# Patient Record
Sex: Male | Born: 1945 | Race: White | Hispanic: No | Smoking: Never smoker
Health system: Southern US, Community
[De-identification: ages and names within clinical notes are randomized; demographics above are authoritative.]

## PROBLEM LIST (undated history)

## (undated) DIAGNOSIS — I2510 Atherosclerotic heart disease of native coronary artery without angina pectoris: Secondary | ICD-10-CM

## (undated) DIAGNOSIS — C801 Malignant (primary) neoplasm, unspecified: Secondary | ICD-10-CM

## (undated) DIAGNOSIS — I219 Acute myocardial infarction, unspecified: Secondary | ICD-10-CM

## (undated) DIAGNOSIS — K279 Peptic ulcer, site unspecified, unspecified as acute or chronic, without hemorrhage or perforation: Secondary | ICD-10-CM

## (undated) DIAGNOSIS — G4731 Primary central sleep apnea: Secondary | ICD-10-CM

## (undated) DIAGNOSIS — I10 Essential (primary) hypertension: Secondary | ICD-10-CM

## (undated) DIAGNOSIS — M1990 Unspecified osteoarthritis, unspecified site: Secondary | ICD-10-CM

## (undated) DIAGNOSIS — T7840XA Allergy, unspecified, initial encounter: Secondary | ICD-10-CM

## (undated) DIAGNOSIS — Z86010 Personal history of colonic polyps: Secondary | ICD-10-CM

## (undated) DIAGNOSIS — I2699 Other pulmonary embolism without acute cor pulmonale: Secondary | ICD-10-CM

## (undated) DIAGNOSIS — K5732 Diverticulitis of large intestine without perforation or abscess without bleeding: Secondary | ICD-10-CM

## (undated) DIAGNOSIS — K219 Gastro-esophageal reflux disease without esophagitis: Secondary | ICD-10-CM

## (undated) DIAGNOSIS — E785 Hyperlipidemia, unspecified: Secondary | ICD-10-CM

## (undated) DIAGNOSIS — F32A Depression, unspecified: Secondary | ICD-10-CM

## (undated) DIAGNOSIS — F329 Major depressive disorder, single episode, unspecified: Secondary | ICD-10-CM

## (undated) DIAGNOSIS — Z860101 Personal history of adenomatous and serrated colon polyps: Secondary | ICD-10-CM

## (undated) HISTORY — DX: Personal history of adenomatous and serrated colon polyps: Z86.0101

## (undated) HISTORY — PX: JOINT REPLACEMENT: SHX530

## (undated) HISTORY — DX: Diverticulitis of large intestine without perforation or abscess without bleeding: K57.32

## (undated) HISTORY — DX: Essential (primary) hypertension: I10

## (undated) HISTORY — DX: Personal history of colonic polyps: Z86.010

## (undated) HISTORY — PX: PROSTATE SURGERY: SHX751

## (undated) HISTORY — DX: Other pulmonary embolism without acute cor pulmonale: I26.99

## (undated) HISTORY — DX: Malignant (primary) neoplasm, unspecified: C80.1

## (undated) HISTORY — DX: Allergy, unspecified, initial encounter: T78.40XA

## (undated) HISTORY — DX: Depression, unspecified: F32.A

## (undated) HISTORY — PX: CARDIAC CATHETERIZATION: SHX172

## (undated) HISTORY — DX: Peptic ulcer, site unspecified, unspecified as acute or chronic, without hemorrhage or perforation: K27.9

## (undated) HISTORY — DX: Acute myocardial infarction, unspecified: I21.9

## (undated) HISTORY — DX: Gastro-esophageal reflux disease without esophagitis: K21.9

## (undated) HISTORY — DX: Major depressive disorder, single episode, unspecified: F32.9

## (undated) HISTORY — PX: VASECTOMY: SHX75

## (undated) HISTORY — DX: Unspecified osteoarthritis, unspecified site: M19.90

## (undated) HISTORY — DX: Hyperlipidemia, unspecified: E78.5

## (undated) HISTORY — PX: HERNIA REPAIR: SHX51

## (undated) HISTORY — PX: UPPER GASTROINTESTINAL ENDOSCOPY: SHX188

## (undated) HISTORY — DX: Primary central sleep apnea: G47.31

---

## 1988-09-05 HISTORY — PX: CHOLECYSTECTOMY: SHX55

## 1998-07-26 ENCOUNTER — Ambulatory Visit: Admission: RE | Admit: 1998-07-26 | Discharge: 1998-07-26 | Payer: Self-pay | Admitting: Family Medicine

## 1998-09-25 ENCOUNTER — Ambulatory Visit: Admission: RE | Admit: 1998-09-25 | Discharge: 1998-09-25 | Payer: Self-pay | Admitting: Internal Medicine

## 1999-04-14 ENCOUNTER — Encounter: Payer: Self-pay | Admitting: Internal Medicine

## 1999-04-14 ENCOUNTER — Inpatient Hospital Stay (HOSPITAL_COMMUNITY): Admission: EM | Admit: 1999-04-14 | Discharge: 1999-04-15 | Payer: Self-pay | Admitting: *Deleted

## 1999-04-29 ENCOUNTER — Ambulatory Visit: Admission: RE | Admit: 1999-04-29 | Discharge: 1999-04-29 | Payer: Self-pay | Admitting: Internal Medicine

## 1999-05-07 DIAGNOSIS — K5732 Diverticulitis of large intestine without perforation or abscess without bleeding: Secondary | ICD-10-CM

## 1999-05-07 HISTORY — DX: Diverticulitis of large intestine without perforation or abscess without bleeding: K57.32

## 1999-05-26 ENCOUNTER — Ambulatory Visit (HOSPITAL_COMMUNITY): Admission: RE | Admit: 1999-05-26 | Discharge: 1999-05-26 | Payer: Self-pay | Admitting: Gastroenterology

## 2000-04-26 ENCOUNTER — Other Ambulatory Visit: Admission: RE | Admit: 2000-04-26 | Discharge: 2000-04-26 | Payer: Self-pay | Admitting: Urology

## 2000-11-21 ENCOUNTER — Encounter: Admission: RE | Admit: 2000-11-21 | Discharge: 2000-11-21 | Payer: Self-pay | Admitting: Internal Medicine

## 2000-11-21 ENCOUNTER — Encounter: Payer: Self-pay | Admitting: Internal Medicine

## 2001-07-17 ENCOUNTER — Encounter: Admission: RE | Admit: 2001-07-17 | Discharge: 2001-07-17 | Payer: Self-pay | Admitting: Rheumatology

## 2001-07-17 ENCOUNTER — Encounter: Payer: Self-pay | Admitting: Rheumatology

## 2002-04-08 ENCOUNTER — Encounter: Payer: Self-pay | Admitting: Orthopedic Surgery

## 2002-04-08 ENCOUNTER — Encounter: Admission: RE | Admit: 2002-04-08 | Discharge: 2002-04-08 | Payer: Self-pay | Admitting: Orthopedic Surgery

## 2003-11-30 ENCOUNTER — Ambulatory Visit (HOSPITAL_BASED_OUTPATIENT_CLINIC_OR_DEPARTMENT_OTHER): Admission: RE | Admit: 2003-11-30 | Discharge: 2003-11-30 | Payer: Self-pay | Admitting: Internal Medicine

## 2004-03-17 ENCOUNTER — Encounter: Admission: RE | Admit: 2004-03-17 | Discharge: 2004-03-17 | Payer: Self-pay | Admitting: *Deleted

## 2004-09-05 HISTORY — PX: OTHER SURGICAL HISTORY: SHX169

## 2005-01-05 ENCOUNTER — Ambulatory Visit: Payer: Self-pay | Admitting: Gastroenterology

## 2005-01-19 ENCOUNTER — Ambulatory Visit: Payer: Self-pay | Admitting: Gastroenterology

## 2006-03-09 ENCOUNTER — Ambulatory Visit (HOSPITAL_COMMUNITY): Admission: RE | Admit: 2006-03-09 | Discharge: 2006-03-09 | Payer: Self-pay | Admitting: Family Medicine

## 2006-03-09 ENCOUNTER — Encounter (INDEPENDENT_AMBULATORY_CARE_PROVIDER_SITE_OTHER): Payer: Self-pay | Admitting: *Deleted

## 2006-03-14 ENCOUNTER — Emergency Department (HOSPITAL_COMMUNITY): Admission: EM | Admit: 2006-03-14 | Discharge: 2006-03-15 | Payer: Self-pay | Admitting: Emergency Medicine

## 2006-11-02 ENCOUNTER — Ambulatory Visit: Payer: Self-pay | Admitting: Gastroenterology

## 2008-03-31 ENCOUNTER — Ambulatory Visit: Payer: Self-pay

## 2008-03-31 ENCOUNTER — Ambulatory Visit: Payer: Self-pay | Admitting: Cardiovascular Disease

## 2008-09-23 ENCOUNTER — Encounter: Payer: Self-pay | Admitting: Surgery

## 2008-09-23 IMAGING — CR DG CHEST 2V
3 series · 3 of 3 positions shown · non-contrast
Comparison: None available.

CLINICAL DATA: Preadmission respiratory film.

CHEST - 2 VIEW

[view not recorded (1 of 3)]
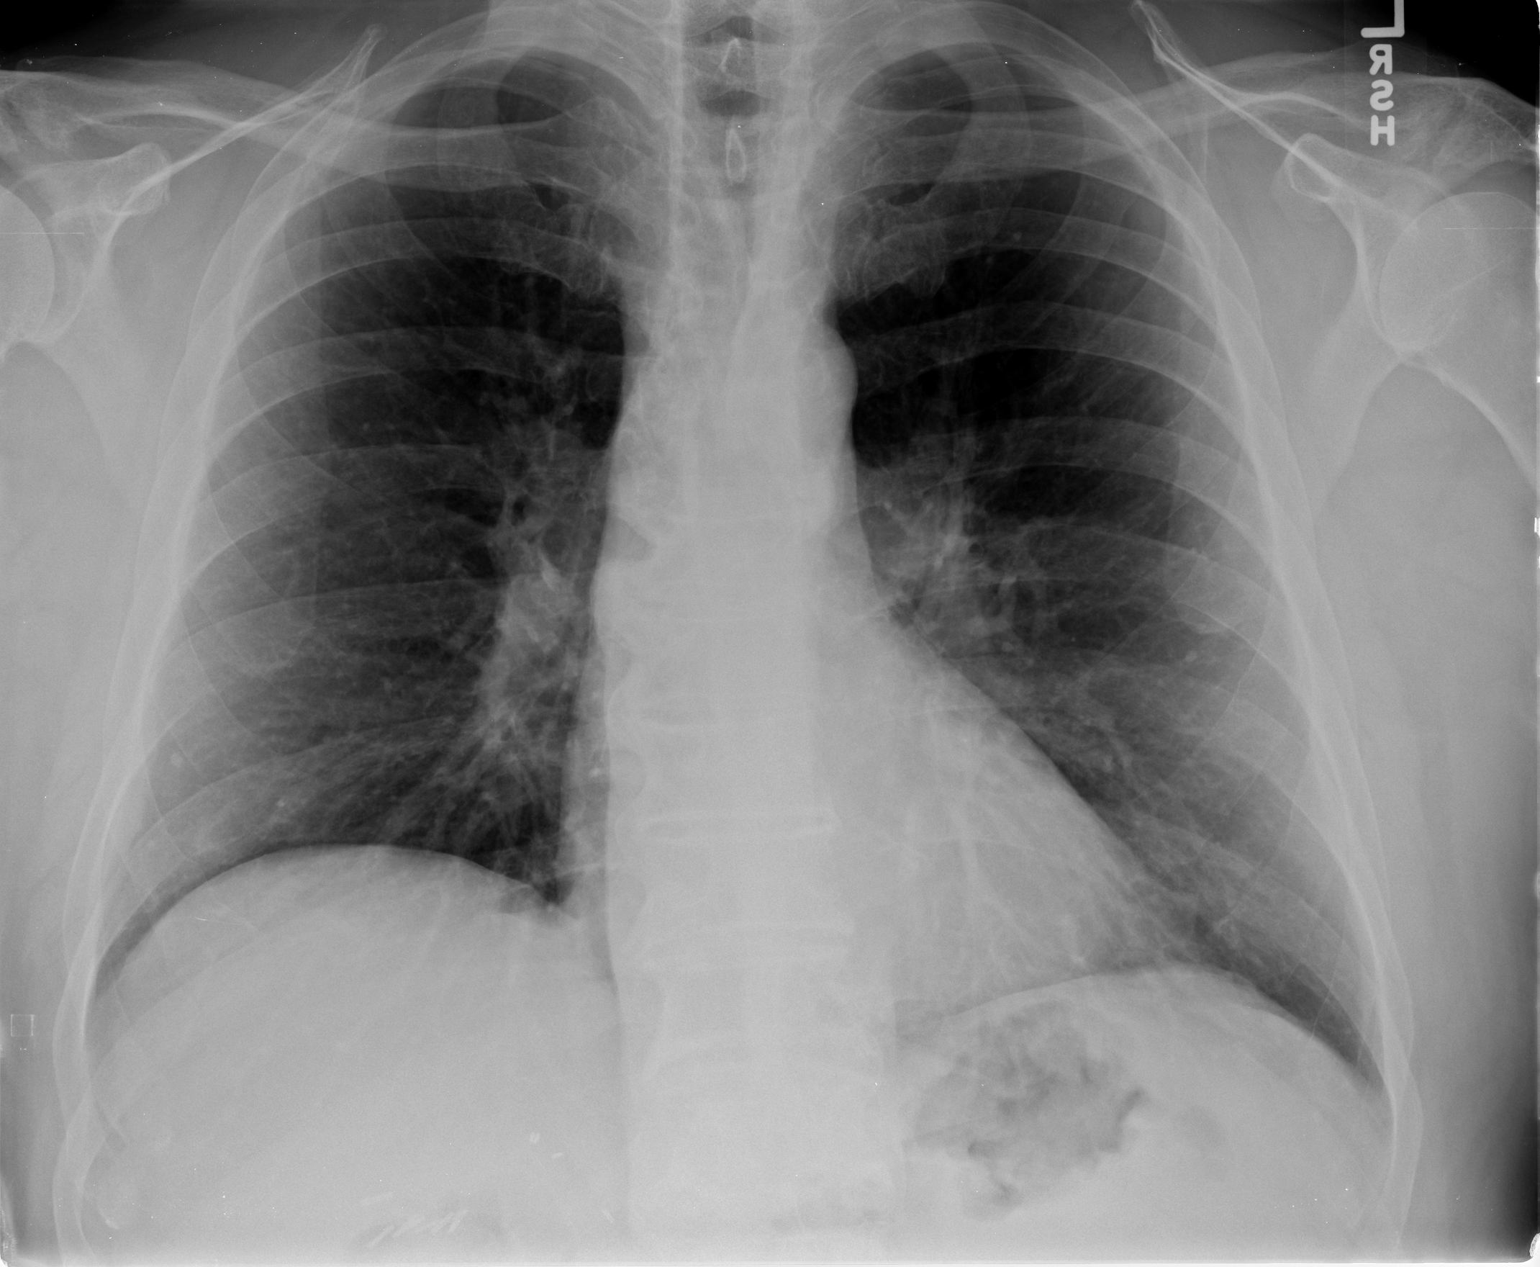

[view not recorded (2 of 3)]
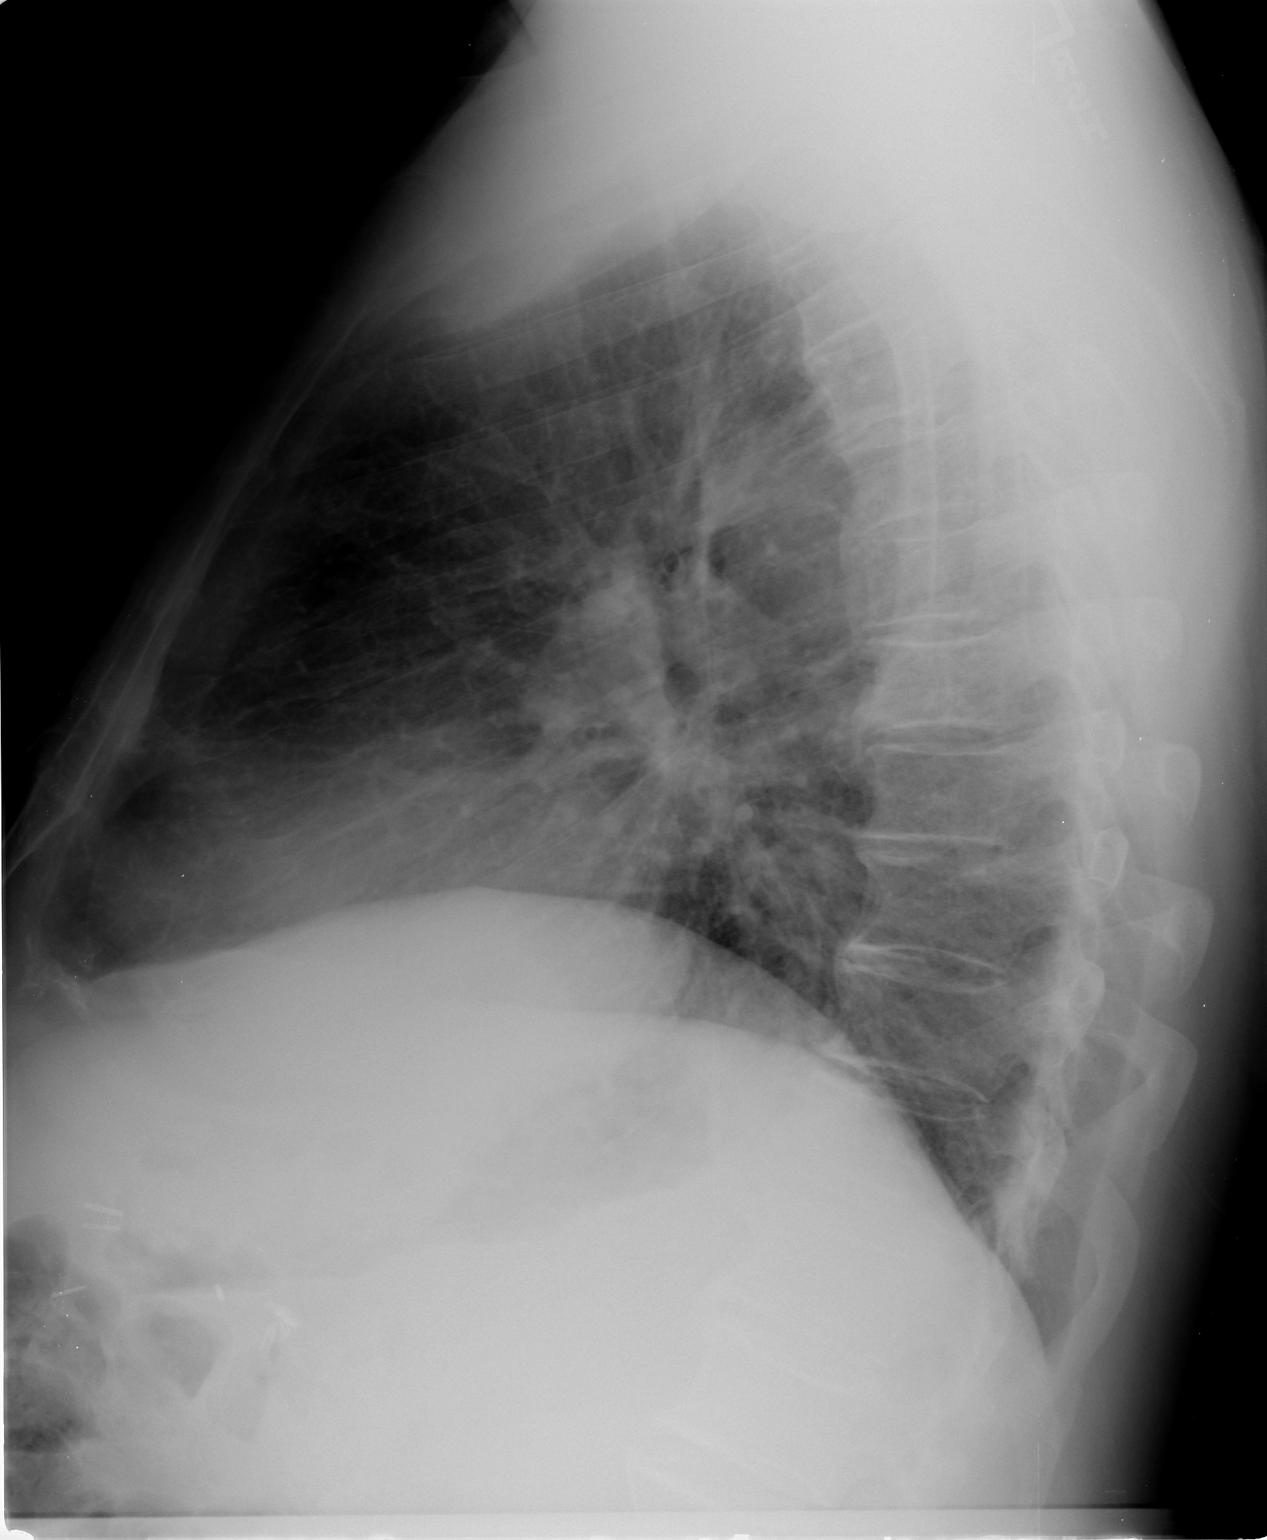

[view not recorded (3 of 3)]
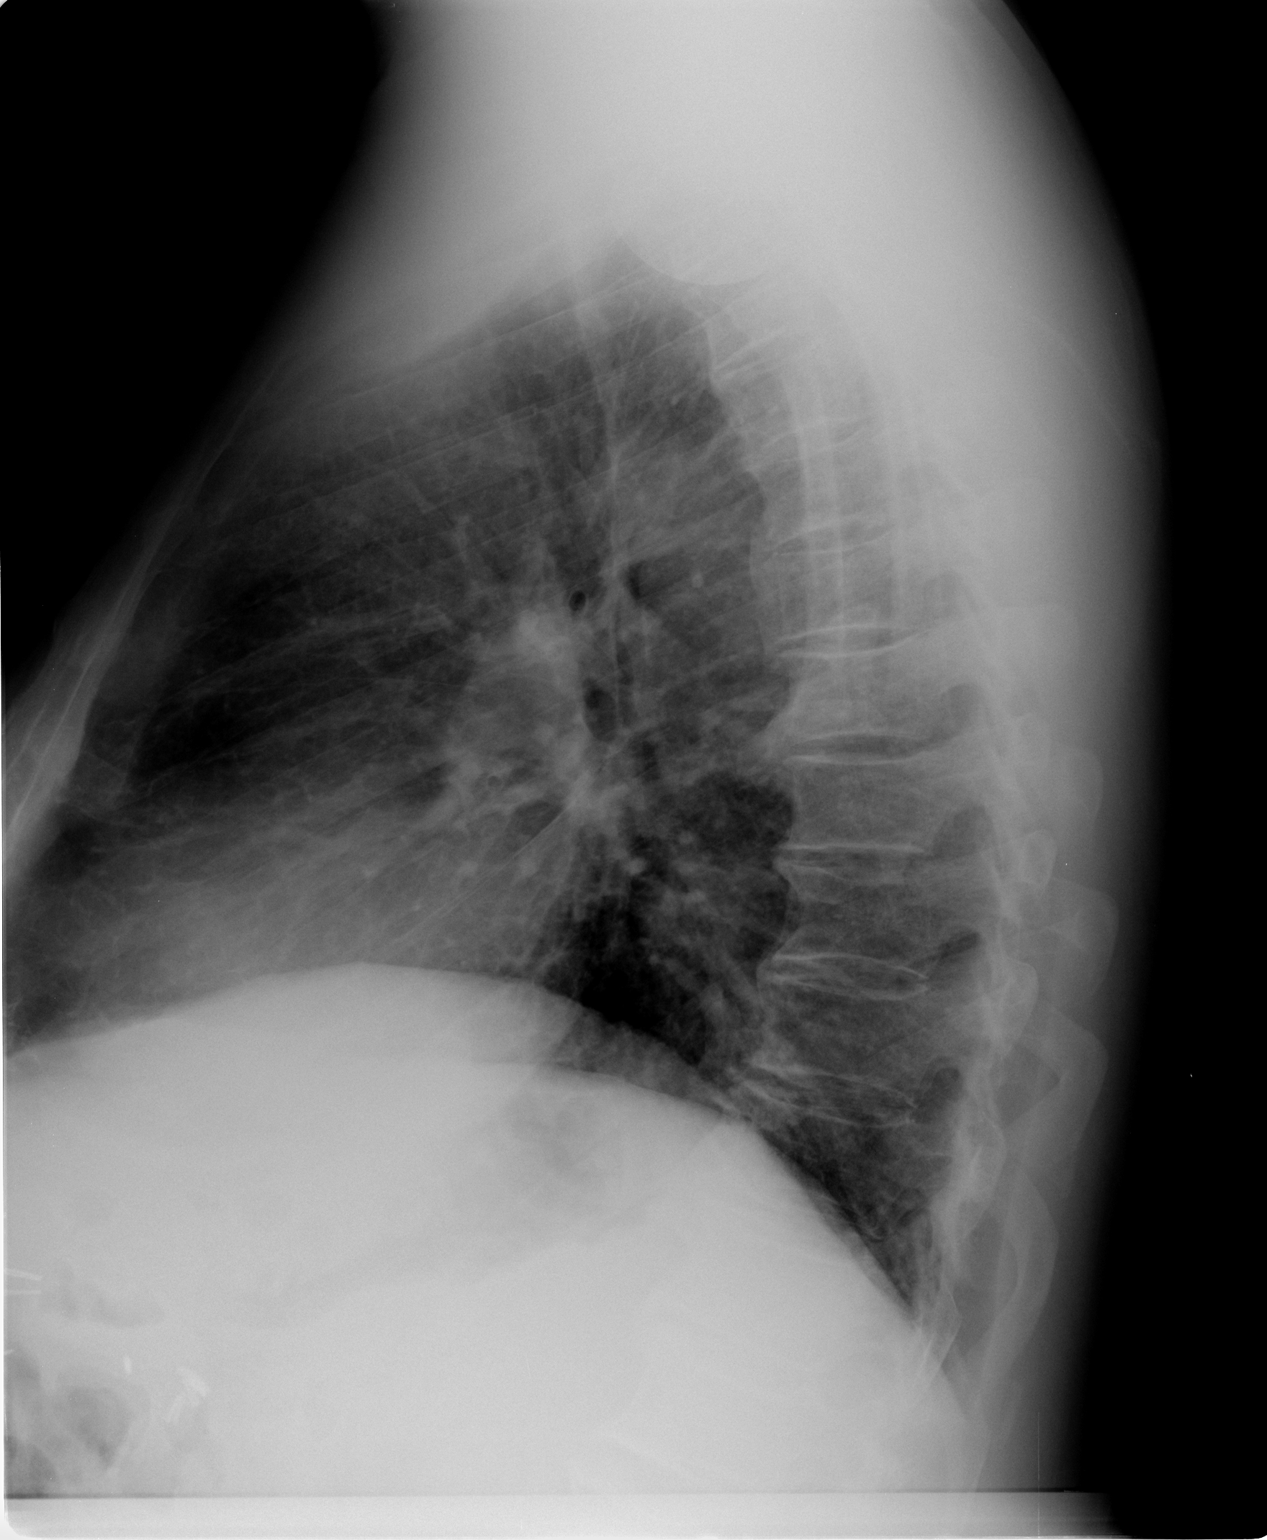

[3 of 3 positions shown; findings below may reference images not displayed]

FINDINGS: Lungs clear.  Heart size normal.  No pleural effusion or
focal bony abnormality.
IMPRESSION: No acute disease.

## 2008-09-24 ENCOUNTER — Ambulatory Visit (HOSPITAL_COMMUNITY): Admission: RE | Admit: 2008-09-24 | Discharge: 2008-09-25 | Payer: Self-pay | Admitting: Surgery

## 2008-09-24 ENCOUNTER — Encounter (INDEPENDENT_AMBULATORY_CARE_PROVIDER_SITE_OTHER): Payer: Self-pay | Admitting: Surgery

## 2008-10-08 ENCOUNTER — Encounter: Admission: RE | Admit: 2008-10-08 | Discharge: 2008-10-08 | Payer: Self-pay | Admitting: Family Medicine

## 2008-10-08 IMAGING — CT CT PARANASAL SINUSES LIMITED
1 series · 14 of 16 positions shown, 18 images · non-contrast
Comparison: None

CLINICAL DATA: Chronic sinusitis.

CT PARANASAL SINUS LIMITED WITHOUT CONTRAST

[Series 2: sinus · axial · 0.22mm/px · z∈[+264,+363]mm · 14 of 16 slices shown, 18 images]
[im 2/16  brain]
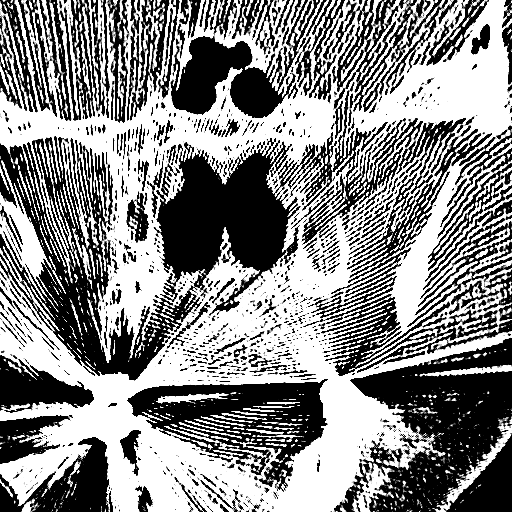
[im 2/16  bone]
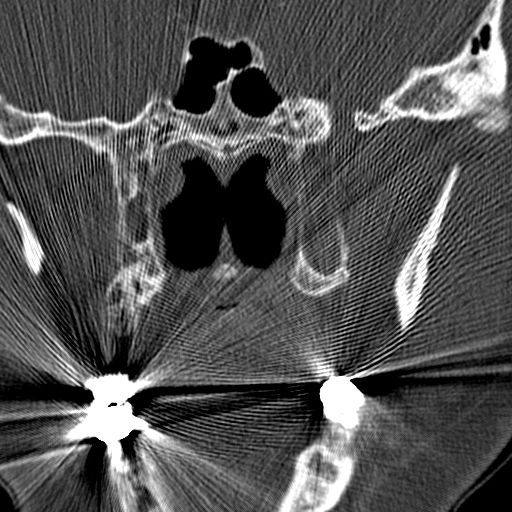
[im 3/16  bone]
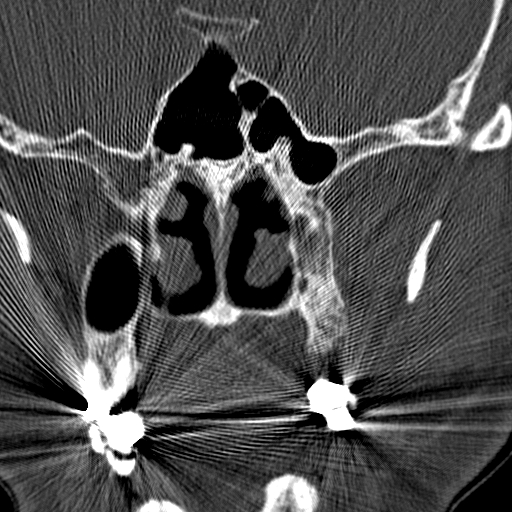
[im 4/16  bone]
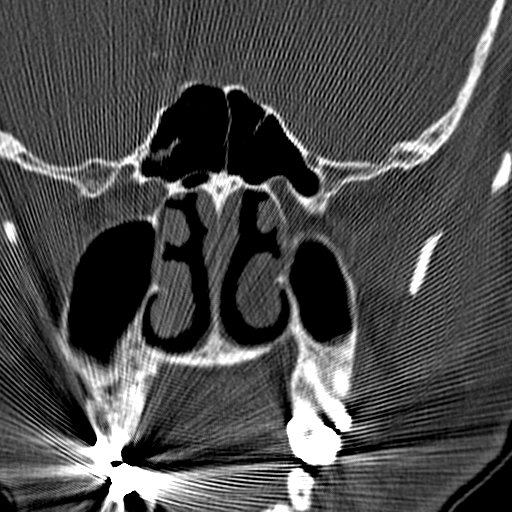
[im 5/16  bone]
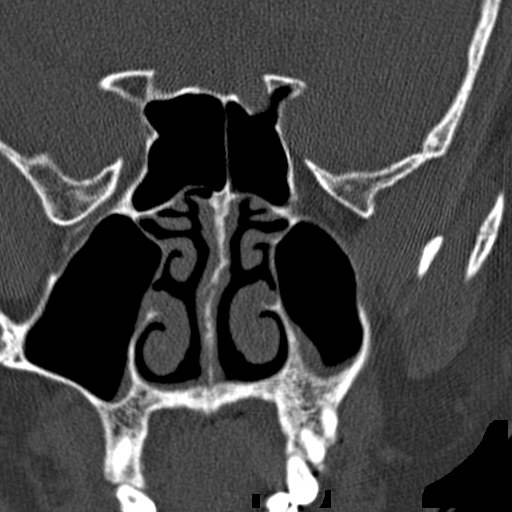
[im 6/16  brain]
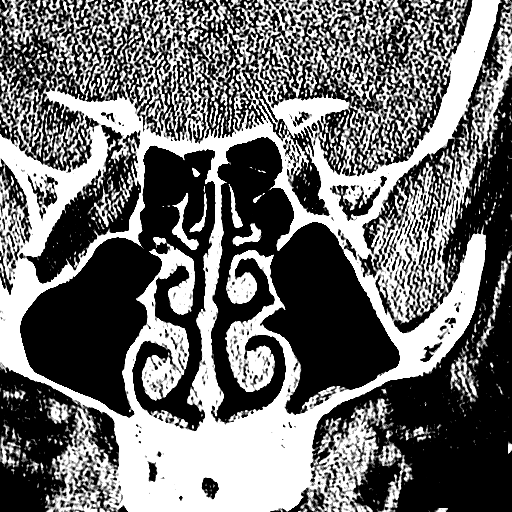
[im 6/16  bone]
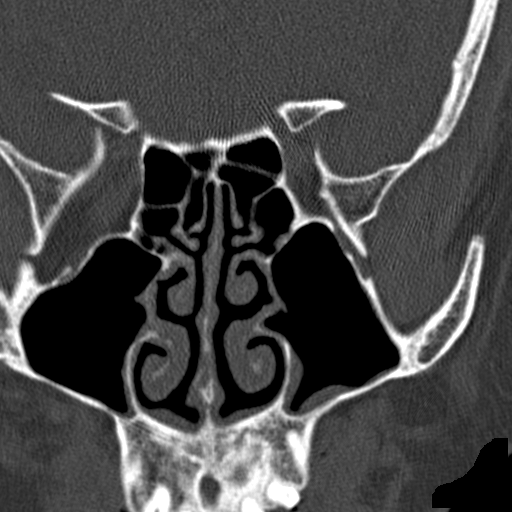
[im 7/16  bone]
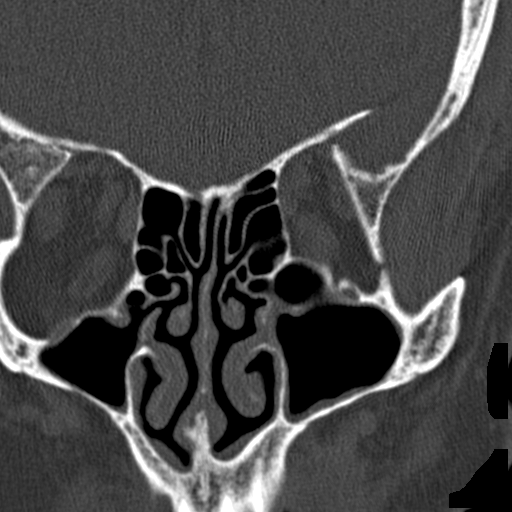
[im 8/16  bone]
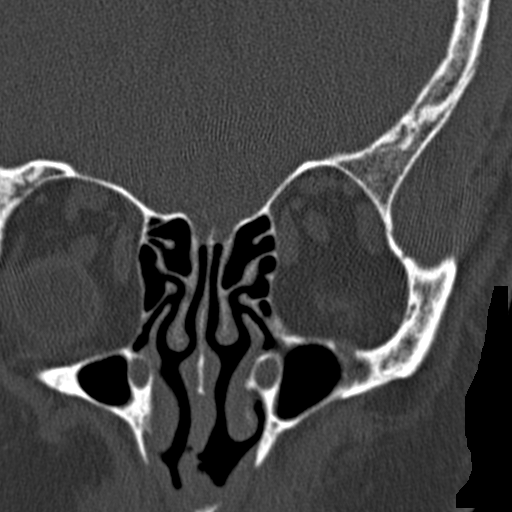
[im 9/16  bone]
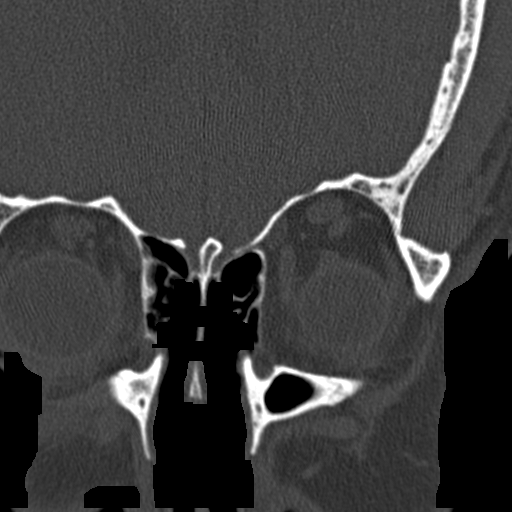
[im 10/16  brain]
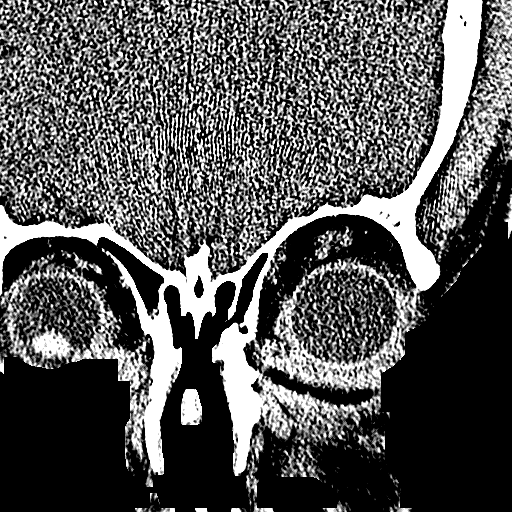
[im 10/16  bone]
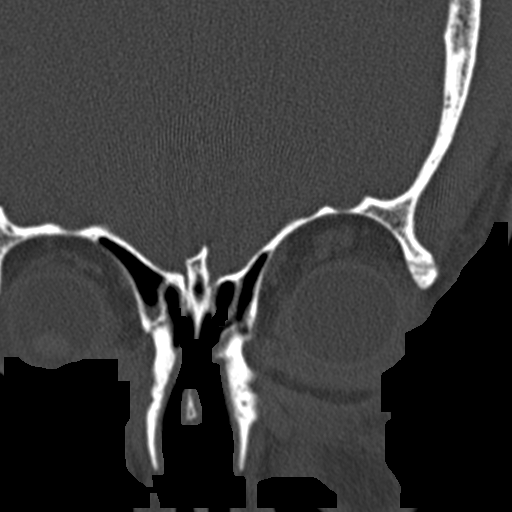
[im 11/16  bone]
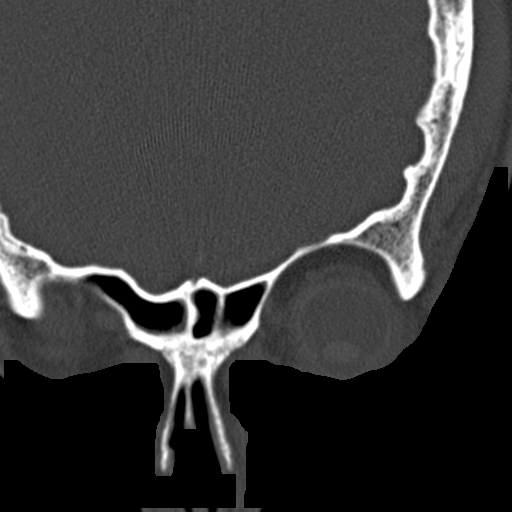
[im 12/16  bone]
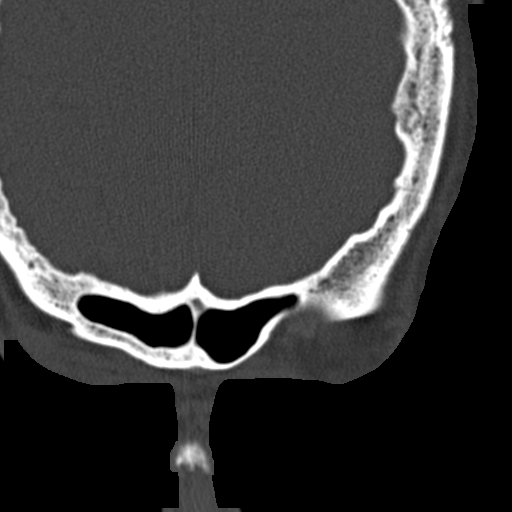
[im 13/16  bone]
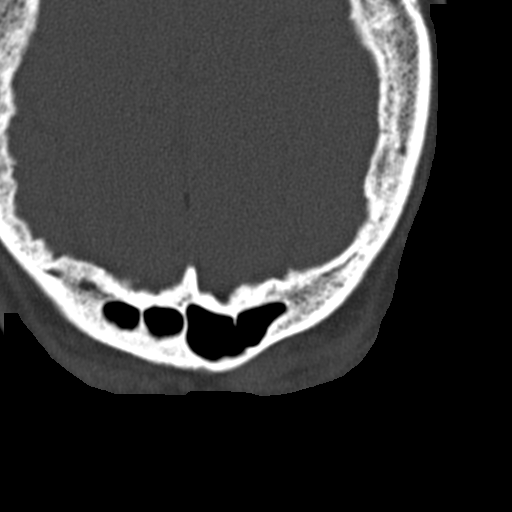
[im 14/16  brain]
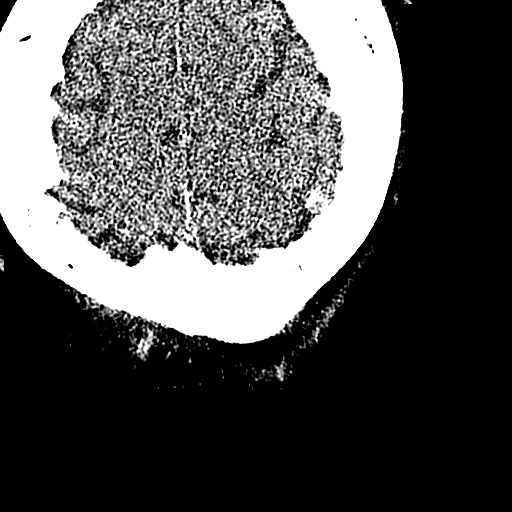
[im 14/16  bone]
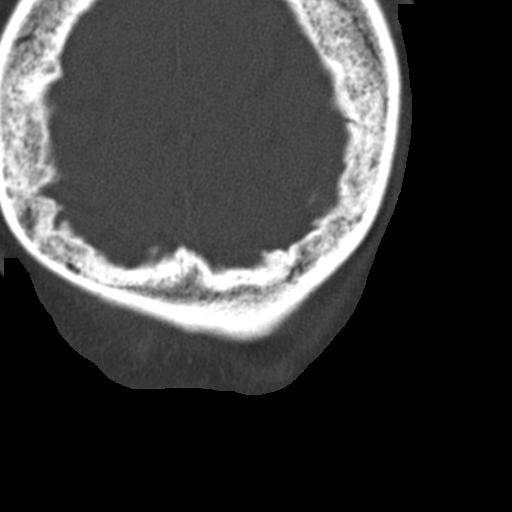
[im 15/16  bone]
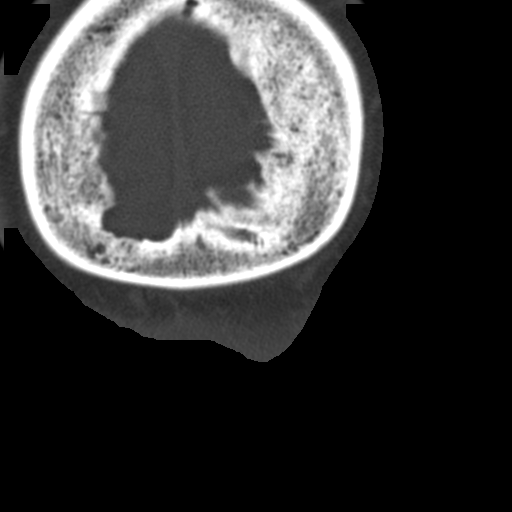

[14 of 16 positions shown; findings below may reference images not displayed]

FINDINGS: Frontal sinuses are clear except for very minimal mucosal
thickening in the right division.  Ethmoid sinuses are clear.
Maxillary sinuses show very minimal thickening along the floors,
left more than right.  No free fluid or pronounced disease.
Sphenoid sinuses clear.
IMPRESSION: No likely significant disease.  Very minimal mucosal thickening
along the floor of the right division of the frontal sinus and the
floors of both maxillary sinuses, left more than right.  No
pronounced mucosal thickening or free fluid in any sinus.

## 2009-12-11 ENCOUNTER — Encounter (INDEPENDENT_AMBULATORY_CARE_PROVIDER_SITE_OTHER): Payer: Self-pay | Admitting: *Deleted

## 2010-10-05 NOTE — Letter (Signed)
Summary: Colonoscopy Letter  [facility]  [address] [place], [place] [address]   Phone: [phone]  Fax: [phone]      December 11, 2009 MRN: [id number]   [facility] [address] [place], [place]  [address]   Dear John Blackburn,   According to your medical record, it is time for you to schedule a Colonoscopy. The American Cancer Society recommends this procedure as a method to detect early colon cancer. Patients with a family history of colon cancer, or a personal history of colon polyps or inflammatory bowel disease are at increased risk.  This letter has beeen generated based on the recommendations made at the time of your procedure. If you feel that in your particular situation this may no longer apply, please contact our office.  Please call our office at [phone] to schedule this appointment or to update your records at your earliest convenience.  Thank you for cooperating with [place] to provide you with the very best care possible.   Sincerely,    Vania Rea. Jarold Motto, M.D.  [facility] Gastro[facility]ology Division [phone]

## 2010-12-20 LAB — BASIC METABOLIC PANEL
BUN: 13 mg/dL (ref 6–23)
CO2: 30 mEq/L (ref 19–32)
Calcium: 9 mg/dL (ref 8.4–10.5)
Chloride: 102 mEq/L (ref 96–112)
Creatinine, Ser: 0.8 mg/dL (ref 0.4–1.5)

## 2010-12-20 LAB — CBC
HCT: 46.3 % (ref 39.0–52.0)
MCHC: 33.4 g/dL (ref 30.0–36.0)
MCV: 99.7 fL (ref 78.0–100.0)
Platelets: 271 10*3/uL (ref 150–400)
WBC: 8.5 10*3/uL (ref 4.0–10.5)

## 2011-01-18 NOTE — Procedures (Signed)
[facility]                              EXERCISE TREADMILL   NAME:WHITLEYMarland Kitchen John Blackburn                    MRN:          [id number]  DATE:03/31/2008                            DOB:          07/07/1946    EXERCISE STRESS TEST:   INDICATIONS:  Markedly positive family history.   The patient exercised 8 minutes on same Bruce protocol.  Exercise was  stopped due to fatigue and dyspnea.  Maximum heart rate is 126.  Peak  blood pressure was equal to 145/62.   Resting EKG showed sinus rhythm with nonspecific ST-T wave changes with  stress.  There was no significant ST-segment depression or arrhythmia.   IMPRESSION:  Negative exercise stress test at a reasonable workload.     Noralyn Pick. Eden Emms, MD, [facility]  Electronically Signed    PCN/MedQ  DD: 03/31/2008  DT: 04/01/2008  Job #: [id number]

## 2011-01-18 NOTE — Op Note (Signed)
NAME:  KERBY, BORNER NO.:  [id number]   MEDICAL RECORD NO.:  [id number]          PATIENT TYPE:  OIB   LOCATION:  5123                         FACILITY:  [facility]   PHYSICIAN:  Currie Paris, M.D.DATE OF BIRTH:  10-Jan-1946   DATE OF PROCEDURE:  09/24/2008  DATE OF DISCHARGE:                               OPERATIVE REPORT   PREOPERATIVE DIAGNOSIS:  Umbilical incisional hernia.   POSTOPERATIVE DIAGNOSIS:  Umbilical incisional hernia.   OPERATION:  Repair of umbilical incisional hernia with mesh.   SURGEON:  Currie Paris, MD   ANESTHESIA:  General.   CLINICAL HISTORY:  This is a 65 year old gentleman who has an umbilical  hernia at the site of his incision from a prior cholecystectomy done in  1998.  He desired to have this repaired as this was getting larger and  he was starting to get some redness and thinning out of the overlying  skin as well as some discomfort.   DESCRIPTION OF PROCEDURE:  The patient was seen in the holding area, and  he had no further questions.  We confirmed surgical plans as noted  above.  Because of his history of sleep apnea and use of a CPAP machine,  we had elected to do him and keep him overnight from observation.   The patient was taken to the operating room.  After satisfactory general  (LMA) anesthesia had been obtained, the periumbilical area was clipped,  prepped and draped.  The time-out was done.   To help with postop pain relief, I infiltrated 0.25% plain Marcaine  around the umbilicus.  I made a curvilinear incision using the prior  scar from his laparoscopic cholecystectomy.  I entered the hernia sac  and dissected off of the cautery from the subcu skin and the surrounding  subcutaneous tissues and excised it.  There had been a little omentum  protruding through this defect.   The defect was no more than 1.5 cm in diameter.  Once I had this  identified, I freed up the subcutaneous tissue off the fascia for  a  distance of about 2 cm in all directions around the defect.   I put more Marcaine in and then closed the defect with 2 sutures of #1  Novofil.  I then took a piece of Marlex mesh and cut it into a circle.  I anchored it to the repair with one of the Prolene sutures that I had  used to close the defect.  I then anchored it circumferentially with 0  Prolenes.   Everything appeared dry after we irrigated.  I used a 2-0 Vicryl to  close the subcu, 4-0 Monocryl subcuticular on the skin with some cotton  ball and mineral oil in the umbilicus to keep it as a NE.  The patient  tolerated the procedure well, and there were no complications.  All  counts were correct.      Currie Paris, M.D.  Electronically Signed     CJS/MEDQ  D:  09/24/2008  T:  09/24/2008  Job:  [id number]   cc:   Dr[facility] Dallas Schimke [facility]

## 2011-01-21 NOTE — Assessment & Plan Note (Signed)
[facility]                         GASTROENTEROLOGY OFFICE NOTE   NAME:John Blackburn, John Blackburn                    MRN:          [id number]  DATE:11/02/2006                            DOB:          Apr 18, 1946    Mr. Barrows is a 65 year old white male, with hypertension and  hyperlipidemia, self-referred today for followup colonoscopy because of  a very strong family history of multiple family members with colon  cancer.  He was nice enough to supply a family tree chart for our  records.  There are multiple cases of colon carcinoma in first and  second and third degree relatives.  His sister was evaluated at [facility] and it was felt that he possibly had  familial colorectal cancer type X.  Testing for ordinary Lynch syndrome  was negative.   Indeed, he continues to have bowel irregularity and has had some  intermittent bright red blood per rectum.  He has had colonoscopies  going back to 1990 with his last exam 3 years ago.  He denies abdominal  pain but does have rather marked acid reflux occurring at least twice a  week and he is taking over-the-counter baking soda.  He denies  dysphagia.  He has never had endoscopy exam.  His appetite is good, his  weight is stable.  He has never had any hepatobiliary problems, known  hepatitis or pancreatitis.  He denies any significant weight changes or  food intolerances.   PAST MEDICAL HISTORY:  Is somewhat involved and includes:  1. Essential hypertension.  2. Hypercholesterolemia.  3. 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  4. Mild depression.  5. Sleep apnea.  6. Previous cholecystectomy.   FAMILY HISTORY:  Remarkable for multiple family members with colon  cancer, now included in his Family Tree chart in his record.   SOCIAL HISTORY:  The patient is divorced and lives alone.  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  He denies use of  alcohol or cigarettes.   REVIEW OF SYSTEMS:  Remarkable for minor problems including fatigue,  depression, anxiety, chronic back pain, and a variety of arthralgias and  myalgias.  He denies any current cardiovascular or pulmonary complaints.   MEDICATIONS:  Atenolol - HCTZ 50-25 mg a day.   HE, IN THE PAST, HAD REACTIONS TO CEPHALEXIN.   EXAMINATION:  A healthy appearing white male appearing younger than his  stated age.  He is 6 feet 3 inches tall and weighs 270 pounds.  Blood  pressure is 142/76 and pulse was 68 and regular.  I could not appreciate  stigmata of chronic liver disease.  CHEST:  Clear.  There were no murmurs, gallops or rubs.  He appeared to  be in a regular rhythm.  I could not appreciate hepatosplenomegaly, abdominal masses or  tenderness.  Bowel sounds were normal.  UPPER EXTREMITIES:  No edema or phlebitis.  MENTAL STATUS:  Clear.  RECTAL:  Deferred.   ASSESSMENT:  1. Very strong family history of colon  carcinoma with possible      colorectal genetic cancer syndrome.  2. Acid reflux with need for screening endoscopy to rule out Barrett's      mucosa.  3. History of sleep apnea.  4. Status post cholecystectomy.  5. Severe degenerative arthritis with bilateral knee replacements.  6. Well controlled essential hypertension.  7. History of chronic depression.   RECOMMENDATIONS:  1. Outpatient colonoscopy and endoscopy at his convenience.  2. Check CBC and iron studies.  3. Continue other medications as listed above.     Vania Rea. Jarold Motto, MD, [facility], FAGA  Electronically Signed    DRP/MedQ  DD: 11/02/2006  DT: 11/02/2006  Job #: [id number]   cc:   Avelino Leeds, M.D.

## 2011-04-11 ENCOUNTER — Telehealth: Payer: Self-pay | Admitting: *Deleted

## 2011-04-11 NOTE — Telephone Encounter (Signed)
Patient is due for repeat colonoscopy I have left a message to call back.

## 2011-04-20 NOTE — Telephone Encounter (Signed)
Pt has a voicemail that is not set up now, We will mail him a reminder letter.

## 2011-04-28 ENCOUNTER — Ambulatory Visit: Payer: Self-pay | Admitting: Internal Medicine

## 2011-05-03 ENCOUNTER — Ambulatory Visit (INDEPENDENT_AMBULATORY_CARE_PROVIDER_SITE_OTHER): Payer: Medicare Other | Admitting: Internal Medicine

## 2011-05-03 ENCOUNTER — Encounter: Payer: Self-pay | Admitting: Internal Medicine

## 2011-05-03 DIAGNOSIS — F32A Depression, unspecified: Secondary | ICD-10-CM

## 2011-05-03 DIAGNOSIS — M129 Arthropathy, unspecified: Secondary | ICD-10-CM

## 2011-05-03 DIAGNOSIS — K219 Gastro-esophageal reflux disease without esophagitis: Secondary | ICD-10-CM

## 2011-05-03 DIAGNOSIS — G4731 Primary central sleep apnea: Secondary | ICD-10-CM

## 2011-05-03 DIAGNOSIS — I10 Essential (primary) hypertension: Secondary | ICD-10-CM

## 2011-05-03 DIAGNOSIS — M1990 Unspecified osteoarthritis, unspecified site: Secondary | ICD-10-CM

## 2011-05-03 DIAGNOSIS — E785 Hyperlipidemia, unspecified: Secondary | ICD-10-CM

## 2011-05-03 DIAGNOSIS — F329 Major depressive disorder, single episode, unspecified: Secondary | ICD-10-CM

## 2011-05-03 NOTE — Progress Notes (Signed)
Subjective:    Patient ID: John Blackburn, male    DOB: May 19, 1946, 65 y.o.   MRN: [id number]  HPI John Blackburn presents to establish for on-going primary care. He had been going to Urgent Care - seeing [facility] [place].   He has had hoarseness for several years. He did see an ENT, [place], who noted vocal chord nodules at nasopharyngoscopic exam. He also has a problem with nasal drainage from allergy previously treated with flonase.   Patient does have sleep apnea and does use CPAP. He has seen Dr. Maple [place] in the past. He does not feel rested in the AM and feels that he needs reevaluation.   He is being treated for depression with pristiq, wellbutrin and xanax. He has been followed by Dr. Dub Mikes through the psychology department at [facility]. He has had the dissolution of a relationship and he has been "thrown for a loop." He is advised to start with counseling at [facility]. 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   He has also had a problem with erectile dysfunction. He was been on viagra in the past. This may be tied to the emotional issues related to the above.  Past Medical History  Diagnosis Date  . 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 Depression     on multiple meds. Has been seen at [facility]. and Dr. Dub Mikes is his prescriber  . Diverticulitis of colon 2000 's    treated as an outpatient  . GERD (gastroesophageal reflux disease)     UGI done August '12 - ulcer/. Dr. Noe Gens, gastroenterologist in [facility].   . Peptic ulcer     in the past and just recently-August '12  . Hypertension   . Hyperlipidemia     last lipid panel: HDL 44, LDL 117  . Hx of adenomatous colonic polyps   . Sleep apnea, primary central    Past Surgical History  Procedure Date  . Cholecystectomy 1990    laproscopic   . Tkr bilateral  2006    Dr. Erskine Squibb   Family History  Problem Relation Age of Onset  . Stroke Mother   . Hypertension Mother   . Heart disease Mother   . Heart disease Father     CAD/MI-fatal sudden death  . Cancer Sister     colon cancer 20090-survivor  . Heart disease Paternal Uncle   . Heart disease Paternal Grandfather    History   Social History  . Marital Status: Divorced    Spouse Name: N/A    Number of Children: 3  . Years of Education: 16   Occupational History  . contractor    Social History Main Topics  . Smoking status: Never Smoker   . Smokeless tobacco: Former Neurosurgeon  . Alcohol Use: 0.5 oz/week    1 drink(s) per week  . Drug Use: No  . Sexually Active: Not Currently -- Male partner(s)   Other Topics Concern  . 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 3 sons - '74, '76, '87.  1 grandchild.Work - Chief Executive Officer. Lives alone. No pets. Serially monogamous.ACP - he has a living will: DNR, DNI.   '   Review of Systems Review of Systems  Constitutional:  Negative for fever, chills, activity change and unexpected weight change-20lbs.  HEENT:  Positive for hearing loss and he has had testing - needs amplification.Negative  ear pain, congestion, neck stiffness and postnasal drip. Negative for sore throat or swallowing problems. Negative for dental complaints.   Eyes: Negative for vision loss or change in visual acuity.  Respiratory: Negative for chest tightness and wheezing.   Cardiovascular: Positive for chest pain which may be reflux. He has no exertional chest pain (works out on Games developer). No decreased exercise tolerance Gastrointestinal: change in bowel habit - mild constipation. No bloating or gas. No reflux or indigestion Genitourinary: Negative for urgency, frequency, flank pain and difficulty urinating. Has had several episodes of urinary incontinence. Trouble  emptying bladder. Decreased force of stream. Has post void dribble. Nocturia 1-2. Musculoskeletal: Negative for myalgias, back pain, arthralgias and gait problem.  Neurological: Negative for dizziness, tremors, weakness and headaches.  Hematological: Negative for adenopathy.  Psychiatric/Behavioral: Negative for behavioral problems. Positive for  dysphoric mood.       Objective:   Physical Exam Vitals reviewed - mild elevation SBP, overweight Gen'l - well nourished white man in no distress. He does appear overweight HEENT - C&S clear, PERRLA Neck - supple Chest- Clear to auscultation Cor - 2+ radial pulse, RRR Neuro - alert and oriented x 3, CN II-XII grossly intact, normal gait. Psych - appears stable, not agitated. Good historian. He does get sad talking about his situation.       Assessment & Plan:

## 2011-05-04 DIAGNOSIS — K219 Gastro-esophageal reflux disease without esophagitis: Secondary | ICD-10-CM | POA: Insufficient documentation

## 2011-05-04 DIAGNOSIS — F329 Major depressive disorder, single episode, unspecified: Secondary | ICD-10-CM | POA: Insufficient documentation

## 2011-05-04 DIAGNOSIS — G4733 Obstructive sleep apnea (adult) (pediatric): Secondary | ICD-10-CM | POA: Insufficient documentation

## 2011-05-04 DIAGNOSIS — E785 Hyperlipidemia, unspecified: Secondary | ICD-10-CM | POA: Insufficient documentation

## 2011-05-04 DIAGNOSIS — M1990 Unspecified osteoarthritis, unspecified site: Secondary | ICD-10-CM | POA: Insufficient documentation

## 2011-05-04 DIAGNOSIS — I10 Essential (primary) hypertension: Secondary | ICD-10-CM | POA: Insufficient documentation

## 2011-05-04 NOTE — Assessment & Plan Note (Signed)
Patient with an exacerbation of his depression related to a failed relationship. He denies suicidal ideation.  Plan - he is to contact [facility] dept. Of Psychology to start counseling -  He is to call today or tomorrow.           Continue present medications - Dr Dub Mikes will be responsible for any change in medication.

## 2011-05-04 NOTE — Assessment & Plan Note (Signed)
BP Readings from Last 3 Encounters:  05/03/11 142/86   MIld elevation today. No change in medication.  Plan - home monitoring of BP. Bring results to next OV.

## 2011-05-04 NOTE — Assessment & Plan Note (Signed)
Will need to have follow-up lab with recommendations to follow.

## 2011-05-04 NOTE — Assessment & Plan Note (Signed)
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 He feels CPAP is no longer working for him and requests retesting.  Plan - refer back to Dr. Maple [place] who has seen him in the past. Dr. Maple [place] will determine what type of retesting he will need.

## 2011-05-10 ENCOUNTER — Telehealth: Payer: Self-pay

## 2011-05-10 MED ORDER — FLUTICASONE PROPIONATE 50 MCG/ACT NA SUSP: 2.0000 | Freq: Every day | NASAL | Status: DC

## 2011-05-10 NOTE — Telephone Encounter (Signed)
Pt called stating that at last OV he was advised that Rx for Flonase would be sent to [facility]. This medication is not on med list, please advise.

## 2011-05-10 NOTE — Telephone Encounter (Signed)
Ok for flonase nasal spray - one spray to each nostril once a day for allergy

## 2011-05-31 ENCOUNTER — Ambulatory Visit (INDEPENDENT_AMBULATORY_CARE_PROVIDER_SITE_OTHER): Payer: Medicare Other | Admitting: Internal Medicine

## 2011-05-31 DIAGNOSIS — I10 Essential (primary) hypertension: Secondary | ICD-10-CM

## 2011-05-31 DIAGNOSIS — R21 Rash and other nonspecific skin eruption: Secondary | ICD-10-CM

## 2011-05-31 DIAGNOSIS — E785 Hyperlipidemia, unspecified: Secondary | ICD-10-CM

## 2011-05-31 DIAGNOSIS — J370 Chronic laryngitis: Secondary | ICD-10-CM

## 2011-05-31 DIAGNOSIS — F329 Major depressive disorder, single episode, unspecified: Secondary | ICD-10-CM

## 2011-05-31 DIAGNOSIS — N400 Enlarged prostate without lower urinary tract symptoms: Secondary | ICD-10-CM

## 2011-05-31 DIAGNOSIS — N529 Male erectile dysfunction, unspecified: Secondary | ICD-10-CM

## 2011-05-31 DIAGNOSIS — G4731 Primary central sleep apnea: Secondary | ICD-10-CM

## 2011-05-31 MED ORDER — HYDROCHLOROTHIAZIDE 25 MG PO TABS: 25.0000 mg | ORAL_TABLET | Freq: Every day | ORAL | Status: DC

## 2011-05-31 MED ORDER — CLOTRIMAZOLE-BETAMETHASONE 1-0.05 % EX CREA: TOPICAL_CREAM | CUTANEOUS | Status: AC

## 2011-05-31 NOTE — Progress Notes (Signed)
Subjective:    Patient ID: John Blackburn, male    DOB: 1945/09/16, 65 y.o.   MRN: [id number]  HPI Mr. Whitely presents for follow-up. He has received a copy of the H&P: he has no additions or corrections.  Blood pressure continues to be poorly controlled on doxazosin.  His hx suggests this was started for BPH. Informed him the availability of other products to manage his BP.  Derm - he reports he has had "jock itch" an erythematous rash in groin. He has consulted dermatology but does not recall waht he was instructed to use for this rash or the diagnosis.  ED has been a problem for several years. He has difficulty maintaining an erection to completion of intercourse. He has less trouble with masturbation. He is not currently in a relationship.  He adds to his h/o chronic laryngitis. 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  In the interval since his last visit he has reestablished with a psychologist at [facility]. He has found this to be helpful. His mood is improved but anorexia and fatigue persist.   OSA - established by sleep study. He does use CPAP and sleeps through the night.   Review of Systems  Constitutional: Positive for appetite change (weight loss - over 10 lbs).  HENT: Positive for congestion.   Eyes: Negative.   Respiratory: Negative for apnea, cough and wheezing.        Hoarseness to his voice  Cardiovascular: Negative.   Gastrointestinal:       Heartburn and reflux  Genitourinary: Positive for urgency, frequency and difficulty urinating (decreased force of stream, nocturia x 2, dribbling).  Musculoskeletal: Negative.   Neurological: Negative.   Hematological: Negative.   Psychiatric/Behavioral: Positive for dysphoric mood (vegative depression).       Objective:   Physical Exam (exam per Adalberto Ill MSIV) Vital signs reviewed Gen'l: Well nourished well developed white  male in no acute distress  HEENT: Head: Normocephalic and atraumatic. Right Ear: External ear normal. EAC/TM nl. Left Ear: External ear normal.  EAC/TM nl. Nose: Nose normal. Mouth/Throat: Oropharynx is clear and moist. Dentition - native, in good repair. No buccal or palatal lesions. Posterior pharynx clear. Eyes: Conjunctivae and sclera clear. EOM intact. Pupils are equal, round, and reactive to light. Right eye exhibits no discharge. Left eye exhibits no discharge. Fundi not visulaized Neck: Normal range of motion. Neck supple. No JVD present. No tracheal deviation present. No thyromegaly present.  Cardiovascular: Normal rate, regular rhythm, no gallop, no friction rub, no murmur heard.      Quiet precordium. 2+ radial and DP pulses . No carotid bruits Pulmonary/Chest: Effort normal. No respiratory distress or increased WOB, no wheezes, no rales. No chest wall deformity or CVAT. Abdominal: Soft. Bowel sounds are normal in all quadrants. He exhibits no distension, no tenderness, no rebound or guarding, No heptosplenomegaly  Genitourinary:  deferred Musculoskeletal: Normal range of motion. He exhibits no edema and no tenderness.       Small and large joints without redness, synovial thickening or deformity. Full range of motion preserved about all small, median and large joints.  Lymphadenopathy:    He has no cervical or supraclavicular adenopathy.  Neurological: He is alert and oriented to person, place, and time. CN II-XII intact. DTRs 2+ and symmetrical biceps, radial and patellar tendons. Cerebellar function normal with no tremor, rigidity, normal gait and station.  Skin: Skin is warm and dry. No rash noted. No erythema.  Psychiatric: He has a normal mood and affect. His behavior is normal. Thought content normal.         Assessment & Plan:

## 2011-05-31 NOTE — Patient Instructions (Signed)
Blood pressure - too high. Plan - start HCTZ 25 mg once a day. Stop the doxazosin. Recheck in 2 weeks at office visit  BPH (enlarged prostate) - stop doxazosin. Report back in two weeks:  If have increased symptoms will address with other medication  Depression - continue at [facility]. Will continue present medications.  Jock itch - sounds like tinea cruis - trial of lotrisone applied to dry skin twice day.   ED - lets wait and see how it goes when you are next in a relationship.  Cholesterol - the outside studies look good - LDL cholesterol is at goal being less than 130. No need for medication.

## 2011-06-01 DIAGNOSIS — R21 Rash and other nonspecific skin eruption: Secondary | ICD-10-CM | POA: Insufficient documentation

## 2011-06-01 DIAGNOSIS — J370 Chronic laryngitis: Secondary | ICD-10-CM | POA: Insufficient documentation

## 2011-06-01 DIAGNOSIS — N400 Enlarged prostate without lower urinary tract symptoms: Secondary | ICD-10-CM | POA: Insufficient documentation

## 2011-06-01 DIAGNOSIS — N529 Male erectile dysfunction, unspecified: Secondary | ICD-10-CM | POA: Insufficient documentation

## 2011-06-01 NOTE — Assessment & Plan Note (Signed)
Patient reports he is sleeping well.  Plan - per Dr. Maple [place].

## 2011-06-01 NOTE — Assessment & Plan Note (Signed)
Difficult to isolate origin of ED: psychogenic vs organic. There are many reasons for the former. He is educated as to these possibilities.  Plan - Continue with treatment for depression            Can consider medication when he is next in a relationship

## 2011-06-01 NOTE — Assessment & Plan Note (Signed)
ENT suggested GERD as possible precipitating factor.  Plan - continue GERD treatment           For any change in symptoms may require follow-up ENT consult.

## 2011-06-01 NOTE — Assessment & Plan Note (Signed)
BP Readings from Last 3 Encounters:  05/31/11 158/90  05/03/11 142/86   Poor control.  Plan - start HCTZ 25 mg qd.             DC doxazosin           BP check 2 weeks.

## 2011-06-01 NOTE — Assessment & Plan Note (Signed)
Limited cardiac risk profile: goal per NCEP-ATPIII LDL 130 or less. He is below goal.

## 2011-06-01 NOTE — Assessment & Plan Note (Signed)
Patinet with persistent symptoms despite use of doxazosin.  Plan - stop Doxazosin            Monitor symptoms             If symptoms persistent trial of tamsulosin

## 2011-06-01 NOTE — Assessment & Plan Note (Signed)
Probable T cruris  Plan - trial of lotrisone bid

## 2011-06-01 NOTE — Assessment & Plan Note (Signed)
Making progress , back into counseling  Plan - continue present meds and counseling           Increase physical activity.

## 2011-06-17 ENCOUNTER — Institutional Professional Consult (permissible substitution): Payer: Medicare Other | Admitting: Internal Medicine

## 2011-06-23 ENCOUNTER — Ambulatory Visit: Payer: Medicare Other | Admitting: Internal Medicine

## 2011-06-24 ENCOUNTER — Ambulatory Visit (INDEPENDENT_AMBULATORY_CARE_PROVIDER_SITE_OTHER): Payer: Medicare Other | Admitting: Internal Medicine

## 2011-06-24 VITALS — BP 160/84 | HR 78 | Temp 98.1°F | Wt 235.0 lb

## 2011-06-24 DIAGNOSIS — N529 Male erectile dysfunction, unspecified: Secondary | ICD-10-CM

## 2011-06-24 DIAGNOSIS — N400 Enlarged prostate without lower urinary tract symptoms: Secondary | ICD-10-CM

## 2011-06-24 DIAGNOSIS — I10 Essential (primary) hypertension: Secondary | ICD-10-CM

## 2011-06-24 DIAGNOSIS — R972 Elevated prostate specific antigen [PSA]: Secondary | ICD-10-CM

## 2011-06-24 MED ORDER — ATENOLOL-CHLORTHALIDONE 50-25 MG PO TABS: 1.0000 | ORAL_TABLET | Freq: Every day | ORAL | Status: DC

## 2011-06-24 MED ORDER — HYDROCHLOROTHIAZIDE 25 MG PO TABS: 25.0000 mg | ORAL_TABLET | Freq: Every day | ORAL | Status: DC

## 2011-06-24 MED ORDER — SULFAMETHOXAZOLE-TRIMETHOPRIM 800-160 MG PO TABS: 1.0000 | ORAL_TABLET | Freq: Two times a day (BID) | ORAL | Status: AC

## 2011-06-24 MED ORDER — TADALAFIL 20 MG PO TABS: 20.0000 mg | ORAL_TABLET | Freq: Every day | ORAL | Status: DC | PRN

## 2011-06-24 NOTE — Progress Notes (Signed)
  Subjective:    Patient ID: John Blackburn, male    DOB: [birth year], 65 y.o.   MRN: [id number]  HPI John Blackburn is having a rise in BP. He had formerly been on atenolol/chlorthalidone 50/25 and his blood pressure was better. It was stopped as possible cause of ED. There was no change in erectile function off beta blocker.   He is asking about two recent PSA tests: Aug 25th 2.34 (health screening Zenaida Niece - [facility] to [facility]) and recent screening at [facility] with PSA 5.10. With the later study he had a digital rectal exam - presumably normal. He does report increased trouble with his stream: slow start, dribble, increased frequency. He is on Doxazosin 8 mg. He has been seen at [facility] for possible nodule on the prostate - had prostate biopsy that was negative.   I have reviewed the patient's medical history in detail and updated the computerized patient record.    Review of Systems System review is negative for any constitutional, cardiac, pulmonary, GI or neuro symptoms or complaints other than as described in the HPI.     Objective:   Physical Exam Vitals reviewed - BP elevated General - WNWN white male in no distress Pulm - normal respirations Cor - RRR        Assessment & Plan:

## 2011-06-24 NOTE — Patient Instructions (Addendum)
Rise in PSA from August to September 2.34 - 5.1 is very likely due to an acute or subacute inflammation of the prostate gland. Such an inflammation is also likely to cause increased symptoms: hard to start, dribble, increased frequency. Plan - septra DS bid x 14 days. Recheck of prostate in 6 weeks. If PSA remains high or if PSA is lower but symptoms are not better will need to refer back to urology.   Blood pressure, not well controlled.  BP Readings from Last 3 Encounters:  06/24/11 160/84  05/31/11 158/90  05/03/11 142/86   Plan - restart atenolol/chlorthalidone 50/25 for better control.

## 2011-06-26 NOTE — Assessment & Plan Note (Signed)
BP Readings from Last 3 Encounters:  06/24/11 160/84  05/31/11 158/90  05/03/11 142/86   Pattern of mild elevation. By his report he had better control when on B-blocker.  Plan - resume atenolol/chlorthalidone 50/25           Continue to monitor BP - call if SBP consistently greater than 140

## 2011-06-26 NOTE — Assessment & Plan Note (Signed)
Patient in a new relationship.  Plan- cialis 20 mg to take prn (sample of 3 provided)

## 2011-06-26 NOTE — Assessment & Plan Note (Signed)
Patient with BPH on doxazosin. Symptoms have been worse and PSA is elevated suggestive of possible prostatitis. He has had GU evaluation with prostate biopsy in the past 5 years.  Plan - treat for prostatitis - TMP/SMX DS bid x 14 days.           Repeat PSA in 4-6 weeks: if continued elevation will refer back to urology.

## 2011-08-12 ENCOUNTER — Other Ambulatory Visit: Payer: Self-pay | Admitting: *Deleted

## 2011-08-12 MED ORDER — TADALAFIL 5 MG PO TABS: 5.0000 mg | ORAL_TABLET | Freq: Every day | ORAL | Status: DC | PRN

## 2011-09-08 ENCOUNTER — Ambulatory Visit (INDEPENDENT_AMBULATORY_CARE_PROVIDER_SITE_OTHER): Payer: Medicare Other | Admitting: Internal Medicine

## 2011-09-08 ENCOUNTER — Encounter: Payer: Self-pay | Admitting: Internal Medicine

## 2011-09-08 VITALS — BP 122/82 | HR 59 | Temp 97.7°F | Resp 14 | Wt 247.5 lb

## 2011-09-08 DIAGNOSIS — N400 Enlarged prostate without lower urinary tract symptoms: Secondary | ICD-10-CM

## 2011-09-08 DIAGNOSIS — M545 Low back pain: Secondary | ICD-10-CM

## 2011-09-08 LAB — POCT URINALYSIS DIPSTICK
Ketones, UA: NEGATIVE
Protein, UA: NEGATIVE
Spec Grav, UA: 1.01
pH, UA: 8

## 2011-09-08 MED ORDER — ZOLPIDEM TARTRATE 5 MG PO TABS: 5.0000 mg | ORAL_TABLET | Freq: Every evening | ORAL | Status: DC | PRN

## 2011-09-08 NOTE — Progress Notes (Signed)
  Subjective:    Patient ID: John Blackburn, male    DOB: 09-29-1945, 66 y.o.   MRN: [id number]  HPI John Blackburn presents for a month long h/o intermittent low back pain x 3-4. This is associated with micturition: pain starts just before initiation of his stream with resolution of pain as he empties his bladder. No blood in the urine, not dark urine. No fever.   Last ov in October '12 - had an elevated PSA at 5.1. He was treated with TMP/SMX x 14 days. He did not return for follow-up PSA but to [facility] - Dr. Vernie Ammons- had a repeat PSA that was normal, repeat prostate exam that was normal. He does continue to take cardura.   He also has a problem with hoarseness - he saw ENT 3 years ago and was clear for cancer or structual abnormalities. He is on omeprazole 40 mb bid. He does have post nasal drainage.   I have reviewed the patient's medical history in detail and updated the computerized patient record.    Review of Systems System review is negative for any constitutional, cardiac, pulmonary, GI or neuro symptoms or complaints other than as described in the HPI.     Objective:   Physical Exam Filed Vitals:   09/08/11 1438  BP: 122/82  Pulse: 59  Temp: 97.7 F (36.5 C)  Resp: 14   Gen'l- overweight white man in no distress Pulm - normal respirations Cor- RRR Neuro - A&O x 3       Assessment & Plan:

## 2011-09-08 NOTE — Patient Instructions (Signed)
Flank pain when bladder is full may be a problem of back pressure. You have had a full urology evaluation. No blood or pain to suggest stone disease. Plan - try to not let bladder get to full. For continued problems call back and a CT abdomen/pelvis will be scheduled.  For hoarseness - continue prilosec. Add mucinex 1200 mg twice a day to thin secretions. For continued hoarseness a repeat ENT evaluation would be appropriate.   For sleep - zolpidem called in.

## 2011-09-11 NOTE — Assessment & Plan Note (Signed)
Patients symptoms do not suggest infection or nephrolithiasis. May be associated with BPH and poor bladder emptying - flank pain related to back pressure from over full bladder relieve with micturition.  Plan - for increasing pain, prolonged pain, dark urine will need referral back to Dr. Vernie Ammons.

## 2011-09-22 ENCOUNTER — Telehealth: Payer: Self-pay | Admitting: *Deleted

## 2011-09-22 NOTE — Telephone Encounter (Signed)
Ok to use my name when self-referring to [facility] or if he would like we can assist with the referral.

## 2011-09-22 NOTE — Telephone Encounter (Signed)
Pt states that he is continuing to have problems with his laryngitis and it's getting worse. He would like a 2nd opinion at [facility] & states that "he would like to use your name as a referral" Please advise.

## 2011-09-23 NOTE — Telephone Encounter (Signed)
Left msg & notified pt.

## 2011-11-13 ENCOUNTER — Ambulatory Visit (INDEPENDENT_AMBULATORY_CARE_PROVIDER_SITE_OTHER): Payer: Medicare Other | Admitting: Family Medicine

## 2011-11-13 VITALS — BP 130/70 | HR 71 | Temp 98.3°F | Resp 16 | Ht 73.5 in | Wt 249.8 lb

## 2011-11-13 DIAGNOSIS — J310 Chronic rhinitis: Secondary | ICD-10-CM

## 2011-11-13 DIAGNOSIS — J029 Acute pharyngitis, unspecified: Secondary | ICD-10-CM

## 2011-11-13 DIAGNOSIS — J387 Other diseases of larynx: Secondary | ICD-10-CM

## 2011-11-13 LAB — POCT RAPID STREP A (OFFICE): Rapid Strep A Screen: NEGATIVE

## 2011-11-13 MED ORDER — AMOXICILLIN 500 MG PO CAPS: 500.0000 mg | ORAL_CAPSULE | Freq: Three times a day (TID) | ORAL | Status: AC

## 2011-11-13 NOTE — Progress Notes (Signed)
[facility]:  Office Visit  Chief Complaint:  Chief Complaint  Patient presents with  . Sore Throat    x 3 days some post nasal drip    HPI: John Blackburn is a 66 y.o. male who complains of  3 day h/o of sorethroat, associated with green sinus drainage;  Denies fevers, chill, sinus tenderness, ear pain. Denies n/v abd pain. Denies allergies or asthma. He has had his tonsils taken out but states he has had strep infections even after tonsillectomy.  Past Medical History  Diagnosis Date  . 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 Depression     on multiple meds. Has been seen at [facility]. and Dr. Dub Mikes is his prescriber  . Diverticulitis of colon 2000 's    treated as an outpatient  . GERD (gastroesophageal reflux disease)     UGI done August '12 - ulcer/. Dr. Noe Gens, gastroenterologist in [facility].   . Peptic ulcer     in the past and just recently-August '12  . Hypertension   . Hyperlipidemia     last lipid panel: HDL 44, LDL 117  . Hx of adenomatous colonic polyps   . Sleep apnea, primary central    Past Surgical History  Procedure Date  . Cholecystectomy 1990    laproscopic   . Tkr bilateral 2006    Dr. Erskine Squibb   History   Social History  . Marital Status: Divorced    Spouse Name: N/A    Number of Children: 3  . Years of Education: 16   Occupational History  . contractor    Social History Main Topics  . Smoking status: Never Smoker   . Smokeless tobacco: Former Neurosurgeon  . Alcohol Use: 0.5 oz/week    1 drink(s) per week  . Drug Use: No  . Sexually Active: Not Currently -- Male partner(s)   Other Topics Concern  . 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 3 sons - '74, '76, '87.  1 grandchild.Work - Chief Executive Officer. Lives alone. No pets. Serially monogamous.ACP - he has a living will: DNR, DNI.   Family History    Problem Relation Age of Onset  . Stroke Mother   . Hypertension Mother   . Heart disease Mother   . Heart disease Father     CAD/MI-fatal sudden death  . Cancer Sister     colon cancer 20090-survivor  . Heart disease Paternal Uncle   . Heart disease Paternal Grandfather    Allergies  Allergen Reactions  . Cephalexin Rash  . Levaquin Rash   Prior to Admission medications   Medication Sig Start Date End Date Taking? Authorizing Provider  amphetamine-dextroamphetamine (ADDERALL, 20MG ,) 20 MG tablet Take 20 mg by mouth daily.     Yes Historical Provider, MD  atenolol-chlorthalidone (TENORETIC) 50-25 MG per tablet Take 1 tablet by mouth daily. 06/24/11 06/23/12 Yes Jacques Navy, MD  buPROPion (WELLBUTRIN XL) 150 MG 24 hr tablet Take 150 mg by mouth 2 (two) times daily.     Yes Historical Provider, MD  clotrimazole-betamethasone (LOTRISONE) cream Apply to affected area 2 times daily 05/31/11 05/30/12 Yes Jacques Navy, MD  desvenlafaxine (PRISTIQ) 100 MG 24 hr tablet Take 100 mg by mouth daily.     Yes Historical Provider, MD  fluticasone (FLONASE) 50 MCG/ACT nasal spray Place 2 sprays into the  nose daily. 05/10/11 05/09/12 Yes Jacques Navy, MD  Multiple Vitamins-Minerals (CENTRUM SILVER PO) Take 1 tablet by mouth.     Yes Historical Provider, MD  ALPRAZolam Prudy Feeler) 0.5 MG tablet Take 0.5 mg by mouth at bedtime as needed.      Historical Provider, MD  doxazosin (CARDURA) 8 MG tablet Take 8 mg by mouth at bedtime.      Historical Provider, MD  hydrochlorothiazide (HYDRODIURIL) 25 MG tablet Take 1 tablet (25 mg total) by mouth daily. 06/24/11 06/23/12  Jacques Navy, MD  omeprazole (PRILOSEC) 20 MG capsule Take 40 mg by mouth daily.     Historical Provider, MD  zolpidem (AMBIEN) 5 MG tablet Take 1 tablet (5 mg total) by mouth at bedtime as needed for sleep. 09/08/11 09/07/12  Jacques Navy, MD     ROS: The patient denies fevers, chills, night sweats, unintentional weight loss, chest  pain, palpitations, wheezing, dyspnea on exertion, nausea, vomiting, abdominal pain, dysuria, hematuria, melena, numbness, weakness, or tingling. +sore throat  All other systems have been reviewed and were otherwise negative with the exception of those mentioned in the HPI and as above.    PHYSICAL EXAM: Filed Vitals:   11/13/11 1652  BP: 130/70  Pulse: 71  Temp: 98.3 F (36.8 C)  Resp: 16   Filed Vitals:   11/13/11 1652  Height: 6' 1.5" (1.867 m)  Weight: 249 lb 12.8 oz (113.309 kg)   Body mass index is 32.51 kg/(m^2).  General: Alert, no acute distress HEENT:  Normocephalic, atraumatic, oropharynx patent. + erythematous throat, no exudates, TM nl, No sinus tenderness, + bilateral boggy, erythematous nares. + slight red irritated friable area on right posterior OP ? ulcer vs PND and mucus.  Cardiovascular:  Regular rate and rhythm, no rubs murmurs or gallops.  No Carotid bruits, radial pulse intact. No pedal edema.  Respiratory: Clear to auscultation bilaterally.  No wheezes, rales, or rhonchi.  No cyanosis, no use of accessory musculature GI: No organomegaly, abdomen is soft and non-tender, positive bowel sounds.  No masses. Skin: No rashes. Neurologic: Facial musculature symmetric. Psychiatric: Patient is appropriate throughout our interaction. Lymphatic: No cervical lymphadenopathy Musculoskeletal: Gait intact.   LABS: Results for orders placed in visit on 11/13/11  POCT RAPID STREP A (OFFICE)      Component Value Range   Rapid Strep A Screen Negative  Negative      EKG/XRAY:   Primary read interpreted by Dr. Conley Rolls at [facility].   ASSESSMENT/PLAN: Encounter Diagnoses  Name Primary?  . Pharyngitis Yes  . Throat ulcer   . Rhinitis    Told patient to try symptomatic treatment for pharyngitis and also rhinitis. Rx Amoxacillin for questionable ulcer in back of throat. If no improvement then needs reevaluation. He currently does not have any problems swallowing or eating and  does not recall if he ate anything that was hot or might have scratched the back of his throat. Currently it is just sore.     Hamilton Capri PHUONG, DO 11/14/2011 5:33 PM

## 2011-12-06 ENCOUNTER — Ambulatory Visit (INDEPENDENT_AMBULATORY_CARE_PROVIDER_SITE_OTHER): Payer: Medicare Other | Admitting: Family Medicine

## 2011-12-06 VITALS — BP 115/67 | HR 64 | Temp 100.2°F | Resp 18 | Ht 74.0 in | Wt 143.6 lb

## 2011-12-06 DIAGNOSIS — R350 Frequency of micturition: Secondary | ICD-10-CM

## 2011-12-06 DIAGNOSIS — N3943 Post-void dribbling: Secondary | ICD-10-CM

## 2011-12-06 DIAGNOSIS — R059 Cough, unspecified: Secondary | ICD-10-CM

## 2011-12-06 DIAGNOSIS — R05 Cough: Secondary | ICD-10-CM

## 2011-12-06 DIAGNOSIS — J209 Acute bronchitis, unspecified: Secondary | ICD-10-CM

## 2011-12-06 LAB — POCT URINALYSIS DIPSTICK
Bilirubin, UA: NEGATIVE
Blood, UA: NEGATIVE
Glucose, UA: NEGATIVE
Leukocytes, UA: NEGATIVE
Nitrite, UA: NEGATIVE
Spec Grav, UA: 1.02
Urobilinogen, UA: 0.2
pH, UA: 5.5

## 2011-12-06 LAB — POCT UA - MICROSCOPIC ONLY
Casts, Ur, LPF, POC: NEGATIVE
Crystals, Ur, HPF, POC: NEGATIVE
Mucus, UA: POSITIVE
Yeast, UA: NEGATIVE

## 2011-12-06 MED ORDER — AMOXICILLIN-POT CLAVULANATE 875-125 MG PO TABS: 1.0000 | ORAL_TABLET | Freq: Two times a day (BID) | ORAL | Status: AC

## 2011-12-06 NOTE — Progress Notes (Signed)
66 yo man with polyuria who about 45 days ago was driving and couldn't find a restroom and finally peed on himself.  Since then, he has had dribbling incontinence after finishing voiding.  Also complains of intermittent squeezing spasm just before voiding.  When he finishes voiding, the spasm goes away.  Had PSA and urology chk up several months ago:  normal  O:  Fullness in hypogastrium Rectal: enlarged prostate.  Results for orders placed in visit on 12/06/11  POCT UA - MICROSCOPIC ONLY      Component Value Range   WBC, Ur, HPF, POC 1-2     RBC, urine, microscopic 3-7     Bacteria, U Microscopic 1+     Mucus, UA pos     Epithelial cells, urine per micros 0-1     Crystals, Ur, HPF, POC neg     Casts, Ur, LPF, POC neg     Yeast, UA neg    POCT URINALYSIS DIPSTICK      Component Value Range   Color, UA yellow     Clarity, UA clear     Glucose, UA neg     Bilirubin, UA neg     Ketones, UA trace     Spec Grav, UA 1.020     Blood, UA neg     pH, UA 5.5     Protein, UA trace     Urobilinogen, UA 0.2     Nitrite, UA neg     Leukocytes, UA Negative       A:  Suspicious for urinary retention  P:  Urine culture, augmentin, ultrasound  Also c/o cough x 1 week, minimally productive No fever O:HEENT unremarkable Chest: few ronchi  A: bronchitis  P:  augmentin

## 2011-12-08 LAB — URINE CULTURE
Colony Count: NO GROWTH
Organism ID, Bacteria: NO GROWTH

## 2011-12-09 ENCOUNTER — Other Ambulatory Visit: Payer: Self-pay | Admitting: Family Medicine

## 2011-12-09 ENCOUNTER — Ambulatory Visit
Admission: RE | Admit: 2011-12-09 | Discharge: 2011-12-09 | Disposition: A | Discharge status: Home / Self Care | Payer: Medicare Other | Source: Ambulatory Visit | Attending: Family Medicine | Admitting: Family Medicine

## 2011-12-09 DIAGNOSIS — N3943 Post-void dribbling: Secondary | ICD-10-CM

## 2011-12-09 IMAGING — US US RENAL
1 series · 14 of 25 positions shown · non-contrast
Comparison: None.

CLINICAL DATA: 65-year-old male with hematuria and back pain.

RENAL/URINARY TRACT ULTRASOUND COMPLETE

[Series 1: us renal · 0.27mm/px · 14 of 30 slices shown]
[im 1/30]
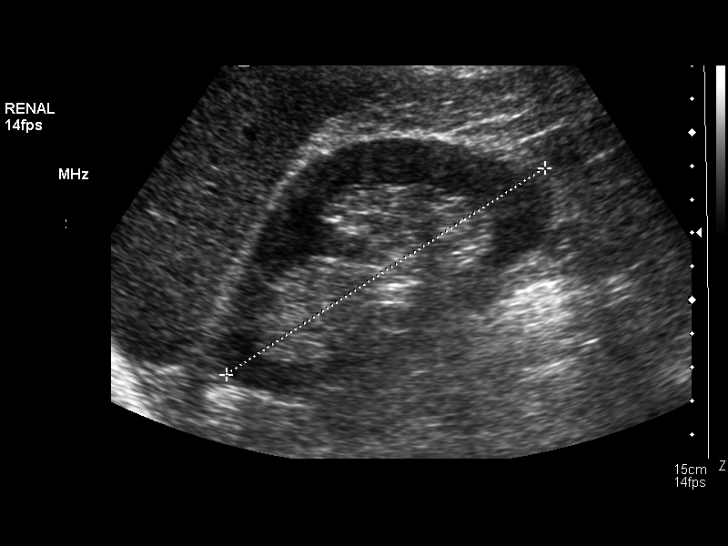
[im 3/30]
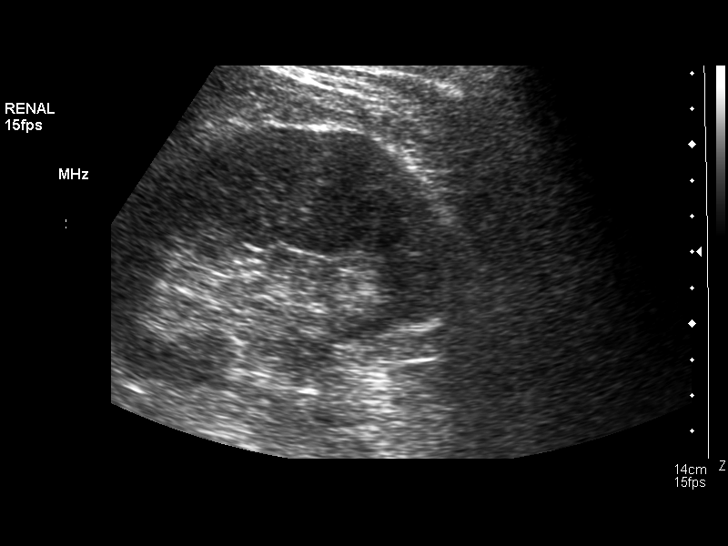
[im 5/30]
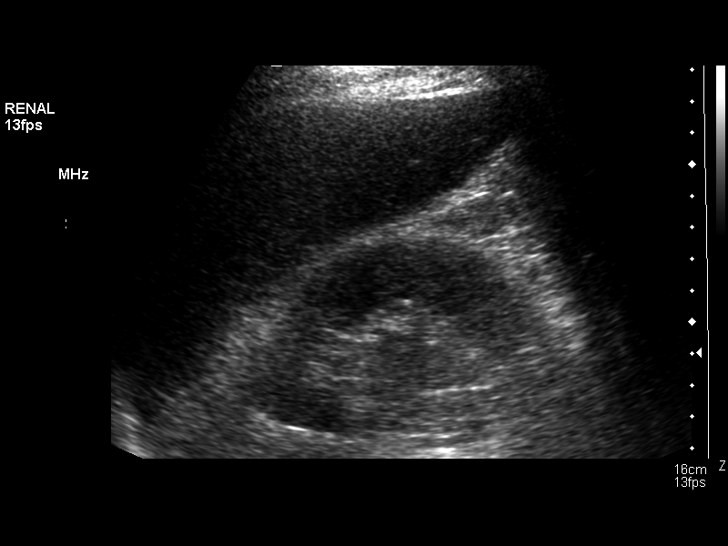
[im 8/30]
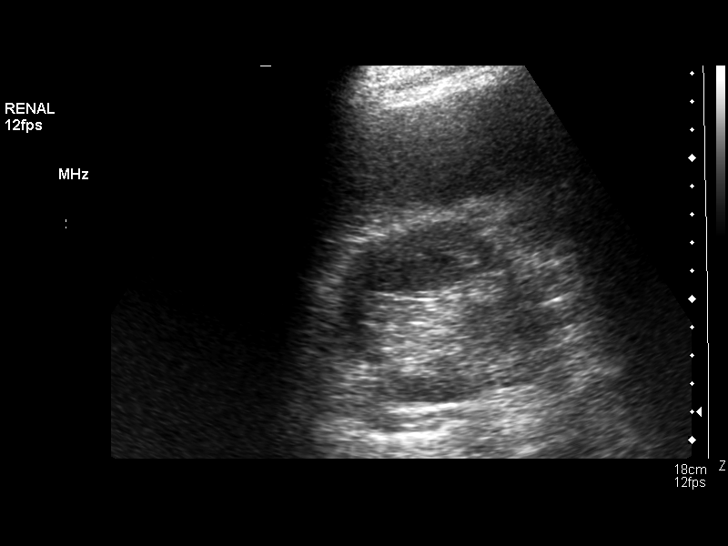
[im 10/30]
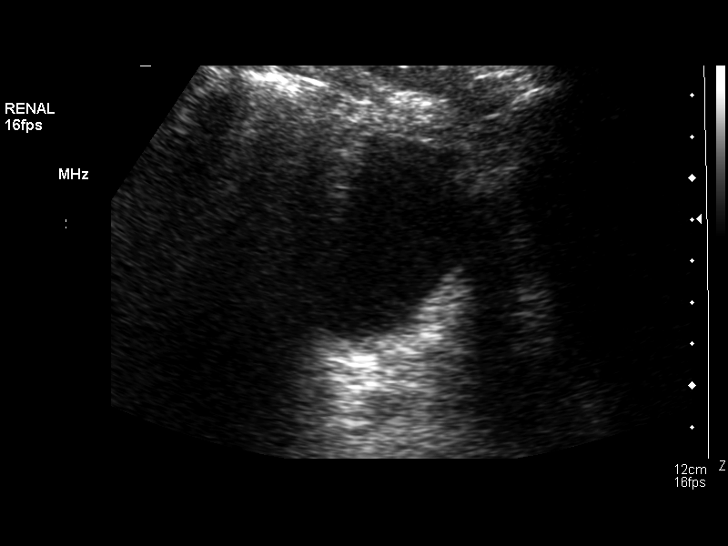
[im 11/30]
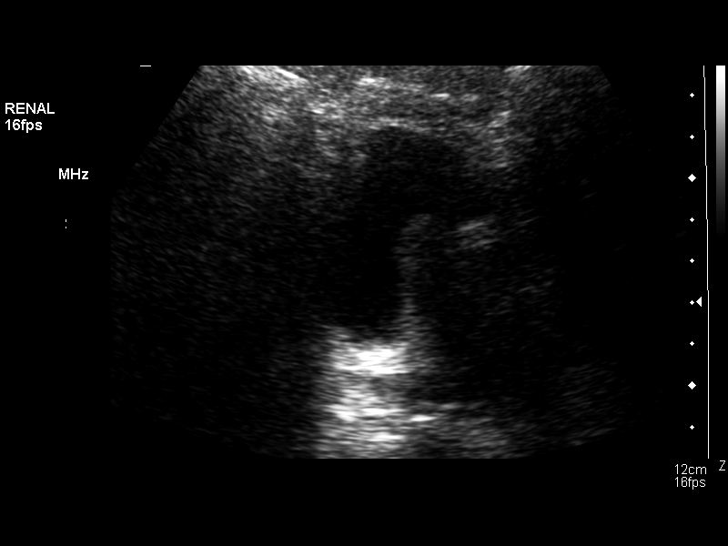
[im 14/30]
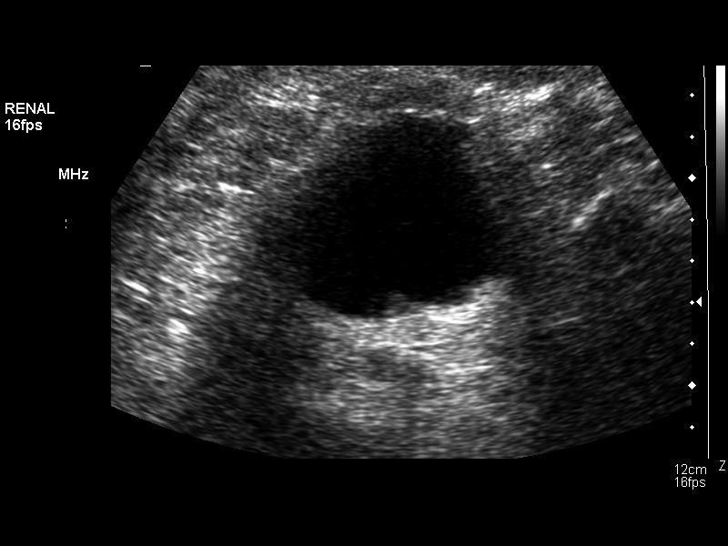
[im 16/30]
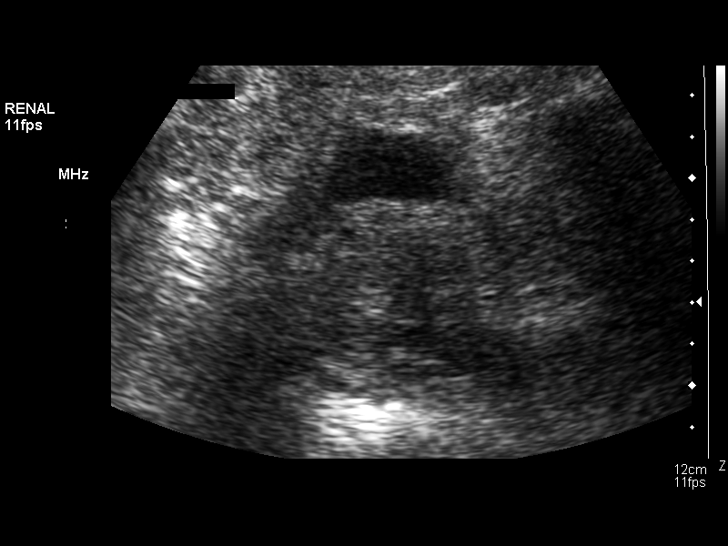
[im 19/30]
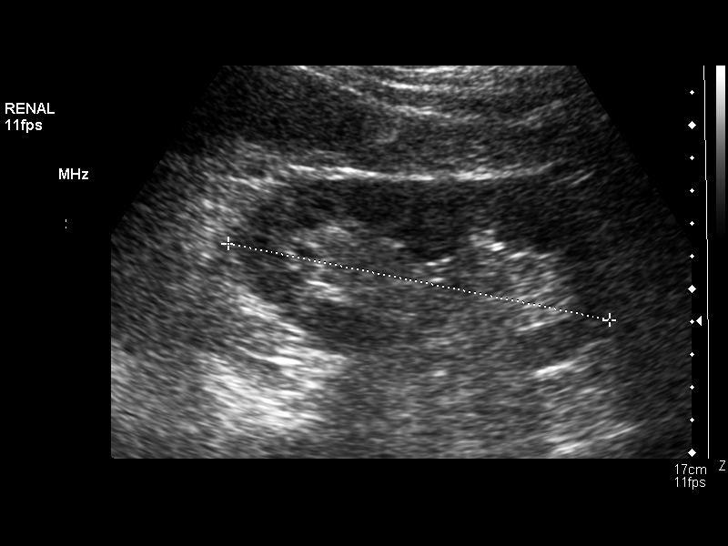
[im 20/30]
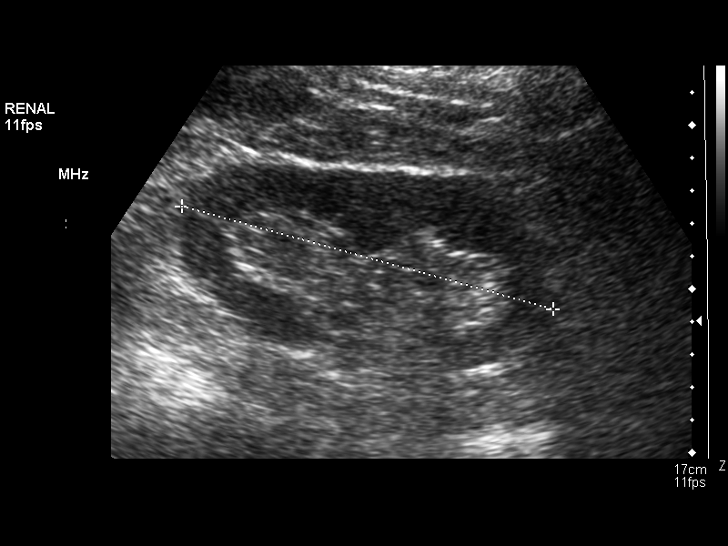
[im 22/30]
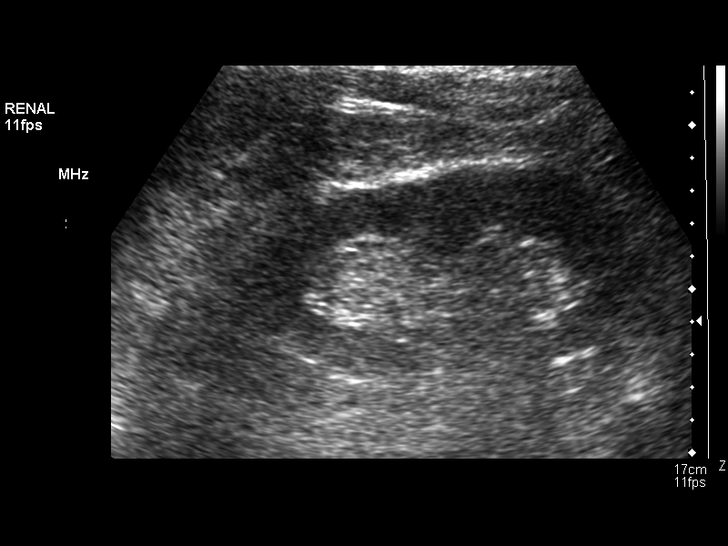
[im 25/30]
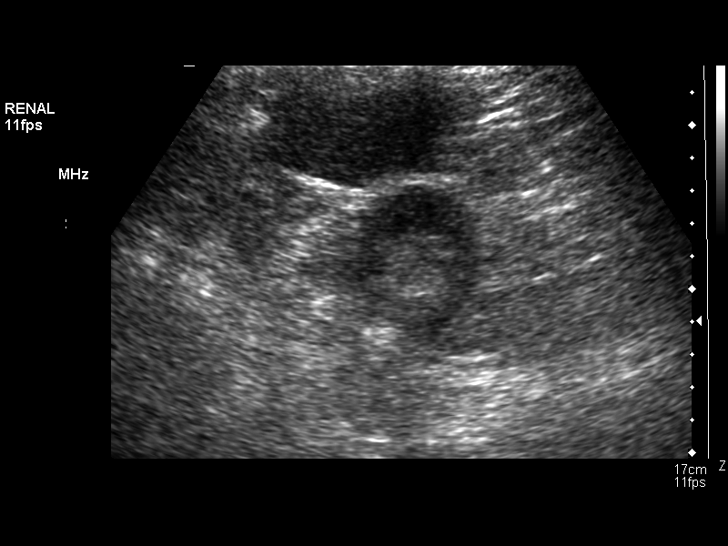
[im 27/30]
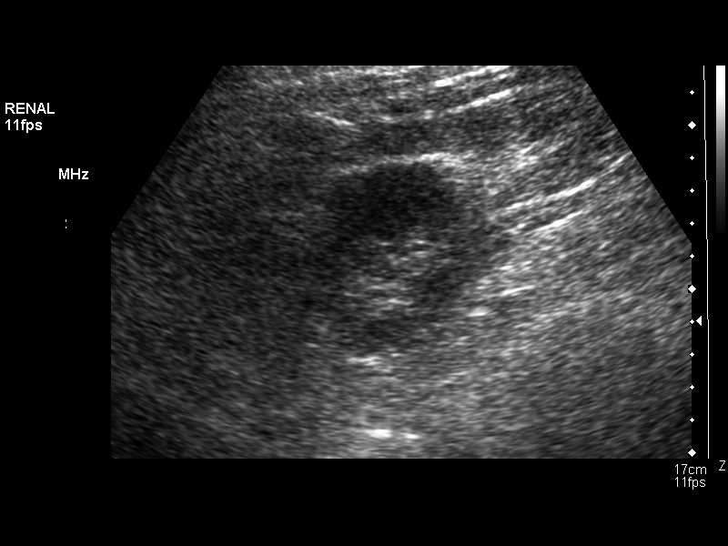
[im 30/30]
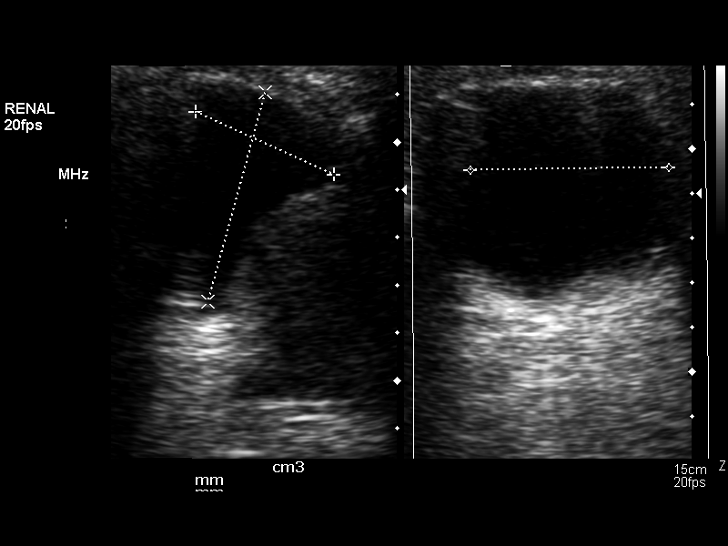

[14 of 25 positions shown; findings below may reference images not displayed]

FINDINGS: Right Kidney:  No hydronephrosis.  Renal length 11.3 cm.  Normal
cortical echotexture.  Normal cortical thickness.

Left Kidney:  No hydronephrosis.  Renal length 11.2 cm.  Normal
cortical echotexture and thickness.

Bladder:  Calculated pre void volume of 55 ml.  Postvoid residual
of 34 ml.  Mild prostate hypertrophy measuring 4.2 x 4.6 x 4.1 cm.
IMPRESSION: Normal sonographic appearance of the kidneys. Mild prostate
hypertrophy and bladder post void residual of 34 ml.

## 2011-12-09 NOTE — Progress Notes (Signed)
Addended by: Morrell Riddle on: 12/09/2011 09:50 AM   Modules accepted: Orders

## 2011-12-12 ENCOUNTER — Ambulatory Visit (INDEPENDENT_AMBULATORY_CARE_PROVIDER_SITE_OTHER): Payer: Medicare Other | Admitting: Family Medicine

## 2011-12-12 VITALS — BP 125/74 | HR 59 | Temp 97.4°F | Resp 20 | Ht 73.0 in | Wt 240.2 lb

## 2011-12-12 DIAGNOSIS — N400 Enlarged prostate without lower urinary tract symptoms: Secondary | ICD-10-CM

## 2011-12-12 DIAGNOSIS — R05 Cough: Secondary | ICD-10-CM

## 2011-12-12 MED ORDER — HYDROCODONE-HOMATROPINE 5-1.5 MG/5ML PO SYRP: 5.0000 mL | ORAL_SOLUTION | Freq: Three times a day (TID) | ORAL | Status: AC | PRN

## 2011-12-12 NOTE — Progress Notes (Signed)
  Subjective:    Patient ID: John Blackburn, male    DOB: 1945-12-25, 66 y.o.   MRN: [id number]  HPI 66 yo male here for follow-up form 4/2.   1) Cough - not much better.  Maybe starting to "break up" some today.  Taking augmentin.  No cough syrup.  Sore throat from coughing.  No fever or ear pain or head congestion.   2) Had ultrasound due to urinary symptoms of dribbling, incomplete voiding and frequency.  Had not heard results yet.  Discussed it showed mild prostatic enlargement and a PVR of 34mL.  He already knew he had BPH.  Does follow with a urologist (can't remember name - comes over from [facility]).  Has never been on prostate medication though.  Symptoms still the same.  PNlans to call and make appt with his urologist.    Review of Systems Negative except as per HPI     Objective:   Physical Exam  Constitutional: He appears well-developed. No distress.  HENT:  Right Ear: Tympanic membrane, external ear and ear canal normal. Tympanic membrane is not injected, not scarred, not perforated, not erythematous, not retracted and not bulging.  Left Ear: Tympanic membrane, external ear and ear canal normal. Tympanic membrane is not injected, not scarred, not perforated, not erythematous, not retracted and not bulging.  Nose: No mucosal edema or rhinorrhea. Right sinus exhibits no maxillary sinus tenderness and no frontal sinus tenderness. Left sinus exhibits no maxillary sinus tenderness and no frontal sinus tenderness.  Mouth/Throat: Uvula is midline, oropharynx is clear and moist and mucous membranes are normal. No oropharyngeal exudate or tonsillar abscesses.  Cardiovascular: Normal rate, regular rhythm, normal heart sounds and intact distal pulses.   No murmur heard. Pulmonary/Chest: Effort normal and breath sounds normal. No respiratory distress. He has no wheezes. He has no rales.  Lymphadenopathy:       Head (right side): No submandibular and no preauricular adenopathy present.     Head (left side): No submandibular and no preauricular adenopathy present.       Right cervical: No superficial cervical and no posterior cervical adenopathy present.      Left cervical: No superficial cervical and no posterior cervical adenopathy present.       Right: No supraclavicular adenopathy present.       Left: No supraclavicular adenopathy present.  Skin: Skin is warm and dry.          Assessment & Plan:  Cough - add hycodan.  Complete augmentin.  2nd round not indicated.   BPH/urinary symptoms - patient will return to his own urologist.

## 2011-12-20 ENCOUNTER — Ambulatory Visit
Admission: RE | Admit: 2011-12-20 | Discharge: 2011-12-20 | Disposition: A | Discharge status: Home / Self Care | Payer: Medicare Other | Source: Ambulatory Visit | Attending: Physician Assistant | Admitting: Physician Assistant

## 2011-12-20 ENCOUNTER — Other Ambulatory Visit: Payer: Self-pay | Admitting: Physician Assistant

## 2011-12-20 DIAGNOSIS — N3943 Post-void dribbling: Secondary | ICD-10-CM

## 2012-02-24 ENCOUNTER — Telehealth: Payer: Self-pay | Admitting: Internal Medicine

## 2012-02-24 DIAGNOSIS — H9190 Unspecified hearing loss, unspecified ear: Secondary | ICD-10-CM

## 2012-02-24 NOTE — Telephone Encounter (Signed)
The pt called and is need of a referral to do a hearing test at [facility] and [facility].  Please advise if you want him to have an OV prior to this.  Thanks!   Callback - [phone]

## 2012-02-26 NOTE — Telephone Encounter (Signed)
Order placed for [facility]

## 2012-05-01 ENCOUNTER — Ambulatory Visit: Payer: Medicare Other | Admitting: Internal Medicine

## 2012-05-15 ENCOUNTER — Ambulatory Visit (INDEPENDENT_AMBULATORY_CARE_PROVIDER_SITE_OTHER): Payer: Medicare Other | Admitting: Internal Medicine

## 2012-05-15 ENCOUNTER — Encounter: Payer: Self-pay | Admitting: Internal Medicine

## 2012-05-15 ENCOUNTER — Other Ambulatory Visit (INDEPENDENT_AMBULATORY_CARE_PROVIDER_SITE_OTHER): Payer: Medicare Other

## 2012-05-15 VITALS — BP 122/80 | HR 60 | Temp 97.3°F | Resp 16 | Wt 229.0 lb

## 2012-05-15 DIAGNOSIS — R5383 Other fatigue: Secondary | ICD-10-CM

## 2012-05-15 DIAGNOSIS — E785 Hyperlipidemia, unspecified: Secondary | ICD-10-CM

## 2012-05-15 DIAGNOSIS — I10 Essential (primary) hypertension: Secondary | ICD-10-CM

## 2012-05-15 DIAGNOSIS — N400 Enlarged prostate without lower urinary tract symptoms: Secondary | ICD-10-CM

## 2012-05-15 DIAGNOSIS — Z23 Encounter for immunization: Secondary | ICD-10-CM

## 2012-05-15 DIAGNOSIS — R5381 Other malaise: Secondary | ICD-10-CM

## 2012-05-15 LAB — COMPREHENSIVE METABOLIC PANEL
Albumin: 3.9 g/dL (ref 3.5–5.2)
Alkaline Phosphatase: 54 U/L (ref 39–117)
BUN: 21 mg/dL (ref 6–23)
CO2: 31 mEq/L (ref 19–32)
Calcium: 9.4 mg/dL (ref 8.4–10.5)
Chloride: 98 mEq/L (ref 96–112)
GFR: 99.86 mL/min (ref >60.00)
Glucose, Bld: 112 mg/dL — ABNORMAL HIGH (ref 70–99)
Potassium: 4.4 mEq/L (ref 3.5–5.1)
Sodium: 139 mEq/L (ref 135–145)
Total Protein: 7.3 g/dL (ref 6.0–8.3)

## 2012-05-15 LAB — T4, FREE: Free T4: 0.97 ng/dL (ref 0.60–1.60)

## 2012-05-15 LAB — HEPATIC FUNCTION PANEL
ALT: 23 U/L (ref 0–53)
Total Bilirubin: 0.7 mg/dL (ref 0.3–1.2)

## 2012-05-15 LAB — CBC WITH DIFFERENTIAL/PLATELET
Basophils Absolute: 0 10*3/uL (ref 0.0–0.1)
HCT: 46.7 % (ref 39.0–52.0)
Hemoglobin: 15.4 g/dL (ref 13.0–17.0)
Lymphs Abs: 1.4 10*3/uL (ref 0.7–4.0)
MCHC: 33.1 g/dL (ref 30.0–36.0)
Monocytes Relative: 6.9 % (ref 3.0–12.0)
Neutro Abs: 6.9 10*3/uL (ref 1.4–7.7)
RDW: 14.9 % — ABNORMAL HIGH (ref 11.5–14.6)

## 2012-05-15 MED ORDER — FLUTICASONE PROPIONATE 50 MCG/ACT NA SUSP: 2.0000 | Freq: Every day | NASAL | Status: DC

## 2012-05-15 NOTE — Assessment & Plan Note (Signed)
BP Readings from Last 3 Encounters:  05/15/12 122/80  12/12/11 125/74  12/06/11 115/67   Good control

## 2012-05-15 NOTE — Progress Notes (Signed)
Subjective:    Patient ID: John Blackburn, male    DOB: 1946-07-10, 66 y.o.   MRN: [id number]  HPI John Blackburn presents for h/o severe fatigue and malaise for the past several months. He feels it is getting worse. He denies c/p, DOE/SOB, no decreased exercise tolerance. He does have a positive family h/o sudden cardiac death father, grandfather and uncle. Chart reviewed: he had a negative nuclear stress test in '00  Past Medical History  Diagnosis Date  . 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 Depression     on multiple meds. Has been seen at [facility]. and Dr. Dub Mikes is his prescriber  . Diverticulitis of colon 2000 's    treated as an outpatient  . GERD (gastroesophageal reflux disease)     UGI done August '12 - ulcer/. Dr. Noe Gens, gastroenterologist in [facility].   . Peptic ulcer     in the past and just recently-August '12  . Hypertension   . Hyperlipidemia     last lipid panel: HDL 44, LDL 117  . Hx of adenomatous colonic polyps   . Sleep apnea, primary central    Past Surgical History  Procedure Date  . Cholecystectomy 1990    laproscopic   . Tkr bilateral 2006    Dr. Erskine Squibb   Family History  Problem Relation Age of Onset  . Stroke Mother   . Hypertension Mother   . Heart disease Mother   . Heart disease Father     CAD/MI-fatal sudden death  . Cancer Sister     colon cancer 20090-survivor  . Heart disease Paternal Uncle   . Heart disease Paternal Grandfather    History   Social History  . Marital Status: Divorced    Spouse Name: N/A    Number of Children: 3  . Years of Education: 16   Occupational History  . contractor    Social History Main Topics  . Smoking status: Never Smoker   . Smokeless tobacco: Never Used  . Alcohol Use: 0.5 oz/week    1 drink(s) per week     social  . Drug Use: No  . Sexually Active: Not Currently -- Male partner(s)   Other Topics Concern  . 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 3 sons - '74, '76, '87.  1 grandchild.Work - Chief Executive Officer. Lives alone. No pets. Serially monogamous.ACP - he has a living will: DNR, DNI.    Current Outpatient Prescriptions on File Prior to Visit  Medication Sig Dispense Refill  . amphetamine-dextroamphetamine (ADDERALL, 20MG ,) 20 MG tablet Take 20 mg by mouth daily.        [name] atenolol-chlorthalidone (TENORETIC) 50-25 MG per tablet Take 1 tablet by mouth daily.  30 tablet  11  . Multiple Vitamins-Minerals (CENTRUM SILVER PO) Take 1 tablet by mouth.        . ALPRAZolam (XANAX) 0.5 MG tablet Take 0.5 mg by mouth at bedtime as needed.        [name] buPROPion (WELLBUTRIN XL) 150 MG 24 hr tablet Take 150 mg by mouth 2 (two) times daily.        . clotrimazole-betamethasone (LOTRISONE) cream Apply to affected area 2 times daily  15 g  1  . desvenlafaxine (PRISTIQ) 100 MG 24 hr tablet Take 100 mg by mouth daily.        [name] doxazosin (CARDURA)  8 MG tablet Take 8 mg by mouth at bedtime.        . fluticasone (FLONASE) 50 MCG/ACT nasal spray Place 2 sprays into the nose daily.  16 g  12  . hydrochlorothiazide (HYDRODIURIL) 25 MG tablet Take 1 tablet (25 mg total) by mouth daily.  30 tablet  90  . omeprazole (PRILOSEC) 20 MG capsule Take 40 mg by mouth daily.       [name] zolpidem (AMBIEN) 5 MG tablet Take 1 tablet (5 mg total) by mouth at bedtime as needed for sleep.  20 tablet  2      Review of Systems System review is negative for any constitutional, cardiac, pulmonary, GI or neuro symptoms or complaints other than as described in the HPI.     Objective:   Physical Exam Filed Vitals:   05/15/12 1100  BP: 122/80  Pulse: 60  Temp: 97.3 F (36.3 C)  Resp: 16   Wt Readings from Last 3 Encounters:  05/15/12 229 lb (103.874 kg)  12/12/11 240 lb 3.2 oz (108.954 kg)  12/06/11 143 lb 9.6 oz (65.137 kg)   Gen'l- WNWD white man HEENT_ C&S clear Cor- 2+ radial  pulse, RRR Pulm - normal respirations, CTAP Neuro - awake and alert.       Assessment & Plan:  Fatigue and malaise  -  Will check lab: thyroid, blood sugar, r/o anemia, check electrolytes.  Cardiac risk - very positive family history but no cardiac symptoms. Normal Nuc stress in '00  Plan - recheck cholesterol - if elevated will consider GXT. However he has outlived those family members with early sudden death.

## 2012-05-15 NOTE — Assessment & Plan Note (Signed)
Repeat lipid panel in light of fatigue and concern for cardiac disease.

## 2012-05-15 NOTE — Patient Instructions (Addendum)
1. Fatigue and malaise  -  Will check lab: thyroid, blood sugar, r/o anemia, check electrolytes.  2. Cardiac risk - very positive family history but no cardiac symptoms. Normal Nuc stress in '00  Plan - recheck cholesterol - if elevated will consider GXT. However he has outlived those family members with early sudden death.

## 2012-05-15 NOTE — Assessment & Plan Note (Signed)
Under the care of Dr. Vernie Ammons and doing better.

## 2012-05-20 ENCOUNTER — Encounter: Payer: Self-pay | Admitting: Internal Medicine

## 2012-07-02 ENCOUNTER — Other Ambulatory Visit: Payer: Self-pay | Admitting: Internal Medicine

## 2012-07-09 DIAGNOSIS — R972 Elevated prostate specific antigen [PSA]: Secondary | ICD-10-CM | POA: Insufficient documentation

## 2012-07-29 ENCOUNTER — Encounter: Payer: Self-pay | Admitting: Family Medicine

## 2012-07-29 DIAGNOSIS — R972 Elevated prostate specific antigen [PSA]: Secondary | ICD-10-CM

## 2012-09-05 ENCOUNTER — Ambulatory Visit (INDEPENDENT_AMBULATORY_CARE_PROVIDER_SITE_OTHER): Payer: Medicare Other | Admitting: Family Medicine

## 2012-09-05 VITALS — BP 144/73 | HR 80 | Temp 98.0°F | Resp 18 | Ht 73.0 in | Wt 239.0 lb

## 2012-09-05 DIAGNOSIS — J329 Chronic sinusitis, unspecified: Secondary | ICD-10-CM

## 2012-09-05 MED ORDER — DOXYCYCLINE HYCLATE 100 MG PO TABS: 100.0000 mg | ORAL_TABLET | Freq: Two times a day (BID) | ORAL | Status: DC

## 2012-09-05 NOTE — Patient Instructions (Addendum)
It appears that you have a sinus infection. Use your antibiotic, drink plenty of fluids and rest.  You can try plain mucinex and claritin OTC for your symptoms.  Let me know if you are not getting better in the next few days- Sooner if worse.

## 2012-09-05 NOTE — Progress Notes (Signed)
[facility] [address] [place] [address] [phone]  Date:  09/05/2012   Name:  John Blackburn   DOB:  06/08/1946   MRN:  [id number]  PCP:  Illene Regulus, MD    Chief Complaint: Sinusitis   History of Present Illness:  John Blackburn is a 67 y.o. very pleasant male patient who presents with the following:  He is here today with concern regarding a sinus infection. He notes severe nasal congestion.  He is blowing out a lot of mucus with some blood.   Mild cough. No fever, aches or chills, but he does have malaise and sinus pressure and pain.    Patient Active Problem List  Diagnosis  . Arthritis  . Depression  . GERD (gastroesophageal reflux disease)  . Hypertension  . Hyperlipidemia  . Sleep apnea, primary central  . BPH (benign prostatic hypertrophy)  . Rash  . ED (erectile dysfunction)  . Laryngitis, chronic  . Need for prophylactic vaccination with tetanus-diphtheria (TD)  . Need for prophylactic vaccination against Streptococcus pneumoniae (pneumococcus)  . Elevated prostate specific antigen (PSA)    Past Medical History  Diagnosis Date  . 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 Depression     on multiple meds. Has been seen at [facility]. and Dr. Dub Mikes is his prescriber  . Diverticulitis of colon 2000 's    treated as an outpatient  . GERD (gastroesophageal reflux disease)     UGI done August '12 - ulcer/. Dr. Noe Gens, gastroenterologist in [facility].   . Peptic ulcer     in the past and just recently-August '12  . Hypertension   . Hyperlipidemia     last lipid panel: HDL 44, LDL 117  . Hx of adenomatous colonic polyps   . Sleep apnea, primary central   . skin cancer   . Allergy     Past Surgical History  Procedure Date  . Cholecystectomy 1990    laproscopic   . Tkr bilateral 2006    Dr. Erskine Squibb  . Joint replacement     knee  . Prostate surgery     needle biopsy's  . Hernia repair     . Vasectomy     History  Substance Use Topics  . Smoking status: Never Smoker   . Smokeless tobacco: Never Used  . Alcohol Use: 0.5 oz/week    1 drink(s) per week     Comment: social    Family History  Problem Relation Age of Onset  . Stroke Mother   . Hypertension Mother   . Heart disease Mother   . Heart disease Father     CAD/MI-fatal sudden death  . Cancer Sister     colon cancer 20090-survivor  . Heart disease Paternal Uncle   . Heart disease Paternal Grandfather     Allergies  Allergen Reactions  . Cephalexin Rash  . Levofloxacin Rash    Medication list has been reviewed and updated.  Current Outpatient Prescriptions on File Prior to Visit  Medication Sig Dispense Refill  . atenolol-chlorthalidone (TENORETIC) 50-25 MG per tablet TAKE ONE TABLET BY MOUTH EVERY DAY  30 tablet  5  . ALPRAZolam (XANAX) 0.5 MG tablet Take 0.5 mg by mouth at bedtime as needed.        [name] amphetamine-dextroamphetamine (ADDERALL, 20MG ,) 20 MG tablet Take 20 mg by mouth daily.        [name] buPROPion (WELLBUTRIN XL)  150 MG 24 hr tablet Take 150 mg by mouth 2 (two) times daily.        [name] desvenlafaxine (PRISTIQ) 100 MG 24 hr tablet Take 100 mg by mouth daily.        [name] doxazosin (CARDURA) 8 MG tablet Take 8 mg by mouth at bedtime.        . fluticasone (FLONASE) 50 MCG/ACT nasal spray Place 2 sprays into the nose daily.  16 g  12  . hydrochlorothiazide (HYDRODIURIL) 25 MG tablet Take 1 tablet (25 mg total) by mouth daily.  30 tablet  90  . Multiple Vitamins-Minerals (CENTRUM SILVER PO) Take 1 tablet by mouth.        [name] omeprazole (PRILOSEC) 20 MG capsule Take 40 mg by mouth daily.       . silodosin (RAPAFLO) 8 MG CAPS capsule Take 8 mg by mouth daily with breakfast.      . zolpidem (AMBIEN) 5 MG tablet Take 1 tablet (5 mg total) by mouth at bedtime as needed for sleep.  20 tablet  2    Review of Systems:  As per HPI- otherwise negative.   Physical Examination: Filed Vitals:   09/05/12 1401   BP: 144/73  Pulse: 80  Temp: 98 F (36.7 C)  Resp: 18   Filed Vitals:   09/05/12 1401  Height: 6\' 1"  (1.854 m)  Weight: 239 lb (108.41 kg)   Body mass index is 31.53 kg/(m^2). Ideal Body Weight: Weight in (lb) to have BMI = 25: 189.1   GEN: WDWN, NAD, Non-toxic, A & O x 3 HEENT: Atraumatic, Normocephalic. Neck supple. No masses, No LAD. Bilateral TM wnl, oropharynx normal.  PEERL,EOMI.   Nasal congestion and sinus tenderness  Ears and Nose: No external deformity. CV: RRR, No M/G/R. No JVD. No thrill. No extra heart sounds. PULM: CTA B, no wheezes, crackles, rhonchi. No retractions. No resp. distress. No accessory muscle use EXTR: No c/c/e NEURO Normal gait.  PSYCH: Normally interactive. Conversant. Not depressed or anxious appearing.  Calm demeanor.    Assessment and Plan: 1. Sinusitis  doxycycline (VIBRA-TABS) 100 MG tablet   Treat as above- let me know if not better, Sooner if worse.   See patient instructions for more details.     Abbe Amsterdam, MD

## 2012-09-13 ENCOUNTER — Telehealth: Payer: Self-pay | Admitting: Gastroenterology

## 2012-09-13 NOTE — Telephone Encounter (Signed)
Forward 55 pages from [facility] to Dr. Sheryn Bison for review on 09-13-12 ym

## 2013-03-18 ENCOUNTER — Ambulatory Visit
Admission: RE | Admit: 2013-03-18 | Discharge: 2013-03-18 | Disposition: A | Discharge status: Home / Self Care | Payer: Medicare Other | Source: Ambulatory Visit | Attending: Urology | Admitting: Urology

## 2013-03-18 ENCOUNTER — Other Ambulatory Visit: Payer: Self-pay | Admitting: Urology

## 2013-03-18 DIAGNOSIS — R109 Unspecified abdominal pain: Secondary | ICD-10-CM

## 2013-03-18 IMAGING — US US RENAL
1 series · 14 of 25 positions shown · non-contrast
Comparison: CT scan [DATE].

CLINICAL DATA: Flank pain.

RENAL/URINARY TRACT ULTRASOUND COMPLETE

[Series 1: us renal · 0.35mm/px · 14 of 45 slices shown]
[im 1/45]
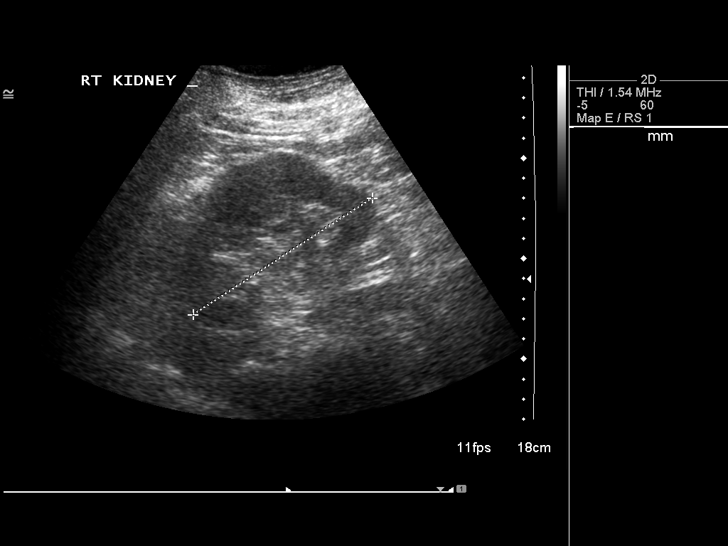
[im 4/45]
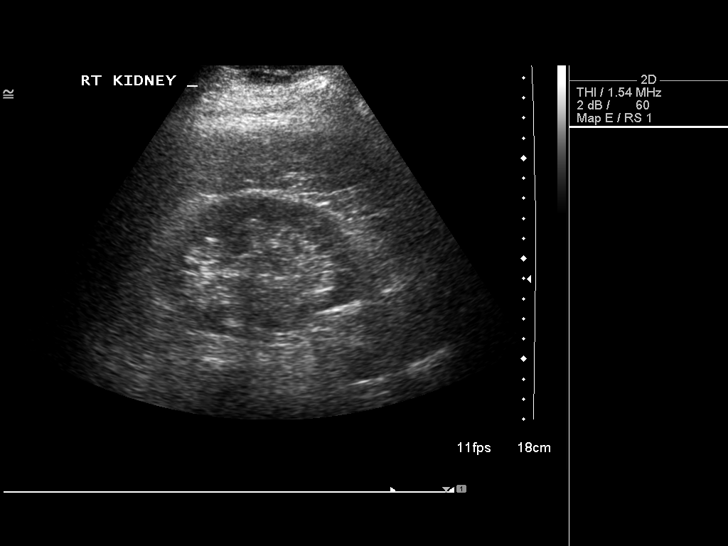
[im 8/45]
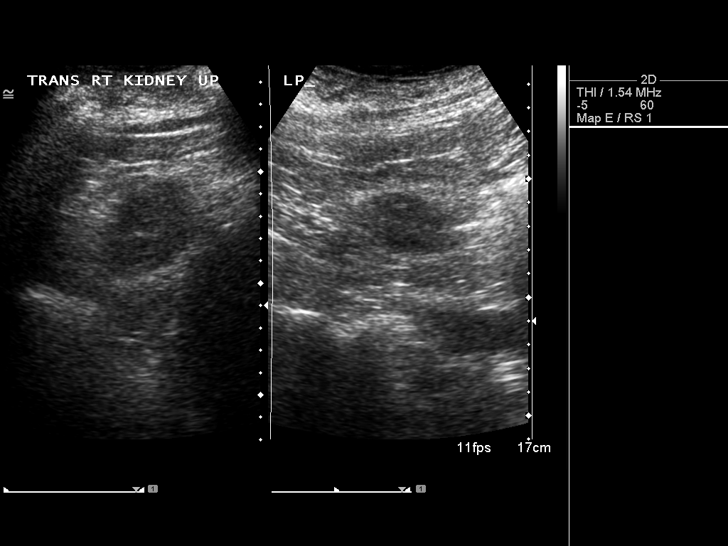
[im 12/45]
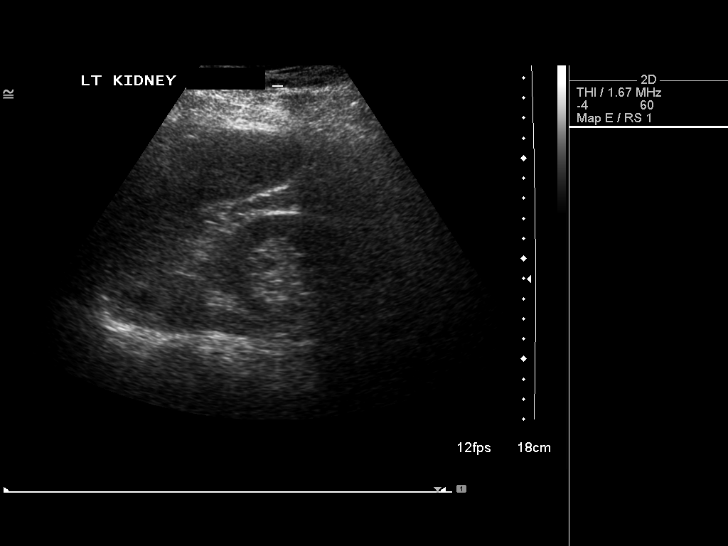
[im 15/45]
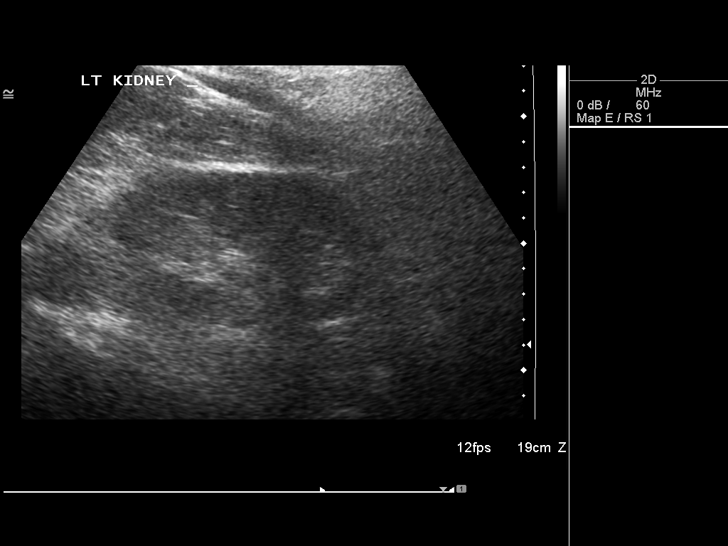
[im 17/45]
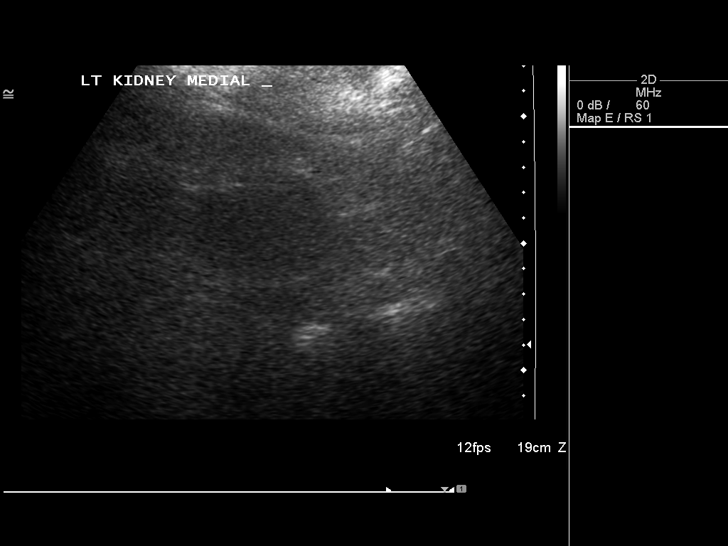
[im 21/45]
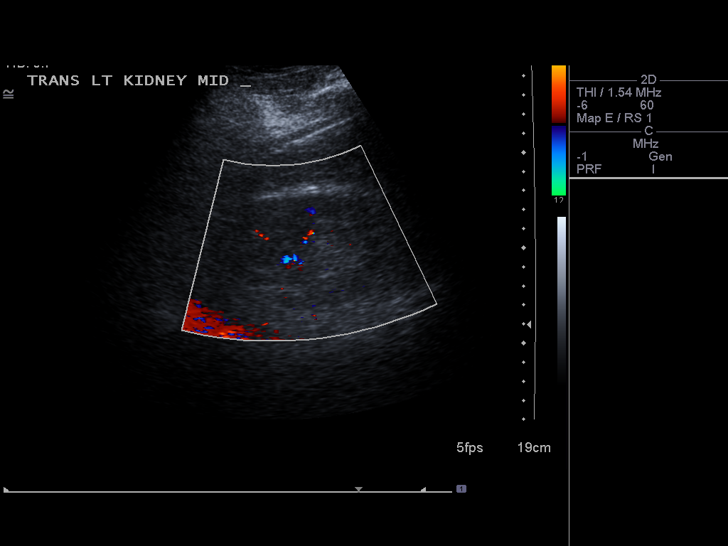
[im 24/45]
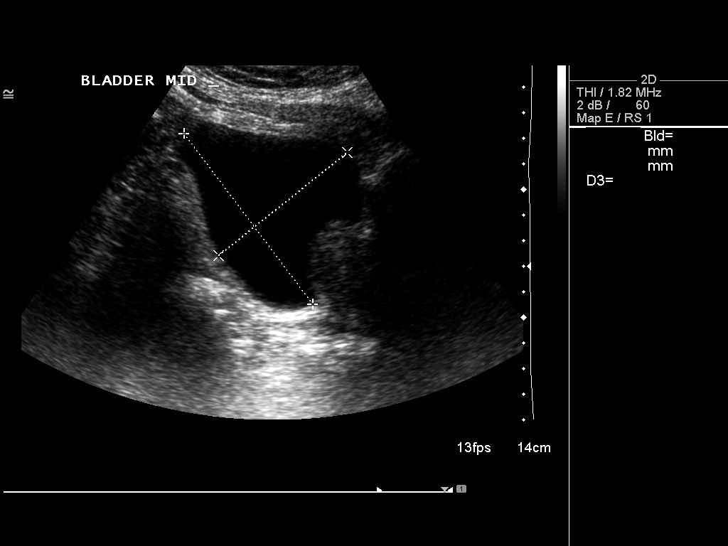
[im 28/45]
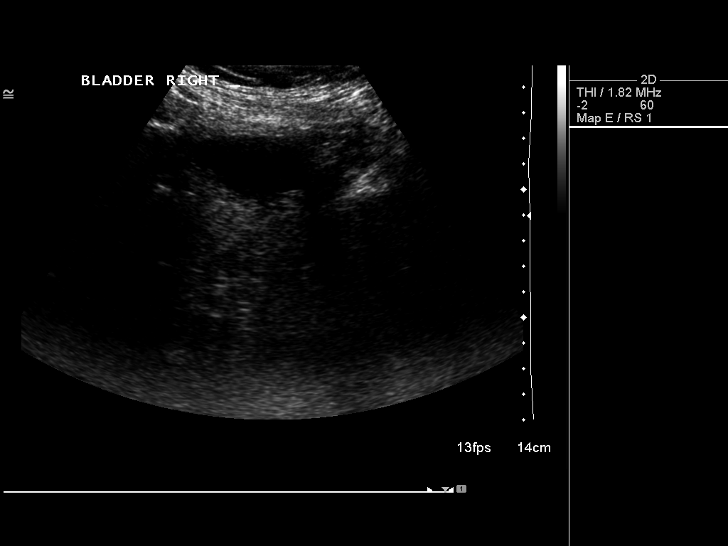
[im 30/45]
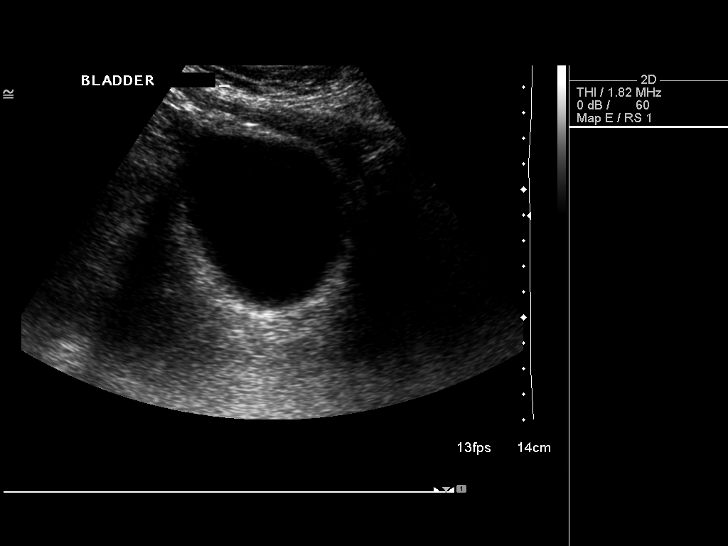
[im 34/45]
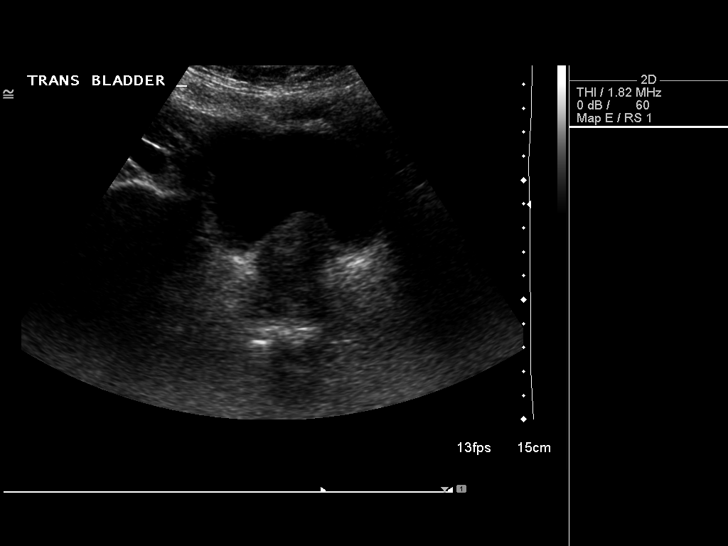
[im 37/45]
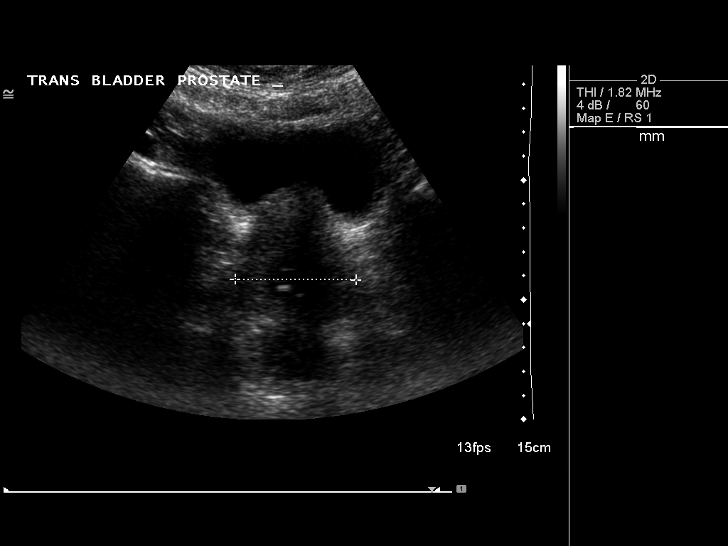
[im 41/45]
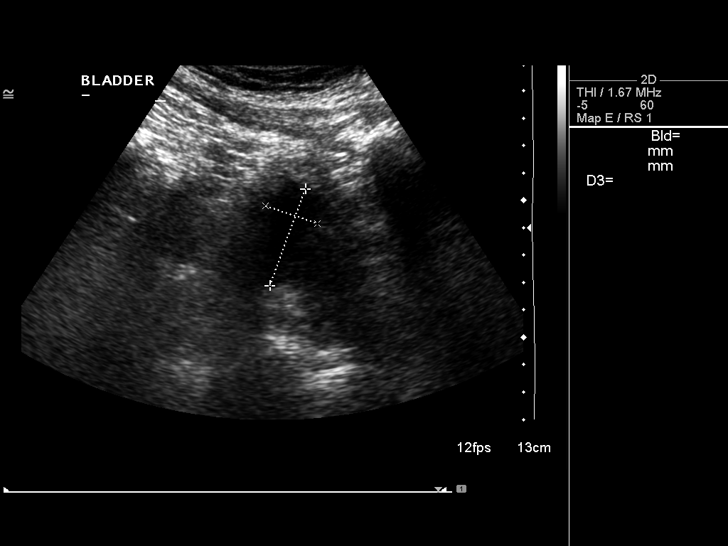
[im 45/45]
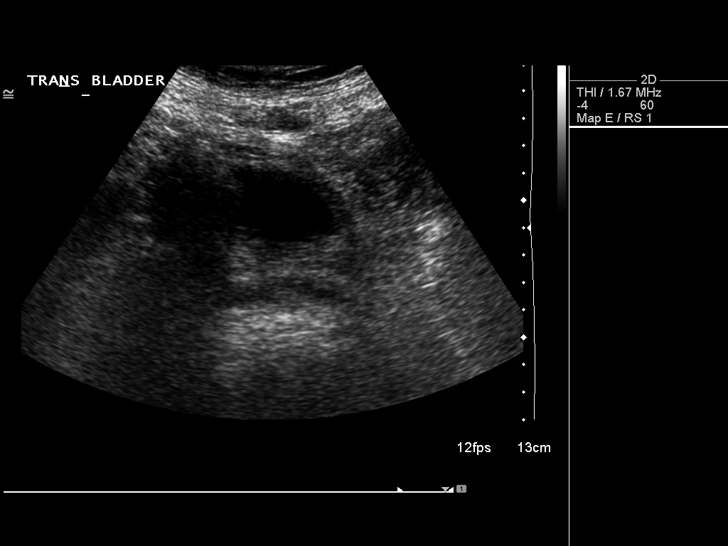

[14 of 25 positions shown; findings below may reference images not displayed]

FINDINGS: Right Kidney:  10.6 cm in length. Normal renal cortical thickness
and echogenicity without focal lesions or hydronephrosis.

Left Kidney:  11.1 cm in length. Normal renal cortical thickness
and echogenicity without focal lesions or hydronephrosis.

Bladder:  Normal bladder.  No significant postvoid residual.
Median lobe hypertrophy of the prostate gland is noted.
IMPRESSION: 1.  Normal sonographic appearance of both kidneys.
2.  Normal bladder.

## 2013-03-26 ENCOUNTER — Ambulatory Visit (INDEPENDENT_AMBULATORY_CARE_PROVIDER_SITE_OTHER): Payer: Medicare Other

## 2013-03-26 ENCOUNTER — Ambulatory Visit (INDEPENDENT_AMBULATORY_CARE_PROVIDER_SITE_OTHER): Payer: Medicare Other | Admitting: Internal Medicine

## 2013-03-26 ENCOUNTER — Encounter: Payer: Self-pay | Admitting: Internal Medicine

## 2013-03-26 VITALS — BP 122/70 | HR 84 | Temp 98.0°F | Resp 12 | Wt 244.0 lb

## 2013-03-26 DIAGNOSIS — R209 Unspecified disturbances of skin sensation: Secondary | ICD-10-CM

## 2013-03-26 DIAGNOSIS — Z0000 Encounter for general adult medical examination without abnormal findings: Secondary | ICD-10-CM

## 2013-03-26 DIAGNOSIS — Z789 Other specified health status: Secondary | ICD-10-CM

## 2013-03-26 DIAGNOSIS — R202 Paresthesia of skin: Secondary | ICD-10-CM

## 2013-03-26 DIAGNOSIS — Z9189 Other specified personal risk factors, not elsewhere classified: Secondary | ICD-10-CM

## 2013-03-26 DIAGNOSIS — E785 Hyperlipidemia, unspecified: Secondary | ICD-10-CM

## 2013-03-26 NOTE — Patient Instructions (Addendum)
1. Numbness - hands and feet. Exam does not reveal any central nervous system abnormalities. Plan Will check Thyroid and B12 levels  If labs are normal and this problem continues will consider nerve conduction studies to look for nerve damage.  2. Risk for coronary artery disease - family history, male gender, hypertension, weight Plan  CT cardiac scoring  Exercise treadmill.  3. Colon health - if you had a normal study 2 years ago the recommendation, even with a prior polyp and a family history, is follow up in 5 years. Will order the study but it may not be covered.

## 2013-03-26 NOTE — Progress Notes (Signed)
Subjective:    Patient ID: John Blackburn, male    DOB: 19-May-1946, 67 y.o.   MRN: [id number]  HPI John Blackburn presents with a c/o paresthesia of feet which is constant and hands which is intermittent. The feet have been numb for several months and the hands have been numb for a couple of months. Denies weakness in the hands. No burning sensation in hands but occasionally in the feet.   He has a very strong family h/o sudden cardiac death in late 85's early 40's. He had a Sestamibi Nuclear Stress test oin Aug '09: EF 57%, no ischemia. EKGs have been normal. He has been asymptomatic. Risk factors: family history, male gender, hypertension, obesity. Negative for tobacco use, lipid disorder.  Past Medical History  Diagnosis Date  . 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 Depression     on multiple meds. Has been seen at [facility]. and Dr. Dub Mikes is his prescriber  . Diverticulitis of colon 2000 's    treated as an outpatient  . GERD (gastroesophageal reflux disease)     UGI done August '12 - ulcer/. Dr. Noe Gens, gastroenterologist in [facility].   . Peptic ulcer     in the past and just recently-August '12  . Hypertension   . Hyperlipidemia     last lipid panel: HDL 44, LDL 117  . Hx of adenomatous colonic polyps   . Sleep apnea, primary central   . skin cancer   . Allergy    Past Surgical History  Procedure Laterality Date  . Cholecystectomy  1990    laproscopic   . Tkr bilateral  2006    Dr. Erskine Squibb  . Joint replacement      knee  . Prostate surgery      needle biopsy's  . Hernia repair    . Vasectomy     Family History  Problem Relation Age of Onset  . Stroke Mother   . Hypertension Mother   . Heart disease Mother   . Heart disease Father     CAD/MI-fatal sudden death  . Cancer Sister     colon cancer 20090-survivor  . Heart disease Paternal Uncle   . Heart disease Paternal Grandfather    History   Social History  . Marital Status:  Divorced    Spouse Name: N/A    Number of Children: 3  . Years of Education: 16   Occupational History  . contractor    Social History Main Topics  . Smoking status: Never Smoker   . Smokeless tobacco: Never Used  . Alcohol Use: 0.5 oz/week    1 drink(s) per week     Comment: social  . Drug Use: No  . Sexually Active: Not Currently -- Male partner(s)   Other Topics Concern  . 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 3 sons - '74, '76, '87.  1 grandchild.   Work - Chief Executive Officer. Lives alone. No pets. Serially monogamous.   ACP - he has a living will: DNR, DNI.    Current Outpatient Prescriptions on File Prior to Visit  Medication Sig Dispense Refill  . atenolol-chlorthalidone (TENORETIC) 50-25 MG per tablet TAKE ONE TABLET BY MOUTH EVERY DAY  30 tablet  5  . Multiple Vitamins-Minerals (CENTRUM SILVER PO) Take 1 tablet by mouth.         No current facility-administered  medications on file prior to visit.       Review of Systems System review is negative for any constitutional, cardiac, pulmonary, GI or neuro symptoms or complaints other than as described in the HPI.        Objective:   Physical Exam Filed Vitals:   03/26/13 1534  BP: 122/70  Pulse: 84  Temp: 98 F (36.7 C)  Resp: 12   Wt Readings from Last 3 Encounters:  03/26/13 244 lb (110.678 kg)  09/05/12 239 lb (108.41 kg)  05/15/12 229 lb (103.874 kg)   BP Readings from Last 3 Encounters:  03/26/13 122/70  09/05/12 144/73  05/15/12 122/80   Gen'l - WNWD overweight white man in no distress HEENT - C&S clear Cor 2+ radial pulse, RRR with frequent PVCs, no carotid bruits Pulm - normal respirations Neuro - A&O x 3, CN II-XII normal, sensation well preserved to light touch, pin prick and vibration in the hands; well preserved to light touch and pin prick feet with mild decreased vibration. DTR's 2+ . Normal  gait and station.       Assessment & Plan:  1. Risk for CAD - patient with a high level of concern due to very strong family history of CAD/MI, and other risk factors including Male gender, overweight.  Plan CT cardiac scoring  GXT  2. Paresthesia - hands and feet both involved. ON exam there is diminished deep vibratory sensation  Plan R/o metabolic etiology: check thyroid function and B12  If no metabolic derangement will consider NCS

## 2013-03-27 LAB — COMPREHENSIVE METABOLIC PANEL
ALT: 17 U/L (ref 0–53)
AST: 24 U/L (ref 0–37)
Alkaline Phosphatase: 56 U/L (ref 39–117)
CO2: 29 mEq/L (ref 19–32)
Creatinine, Ser: 0.8 mg/dL (ref 0.4–1.5)
Sodium: 142 mEq/L (ref 135–145)
Total Bilirubin: 1 mg/dL (ref 0.3–1.2)
Total Protein: 7.2 g/dL (ref 6.0–8.3)

## 2013-03-27 LAB — LDL CHOLESTEROL, DIRECT: Direct LDL: 64.5 mg/dL

## 2013-03-27 LAB — TSH: TSH: 0.65 u[IU]/mL (ref 0.35–5.50)

## 2013-03-29 ENCOUNTER — Ambulatory Visit (INDEPENDENT_AMBULATORY_CARE_PROVIDER_SITE_OTHER)
Admission: RE | Admit: 2013-03-29 | Discharge: 2013-03-29 | Disposition: A | Discharge status: Home / Self Care | Payer: Medicare Other | Source: Ambulatory Visit | Attending: Internal Medicine | Admitting: Internal Medicine

## 2013-03-29 DIAGNOSIS — Z789 Other specified health status: Secondary | ICD-10-CM

## 2013-03-29 DIAGNOSIS — Z9189 Other specified personal risk factors, not elsewhere classified: Secondary | ICD-10-CM

## 2013-03-29 IMAGING — CT CT HEART SCORING
1 of 4 series · 8 of 20 positions shown, 10 images · non-contrast
Comparison: none

***ADDENDUM*** CREATED: [DATE] [DATE]

CALCIUM SCORE [DATE] [DATE]
Ordering Physician: GENKAWA
GENKAWA Physician: [email or web address].GENKAWA
PROTOCOL: The patient scanned on a Siemens sensations 16 slice
scanner.  After an initial AP and lateral topogram, 3 mm axial
slices were performed through the heart for calcium scoring.
Indications: Coronary risk factors
DETAILED FINDINGS:
Coronary Calcium Score: 29 Agatston units.  The coronary calcium
was in the proximal LAD.
Noncardiac findings to be reported by [REDACTED].

[Series 6: st thins for reformat · axial · 0.68mm/px · z∈[-166,-20]mm · 8 of 188 slices shown, 10 images]
[im 21/188  vessel]
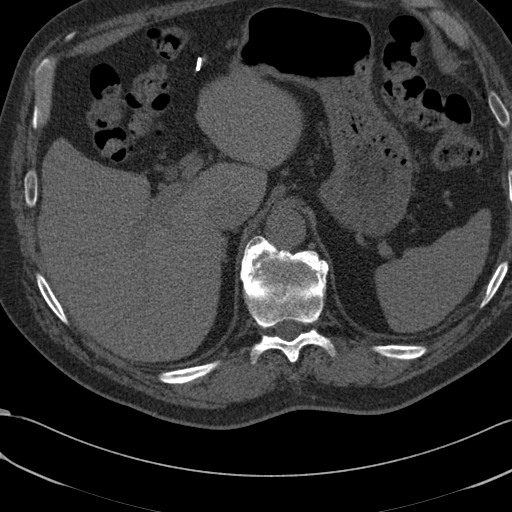
[im 21/188  lung]
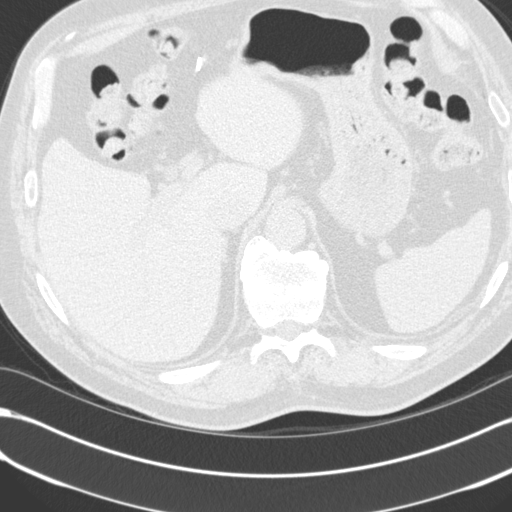
[im 42/188  vessel]
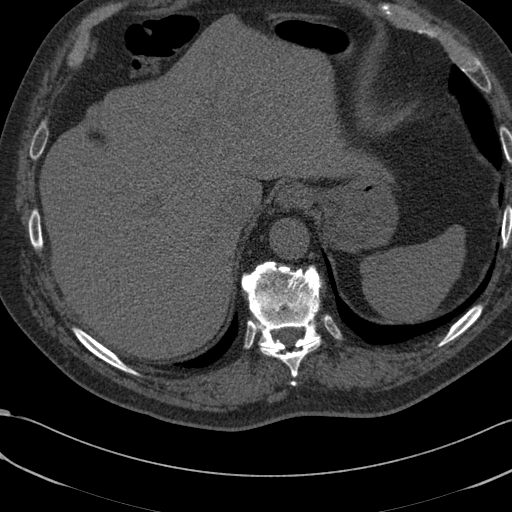
[im 63/188  vessel]
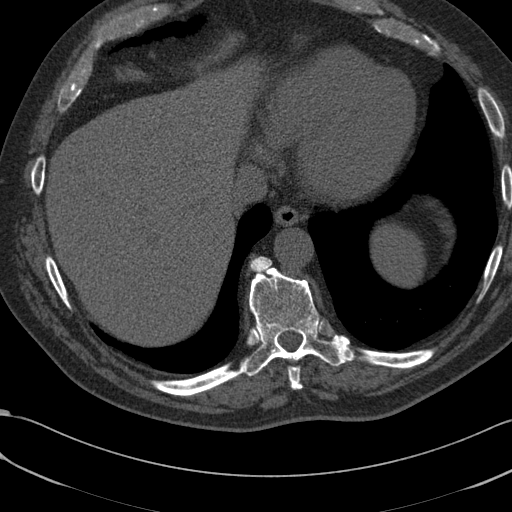
[im 84/188  vessel]
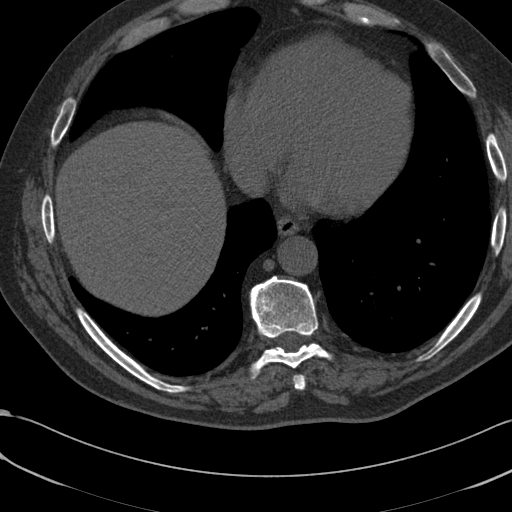
[im 104/188  vessel]
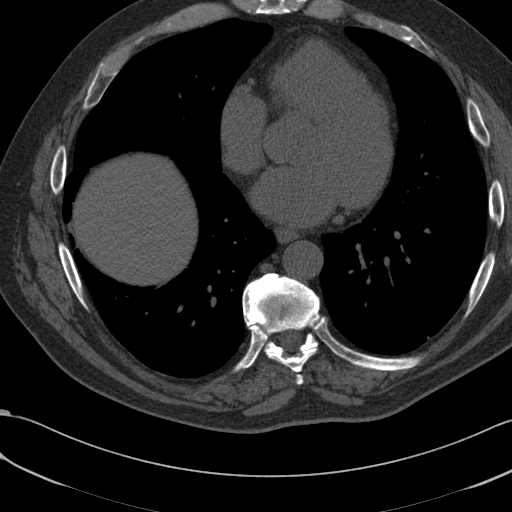
[im 104/188  lung]
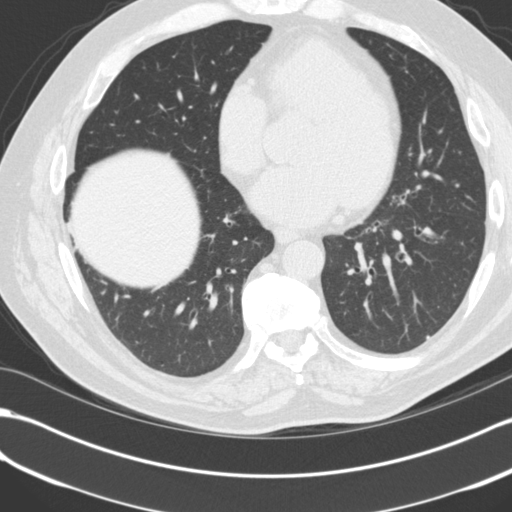
[im 125/188  vessel]
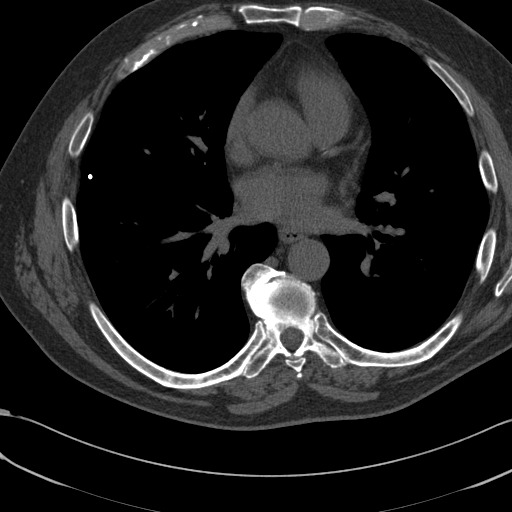
[im 146/188  vessel]
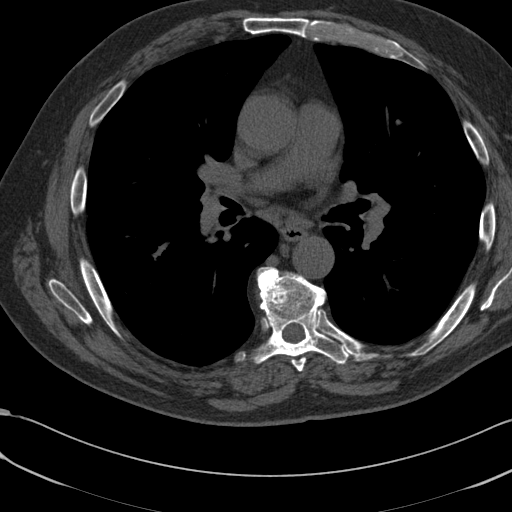
[im 167/188  vessel]
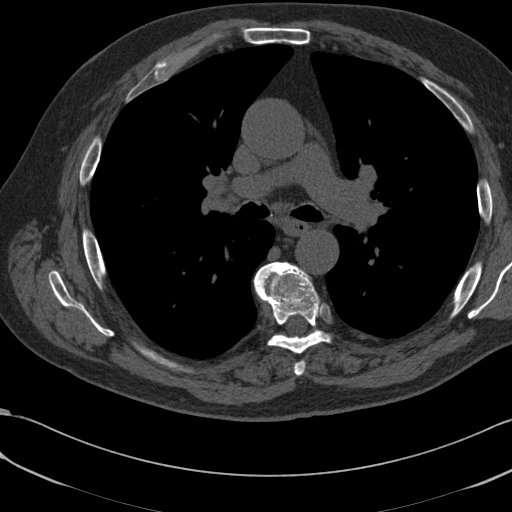

[8 of 20 positions shown; findings below may reference images not displayed]

IMPRESSION: Coronary artery calcium score of 29 Agatston units places the
patient in the 33rd percentile for his age and gender.  This is a
low risk cohort.

***END ADDENDUM*** SIGNED BY: GENKAWA
OVER-READ INTERPRETATION - CT CHEST

The following report is an over-read performed by radiologist Dr.
[DATE].  This over-read does not include interpretation of
cardiac or coronary anatomy or pathology.  The coronary calcium
score interpretation by the cardiologist is attached.
FINDINGS: Lung windows demonstrate scattered calcified nodules,
consistent with old granulomatous disease.

Soft tissue windows demonstrate No pericardial or pleural fluid.

Increased number of mediastinal lymph nodes.  9 mm subcarinal node.
A prevascular node measures 8 mm on image 2/series 5.  Upper normal
in size. Hilar regions poorly evaluated without intravenous
contrast.

Limited abdominal imaging demonstrates cholecystectomy clips.

Thoracic spondylosis.

IMPRESSION

1.  No acute extracardiac findings within the chest.
2.   Increased number of thoracic nodes with borderline enlarged
prevascular nodes.  Favored to be reactive.  If there is any
history of malignancy or a clinical concern of
malignancy/lymphoproliferative disorder, consider follow-up chest
CT at 6 months.

## 2013-04-08 ENCOUNTER — Other Ambulatory Visit: Payer: Self-pay | Admitting: Urology

## 2013-04-08 DIAGNOSIS — R972 Elevated prostate specific antigen [PSA]: Secondary | ICD-10-CM

## 2013-04-08 DIAGNOSIS — R350 Frequency of micturition: Secondary | ICD-10-CM

## 2013-04-08 DIAGNOSIS — R109 Unspecified abdominal pain: Secondary | ICD-10-CM

## 2013-04-10 ENCOUNTER — Other Ambulatory Visit: Payer: Medicare Other

## 2013-04-16 ENCOUNTER — Encounter: Payer: Self-pay | Admitting: Cardiology

## 2013-04-17 ENCOUNTER — Encounter: Payer: Self-pay | Admitting: Cardiovascular Disease

## 2013-04-23 ENCOUNTER — Other Ambulatory Visit: Payer: Self-pay | Admitting: Internal Medicine

## 2013-04-24 ENCOUNTER — Ambulatory Visit (INDEPENDENT_AMBULATORY_CARE_PROVIDER_SITE_OTHER): Payer: Medicare Other | Admitting: Nurse Practitioner

## 2013-04-24 ENCOUNTER — Encounter: Payer: Self-pay | Admitting: Nurse Practitioner

## 2013-04-24 VITALS — BP 125/84 | HR 75

## 2013-04-24 DIAGNOSIS — Z9189 Other specified personal risk factors, not elsewhere classified: Secondary | ICD-10-CM

## 2013-04-24 DIAGNOSIS — Z8249 Family history of ischemic heart disease and other diseases of the circulatory system: Secondary | ICD-10-CM

## 2013-04-24 DIAGNOSIS — I4949 Other premature depolarization: Secondary | ICD-10-CM

## 2013-04-24 DIAGNOSIS — Z789 Other specified health status: Secondary | ICD-10-CM

## 2013-04-24 DIAGNOSIS — I493 Ventricular premature depolarization: Secondary | ICD-10-CM

## 2013-04-24 NOTE — Progress Notes (Signed)
Exercise Treadmill Test  Pre-Exercise Testing Evaluation Rhythm: normal sinus  Rate: 69     Test  Exercise Tolerance Test Ordering MD: Lewayne Bunting, MD  Interpreting MD: Norma Fredrickson, NP  Unique Test No: 1  Treadmill:  1  Indication for ETT: At risk for CAD  Contraindication to ETT: No   Stress Modality: exercise - treadmill  Cardiac Imaging Performed: non   Protocol: standard Bruce - maximal  Max BP:  204/109  Max MPHR (bpm):  153 85% MPR (bpm):  130  MPHR obtained (bpm):  120 % MPHR obtained:  78%  Reached 85% MPHR (min:sec):  NA Total Exercise Time (min-sec):  5 minutes  Workload in METS:  7.0 Borg Scale: 15  Reason ETT Terminated:  fatigue    ST Segment Analysis At Rest: normal ST segments - no evidence of significant ST depression With Exercise: borderline ST changes  Other Information Arrhythmia:  Yes Angina during ETT:  absent (0) Quality of ETT:  non-diagnostic  ETT Interpretation:  abnormal - evidence of ST depression consistent with ischemia  Comments: Patient presents today for routine GXT. Has not been seen here in over 5 years. No current symptoms but has multiple CV risk factors and a positive family history of CAD.   Today he exercised on the standard Bruce protocol for a total of 5 minutes. Poor exercise tolerance - reduced from prior study of 2009 (went 8 minutes). Hypertensive BP response. Clinically negative. EKG with ST/T wave changes in recovery to suggest ischemia. Very frequent PVC's, couplets, runs of bigeminy.   Recommendations: Echo Lexiscan Myoview  Further disposition to follow.   Patient is agreeable to this plan and will call if any problems develop in the interim.   Rosalio Macadamia, RN, ANP-C [facility] [address] [place], [place]  [address]

## 2013-04-25 ENCOUNTER — Ambulatory Visit (HOSPITAL_COMMUNITY): Payer: Medicare Other | Attending: Cardiology | Admitting: Radiology

## 2013-04-25 DIAGNOSIS — Z8249 Family history of ischemic heart disease and other diseases of the circulatory system: Secondary | ICD-10-CM | POA: Insufficient documentation

## 2013-04-25 DIAGNOSIS — Z9189 Other specified personal risk factors, not elsewhere classified: Secondary | ICD-10-CM

## 2013-04-25 DIAGNOSIS — R9431 Abnormal electrocardiogram [ECG] [EKG]: Secondary | ICD-10-CM

## 2013-04-25 DIAGNOSIS — I4949 Other premature depolarization: Secondary | ICD-10-CM | POA: Insufficient documentation

## 2013-04-25 DIAGNOSIS — I493 Ventricular premature depolarization: Secondary | ICD-10-CM

## 2013-04-25 NOTE — Progress Notes (Signed)
Echocardiogram performed.  

## 2013-04-26 ENCOUNTER — Telehealth: Payer: Self-pay | Admitting: Nurse Practitioner

## 2013-04-26 NOTE — Telephone Encounter (Signed)
New Problem  Returning a call from [place] about results

## 2013-05-13 ENCOUNTER — Other Ambulatory Visit: Payer: Self-pay | Admitting: Cardiovascular Disease

## 2013-05-14 ENCOUNTER — Ambulatory Visit (HOSPITAL_COMMUNITY): Payer: Medicare Other | Attending: Cardiovascular Disease | Admitting: Radiology

## 2013-05-14 VITALS — BP 124/77 | Ht 73.0 in | Wt 241.0 lb

## 2013-05-14 DIAGNOSIS — R9439 Abnormal result of other cardiovascular function study: Secondary | ICD-10-CM

## 2013-05-14 DIAGNOSIS — Z9189 Other specified personal risk factors, not elsewhere classified: Secondary | ICD-10-CM

## 2013-05-14 DIAGNOSIS — I10 Essential (primary) hypertension: Secondary | ICD-10-CM | POA: Insufficient documentation

## 2013-05-14 DIAGNOSIS — E785 Hyperlipidemia, unspecified: Secondary | ICD-10-CM | POA: Insufficient documentation

## 2013-05-14 DIAGNOSIS — E669 Obesity, unspecified: Secondary | ICD-10-CM | POA: Insufficient documentation

## 2013-05-14 DIAGNOSIS — I493 Ventricular premature depolarization: Secondary | ICD-10-CM

## 2013-05-14 DIAGNOSIS — R5381 Other malaise: Secondary | ICD-10-CM | POA: Insufficient documentation

## 2013-05-14 DIAGNOSIS — Z8249 Family history of ischemic heart disease and other diseases of the circulatory system: Secondary | ICD-10-CM

## 2013-05-14 MED ORDER — TECHNETIUM TC 99M SESTAMIBI GENERIC - CARDIOLITE
30.0000 | Freq: Once | INTRAVENOUS | Status: AC | PRN
  Administered 2013-05-14: 30 via INTRAVENOUS

## 2013-05-14 MED ORDER — REGADENOSON 0.4 MG/5ML IV SOLN
0.4000 mg | Freq: Once | INTRAVENOUS | Status: AC
  Administered 2013-05-14: 0.4 mg via INTRAVENOUS

## 2013-05-14 MED ORDER — TECHNETIUM TC 99M SESTAMIBI GENERIC - CARDIOLITE
10.0000 | Freq: Once | INTRAVENOUS | Status: AC | PRN
  Administered 2013-05-14: 10 via INTRAVENOUS

## 2013-05-14 NOTE — Progress Notes (Signed)
  [facility] SITE 3 NUCLEAR MED [address] [place], [place] [address] [phone]    Cardiology Nuclear Med Study  John Blackburn is a 67 y.o. male     MRN : [id number]     DOB: 1946-05-19  Procedure Date: 05/14/2013  Nuclear Med Background Indication for Stress Test:  Evaluation for Ischemia and Abnormal GXT History:  '00 MPS:EF=57%,normal;04/24/13 OZH:[id number];9/62/95 Echo: Ef=55-60% Cardiac Risk Factors: Family History - CAD, Hypertension, Lipids and Obesity  Symptoms:  Fatigue   Nuclear Pre-Procedure Caffeine/Decaff Intake:  None NPO After: 10:00pm   Lungs:  clear O2 Sat: 98% on room air. IV 0.9% NS with Angio Cath:  20g  IV Site: R Wrist  IV Started by:  Cathlyn Parsons, RN  Chest Size (in):  50 Cup Size: n/a  Height: 6\' 1"  (1.854 m)  Weight:  241 lb (109.317 kg)  BMI:  Body mass index is 31.8 kg/(m^2). Tech Comments:  n/a    Nuclear Med Study 1 or 2 day study: 1 day  Stress Test Type:  Treadmill/Lexiscan  Reading MD: Charlton Haws, MD  Order Authorizing Provider:  Harriette Bouillon and Norma Fredrickson ,NP  Resting Radionuclide: Technetium 78m Sestamibi  Resting Radionuclide Dose: 10.8 mCi   Stress Radionuclide:  Technetium 89m Sestamibi  Stress Radionuclide Dose: 33.0 mCi           Stress Protocol Rest HR: 43 Stress HR: 71  Rest BP: 124/77 Stress BP: 150/72  Exercise Time (min): 2:00 METS: 1.7   Predicted Max HR: 153 bpm % Max HR: 46.41 bpm Rate Pressure Product: 28413   Dose of Adenosine (mg):  n/a Dose of Lexiscan: 0.4 mg  Dose of Atropine (mg): n/a Dose of Dobutamine: n/a mcg/kg/min (at max HR)  Stress Test Technologist: Cathlyn Parsons, RN  Nuclear Technologist:  Leonia Corona, RT-N     Rest Procedure:  Myocardial perfusion imaging was performed at rest 45 minutes following the intravenous administration of Technetium 32m Sestamibi. Rest ECG: NSR - Normal EKG  Stress Procedure:  The patient received IV Lexiscan 0.4 mg over 15-seconds  with concurrent low level exercise and then Technetium 83m Sestamibi was injected at 30-seconds while the patient continued walking one more minute.  Quantitative spect images were obtained after a 45-minute delay. Stress ECG: No significant change from baseline ECG  QPS Raw Data Images:  Normal; no motion artifact; normal heart/lung ratio. Stress Images:  Normal homogeneous uptake in all areas of the myocardium. Rest Images:  Normal homogeneous uptake in all areas of the myocardium. Subtraction (SDS):  Normal Transient Ischemic Dilatation (Normal <1.22):  n/a Lung/Heart Ratio (Normal <0.45):  0.43  Quantitative Gated Spect Images QGS EDV:  152 ml QGS ESV:  59 ml  Impression Exercise Capacity:  Lexiscan with low level exercise. BP Response:  Normal blood pressure response. Clinical Symptoms:  No significant symptoms noted. ECG Impression:  No significant ST segment change suggestive of ischemia. Comparison with Prior Nuclear Study: No images to compare  Overall Impression:  Normal stress nuclear study.  LV Ejection Fraction: 61%.  LV Wall Motion:  NL LV Function; NL Wall Motion  Charlton Haws

## 2013-06-05 ENCOUNTER — Telehealth: Payer: Self-pay | Admitting: Nurse Practitioner

## 2013-06-05 NOTE — Telephone Encounter (Signed)
PT AWARE OF  STRESS RESULTS  INFORMED  PT   RESULTS  HAD  ALREADY  BEEN GIVEN  REVIEWED ONCE  AGAIN./CY

## 2013-06-05 NOTE — Telephone Encounter (Signed)
New Problem:  Pt states he would like to hear the results of his Nuc study from Sept.

## 2013-06-25 ENCOUNTER — Ambulatory Visit (HOSPITAL_COMMUNITY)
Admission: RE | Admit: 2013-06-25 | Discharge: 2013-06-25 | Disposition: A | Discharge status: Home / Self Care | Payer: Medicare Other | Source: Ambulatory Visit | Attending: Internal Medicine | Admitting: Internal Medicine

## 2013-06-25 ENCOUNTER — Other Ambulatory Visit: Payer: Medicare Other

## 2013-06-25 ENCOUNTER — Encounter (HOSPITAL_COMMUNITY): Payer: Self-pay

## 2013-06-25 ENCOUNTER — Inpatient Hospital Stay (HOSPITAL_COMMUNITY)
Admission: AD | Admit: 2013-06-25 | Discharge: 2013-06-29 | DRG: 176 | Disposition: A | Discharge status: Home Health | Payer: Medicare Other | Source: Ambulatory Visit | Attending: Internal Medicine | Admitting: Internal Medicine

## 2013-06-25 ENCOUNTER — Encounter: Payer: Self-pay | Admitting: Internal Medicine

## 2013-06-25 ENCOUNTER — Ambulatory Visit (INDEPENDENT_AMBULATORY_CARE_PROVIDER_SITE_OTHER): Payer: Medicare Other | Admitting: Internal Medicine

## 2013-06-25 VITALS — BP 120/84 | HR 79 | Temp 97.4°F | Wt 233.8 lb

## 2013-06-25 DIAGNOSIS — E785 Hyperlipidemia, unspecified: Secondary | ICD-10-CM | POA: Diagnosis present

## 2013-06-25 DIAGNOSIS — R0602 Shortness of breath: Secondary | ICD-10-CM | POA: Diagnosis present

## 2013-06-25 DIAGNOSIS — I10 Essential (primary) hypertension: Secondary | ICD-10-CM

## 2013-06-25 DIAGNOSIS — N400 Enlarged prostate without lower urinary tract symptoms: Secondary | ICD-10-CM

## 2013-06-25 DIAGNOSIS — T45515A Adverse effect of anticoagulants, initial encounter: Secondary | ICD-10-CM | POA: Diagnosis present

## 2013-06-25 DIAGNOSIS — R310 Gross hematuria: Secondary | ICD-10-CM | POA: Diagnosis present

## 2013-06-25 DIAGNOSIS — I2692 Saddle embolus of pulmonary artery without acute cor pulmonale: Principal | ICD-10-CM | POA: Diagnosis present

## 2013-06-25 DIAGNOSIS — R079 Chest pain, unspecified: Secondary | ICD-10-CM | POA: Diagnosis present

## 2013-06-25 DIAGNOSIS — F3289 Other specified depressive episodes: Secondary | ICD-10-CM | POA: Diagnosis present

## 2013-06-25 DIAGNOSIS — E440 Moderate protein-calorie malnutrition: Secondary | ICD-10-CM | POA: Diagnosis present

## 2013-06-25 DIAGNOSIS — F329 Major depressive disorder, single episode, unspecified: Secondary | ICD-10-CM | POA: Diagnosis present

## 2013-06-25 DIAGNOSIS — Z96659 Presence of unspecified artificial knee joint: Secondary | ICD-10-CM

## 2013-06-25 LAB — PROTIME-INR
INR: 1.15 (ref 0.00–1.49)
Prothrombin Time: 14.5 seconds (ref 11.6–15.2)

## 2013-06-25 LAB — COMPREHENSIVE METABOLIC PANEL
ALT: 24 U/L (ref 0–53)
AST: 25 U/L (ref 0–37)
Albumin: 3.8 g/dL (ref 3.5–5.2)
Alkaline Phosphatase: 65 U/L (ref 39–117)
BUN: 17 mg/dL (ref 6–23)
Creatinine, Ser: 1.1 mg/dL (ref 0.4–1.5)
Potassium: 4.3 mEq/L (ref 3.5–5.1)

## 2013-06-25 LAB — MRSA PCR SCREENING: MRSA by PCR: NEGATIVE

## 2013-06-25 LAB — HEPARIN LEVEL (UNFRACTIONATED): Heparin Unfractionated: 0.21 IU/mL — ABNORMAL LOW (ref 0.30–0.70)

## 2013-06-25 IMAGING — CT CT ANGIO CHEST
2 of 6 series · 19 of 36 positions shown · IV contrast (omnipaque)
Comparison: [DATE]

CLINICAL DATA: Evaluate for pulmonary embolus

EXAM:
CT ANGIOGRAPHY CHEST WITH CONTRAST
TECHNIQUE: Multidetector CT imaging of the chest was performed using the
standard protocol during bolus administration of intravenous
contrast. Multiplanar CT image reconstructions including MIPs were
obtained to evaluate the vascular anatomy.
CONTRAST:  100mL OMNIPAQUE IOHEXOL 350 MG/ML SOLN

[Series 6: pe thins @ 1mm · axial · 0.74mm/px · z∈[-287,-23]mm · 18 of 294 slices shown]
[im 15/294  lung]
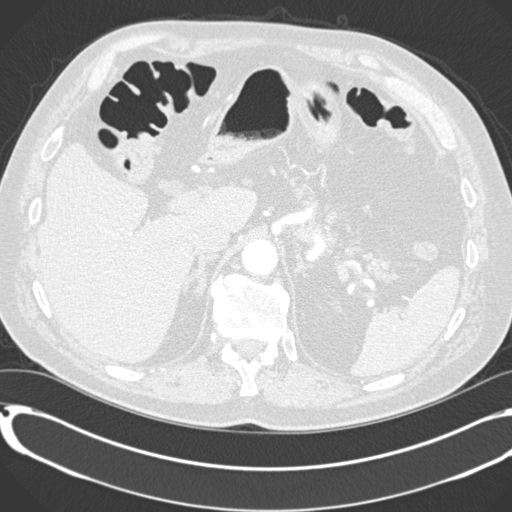
[im 30/294  mediastinal]
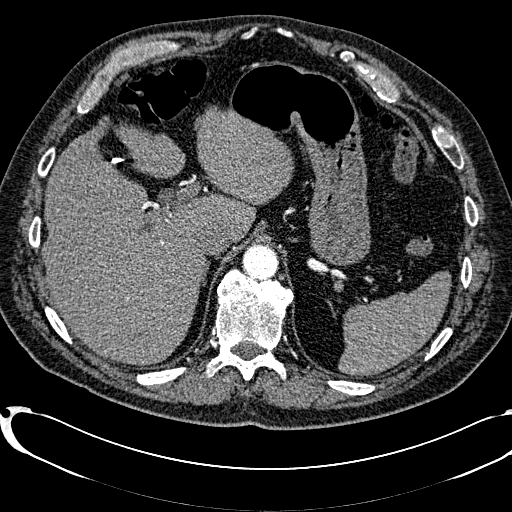
[im 44/294  lung]
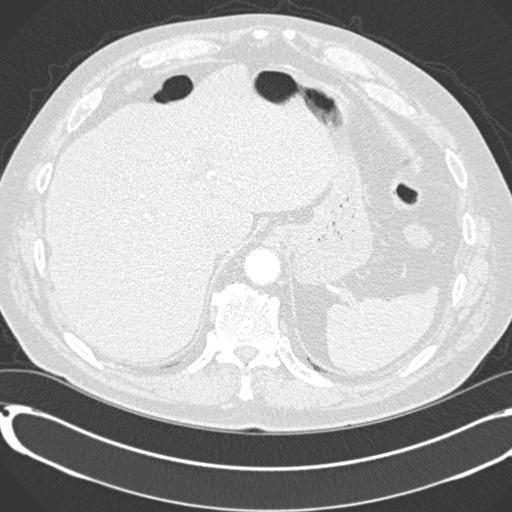
[im 59/294  mediastinal]
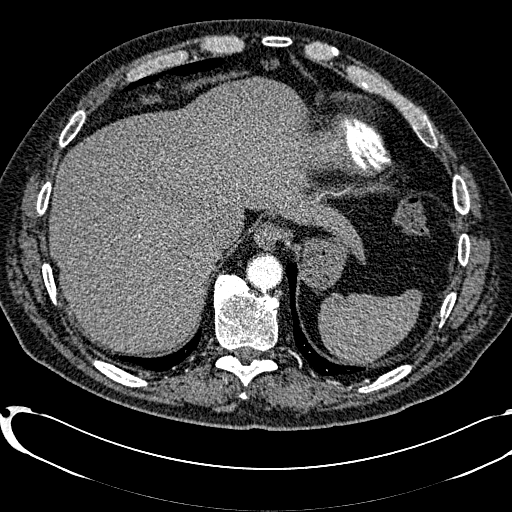
[im 74/294  lung]
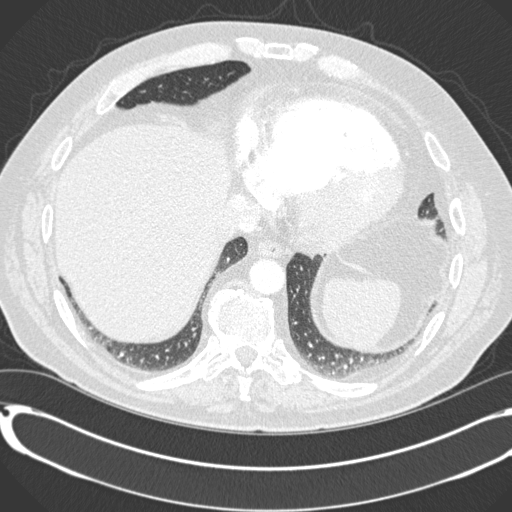
[im 88/294  mediastinal]
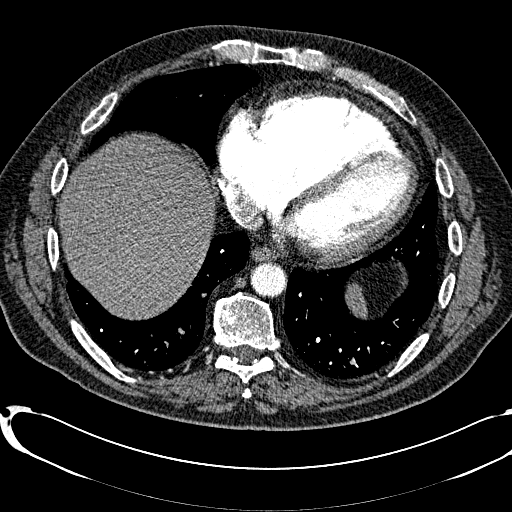
[im 103/294  lung]
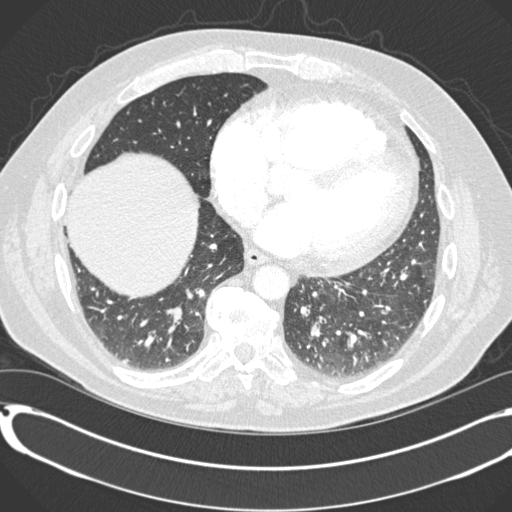
[im 118/294  mediastinal]
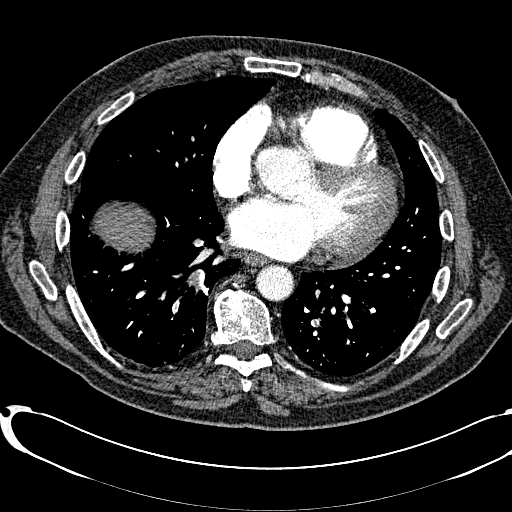
[im 132/294  lung]
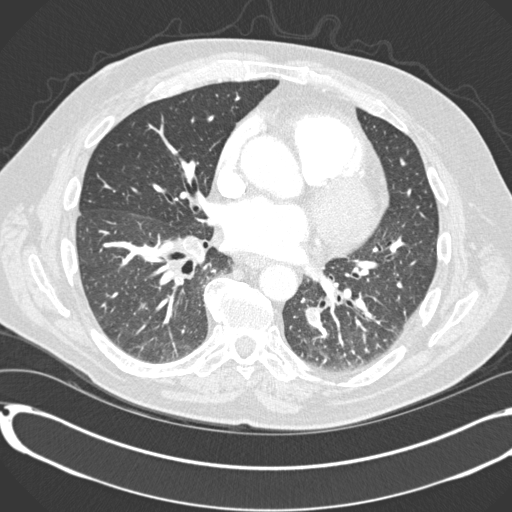
[im 162/294  mediastinal]
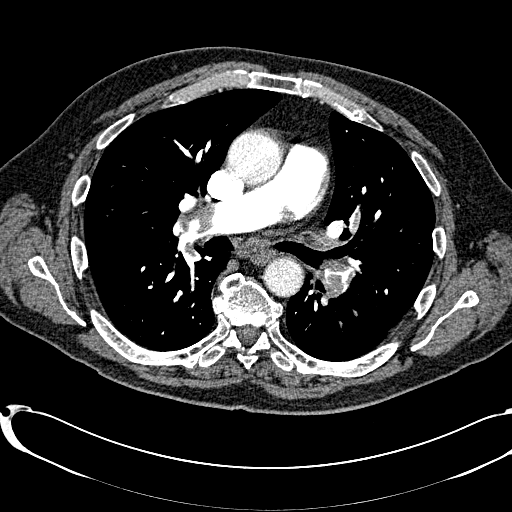
[im 176/294  lung]
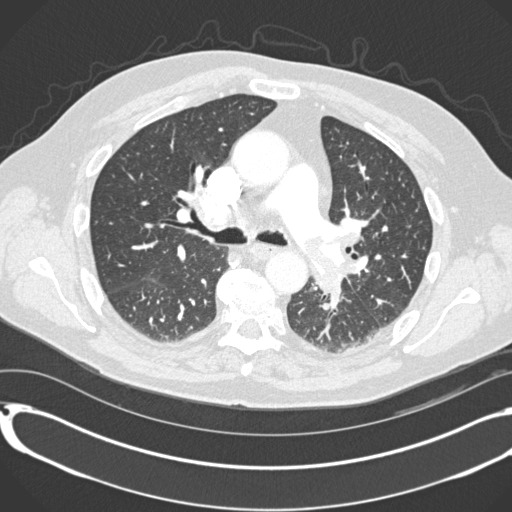
[im 191/294  mediastinal]
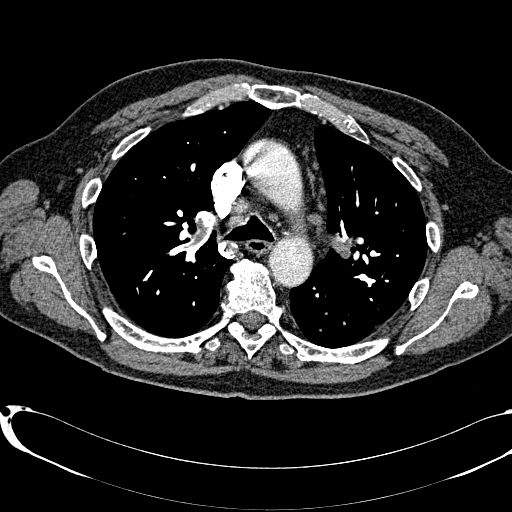
[im 206/294  lung]
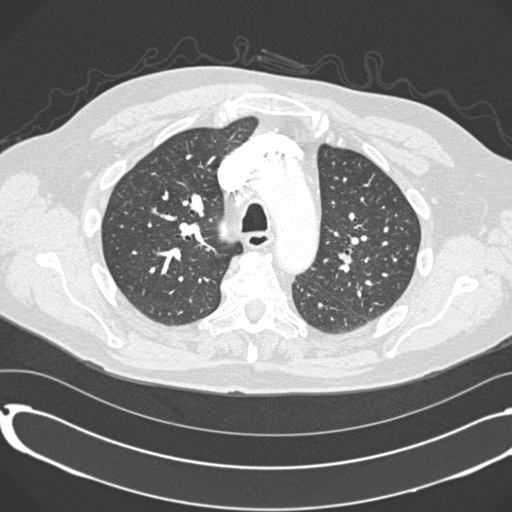
[im 220/294  mediastinal]
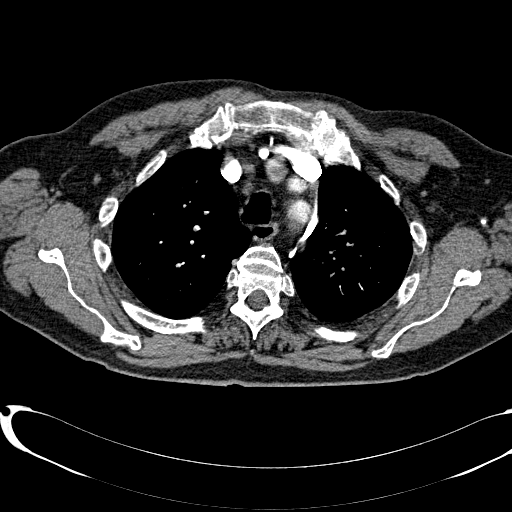
[im 235/294  lung]
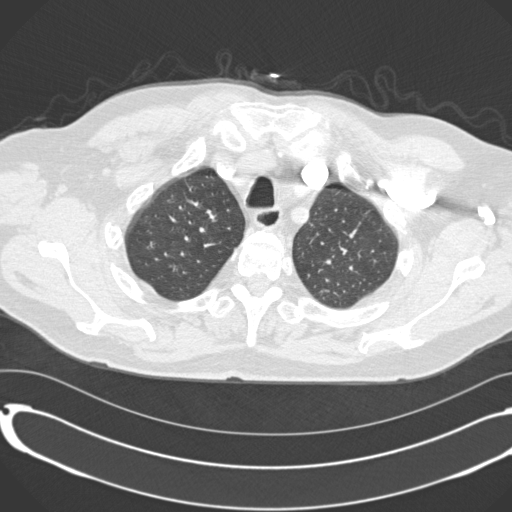
[im 250/294  mediastinal]
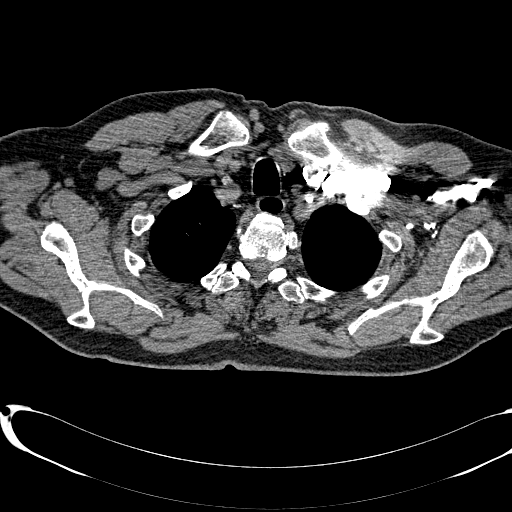
[im 264/294  lung]
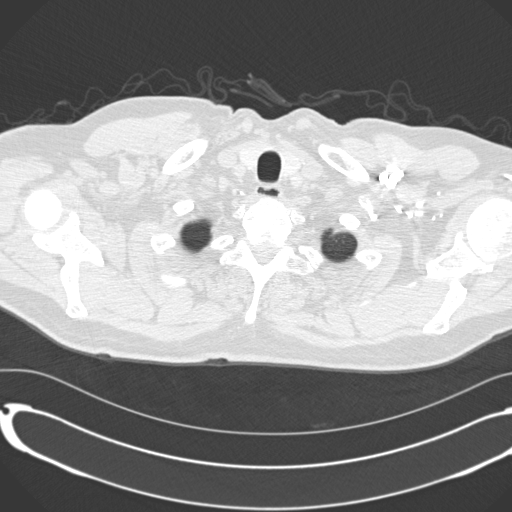
[im 279/294  mediastinal]
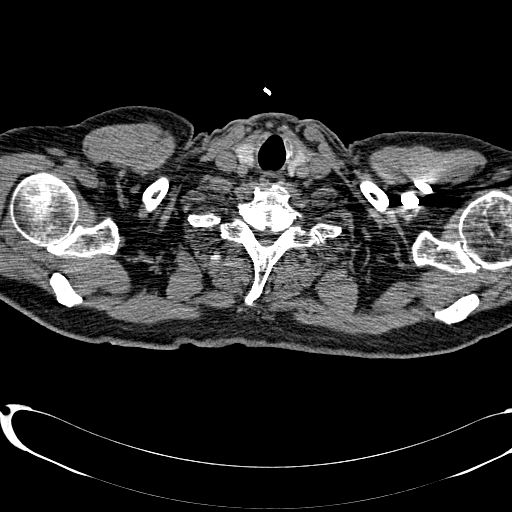

[Series 602: cor · coronal · 0.74mm/px · 1 of 130 slices shown]
[im 65/130  mediastinal]
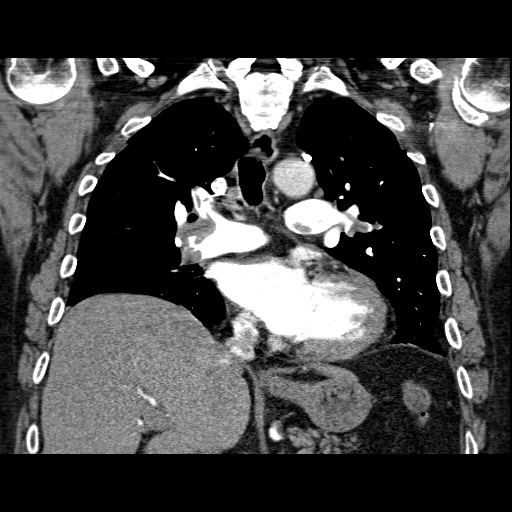

[19 of 36 positions shown; findings below may reference images not displayed]

FINDINGS: Thereis no pleural effusion identified. No airspace consolidation
identified. Calcified granuloma is identified within the right upper
lobe, image 58/ series 7. The trachea is patent and is midline.
There is mild cardiac enlargement. There are large bilateral
occlusive thrombi involving the right and left main pulmonary
arteries. A saddle embolus is also present. There is a upper limits
of normal right paratracheal lymph node which measures 1 cm. Sub-
carinal lymph node is mildly enlarged limits of normal measuring
cm. There is no axillary or supraclavicular adenopathy.

Limited imaging through the upper abdomen shows mild low attenuation
within the liver. No worrisome abnormalities identified.

Review of the visualized osseous structures is significant for multi
level lumbar degenerative disc disease. No aggressive lytic or
sclerotic bone lesion identified.

Review of the MIP images confirms the above findings.
IMPRESSION: 1. Examination is positive for acute, bilateral pulmonary emboli.

2.  Prior granulomatous disease.

Critical Value/emergent results were called by telephone at the time
of interpretation on [DATE] at [DATE] to Dr.DONNIK ,
who verbally acknowledged these results.

## 2013-06-25 MED ORDER — HEPARIN (PORCINE) IN NACL 100-0.45 UNIT/ML-% IJ SOLN
1500.0000 [IU]/h | INTRAMUSCULAR | Status: DC
  Administered 2013-06-25: 1500 [IU]/h via INTRAVENOUS
  Filled 2013-06-25 (×2): qty 250

## 2013-06-25 MED ORDER — ZOLPIDEM TARTRATE 5 MG PO TABS
5.0000 mg | ORAL_TABLET | Freq: Every evening | ORAL | Status: DC | PRN
  Administered 2013-06-26 – 2013-06-28 (×4): 5 mg via ORAL
  Filled 2013-06-25 (×4): qty 1

## 2013-06-25 MED ORDER — ACETAMINOPHEN 650 MG RE SUPP: 650.0000 mg | Freq: Four times a day (QID) | RECTAL | Status: DC | PRN

## 2013-06-25 MED ORDER — HEPARIN (PORCINE) IN NACL 100-0.45 UNIT/ML-% IJ SOLN
1700.0000 [IU]/h | INTRAMUSCULAR | Status: DC
  Filled 2013-06-25: qty 250

## 2013-06-25 MED ORDER — SODIUM CHLORIDE 0.45 % IV SOLN
50.0000 mL/h | INTRAVENOUS | Status: DC
  Administered 2013-06-26 – 2013-06-28 (×3): 50 mL/h via INTRAVENOUS

## 2013-06-25 MED ORDER — ONDANSETRON HCL 4 MG/2ML IJ SOLN: 4.0000 mg | Freq: Four times a day (QID) | INTRAMUSCULAR | Status: DC | PRN

## 2013-06-25 MED ORDER — HYDROCODONE-ACETAMINOPHEN 5-325 MG PO TABS: 1.0000 | ORAL_TABLET | ORAL | Status: DC | PRN

## 2013-06-25 MED ORDER — CHLORTHALIDONE 25 MG PO TABS
25.0000 mg | ORAL_TABLET | Freq: Every day | ORAL | Status: DC
  Administered 2013-06-26 – 2013-06-29 (×4): 25 mg via ORAL
  Filled 2013-06-25 (×4): qty 1

## 2013-06-25 MED ORDER — ATENOLOL 50 MG PO TABS
50.0000 mg | ORAL_TABLET | Freq: Every day | ORAL | Status: DC
  Administered 2013-06-26 – 2013-06-29 (×4): 50 mg via ORAL
  Filled 2013-06-25 (×4): qty 1

## 2013-06-25 MED ORDER — IOHEXOL 350 MG/ML SOLN
100.0000 mL | Freq: Once | INTRAVENOUS | Status: AC | PRN
  Administered 2013-06-25: 100 mL via INTRAVENOUS

## 2013-06-25 MED ORDER — ACETAMINOPHEN 325 MG PO TABS
650.0000 mg | ORAL_TABLET | Freq: Four times a day (QID) | ORAL | Status: DC | PRN
  Administered 2013-06-27: 650 mg via ORAL
  Filled 2013-06-25: qty 2

## 2013-06-25 MED ORDER — HEPARIN BOLUS VIA INFUSION
4000.0000 [IU] | Freq: Once | INTRAVENOUS | Status: AC
  Administered 2013-06-25: 4000 [IU] via INTRAVENOUS
  Filled 2013-06-25: qty 4000

## 2013-06-25 MED ORDER — ONDANSETRON HCL 4 MG PO TABS: 4.0000 mg | ORAL_TABLET | Freq: Four times a day (QID) | ORAL | Status: DC | PRN

## 2013-06-25 MED ORDER — ATENOLOL-CHLORTHALIDONE 50-25 MG PO TABS: 1.0000 | ORAL_TABLET | Freq: Every day | ORAL | Status: DC

## 2013-06-25 MED ORDER — SODIUM CHLORIDE 0.9 % IJ SOLN
3.0000 mL | Freq: Two times a day (BID) | INTRAMUSCULAR | Status: DC
  Administered 2013-06-25 – 2013-06-27 (×3): 3 mL via INTRAVENOUS

## 2013-06-25 NOTE — Progress Notes (Signed)
ANTICOAGULATION CONSULT NOTE - Initial Consult  Pharmacy Consult for Heparin Indication:Saddle Pulmonary Embolism  Allergies  Allergen Reactions  . Cephalexin Rash  . Levofloxacin Rash    Patient Measurements: Height: 6\' 3"  (190.5 cm) Weight: 229 lb 4.5 oz (104 kg) IBW/kg (Calculated) : 84.5 Heparin Dosing Weight: 84.5 kg  Vital Signs: Temp: 97.4 F (36.3 C) (10/21 1122) Temp src: Oral (10/21 1122) BP: 120/84 mmHg (10/21 1122) Pulse Rate: 79 (10/21 1122)  Labs:  Recent Labs  06/25/13 1223  CREATININE 1.1    Estimated Creatinine Clearance: 85.1 ml/min (by C-G formula based on Cr of 1.1).   Medical History: Past Medical History  Diagnosis Date  . 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 Depression     on multiple meds. Has been seen at [facility]. and Dr. Dub Mikes is his prescriber  . Diverticulitis of colon 2000 's    treated as an outpatient  . GERD (gastroesophageal reflux disease)     UGI done August '12 - ulcer/. Dr. Noe Gens, gastroenterologist [facility]   . Peptic ulcer     in the past and just recently-August '12  . Hypertension   . Hyperlipidemia     last lipid panel: HDL 44, LDL 117  . Hx of adenomatous colonic polyps   . Sleep apnea, primary central   . skin cancer   . Allergy     Medications:  Scheduled:  . heparin  4,000 Units Intravenous Once   Infusions:  . heparin      Assessment:  67 yr old male presented to MD with complaint of SOB.  Recent TURP 2 weeks ago  CTAngio reveals + bilateral PE  Admitted to ICU for IV heparin and close monitoring  Med Rec not completed at this time. Spoke with RN who confirmed patient is not taking any anticoagulation PTA  Goal of Therapy:  Heparin level 0.3-0.7 units/ml Monitor platelets by anticoagulation protocol: Yes   Plan:   Check baseline PTT and PT/INR STAT  Heparin 4000 unit IV bolus x 1 followed by infusion @ 1500 units/hr (18 units/kg/hr)  Check heparin 6 hrs  after heparin started  Check heparin level and CBC daily while on heparin  Twain Stenseth, Joselyn Glassman, PharmD 06/25/2013,4:44 PM

## 2013-06-25 NOTE — H&P (Signed)
John Blackburn is an 67 y.o. male.   Chief Complaint: Shortness of  Breath with chest pain HPI: Mr. John Blackburn presents for evaluation shortness of breath. He had TUR-P 2 weeks ago. Last week he had a feeling like he was kicked in the chest and he had a painful esophagus. He awoke feeling confused. Pharmacist was concerned about Norco or Sienna or oxybutynin causing problems.   Stat lab: D-dimer >20 CT angio chest - saddle PE  Now admitted to step-down for IV heparin and close monitoring  Past Medical History  Diagnosis Date  . 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 Depression     on multiple meds. Has been seen at [facility]. and Dr. Dub Mikes is his prescriber  . Diverticulitis of colon 2000 's    treated as an outpatient  . GERD (gastroesophageal reflux disease)     UGI done August '12 - ulcer/. Dr. Noe Blackburn, gastroenterologist in [facility].   . Peptic ulcer     in the past and just recently-August '12  . Hypertension   . Hyperlipidemia     last lipid panel: HDL 44, LDL 117  . Hx of adenomatous colonic polyps   . Sleep apnea, primary central   . skin cancer   . Allergy     Past Surgical History  Procedure Laterality Date  . Cholecystectomy  1990    laproscopic   . Tkr bilateral  2006    Dr. Erskine Squibb  . Joint replacement      knee  . Prostate surgery      needle biopsy's  . Hernia repair    . Vasectomy      Family History  Problem Relation Age of Onset  . Stroke Mother   . Hypertension Mother   . Heart disease Mother   . Heart disease Father     CAD/MI-fatal sudden death  . Cancer Sister     colon cancer 20090-survivor  . Heart disease Paternal Uncle   . Heart disease Paternal Grandfather    Social History:  reports that he has never smoked. He has never used smokeless tobacco. He reports that he drinks about 0.5 ounces of alcohol per week. He reports that he does not use illicit drugs.  Allergies:  Allergies  Allergen Reactions  .  Cephalexin Rash  . Levofloxacin Rash    No prescriptions prior to admission    Results for orders placed in visit on 06/25/13 (from the past 48 hour(s))  D-DIMER, QUANTITATIVE     Status: Abnormal   Collection Time    06/25/13 12:23 PM      Result Value Range   D-Dimer, Quant >20.00 (*) 0.00 - 0.48 ug/mL-FEU   Comment: At the inhouse established cutoff value of 0.48 ug/mL FEU, this     methology has been documented in the literature to have a sensitivity     and negative predictive value of at least 98-99%.  The test result     should be correlated with an assessment of the clinical probability of     DVT/VTE.  COMPREHENSIVE METABOLIC PANEL     Status: Abnormal   Collection Time    06/25/13 12:23 PM      Result Value Range   Sodium 139  135 - 145 mEq/L   Potassium 4.3  3.5 - 5.1 mEq/L   Chloride 100  96 - 112 mEq/L   CO2 28  19 - 32 mEq/L  Glucose, Bld 157 (*) 70 - 99 mg/dL   BUN 17  6 - 23 mg/dL   Creatinine, Ser 1.1  0.4 - 1.5 mg/dL   Total Bilirubin 1.0  0.3 - 1.2 mg/dL   Alkaline Phosphatase 65  39 - 117 U/L   AST 25  0 - 37 U/L   ALT 24  0 - 53 U/L   Total Protein 7.8  6.0 - 8.3 g/dL   Albumin 3.8  3.5 - 5.2 g/dL   Calcium 9.7  8.4 - 82.9 mg/dL   GFR 56.21  >30.86 mL/min   Ct Angio Chest W/cm &/or Wo Cm  06/25/2013   CLINICAL DATA:  Evaluate for pulmonary embolus  EXAM: CT ANGIOGRAPHY CHEST WITH CONTRAST  TECHNIQUE: Multidetector CT imaging of the chest was performed using the standard protocol during bolus administration of intravenous contrast. Multiplanar CT image reconstructions including MIPs were obtained to evaluate the vascular anatomy.  CONTRAST:  OMNIPAQUE IOHEXOL 350 MG/ML SOLN  COMPARISON:  03/29/2013  FINDINGS: Thereis no pleural effusion identified. No airspace consolidation identified. Calcified granuloma is identified within the right upper lobe, image 58/ series 7. The trachea is patent and is midline. There is mild cardiac enlargement. There are  large bilateral occlusive thrombi involving the right and left main pulmonary arteries. A saddle embolus is also present. There is a upper limits of normal right paratracheal lymph node which measures 1 cm. Sub- carinal lymph node is mildly enlarged limits of normal measuring 1.1 cm. There is no axillary or supraclavicular adenopathy.  Limited imaging through the upper abdomen shows mild low attenuation within the liver. No worrisome abnormalities identified.  Review of the visualized osseous structures is significant for multi level lumbar degenerative disc disease. No aggressive lytic or sclerotic bone lesion identified.  Review of the MIP images confirms the above findings.  IMPRESSION: 1. Examination is positive for acute, bilateral pulmonary emboli.  2.  Prior granulomatous disease.  Critical Value/emergent results were called by telephone at the time of interpretation on 06/25/2013 at 3:57 PM to Dr.Bayani Renteria , who verbally acknowledged these results.   Electronically Signed   By: Signa Kell M.D.   On: 06/25/2013 16:06    Review of Systems  Constitutional: Negative for fever, chills and weight loss.  HENT: Negative.   Eyes: Negative.   Respiratory: Positive for shortness of breath. Negative for cough, hemoptysis, sputum production and wheezing.   Cardiovascular: Positive for chest pain and leg swelling. Negative for palpitations and orthopnea.  Gastrointestinal: Negative.   Genitourinary: Negative.   Musculoskeletal: Negative.   Skin: Negative.   Neurological: Positive for weakness.  Endo/Heme/Allergies: Negative.   Psychiatric/Behavioral: Negative.     There were no vitals taken for this visit. Physical Exam   Assessment/Plan  06/25/13 1122  BP: 120/84  Pulse: 79  Temp: 97.4 F (36.3 C)   gen'l- WNWD white man in no acute distress HEENT- normal Cor - 2+ radial pulse, RRR, no JVD sitting Pulm - no increased WOB, O2 sat 95%, Lungs - good breath sounds, no rales or  wheezes Ext- left calve appears > right calve:  41 cm right  43 cm left.  A/p 1. Acute PE - saddle embolus. Good o2 sats, hemodynamically stable  Plan Step-down admit for close monitoring  IV heparin - pharmacy consulted.   Casimiro Needle Avon Molock 06/25/2013, 4:14 PM

## 2013-06-25 NOTE — Progress Notes (Signed)
Subjective:    Patient ID: John Blackburn, male    DOB: 28-Oct-1945, 67 y.o.   MRN: [id number]  HPI John Blackburn presents for evaluation shortness of breath. He had TUR-P 2 weeks ago. Last week he had a feeling like he was kicked in the chest and he had a painful esophagus. He awoke feeling confused. Pharmacist was concerned about Norco or Sienna or oxybutynin causing problems.   Past Medical History  Diagnosis Date  . 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 Depression     on multiple meds. Has been seen at [facility]. and Dr. Dub Mikes is his prescriber  . Diverticulitis of colon 2000 's    treated as an outpatient  . GERD (gastroesophageal reflux disease)     UGI done August '12 - ulcer/. Dr. Noe Gens, gastroenterologist in [facility].   . Peptic ulcer     in the past and just recently-August '12  . Hypertension   . Hyperlipidemia     last lipid panel: HDL 44, LDL 117  . Hx of adenomatous colonic polyps   . Sleep apnea, primary central   . skin cancer   . Allergy    Past Surgical History  Procedure Laterality Date  . Cholecystectomy  1990    laproscopic   . Tkr bilateral  2006    Dr. Erskine Squibb  . Joint replacement      knee  . Prostate surgery      needle biopsy's  . Hernia repair    . Vasectomy     Family History  Problem Relation Age of Onset  . Stroke Mother   . Hypertension Mother   . Heart disease Mother   . Heart disease Father     CAD/MI-fatal sudden death  . Cancer Sister     colon cancer 20090-survivor  . Heart disease Paternal Uncle   . Heart disease Paternal Grandfather    History   Social History  . Marital Status: Divorced    Spouse Name: N/A    Number of Children: 3  . Years of Education: 16   Occupational History  . contractor    Social History Main Topics  . Smoking status: Never Smoker   . Smokeless tobacco: Never Used  . Alcohol Use: 0.5 oz/week    1 drink(s) per week     Comment: social  . Drug Use: No  .  Sexual Activity: Not Currently    Partners: Female   Other Topics Concern  . 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 3 sons - '74, '76, '87.  1 grandchild.   Work - Chief Executive Officer. Lives alone. No pets. Serially monogamous.   ACP - he has a living will: DNR, DNI.    Current Outpatient Prescriptions on File Prior to Visit  Medication Sig Dispense Refill  . atenolol-chlorthalidone (TENORETIC) 50-25 MG per tablet TAKE ONE TABLET BY MOUTH EVERY DAY  30 tablet  5   No current facility-administered medications on file prior to visit.      Review of Systems System review is negative for any constitutional, cardiac, pulmonary, GI or neuro symptoms or complaints other than as described in the HPI.     Objective:   Physical Exam Filed Vitals:   06/25/13 1122  BP: 120/84  Pulse: 79  Temp: 97.4 F (36.3 C)   gen'l- WNWD white man in no  acute distress HEENT- normal Cor - 2+ radial pulse, RRR, no JVD sitting Pulm - no increased WOB, O2 sat 95%, Lungs - good breath sounds, no rales or wheezes Ext- left calve appears > right calve:  41 cm right  43 cm left.   Lab: D-dimer >20    Assessment & Plan:  Shortness of breath - development of sudden chest pain follow by shortness of breath one week after surgery, an enlarged calve and positive D-Dimer suggests PE.  Plan To hospital for immediate CT angio chest  If positive for D-dimer will admit.

## 2013-06-26 DIAGNOSIS — I2699 Other pulmonary embolism without acute cor pulmonale: Secondary | ICD-10-CM

## 2013-06-26 LAB — CBC
MCV: 96.3 fL (ref 78.0–100.0)
Platelets: 160 10*3/uL (ref 150–400)
RBC: 3.77 MIL/uL — ABNORMAL LOW (ref 4.22–5.81)
RDW: 13.4 % (ref 11.5–15.5)
WBC: 9.2 10*3/uL (ref 4.0–10.5)

## 2013-06-26 LAB — HEPARIN LEVEL (UNFRACTIONATED)
Heparin Unfractionated: 0.25 IU/mL — ABNORMAL LOW (ref 0.30–0.70)
Heparin Unfractionated: 0.3 IU/mL (ref 0.30–0.70)

## 2013-06-26 MED ORDER — TRAMADOL HCL 50 MG PO TABS
50.0000 mg | ORAL_TABLET | Freq: Four times a day (QID) | ORAL | Status: DC | PRN
  Administered 2013-06-26 (×2): 50 mg via ORAL
  Filled 2013-06-26 (×2): qty 1

## 2013-06-26 MED ORDER — HEPARIN (PORCINE) IN NACL 100-0.45 UNIT/ML-% IJ SOLN
1850.0000 [IU]/h | INTRAMUSCULAR | Status: DC
  Administered 2013-06-26: 1850 [IU]/h via INTRAVENOUS
  Filled 2013-06-26 (×2): qty 250

## 2013-06-26 MED ORDER — HEPARIN (PORCINE) IN NACL 100-0.45 UNIT/ML-% IJ SOLN
2000.0000 [IU]/h | INTRAMUSCULAR | Status: DC
  Administered 2013-06-27: 2000 [IU]/h via INTRAVENOUS
  Filled 2013-06-26 (×2): qty 250

## 2013-06-26 MED ORDER — HEPARIN (PORCINE) IN NACL 100-0.45 UNIT/ML-% IJ SOLN
1650.0000 [IU]/h | INTRAMUSCULAR | Status: DC
  Administered 2013-06-26: 1650 [IU]/h via INTRAVENOUS
  Filled 2013-06-26 (×3): qty 250

## 2013-06-26 MED ORDER — HEPARIN BOLUS VIA INFUSION
1500.0000 [IU] | Freq: Once | INTRAVENOUS | Status: AC
  Administered 2013-06-26: 1500 [IU] via INTRAVENOUS
  Filled 2013-06-26: qty 1500

## 2013-06-26 MED ORDER — ENSURE COMPLETE PO LIQD: 237.0000 mL | Freq: Every day | ORAL | Status: DC | PRN

## 2013-06-26 NOTE — Progress Notes (Signed)
ANTICOAGULATION CONSULT NOTE - Follow Up Consult  Pharmacy Consult for IV heparin Indication: acute bilateral PE and bilateral DVT  Allergies  Allergen Reactions  . Cephalexin Rash  . Levofloxacin Rash    Patient Measurements: Height: 6\' 3"  (190.5 cm) Weight: 229 lb 4.5 oz (104 kg) IBW/kg (Calculated) : 84.5 Heparin Dosing Weight: 104  Vital Signs: Temp: 98.7 F (37.1 C) (10/22 0800) Temp src: Oral (10/22 0800) BP: 107/55 mmHg (10/22 0800) Pulse Rate: 69 (10/22 0800)  Labs:  Recent Labs  06/25/13 1223 06/25/13 1646 06/25/13 2300 06/26/13 0348 06/26/13 0759  HGB  --   --   --  12.3*  --   HCT  --   --   --  36.3*  --   PLT  --   --   --  160  --   APTT  --  36  --   --   --   LABPROT  --  14.5  --   --   --   INR  --  1.15  --   --   --   HEPARINUNFRC  --   --  0.21*  --  0.25*  CREATININE 1.1  --   --   --   --     Estimated Creatinine Clearance: 85.1 ml/min (by C-G formula based on Cr of 1.1).   Assessment: 30 yom s/p TURP 2 weeks ago presented 10/21 with SOB. CTa revealed acute, bilateral PE. IV heparin started. On 10/22, LLE doppler confirmed bilateral DVT.   IV heparin currently running at 1650 units/hr and heparin level low at 0.25. CBC okay, no bleeding. MD plans 36-48 hours of IV heparin then change to novel oral anticoagulant.   Goal of Therapy:  Heparin level 0.3-0.7 units/ml Monitor platelets by anticoagulation protocol: Yes   Plan:   Bolus IV heparin 1500 units x 1 then increase IV heparin rate to 1850 units/hr  Heparin level at 1700   Daily heparin level and CBC  Geoffry Paradise, PharmD, BCPS Pager: [phone] 10:26 AM Pharmacy #: [phone]

## 2013-06-26 NOTE — Progress Notes (Signed)
INITIAL NUTRITION ASSESSMENT  DOCUMENTATION CODES Per approved criteria  -Non-severe (moderate) malnutrition in the context of acute illness or injury  Pt meets criteria for Moderate MALNUTRITION in the context of Acute illness as evidenced by 5% wt loss in 2 weeks and estimated energy intake <75% of estimated energy needs for >7 days.   INTERVENTION: Provide Ensure Complete once daily prn Provide a snack once daily Encourage PO intake > 75% of meals  NUTRITION DIAGNOSIS: Inadequate oral intake related to decreased appetite as evidenced by 5% wt loss in 2 weeks per pt report.   Goal: Pt to meet >/= 90% of their estimated nutrition needs   Monitor:  PO intake Weight Labs  Reason for Assessment: Malnutrition screening tool, score of 5  67 y.o. male  Admitting Dx: <principal problem not specified>  ASSESSMENT: 67 year old male with history of GERD, peptic ulcer, hypertension, hyperlipidemia, and diverticulitis presents with complaints of shortness of breath and chest pain. Pt admitted for bilateral artery embolism with saddle embolus.   Pt states he has had a decreased appetite for the past 2 weeks since having a TURP procedure at [facility]. He states he has been eating 3 meals daily but, eating about 50-75% compared to usual. He states he usually weighs 240 lbs.   Height: Ht Readings from Last 1 Encounters:  06/25/13 6\' 3"  (1.905 m)    Weight: Wt Readings from Last 1 Encounters:  06/25/13 229 lb 4.5 oz (104 kg)    Ideal Body Weight: 196 lbs  % Ideal Body Weight: 117%  Wt Readings from Last 10 Encounters:  06/25/13 229 lb 4.5 oz (104 kg)  06/25/13 233 lb 12.8 oz (106.051 kg)  05/14/13 241 lb (109.317 kg)  03/26/13 244 lb (110.678 kg)  09/05/12 239 lb (108.41 kg)  05/15/12 229 lb (103.874 kg)  12/12/11 240 lb 3.2 oz (108.954 kg)  12/06/11 143 lb 9.6 oz (65.137 kg)  11/13/11 249 lb 12.8 oz (113.309 kg)  09/08/11 247 lb 8 oz (112.265 kg)    Usual Body  Weight: 240 lbs  % Usual Body Weight: 95%  BMI:  Body mass index is 28.66 kg/(m^2).  Estimated Nutritional Needs: Kcal: 2300-2500 Protein: 110-125 grams Fluid: 2.7-2.8 L/day  Skin: WDL  Diet Order: General  EDUCATION NEEDS: -No education needs identified at this time   Intake/Output Summary (Last 24 hours) at 06/26/13 1248 Last data filed at 06/26/13 1200  Gross per 24 hour  Intake   2517 ml  Output   1125 ml  Net   1392 ml    Last BM: PTA  Labs:   Recent Labs Lab 06/25/13 1223  NA 139  K 4.3  CL 100  CO2 28  BUN 17  CREATININE 1.1  CALCIUM 9.7  GLUCOSE 157*    CBG (last 3)  No results found for this basename: GLUCAP,  in the last 72 hours  Scheduled Meds: . atenolol  50 mg Oral Daily   And  . chlorthalidone  25 mg Oral Daily  . sodium chloride  3 mL Intravenous Q12H    Continuous Infusions: . sodium chloride 50 mL/hr (06/25/13 1748)  . heparin 1,850 Units/hr (06/26/13 1100)    Past Medical History  Diagnosis Date  . 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 Depression     on multiple meds. Has been seen at [facility]. and Dr. Dub Mikes is his prescriber  . Diverticulitis of colon 2000 's  treated as an outpatient  . GERD (gastroesophageal reflux disease)     UGI done August '12 - ulcer/. Dr. Noe Gens, gastroenterologist in [facility].   . Peptic ulcer     in the past and just recently-August '12  . Hypertension   . Hyperlipidemia     last lipid panel: HDL 44, LDL 117  . Hx of adenomatous colonic polyps   . Sleep apnea, primary central   . skin cancer   . Allergy     Past Surgical History  Procedure Laterality Date  . Cholecystectomy  1990    laproscopic   . Tkr bilateral  2006    Dr. Erskine Squibb  . Joint replacement      knee  . Prostate surgery      needle biopsy's  . Hernia repair    . Vasectomy      Ian Malkin RD, LDN Inpatient Clinical Dietitian Pager: [phone] After Hours Pager: [phone]

## 2013-06-26 NOTE — Progress Notes (Signed)
*  PRELIMINARY RESULTS* Vascular Ultrasound Lower extremity venous duplex has been completed.  Preliminary findings: Right= DVT involving popliteal and posterior tibial veins. Left = DVT involving popliteal vein.    EUNICE, Arohi Salvatierra 06/26/2013, 10:15 AM

## 2013-06-26 NOTE — Progress Notes (Signed)
PHARMACY BRIEF NOTE - Drug Level Result  Consult for:  IV Heparin Indication:  Acute bilateral PE and bilateral DVT  With the infusion of 1850 units/hr, the Heparin level drawn at 17:15 was reported as 0.3 units/ml.  This level is in the low end of the therapeutic range, 0.3-0.7 units/ml.  The patient's nurse reports some blood in urine related to recent TURP surgery.  Plan:  Increase the infusion rate to 2000 units/hr.  Repeat the Heparin level 6 hours after the rate is changed.  Continue daily Heparin levels and CBC.  Polo Riley R.Ph. 06/26/2013 6:08 PM

## 2013-06-26 NOTE — Progress Notes (Signed)
Pt been on CPAP since about 2000, pt takes off and puts on as needed.  Pt tolerating well at this time, RT to monitor and assess as needed

## 2013-06-26 NOTE — Progress Notes (Signed)
Patient placed on CPAP by RN. Initial settings were done by RT. Patient on 5 cmH2O per home regiman. Water added to chamber for humidification. RT will continue to monitor.

## 2013-06-26 NOTE — Progress Notes (Signed)
   CARE MANAGEMENT NOTE 06/26/2013  Patient:  John Blackburn, John Blackburn   Account Number:  [id number]  Date Initiated:  06/26/2013  Documentation initiated by:  DAVIS,RHONDA  Subjective/Objective Assessment:   pt presented with dyspnea, bilateral p.e. confirmed     Action/Plan:   home when stable   Anticipated DC Date:  06/29/2013   Anticipated DC Plan:  HOME/SELF CARE  In-house referral  NA      DC Planning Services  NA      [facility] Choice  NA   Choice offered to / List presented to:  NA   DME arranged  NA      DME agency  NA     HH arranged  NA      HH agency  NA   Status of service:  In process, will continue to follow Medicare Important Message given?  NA - LOS <3 / Initial given by admissions (If response is "NO", the following Medicare IM given date fields will be blank) Date Medicare IM given:   Date Additional Medicare IM given:    Discharge Disposition:    Per UR Regulation:  Reviewed for med. necessity/level of care/duration of stay  If discussed at Long Length of Stay Meetings, dates discussed:    Comments:  Bjorn Loser Davis,RN,BSN,CCM

## 2013-06-26 NOTE — Progress Notes (Signed)
Subjective: John Blackburn admitted for bilateral pulmonary artery embolism with saddle embolus.  He has no complaints  Objective: Lab:  Recent Labs  06/26/13 0348  WBC 9.2  HGB 12.3*  HCT 36.3*  MCV 96.3  PLT 160    Recent Labs  06/25/13 1223  NA 139  K 4.3  CL 100  GLUCOSE 157*  BUN 17  CREATININE 1.1  CALCIUM 9.7    Imaging: see admit note  Scheduled Meds: . atenolol  50 mg Oral Daily   And  . chlorthalidone  25 mg Oral Daily  . sodium chloride  3 mL Intravenous Q12H   Continuous Infusions: . sodium chloride 50 mL/hr (06/25/13 1748)  . heparin 1,650 Units/hr (06/26/13 0351)   PRN Meds:.acetaminophen, acetaminophen, HYDROcodone-acetaminophen, ondansetron (ZOFRAN) IV, ondansetron, zolpidem   Physical Exam: Filed Vitals:   06/26/13 0351  BP: 121/71  Pulse: 60  Temp:   Resp: 29  gen'l- WNWD man in no distress HEENT- normal Cor- 2+ radial, RRR Pulm - no increased WOB, no rales Neuro - awake and alert      Assessment/Plan: 1. PE - hemodynamically stable  Plan 36-48 hrs IV heparin then change to NOAC  LE venous doppler  Bedrest today.    Illene Regulus [facility] (o) [phone]; (c) [phone] Call-grp - [facility]  Tele: [phone]  06/26/2013, 8:15 AM

## 2013-06-26 NOTE — Progress Notes (Signed)
ANTICOAGULATION CONSULT NOTE - Follow Up Consult  Pharmacy Consult for Heparin Indication: pulmonary embolus  Allergies  Allergen Reactions  . Cephalexin Rash  . Levofloxacin Rash    Patient Measurements: Height: 6\' 3"  (190.5 cm) Weight: 229 lb 4.5 oz (104 kg) IBW/kg (Calculated) : 84.5 Heparin Dosing Weight:   Vital Signs: Temp: 98.3 F (36.8 C) (10/22 0000) Temp src: Oral (10/22 0000) BP: 113/56 mmHg (10/21 1929) Pulse Rate: 63 (10/21 1929)  Labs:  Recent Labs  06/25/13 1223 06/25/13 1646 06/25/13 2300  APTT  --  36  --   LABPROT  --  14.5  --   INR  --  1.15  --   HEPARINUNFRC  --   --  0.21*  CREATININE 1.1  --   --     Estimated Creatinine Clearance: 85.1 ml/min (by C-G formula based on Cr of 1.1).   Medications:  Infusions:  . sodium chloride 50 mL/hr (06/25/13 1748)  . heparin      Assessment: Patient with low heparin level.  No issues per RN.  Goal of Therapy:  Heparin level 0.3-0.7 units/ml Monitor platelets by anticoagulation protocol: Yes   Plan:  Increase heparin to 1650 units/hr, recheck at 0800.  John Blackburn, John Blackburn 06/26/2013,12:34 AM

## 2013-06-27 DIAGNOSIS — E440 Moderate protein-calorie malnutrition: Secondary | ICD-10-CM

## 2013-06-27 DIAGNOSIS — I10 Essential (primary) hypertension: Secondary | ICD-10-CM

## 2013-06-27 LAB — CBC
MCHC: 34.4 g/dL (ref 30.0–36.0)
MCV: 96.5 fL (ref 78.0–100.0)
RDW: 13.3 % (ref 11.5–15.5)

## 2013-06-27 LAB — HEPARIN LEVEL (UNFRACTIONATED): Heparin Unfractionated: 0.32 IU/mL (ref 0.30–0.70)

## 2013-06-27 MED ORDER — ENOXAPARIN SODIUM 100 MG/ML ~~LOC~~ SOLN
100.0000 mg | Freq: Two times a day (BID) | SUBCUTANEOUS | Status: DC
  Administered 2013-06-27 – 2013-06-29 (×5): 100 mg via SUBCUTANEOUS
  Filled 2013-06-27 (×7): qty 1

## 2013-06-27 MED ORDER — ENOXAPARIN SODIUM 100 MG/ML ~~LOC~~ SOLN: 1.0000 mg/kg | Freq: Two times a day (BID) | SUBCUTANEOUS | Status: DC

## 2013-06-27 MED ORDER — WARFARIN SODIUM 10 MG PO TABS
10.0000 mg | ORAL_TABLET | Freq: Every day | ORAL | Status: AC
  Administered 2013-06-27 – 2013-06-29 (×3): 10 mg via ORAL
  Filled 2013-06-27 (×4): qty 1

## 2013-06-27 MED ORDER — WARFARIN - PHYSICIAN DOSING INPATIENT
Freq: Every day | Status: DC
  Administered 2013-06-27 – 2013-06-28 (×2)

## 2013-06-27 MED ORDER — WARFARIN VIDEO
Freq: Once | Status: AC
  Administered 2013-06-27: 18:00:00

## 2013-06-27 MED ORDER — COUMADIN BOOK
Freq: Once | Status: AC
  Administered 2013-06-27: 18:00:00
  Filled 2013-06-27: qty 1

## 2013-06-27 NOTE — Progress Notes (Signed)
Subjective: Feeling well. Minimal SOB. Does report hematuria but no pain  Objective: Lab:  Recent Labs  06/26/13 0348 06/27/13 0325  WBC 9.2 9.0  HGB 12.3* 12.2*  HCT 36.3* 35.5*  MCV 96.3 96.5  PLT 160 179    Recent Labs  06/25/13 1223  NA 139  K 4.3  CL 100  GLUCOSE 157*  BUN 17  CREATININE 1.1  CALCIUM 9.7    Imaging: LE venous doppler 06/26/13: Summary: Findings consistent with deep vein thrombosis involving the right popliteal vein, right posterial tibial vein, and left popliteal vein.  Scheduled Meds: . atenolol  50 mg Oral Daily   And  . chlorthalidone  25 mg Oral Daily  . sodium chloride  3 mL Intravenous Q12H   Continuous Infusions: . sodium chloride 50 mL/hr (06/26/13 1344)  . heparin 2,000 Units/hr (06/27/13 0556)   PRN Meds:.acetaminophen, acetaminophen, feeding supplement (ENSURE COMPLETE), HYDROcodone-acetaminophen, ondansetron (ZOFRAN) IV, ondansetron, traMADol, zolpidem   Physical Exam: Filed Vitals:   06/27/13 0324  BP: 112/60  Pulse: 61  Temp:   Resp: 26   gen'l- WNWD man Cor- RRR Pulm - normal respirations w/o rales Neuro - nromal     Assessment/Plan: 1. PE - hemodynamically stable Plan   Transfer to med -surg bed  D/c heparin  Start lovenox 90 mg bid SQ  Start warfarin 10 mg daily x 3 doses  2. GU - s/p TURP now with hematuria, most likely due to blood thinner   Illene Regulus  IM (o) [phone]; (c) [phone] Call-grp - [facility]  Tele: [phone]  06/27/2013, 8:11 AM

## 2013-06-27 NOTE — Progress Notes (Signed)
PHARMACY BRIEF NOTE - Drug Level Result  Consult for:  IV Heparin Indication:  Acute bilateral PE and bilateral DVT  With the infusion of 1850 units/hr, the Heparin level drawn at 2340 was reported as 0.32 units/ml.  This level is in the low end of the therapeutic range, 0.3-0.7 units/ml.  The patient's nurse reports some blood in urine related to recent TURP surgery.  Plan:  Continue the infusion rate to 2000 units/hr.  Repeat the HL 0800 today  Continue daily Heparin levels and CBC.  Lorenza Evangelist 06/27/2013 2:11 AM

## 2013-06-27 NOTE — Progress Notes (Signed)
Pt admin lovenox alone.  Pt tolerated well

## 2013-06-28 LAB — PROTIME-INR: Prothrombin Time: 13.8 seconds (ref 11.6–15.2)

## 2013-06-28 MED ORDER — WARFARIN SODIUM 5 MG PO TABS: 10.0000 mg | ORAL_TABLET | Freq: Every day | ORAL | Status: DC

## 2013-06-28 MED ORDER — ENOXAPARIN SODIUM 100 MG/ML ~~LOC~~ SOLN: 100.0000 mg | Freq: Two times a day (BID) | SUBCUTANEOUS | Status: DC

## 2013-06-28 NOTE — Progress Notes (Signed)
I called pts prescription coverage [employer] [phone] to check on coverage for Lovenox. The rep informed me that the generic Enoxaparin is preferred and copayment would be $52.97 for 8 syringes. Pt made aware of this and stated he could afford it.  Algernon Huxley, RN BSN  [phone]
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
## 2013-06-28 NOTE — Progress Notes (Signed)
Patient requesting assistance with CPAP. Upon arrival to room, noted that the straps of the mask were broken away from the swivel clip. New medium full face mask applied. Education provided to the patient to ensure proper mask removal if needed. He is again on CPAP and verbalizes both his understanding and comfort. He is aware that RT is available in the night if further assistance is needed.

## 2013-06-28 NOTE — Progress Notes (Signed)
Subjective: Feeling ok. No shortness of breath. No chest pain.  Objective: Lab:  Recent Labs  06/26/13 0348 06/27/13 0325  WBC 9.2 9.0  HGB 12.3* 12.2*  HCT 36.3* 35.5*  MCV 96.3 96.5  PLT 160 179    Recent Labs  06/25/13 1223  NA 139  K 4.3  CL 100  GLUCOSE 157*  BUN 17  CREATININE 1.1  CALCIUM 9.7    Imaging:  Scheduled Meds: . atenolol  50 mg Oral Daily   And  . chlorthalidone  25 mg Oral Daily  . enoxaparin (LOVENOX) injection  100 mg Subcutaneous Q12H  . sodium chloride  3 mL Intravenous Q12H  . warfarin  10 mg Oral q1800  . Warfarin - Physician Dosing Inpatient   Does not apply q1800   Continuous Infusions: . sodium chloride 50 mL/hr (06/28/13 0523)   PRN Meds:.acetaminophen, acetaminophen, feeding supplement (ENSURE COMPLETE), HYDROcodone-acetaminophen, ondansetron (ZOFRAN) IV, ondansetron, traMADol, zolpidem   Physical Exam: Filed Vitals:   06/28/13 0527  BP: 121/72  Pulse: 71  Temp: 98.4 F (36.9 C)  Resp: 20   gen'l - WNWD man in no distress Cor- RRR Pul - normal      Assessment/Plan: 1. PE - for d/c home 10/25 on lovenox while transitioning to Warfarin  Dictate #[id number]   Illene Regulus [facility] (o) [phone]; (c) [phone] Call-grp - [facility]  Tele: [phone]  06/28/2013, 8:10 AM

## 2013-06-29 LAB — PROTIME-INR: Prothrombin Time: 13.8 seconds (ref 11.6–15.2)

## 2013-06-29 MED ORDER — WARFARIN SODIUM 5 MG PO TABS: 10.0000 mg | ORAL_TABLET | Freq: Every day | ORAL | Status: DC

## 2013-06-29 MED ORDER — ENOXAPARIN SODIUM 100 MG/ML ~~LOC~~ SOLN: 100.0000 mg | Freq: Two times a day (BID) | SUBCUTANEOUS | Status: DC

## 2013-06-29 MED ORDER — WARFARIN SODIUM 5 MG PO TABS
5.0000 mg | ORAL_TABLET | Freq: Once | ORAL | Status: DC
  Filled 2013-06-29: qty 1

## 2013-06-29 NOTE — Progress Notes (Signed)
Patient discharged home in stable condition.  No change from morning assessment.  Instructions and scripts provided to patient and all questions answered.  Coumadin 10mg  given prior to discharge per MD.

## 2013-06-29 NOTE — Progress Notes (Signed)
Subjective: Pt without c/o No SOB No CP  Objective: Vital signs in last 24 hours: Temp:  [98.2 F (36.8 C)-99.1 F (37.3 C)] 99.1 F (37.3 C) (10/25 0548) Pulse Rate:  [56-75] 56 (10/25 0548) Resp:  [18-20] 18 (10/25 0548) BP: (117-126)/(62-73) 126/71 mmHg (10/25 0548) SpO2:  [93 %-97 %] 93 % (10/25 0548) Weight change:  Last BM Date: 06/28/13  Intake/Output from previous day: 10/24 0701 - 10/25 0700 In: -  Out: 2495 [Urine:2495] Intake/Output this shift:    General appearance: alert Resp: clear to auscultation bilaterally Cardio: regular rate and rhythm  Lab Results:  Recent Labs  06/27/13 0325  WBC 9.0  HGB 12.2*  HCT 35.5*  PLT 179   BMET No results found for this basename: NA, K, CL, CO2, GLUCOSE, BUN, CREATININE, CALCIUM,  in the last 72 hours  Studies/Results: No results found.  Medications: I have reviewed the patient's current medications.  Assessment/Plan: PE/ continue Coumadin/ lovanox injection- pt understand- taking injection at home Coumadin  Recheck PT/INR on Tuesday with HHN BP ok Ok for d/c F/u with  PCP HHN   LOS: 4 days   John Blackburn 06/29/2013, 7:35 AM

## 2013-07-02 ENCOUNTER — Ambulatory Visit (INDEPENDENT_AMBULATORY_CARE_PROVIDER_SITE_OTHER): Payer: Medicare Other | Admitting: General Practice

## 2013-07-02 ENCOUNTER — Other Ambulatory Visit: Payer: Self-pay | Admitting: General Practice

## 2013-07-02 DIAGNOSIS — I2692 Saddle embolus of pulmonary artery without acute cor pulmonale: Secondary | ICD-10-CM

## 2013-07-02 DIAGNOSIS — Z5181 Encounter for therapeutic drug level monitoring: Secondary | ICD-10-CM

## 2013-07-02 DIAGNOSIS — Z7901 Long term (current) use of anticoagulants: Secondary | ICD-10-CM | POA: Insufficient documentation

## 2013-07-02 LAB — POCT INR: INR: 1.4

## 2013-07-02 MED ORDER — ENOXAPARIN SODIUM 100 MG/ML ~~LOC~~ SOLN: 100.0000 mg | Freq: Two times a day (BID) | SUBCUTANEOUS | Status: DC

## 2013-07-05 ENCOUNTER — Other Ambulatory Visit: Payer: Self-pay | Admitting: General Practice

## 2013-07-05 ENCOUNTER — Ambulatory Visit (INDEPENDENT_AMBULATORY_CARE_PROVIDER_SITE_OTHER): Payer: Medicare Other | Admitting: General Practice

## 2013-07-05 DIAGNOSIS — Z7901 Long term (current) use of anticoagulants: Secondary | ICD-10-CM

## 2013-07-05 DIAGNOSIS — I2692 Saddle embolus of pulmonary artery without acute cor pulmonale: Secondary | ICD-10-CM

## 2013-07-05 MED ORDER — WARFARIN SODIUM 5 MG PO TABS: ORAL_TABLET | ORAL | Status: DC

## 2013-07-08 ENCOUNTER — Ambulatory Visit (INDEPENDENT_AMBULATORY_CARE_PROVIDER_SITE_OTHER): Payer: Medicare Other | Admitting: Internal Medicine

## 2013-07-08 ENCOUNTER — Other Ambulatory Visit (INDEPENDENT_AMBULATORY_CARE_PROVIDER_SITE_OTHER): Payer: Medicare Other

## 2013-07-08 ENCOUNTER — Encounter: Payer: Self-pay | Admitting: Internal Medicine

## 2013-07-08 ENCOUNTER — Ambulatory Visit (INDEPENDENT_AMBULATORY_CARE_PROVIDER_SITE_OTHER)
Admission: RE | Admit: 2013-07-08 | Discharge: 2013-07-08 | Disposition: A | Discharge status: Home / Self Care | Payer: Medicare Other | Source: Ambulatory Visit | Attending: Internal Medicine | Admitting: Internal Medicine

## 2013-07-08 VITALS — BP 144/80 | HR 48 | Temp 98.1°F | Wt 237.8 lb

## 2013-07-08 DIAGNOSIS — Z86711 Personal history of pulmonary embolism: Secondary | ICD-10-CM | POA: Insufficient documentation

## 2013-07-08 DIAGNOSIS — R531 Weakness: Secondary | ICD-10-CM

## 2013-07-08 DIAGNOSIS — I2699 Other pulmonary embolism without acute cor pulmonale: Secondary | ICD-10-CM

## 2013-07-08 DIAGNOSIS — R209 Unspecified disturbances of skin sensation: Secondary | ICD-10-CM

## 2013-07-08 DIAGNOSIS — R5381 Other malaise: Secondary | ICD-10-CM

## 2013-07-08 DIAGNOSIS — R202 Paresthesia of skin: Secondary | ICD-10-CM

## 2013-07-08 LAB — HEMOGLOBIN AND HEMATOCRIT, BLOOD
HCT: 41.8 % (ref 39.0–52.0)
Hemoglobin: 14.2 g/dL (ref 13.0–17.0)

## 2013-07-08 LAB — VITAMIN B12: Vitamin B-12: 423 pg/mL (ref 211–911)

## 2013-07-08 IMAGING — CR DG CERVICAL SPINE COMPLETE 4+V
5 series · 5 of 5 positions shown · non-contrast
Comparison: None

CLINICAL DATA: Numbness/paresthesias in both hands for 2 months, no
known injury

EXAM:
CERVICAL SPINE  4+ VIEWS

[view not recorded (1 of 5)]
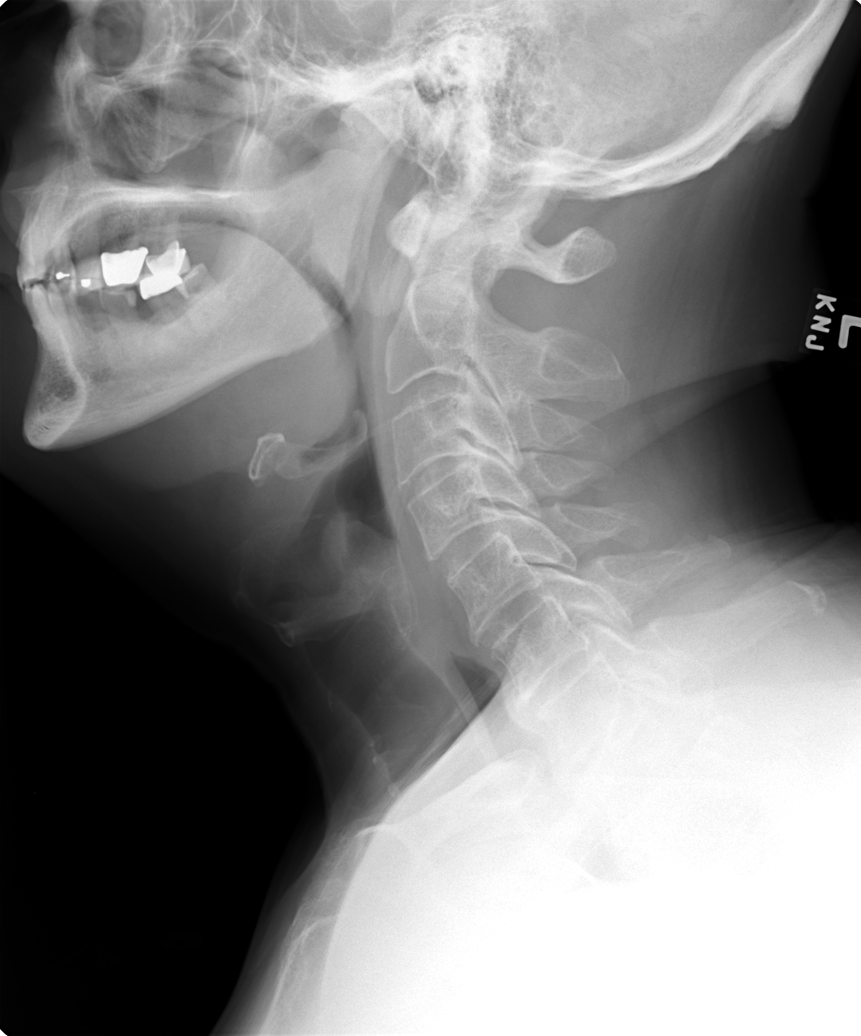

[view not recorded (2 of 5)]
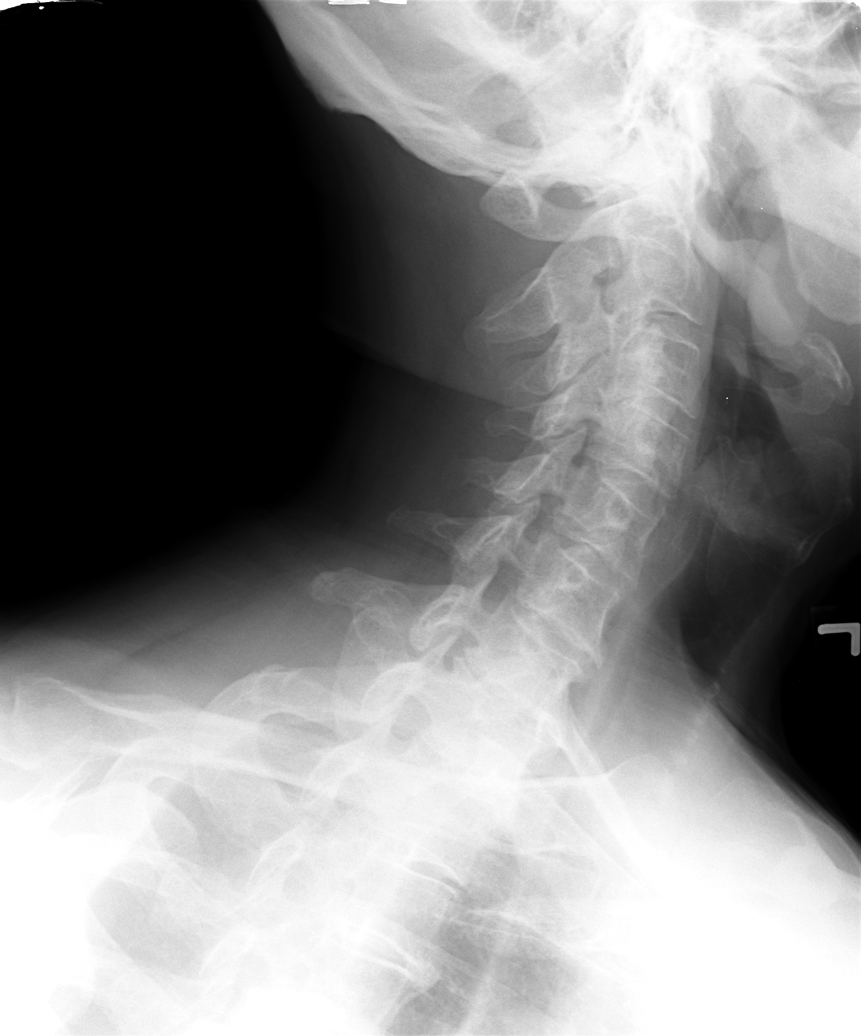

[view not recorded (3 of 5)]
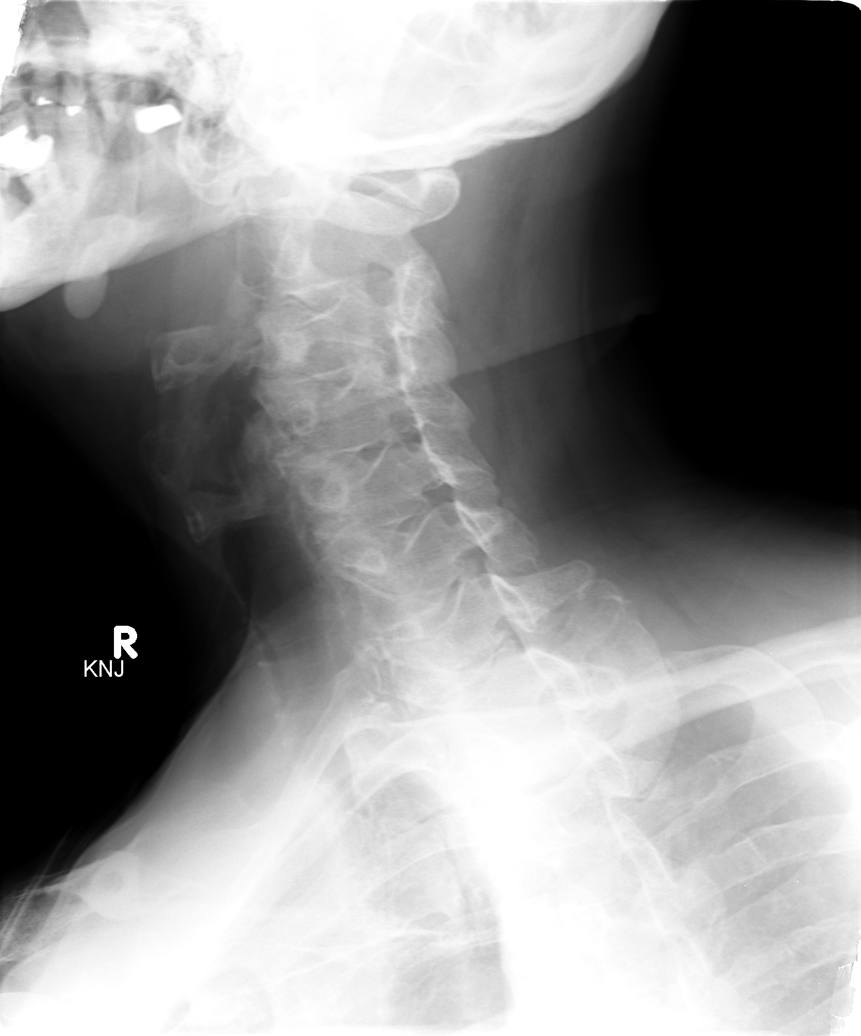

[view not recorded (4 of 5)]
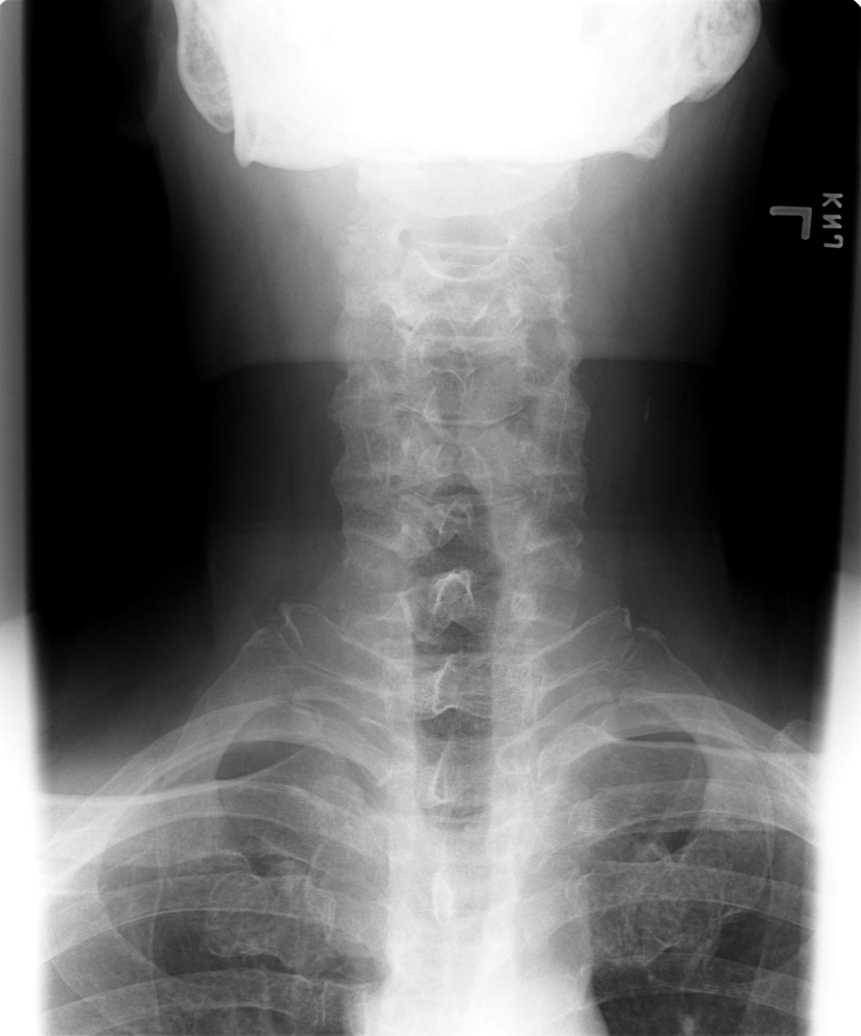

[view not recorded (5 of 5)]
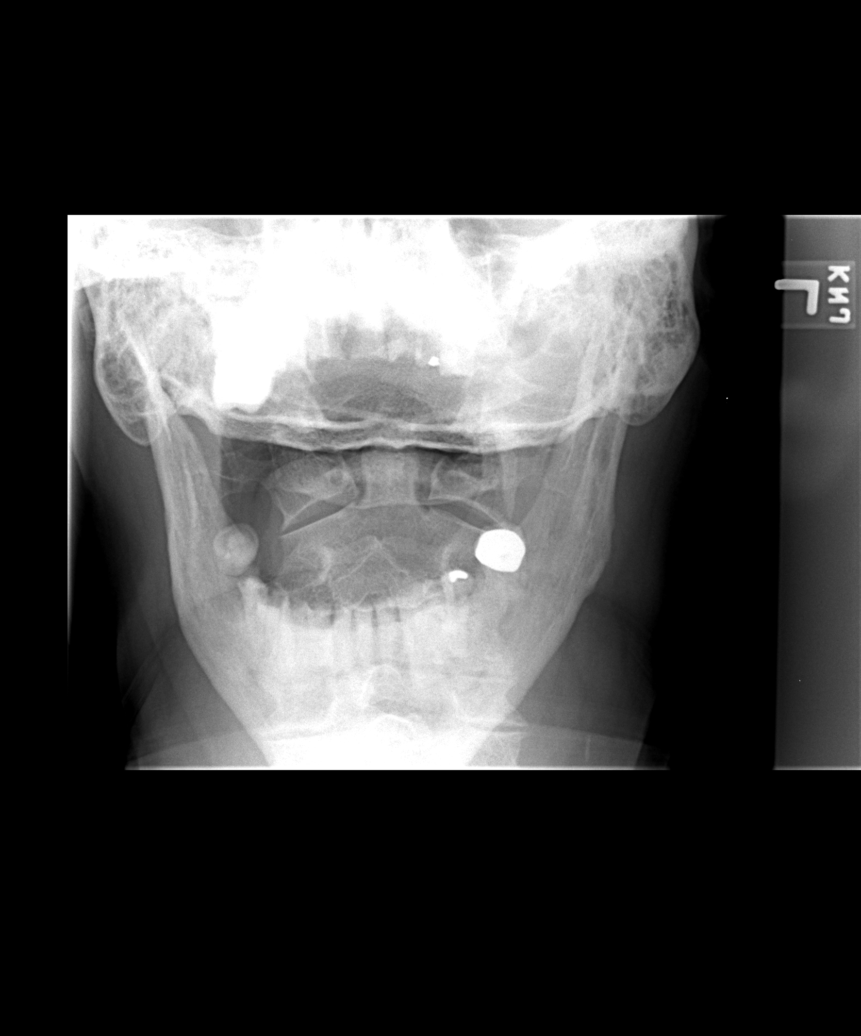

[5 of 5 positions shown; findings below may reference images not displayed]

FINDINGS: Mild endplate spur formation at C5-C6 and C6-C7.

Vertebral body and disc space heights fairly well maintained.

No acute fracture, subluxation or bone destruction.

Bones appear mildly demineralized.

Facet alignments normal.

Foramina incompletely profile but there appear to be uncovertebral
spurs at multiple foramina bilaterally greatest at left C7-T1.

Lung apices clear.

C1-C2 alignment normal.
IMPRESSION: Degenerative changes of the cervical spine as above.

## 2013-07-08 NOTE — Patient Instructions (Signed)
1. PUlmonary embolus - there may be some resorption of clot but in general the body grows over the clot - endothelialization. Did recheck the recommendations on the duration of therapy: UpToDate does state that for temporary reversible cause of clots, i.e. Surgery 3 months of therapy is adequate.  2. Numbness in the hands: no sign of carpal tunnel syndrome. Plan Check B12 level  Neck x-rays to look for DJD or DDD that could cause a problem  3. Weakness - may be just from the combination of recent surgery and the PE. Plan Will check blood counts to be sure there is no blood loss as a cause of weakness.

## 2013-07-08 NOTE — Progress Notes (Signed)
  Subjective:    Patient ID: John Blackburn, male    DOB: Aug 26, 1946, 67 y.o.   MRN: [id number]  HPI Mr. Whitely recently hospitalized for PE with saddle embolus and bilateral pulmonary artery PE. He did well and is on warfarin therapy.  He presents today c/o generalized fatigue and weakness.  paresthesia at both hands - occurs at rest or when active. No loss of strength, incoordination, interference with ADLs. He reports no injury or repetive hand movements.  PMH, FamHx and SocHx reviewed for any changes and relevance.  Current Outpatient Prescriptions on File Prior to Visit  Medication Sig Dispense Refill  . atenolol-chlorthalidone (TENORETIC) 50-25 MG per tablet TAKE ONE TABLET BY MOUTH EVERY DAY  30 tablet  5  . warfarin (COUMADIN) 5 MG tablet Take as directed by anticoagulation clinic  65 tablet  2   No current facility-administered medications on file prior to visit.      Review of Systems System review is negative for any constitutional, cardiac, pulmonary, GI or neuro symptoms or complaints other than as described in the HPI.     Objective:   Physical Exam Filed Vitals:   07/08/13 1139  BP: 144/80  Pulse: 48  Temp: 98.1 F (36.7 C)   Wt Readings from Last 3 Encounters:  07/08/13 237 lb 12.8 oz (107.865 kg)  06/25/13 229 lb 4.5 oz (104 kg)  06/25/13 233 lb 12.8 oz (106.051 kg)   Gen'l - NWWD white man in no distress Cor- 2+ radial pulse and RRR Pulm - normal respirations, normal o2 sat, Lungs CTAP MSK/Neuro - Negative Tinel's and Phalen's sign, good strength, normal sensation to light touch and vibration.  c-spine series:   Study Result    CLINICAL DATA: Numbness/paresthesias in both hands for 2 months, no  known injury  EXAM:  CERVICAL SPINE 4+ VIEWS  COMPARISON: None  FINDINGS:  Mild endplate spur formation at C5-C6 and C6-C7.  Vertebral body and disc space heights fairly well maintained.  No acute fracture, subluxation or bone destruction.  Bones  appear mildly demineralized.  Facet alignments normal.  Foramina incompletely profile but there appear to be uncovertebral  spurs at multiple foramina bilaterally greatest at left C7-T1.  Lung apices clear.  C1-C2 alignment normal.  IMPRESSION:  Degenerative changes of the cervical spine as above.          Assessment & Plan:

## 2013-07-08 NOTE — Assessment & Plan Note (Signed)
PE 06/25/13 - now in warfarin with last INR 2.0.   PUlmonary embolus - there may be some resorption of clot but in general the body grows over the clot - endothelialization. Did recheck the recommendations on the duration of therapy: UpToDate does state that for temporary reversible cause of clots, i.e. Surgery, 3 months of therapy is adequate.

## 2013-07-08 NOTE — Assessment & Plan Note (Signed)
Mild paresthesia hands not related to activity and without limitation in ADLs. Exam - not c/w Carpal tunnel. Imaging indicates DJD as probable etiology.  Plan Consider PT referral to minimize symptoms

## 2013-07-09 ENCOUNTER — Telehealth: Payer: Self-pay | Admitting: Internal Medicine

## 2013-07-09 NOTE — Telephone Encounter (Signed)
1. OK to resume exercise 2. Lab 07/08/13 - normal blood counts and normal B12. 06/27/13 - CBC was normal as well.

## 2013-07-09 NOTE — Telephone Encounter (Signed)
Results were posted to my chart.

## 2013-07-09 NOTE — Telephone Encounter (Signed)
Left message to return call 

## 2013-07-09 NOTE — Telephone Encounter (Signed)
Pt stated that he had an office visit 07/08/13 and he forgot to ask Dr. Debby Bud if he can go back to exercise ( walking on the elliptical). Pt also req phone call from the assistant for test result that was done last week, pt stated that he can not find it in his mychart. Please advise.

## 2013-07-09 NOTE — Telephone Encounter (Signed)
Patient called back and has been notified okay for exercise. Gave him lab results. He asks about xray results, please advise.

## 2013-07-10 NOTE — Telephone Encounter (Signed)
Patient notified to check mychart and he states he did last night

## 2013-07-12 ENCOUNTER — Ambulatory Visit (INDEPENDENT_AMBULATORY_CARE_PROVIDER_SITE_OTHER): Payer: Medicare Other | Admitting: General Practice

## 2013-07-12 DIAGNOSIS — I2692 Saddle embolus of pulmonary artery without acute cor pulmonale: Secondary | ICD-10-CM

## 2013-07-12 DIAGNOSIS — Z7901 Long term (current) use of anticoagulants: Secondary | ICD-10-CM

## 2013-07-12 NOTE — Progress Notes (Signed)
Pre-visit discussion using our clinic review tool. No additional management support is needed unless otherwise documented below in the visit note.  

## 2013-07-18 ENCOUNTER — Encounter: Payer: Self-pay | Admitting: Internal Medicine

## 2013-07-18 ENCOUNTER — Ambulatory Visit (INDEPENDENT_AMBULATORY_CARE_PROVIDER_SITE_OTHER): Payer: Medicare Other | Admitting: Internal Medicine

## 2013-07-18 VITALS — BP 138/68 | HR 49 | Temp 97.6°F | Wt 241.4 lb

## 2013-07-18 DIAGNOSIS — I872 Venous insufficiency (chronic) (peripheral): Secondary | ICD-10-CM

## 2013-07-18 NOTE — Progress Notes (Signed)
Pre visit review using our clinic review tool, if applicable. No additional management support is needed unless otherwise documented below in the visit note. 

## 2013-07-18 NOTE — Progress Notes (Signed)
Subjective:    Patient ID: John Blackburn, male    DOB: [birth year], 67 y.o.   MRN: [id number]  HPI Presents for a problem with swelling of the feet and ankles. He did a little reading and did see a real improvement with elevation of his legs. He is concerned this is related to DVT or coumadin.  Past Medical History  Diagnosis Date  . 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 Depression     on multiple meds. Has been seen at [facility]. and Dr. Dub Mikes is his prescriber  . Diverticulitis of colon 2000 's    treated as an outpatient  . GERD (gastroesophageal reflux disease)     UGI done August '12 - ulcer/. Dr. Noe Gens, gastroenterologist in [facility].   . Peptic ulcer     in the past and just recently-August '12  . Hypertension   . Hyperlipidemia     last lipid panel: HDL 44, LDL 117  . Hx of adenomatous colonic polyps   . Sleep apnea, primary central   . skin cancer   . Allergy    Past Surgical History  Procedure Laterality Date  . Cholecystectomy  1990    laproscopic   . Tkr bilateral  2006    Dr. Erskine Squibb  . Joint replacement      knee  . Prostate surgery      needle biopsy's  . Hernia repair    . Vasectomy     Family History  Problem Relation Age of Onset  . Stroke Mother   . Hypertension Mother   . Heart disease Mother   . Heart disease Father     CAD/MI-fatal sudden death  . Cancer Sister     colon cancer 20090-survivor  . Heart disease Paternal Uncle   . Heart disease Paternal Grandfather    History   Social History  . Marital Status: Divorced    Spouse Name: N/A    Number of Children: 3  . Years of Education: 16   Occupational History  . contractor    Social History Main Topics  . Smoking status: Never Smoker   . Smokeless tobacco: Never Used  . Alcohol Use: 0.5 oz/week    1 drink(s) per week     Comment: social  . Drug Use: No  . Sexual Activity: Not Currently    Partners: Female   Other Topics Concern  . Not on  file   Social History Narrative   [employer], [employer] -BS business admin. Married '71- 34 yrs/divorced. 3 sons - '74, '76, '87.  1 grandchild.   Work - Chief Executive Officer. Lives alone. No pets. Serially monogamous.   ACP - he has a living will: DNR, DNI.    Current Outpatient Prescriptions on File Prior to Visit  Medication Sig Dispense Refill  . atenolol-chlorthalidone (TENORETIC) 50-25 MG per tablet TAKE ONE TABLET BY MOUTH EVERY DAY  30 tablet  5  . warfarin (COUMADIN) 5 MG tablet Take as directed by anticoagulation clinic  65 tablet  2   No current facility-administered medications on file prior to visit.      Review of Systems System review is negative for any constitutional, cardiac, pulmonary, GI or neuro symptoms or complaints other than as described in the HPI.     Objective:   Physical Exam Filed Vitals:   07/18/13 1200  BP: 138/68  Pulse: 49  Temp: 97.6 F (36.4 C)  Ext - trace-1+ pedal and distal LE edema.       Assessment & Plan:

## 2013-07-19 ENCOUNTER — Ambulatory Visit (INDEPENDENT_AMBULATORY_CARE_PROVIDER_SITE_OTHER): Payer: Medicare Other | Admitting: General Practice

## 2013-07-19 DIAGNOSIS — Z7901 Long term (current) use of anticoagulants: Secondary | ICD-10-CM

## 2013-07-19 DIAGNOSIS — I2692 Saddle embolus of pulmonary artery without acute cor pulmonale: Secondary | ICD-10-CM

## 2013-07-19 LAB — POCT INR: INR: 2.2

## 2013-07-19 NOTE — Progress Notes (Signed)
Pre-visit discussion using our clinic review tool. No additional management support is needed unless otherwise documented below in the visit note.  

## 2013-07-20 DIAGNOSIS — I872 Venous insufficiency (chronic) (peripheral): Secondary | ICD-10-CM | POA: Insufficient documentation

## 2013-07-20 NOTE — Assessment & Plan Note (Signed)
Mild to moderate lower extremity venous insufficiency. No skin breakdown or chronic skin changes  Plan Patient education.  (greater than 50% of 15 min visit spent on education and counseling)

## 2013-08-02 ENCOUNTER — Ambulatory Visit (INDEPENDENT_AMBULATORY_CARE_PROVIDER_SITE_OTHER): Payer: Medicare Other | Admitting: General Practice

## 2013-08-02 DIAGNOSIS — Z7901 Long term (current) use of anticoagulants: Secondary | ICD-10-CM

## 2013-08-02 DIAGNOSIS — I2692 Saddle embolus of pulmonary artery without acute cor pulmonale: Secondary | ICD-10-CM

## 2013-08-02 LAB — POCT INR: INR: 1.7

## 2013-08-12 ENCOUNTER — Ambulatory Visit: Payer: Medicare Other | Admitting: Nurse Practitioner

## 2013-08-13 ENCOUNTER — Ambulatory Visit (INDEPENDENT_AMBULATORY_CARE_PROVIDER_SITE_OTHER): Payer: Medicare Other | Admitting: Nurse Practitioner

## 2013-08-13 ENCOUNTER — Encounter: Payer: Self-pay | Admitting: Nurse Practitioner

## 2013-08-13 VITALS — BP 110/60 | HR 58 | Ht 75.0 in | Wt 245.4 lb

## 2013-08-13 DIAGNOSIS — Z789 Other specified health status: Secondary | ICD-10-CM

## 2013-08-13 DIAGNOSIS — Z9189 Other specified personal risk factors, not elsewhere classified: Secondary | ICD-10-CM

## 2013-08-13 NOTE — Patient Instructions (Signed)
Stay on your current medicines  Regular exercise/heart health diet/weight loss are encouraged  We will see you back as needed  Call the [facility] office at [phone] if you have any questions, problems or concerns.

## 2013-08-13 NOTE — Progress Notes (Signed)
John Blackburn Date of Birth: 1946/03/11 Medical Record #[id number]  History of Present Illness: John Blackburn is seen back today for a 3 month check. Seen for Dr. Eden Emms. He has multiple CV risk factors with a positive family history for CAD.   Seen back in August for GXT - had poor exercise tolerance and hypertensive. Had EKG changes along with PVCs, couplets, runs of bigeminy and was referred for echo and Lexiscan - these turned out ok.  Comes back today. Here alone. He is doing ok. No chest pain. Not short of breath. Had prostate surgery in October - then developed chest pain/dyspnea - CT positive for PE with DVT- on coumadin for 3 months per Dr. Debby Bud. Not exercising regularly - his feet hurt. Weight is going up. Asking for an antibiotic for some chest congestion but no fever or chills. Blood pressure readings from home look good. His cuff correlates fairly well.    Current Outpatient Prescriptions  Medication Sig Dispense Refill  . atenolol-chlorthalidone (TENORETIC) 50-25 MG per tablet TAKE ONE TABLET BY MOUTH EVERY DAY  30 tablet  5  . Melatonin 1 MG/4ML LIQD Take by mouth at bedtime.      [name] warfarin (COUMADIN) 5 MG tablet Take as directed by anticoagulation clinic  65 tablet  2   No current facility-administered medications for this visit.    Allergies  Allergen Reactions  . Cephalexin Rash  . Levofloxacin Rash    Past Medical History  Diagnosis Date  . 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 Depression     on multiple meds. Has been seen at [facility]. and Dr. Dub Mikes is his prescriber  . Diverticulitis of colon 2000 's    treated as an outpatient  . GERD (gastroesophageal reflux disease)     UGI done August '12 - ulcer/. Dr. Noe Gens, gastroenterologist in [facility].   . Peptic ulcer     in the past and just recently-August '12  . Hypertension   . Hyperlipidemia     last lipid panel: HDL 44, LDL 117  . Hx of adenomatous colonic polyps   . Sleep  apnea, primary central   . skin cancer   . Allergy   . Pulmonary emboli     noted October 2014 - treated by Dr. Debby Bud    Past Surgical History  Procedure Laterality Date  . Cholecystectomy  1990    laproscopic   . Tkr bilateral  2006    Dr. Erskine Squibb  . Joint replacement      knee  . Prostate surgery      needle biopsy's  . Hernia repair    . Vasectomy      History  Smoking status  . Never Smoker   Smokeless tobacco  . Never Used    History  Alcohol Use  . 0.5 oz/week  . 1 drink(s) per week    Comment: social    Family History  Problem Relation Age of Onset  . Stroke Mother   . Hypertension Mother   . Heart disease Mother   . Heart disease Father     CAD/MI-fatal sudden death  . Cancer Sister     colon cancer 20090-survivor  . Heart disease Paternal Uncle   . Heart disease Paternal Grandfather     Review of Systems: The review of systems is per the HPI.  All other systems were reviewed and are negative.  Physical Exam: BP 110/60  Pulse  58  Ht 6\' 3"  (1.905 m)  Wt 245 lb 6.4 oz (111.313 kg)  BMI 30.67 kg/m2  SpO2 97% Patient is very pleasant and in no acute distress. Skin is warm and dry. Color is normal.  HEENT is unremarkable. Normocephalic/atraumatic. PERRL. Sclera are nonicteric. Neck is supple. No masses. No JVD. Lungs are clear. Cardiac exam shows a regular rate and rhythm. Abdomen is soft. Extremities are without edema. Gait and ROM are intact. No gross neurologic deficits noted.  Wt Readings from Last 3 Encounters:  08/13/13 245 lb 6.4 oz (111.313 kg)  07/18/13 241 lb 6.4 oz (109.498 kg)  07/08/13 237 lb 12.8 oz (107.865 kg)     LABORATORY DATA: Lab Results  Component Value Date   WBC 9.0 06/27/2013   HGB 14.2 07/08/2013   HCT 41.8 07/08/2013   PLT 179 06/27/2013   GLUCOSE 157* 06/25/2013   CHOL 161 03/26/2013   TRIG 321.0* 03/26/2013   HDL 34.10* 03/26/2013   LDLDIRECT 64.5 03/26/2013   ALT 24 06/25/2013   AST 25 06/25/2013   NA  139 06/25/2013   K 4.3 06/25/2013   CL 100 06/25/2013   CREATININE 1.1 06/25/2013   BUN 17 06/25/2013   CO2 28 06/25/2013   TSH 0.65 03/26/2013   INR 1.7 08/02/2013   Myoview Impression  Exercise Capacity: Lexiscan with low level exercise.  BP Response: Normal blood pressure response.  Clinical Symptoms: No significant symptoms noted.  ECG Impression: No significant ST segment change suggestive of ischemia.  Comparison with Prior Nuclear Study: No images to compare  Overall Impression: Normal stress nuclear study.  LV Ejection Fraction: 61%. LV Wall Motion: NL LV Function; NL Wall Motion  Charlton Haws   Echo Study Conclusions  - Left ventricle: The cavity size was normal. Wall thickness was increased in a pattern of mild LVH. Systolic function was normal. The estimated ejection fraction was in the range of 55% to 60%. Wall motion was normal; there were no regional wall motion abnormalities. Doppler parameters are consistent with abnormal left ventricular relaxation (grade 1 diastolic dysfunction). - Left atrium: The atrium was mildly dilated. - Right ventricle: The cavity size was mildly dilated.   Assessment / Plan:  1. Multiple CV risk factors - needs to really work on modification with weight loss, diet and exercise. See back prn.  2. HTN - blood pressure is ok  3. HLD - would recommend regular checks  4. PE - now on Coumadin - managed by Dr. Debby Bud  5. ?URI - does not appear acutely sick - probably viral - would defer to his PCP.   I think he is stable from our standpoint. Will see back prn. Again CV risk factor modification encouraged.   Patient is agreeable to this plan and will call if any problems develop in the interim.   Rosalio Macadamia, RN, ANP-C [facility] [address] [place], [place]  [address]

## 2013-08-14 ENCOUNTER — Encounter: Payer: Self-pay | Admitting: Nurse Practitioner

## 2013-08-14 ENCOUNTER — Ambulatory Visit (INDEPENDENT_AMBULATORY_CARE_PROVIDER_SITE_OTHER): Payer: Medicare Other | Admitting: Nurse Practitioner

## 2013-08-14 VITALS — BP 130/70 | HR 56 | Temp 97.7°F | Ht 73.0 in | Wt 244.8 lb

## 2013-08-14 DIAGNOSIS — J069 Acute upper respiratory infection, unspecified: Secondary | ICD-10-CM

## 2013-08-14 NOTE — Progress Notes (Signed)
   Subjective:    Patient ID: John Blackburn, male    DOB: [birth year], 67 y.o.   MRN: [id number]  URI  This is a new problem. The current episode started in the past 7 days (4d). The problem has been gradually worsening. There has been no fever. Associated symptoms include congestion, coughing and a sore throat. Pertinent negatives include no abdominal pain, chest pain, diarrhea, ear pain, headaches, nausea, swollen glands, vomiting or wheezing. Treatments tried: salt water sinus rinse. The treatment provided mild relief.      Review of Systems  Constitutional: Negative for fever, chills and fatigue.  HENT: Positive for congestion, postnasal drip and sore throat. Negative for ear pain.   Respiratory: Positive for cough. Negative for chest tightness, shortness of breath and wheezing.   Cardiovascular: Negative for chest pain.  Gastrointestinal: Negative for nausea, vomiting, abdominal pain and diarrhea.  Musculoskeletal: Negative for back pain.  Neurological: Negative for headaches.  Hematological: Negative for adenopathy.       Objective:   Physical Exam  Vitals reviewed. Constitutional: He is oriented to person, place, and time. He appears well-developed and well-nourished. No distress.  HENT:  Head: Normocephalic and atraumatic.  Right Ear: External ear normal.  Left Ear: External ear normal.  Mouth/Throat: Oropharynx is clear and moist. No oropharyngeal exudate.  Eyes: Conjunctivae are normal. Right eye exhibits no discharge. Left eye exhibits no discharge.  Neck: Normal range of motion. Neck supple. No thyromegaly present.  Cardiovascular: Normal rate, regular rhythm and normal heart sounds.   No murmur heard. Pulmonary/Chest: Effort normal and breath sounds normal. No respiratory distress. He has no wheezes.  Lymphadenopathy:    He has no cervical adenopathy.  Neurological: He is alert and oriented to person, place, and time.  Skin: Skin is warm and dry.  Psychiatric:  He has a normal mood and affect. His behavior is normal. Thought content normal.          Assessment & Plan:   1. Viral upper respiratory illness See pt instructions.

## 2013-08-14 NOTE — Patient Instructions (Signed)
You have a cold virus causing your symptoms. The average duration of cold symptoms is 14 days. Start daily sinus rinses (neilmed Sinus Rinse) or mix at home as discussed. You may add a 2-3 drops affrin in rinse for no more than 3 days. Then use salt water mixture without affrin for a few days. You may use mucinex twice daily for several days. Sip fluids every hour. Rest. If you are not feeling better in 1 week or develop fever or chest pain, call us for re-evaluation. Feel better!  Upper Respiratory Infection, Adult An upper respiratory infection (URI) is also sometimes known as the common cold. The upper respiratory tract includes the nose, sinuses, throat, trachea, and bronchi. Bronchi are the airways leading to the lungs. Most people improve within 1 week, but symptoms can last up to 2 weeks. A residual cough may last even longer.  CAUSES Many different viruses can infect the tissues lining the upper respiratory tract. The tissues become irritated and inflamed and often become very moist. Mucus production is also common. A cold is contagious. You can easily spread the virus to others by oral contact. This includes kissing, sharing a glass, coughing, or sneezing. Touching your mouth or nose and then touching a surface, which is then touched by another person, can also spread the virus. SYMPTOMS  Symptoms typically develop 1 to 3 days after you come in contact with a cold virus. Symptoms vary from person to person. They may include:  Runny nose.  Sneezing.  Nasal congestion.  Sinus irritation.  Sore throat.  Loss of voice (laryngitis).  Cough.  Fatigue.  Muscle aches.  Loss of appetite.  Headache.  Low-grade fever. DIAGNOSIS  You might diagnose your own cold based on familiar symptoms, since most people get a cold 2 to 3 times a year. Your caregiver can confirm this based on your exam. Most importantly, your caregiver can check that your symptoms are not due to another disease such  as strep throat, sinusitis, pneumonia, asthma, or epiglottitis. Blood tests, throat tests, and X-rays are not necessary to diagnose a common cold, but they may sometimes be helpful in excluding other more serious diseases. Your caregiver will decide if any further tests are required. RISKS AND COMPLICATIONS  You may be at risk for a more severe case of the common cold if you smoke cigarettes, have chronic heart disease (such as heart failure) or lung disease (such as asthma), or if you have a weakened immune system. The very young and very old are also at risk for more serious infections. Bacterial sinusitis, middle ear infections, and bacterial pneumonia can complicate the common cold. The common cold can worsen asthma and chronic obstructive pulmonary disease (COPD). Sometimes, these complications can require emergency medical care and may be life-threatening. PREVENTION  The best way to protect against getting a cold is to practice good hygiene. Avoid oral or hand contact with people with cold symptoms. Wash your hands often if contact occurs. There is no clear evidence that vitamin C, vitamin E, echinacea, or exercise reduces the chance of developing a cold. However, it is always recommended to get plenty of rest and practice good nutrition. TREATMENT  Treatment is directed at relieving symptoms. There is no cure. Antibiotics are not effective, because the infection is caused by a virus, not by bacteria. Treatment may include:  Increased fluid intake. Sports drinks offer valuable electrolytes, sugars, and fluids.  Breathing heated mist or steam (vaporizer or shower).  Eating chicken soup or  other clear broths, and maintaining good nutrition.  Getting plenty of rest.  Using gargles or lozenges for comfort.  Controlling fevers with ibuprofen or acetaminophen as directed by your caregiver.  Increasing usage of your inhaler if you have asthma. Zinc gel and zinc lozenges, taken in the first 24  hours of the common cold, can shorten the duration and lessen the severity of symptoms. Pain medicines may help with fever, muscle aches, and throat pain. A variety of non-prescription medicines are available to treat congestion and runny nose. Your caregiver can make recommendations and may suggest nasal or lung inhalers for other symptoms.  HOME CARE INSTRUCTIONS   Only take over-the-counter or prescription medicines for pain, discomfort, or fever as directed by your caregiver.  Use a warm mist humidifier or inhale steam from a shower to increase air moisture. This may keep secretions moist and make it easier to breathe.  Drink enough water and fluids to keep your urine clear or pale yellow.  Rest as needed.  Return to work when your temperature has returned to normal or as your caregiver advises. You may need to stay home longer to avoid infecting others. You can also use a face mask and careful hand washing to prevent spread of the virus. SEEK MEDICAL CARE IF:   After the first few days, you feel you are getting worse rather than better.  You need your caregiver's advice about medicines to control symptoms.  You develop chills, worsening shortness of breath, or brown or red sputum. These may be signs of pneumonia.  You develop yellow or brown nasal discharge or pain in the face, especially when you bend forward. These may be signs of sinusitis.  You develop a fever, swollen neck glands, pain with swallowing, or white areas in the back of your throat. These may be signs of strep throat. SEEK IMMEDIATE MEDICAL CARE IF:   You have a fever.  You develop severe or persistent headache, ear pain, sinus pain, or chest pain.  You develop wheezing, a prolonged cough, cough up blood, or have a change in your usual mucus (if you have chronic lung disease).  You develop sore muscles or a stiff neck. Document Released: 02/15/2001 Document Revised: 11/14/2011 Document Reviewed:  12/24/2010 [facility] Patient Information 2014 [place], [place].

## 2013-08-14 NOTE — Progress Notes (Signed)
Pre-visit discussion using our clinic review tool. No additional management support is needed unless otherwise documented below in the visit note.  

## 2013-08-23 ENCOUNTER — Ambulatory Visit (INDEPENDENT_AMBULATORY_CARE_PROVIDER_SITE_OTHER): Payer: Medicare Other | Admitting: General Practice

## 2013-08-23 DIAGNOSIS — Z5181 Encounter for therapeutic drug level monitoring: Secondary | ICD-10-CM

## 2013-08-23 DIAGNOSIS — I2692 Saddle embolus of pulmonary artery without acute cor pulmonale: Secondary | ICD-10-CM

## 2013-08-23 DIAGNOSIS — Z7901 Long term (current) use of anticoagulants: Secondary | ICD-10-CM

## 2013-08-23 LAB — POCT INR: INR: 2

## 2013-08-23 NOTE — Progress Notes (Signed)
Pre-visit discussion using our clinic review tool. No additional management support is needed unless otherwise documented below in the visit note.  

## 2013-09-24 ENCOUNTER — Ambulatory Visit (INDEPENDENT_AMBULATORY_CARE_PROVIDER_SITE_OTHER): Payer: Medicare Other | Admitting: General Practice

## 2013-09-24 DIAGNOSIS — Z5181 Encounter for therapeutic drug level monitoring: Secondary | ICD-10-CM

## 2013-09-24 DIAGNOSIS — Z7901 Long term (current) use of anticoagulants: Secondary | ICD-10-CM

## 2013-09-24 DIAGNOSIS — I2692 Saddle embolus of pulmonary artery without acute cor pulmonale: Secondary | ICD-10-CM

## 2013-09-24 LAB — POCT INR: INR: 1.6

## 2013-09-24 NOTE — Progress Notes (Signed)
Pre-visit discussion using our clinic review tool. No additional management support is needed unless otherwise documented below in the visit note.  

## 2013-10-14 ENCOUNTER — Other Ambulatory Visit: Payer: Self-pay | Admitting: General Practice

## 2013-10-14 ENCOUNTER — Telehealth: Payer: Self-pay | Admitting: General Practice

## 2013-10-14 MED ORDER — WARFARIN SODIUM 5 MG PO TABS: ORAL_TABLET | ORAL | Status: DC

## 2013-10-14 NOTE — Telephone Encounter (Signed)
Message copied by Warden Fillers on Mon Oct 14, 2013  2:00 PM ------      Message from: Adella Hare E      Created: Mon Oct 14, 2013  1:11 PM       Greater than 3 months of treatment for a provoked Veterinary surgeon) related PE. OK to stop.      ----- Message -----         From: Warden Fillers, RN         Sent: 10/14/2013  10:49 AM           To: Neena Rhymes, MD            Patient is asking if it's OK to stop coumadin at this time??              Thanks,      Jenny Reichmann       ------

## 2013-10-15 ENCOUNTER — Ambulatory Visit (INDEPENDENT_AMBULATORY_CARE_PROVIDER_SITE_OTHER): Payer: Medicare Other | Admitting: Internal Medicine

## 2013-10-15 ENCOUNTER — Encounter: Payer: Self-pay | Admitting: Internal Medicine

## 2013-10-15 VITALS — BP 120/62 | HR 53 | Temp 96.9°F | Wt 244.0 lb

## 2013-10-15 DIAGNOSIS — J309 Allergic rhinitis, unspecified: Secondary | ICD-10-CM | POA: Insufficient documentation

## 2013-10-15 DIAGNOSIS — I2699 Other pulmonary embolism without acute cor pulmonale: Secondary | ICD-10-CM

## 2013-10-15 MED ORDER — FLUTICASONE PROPIONATE 50 MCG/ACT NA SUSP
1.0000 | Freq: Every day | NASAL | Status: DC
Start: 2013-10-15 — End: 2014-09-18

## 2013-10-15 NOTE — Patient Instructions (Addendum)
Rhinitis - Started prescription for fluticasone (Flonase) - Takes several days of use before you see an improvement - Convenient to take medicine before brushing your teeth to prevent side effect of oral thrush. - Good long-term solution. - After you use it for a while and things are controlled, cut back to three times a week.   Allergic Rhinitis Allergic rhinitis is when the mucous membranes in the nose respond to allergens. Allergens are particles in the air that cause your body to have an allergic reaction. This causes you to release allergic antibodies. Through a chain of events, these eventually cause you to release histamine into the blood stream. Although meant to protect the body, it is this release of histamine that causes your discomfort, such as frequent sneezing, congestion, and an itchy, runny nose.  CAUSES  Seasonal allergic rhinitis (hay fever) is caused by pollen allergens that may come from grasses, trees, and weeds. Year-round allergic rhinitis (perennial allergic rhinitis) is caused by allergens such as house dust mites, pet dander, and mold spores.  SYMPTOMS   Nasal stuffiness (congestion).  Itchy, runny nose with sneezing and tearing of the eyes. DIAGNOSIS  Your health care provider can help you determine the allergen or allergens that trigger your symptoms. If you and your health care provider are unable to determine the allergen, skin or blood testing may be used. TREATMENT  Allergic Rhinitis does not have a cure, but it can be controlled by:  Medicines and allergy shots (immunotherapy).  Avoiding the allergen. Hay fever may often be treated with antihistamines in pill or nasal spray forms. Antihistamines block the effects of histamine. There are over-the-counter medicines that may help with nasal congestion and swelling around the eyes. Check with your health care provider before taking or giving this medicine.  If avoiding the allergen or the medicine prescribed do  not work, there are many new medicines your health care provider can prescribe. Stronger medicine may be used if initial measures are ineffective. Desensitizing injections can be used if medicine and avoidance does not work. Desensitization is when a patient is given ongoing shots until the body becomes less sensitive to the allergen. Make sure you follow up with your health care provider if problems continue. HOME CARE INSTRUCTIONS It is not possible to completely avoid allergens, but you can reduce your symptoms by taking steps to limit your exposure to them. It helps to know exactly what you are allergic to so that you can avoid your specific triggers. SEEK MEDICAL CARE IF:   You have a fever.  You develop a cough that does not stop easily (persistent).  You have shortness of breath.  You start wheezing.  Symptoms interfere with normal daily activities. Document Released: 05/17/2001 Document Revised: 06/12/2013 Document Reviewed: 04/29/2013 [facility] Patient Information 2014 [place].

## 2013-10-15 NOTE — Progress Notes (Signed)
Pre visit review using our clinic review tool, if applicable. No additional management support is needed unless otherwise documented below in the visit note. 

## 2013-10-15 NOTE — Progress Notes (Signed)
Subjective:     Patient ID: John Blackburn, male   DOB: April 04, 1946, 68 y.o.   MRN: [id number]  HPI Blowing nose every few minutes. Has never had such severe congestion before. Congestion is mostly clear, nose is running, not blocked. No problems with allergies, but took flonase in the past. Thinks that flonase helped his runny nose. Denies sinus pressure. No pain with swallowing or swollen lymph nodes. No fever.   Review of Systems System review is negative for any constitutional, cardiac, pulmonary, GI or neuro symptoms or complaints other than as described in the HPI.     Objective:   Physical Exam Filed Vitals:   10/15/13 1545  BP: 120/62  Pulse: 53  Temp: 96.9 F (36.1 C)   General: Well developed, well nourished, NAD, appears stated age HEENT: NCAT, PERRLA, EOMI, Anicteic Sclera, mucous membranes moist.  Neck: Supple, no JVD, no masses  Cardiovascular: S1 S2 auscultated, no rubs, murmurs or gallops. Regular rate and rhythm.  Respiratory: Clear to auscultation bilaterally with equal chest rise  Extremities: warm, dry without cyanosis, clubbing, or edema  Neuro: Alert and Oriented x3, cranial nerves II-XII grossly intact.  Skin: Without rashes, exudates, or nodules  Psych: Normal mood and affect with intact judgement and insight     Assessment:         Plan:

## 2013-10-15 NOTE — Progress Notes (Signed)
   Subjective:    Patient ID: John Blackburn, male    DOB: [birth year], 68 y.o.   MRN: [id number]  HPI    Review of Systems     Objective:   Physical Exam        Assessment & Plan:

## 2013-10-15 NOTE — Assessment & Plan Note (Addendum)
Patient reports having severe congestion and runny nose. Has previously taken flonase with good results.   Plan Resume Flonase

## 2013-10-17 NOTE — Assessment & Plan Note (Signed)
Patient has completed 3+ months of anti-coagulation.   Plan Will d/c warfarin - referenced duration of therapy PE in setting of know provacative factor UpToDate.

## 2013-11-06 ENCOUNTER — Encounter: Payer: Self-pay | Admitting: Gastroenterology

## 2013-11-13 ENCOUNTER — Ambulatory Visit (AMBULATORY_SURGERY_CENTER): Payer: Self-pay | Admitting: *Deleted

## 2013-11-13 ENCOUNTER — Telehealth: Payer: Self-pay | Admitting: *Deleted

## 2013-11-13 VITALS — Ht 73.0 in | Wt 248.2 lb

## 2013-11-13 DIAGNOSIS — Z800 Family history of malignant neoplasm of digestive organs: Secondary | ICD-10-CM

## 2013-11-13 NOTE — Telephone Encounter (Signed)
John Blackburn,  Pt filled out a release of info formed.  I saw a note in Epic that Dr. Sharlett Iles received 55 pages of transcript on 09-08-12 but I cannot find these records.  He has an OV 12-27-13 with Dr. Ardis Hughs  Thanks, Cyril Mourning

## 2013-11-13 NOTE — Progress Notes (Signed)
Pt states he has a very strong family hx of Colon Ca.  He signed a release of info for Dr. Ferdinand Lango in [facility].   He also mentioned that he has a hx of gastric ulcers and used to have EGDs "every few years"  He states he wished to have an EGD as well as his colon.  I made him an OV with Dr. Ardis Hughs for 12-27-13 at 10:30.  Ok to release info filled out an laid on [employer]

## 2013-11-14 NOTE — Telephone Encounter (Signed)
Spoke to [facility] and advised that we sent a release yesterday for records. It appears that they sent records to [place] in January 2014 (55 pgs), however, they are not scanned into our system. Langley Gauss will resend records for review.

## 2013-11-20 ENCOUNTER — Encounter: Payer: Medicare Other | Admitting: Gastroenterology

## 2013-11-28 ENCOUNTER — Telehealth: Payer: Self-pay | Admitting: Gastroenterology

## 2013-11-28 NOTE — Telephone Encounter (Signed)
Colonoscopy Dr. Verl Blalock, May 1990, done for "family history of colon cancer" examination was unremarkable, no polyps. Colonoscopy Dr. Verl Blalock July 1996 done for "strong family history of colon cancer", no polyps were found. Colonoscopy Dr. Verl Blalock September 2000, done for "very strong family history of colon cancer", findings were left-sided diverticulosis, no polyps, he was recommended to have repeat colonoscopy in 3-5 years and annual Hemoccult testing. Colonoscopy Dr. Verl Blalock may 2006 done for "history of adenomatous polyps" findings left-sided diverticulosis, no polyps, recommended to have repeat colonoscopy at 5 years. Colonoscopy reported in office note by Dr.  Ferdinand Lango, "January 2009. One polyp removed from 18 cm which path showed to be tubular adenoma with low-grade dysplasia."  The procedure report and path report from this 2009 procedure were not sent. At that time the patient was in Dr. Ferdinand Lango office requesting to have colonoscopy done for family history of colon cancer. Dr. Collier Salina decided to do that February 2012.  Colonoscopy Dr. Ferdinand Lango, February 2012. No polyps were found, bowel prep" in adequate". He was recommended to have repeat colonoscopy at one year with more extensive prep.  Colonoscopy June 2013, Dr. Ruthell Rummage, indications " Personal history of colon polyps, family history of colon cancer" no polyps were found, bowel prep was documented to be "inadequate"   EGD Dr. Ferdinand Lango, August 2012, indications "GERD", findings "esophagitis, gastritis, ulcer, gastric" several biopsies were taken. None of these biopsy reports were sent.  EGD Dr. Ferdinand Lango January 2013 indications "GERD, gastric ulcer followup" findings are "gastritis, esophagitis, ulcer, gastric" he was advised a CBC in the office in 2 weeks, to continue omeprazole twice daily, to increase his Carafate to 4 times per day, and to have a repeat EGD in 2 months . EGD Dr. Ferdinand Lango June 2013, indications "GERD,  gastric ulcer followup". Findings gastritis, gastric ulcer healed, esophagitis. He was recommended to followup in the office.

## 2013-12-04 ENCOUNTER — Ambulatory Visit (INDEPENDENT_AMBULATORY_CARE_PROVIDER_SITE_OTHER): Payer: Medicare Other | Admitting: General Practice

## 2013-12-04 DIAGNOSIS — I2692 Saddle embolus of pulmonary artery without acute cor pulmonale: Secondary | ICD-10-CM

## 2013-12-04 DIAGNOSIS — Z7901 Long term (current) use of anticoagulants: Secondary | ICD-10-CM

## 2013-12-04 LAB — POCT INR: INR: 0.9

## 2013-12-04 NOTE — Progress Notes (Signed)
Pre visit review using our clinic review tool, if applicable. No additional management support is needed unless otherwise documented below in the visit note. 

## 2013-12-11 NOTE — Addendum Note (Signed)
Addended by: Lowry Ram on: 12/11/2013 04:10 PM   Modules accepted: Level of Service

## 2013-12-27 ENCOUNTER — Ambulatory Visit: Payer: Medicare Other | Admitting: Gastroenterology

## 2014-01-09 ENCOUNTER — Telehealth: Payer: Self-pay

## 2014-01-09 MED ORDER — ATENOLOL-CHLORTHALIDONE 50-25 MG PO TABS: ORAL_TABLET | ORAL | Status: DC

## 2014-01-09 NOTE — Telephone Encounter (Signed)
The patient called and is hoping to get his blood pressure med (atenolol) called in.  He is a former pt of Dr.Norins, and is hoping to establish with the new physician in [place].   [place] on [address]

## 2014-02-18 ENCOUNTER — Ambulatory Visit (INDEPENDENT_AMBULATORY_CARE_PROVIDER_SITE_OTHER): Payer: Medicare Other | Admitting: Gastroenterology

## 2014-02-18 ENCOUNTER — Encounter: Payer: Self-pay | Admitting: Gastroenterology

## 2014-02-18 VITALS — BP 120/68 | HR 72 | Ht 73.0 in | Wt 247.8 lb

## 2014-02-18 DIAGNOSIS — Z800 Family history of malignant neoplasm of digestive organs: Secondary | ICD-10-CM

## 2014-02-18 DIAGNOSIS — Z86010 Personal history of colonic polyps: Secondary | ICD-10-CM

## 2014-02-18 MED ORDER — MOVIPREP 100 G PO SOLR: 1.0000 | Freq: Once | ORAL | Status: DC

## 2014-02-18 NOTE — Progress Notes (Signed)
HPI: This is a   very pleasant 68 year old man who I am meeting for the first time today.  Here to discuss colonoscopy and EGD for h/o polyps  His sister had colon cancer.  His maternal GM. His father had colon cancer, two brothers also had colon cancer.  One cousin had colon cancer.  No gi bleeding, + weight gain.  No significant abdominal pains nausea or vomiting.  I reviewed about an inch thick of papers, previous records, this took about a half hour and is summarized below.   Colonoscopy Dr. Verl Blalock, May 1990, done for "family history of colon cancer" examination was unremarkable, no polyps.  Colonoscopy Dr. Verl Blalock July 1996 done for "strong family history of colon cancer", no polyps were found.  Colonoscopy Dr. Verl Blalock September 2000, done for "very strong family history of colon cancer", findings were left-sided diverticulosis, no polyps, he was recommended to have repeat colonoscopy in 3-5 years and annual Hemoccult testing.  Colonoscopy Dr. Verl Blalock may 2006 done for "history of adenomatous polyps" findings left-sided diverticulosis, no polyps, recommended to have repeat colonoscopy at 5 years.  Colonoscopy reported in office note by Dr. Ferdinand Lango, "January 2009. One polyp removed from 18 cm which path showed to be tubular adenoma with low-grade dysplasia." The procedure report and path report from this 2009 procedure were not sent. At that time the patient was in Dr. Ferdinand Lango office requesting to have colonoscopy done for family history of colon cancer. Dr. Collier Salina decided to do that February 2012.  Colonoscopy Dr. Ferdinand Lango, February 2012. No polyps were found, bowel prep" in adequate". He was recommended to have repeat colonoscopy at one year with more extensive prep.  Colonoscopy June 2013, Dr. Ruthell Rummage, indications " Personal history of colon polyps, family history of colon cancer" no polyps were found, bowel prep was documented to be "inadequate"    EGD Dr.  Ferdinand Lango, August 2012, indications "GERD", findings "esophagitis, gastritis, ulcer, gastric" several biopsies were taken. None of these biopsy reports were sent.  EGD Dr. Ferdinand Lango January 2013 indications "GERD, gastric ulcer followup" findings are "gastritis, esophagitis, ulcer, gastric" he was advised a CBC in the office in 2 weeks, to continue omeprazole twice daily, to increase his Carafate to 4 times per day, and to have a repeat EGD in 2 months .  EGD Dr. Ferdinand Lango June 2013, indications "GERD, gastric ulcer followup". Findings gastritis, gastric ulcer healed, esophagitis. He was recommended to followup in the office.     Review of systems: Pertinent positive and negative review of systems were noted in the above HPI section. Complete review of systems was performed and was otherwise normal.    Past Medical History  Diagnosis Date  . 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 Depression     on multiple meds. Has been seen at [facility]. and Dr. Sabra Heck is his prescriber  . Diverticulitis of colon 2000 's    treated as an outpatient  . GERD (gastroesophageal reflux disease)     UGI done August '12 - ulcer/. Dr. Ferdinand Lango, gastroenterologist in [facility].   . Peptic ulcer     in the past and just recently-August '12  . Hypertension   . Hyperlipidemia     last lipid panel: HDL 44, LDL 117  . Hx of adenomatous colonic polyps   . Sleep apnea, primary central     wears CPAP  . Allergy   . Pulmonary emboli  noted October 2014 - treated by Dr. Linda Hedges  . skin cancer     skin CA  . Clotting disorder     November 2014, no longer on blood thinngers    Past Surgical History  Procedure Laterality Date  . Cholecystectomy  1990    laproscopic   . Tkr bilateral  2006    Dr. Violet Baldy  . Joint replacement      knee  . Prostate surgery      needle biopsy's, TURP  . Hernia repair    . Vasectomy    . Upper gastrointestinal endoscopy      Current Outpatient  Prescriptions  Medication Sig Dispense Refill  . atenolol-chlorthalidone (TENORETIC) 50-25 MG per tablet TAKE ONE TABLET BY MOUTH EVERY DAY  30 tablet  5  . fluticasone (FLONASE) 50 MCG/ACT nasal spray Place 1 spray into both nostrils daily.  16 g  6  . Melatonin 1 MG/4ML LIQD Take by mouth at bedtime.      . Multiple Vitamin (MULTI-VITAMINS) TABS Take by mouth.      . venlafaxine XR (EFFEXOR-XR) 150 MG 24 hr capsule        No current facility-administered medications for this visit.    Allergies as of 02/18/2014 - Review Complete 02/18/2014  Allergen Reaction Noted  . Cephalexin Rash 05/03/2011  . Levofloxacin Rash 05/03/2011    Family History  Problem Relation Age of Onset  . Stroke Mother   . Hypertension Mother   . Heart disease Mother   . Heart disease Father     CAD/MI-fatal sudden death  . Colon cancer Father   . Cancer Sister     colon cancer 20090-survivor  . Colon cancer Sister   . Heart disease Paternal Uncle   . Colon cancer Paternal Uncle   . Heart disease Paternal Grandfather   . Colon cancer Paternal Aunt   . Stomach cancer Neg Hx   . Rectal cancer Neg Hx   . Esophageal cancer Neg Hx   . Colon cancer Cousin   . Colon cancer Cousin   . Colon cancer Paternal Uncle     History   Social History  . Marital Status: Divorced    Spouse Name: N/A    Number of Children: 3  . Years of Education: 16   Occupational History  . contractor    Social History Main Topics  . Smoking status: Never Smoker   . Smokeless tobacco: Never Used  . Alcohol Use: 0.5 oz/week    1 drink(s) per week     Comment: social  . Drug Use: No  . Sexual Activity: Not Currently    Partners: Female   Other Topics Concern  . 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 3 sons - '74, '76, '87.  1 grandchild.   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 No pets. Serially monogamous.   ACP - he has a  living will: DNR, DNI.       Physical Exam: BP 120/68  Pulse 72  Ht 6\' 1"  (1.854 m)  Wt 247 lb 12.8 oz (112.401 kg)  BMI 32.70 kg/m2 Constitutional: generally well-appearing Psychiatric: alert and oriented x3 Eyes: extraocular movements intact Mouth: oral pharynx moist, no lesions Neck: supple no lymphadenopathy Cardiovascular: heart regular rate and rhythm Lungs: clear to auscultation bilaterally Abdomen: soft, nontender, nondistended, no obvious ascites, no peritoneal signs, normal bowel sounds Extremities: no lower extremity  edema bilaterally Skin: no lesions on visible extremities    Assessment and plan: 68 y.o. male with  strong family history of colon cancer, personal history of colon polyps, most recent 2 colonoscopies were poorly prepped  He has not had a thorough examination of his colon in at least 6 years that I can tell. He is a very strong family history of colon cancer. I recommended we proceed with colonoscopy at his soonest convenience with a "double prep". Given his very strong family history of colon cancer, seems like it is running on his father's side, we will arrange for genetic referral to clarify whether he has a familial syndrome. Some of these syndromes require colonoscopies every 1-2 years.

## 2014-02-18 NOTE — Patient Instructions (Addendum)
You will be set up for a colonoscopy (with double prep) for family history of colon cancer, personal history of colon polyps ([place]). Referral to genetic councilor for very strong FH of colon cancer.  Please call [phone] if you have not heard from the genetics counselor in 1 week.

## 2014-02-19 ENCOUNTER — Telehealth: Payer: Self-pay | Admitting: Genetic Counselor

## 2014-02-19 ENCOUNTER — Telehealth: Payer: Self-pay | Admitting: Gastroenterology

## 2014-02-19 NOTE — Telephone Encounter (Signed)
S/W PATIENT AND GAVE GENETIC APPT FOR 07/16 @ 1:30 W/GENETIC COUNSELOR.  REFERRING DR. DAN JACOBS DX- [place] OF COLON CA WELCOME PACKET MAILED.

## 2014-02-20 NOTE — Telephone Encounter (Signed)
New instructions have been sent to my chart and mailed to the home

## 2014-02-28 ENCOUNTER — Telehealth: Payer: Self-pay | Admitting: Gastroenterology

## 2014-02-28 NOTE — Telephone Encounter (Signed)
The pt was re instructed and will complete the prep as directed, he will call with any further questions or concerns

## 2014-03-03 ENCOUNTER — Encounter: Payer: Medicare Other | Admitting: Gastroenterology

## 2014-03-04 NOTE — Telephone Encounter (Signed)
Please close encounter, Thanks! SR

## 2014-03-05 ENCOUNTER — Ambulatory Visit (AMBULATORY_SURGERY_CENTER): Payer: Medicare Other | Admitting: Gastroenterology

## 2014-03-05 ENCOUNTER — Encounter: Payer: Self-pay | Admitting: Gastroenterology

## 2014-03-05 VITALS — BP 115/64 | HR 61 | Temp 97.8°F | Resp 23 | Ht 73.0 in | Wt 247.0 lb

## 2014-03-05 DIAGNOSIS — K5730 Diverticulosis of large intestine without perforation or abscess without bleeding: Secondary | ICD-10-CM

## 2014-03-05 DIAGNOSIS — Z800 Family history of malignant neoplasm of digestive organs: Secondary | ICD-10-CM

## 2014-03-05 DIAGNOSIS — D126 Benign neoplasm of colon, unspecified: Secondary | ICD-10-CM

## 2014-03-05 DIAGNOSIS — Z86010 Personal history of colonic polyps: Secondary | ICD-10-CM

## 2014-03-05 MED ORDER — SODIUM CHLORIDE 0.9 % IV SOLN: 500.0000 mL | INTRAVENOUS | Status: DC

## 2014-03-05 NOTE — Patient Instructions (Signed)
YOU HAD AN ENDOSCOPIC PROCEDURE TODAY AT THE [facility]: Refer to the procedure report that was given to you for any specific questions about what was found during the examination.  If the procedure report does not answer your questions, please call your gastroenterologist to clarify.  If you requested that your care partner not be given the details of your procedure findings, then the procedure report has been included in a sealed envelope for you to review at your convenience later.  YOU SHOULD EXPECT: Some feelings of bloating in the abdomen. Passage of more gas than usual.  Walking can help get rid of the air that was put into your GI tract during the procedure and reduce the bloating. If you had a lower endoscopy (such as a colonoscopy or flexible sigmoidoscopy) you may notice spotting of blood in your stool or on the toilet paper. If you underwent a bowel prep for your procedure, then you may not have a normal bowel movement for a few days.  DIET: Your first meal following the procedure should be a light meal and then it is ok to progress to your normal diet.  A half-sandwich or bowl of soup is an example of a good first meal.  Heavy or fried foods are harder to digest and may make you feel nauseous or bloated.  Likewise meals heavy in dairy and vegetables can cause extra gas to form and this can also increase the bloating.  Drink plenty of fluids but you should avoid alcoholic beverages for 24 hours.  ACTIVITY: Your care partner should take you home directly after the procedure.  You should plan to take it easy, moving slowly for the rest of the day.  You can resume normal activity the day after the procedure however you should NOT DRIVE or use heavy machinery for 24 hours (because of the sedation medicines used during the test).    SYMPTOMS TO REPORT IMMEDIATELY: A gastroenterologist can be reached at any hour.  During normal business hours, 8:30 AM to 5:00 PM Monday through Friday,  call [phone].  After hours and on weekends, please call the GI answering service at [phone] who will take a message and have the physician on call contact you.   Following lower endoscopy (colonoscopy or flexible sigmoidoscopy):  Excessive amounts of blood in the stool  Significant tenderness or worsening of abdominal pains  Swelling of the abdomen that is new, acute  Fever of 100F or higher    FOLLOW UP: If any biopsies were taken you will be contacted by phone or by letter within the next 1-3 weeks.  Call your gastroenterologist if you have not heard about the biopsies in 3 weeks.  Our staff will call the home number listed on your records the next business day following your procedure to check on you and address any questions or concerns that you may have at that time regarding the information given to you following your procedure. This is a courtesy call and so if there is no answer at the home number and we have not heard from you through the emergency physician on call, we will assume that you have returned to your regular daily activities without incident.  SIGNATURES/CONFIDENTIALITY: You and/or your care partner have signed paperwork which will be entered into your electronic medical record.  These signatures attest to the fact that that the information above on your After Visit Summary has been reviewed and is understood.  Full responsibility of the confidentiality   of this discharge information lies with you and/or your care-partner.   Polyp and diverticulosis information given.  Next colonoscopy 5 years-2020.

## 2014-03-05 NOTE — Progress Notes (Signed)
Called to room to assist during endoscopic procedure.  Patient ID and intended procedure confirmed with present staff. Received instructions for my participation in the procedure from the performing physician.  

## 2014-03-05 NOTE — Progress Notes (Signed)
A/ox3 pleased with MAC, report to Jane RN 

## 2014-03-05 NOTE — Op Note (Signed)
[facility]  [address] [place], [id number]   COLONOSCOPY PROCEDURE REPORT  PATIENT: John Blackburn, John Blackburn  MR#: [id number] BIRTHDATE: 11-07-1945 , 67  yrs. old GENDER: Male ENDOSCOPIST: Milus Banister, MD REFERRED YF:[id number] Marquis Lunch, M.D. PROCEDURE DATE:  03/05/2014 PROCEDURE:   Colonoscopy with biopsy and snare polypectomy First Screening Colonoscopy - Avg.  risk and is 50 yrs.  old or older - No.  Prior Negative Screening - Now for repeat screening. N/A  History of Adenoma - Now for follow-up colonoscopy & has been > or = to 3 yrs.  Yes hx of adenoma.  Has been 3 or more years since last colonoscopy.  Polyps Removed Today? Yes. ASA CLASS:   Class II INDICATIONS:Colonoscopy Dr.  Verl Blalock, May 1990, done for "family history of colon cancer" examination was unremarkable, no polyps.. Colonoscopy Dr. Verl Blalock, May 1990, done for "family history of colon cancer" examination was unremarkable, no polyps.Colonoscopy Dr. Verl Blalock July 1996 done for "strong family history of colon cancer", no polyps were found. Colonoscopy Dr. Verl Blalock September 2000, done for "very strong family history of colon cancer", findings were left-sided diverticulosis, no polyps, he was recommended to have repeat colonoscopy in 3-5 years and annual Hemoccult testing. Colonoscopy Dr. Verl Blalock may 2006 done for "history of adenomatous polyps" findings left-sided diverticulosis, no polyps, recommended to have repeat colonoscopy at 5 years. Colonoscopy reported in office note by Dr. Ferdinand Lango, "January 2009. One polyp removed from 18 cm which path showed to be tubular adenoma with low-grade dysplasia." Colonoscopy Dr. Ferdinand Lango, February 2012. No polyps were found, bowel prep" in adequate". He was recommended to have repeat colonoscopy at one year with more extensive prep. Colonoscopy June 2013, Dr. Ruthell Rummage, indications " Personal history of colon polyps, family  history of colon cancer" no polyps were found, bowel prep was documented to be "inadequate" MEDICATIONS: MAC sedation, administered by CRNA and propofol (Diprivan) 200mg  IV  DESCRIPTION OF PROCEDURE:   After the risks benefits and alternatives of the procedure were thoroughly explained, informed consent was obtained.  A digital rectal exam revealed no abnormalities of the rectum.   The LB [id number]  endoscope was introduced through the anus and advanced to the cecum, which was identified by both the appendix and ileocecal valve. No adverse events experienced.   The quality of the prep was excellent.  The instrument was then slowly withdrawn as the colon was fully examined.  COLON FINDINGS: Two polyps were found, removed and sent to pathology.  One was sessile, 60mm across, located in transverse segment, removed with cold snare (jar 1).  The other was sessile, 68mm across, located in transverse segment, removed with biopsy (jar 1).  There were a few diverticulum in the left colon.  The examination was otherwise normal.  Retroflexed views revealed no abnormalities. The time to cecum=5 minutes 50 seconds.  Withdrawal time=9 minutes 22 seconds.  The scope was withdrawn and the procedure completed. COMPLICATIONS: There were no complications.  ENDOSCOPIC IMPRESSION: Two polyps were found, removed and sent to pathology.  One was sessile, 32mm across, located in transverse segment, removed with cold snare (jar 1).  The other was sessile, 33mm across, located in transverse segment, removed with biopsy (jar 1).  There were a few diverticulum in the left colon.  The examination was otherwise normal.  RECOMMENDATIONS: Given your significant family history of colon cancer, you should have a repeat colonoscopy in 5 years even if the polyps removed today are  not precancerous.  You will receive a letter within 1-2 weeks with the results of your biopsy as well as final recommendations.   Please call my office if you have not received a letter after 3 weeks.   eSigned:  Milus Banister, MD 03/05/2014 9:20 AM     PATIENT NAME:  John Blackburn, John Blackburn MR#: [id number]

## 2014-03-06 ENCOUNTER — Telehealth: Payer: Self-pay | Admitting: *Deleted

## 2014-03-06 NOTE — Telephone Encounter (Signed)
  Follow up Call-  No answer, left message to call if questions or concerns.    

## 2014-03-16 ENCOUNTER — Encounter: Payer: Self-pay | Admitting: Gastroenterology

## 2014-03-20 ENCOUNTER — Other Ambulatory Visit: Payer: Medicare Other

## 2014-03-20 ENCOUNTER — Ambulatory Visit (HOSPITAL_BASED_OUTPATIENT_CLINIC_OR_DEPARTMENT_OTHER): Payer: Medicare Other | Admitting: Genetic Counselor

## 2014-03-20 DIAGNOSIS — D126 Benign neoplasm of colon, unspecified: Secondary | ICD-10-CM | POA: Insufficient documentation

## 2014-03-20 DIAGNOSIS — Z800 Family history of malignant neoplasm of digestive organs: Secondary | ICD-10-CM

## 2014-03-20 DIAGNOSIS — IMO0002 Reserved for concepts with insufficient information to code with codable children: Secondary | ICD-10-CM

## 2014-03-20 NOTE — Progress Notes (Signed)
HISTORY OF PRESENT ILLNESS: Dr. Ardis Hughs requested a cancer genetics consultation for John Blackburn, a 68 y.o. male, due to a family history of colorectal cancer and concerns for a hereditary predisposition to cancer.  John Blackburn presents to clinic today to discuss this family history, genetic testing, and to further clarify his future cancer risks, as well as potential cancer risk for family members. John Blackburn has no personal history of cancer.   Dr. Ardis Hughs had kindly reviewed and outlined John Blackburn's numerous screening procedures, which Dr. Ardis Hughs outlines below. This is taken directly from Dr. Ardis Hughs' clinic note on 02/18/2014 :  Colonoscopy Dr. Verl Blalock, May 1990, done for "family history of colon cancer" examination was unremarkable, no polyps.  Colonoscopy Dr. Verl Blalock July 1996 done for "strong family history of colon cancer", no polyps were found.  Colonoscopy Dr. Verl Blalock September 2000, done for "very strong family history of colon cancer", findings were left-sided diverticulosis, no polyps, he was recommended to have repeat colonoscopy in 3-5 years and annual Hemoccult testing.  Colonoscopy Dr. Verl Blalock may 2006 done for "history of adenomatous polyps" findings left-sided diverticulosis, no polyps, recommended to have repeat colonoscopy at 5 years.  Colonoscopy reported in office note by Dr. Ferdinand Lango, "January 2009. One polyp removed from 18 cm which path showed to be tubular adenoma with low-grade dysplasia." The procedure report and path report from this 2009 procedure were not sent. At that time the patient was in Dr. Ferdinand Lango office requesting to have colonoscopy done for family history of colon cancer. Dr. Collier Salina decided to do that February 2012.  Colonoscopy Dr. Ferdinand Lango, February 2012. No polyps were found, bowel prep" in adequate". He was recommended to have repeat colonoscopy at one year with more extensive prep.  Colonoscopy June 2013, Dr. Ruthell Rummage,  indications " Personal history of colon polyps, family history of colon cancer" no polyps were found, bowel prep was documented to be "inadequate"  EGD Dr. Ferdinand Lango, August 2012, indications "GERD", findings "esophagitis, gastritis, ulcer, gastric" several biopsies were taken. None of these biopsy reports were sent.  EGD Dr. Ferdinand Lango January 2013 indications "GERD, gastric ulcer followup" findings are "gastritis, esophagitis, ulcer, gastric" he was advised a CBC in the office in 2 weeks, to continue omeprazole twice daily, to increase his Carafate to 4 times per day, and to have a repeat EGD in 2 months .  EGD Dr. Ferdinand Lango June 2013, indications "GERD, gastric ulcer followup". Findings gastritis, gastric ulcer healed, esophagitis. He was recommended to followup in the office.    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 Has been seen at [facility]. and Dr. Sabra Heck is his prescriber   Diverticulitis of colon 2000 's    treated as an outpatient   GERD (gastroesophageal reflux disease)     UGI done August '12 - ulcer/. Dr. Ferdinand Lango, gastroenterologist in [facility].    Peptic ulcer     in the past and just recently-August '12   Hypertension    Hyperlipidemia     last lipid panel: HDL 44, LDL 117   Hx of adenomatous colonic polyps    Sleep apnea, primary central     wears CPAP   Allergy    Pulmonary emboli     noted October 2014 - treated by Dr. Linda Hedges   skin cancer     skin CA   Clotting  disorder     November 2014, no longer on blood thinngers    Past Surgical History  Procedure Laterality Date   Cholecystectomy  1990    laproscopic    Tkr bilateral  2006    Dr. Violet Baldy   Joint replacement      knee   Prostate surgery      needle biopsy's, TURP   Hernia repair     Vasectomy     Upper gastrointestinal endoscopy      History   Social History   Marital Status: Divorced     Spouse Name: N/A    Number of Children: 3   Years of Education: 22   Occupational History   contractor    Social History Main Topics   Smoking status: Never Smoker    Smokeless tobacco: Never Used   Alcohol Use: 0.5 oz/week    1 drink(s) per week     Comment: social   Drug Use: No   Sexual Activity: Not Currently    Partners: Female   Other Topics Concern   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 3 sons - '74, '76, '87.  1 grandchild.   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 No pets. Serially monogamous.   ACP - he has a living will: DNR, DNI.     FAMILY HISTORY:  During the visit, a 4-generation pedigree was obtained. Significant diagnoses include the following:  Family History  Problem Relation Age of Onset   Stroke Mother    Hypertension Mother    Heart disease Mother    Heart disease Father     CAD/MI-fatal sudden death   Colon cancer Father    Cancer Sister     colon cancer 20090-survivor   Colon cancer Sister    Heart disease Paternal Uncle    Colon cancer Paternal Uncle    Heart disease Paternal Grandfather    Colon cancer Paternal Aunt    Stomach cancer Neg Hx    Rectal cancer Neg Hx    Esophageal cancer Neg Hx    Colon cancer Cousin    Colon cancer Cousin    Colon cancer Paternal Uncle      John Blackburn's ancestry is of Caucasian descent. There is no known Jewish ancestry or consanguinity.  GENETIC COUNSELING ASSESSMENT: John Blackburn is a 68 y.o. male with a significant family history of colorectal cancer highly suggestive of a hereditary predisposition to cancer. We, therefore, discussed and recommended the following at today's visit.   DISCUSSION: We reviewed the characteristics, features and inheritance patterns of hereditary colorectal cancer syndromes. We also discussed genetic testing, including the appropriate family members to  test, the process of testing, insurance coverage and turn-around-time for results.   At this time, we recommended John Blackburn's sister, Butch Penny, who was diagnosed with colorectal cancer at age 65 have genetic counseling and current genetic testing for a hereditary colorectal cancer gene panel, if she has not already done so. We discussed that it is always most informative to initiate genetic testing in a family member diagnosed with cancer, as it can help [place] better determine appropriate testing and genetic test results in an unaffected family member. In addition, we feel this is a more cost-effective approach as unfortunately, Medicare will not cover John Blackburn's genetic test at this time, because he does not meet their testing criteria. Butch Penny, does in fact meet their criteria. If we  can identify a gene mutation in [place], we would then test John Blackburn for this specific mutation, which would be a more affordable test. John Blackburn will speak with Butch Penny and let us know if we can be of any assistance in coordinating genetic counseling and/or testing.   In the meantime, we recommended John Blackburn be followed as a high risk patient, as he is likely at 50% risk for the hereditary colorectal cancer syndrome in the family. We recommend he have a colonoscopy every 12-18 months until we are able to determine the molecular cause of the cancer and the family and then determine if John Blackburn has inherited it.   We also encouraged John Blackburn to remain in contact with cancer genetics annually so that we can continuously update the family history and inform him of any changes in cancer genetics and testing that may be of benefit for this family. Mr.  Blackburn questions were answered to his satisfaction today. Our contact information was provided should additional questions or concerns arise.   Thank you for the referral and allowing [place] to share in the care of your patient.   The patient was seen for a total of 25  minutes in face-to-face genetic counseling.  This patient was discussed with Dr. Ardis Hughs who agrees with the above.    _______________________________________________________________________ For Office Staff:  Number of people involved in session: 2 Was an Intern/ student involved with case: not applicable

## 2014-06-16 ENCOUNTER — Telehealth: Payer: Self-pay | Admitting: Genetic Counselor

## 2014-06-16 NOTE — Telephone Encounter (Signed)
Patient called looking for [facility].  I offered to set him up with another appointment to see Cat or to have her call him.  He asked that she call him.  Let Cat know that patient would like a call.

## 2014-06-26 ENCOUNTER — Other Ambulatory Visit: Payer: Medicare Other

## 2014-06-26 NOTE — Progress Notes (Signed)
06/26/2014: John Blackburn family member with colorectal cancer has chosen not to have genetic testing. We, therefore, recommended John Blackburn pursue testing through [facility] which will test him, despite Medicare not covering his test. His blood was drawn today, the test for the Colorectal Cancer gene panel was coordinated with [facility]. Results for the test should be available in 4-6 weeks and we will contact John Blackburn once we have his results to discuss them further. John Blackburn knows he is welcome to call or email in the meantime if he has any questions. Catherine A. Fine, MS, CGC

## 2014-08-07 ENCOUNTER — Encounter: Payer: Self-pay | Admitting: Genetic Counselor

## 2014-08-07 DIAGNOSIS — Z1379 Encounter for other screening for genetic and chromosomal anomalies: Secondary | ICD-10-CM | POA: Insufficient documentation

## 2014-08-22 ENCOUNTER — Encounter (HOSPITAL_COMMUNITY): Payer: Self-pay | Admitting: *Deleted

## 2014-08-22 ENCOUNTER — Emergency Department (INDEPENDENT_AMBULATORY_CARE_PROVIDER_SITE_OTHER)
Admission: EM | Admit: 2014-08-22 | Discharge: 2014-08-22 | Disposition: A | Discharge status: Home / Self Care | Payer: Medicare Other | Source: Home / Self Care | Attending: Family Medicine | Admitting: Family Medicine

## 2014-08-22 DIAGNOSIS — S61412A Laceration without foreign body of left hand, initial encounter: Secondary | ICD-10-CM

## 2014-08-22 DIAGNOSIS — Z566 Other physical and mental strain related to work: Secondary | ICD-10-CM

## 2014-08-22 DIAGNOSIS — W102XXA Fall (on)(from) incline, initial encounter: Secondary | ICD-10-CM

## 2014-08-22 NOTE — ED Notes (Addendum)
Pt reports he fell on a brick patio and now has a laceration on his left hand, dorsal surface, approx 1 cm with bleeding controlled.[name]  He also hit his head on a flowerpot he denies any loss of consciousness    He states also that he has had confusion and memor  problem the past week .   He is oriented X 4.  Last tetanus was 3 years ago per pt

## 2014-08-22 NOTE — ED Provider Notes (Signed)
CSN: [id number]     Arrival date & time 08/22/14  1353 History   First MD Initiated Contact with Patient 08/22/14 1408     Chief Complaint  Patient presents with  . Extremity Laceration   (Consider location/radiation/quality/duration/timing/severity/associated sxs/prior Treatment) HPI Comments: 68 year old male states that just as he was beginning to lean over to collect a [employer] he went backwards on a small slant and fail striking the back of his left hand on a cement surface. This produced a 1 cm superficial laceration between the second and third metacarpals. He is also complaining of soreness to the right hip even though he denies falling on the right side of his body or striking his hip.  He denies loss of consciousness, dizziness or other symptoms prior to the fall or after the fall. He is concerned that while starting a new job this week and given the rules and regulations that he was having memory problems trying to keep all of these in mind. Other than that he denies neurologic symptoms.   Past Medical History  Diagnosis Date  . 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 Depression     on multiple meds. Has been seen at [facility]. and Dr. Sabra Heck is his prescriber  . Diverticulitis of colon 2000 's    treated as an outpatient  . GERD (gastroesophageal reflux disease)     UGI done August '12 - ulcer/. Dr. Ferdinand Lango, gastroenterologist in [facility].   . Peptic ulcer     in the past and just recently-August '12  . Hypertension   . Hyperlipidemia     last lipid panel: HDL 44, LDL 117  . Hx of adenomatous colonic polyps   . Sleep apnea, primary central     wears CPAP  . Allergy   . Pulmonary emboli     noted October 2014 - treated by Dr. Linda Hedges  . skin cancer     skin CA  . Clotting disorder     November 2014, no longer on blood thinngers   Past Surgical History  Procedure Laterality Date  . Cholecystectomy  1990    laproscopic   . Tkr  bilateral  2006    Dr. Violet Baldy  . Joint replacement      knee  . Prostate surgery      needle biopsy's, TURP  . Hernia repair    . Vasectomy    . Upper gastrointestinal endoscopy     Family History  Problem Relation Age of Onset  . Stroke Mother   . Hypertension Mother   . Heart disease Mother   . Heart disease Father     CAD/MI-fatal sudden death  . Colon cancer Father   . Cancer Sister     colon cancer 20090-survivor  . Colon cancer Sister   . Heart disease Paternal Uncle   . Colon cancer Paternal Uncle   . Heart disease Paternal Grandfather   . Colon cancer Paternal Aunt   . Stomach cancer Neg Hx   . Rectal cancer Neg Hx   . Esophageal cancer Neg Hx   . Colon cancer Cousin   . Colon cancer Cousin   . Colon cancer Paternal Uncle    History  Substance Use Topics  . Smoking status: Never Smoker   . Smokeless tobacco: Never Used  . Alcohol Use: 0.5 oz/week    1 drink(s) per week     Comment: social    Review  of Systems  Constitutional: Negative for fever, activity change and fatigue.  HENT: Negative.   Eyes: Negative.   Respiratory: Negative for cough and shortness of breath.   Cardiovascular: Negative for chest pain.  Musculoskeletal: Negative for back pain, neck pain and neck stiffness.  Skin: Positive for wound.  Neurological: Negative for dizziness, tremors, seizures, syncope, facial asymmetry, speech difficulty, light-headedness and headaches.  Psychiatric/Behavioral: Negative for behavioral problems, self-injury and agitation. The patient is nervous/anxious.     Allergies  Cephalexin and Levofloxacin  Home Medications   Prior to Admission medications   Medication Sig Start Date End Date Taking? Authorizing Provider  atenolol-chlorthalidone (TENORETIC) 50-25 MG per tablet TAKE ONE TABLET BY MOUTH EVERY DAY 01/09/14  Yes Neena Rhymes, MD  buPROPion (WELLBUTRIN XL) 150 MG 24 hr tablet Take 150 mg by mouth daily.   Yes Historical Provider, MD   Melatonin 1 MG/4ML LIQD Take by mouth at bedtime.   Yes Historical Provider, MD  Multiple Vitamin (MULTI-VITAMINS) TABS Take by mouth.   Yes Historical Provider, MD  fluticasone (FLONASE) 50 MCG/ACT nasal spray Place 1 spray into both nostrils daily. 10/15/13   Neena Rhymes, MD   BP 142/80 mmHg  Pulse 68  Temp(Src) 98.2 F (36.8 C) (Oral)  Resp 14  SpO2 94% Physical Exam  Constitutional: He is oriented to person, place, and time. He appears well-developed and well-nourished. No distress.  HENT:  Head: Normocephalic.  Mouth/Throat: Oropharynx is clear and moist.  Eyes: Conjunctivae and EOM are normal. Pupils are equal, round, and reactive to light.  Neck: Normal range of motion. Neck supple.  Cardiovascular: Normal rate and regular rhythm.   Pulmonary/Chest: Effort normal. No respiratory distress.  Musculoskeletal: Normal range of motion. He exhibits no edema.  Left hand with full range of motion, function, motor, sensory and neuro exam. Ambulation with a smooth balance gait. Palpation to the right posterior hip reveals mild tenderness. No percussion tenderness to the greater trochanter or lateral hip.  Lymphadenopathy:    He has no cervical adenopathy.  Neurological: He is alert and oriented to person, place, and time. No cranial nerve deficit. He exhibits normal muscle tone. Coordination normal.  Skin: Skin is warm and dry.  Psychiatric: He has a normal mood and affect. His speech is normal and behavior is normal. Judgment and thought content normal. Cognition and memory are normal.  Nursing note and vitals reviewed.   ED Course  LACERATION REPAIR Date/Time: 08/22/2014 2:50 PM Performed by: Marcha Dutton, Rhegan Trunnell Authorized by: Lynne Leader, S Consent: Verbal consent obtained. Risks and benefits: risks, benefits and alternatives were discussed Consent given by: patient Patient understanding: patient states understanding of the procedure being performed Patient identity confirmed:  verbally with patient Body area: upper extremity Location details: left hand Laceration length: 1 cm Foreign bodies: no foreign bodies Tendon involvement: none Nerve involvement: none Vascular damage: no Patient sedated: no Irrigation solution: saline Irrigation method: jet lavage Amount of cleaning: standard Debridement: none Degree of undermining: none Skin closure: glue Technique: simple Approximation: close Approximation difficulty: simple Patient tolerance: Patient tolerated the procedure well with no immediate complications   (including critical care time) Labs Review Labs Reviewed - No data to display  Imaging Review No results found.   MDM   1. Hand laceration, left, initial encounter   2. Fall (on)(from) incline, initial encounter   3. Stress at work    Wound care instructions.  Fall precautions Return for problems, Call your PCP for problems or concerns  about work stress and memory.    Janne Napoleon, NP 08/22/14 1452

## 2014-08-22 NOTE — Discharge Instructions (Signed)
Fall Prevention and Home Safety Falls cause injuries and can affect all age groups. It is possible to use preventive measures to significantly decrease the likelihood of falls. There are many simple measures which can make your home safer and prevent falls. OUTDOORS  Repair cracks and edges of walkways and driveways.  Remove high doorway thresholds.  Trim shrubbery on the main path into your home.  Have good outside lighting.  Clear walkways of tools, rocks, debris, and clutter.  Check that handrails are not broken and are securely fastened. Both sides of steps should have handrails.  Have leaves, snow, and ice cleared regularly.  Use sand or salt on walkways during winter months.  In the garage, clean up grease or oil spills. BATHROOM  Install night lights.  Install grab bars by the toilet and in the tub and shower.  Use non-skid mats or decals in the tub or shower.  Place a plastic non-slip stool in the shower to sit on, if needed.  Keep floors dry and clean up all water on the floor immediately.  Remove soap buildup in the tub or shower on a regular basis.  Secure bath mats with non-slip, double-sided rug tape.  Remove throw rugs and tripping hazards from the floors. BEDROOMS  Install night lights.  Make sure a bedside light is easy to reach.  Do not use oversized bedding.  Keep a telephone by your bedside.  Have a firm chair with side arms to use for getting dressed.  Remove throw rugs and tripping hazards from the floor. KITCHEN  Keep handles on pots and pans turned toward the center of the stove. Use back burners when possible.  Clean up spills quickly and allow time for drying.  Avoid walking on wet floors.  Avoid hot utensils and knives.  Position shelves so they are not too high or low.  Place commonly used objects within easy reach.  If necessary, use a sturdy step stool with a grab bar when reaching.  Keep electrical cables out of the  way.  Do not use floor polish or wax that makes floors slippery. If you must use wax, use non-skid floor wax.  Remove throw rugs and tripping hazards from the floor. STAIRWAYS  Never leave objects on stairs.  Place handrails on both sides of stairways and use them. Fix any loose handrails. Make sure handrails on both sides of the stairways are as long as the stairs.  Check carpeting to make sure it is firmly attached along stairs. Make repairs to worn or loose carpet promptly.  Avoid placing throw rugs at the top or bottom of stairways, or properly secure the rug with carpet tape to prevent slippage. Get rid of throw rugs, if possible.  Have an electrician put in a light switch at the top and bottom of the stairs. OTHER FALL PREVENTION TIPS  Wear low-heel or rubber-soled shoes that are supportive and fit well. Wear closed toe shoes.  When using a stepladder, make sure it is fully opened and both spreaders are firmly locked. Do not climb a closed stepladder.  Add color or contrast paint or tape to grab bars and handrails in your home. Place contrasting color strips on first and last steps.  Learn and use mobility aids as needed. Install an electrical emergency response system.  Turn on lights to avoid dark areas. Replace light bulbs that burn out immediately. Get light switches that glow.  Arrange furniture to create clear pathways. Keep furniture in the same place.  Firmly attach carpet with non-skid or double-sided tape.  Eliminate uneven floor surfaces.  Select a carpet pattern that does not visually hide the edge of steps.  Be aware of all pets. OTHER HOME SAFETY TIPS  Set the water temperature for 120 F (48.8 C).  Keep emergency numbers on or near the telephone.  Keep smoke detectors on every level of the home and near sleeping areas. Document Released: 08/12/2002 Document Revised: 02/21/2012 Document Reviewed: 11/11/2011 [facility] Patient Information 2015  [place], [place]. This information is not intended to replace advice given to you by your health care provider. Make sure you discuss any questions you have with your health care provider.  Laceration Care, Adult A laceration is a cut or lesion that goes through all layers of the skin and into the tissue just beneath the skin. TREATMENT  Some lacerations may not require closure. Some lacerations may not be able to be closed due to an increased risk of infection. It is important to see your caregiver as soon as possible after an injury to minimize the risk of infection and maximize the opportunity for successful closure. If closure is appropriate, pain medicines may be given, if needed. The wound will be cleaned to help prevent infection. Your caregiver will use stitches (sutures), staples, wound glue (adhesive), or skin adhesive strips to repair the laceration. These tools bring the skin edges together to allow for faster healing and a better cosmetic outcome. However, all wounds will heal with a scar. Once the wound has healed, scarring can be minimized by covering the wound with sunscreen during the day for 1 full year. HOME CARE INSTRUCTIONS  For sutures or staples:  Keep the wound clean and dry.  If you were given a bandage (dressing), you should change it at least once a day. Also, change the dressing if it becomes wet or dirty, or as directed by your caregiver.  Fraser Din the wound dry with a clean towel.    You may shower as usual after the first 24 hours. Do not soak the wound in water until the sutures are removed.  Only take over-the-counter or prescription medicines for pain, discomfort, or fever as directed by your caregiver.  Get your sutures or staples removed as directed by your caregiver. For skin adhesive strips:  Keep the wound clean and dry.  Do not get the skin adhesive strips wet. You may bathe carefully, using caution to keep the wound dry.  If the wound gets wet, pat it  dry with a clean towel.  Skin adhesive strips will fall off on their own. You may trim the strips as the wound heals. Do not remove skin adhesive strips that are still stuck to the wound. They will fall off in time. For wound adhesive:  You may briefly wet your wound in the shower or bath. Do not soak or scrub the wound. Do not swim. Avoid periods of heavy perspiration until the skin adhesive has fallen off on its own. After showering or bathing, gently pat the wound dry with a clean towel.  Do not apply liquid medicine, cream medicine, or ointment medicine to your wound while the skin adhesive is in place. This may loosen the film before your wound is healed.  If a dressing is placed over the wound, be careful not to apply tape directly over the skin adhesive. This may cause the adhesive to be pulled off before the wound is healed.  Avoid prolonged exposure to sunlight or tanning lamps  while the skin adhesive is in place. Exposure to ultraviolet light in the first year will darken the scar.  The skin adhesive will usually remain in place for 5 to 10 days, then naturally fall off the skin. Do not pick at the adhesive film. You may need a tetanus shot if:  You cannot remember when you had your last tetanus shot.  You have never had a tetanus shot. If you get a tetanus shot, your arm may swell, get red, and feel warm to the touch. This is common and not a problem. If you need a tetanus shot and you choose not to have one, there is a rare chance of getting tetanus. Sickness from tetanus can be serious. SEEK MEDICAL CARE IF:   You have redness, swelling, or increasing pain in the wound.  You see a red line that goes away from the wound.  You have yellowish-white fluid (pus) coming from the wound.  You have a fever.  You notice a bad smell coming from the wound or dressing.  Your wound breaks open before or after sutures have been removed.  You notice something coming out of the wound  such as wood or glass.  Your wound is on your hand or foot and you cannot move a finger or toe. SEEK IMMEDIATE MEDICAL CARE IF:   Your pain is not controlled with prescribed medicine.  You have severe swelling around the wound causing pain and numbness or a change in color in your arm, hand, leg, or foot.  Your wound splits open and starts bleeding.  You have worsening numbness, weakness, or loss of function of any joint around or beyond the wound.  You develop painful lumps near the wound or on the skin anywhere on your body. MAKE SURE YOU:   Understand these instructions.  Will watch your condition.  Will get help right away if you are not doing well or get worse. Document Released: 08/22/2005 Document Revised: 11/14/2011 Document Reviewed: 02/15/2011 [facility] Patient Information 2015 [place], [place]. This information is not intended to replace advice given to you by your health care provider. Make sure you discuss any questions you have with your health care provider.

## 2014-09-18 ENCOUNTER — Ambulatory Visit (INDEPENDENT_AMBULATORY_CARE_PROVIDER_SITE_OTHER): Payer: Medicare Other | Admitting: Internal Medicine

## 2014-09-18 ENCOUNTER — Other Ambulatory Visit (INDEPENDENT_AMBULATORY_CARE_PROVIDER_SITE_OTHER): Payer: Medicare Other

## 2014-09-18 ENCOUNTER — Encounter: Payer: Self-pay | Admitting: Internal Medicine

## 2014-09-18 VITALS — BP 130/64 | HR 50 | Temp 97.7°F | Resp 16 | Ht 75.0 in | Wt 239.8 lb

## 2014-09-18 DIAGNOSIS — Z0000 Encounter for general adult medical examination without abnormal findings: Secondary | ICD-10-CM

## 2014-09-18 DIAGNOSIS — F32A Depression, unspecified: Secondary | ICD-10-CM

## 2014-09-18 DIAGNOSIS — F329 Major depressive disorder, single episode, unspecified: Secondary | ICD-10-CM

## 2014-09-18 DIAGNOSIS — M1990 Unspecified osteoarthritis, unspecified site: Secondary | ICD-10-CM

## 2014-09-18 DIAGNOSIS — E785 Hyperlipidemia, unspecified: Secondary | ICD-10-CM

## 2014-09-18 DIAGNOSIS — J3089 Other allergic rhinitis: Secondary | ICD-10-CM

## 2014-09-18 DIAGNOSIS — I10 Essential (primary) hypertension: Secondary | ICD-10-CM

## 2014-09-18 LAB — LIPID PANEL
CHOLESTEROL: 162 mg/dL (ref 0–200)
HDL: 31.9 mg/dL — ABNORMAL LOW (ref >39.00)
LDL Cholesterol: 96 mg/dL (ref 0–99)
NONHDL: 130.1
Total CHOL/HDL Ratio: 5
Triglycerides: 169 mg/dL — ABNORMAL HIGH (ref 0.0–149.0)
VLDL: 33.8 mg/dL (ref 0.0–40.0)

## 2014-09-18 LAB — COMPREHENSIVE METABOLIC PANEL
ALBUMIN: 4.3 g/dL (ref 3.5–5.2)
ALK PHOS: 70 U/L (ref 39–117)
ALT: 24 U/L (ref 0–53)
AST: 24 U/L (ref 0–37)
BILIRUBIN TOTAL: 0.8 mg/dL (ref 0.2–1.2)
BUN: 12 mg/dL (ref 6–23)
CO2: 33 mEq/L — ABNORMAL HIGH (ref 19–32)
CREATININE: 0.93 mg/dL (ref 0.40–1.50)
Calcium: 9.8 mg/dL (ref 8.4–10.5)
Chloride: 102 mEq/L (ref 96–112)
GFR: 85.75 mL/min (ref >60.00)
GLUCOSE: 110 mg/dL — AB (ref 70–99)
POTASSIUM: 4.2 meq/L (ref 3.5–5.1)
Sodium: 139 mEq/L (ref 135–145)
TOTAL PROTEIN: 7.5 g/dL (ref 6.0–8.3)

## 2014-09-18 MED ORDER — BUDESONIDE 32 MCG/ACT NA SUSP: 2.0000 | Freq: Every day | NASAL | Status: DC

## 2014-09-18 MED ORDER — ATENOLOL-CHLORTHALIDONE 50-25 MG PO TABS: ORAL_TABLET | ORAL | Status: DC

## 2014-09-18 NOTE — Patient Instructions (Signed)
We are going to check your blood today and check on the liver and the cholesterol and kidneys.   You are doing well and your blood pressure is good today. Keep working on exercising about 3 times per week by walking or doing another activity.   Health Maintenance A healthy lifestyle and preventative care can promote health and wellness.  Maintain regular health, dental, and eye exams.  Eat a healthy diet. Foods like vegetables, fruits, whole grains, low-fat dairy products, and lean protein foods contain the nutrients you need and are low in calories. Decrease your intake of foods high in solid fats, added sugars, and salt. Get information about a proper diet from your health care provider, if necessary.  Regular physical exercise is one of the most important things you can do for your health. Most adults should get at least 150 minutes of moderate-intensity exercise (any activity that increases your heart rate and causes you to sweat) each week. In addition, most adults need muscle-strengthening exercises on 2 or more days a week.   Maintain a healthy weight. The body mass index (BMI) is a screening tool to identify possible weight problems. It provides an estimate of body fat based on height and weight. Your health care provider can find your BMI and can help you achieve or maintain a healthy weight. For males 20 years and older:  A BMI below 18.5 is considered underweight.  A BMI of 18.5 to 24.9 is normal.  A BMI of 25 to 29.9 is considered overweight.  A BMI of 30 and above is considered obese.  Maintain normal blood lipids and cholesterol by exercising and minimizing your intake of saturated fat. Eat a balanced diet with plenty of fruits and vegetables. Blood tests for lipids and cholesterol should begin at age 72 and be repeated every 5 years. If your lipid or cholesterol levels are high, you are over age 71, or you are at high risk for heart disease, you may need your cholesterol levels  checked more frequently.Ongoing high lipid and cholesterol levels should be treated with medicines if diet and exercise are not working.  If you smoke, find out from your health care provider how to quit. If you do not use tobacco, do not start.  Lung cancer screening is recommended for adults aged 58-80 years who are at high risk for developing lung cancer because of a history of smoking. A yearly low-dose CT scan of the lungs is recommended for people who have at least a 30-pack-year history of smoking and are current smokers or have quit within the past 15 years. A pack year of smoking is smoking an average of 1 pack of cigarettes a day for 1 year (for example, a 30-pack-year history of smoking could mean smoking 1 pack a day for 30 years or 2 packs a day for 15 years). Yearly screening should continue until the smoker has stopped smoking for at least 15 years. Yearly screening should be stopped for people who develop a health problem that would prevent them from having lung cancer treatment.  If you choose to drink alcohol, do not have more than 2 drinks per day. One drink is considered to be 12 oz (360 mL) of beer, 5 oz (150 mL) of wine, or 1.5 oz (45 mL) of liquor.  Avoid the use of street drugs. Do not share needles with anyone. Ask for help if you need support or instructions about stopping the use of drugs.  High blood pressure causes  heart disease and increases the risk of stroke. Blood pressure should be checked at least every 1-2 years. Ongoing high blood pressure should be treated with medicines if weight loss and exercise are not effective.  If you are 74-85 years old, ask your health care provider if you should take aspirin to prevent heart disease.  Diabetes screening involves taking a blood sample to check your fasting blood sugar level. This should be done once every 3 years after age 60 if you are at a normal weight and without risk factors for diabetes. Testing should be considered  at a younger age or be carried out more frequently if you are overweight and have at least 1 risk factor for diabetes.  Colorectal cancer can be detected and often prevented. Most routine colorectal cancer screening begins at the age of 81 and continues through age 63. However, your health care provider may recommend screening at an earlier age if you have risk factors for colon cancer. On a yearly basis, your health care provider may provide home test kits to check for hidden blood in the stool. A small camera at the end of a tube may be used to directly examine the colon (sigmoidoscopy or colonoscopy) to detect the earliest forms of colorectal cancer. Talk to your health care provider about this at age 60 when routine screening begins. A direct exam of the colon should be repeated every 5-10 years through age 30, unless early forms of precancerous polyps or small growths are found.  People who are at an increased risk for hepatitis B should be screened for this virus. You are considered at high risk for hepatitis B if:  You were born in a country where hepatitis B occurs often. Talk with your health care provider about which countries are considered high risk.  Your parents were born in a high-risk country and you have not received a shot to protect against hepatitis B (hepatitis B vaccine).  You have HIV or AIDS.  You use needles to inject street drugs.  You live with, or have sex with, someone who has hepatitis B.  You are a man who has sex with other men (MSM).  You get hemodialysis treatment.  You take certain medicines for conditions like cancer, organ transplantation, and autoimmune conditions.  Hepatitis C blood testing is recommended for all people born from [birth year] through 1965 and any individual with known risk factors for hepatitis C.  Healthy men should no longer receive prostate-specific antigen (PSA) blood tests as part of routine cancer screening. Talk to your health care  provider about prostate cancer screening.  Testicular cancer screening is not recommended for adolescents or adult males who have no symptoms. Screening includes self-exam, a health care provider exam, and other screening tests. Consult with your health care provider about any symptoms you have or any concerns you have about testicular cancer.  Practice safe sex. Use condoms and avoid high-risk sexual practices to reduce the spread of sexually transmitted infections (STIs).  You should be screened for STIs, including gonorrhea and chlamydia if:  You are sexually active and are younger than 24 years.  You are older than 24 years, and your health care provider tells you that you are at risk for this type of infection.  Your sexual activity has changed since you were last screened, and you are at an increased risk for chlamydia or gonorrhea. Ask your health care provider if you are at risk.  If you are at risk of being  infected with HIV, it is recommended that you take a prescription medicine daily to prevent HIV infection. This is called pre-exposure prophylaxis (PrEP). You are considered at risk if:  You are a man who has sex with other men (MSM).  You are a heterosexual man who is sexually active with multiple partners.  You take drugs by injection.  You are sexually active with a partner who has HIV.  Talk with your health care provider about whether you are at high risk of being infected with HIV. If you choose to begin PrEP, you should first be tested for HIV. You should then be tested every 3 months for as long as you are taking PrEP.  Use sunscreen. Apply sunscreen liberally and repeatedly throughout the day. You should seek shade when your shadow is shorter than you. Protect yourself by wearing long sleeves, pants, a wide-brimmed hat, and sunglasses year round whenever you are outdoors.  Tell your health care provider of new moles or changes in moles, especially if there is a change  in shape or color. Also, tell your health care provider if a mole is larger than the size of a pencil eraser.  A one-time screening for abdominal aortic aneurysm (AAA) and surgical repair of large AAAs by ultrasound is recommended for men aged 37-75 years who are current or former smokers.  Stay current with your vaccines (immunizations). Document Released: 02/18/2008 Document Revised: 08/27/2013 Document Reviewed: 01/17/2011 [facility] Patient Information 2015 [place], [place]. This information is not intended to replace advice given to you by your health care provider. Make sure you discuss any questions you have with your health care provider.

## 2014-09-18 NOTE — Progress Notes (Signed)
Pre visit review using our clinic review tool, if applicable. No additional management support is needed unless otherwise documented below in the visit note. 

## 2014-09-21 NOTE — Progress Notes (Signed)
   Subjective:    Patient ID: John Blackburn, male    DOB: 09-03-1946, 69 y.o.   MRN: [id number]  HPI The patient is a 69 YO man who is coming in today to establish care. He has PMH of depression, HTN, allergic rhinitis. He is doing well and does not have any new problems. He tries to exercise and has no chest pains. He denies SOB or abdominal pains. He does have mild arthritis and does not need any OTC meds at this time for pain. Denies any change in vision or hearing and no problems with his ADLs.   Review of Systems  Constitutional: Negative for fever, activity change, appetite change, fatigue and unexpected weight change.  HENT: Negative for congestion.   Respiratory: Negative for cough, chest tightness, shortness of breath and wheezing.   Cardiovascular: Negative for chest pain, palpitations and leg swelling.  Gastrointestinal: Negative for abdominal pain, diarrhea, constipation and abdominal distention.  Musculoskeletal: Negative.   Neurological: Negative.   Psychiatric/Behavioral: Negative.       Objective:   Physical Exam  Constitutional: He is oriented to person, place, and time. He appears well-developed and well-nourished.  HENT:  Head: Normocephalic and atraumatic.  Eyes: EOM are normal.  Neck: Normal range of motion.  Cardiovascular: Normal rate and regular rhythm.   Pulmonary/Chest: Effort normal and breath sounds normal. No respiratory distress. He has no wheezes. He has no rales. He exhibits no tenderness.  Abdominal: Soft. Bowel sounds are normal. He exhibits no distension. There is no tenderness. There is no rebound.  Musculoskeletal: He exhibits no edema.  Neurological: He is alert and oriented to person, place, and time. Coordination normal.  Skin: Skin is warm and dry.   Filed Vitals:   09/18/14 1526  BP: 130/64  Pulse: 50  Temp: 97.7 F (36.5 C)  TempSrc: Oral  Resp: 16  Height: 6\' 3"  (1.905 m)  Weight: 239 lb 12.8 oz (108.773 kg)  SpO2: 95%        Assessment & Plan:

## 2014-09-21 NOTE — Assessment & Plan Note (Signed)
Doing well with rhinocort.

## 2014-09-21 NOTE — Assessment & Plan Note (Signed)
BP well controlled today, check CMP and make changes as needed.

## 2014-09-21 NOTE — Assessment & Plan Note (Signed)
Doing well on wellbutrin for now.

## 2014-09-21 NOTE — Assessment & Plan Note (Signed)
Currently not needing medication.

## 2014-09-29 ENCOUNTER — Telehealth: Payer: Self-pay | Admitting: Internal Medicine

## 2014-09-29 NOTE — Telephone Encounter (Signed)
Notified pt with md response.../lmb 

## 2014-09-29 NOTE — Telephone Encounter (Signed)
Patient called stating he would like to speak with someone regarding his history of blood clots and the possibility of taking a blood thinner. CB# [phone]

## 2014-09-29 NOTE — Telephone Encounter (Signed)
Patient stated he is having ? ulnar compression on (L) arm...John Blackburn

## 2014-09-29 NOTE — Telephone Encounter (Signed)
There is no need to check for blood clots before surgery but depending on the kind of surgery there may be a need for additional medicine after surgery to prevent clot. What kind of surgery is he having?

## 2014-09-29 NOTE — Telephone Encounter (Signed)
Called pt he states that he is schedule to have surgery on his (L) arm tomorrow. He stated a year ago he had blood clots in his leg & lungs. Use to have his coumadin check. Pt is wanting to know is there anything he need to do to check if he has any blood clots prior to surgery...John Blackburn

## 2014-11-27 ENCOUNTER — Ambulatory Visit
Admission: RE | Admit: 2014-11-27 | Discharge: 2014-11-27 | Disposition: A | Discharge status: Home / Self Care | Payer: Medicare Other | Source: Ambulatory Visit | Attending: Sports Medicine | Admitting: Sports Medicine

## 2014-11-27 ENCOUNTER — Ambulatory Visit (INDEPENDENT_AMBULATORY_CARE_PROVIDER_SITE_OTHER): Payer: Medicare Other | Admitting: Sports Medicine

## 2014-11-27 ENCOUNTER — Encounter: Payer: Self-pay | Admitting: Sports Medicine

## 2014-11-27 VITALS — BP 125/64 | HR 60 | Ht 75.0 in | Wt 240.0 lb

## 2014-11-27 DIAGNOSIS — M25551 Pain in right hip: Secondary | ICD-10-CM

## 2014-11-27 DIAGNOSIS — M25559 Pain in unspecified hip: Secondary | ICD-10-CM | POA: Insufficient documentation

## 2014-11-27 IMAGING — CR DG HIP (WITH OR WITHOUT PELVIS) 2-3V*R*
2 series · 2 of 2 positions shown · non-contrast
Comparison: None

CLINICAL DATA: RIGHT hip pain for several weeks, no trauma

EXAM:
RIGHT HIP (WITH PELVIS) 2-3 VIEWS

[w pelvis (1 of 2)]
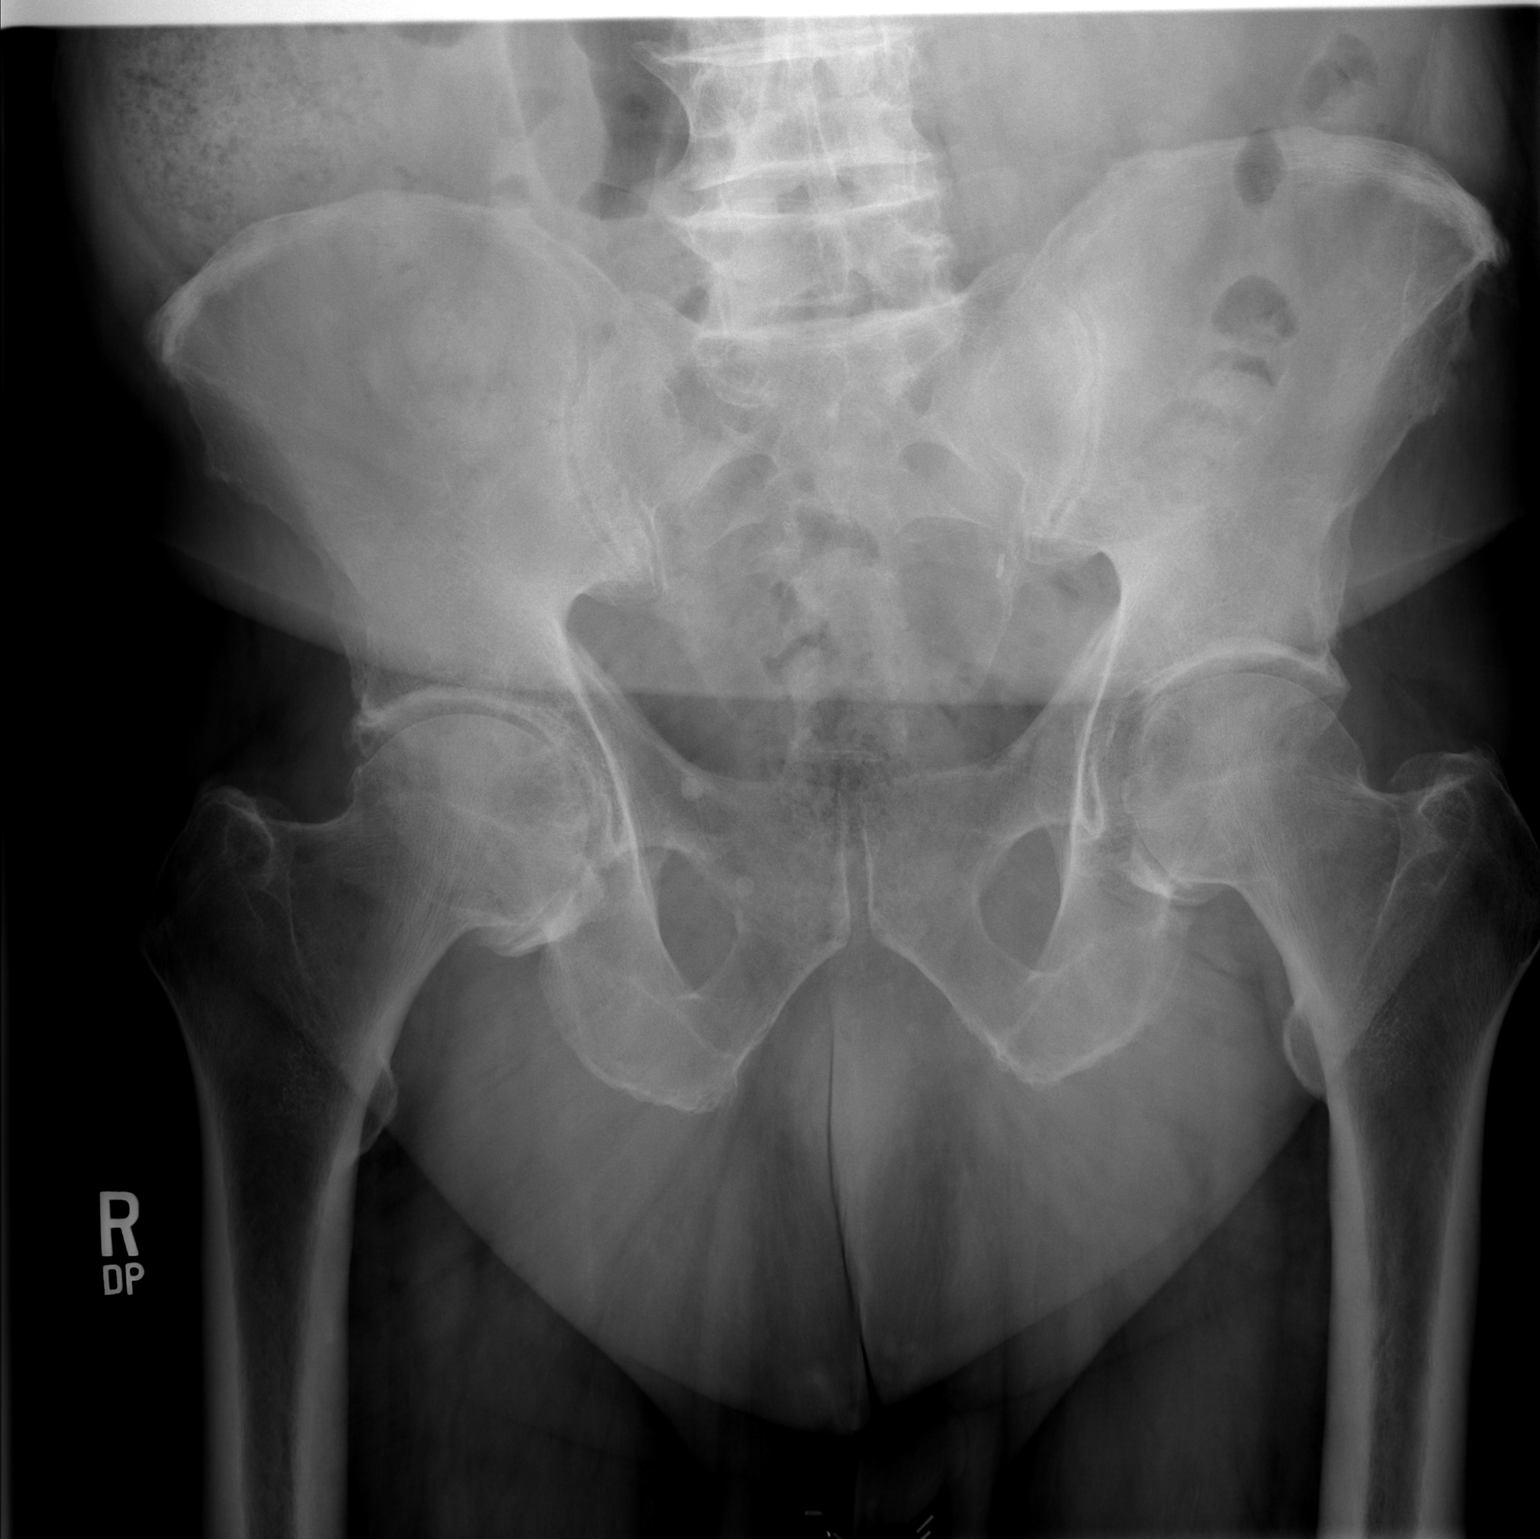

[w pelvis (2 of 2)]
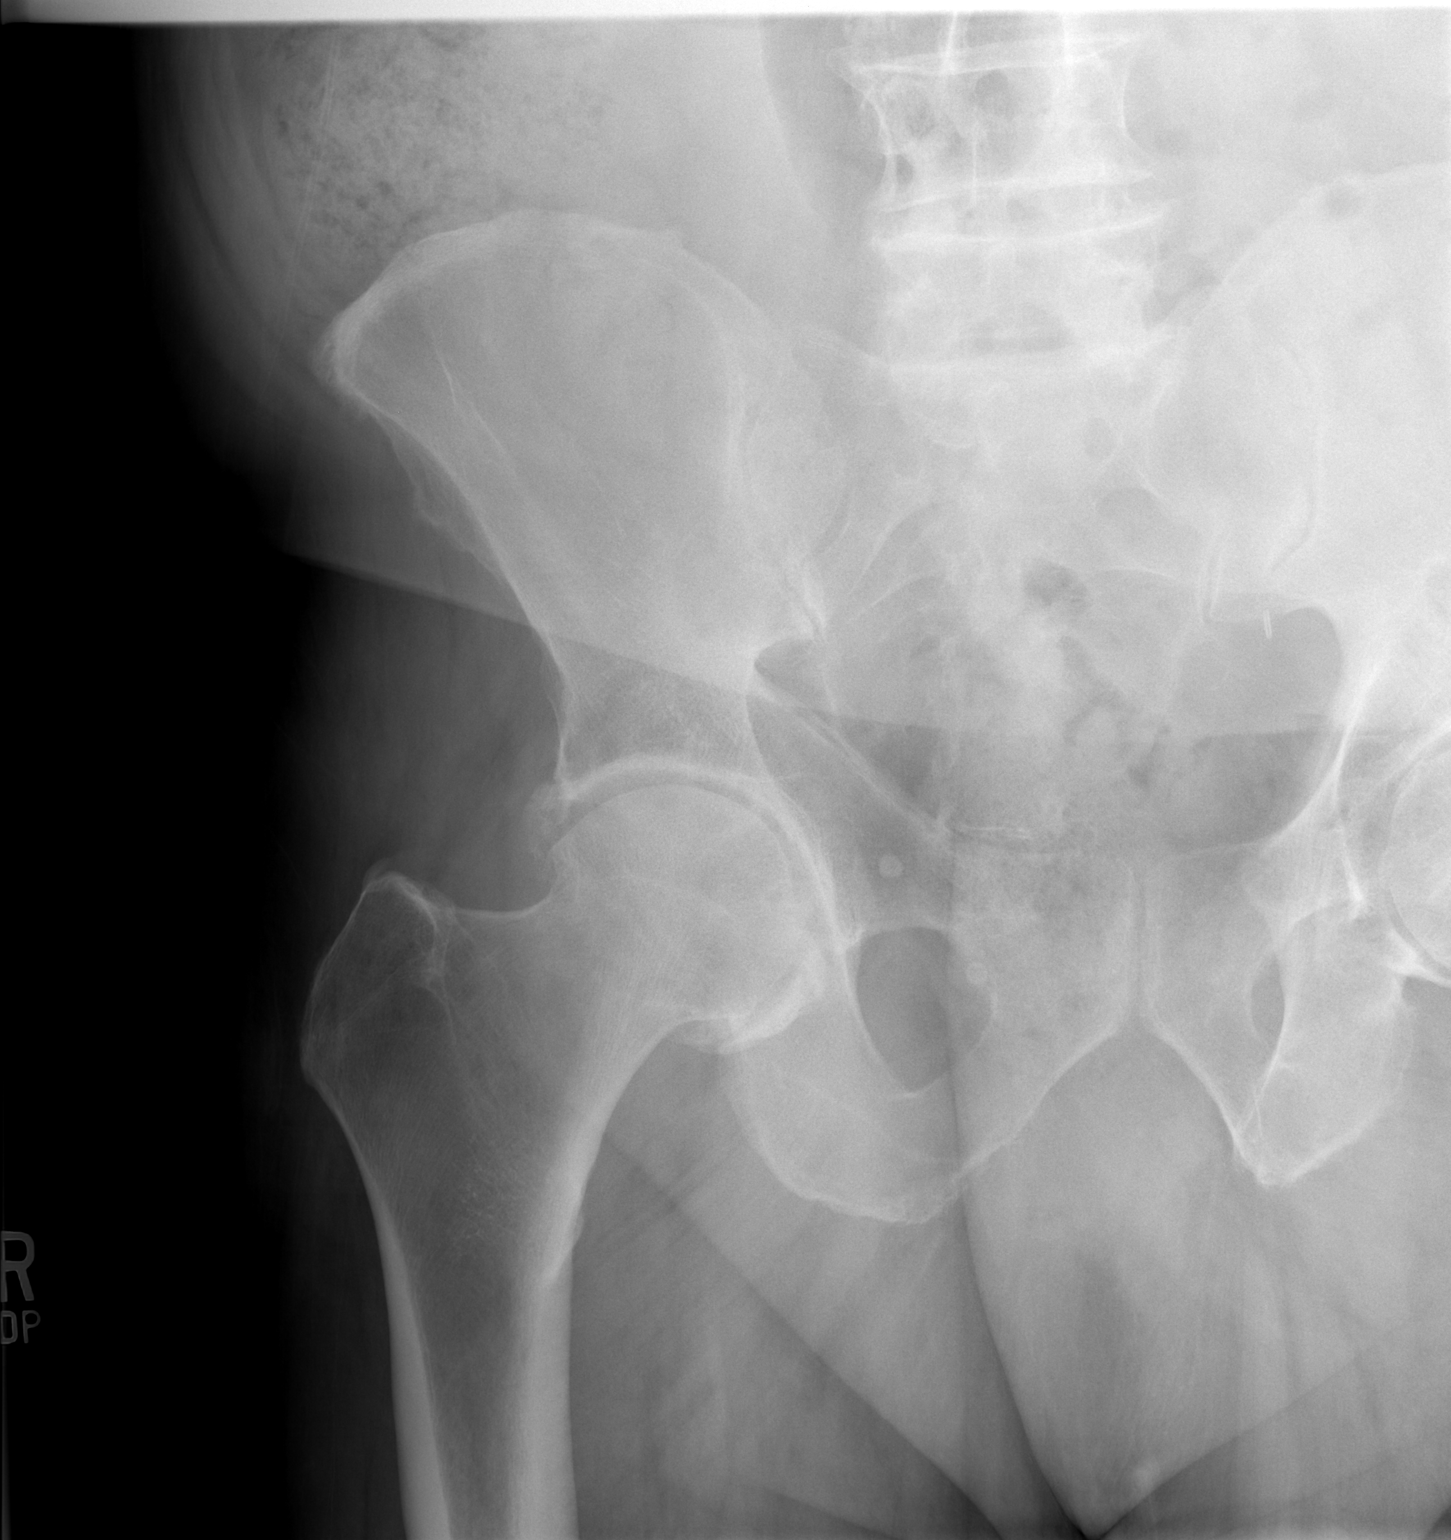

[2 of 2 positions shown; findings below may reference images not displayed]

FINDINGS: Osseous demineralization.

Degenerative changes of both hips slightly greater on RIGHT with
joint space narrowing and spur formation.

SI joints symmetric.

No acute fracture, dislocation or bone destruction.

Mild degenerative disc disease changes at lower lumbar spine.

Few pelvic phleboliths.
IMPRESSION: Degenerative changes of both hips slightly greater on RIGHT.

## 2014-11-27 IMAGING — CR DG HIP (WITH OR WITHOUT PELVIS) 2-3V*R*
1 series · 1 of 1 positions shown · non-contrast
Comparison: None

CLINICAL DATA: RIGHT hip pain for several weeks, no trauma

EXAM:
RIGHT HIP (WITH PELVIS) 2-3 VIEWS

[t hip frog leg right]
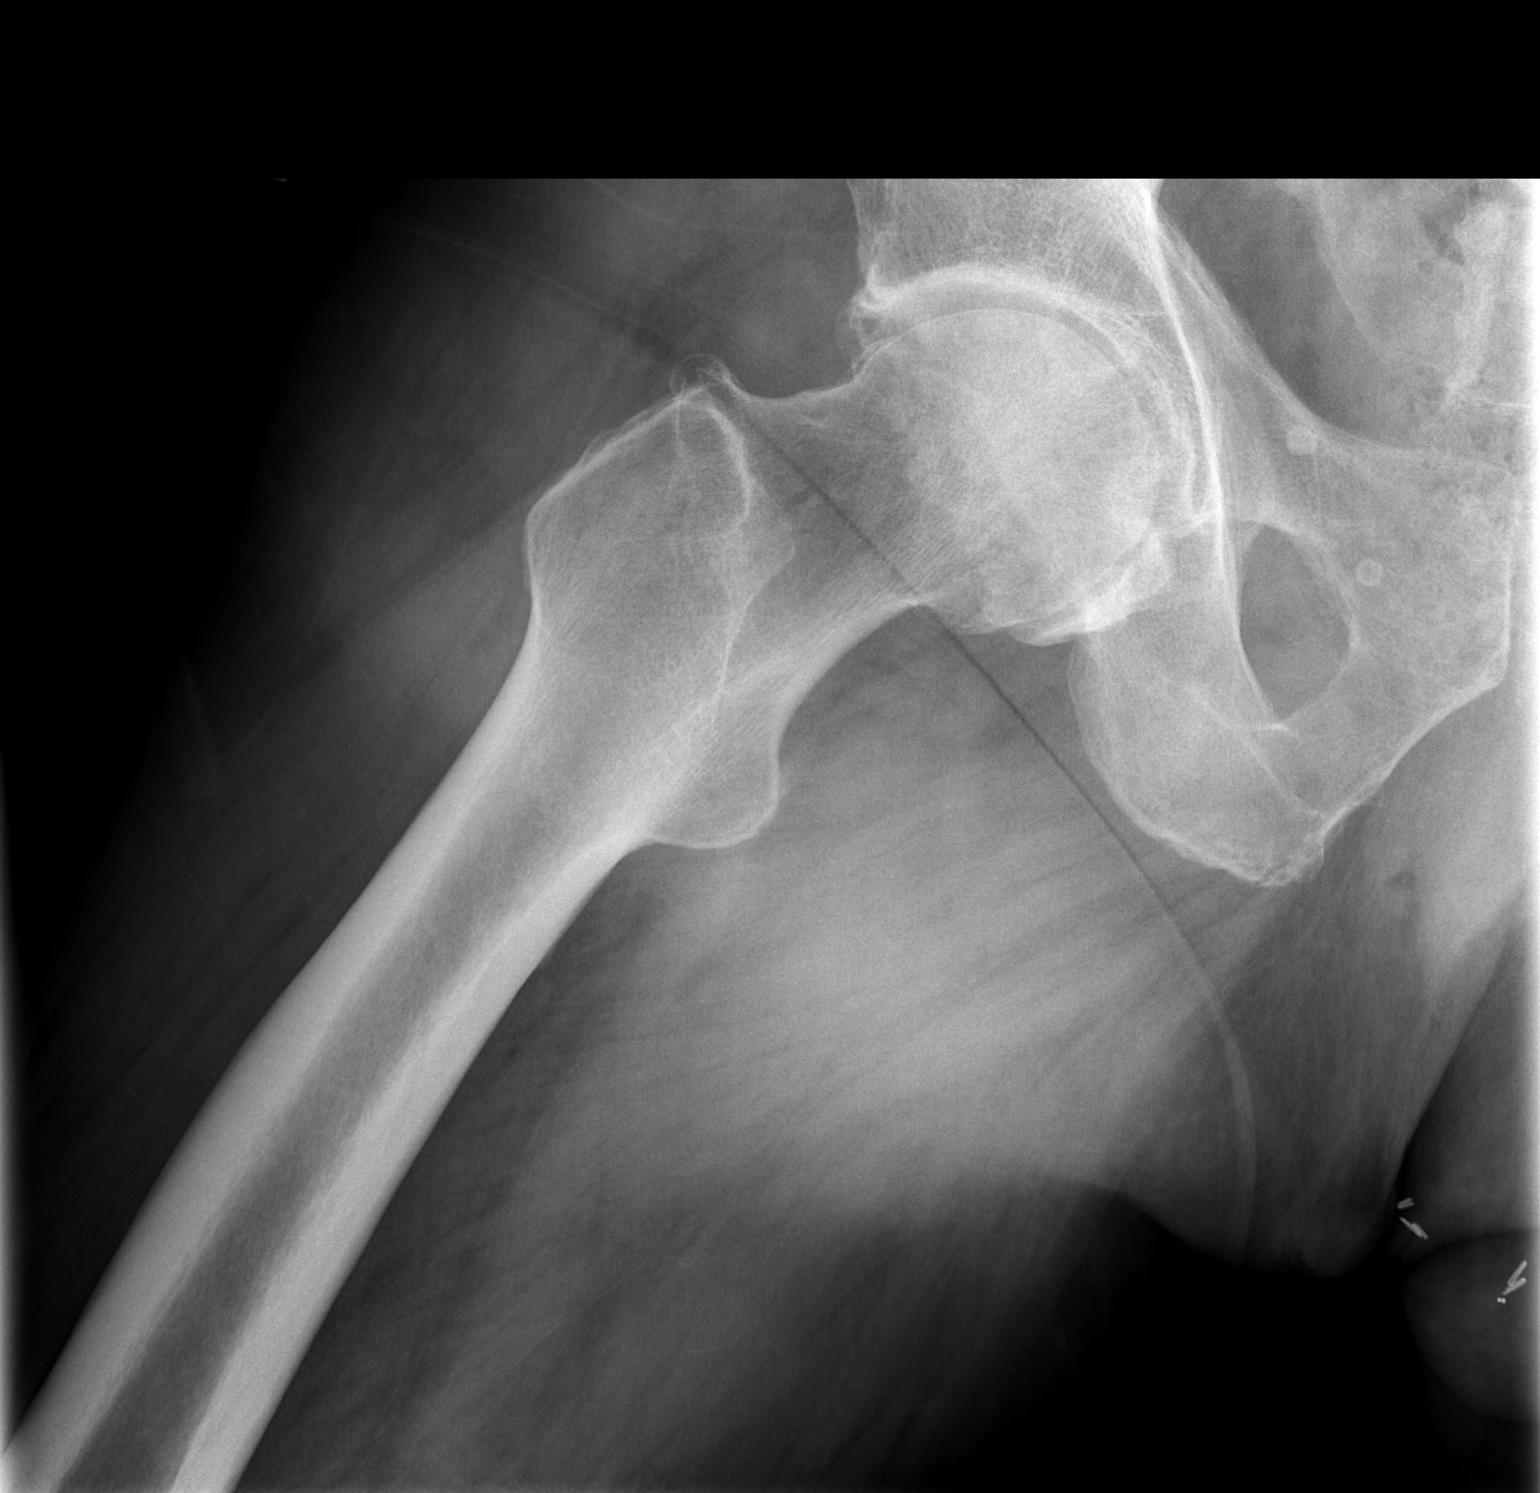

[1 of 1 positions shown; findings below may reference images not displayed]

FINDINGS: Osseous demineralization.

Degenerative changes of both hips slightly greater on RIGHT with
joint space narrowing and spur formation.

SI joints symmetric.

No acute fracture, dislocation or bone destruction.

Mild degenerative disc disease changes at lower lumbar spine.

Few pelvic phleboliths.
IMPRESSION: Degenerative changes of both hips slightly greater on RIGHT.

## 2014-11-27 NOTE — Patient Instructions (Signed)
2 ibuprofen(200mg )  2-3 Times per day for 1 week 2 exercises I went over with you.

## 2014-11-27 NOTE — Progress Notes (Signed)
  John Blackburn - 69 y.o. male MRN [id number]  Date of birth: [birth year]  SUBJECTIVE: CC: 1.  right diffuse hip pain, initial evaluation      HPI:  Patient reports insidious onset of right hip pain during his daily 1 mile walk approximately 2 weeks ago.  He took a week off of walking and when he returned earlier this week had persistent pain.   He's tried taking occasional ibuprofen without significant relief.  Pain is mainly located over the right gluteal region, no radicular component  Does not bother him during the day and does not keep him up at night  No fevers, chills recently gain or weight loss.   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  Pertinent Historical Findings include:  reports that he has never smoked. He has never used smokeless tobacco. 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 No acute distress PSYCH: Alert and appropriately interactive. SKIN: No open skin lesions or abnormal skin markings on areas inspected as below VASCULAR: no significant lower extremity edema NEURO: Incision is intact to light touch in bilateral lower extremity dermatomes, bilateral lower extremity strength 5/5 in  L2-S1 myotomes.  RIGHT HIP:  10 of internal rotation, pain with axial loading localizing to groin. Positive FADIR, positive logroll, negative FABER. Hip abduction strength 4+/5 left, 4/5. Mild TTP over insertion of gluteus medius & greater trochanter.   ASSESSMENT: 1. Hip pain, right    Problem  Hip Pain   Suspect underlying degenerative changes History and exam Consistent with glute medius tendinopathy, patient declined injection    PLAN: See problem based charting & AVS for additional documentation.  HEP: Hip abduction exercises side lying, lateral step ups, cautioned on fall risk  Ibuprofen OTC per patient request  X-ray of right hip, will call with results, suspect moderate to severe degenerative changes > Consider: Intra-articular versus greater trochanteric injection if not improving > Return in about 6 weeks (around 01/08/2015) for clinical recheck.

## 2014-12-03 ENCOUNTER — Telehealth: Payer: Self-pay | Admitting: Sports Medicine

## 2014-12-03 MED ORDER — NAPROXEN 500 MG PO TABS: ORAL_TABLET | ORAL | Status: DC

## 2014-12-03 NOTE — Telephone Encounter (Signed)
Call to discuss with the patient the results of his x-ray. We discussed he does have arthritis in his hip and we can consider medications or potential injection. We will try 2 weeks of prescription naproxen which have called into his pharmacy today. Patient agreeable with this plan and he will follow-up in 6 weeks if he is overall improving otherwise we will plan to see him back sooner for consideration of intra-articular hip injection.

## 2015-03-02 ENCOUNTER — Other Ambulatory Visit: Payer: Self-pay

## 2015-04-08 ENCOUNTER — Encounter: Payer: Self-pay | Admitting: Internal Medicine

## 2015-04-08 ENCOUNTER — Ambulatory Visit (INDEPENDENT_AMBULATORY_CARE_PROVIDER_SITE_OTHER): Payer: Medicare Other | Admitting: Internal Medicine

## 2015-04-08 ENCOUNTER — Ambulatory Visit (INDEPENDENT_AMBULATORY_CARE_PROVIDER_SITE_OTHER)
Admission: RE | Admit: 2015-04-08 | Discharge: 2015-04-08 | Disposition: A | Discharge status: Home / Self Care | Payer: Medicare Other | Source: Ambulatory Visit | Attending: Internal Medicine | Admitting: Internal Medicine

## 2015-04-08 ENCOUNTER — Other Ambulatory Visit (INDEPENDENT_AMBULATORY_CARE_PROVIDER_SITE_OTHER): Payer: Medicare Other

## 2015-04-08 VITALS — BP 126/64 | HR 70 | Temp 98.3°F | Resp 16 | Wt 244.0 lb

## 2015-04-08 DIAGNOSIS — R739 Hyperglycemia, unspecified: Secondary | ICD-10-CM | POA: Diagnosis not present

## 2015-04-08 DIAGNOSIS — R609 Edema, unspecified: Secondary | ICD-10-CM

## 2015-04-08 DIAGNOSIS — J3089 Other allergic rhinitis: Secondary | ICD-10-CM | POA: Diagnosis not present

## 2015-04-08 DIAGNOSIS — G4731 Primary central sleep apnea: Secondary | ICD-10-CM

## 2015-04-08 LAB — BASIC METABOLIC PANEL
BUN: 9 mg/dL (ref 6–23)
CHLORIDE: 104 meq/L (ref 96–112)
CO2: 32 mEq/L (ref 19–32)
Calcium: 9.3 mg/dL (ref 8.4–10.5)
Creatinine, Ser: 0.75 mg/dL (ref 0.40–1.50)
GFR: 109.73 mL/min (ref >60.00)
Glucose, Bld: 92 mg/dL (ref 70–99)
POTASSIUM: 3.8 meq/L (ref 3.5–5.1)
SODIUM: 141 meq/L (ref 135–145)

## 2015-04-08 LAB — HEMOGLOBIN A1C: Hgb A1c MFr Bld: 5.3 % (ref 4.6–6.5)

## 2015-04-08 LAB — HEPATIC FUNCTION PANEL
ALT: 15 U/L (ref 0–53)
AST: 17 U/L (ref 0–37)
Albumin: 3.9 g/dL (ref 3.5–5.2)
Alkaline Phosphatase: 66 U/L (ref 39–117)
BILIRUBIN TOTAL: 0.8 mg/dL (ref 0.2–1.2)
Bilirubin, Direct: 0.2 mg/dL (ref 0.0–0.3)
TOTAL PROTEIN: 6.9 g/dL (ref 6.0–8.3)

## 2015-04-08 LAB — TSH: TSH: 0.74 u[IU]/mL (ref 0.35–4.50)

## 2015-04-08 IMAGING — CR DG CHEST 2V
2 series · 2 of 2 positions shown · non-contrast
Comparison: CT [DATE].  Chest x-ray [DATE].

CLINICAL DATA: Edema.  Hypertension .

EXAM:
CHEST  2 VIEW

[view not recorded (1 of 2)]
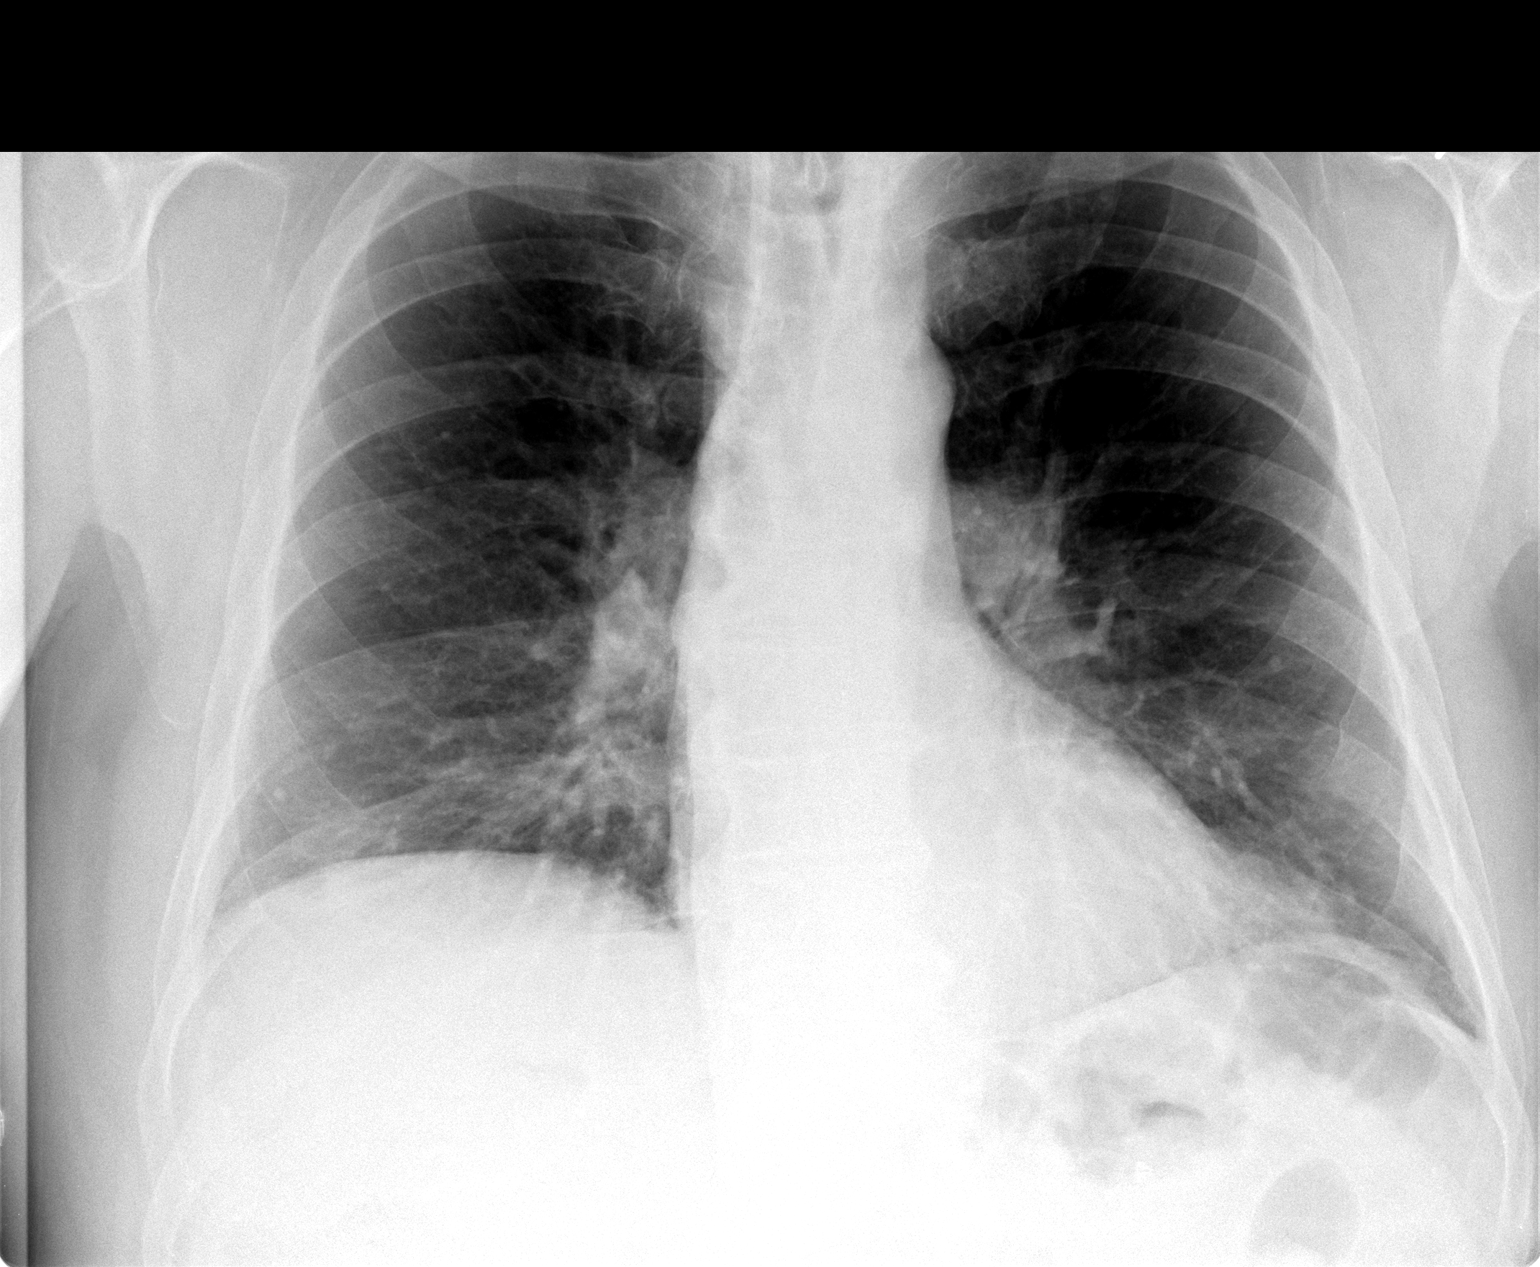

[view not recorded (2 of 2)]
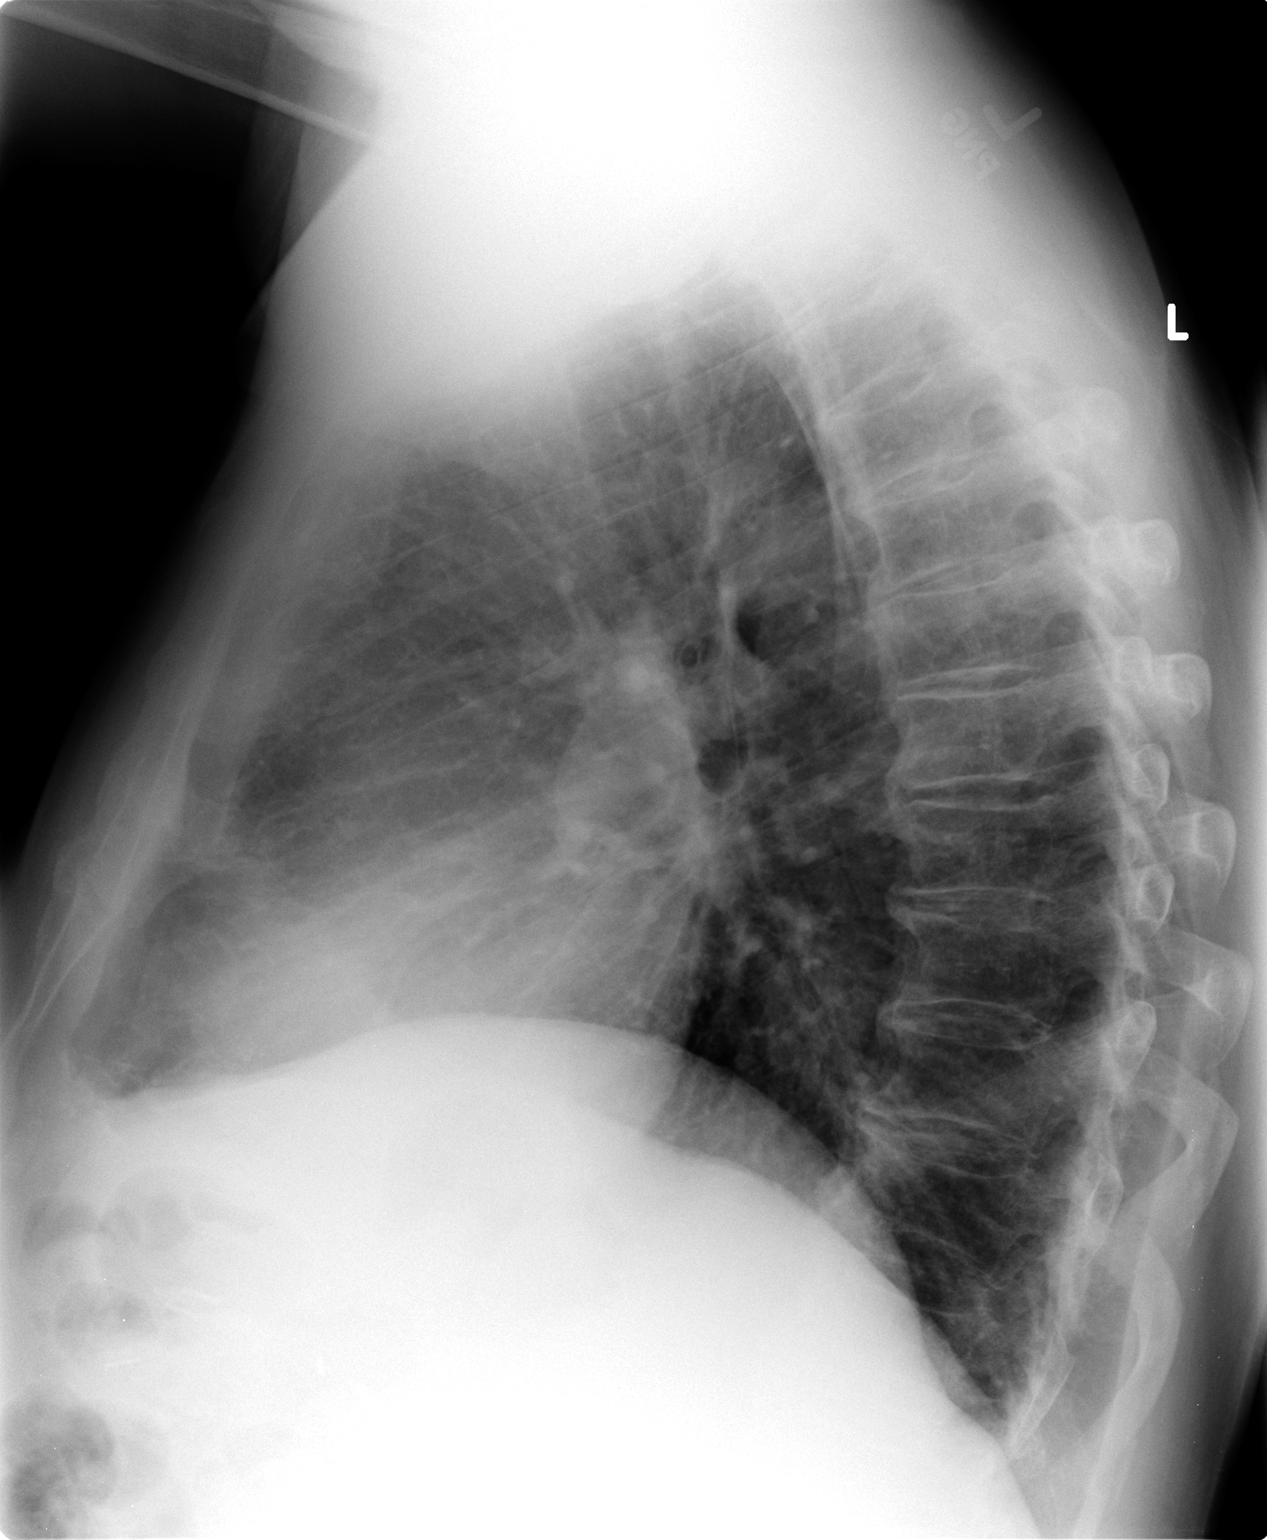

[2 of 2 positions shown; findings below may reference images not displayed]

FINDINGS: Mediastinum and hilar structures normal. Low lung volumes with mild
bibasilar atelectasis and/or infiltrates. No pleural effusion or
pneumothorax. Heart size stable. No acute bony abnormality.
Degenerative changes thoracic spine.
IMPRESSION: Lung volumes with mild bibasilar atelectasis and/or infiltrates.

## 2015-04-08 NOTE — Progress Notes (Signed)
   Subjective:    Patient ID: John Blackburn, male    DOB: 1946/04/03, 69 y.o.   MRN: [id number]  HPI He awoke 04/06/15 with bilateral edema, worse on the left. He had a similar episode 2 weeks ago which resolved overnight. He questioned whether this might be related to increase in  the trazodone dose from 50 mg 100 mg approximately 2 weeks ago. This was prescribed by his Psychiatrist.  Since stopping the trazodone there's been some improvement but there is persistent edema. There was no other triggers such as increased salt intake or prolonged travel.  He does have sleep apnea but has been compliant with his CPAP.  He's worried about cardiac status. His father had heart attack at 62; paternal uncle in his 71s; and paternal grandfather at 64. No family history of diabetes.  Labs in January the shear revealed an LDL of 96 on no statin. Triglycerides were elevated at 169 and HDL decreased at 31.9. Glucose was 110. At that time renal function and hepatic function were normal.   Review of Systems   Chest pain, palpitations, tachycardia, exertional dyspnea, paroxysmal nocturnal dyspnea, or claudication are absent.   Allergic rhinitis is a significant problem with nasal discharge which is white. He also has itchy, watery eyes and sneezing.  He describes excess urination  He has numbness in his feet.    Objective:   Physical Exam  Pertinent or positive findings include: He has no neck vein distention at 10. There is no hepatojugular reflux. He has one half-1+ pitting edema of the ankles/feet. Toenails are thickened and deformed.  General appearance :adequately nourished; in no distress.  Eyes: No conjunctival inflammation or scleral icterus is present.  Nares w/o erythema or secretions.  TMs normal.  Oral exam:  Lips and gums are healthy appearing.There is no oropharyngeal erythema or exudate noted. Dental hygiene is good.  Heart:  Normal rate and regular rhythm. S1 and S2 normal  without gallop, murmur, click, rub or other extra sounds    Lungs:Chest clear to auscultation; no wheezes, rhonchi,rales ,or rubs present.No increased work of breathing.   Abdomen: bowel sounds normal, soft and non-tender without masses, organomegaly or hernias noted.  No guarding or rebound.   Vascular : all pulses equal ; no bruits present.  Skin:Warm & dry.  Intact without suspicious lesions or rashes ; no tenting or jaundice   Lymphatic: No lymphadenopathy is noted about the head, neck, axilla   Neuro: Strength, tone & DTRs normal.         Assessment & Plan:  #1 edema, subjectively attributed to trazodone. Some improvement off the medication but residual edema persists  #2 sleep apnea; he maintains if he is compliant with the CPAP.  #3 hyperglycemia.  #4 allergic rhinitis.  See orders and recommendations

## 2015-04-08 NOTE — Patient Instructions (Signed)
Plain Mucinex (NOT D) for thick secretions ;force NON dairy fluids .   Nasal cleansing in the shower as discussed with lather of mild shampoo.After 10 seconds wash off lather while  exhaling through nostrils. Make sure that all residual soap is removed to prevent irritation.  Flonase OR Nasacort AQ 1 spray in each nostril twice a day as needed. Use the "crossover" technique into opposite nostril spraying toward opposite ear @ 45 degree angle, not straight up into nostril.  Plain Allegra (NOT D )  160 daily , Loratidine 10 mg , OR Zyrtec 10 mg @ bedtime  as needed for itchy eyes & sneezing.   Your next office appointment will be determined based upon review of your pending labs  and  xrays  Those written interpretation of the lab results and instructions will be transmitted to you by mail for your records.  Critical results will be called.   Followup as needed for any active or acute issue. Please report any significant change in your symptoms.

## 2015-04-08 NOTE — Progress Notes (Signed)
Pre visit review using our clinic review tool, if applicable. No additional management support is needed unless otherwise documented below in the visit note. 

## 2015-04-10 ENCOUNTER — Telehealth: Payer: Self-pay | Admitting: Internal Medicine

## 2015-04-10 NOTE — Telephone Encounter (Signed)
Patient is requesting call back on lab results that Dr. Linna Darner ordered .

## 2015-04-10 NOTE — Telephone Encounter (Signed)
Spoke with pt to give lab results. 

## 2015-05-26 ENCOUNTER — Encounter: Payer: Self-pay | Admitting: Internal Medicine

## 2015-05-26 ENCOUNTER — Other Ambulatory Visit (INDEPENDENT_AMBULATORY_CARE_PROVIDER_SITE_OTHER): Payer: Medicare Other

## 2015-05-26 ENCOUNTER — Ambulatory Visit (INDEPENDENT_AMBULATORY_CARE_PROVIDER_SITE_OTHER): Payer: Medicare Other | Admitting: Internal Medicine

## 2015-05-26 ENCOUNTER — Ambulatory Visit (INDEPENDENT_AMBULATORY_CARE_PROVIDER_SITE_OTHER)
Admission: RE | Admit: 2015-05-26 | Discharge: 2015-05-26 | Disposition: A | Discharge status: Home / Self Care | Payer: Medicare Other | Source: Ambulatory Visit | Attending: Internal Medicine | Admitting: Internal Medicine

## 2015-05-26 VITALS — BP 132/76 | HR 84 | Temp 97.9°F | Resp 16 | Wt 239.0 lb

## 2015-05-26 DIAGNOSIS — Z86711 Personal history of pulmonary embolism: Secondary | ICD-10-CM | POA: Diagnosis not present

## 2015-05-26 DIAGNOSIS — Z23 Encounter for immunization: Secondary | ICD-10-CM

## 2015-05-26 DIAGNOSIS — R609 Edema, unspecified: Secondary | ICD-10-CM

## 2015-05-26 DIAGNOSIS — G4731 Primary central sleep apnea: Secondary | ICD-10-CM

## 2015-05-26 DIAGNOSIS — I10 Essential (primary) hypertension: Secondary | ICD-10-CM

## 2015-05-26 LAB — BRAIN NATRIURETIC PEPTIDE: Pro B Natriuretic peptide (BNP): 44 pg/mL (ref 0.0–100.0)

## 2015-05-26 IMAGING — CR DG CHEST 2V
2 series · 2 of 2 positions shown · non-contrast
Comparison: [DATE] and chest CT [DATE]

CLINICAL DATA: Recurrent edema.  History of pulmonary embolism.

EXAM:
CHEST  2 VIEW

[view not recorded (1 of 2)]
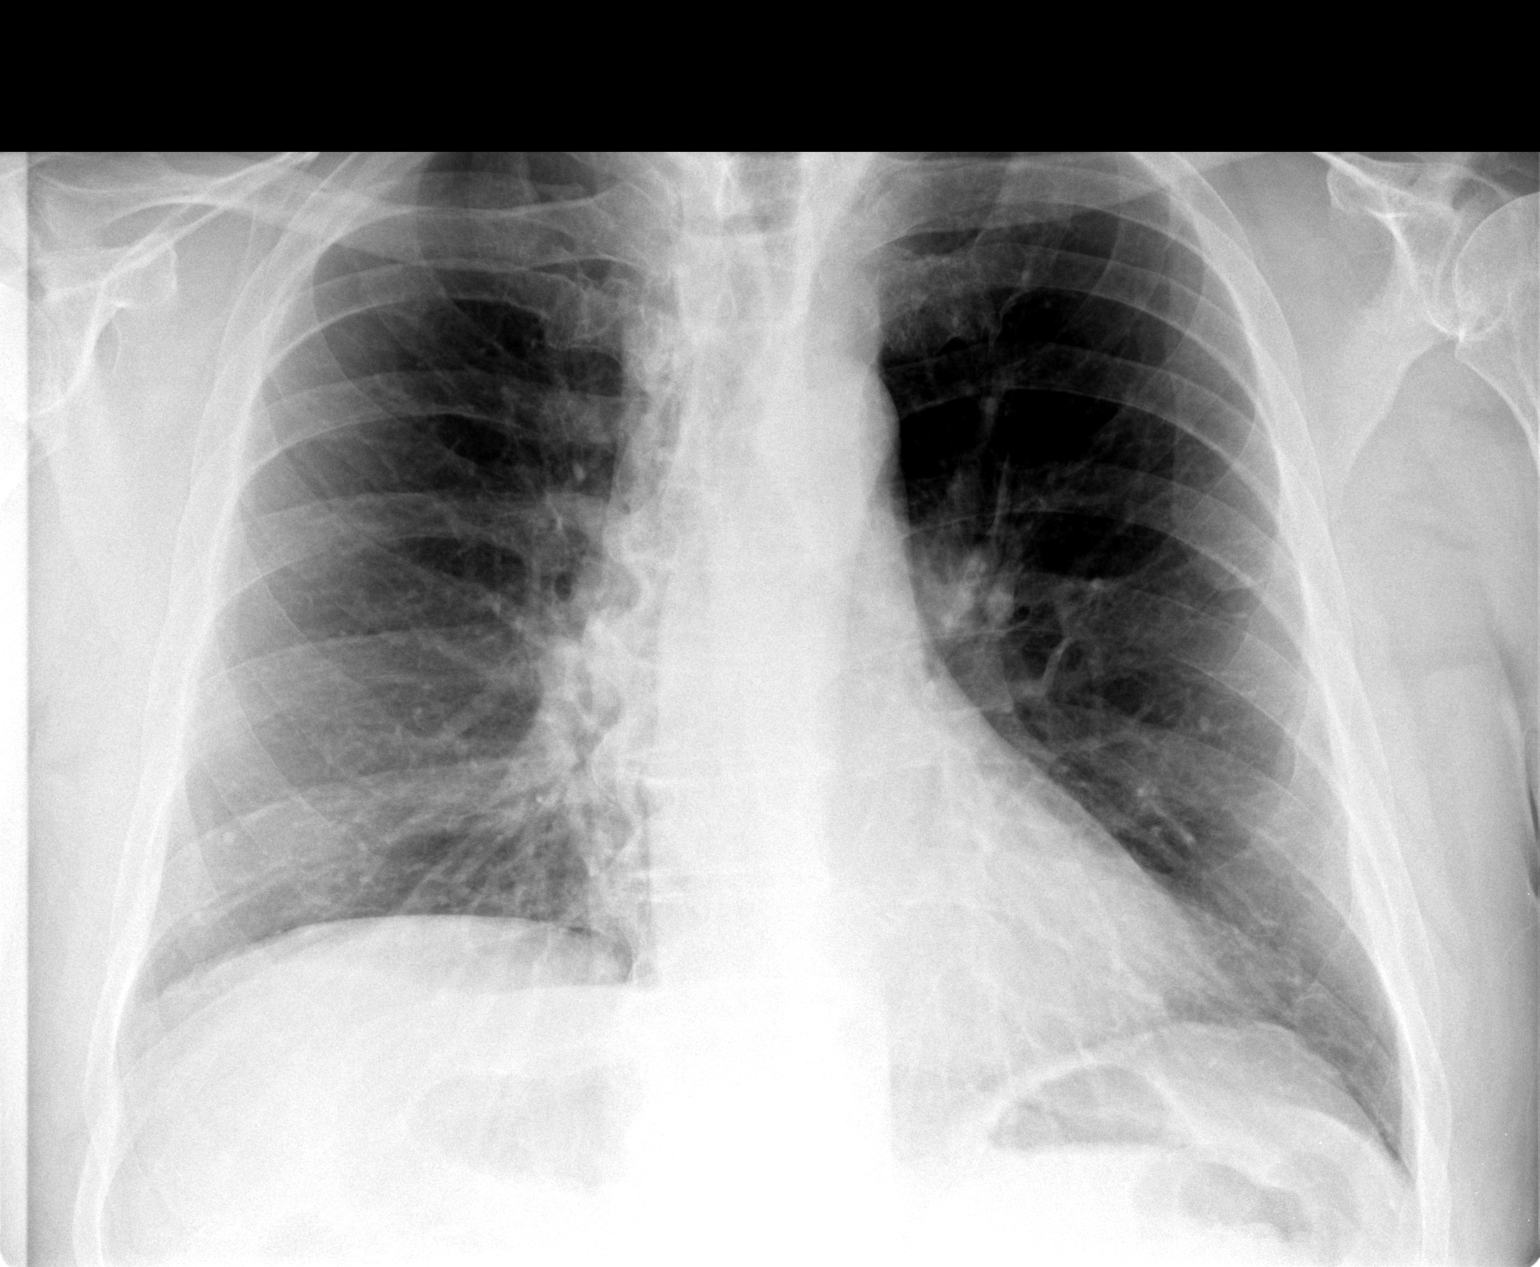

[view not recorded (2 of 2)]
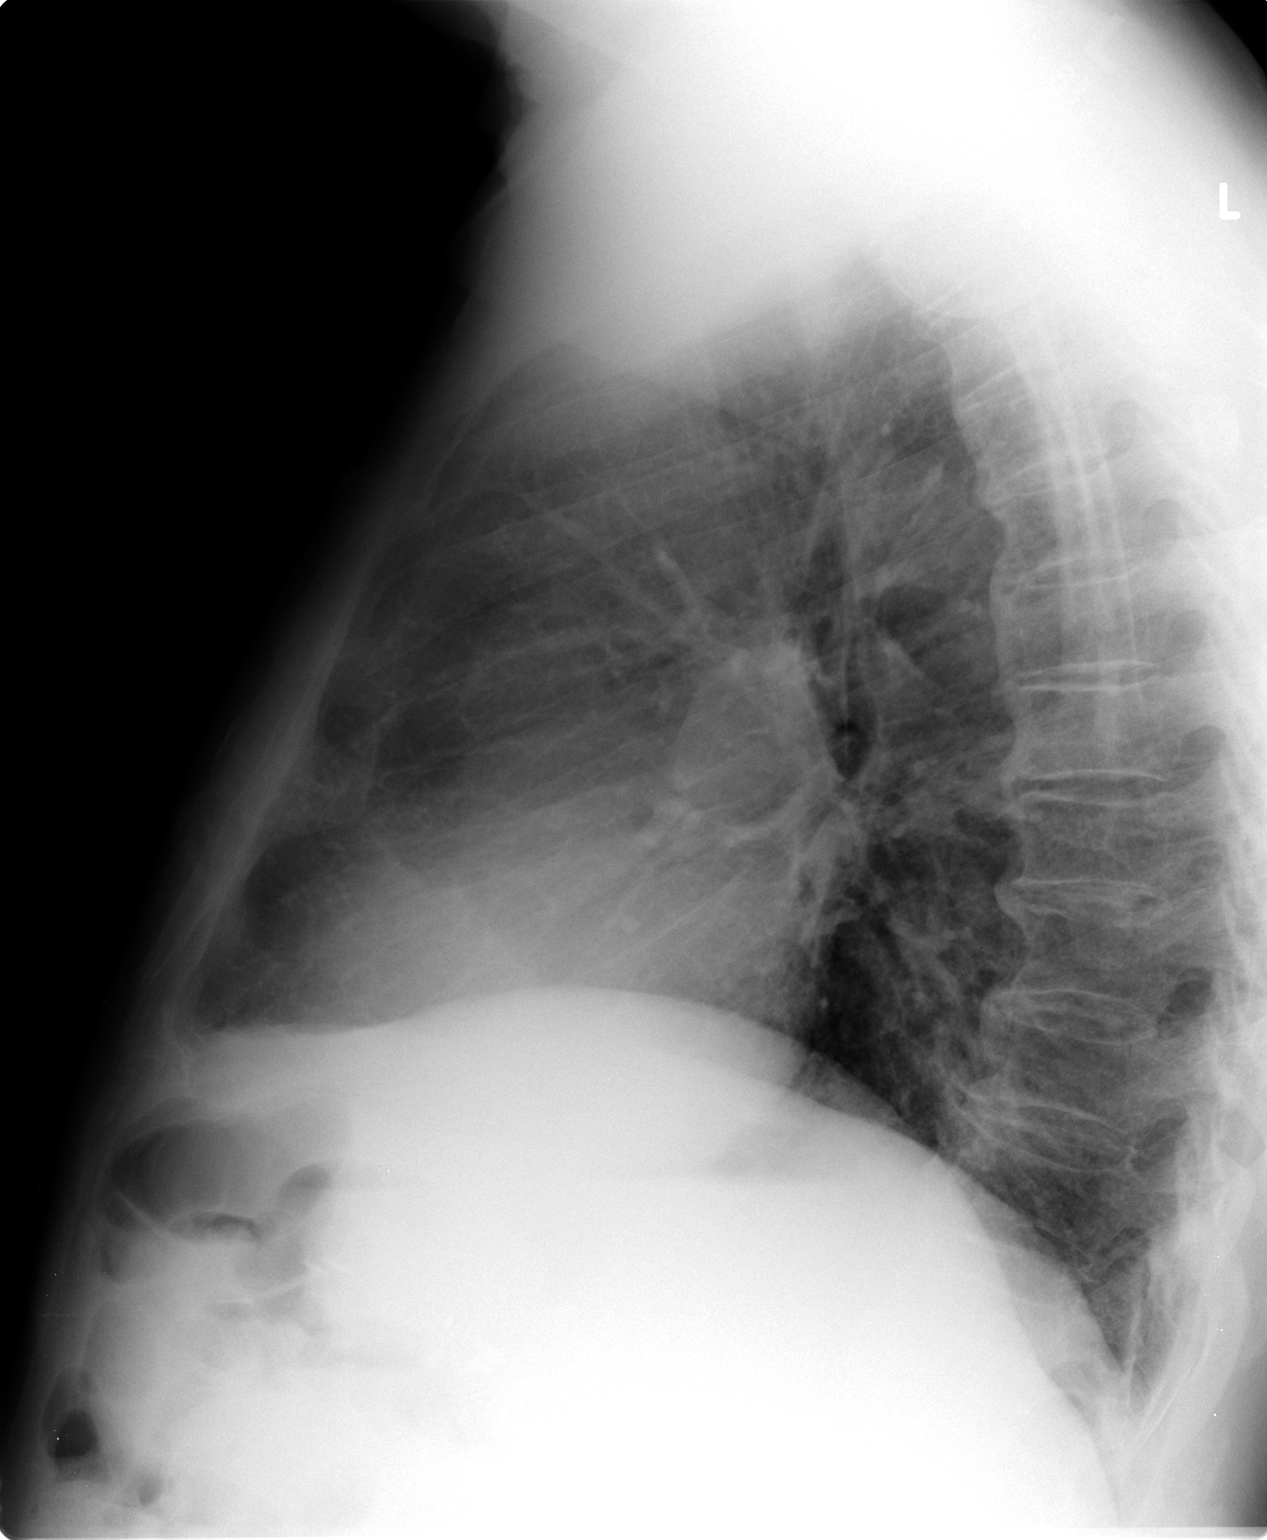

[2 of 2 positions shown; findings below may reference images not displayed]

FINDINGS: The heart size and mediastinal contours are within normal limits.
Both lungs are clear. Pulmonary vascularity is normal. Typical
degenerative changes of the thoracic spine.
IMPRESSION: No active cardiopulmonary disease.

## 2015-05-26 MED ORDER — METOPROLOL TARTRATE 25 MG PO TABS: 25.0000 mg | ORAL_TABLET | Freq: Two times a day (BID) | ORAL | Status: DC

## 2015-05-26 MED ORDER — FUROSEMIDE 40 MG PO TABS: 40.0000 mg | ORAL_TABLET | Freq: Every day | ORAL | Status: DC

## 2015-05-26 NOTE — Progress Notes (Signed)
Pre visit review using our clinic review tool, if applicable. No additional management support is needed unless otherwise documented below in the visit note. 

## 2015-05-26 NOTE — Progress Notes (Signed)
   Subjective:    Patient ID: John Blackburn, male    DOB: [birth year], 69 y.o.   MRN: [id number]  HPI His symptoms began 05/25/15 when he awoke with bilateral edema. There was some discomfort in the feet with the swelling. He eats out but adds little salt to food at the table. He will eat fried foods, fast foods and frozen dinners as well.  He did travel to the beach riding over 5 hours 9/10 as well as on return 9/17. He has had no associated cardiovascular symptoms  He states he is compliant with his CPAP.  The original episode of edema was in mid July. This resolved with an adjustment in his trazodone dose  He was seen 8/3 with the edema. At that time his hepatorenal function, thyroid function, and A1c were normal.  He is a past medical history of acute pulmonary embolism in February 2015.  Review of Systems   Chest pain, palpitations, tachycardia, exertional dyspnea, paroxysmal nocturnal dyspnea, or claudication  are absent.   There is no significant cough, sputum production,hemoptysis, wheezing,or  paroxysmal nocturnal dyspnea. Unexplained weight loss, abdominal pain, significant dyspepsia, dysphagia, melena, rectal bleeding, or persistently small caliber stools are not present. Dysuria, pyuria, hematuria, frequency, nocturia or polyuria are denied.    Objective:   Physical Exam Pertinent or positive findings include: He has no neck vein distention at 0. There is no hepatojugular reflux. He does have 1+ pitting edema. Pedal pulses are excellent. He has deformity of the great toes. He also has some flexion contractures of toes.  General appearance :adequately nourished; in no distress.  Eyes: No conjunctival inflammation or scleral icterus is present.  Oral exam:  Lips and gums are healthy appearing.There is no oropharyngeal erythema or exudate noted. Dental hygiene is good.  Heart:  Normal rate and regular rhythm. S1 and S2 normal without gallop, murmur, click, rub or other  extra sounds    Lungs:Chest clear to auscultation; no wheezes, rhonchi,rales ,or rubs present.No increased work of breathing.   Abdomen: bowel sounds normal, soft and non-tender without masses, organomegaly or hernias noted.  No guarding or rebound.  Vascular : all pulses equal ; no bruits present.  Skin:Warm & dry.  Intact without suspicious lesions or rashes ; no tenting or jaundice   Lymphatic: No lymphadenopathy is noted about the head, neck, axilla, or inguinal areas.   Neuro: Strength, tone & DTRs normal.     Assessment & Plan:  #1 edema  #2 obstructive sleep apnea, CPAP compliant  #3 PMH of PTE  Plan: See orders and recommendations

## 2015-05-26 NOTE — Patient Instructions (Signed)
Your ideal BP goal = AVERAGE < 135/85.Minimal goal is average < 140/90. Avoid ingestion of  excess salt/sodium.Cook with pepper & other spices . Use the salt substitute "No Salt" OR the Mrs Deliah Boston products to season food @ the table. Avoid foods which taste salty or "vinegary" as their sodium content will be high.  Wear over the calf support hose if you are standing for prolonged periods of  time or working on your feet for prolonged periods of time.   Your next office appointment will be determined based upon review of your pending labs  and  xrays  Those written interpretation of the lab results and instructions will be transmitted to you by My Chart   Critical results will be called.   Followup as needed for any active or acute issue. Please report any significant change in your symptoms.

## 2015-05-27 ENCOUNTER — Other Ambulatory Visit: Payer: Self-pay | Admitting: Internal Medicine

## 2015-05-27 DIAGNOSIS — R609 Edema, unspecified: Secondary | ICD-10-CM

## 2015-05-27 DIAGNOSIS — R7989 Other specified abnormal findings of blood chemistry: Secondary | ICD-10-CM

## 2015-05-27 DIAGNOSIS — Z86711 Personal history of pulmonary embolism: Secondary | ICD-10-CM

## 2015-05-27 LAB — D-DIMER, QUANTITATIVE (NOT AT ARMC): D DIMER QUANT: 0.68 ug{FEU}/mL — AB (ref 0.00–0.48)

## 2015-05-29 ENCOUNTER — Ambulatory Visit (HOSPITAL_COMMUNITY)
Admission: RE | Admit: 2015-05-29 | Discharge: 2015-05-29 | Disposition: A | Discharge status: Home / Self Care | Payer: Medicare Other | Source: Ambulatory Visit | Attending: Internal Medicine | Admitting: Internal Medicine

## 2015-05-29 DIAGNOSIS — Z86711 Personal history of pulmonary embolism: Secondary | ICD-10-CM | POA: Diagnosis not present

## 2015-05-29 DIAGNOSIS — E785 Hyperlipidemia, unspecified: Secondary | ICD-10-CM | POA: Diagnosis not present

## 2015-05-29 DIAGNOSIS — I10 Essential (primary) hypertension: Secondary | ICD-10-CM | POA: Insufficient documentation

## 2015-05-29 DIAGNOSIS — R791 Abnormal coagulation profile: Secondary | ICD-10-CM | POA: Insufficient documentation

## 2015-05-29 DIAGNOSIS — R7989 Other specified abnormal findings of blood chemistry: Secondary | ICD-10-CM

## 2015-05-29 DIAGNOSIS — R609 Edema, unspecified: Secondary | ICD-10-CM | POA: Diagnosis not present

## 2015-09-28 ENCOUNTER — Other Ambulatory Visit: Payer: Self-pay | Admitting: Internal Medicine

## 2015-10-13 ENCOUNTER — Ambulatory Visit (INDEPENDENT_AMBULATORY_CARE_PROVIDER_SITE_OTHER): Payer: PPO

## 2015-10-13 VITALS — BP 126/76 | Ht 75.0 in | Wt 236.0 lb

## 2015-10-13 DIAGNOSIS — Z0000 Encounter for general adult medical examination without abnormal findings: Secondary | ICD-10-CM

## 2015-10-13 NOTE — Patient Instructions (Addendum)
John Blackburn , Thank you for taking time to come for your Medicare Wellness Visit. I appreciate your ongoing commitment to your health goals. Please review the following plan we discussed and let me know if I can assist you in the future.   1. Will call insurance regarding mental health doctor ([place], alemd) to authorize further visits due to medication running out; Will need to auth more visit this month or refer to another md this month  2. Transfer shower bench;  3. Check with [facility] and get date of shingles or pneumonia vaccine called the prevnar 13   4. Will discuss colonoscopy repeat with Dr. Sharlet Salina due to strong family hx  5. [email or web address] with groceries to conserve resources as needed   [email or web address]    [facility]  No reviews  [facility]  [id number]  [phone]  Will fup with Dr. Sharlet Salina regarding nasal congestion and r/o stroke due to personality changes and other    These are the goals we discussed:/ did discuss cleaning apt; one room at a time; starting with bedroom; 30 minutes a day Goals    None      This is a list of the screening recommended for you and due dates:  Health Maintenance  Topic Date Due  .  Hepatitis C: One time screening is recommended by Center for Disease Control  (CDC) for  adults born from [birth year] through 1965.   1945-11-19  . Shingles Vaccine  03/16/2006  . Pneumonia vaccines (2 of 2 - PCV13) 05/15/2013  . Flu Shot  04/05/2016  . Colon Cancer Screening  03/06/2019  . Tetanus Vaccine  05/15/2022     Health Maintenance, Male A healthy lifestyle and preventative care can promote health and wellness.  Maintain regular health, dental, and eye exams.  Eat a healthy diet. Foods like vegetables, fruits, whole grains, low-fat dairy products, and lean protein foods contain the nutrients you need and are low in calories. Decrease your intake of  foods high in solid fats, added sugars, and salt. Get information about a proper diet from your health care provider, if necessary.  Regular physical exercise is one of the most important things you can do for your health. Most adults should get at least 150 minutes of moderate-intensity exercise (any activity that increases your heart rate and causes you to sweat) each week. In addition, most adults need muscle-strengthening exercises on 2 or more days a week.   Maintain a healthy weight. The body mass index (BMI) is a screening tool to identify possible weight problems. It provides an estimate of body fat based on height and weight. Your health care provider can find your BMI and can help you achieve or maintain a healthy weight. For males 20 years and older:  A BMI below 18.5 is considered underweight.  A BMI of 18.5 to 24.9 is normal.  A BMI of 25 to 29.9 is considered overweight.  A BMI of 30 and above is considered obese.  Maintain normal blood lipids and cholesterol by exercising and minimizing your intake of saturated fat. Eat a balanced diet with plenty of fruits and vegetables. Blood tests for lipids and cholesterol should begin at age 64 and be repeated every 5 years. If your lipid or cholesterol levels are high, you are over age 3, or you are at high risk for heart disease, you may need your cholesterol levels checked more frequently.Ongoing high lipid and cholesterol levels  should be treated with medicines if diet and exercise are not working.  If you smoke, find out from your health care provider how to quit. If you do not use tobacco, do not start.  Lung cancer screening is recommended for adults aged 28-80 years who are at high risk for developing lung cancer because of a history of smoking. A yearly low-dose CT scan of the lungs is recommended for people who have at least a 30-pack-year history of smoking and are current smokers or have quit within the past 15 years. A pack year  of smoking is smoking an average of 1 pack of cigarettes a day for 1 year (for example, a 30-pack-year history of smoking could mean smoking 1 pack a day for 30 years or 2 packs a day for 15 years). Yearly screening should continue until the smoker has stopped smoking for at least 15 years. Yearly screening should be stopped for people who develop a health problem that would prevent them from having lung cancer treatment.  If you choose to drink alcohol, do not have more than 2 drinks per day. One drink is considered to be 12 oz (360 mL) of beer, 5 oz (150 mL) of wine, or 1.5 oz (45 mL) of liquor.  Avoid the use of street drugs. Do not share needles with anyone. Ask for help if you need support or instructions about stopping the use of drugs.  High blood pressure causes heart disease and increases the risk of stroke. High blood pressure is more likely to develop in:  People who have blood pressure in the end of the normal range (100-139/85-89 mm Hg).  People who are overweight or obese.  People who are African American.  If you are 64-20 years of age, have your blood pressure checked every 3-5 years. If you are 82 years of age or older, have your blood pressure checked every year. You should have your blood pressure measured twice--once when you are at a hospital or clinic, and once when you are not at a hospital or clinic. Record the average of the two measurements. To check your blood pressure when you are not at a hospital or clinic, you can use:  An automated blood pressure machine at a pharmacy.  A home blood pressure monitor.  If you are 52-48 years old, ask your health care provider if you should take aspirin to prevent heart disease.  Diabetes screening involves taking a blood sample to check your fasting blood sugar level. This should be done once every 3 years after age 57 if you are at a normal weight and without risk factors for diabetes. Testing should be considered at a younger  age or be carried out more frequently if you are overweight and have at least 1 risk factor for diabetes.  Colorectal cancer can be detected and often prevented. Most routine colorectal cancer screening begins at the age of 42 and continues through age 30. However, your health care provider may recommend screening at an earlier age if you have risk factors for colon cancer. On a yearly basis, your health care provider may provide home test kits to check for hidden blood in the stool. A small camera at the end of a tube may be used to directly examine the colon (sigmoidoscopy or colonoscopy) to detect the earliest forms of colorectal cancer. Talk to your health care provider about this at age 23 when routine screening begins. A direct exam of the colon should be repeated every  5-10 years through age 17, unless early forms of precancerous polyps or small growths are found.  People who are at an increased risk for hepatitis B should be screened for this virus. You are considered at high risk for hepatitis B if:  You were born in a country where hepatitis B occurs often. Talk with your health care provider about which countries are considered high risk.  Your parents were born in a high-risk country and you have not received a shot to protect against hepatitis B (hepatitis B vaccine).  You have HIV or AIDS.  You use needles to inject street drugs.  You live with, or have sex with, someone who has hepatitis B.  You are a man who has sex with other men (MSM).  You get hemodialysis treatment.  You take certain medicines for conditions like cancer, organ transplantation, and autoimmune conditions.  Hepatitis C blood testing is recommended for all people born from [birth year] through 1965 and any individual with known risk factors for hepatitis C.  Healthy men should no longer receive prostate-specific antigen (PSA) blood tests as part of routine cancer screening. Talk to your health care provider about  prostate cancer screening.  Testicular cancer screening is not recommended for adolescents or adult males who have no symptoms. Screening includes self-exam, a health care provider exam, and other screening tests. Consult with your health care provider about any symptoms you have or any concerns you have about testicular cancer.  Practice safe sex. Use condoms and avoid high-risk sexual practices to reduce the spread of sexually transmitted infections (STIs).  You should be screened for STIs, including gonorrhea and chlamydia if:  You are sexually active and are younger than 24 years.  You are older than 24 years, and your health care provider tells you that you are at risk for this type of infection.  Your sexual activity has changed since you were last screened, and you are at an increased risk for chlamydia or gonorrhea. Ask your health care provider if you are at risk.  If you are at risk of being infected with HIV, it is recommended that you take a prescription medicine daily to prevent HIV infection. This is called pre-exposure prophylaxis (PrEP). You are considered at risk if:  You are a man who has sex with other men (MSM).  You are a heterosexual man who is sexually active with multiple partners.  You take drugs by injection.  You are sexually active with a partner who has HIV.  Talk with your health care provider about whether you are at high risk of being infected with HIV. If you choose to begin PrEP, you should first be tested for HIV. You should then be tested every 3 months for as long as you are taking PrEP.  Use sunscreen. Apply sunscreen liberally and repeatedly throughout the day. You should seek shade when your shadow is shorter than you. Protect yourself by wearing long sleeves, pants, a wide-brimmed hat, and sunglasses year round whenever you are outdoors.  Tell your health care provider of new moles or changes in moles, especially if there is a change in shape or  color. Also, tell your health care provider if a mole is larger than the size of a pencil eraser.  A one-time screening for abdominal aortic aneurysm (AAA) and surgical repair of large AAAs by ultrasound is recommended for men aged 41-75 years who are current or former smokers.  Stay current with your vaccines (immunizations).   This information  is not intended to replace advice given to you by your health care provider. Make sure you discuss any questions you have with your health care provider.   Document Released: 02/18/2008 Document Revised: 09/12/2014 Document Reviewed: 01/17/2011 Elsevier Interactive Patient Education [employer].

## 2015-10-13 NOTE — Progress Notes (Signed)
Subjective:   JSHAUN VOIT is a 70 y.o. male who presents for Medicare Annual (Subsequent) preventive examination.  Review of Systems:  HRA assessment completed during visit;  The Patient was informed that this wellness visit is to identify risk and educate on how to reduce risk for increase disease through lifestyle changes.   ROS deferred to CPE exam with physician at next [place]  Discussed changing to Male practitioner; Will schedule 30 min new patient apt with Terri Piedra for labs; question of neuro vs psych issues as his sister thinks he may have had a stroke over the last couple of years due to changes;   MMSE deferred due to mental health hx and fup for s/s of stroke or referral to neurology if appropriate. 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 No failures of ADL's, Admits apt is "cluttered" and will not let anyone in to help him. Set goal to clean one room; Misses being social; misses being married; No voiced issues of paranoia or other; states he is doing ok and is managing;   Medical issues today; states he has had congestion every day; Does not cough;  Use Flonase but just breaks of the mucous;  Hoarseness today; the patient states  x 70 yo; went to [facility]; throat was not opening and closing at it should; Tried Voice exercises which helped.   Would like colonoscopy q 2 to 3 years due to family hx of colon cancer; Last colonoscopy 03/2014  Hx DVT and PE;  BP reading brought in by patient for January; about 8 days; 147/88 x 1; The other BP between 130 /78 Recent BP checks in Feb; BP 120/68; 123/65; 127/ 74  Discussed leg swelling last year and switched to another BP med   BMI:  32 Diet; Smoothie for breakfast; BB, kale; chia seed and flax; whey protein Usually does not eat lunch; sometimes a snack at lunch Supper; homemade soup; salad; hushpuppies   Lives alone Has 3 sons; 2 live in [place]; one lives in [place] One granddtr 20 yo  Exercise; Trying to walk a mile every day; mentally this is making him feel better; exercises in the late afternoon  SAFETY" one level;  Safety reviewed for the home;  Removal of clutter clearing paths through the home,  Railing as needed;  Bathroom safety; shower in tub; Used to love to take a bath Couldn't get out of the tub; discussed shower transfer bench; Will proceed with getting this for safety  Community safety; yes Smoke detectors yes Firearms safety reviewed and to keep in a safe place if available  Driving accidents and seatbelt/ no Sun protection/ does not; wears a hat  Stressors; Maybe 5; does not like living alone Good support group but not as fulfilling as relationship    Medication review/no issues noted;   Fall assessment; fell one time in apt; approx. 8 months ago Tripped but did not get hurt; discussed cleaning home.    Mobilization and Functional losses in the last year/ no Sleep patterns/ pretty good; sometimes have issues getting to sleep  Used trazadone; started having swollen feet;   Urinary or fecal incontinence reviewed/ still gets up x 2  to void at hs PSA checked; goes to urology Dr. Rosana Hoes   Counseling:  Colonoscopy; completed 03/2014; 03/2019 due  States sister developed colon cancer and this was 1.5 years  Grandmother had colon cancer and cousin died of colon cancer;  Would like to have one q 2 to 3 years and can discuss with doctor   EKG: no record Hearing: took hearing test several years ago and did get hearing aid;   Ophthalmology exam; had one 6 months ago; has one annually   Immunizations Due; had PSV 23 and now needs prevnar 13;  Will Double check for receipt of prevnar and shingles  at [facility] prior to administering.   Current Care Team reviewed and updated Psych Dr. Mayo Ao; Alemd;        Objective:     Vitals: BP 126/76 mmHg  Ht 6\' 3"  (1.905 m)  Wt 236 lb (107.049 kg)  BMI 29.50 kg/m2  Tobacco History  Smoking status  . Never Smoker   Smokeless tobacco  . Never Used     Counseling given: Not Answered   Past Medical History  Diagnosis Date  . 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 Depression     on multiple meds. Has been seen at [facility]. and Dr. Sabra Heck is his prescriber  . Diverticulitis of colon 2000 's    treated as an outpatient  . GERD (gastroesophageal reflux disease)     UGI done August '12 - ulcer/. Dr. Ferdinand Lango, gastroenterologist in [facility].   . Peptic ulcer     in the past and just recently-August '12  . Hypertension   . Hyperlipidemia     last lipid panel: HDL 44, LDL 117  . Hx of adenomatous colonic polyps   . Sleep apnea, primary central     wears CPAP  . Allergy   . Pulmonary emboli     noted October 2014 - treated by Dr. Linda Hedges  . skin cancer     skin CA  . Clotting disorder     November 2014, no longer on blood thinngers   Past Surgical History  Procedure Laterality Date  . Cholecystectomy  1990    laproscopic   . Tkr bilateral  2006    Dr. Violet Baldy  . Joint replacement      knee  . Prostate surgery      needle biopsy's, TURP  . Hernia repair    . Vasectomy    . Upper gastrointestinal endoscopy     Family History  Problem Relation Age of Onset  . Stroke Mother   . Hypertension Mother   . Heart disease Mother   . Heart disease Father     CAD/MI-fatal sudden death  . Colon cancer Father   . Cancer Sister     colon cancer 20090-survivor  . Colon cancer Sister   . Heart disease Paternal Uncle   . Colon cancer Paternal Uncle   . Heart disease Paternal Grandfather   . Colon cancer Paternal Aunt   . Stomach cancer Neg Hx   . Rectal cancer Neg Hx   . Esophageal cancer Neg Hx     . Colon cancer Cousin   . Colon cancer Cousin   . Colon cancer Paternal Uncle    History  Sexual Activity  . Sexual Activity:  . Partners: Female    Outpatient Encounter Prescriptions as of 10/13/2015  Medication Sig  . amphetamine-dextroamphetamine (ADDERALL) 20 MG tablet Take 20 mg  by mouth daily.  [name] buPROPion (WELLBUTRIN XL) 300 MG 24 hr tablet Take 300 mg by mouth daily.  . Cholecalciferol 1000 UNITS tablet Take by mouth.  . furosemide (LASIX) 40 MG tablet TAKE ONE TABLET BY MOUTH ONCE DAILY  . Magnesium 250 MG TABS Take by mouth.  . Melatonin 1 MG/4ML LIQD Take by mouth at bedtime.  . metoprolol tartrate (LOPRESSOR) 25 MG tablet TAKE ONE TABLET BY MOUTH TWICE DAILY  . Multiple Vitamin (MULTI-VITAMINS) TABS Take by mouth.  . Turmeric 500 MG CAPS Take by mouth.  . budesonide (RHINOCORT AQUA) 32 MCG/ACT nasal spray Place 2 sprays into both nostrils daily. (Patient not taking: Reported on 10/13/2015)   No facility-administered encounter medications on file as of 10/13/2015.    Activities of Daily Living In your present state of health, do you have any difficulty performing the following activities: 10/13/2015  Hearing? N  Vision? N  Difficulty concentrating or making decisions? N  Walking or climbing stairs? N  Dressing or bathing? N  Doing errands, shopping? N  Preparing Food and eating ? N  Using the Toilet? N  In the past six months, have you accidently leaked urine? Y  Managing your Medications? N  Managing your Finances? N  Housekeeping or managing your Housekeeping? N    Patient Care Team: Hoyt Koch, MD as PCP - General (Internal Medicine) Kathie Rhodes, MD as Attending Physician (Urology)    Assessment:    Assessment   Patient presents for yearly preventative medicine examination. Medicare questionnaire screening were completed, i.e. Functional; fall risk; and hearing reviewed; Referred to [facility] for resources; hearing aids do not work well;   All  immunizations and health maintenance protocols were reviewed with the patient will fup to confirm receipt of prevnar or shingles at [facility] provided for laboratory screens;  To be done at next [facility]  Medication reconciliation, past medical history, social history, problem list and allergies were reviewed in detail with the patient  Goals were established with regard to cleaning home;   End of life planning was discussed and has been completed   Exercise Activities and Dietary recommendations Current Exercise Habits:: Home exercise routine, Type of exercise: walking, Time (Minutes): 20, Frequency (Times/Week): 5, Weekly Exercise (Minutes/Week): 100, Intensity: Mild  Goals    None     Fall Risk Fall Risk  10/13/2015 11/27/2014  Falls in the past year? Yes Yes  Number falls in past yr: 1 1  Injury with Fall? - No  Risk for fall due to : - Impaired balance/gait  Follow up Education provided -   Depression Screen PHQ 2/9 Scores 11/27/2014  PHQ - 2 Score 0     Cognitive Testing MMSE - Mini Mental State Exam 10/13/2015  Not completed: (No Data)   This patient has MH issues under treatment; Ad8 Score 1 Hold MMSE until provider reviews; the patient's sister thinks he may have had a stroke.  Immunization History  Administered Date(s) Administered  . Influenza Whole 08/13/2012  . Influenza, High Dose Seasonal PF 06/01/2013  . Influenza,inj,Quad PF,36+ Mos 06/19/2014, 05/26/2015  . Pneumococcal Polysaccharide-23 05/15/2012  . Td 05/15/2012   Screening Tests Health Maintenance  Topic Date Due  . Hepatitis C Screening  10-06-1945  . ZOSTAVAX  03/16/2006  . PNA vac Low Risk Adult (2 of 2 - PCV13) 05/15/2013  . INFLUENZA VACCINE  04/05/2016  . COLONOSCOPY  03/06/2019  . TETANUS/TDAP  05/15/2022      Plan:  Will schedule apt with Terri Piedra to review for ongoing sinus congestion; as well as establishing care and evaluating any neuro deficits or referrals;   Is to call  insurer to request Psych in network or to [place] doctor that wrote the rx for Wellbutrin;   Checking with [facility] for receipt of Prevnar 13 and shingles  See prior notes for fup OV  During the course of the visit the patient was educated and counseled about the following appropriate screening and preventive services:   Vaccines to include Pneumoccal, Influenza, Hepatitis B, Td, Zostavax, HCV  Electrocardiogram/ not noted in chart/ has cardiac studies  Cardiovascular Disease/ BP in good control  Colorectal cancer screening; would like colonoscopy q 2 to 3 years due to family hx  Diabetes screening to be done  Ntrition counseling / adequate at present  Patient Instructions (the written plan) was given to the patient.   Wynetta Fines, RN  10/13/2015

## 2015-10-14 NOTE — Progress Notes (Signed)
Medical screening examination/treatment/procedure(s) were performed by non-physician practitioner and as supervising physician I was immediately available for consultation/collaboration. I agree with above. Hoyt Koch, MD  Please note correction to top of note that patient is a male not male.

## 2015-10-30 ENCOUNTER — Telehealth: Payer: Self-pay | Admitting: Internal Medicine

## 2015-10-30 NOTE — Telephone Encounter (Signed)
Pt has an upcoming appt with Marya Amsler on the 28th. He has new insurance and he wanted to go over his medications with you to see if he will be able to prescribe them.  He needs to know this ahead of time to see if he needs to cancel his appt

## 2015-11-02 NOTE — Telephone Encounter (Signed)
LVM for pt to call back.

## 2015-11-02 NOTE — Telephone Encounter (Signed)
Spoke with John Blackburn about medications. Advised that all the medications that he listed could be filled by Marya Amsler. Did require that he bring in proof that he had been diagnosed with ADD or ADHD for adderall.

## 2015-11-03 ENCOUNTER — Ambulatory Visit (INDEPENDENT_AMBULATORY_CARE_PROVIDER_SITE_OTHER): Payer: PPO | Admitting: Family

## 2015-11-03 ENCOUNTER — Other Ambulatory Visit: Payer: PPO

## 2015-11-03 ENCOUNTER — Encounter: Payer: Self-pay | Admitting: Family

## 2015-11-03 VITALS — BP 108/70 | HR 56 | Temp 98.1°F | Resp 14 | Ht 75.0 in | Wt 232.0 lb

## 2015-11-03 DIAGNOSIS — F329 Major depressive disorder, single episode, unspecified: Secondary | ICD-10-CM | POA: Diagnosis not present

## 2015-11-03 DIAGNOSIS — F32A Depression, unspecified: Secondary | ICD-10-CM

## 2015-11-03 DIAGNOSIS — G4731 Primary central sleep apnea: Secondary | ICD-10-CM

## 2015-11-03 DIAGNOSIS — Z0000 Encounter for general adult medical examination without abnormal findings: Secondary | ICD-10-CM

## 2015-11-03 DIAGNOSIS — J370 Chronic laryngitis: Secondary | ICD-10-CM | POA: Diagnosis not present

## 2015-11-03 DIAGNOSIS — Z0189 Encounter for other specified special examinations: Secondary | ICD-10-CM

## 2015-11-03 MED ORDER — BUPROPION HCL ER (XL) 300 MG PO TB24: 300.0000 mg | ORAL_TABLET | Freq: Every day | ORAL | Status: DC

## 2015-11-03 MED ORDER — AMPHETAMINE-DEXTROAMPHETAMINE 20 MG PO TABS: 20.0000 mg | ORAL_TABLET | Freq: Two times a day (BID) | ORAL | Status: DC

## 2015-11-03 MED ORDER — SERTRALINE HCL 50 MG PO TABS: 50.0000 mg | ORAL_TABLET | Freq: Every day | ORAL | Status: DC

## 2015-11-03 MED ORDER — TADALAFIL 10 MG PO TABS: 10.0000 mg | ORAL_TABLET | Freq: Every day | ORAL | Status: DC | PRN

## 2015-11-03 NOTE — Patient Instructions (Signed)
Thank you for choosing [facility].  Summary/Instructions:  Your prescription(s) have been submitted to your pharmacy or been printed and provided for you. Please take as directed and contact our office if you believe you are having problem(s) with the medication(s) or have any questions.  Please stop by the lab on the basement level of the building for your blood work. Your results will be released to MyChart (or called to you) after review, usually within 72 hours after test completion. If any changes need to be made, you will be notified at that same time.  Referrals have been made during this visit. You should expect to hear back from our schedulers in about 7-10 days in regards to establishing an appointment with the specialists we discussed.   If your symptoms worsen or fail to improve, please contact our office for further instruction, or in case of emergency go directly to the emergency room at the closest medical facility.     

## 2015-11-03 NOTE — Progress Notes (Signed)
Subjective:    Patient ID: John Blackburn, male    DOB: 1946/06/23, 70 y.o.   MRN: [id number]  Chief Complaint  Patient presents with  . Establish Care    Pt is wanting a refill of adderall and wellbutrin, has some throat hoarsness that has been going on for a couple of years    HPI:  John Blackburn is a 70 y.o. male who  has a past medical history of Arthritis; Depression; Diverticulitis of colon (2000 's); GERD (gastroesophageal reflux disease); Peptic ulcer; Hypertension; Hyperlipidemia; adenomatous colonic polyps; Sleep apnea, primary central; Allergy; Pulmonary emboli ([place]); skin cancer; and Clotting disorder ([place]). and presents today for an office visit.  1.) Depression - Currently maintained on Wellbutrin. Reports taking the medication as prescribed and denies adverse side effects. Denies suicidal ideation. Reports that he does not believe that the medication is working that well. Continues to have a sense of sadness. Has been struggling with coping through a divorce about 10 years ago. In the past he has been trialed on Pristiq and Effexor which did not provide much symptom improvement.    2.) Sleep apena/Narcolepsy  - Currently using CPAP. Reports that he is compliant with the CPAP usage and reports that his daytime sleepiness is decreased when he takes the Adderal in addition to the CPAP. He was previously managed through [facility] and since his insurance changed he was needing to switch providers for sleep apnea management.   3.) Chronic hoarseness - Continues to experience the associated symptom of hoarseness that has been going on since 2009. He was evaluated by ENT at the time with concern for GERD as the primary cause. Denies any symptoms of GERD and is currently not maintained on medication.  4.) Hepatitis C screening - Has not been tested for hepatitis C and would like to be screened based on the current recommendations.    Allergies  Allergen Reactions  .  Cephalexin Rash  . Levofloxacin Rash     Current Outpatient Prescriptions on File Prior to Visit  Medication Sig Dispense Refill  . budesonide (RHINOCORT AQUA) 32 MCG/ACT nasal spray Place 2 sprays into both nostrils daily. 8.6 g 11  . Cholecalciferol 1000 UNITS tablet Take by mouth.    . furosemide (LASIX) 40 MG tablet TAKE ONE TABLET BY MOUTH ONCE DAILY 30 tablet 3  . Magnesium 250 MG TABS Take by mouth.    . Melatonin 1 MG/4ML LIQD Take by mouth at bedtime.    . metoprolol tartrate (LOPRESSOR) 25 MG tablet TAKE ONE TABLET BY MOUTH TWICE DAILY 60 tablet 3  . Multiple Vitamin (MULTI-VITAMINS) TABS Take by mouth.    . Turmeric 500 MG CAPS Take by mouth.     No current facility-administered medications on file prior to visit.     Past Surgical History  Procedure Laterality Date  . Cholecystectomy  1990    laproscopic   . Tkr bilateral  2006    Dr. Violet Baldy  . Joint replacement      knee  . Prostate surgery      needle biopsy's, TURP  . Hernia repair    . Vasectomy    . Upper gastrointestinal endoscopy      Past Medical History  Diagnosis Date  . 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 Depression     on multiple meds. Has been seen at [facility]. and Dr. Sabra Heck is his prescriber  . Diverticulitis of colon  2000 's    treated as an outpatient  . GERD (gastroesophageal reflux disease)     UGI done August '12 - ulcer/. Dr. Ferdinand Lango, gastroenterologist in [facility].   . Peptic ulcer     in the past and just recently-August '12  . Hypertension   . Hyperlipidemia     last lipid panel: HDL 44, LDL 117  . Hx of adenomatous colonic polyps   . Sleep apnea, primary central     wears CPAP  . Allergy   . Pulmonary emboli [facility])     noted October 2014 - treated by Dr. Linda Hedges  . skin cancer     skin CA  . Clotting disorder [facility])     November 2014, no longer on blood thinngers     Review of Systems  Constitutional: Negative for fever and chills.    Eyes:       Negative for changes in vision.  Respiratory: Negative for chest tightness and shortness of breath.   Cardiovascular: Negative for chest pain, palpitations and leg swelling.  Neurological: Negative for headaches.  Psychiatric/Behavioral: Positive for dysphoric mood. Negative for suicidal ideas, sleep disturbance, self-injury and agitation. The patient is not nervous/anxious.       Objective:    BP 108/70 mmHg  Pulse 56  Temp(Src) 98.1 F (36.7 C) (Oral)  Resp 14  Ht 6\' 3"  (1.905 m)  Wt 232 lb (105.235 kg)  BMI 29.00 kg/m2  SpO2 95% Nursing note and vital signs reviewed.  Physical Exam  Constitutional: He is oriented to person, place, and time. He appears well-developed and well-nourished. No distress.  Cardiovascular: Normal rate, regular rhythm, normal heart sounds and intact distal pulses.   Pulmonary/Chest: Effort normal and breath sounds normal.  Neurological: He is alert and oriented to person, place, and time.  Skin: Skin is warm and dry.  Psychiatric: His behavior is normal. Judgment and thought content normal. He exhibits a depressed mood.       Assessment & Plan:   Problem List Items Addressed This Visit      Respiratory   Laryngitis, chronic    Continues to experience associated symptom of chronic hoarseness. Given continued symptoms advised to follow-up with ear nose and throat with referral placed. Continue GERD medication as needed with no current symptoms. Follow-up pending referral to ear nose and throat.      Relevant Orders   Ambulatory referral to ENT     Other   Depression    Symptoms of depression not currently controlled with current regimen of Wellbutrin. Denies suicidal ideations. Describes feelings of increased sadness with no acute stressors noted. Continue current dosage of Wellbutrin. Start sertraline. Follow-up in one month or sooner if needed.      Relevant Medications   buPROPion (WELLBUTRIN XL) 300 MG 24 hr tablet    sertraline (ZOLOFT) 50 MG tablet   Sleep apnea, primary central - Primary    Sleep apnea currently maintained on CPAP and was prescribed Adderall for daytime fatigue and sleepiness. Question possible narcolepsy. Express concern regarding continued use of Adderall secondary to age and increased risk of heart disease with family history. Refer to pulmonology for further management and evaluation about discontinuation of Adderall and possible alternative medication for daytime sleepiness.      Relevant Medications   amphetamine-dextroamphetamine (ADDERALL) 20 MG tablet    Other Visit Diagnoses    Encounter for routine laboratory testing        Relevant Orders    Hepatitis  C antibody

## 2015-11-03 NOTE — Progress Notes (Signed)
Pre visit review using our clinic review tool, if applicable. No additional management support is needed unless otherwise documented below in the visit note. 

## 2015-11-03 NOTE — Assessment & Plan Note (Signed)
Continues to experience associated symptom of chronic hoarseness. Given continued symptoms advised to follow-up with ear nose and throat with referral placed. Continue GERD medication as needed with no current symptoms. Follow-up pending referral to ear nose and throat.

## 2015-11-03 NOTE — Assessment & Plan Note (Signed)
Sleep apnea currently maintained on CPAP and was prescribed Adderall for daytime fatigue and sleepiness. Question possible narcolepsy. Express concern regarding continued use of Adderall secondary to age and increased risk of heart disease with family history. Refer to pulmonology for further management and evaluation about discontinuation of Adderall and possible alternative medication for daytime sleepiness.

## 2015-11-03 NOTE — Assessment & Plan Note (Signed)
Symptoms of depression not currently controlled with current regimen of Wellbutrin. Denies suicidal ideations. Describes feelings of increased sadness with no acute stressors noted. Continue current dosage of Wellbutrin. Start sertraline. Follow-up in one month or sooner if needed.

## 2015-11-04 ENCOUNTER — Encounter: Payer: Self-pay | Admitting: Family

## 2015-11-04 ENCOUNTER — Telehealth: Payer: Self-pay | Admitting: *Deleted

## 2015-11-04 LAB — HEPATITIS C ANTIBODY: HCV Ab: NEGATIVE

## 2015-11-04 MED ORDER — SILDENAFIL CITRATE 20 MG PO TABS: 20.0000 mg | ORAL_TABLET | Freq: Every day | ORAL | Status: DC | PRN

## 2015-11-04 NOTE — Telephone Encounter (Signed)
Medication sent to pharmacy  

## 2015-11-04 NOTE — Telephone Encounter (Signed)
Receive call pt states the rx Cialis will cost him $548. Pharmacy states he can change med to Sildenafil 20 mg & it will cost him on $10. Pt is requesting rx to be sent for sildenafil.../l;mb

## 2015-11-05 ENCOUNTER — Telehealth: Payer: Self-pay | Admitting: *Deleted

## 2015-11-05 DIAGNOSIS — F32A Depression, unspecified: Secondary | ICD-10-CM

## 2015-11-05 DIAGNOSIS — F329 Major depressive disorder, single episode, unspecified: Secondary | ICD-10-CM

## 2015-11-05 MED ORDER — SERTRALINE HCL 50 MG PO TABS: 50.0000 mg | ORAL_TABLET | Freq: Every day | ORAL | Status: DC

## 2015-11-05 NOTE — Telephone Encounter (Signed)
Called pt no answer LMOM w/Greg response../lmb 

## 2015-11-05 NOTE — Telephone Encounter (Signed)
Pt left msg on triage stating he has sent 2 emails regarding medications. He stated when he was at [facility] stated that he was going to change his Wellbutrin to Zoloft, but when he went to pick rx up Wellbutrin was sent in. pls advise on what pt suppose to take,,,/lmb

## 2015-11-05 NOTE — Telephone Encounter (Signed)
He was instructed to take both the Wellbutrin and the Zoloft, both of which were sent to the pharmacy. I will resend the Zoloft.

## 2015-11-05 NOTE — Telephone Encounter (Signed)
Zoloft was not sent...John Blackburn

## 2015-11-05 NOTE — Telephone Encounter (Signed)
Called pt no answer LMOM rx sent to pharmacy.../lmb 

## 2015-11-12 DIAGNOSIS — J387 Other diseases of larynx: Secondary | ICD-10-CM | POA: Diagnosis not present

## 2015-11-12 DIAGNOSIS — R490 Dysphonia: Secondary | ICD-10-CM | POA: Diagnosis not present

## 2015-11-24 ENCOUNTER — Ambulatory Visit (INDEPENDENT_AMBULATORY_CARE_PROVIDER_SITE_OTHER): Payer: PPO | Admitting: Gastroenterology

## 2015-11-24 ENCOUNTER — Encounter: Payer: Self-pay | Admitting: Gastroenterology

## 2015-11-24 VITALS — BP 126/70 | HR 60 | Ht 72.75 in | Wt 238.0 lb

## 2015-11-24 DIAGNOSIS — K5900 Constipation, unspecified: Secondary | ICD-10-CM | POA: Diagnosis not present

## 2015-11-24 NOTE — Patient Instructions (Signed)
Change metamucil so you take it EVERY day. Stay hydrated. Take miralax, one dose daily. Please return to see Dr. Ardis Hughs in 2 months.

## 2015-11-24 NOTE — Progress Notes (Signed)
Review of pertinent gastrointestinal problems: 1. FH of colon cancer, personal history of precancerous colon polyps:  Colonoscopy Dr. Verl Blalock, May 1990, done for "family history of colon cancer" examination was unremarkable, no polyps.   Colonoscopy Dr. Verl Blalock July 1996 done for "strong family history of colon cancer", no polyps were found.   Colonoscopy Dr. Verl Blalock September 2000, done for "very strong family history of colon cancer", findings were left-sided diverticulosis, no polyps, he was recommended to have repeat colonoscopy in 3-5 years and annual Hemoccult testing.   Colonoscopy Dr. Verl Blalock may 2006 done for "history of adenomatous polyps" findings left-sided diverticulosis, no polyps, recommended to have repeat colonoscopy at 5 years.   Colonoscopy reported in office note by Dr. Ferdinand Lango, "January 2009. One polyp removed from 18 cm which path showed to be tubular adenoma with low-grade dysplasia." The procedure report and path report from this 2009 procedure were not sent. At that time the patient was in Dr. Ferdinand Lango office requesting to have colonoscopy done for family history of colon cancer. Dr. Collier Salina decided to do that February 2012.   Colonoscopy Dr. Ferdinand Lango, February 2012. No polyps were found, bowel prep" inadequate". He was recommended to have repeat colonoscopy at one year with more extensive prep.   Colonoscopy June 2013, Dr. Ruthell Rummage, indications " Personal history of colon polyps, family history of colon cancer" no polyps were found, bowel prep was documented to be "inadequate"   Colonoscopy Dr. Ardis Hughs 03/2014: 2 sub CM polyps were found, were TA's, recommended recall at 5 years;  Also Genetic Councilor referral. Letter 08/2014 " Your negative result suggests you are not at a significant, increased genetic risk to develop a future cancer associated with these genes. However, given your personal and family history of cancer you should continue to  follow the cancer screening and management recommendations provided to you by your healthcare providers".  HPI: This is a   very pleasant 70 year old man whom I last saw the time of a colonoscopy in 2005, see those results summarized above  Chief complaint is rectal bleeding, constipation  Last week blood in stool and on toilet paper on one occasion and the past couple weeks he's had "horrible constipation."   Has been having to push and strain a lot.   This is in past 2-3 weeks.  He was started on metoprolol and Lasix several months ago, he was also started on Zoloft fairly recently. Drug index review does not show significant reports of constipation for many of these medicines.  He was started on a new BP med about two months ago  Tried miralax once or twice without much relief.  Takes metamucil most days for years.   Past Medical History  Diagnosis Date  . 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 Depression     on multiple meds. Has been seen at [facility]. and Dr. Sabra Heck is his prescriber  . Diverticulitis of colon 2000 's    treated as an outpatient  . GERD (gastroesophageal reflux disease)     UGI done August '12 - ulcer/. Dr. Ferdinand Lango, gastroenterologist in [facility].   . Peptic ulcer     in the past and just recently-August '12  . Hypertension   . Hyperlipidemia     last lipid panel: HDL 44, LDL 117  . Hx of adenomatous colonic polyps   . Sleep apnea, primary central     wears CPAP  . Allergy   .  Pulmonary emboli [facility])     noted October 2014 - treated by Dr. Linda Hedges  . skin cancer     skin CA  . Clotting disorder [facility])     November 2014, no longer on blood thinngers    Past Surgical History  Procedure Laterality Date  . Cholecystectomy  1990    laproscopic   . Tkr bilateral  2006    Dr. Violet Baldy  . Joint replacement      knee  . Prostate surgery      needle biopsy's, TURP  . Hernia repair    . Vasectomy    . Upper gastrointestinal  endoscopy      Current Outpatient Prescriptions  Medication Sig Dispense Refill  . amphetamine-dextroamphetamine (ADDERALL) 20 MG tablet Take 1 tablet (20 mg total) by mouth 2 (two) times daily. 60 tablet 0  . budesonide (RHINOCORT AQUA) 32 MCG/ACT nasal spray Place 2 sprays into both nostrils daily. 8.6 g 11  . buPROPion (WELLBUTRIN XL) 300 MG 24 hr tablet Take 1 tablet (300 mg total) by mouth daily. 30 tablet 2  . Cholecalciferol 1000 UNITS tablet Take by mouth.    . furosemide (LASIX) 40 MG tablet TAKE ONE TABLET BY MOUTH ONCE DAILY 30 tablet 3  . Magnesium 250 MG TABS Take by mouth.    . Melatonin 1 MG/4ML LIQD Take by mouth at bedtime.    . metoprolol tartrate (LOPRESSOR) 25 MG tablet TAKE ONE TABLET BY MOUTH TWICE DAILY 60 tablet 3  . Multiple Vitamin (MULTI-VITAMINS) TABS Take by mouth.    . sertraline (ZOLOFT) 50 MG tablet Take 1 tablet (50 mg total) by mouth daily. 30 tablet 2  . sildenafil (REVATIO) 20 MG tablet Take 1-5 tablets (20-100 mg total) by mouth daily as needed. 20 tablet 0  . tadalafil (CIALIS) 10 MG tablet Take 1 tablet (10 mg total) by mouth daily as needed for erectile dysfunction. 10 tablet 0  . Turmeric 500 MG CAPS Take by mouth.     No current facility-administered medications for this visit.    Allergies as of 11/24/2015 - Review Complete 11/24/2015  Allergen Reaction Noted  . Cephalexin Rash 05/03/2011  . Levofloxacin Rash 05/03/2011    Family History  Problem Relation Age of Onset  . Stroke Mother   . Hypertension Mother   . Heart disease Mother   . Heart disease Father     CAD/MI-fatal sudden death  . Colon cancer Father   . Colon cancer Sister     colon cancer 20090-survivor  . Colon cancer Sister   . Heart disease Paternal Uncle   . Colon cancer Paternal Uncle   . Heart disease Paternal Grandfather   . Colon cancer Paternal Aunt   . Stomach cancer Neg Hx   . Rectal cancer Neg Hx   . Esophageal cancer Neg Hx   . Colon cancer Cousin   .  Colon cancer Cousin   . Colon cancer Paternal Uncle     Social History   Social History  . Marital Status: Divorced    Spouse Name: N/A  . Number of Children: 3  . Years of Education: 16   Occupational History  . contractor    Social History Main Topics  . Smoking status: Never Smoker   . Smokeless tobacco: Never Used  . Alcohol Use: 0.5 oz/week    1 drink(s) per week     Comment: social  . Drug Use: No  . Sexual Activity:    Partners:  Female   Other Topics Concern  . 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 3 sons - '74, '76, '87.  1 grandchild.   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 No pets. Serially monogamous.   ACP - he has a living will: DNR, DNI.     Physical Exam: BP 126/70 mmHg  Pulse 60  Ht 6' 0.75" (1.848 m)  Wt 238 lb (107.956 kg)  BMI 31.61 kg/m2 Constitutional: generally well-appearing Psychiatric: alert and oriented x3 Abdomen: soft, nontender, nondistended, no obvious ascites, no peritoneal signs, normal bowel sounds   Assessment and plan: 70 y.o. male with Minor rectal bleeding, new constipation  None of his recently started medicines lists constipation as a significant, common side effect. He had a colonoscopy less than 2 years ago and I don't think that needs to be repeated just now. I recommended instead we try to focus on his constipation with more regular fiber supplement, adding daily MiraLAX to his regimen and he will try to focus on staying hydrated better. He will return to see me in the office in 2 months and if he has not had significant improvement in his constipation changes then repeat colonoscopy may be in order.   Owens Loffler, MD [facility] 11/24/2015, 9:56 AM

## 2015-12-15 ENCOUNTER — Ambulatory Visit (INDEPENDENT_AMBULATORY_CARE_PROVIDER_SITE_OTHER): Payer: PPO

## 2015-12-15 ENCOUNTER — Ambulatory Visit (INDEPENDENT_AMBULATORY_CARE_PROVIDER_SITE_OTHER): Payer: PPO | Admitting: Sports Medicine

## 2015-12-15 ENCOUNTER — Encounter: Payer: Medicare Other | Admitting: Podiatry

## 2015-12-15 ENCOUNTER — Encounter: Payer: Self-pay | Admitting: Sports Medicine

## 2015-12-15 VITALS — BP 127/71 | HR 75 | Resp 16 | Ht 73.0 in | Wt 230.0 lb

## 2015-12-15 DIAGNOSIS — M722 Plantar fascial fibromatosis: Secondary | ICD-10-CM

## 2015-12-15 DIAGNOSIS — M79672 Pain in left foot: Secondary | ICD-10-CM

## 2015-12-15 DIAGNOSIS — I739 Peripheral vascular disease, unspecified: Secondary | ICD-10-CM | POA: Diagnosis not present

## 2015-12-15 MED ORDER — TRIAMCINOLONE ACETONIDE 10 MG/ML IJ SUSP: 10.0000 mg | Freq: Once | INTRAMUSCULAR | Status: DC

## 2015-12-15 NOTE — Patient Instructions (Signed)
Plantar Fasciitis Plantar fasciitis is a painful foot condition that affects the heel. It occurs when the band of tissue that connects the toes to the heel bone (plantar fascia) becomes irritated. This can happen after exercising too much or doing other repetitive activities (overuse injury). The pain from plantar fasciitis can range from mild irritation to severe pain that makes it difficult for you to walk or move. The pain is usually worse in the morning or after you have been sitting or lying down for a while. CAUSES This condition may be caused by:  Standing for long periods of time.  Wearing shoes that do not fit.  Doing high-impact activities, including running, aerobics, and ballet.  Being overweight.  Having an abnormal way of walking (gait).  Having tight calf muscles.  Having high arches in your feet.  Starting a new athletic activity. SYMPTOMS The main symptom of this condition is heel pain. Other symptoms include:  Pain that gets worse after activity or exercise.  Pain that is worse in the morning or after resting.  Pain that goes away after you walk for a few minutes. DIAGNOSIS This condition may be diagnosed based on your signs and symptoms. Your health care provider will also do a physical exam to check for:  A tender area on the bottom of your foot.  A high arch in your foot.  Pain when you move your foot.  Difficulty moving your foot. You may also need to have imaging studies to confirm the diagnosis. These can include:  X-rays.  Ultrasound.  MRI. TREATMENT  Treatment for plantar fasciitis depends on the severity of the condition. Your treatment may include:  Rest, ice, and over-the-counter pain medicines to manage your pain.  Exercises to stretch your calves and your plantar fascia.  A splint that holds your foot in a stretched, upward position while you sleep (night splint).  Physical therapy to relieve symptoms and prevent problems in the  future.  Cortisone injections to relieve severe pain.  Extracorporeal shock wave therapy (ESWT) to stimulate damaged plantar fascia with electrical impulses. It is often used as a last resort before surgery.  Surgery, if other treatments have not worked after 12 months. HOME CARE INSTRUCTIONS  Take medicines only as directed by your health care provider.  Avoid activities that cause pain.  Roll the bottom of your foot over a bag of ice or a bottle of cold water. Do this for 20 minutes, 3-4 times a day.  Perform simple stretches as directed by your health care provider.  Try wearing athletic shoes with air-sole or gel-sole cushions or soft shoe inserts.  Wear a night splint while sleeping, if directed by your health care provider.  Keep all follow-up appointments with your health care provider. PREVENTION   Do not perform exercises or activities that cause heel pain.  Consider finding low-impact activities if you continue to have problems.  Lose weight if you need to. The best way to prevent plantar fasciitis is to avoid the activities that aggravate your plantar fascia. SEEK MEDICAL CARE IF:  Your symptoms do not go away after treatment with home care measures.  Your pain gets worse.  Your pain affects your ability to move or do your daily activities.   This information is not intended to replace advice given to you by your health care provider. Make sure you discuss any questions you have with your health care provider.   Document Released: 05/17/2001 Document Revised: 05/13/2015 Document Reviewed: 07/02/2014 Elsevier   Interactive Patient Education 2016 Elsevier Inc.  

## 2015-12-15 NOTE — Progress Notes (Signed)
Patient ID: John Blackburn, male   DOB: [birth year], 70 y.o.   MRN: [id number]   NO SHOW

## 2015-12-15 NOTE — Progress Notes (Addendum)
Patient ID: JOSGAR SIXKILLER, male   DOB: 1946/05/31, 70 y.o.   MRN: [id number] Subjective: John Blackburn is a 70 y.o. male patient presents to office with complaint of heel pain on the left. Patient admits to post static dyskinesia for 3 months in duration. Patient has treated this problem with change in shoes. Patient reports that this treatment has not worked. States that his foot and leg also seems swollen and that the spider veins are getting worse. Patient denies any other complaints.  Patient Active Problem List   Diagnosis Date Noted  . Edema 04/08/2015  . Hip pain 11/27/2014  . Genetic testing - Results Colorectal Cancer Gene Panel 08/07/2014  . Benign neoplasm of colon 03/20/2014  . Allergic rhinitis 10/15/2013  . Acute pulmonary embolism ([place]) 07/08/2013  . BPH (benign prostatic hypertrophy) 06/01/2011  . ED (erectile dysfunction) 06/01/2011  . Laryngitis, chronic 06/01/2011  . Arthritis   . Depression   . GERD (gastroesophageal reflux disease)   . Hypertension   . Hyperlipidemia   . Sleep apnea, primary central     Current Outpatient Prescriptions on File Prior to Visit  Medication Sig Dispense Refill  . amphetamine-dextroamphetamine (ADDERALL) 20 MG tablet Take 1 tablet (20 mg total) by mouth 2 (two) times daily. 60 tablet 0  . budesonide (RHINOCORT AQUA) 32 MCG/ACT nasal spray Place 2 sprays into both nostrils daily. 8.6 g 11  . buPROPion (WELLBUTRIN XL) 300 MG 24 hr tablet Take 1 tablet (300 mg total) by mouth daily. 30 tablet 2  . Cholecalciferol 1000 UNITS tablet Take by mouth.    . furosemide (LASIX) 40 MG tablet TAKE ONE TABLET BY MOUTH ONCE DAILY 30 tablet 3  . Magnesium 250 MG TABS Take by mouth.    . Melatonin 1 MG/4ML LIQD Take by mouth at bedtime.    . metoprolol tartrate (LOPRESSOR) 25 MG tablet TAKE ONE TABLET BY MOUTH TWICE DAILY 60 tablet 3  . Multiple Vitamin (MULTI-VITAMINS) TABS Take by mouth.    . sertraline (ZOLOFT) 50 MG tablet Take 1 tablet (50  mg total) by mouth daily. 30 tablet 2  . sildenafil (REVATIO) 20 MG tablet Take 1-5 tablets (20-100 mg total) by mouth daily as needed. 20 tablet 0  . tadalafil (CIALIS) 10 MG tablet Take 1 tablet (10 mg total) by mouth daily as needed for erectile dysfunction. 10 tablet 0  . Turmeric 500 MG CAPS Take by mouth.     No current facility-administered medications on file prior to visit.    Allergies  Allergen Reactions  . Cephalexin Rash  . Levofloxacin Rash    Objective: Physical Exam General: The patient is alert and oriented x3 in no acute distress.  Dermatology: Skin is warm, dry and supple bilateral lower extremities. Nails 1-10 are normal. There is no erythema, no eccymosis, no open lesions present. Integument is otherwise unremarkable.  Vascular: Dorsalis Pedis pulse and Posterior Tibial pulse are 1/4 bilateral. Capillary fill time is immediate to all digits. + varicosities bilateral with 1+ pitting edema L>R ankles.   Neurological: Grossly intact to light touch with an achilles reflex of +2/5 and a  negative Tinel's sign bilateral.  Musculoskeletal: Tenderness to palpation at the medial calcaneal tubercale and through the insertion of the plantar fascia on the left foot. No pain with compression of calcaneus bilateral. No pain with tuning fork to calcaneus bilateral. No pain with calf compression bilateral. There is decreased Ankle joint range of motion bilateral. All other joints range of  motion within normal limits bilateral. Strength 5/5 in all groups bilateral.   Xray, Left foot:  Normal osseous mineralization. Joint spaces mild narrowing at ankle. No fracture/dislocation/boney destruction. Midtarsal breach with arthritic changes, hammertoe, Calcaneal spur present with mild thickening of plantar fascia. No other soft tissue abnormalities or radiopaque foreign bodies.   Assessment and Plan: Problem List Items Addressed This Visit    None    Visit Diagnoses    Foot pain, left     -  Primary    Relevant Medications    triamcinolone acetonide (KENALOG) 10 MG/ML injection 10 mg    Other Relevant Orders    DG Foot Complete Left    Plantar fasciitis of left foot        Relevant Medications    triamcinolone acetonide (KENALOG) 10 MG/ML injection 10 mg    PVD (peripheral vascular disease) (H[facility])           -Complete examination performed. Discussed with patient in detail the condition of plantar fasciitis, how this occurs and general treatment options. Explained both conservative and surgical treatments.  -After oral consent and aseptic prep, injected a mixture containing 1 ml of 2%  plain lidocaine, 1 ml 0.5% plain marcaine, 0.5 ml of kenalog 10 and 0.5 ml of dexamethasone phosphate into left heel. Post-injection care discussed with patient.  -Recommended good supportive shoes and advised use of OTC insert. - Explained in detail the use of the fascial brace which was dispensed at today's visit. -Explained and dispensed to patient daily stretching exercises. -Recommend patient to ice affected area 1-2x daily. -Rx Venous reflux study and discussed possible need for compression stocking pending result  -Patient to return to office in 3-4 weeks for follow up or sooner if problems or questions  Arise.  Landis Martins, DPM

## 2015-12-15 NOTE — Progress Notes (Deleted)
   Subjective:    Patient ID: John Blackburn, male    DOB: 1945/09/27, 70 y.o.   MRN: [id number]  HPI    Review of Systems  All other systems reviewed and are negative.      Objective:   Physical Exam        Assessment & Plan:

## 2015-12-16 ENCOUNTER — Telehealth: Payer: Self-pay | Admitting: *Deleted

## 2015-12-16 DIAGNOSIS — M79605 Pain in left leg: Secondary | ICD-10-CM

## 2015-12-16 DIAGNOSIS — I739 Peripheral vascular disease, unspecified: Secondary | ICD-10-CM

## 2015-12-16 DIAGNOSIS — M79604 Pain in right leg: Secondary | ICD-10-CM

## 2015-12-16 DIAGNOSIS — R609 Edema, unspecified: Secondary | ICD-10-CM

## 2015-12-16 NOTE — Telephone Encounter (Addendum)
-----   Message from Landis Martins, [place] sent at 12/15/2015  3:29 PM EDT ----- Regarding: Venous reflux Order reflux study r/o PVD  Thanks Dr. Cannon Kettle  12/16/2015-Orders faxed to [facility].  Rip Harbour states can not use PVD as dx code.  12/17/2015-Melinda - [facility] states she would actually need a new order for pt.  New orders faxed to [facility] - [facility]. 01/12/2016-Dr. Cannon Kettle ordered compression hose to the knee B/L 30-53mmHg for PVD, rx written and given to pt for fitting at [facility].

## 2015-12-21 ENCOUNTER — Ambulatory Visit (HOSPITAL_COMMUNITY)
Admission: RE | Admit: 2015-12-21 | Discharge: 2015-12-21 | Disposition: A | Discharge status: Home / Self Care | Payer: PPO | Source: Ambulatory Visit | Attending: Surgery | Admitting: Surgery

## 2015-12-21 DIAGNOSIS — M79604 Pain in right leg: Secondary | ICD-10-CM | POA: Insufficient documentation

## 2015-12-21 DIAGNOSIS — R609 Edema, unspecified: Secondary | ICD-10-CM | POA: Diagnosis not present

## 2015-12-21 DIAGNOSIS — E785 Hyperlipidemia, unspecified: Secondary | ICD-10-CM | POA: Diagnosis not present

## 2015-12-21 DIAGNOSIS — I8391 Asymptomatic varicose veins of right lower extremity: Secondary | ICD-10-CM | POA: Diagnosis not present

## 2015-12-21 DIAGNOSIS — I10 Essential (primary) hypertension: Secondary | ICD-10-CM | POA: Insufficient documentation

## 2015-12-21 DIAGNOSIS — K219 Gastro-esophageal reflux disease without esophagitis: Secondary | ICD-10-CM | POA: Insufficient documentation

## 2015-12-21 DIAGNOSIS — M79605 Pain in left leg: Secondary | ICD-10-CM

## 2015-12-24 ENCOUNTER — Ambulatory Visit: Payer: PPO | Admitting: Sports Medicine

## 2016-01-12 ENCOUNTER — Ambulatory Visit (INDEPENDENT_AMBULATORY_CARE_PROVIDER_SITE_OTHER): Payer: PPO | Admitting: Sports Medicine

## 2016-01-12 ENCOUNTER — Encounter: Payer: Self-pay | Admitting: Sports Medicine

## 2016-01-12 DIAGNOSIS — M79605 Pain in left leg: Secondary | ICD-10-CM

## 2016-01-12 DIAGNOSIS — I739 Peripheral vascular disease, unspecified: Secondary | ICD-10-CM | POA: Diagnosis not present

## 2016-01-12 DIAGNOSIS — M79604 Pain in right leg: Secondary | ICD-10-CM | POA: Diagnosis not present

## 2016-01-12 DIAGNOSIS — M722 Plantar fascial fibromatosis: Secondary | ICD-10-CM

## 2016-01-12 DIAGNOSIS — M2041 Other hammer toe(s) (acquired), right foot: Secondary | ICD-10-CM

## 2016-01-12 DIAGNOSIS — R609 Edema, unspecified: Secondary | ICD-10-CM

## 2016-01-12 NOTE — Patient Instructions (Signed)

## 2016-01-12 NOTE — Progress Notes (Addendum)
Patient ID: John Blackburn, male   DOB: 14-Feb-1946, 70 y.o.   MRN: [id number] Subjective: John Blackburn is a 70 y.o. male returns to office for follow up evaluation after Left heel injection for plantar fasciitis, injection #1 administered 4 weeks ago. Patient states that the injection seems to help his pain; pain is resolved. Admits that he still has swell and has completed venous reflux test. Also admits to increasing irration over right 2nd hammertoe using silicone crest pad but desires a new pad to go over right 2nd toe. Patient denies any recent changes in medications or new problems since last visit.   Patient Active Problem List   Diagnosis Date Noted  . Edema 04/08/2015  . Hip pain 11/27/2014  . Genetic testing - Results Colorectal Cancer Gene Panel 08/07/2014  . Benign neoplasm of colon 03/20/2014  . Allergic rhinitis 10/15/2013  . Acute pulmonary embolism ([place]) 07/08/2013  . BPH (benign prostatic hypertrophy) 06/01/2011  . ED (erectile dysfunction) 06/01/2011  . Laryngitis, chronic 06/01/2011  . Arthritis   . Depression   . GERD (gastroesophageal reflux disease)   . Hypertension   . Hyperlipidemia   . Sleep apnea, primary central     Current Outpatient Prescriptions on File Prior to Visit  Medication Sig Dispense Refill  . amphetamine-dextroamphetamine (ADDERALL) 20 MG tablet Take 1 tablet (20 mg total) by mouth 2 (two) times daily. 60 tablet 0  . budesonide (RHINOCORT AQUA) 32 MCG/ACT nasal spray Place 2 sprays into both nostrils daily. 8.6 g 11  . buPROPion (WELLBUTRIN XL) 300 MG 24 hr tablet Take 1 tablet (300 mg total) by mouth daily. 30 tablet 2  . Cholecalciferol 1000 UNITS tablet Take by mouth.    . furosemide (LASIX) 40 MG tablet TAKE ONE TABLET BY MOUTH ONCE DAILY 30 tablet 3  . Magnesium 250 MG TABS Take by mouth.    . Melatonin 1 MG/4ML LIQD Take by mouth at bedtime.    . metoprolol tartrate (LOPRESSOR) 25 MG tablet TAKE ONE TABLET BY MOUTH TWICE DAILY 60  tablet 3  . Multiple Vitamin (MULTI-VITAMINS) TABS Take by mouth.    . sertraline (ZOLOFT) 50 MG tablet Take 1 tablet (50 mg total) by mouth daily. 30 tablet 2  . sildenafil (REVATIO) 20 MG tablet Take 1-5 tablets (20-100 mg total) by mouth daily as needed. 20 tablet 0  . tadalafil (CIALIS) 10 MG tablet Take 1 tablet (10 mg total) by mouth daily as needed for erectile dysfunction. 10 tablet 0  . Turmeric 500 MG CAPS Take by mouth.     Current Facility-Administered Medications on File Prior to Visit  Medication Dose Route Frequency Provider Last Rate Last Dose  . triamcinolone acetonide (KENALOG) 10 MG/ML injection 10 mg  10 mg Other Once Landis Martins, DPM        Allergies  Allergen Reactions  . Cephalexin Rash  . Levofloxacin Rash    Objective:   General:  Alert and oriented x 3, in no acute distress  Dermatology: Skin is warm, dry, and supple bilateral. Nails are within normal limits. There is no lower extremity erythema, no eccymosis, no open lesions present bilateral.   Vascular: Dorsalis Pedis and Posterior Tibial pedal pulses are 1/4 bilateral. + hair growth noted bilateral. Capillary Fill Time is 3 seconds in all digits. + varicosities, + edema bilateral lower extremities L>R.   Neurological: Sensation grossly intact to light touch with an achilles reflex of +2 and a  negative Tinel's sign bilateral. Vibratory,  sharp/dull, Semmes Weinstein Monofilament within normal limits.   Musculoskeletal: Mild hammertoe R>L at 2nd toe. There is no tenderness to palpation at the medial calcaneal tubercale and through the insertion of the plantar fascia on the Left foot. No pain with compression to calcaneus or application of tuning fork. There is decreased Ankle joint range of motion bilateral. All other joints range of motion  within normal limits bilateral. Strength 5/5 bilateral.   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  -Venous reflux study reviewed -Rx Compression Juxta lite 30-40 mmhg stocking from Blue with stretching, icing, good supportive shoes, inserts daily to prevent recurrence of left plantar fasciitis  -Discussed long term care and reocurrence; will closely monitor; if fails to improve will consider other treatment modalities.  -Gave silicone toe cap and encouraged wider shoes for hammertoe -Patient to return to office in 3 months for follow up or sooner if problems or questions arise.  Landis Martins, DPM

## 2016-01-15 ENCOUNTER — Telehealth: Payer: Self-pay | Admitting: Internal Medicine

## 2016-01-15 NOTE — Telephone Encounter (Signed)
Spoke to patient and scheduled an office visit.

## 2016-01-15 NOTE — Telephone Encounter (Signed)
Pt called in and said that his bp keeps going up and up on this new bp med.  Amy he would like to go back to other med he was taking.  Can you call him when you get a chance?

## 2016-01-18 ENCOUNTER — Ambulatory Visit (INDEPENDENT_AMBULATORY_CARE_PROVIDER_SITE_OTHER): Payer: PPO | Admitting: Family

## 2016-01-18 ENCOUNTER — Encounter: Payer: Self-pay | Admitting: Family

## 2016-01-18 VITALS — BP 118/82 | HR 58 | Temp 97.8°F | Resp 14 | Ht 75.0 in | Wt 237.0 lb

## 2016-01-18 DIAGNOSIS — R609 Edema, unspecified: Secondary | ICD-10-CM | POA: Diagnosis not present

## 2016-01-18 DIAGNOSIS — M25473 Effusion, unspecified ankle: Secondary | ICD-10-CM

## 2016-01-18 DIAGNOSIS — J309 Allergic rhinitis, unspecified: Secondary | ICD-10-CM

## 2016-01-18 DIAGNOSIS — I10 Essential (primary) hypertension: Secondary | ICD-10-CM

## 2016-01-18 MED ORDER — FLUTICASONE PROPIONATE 50 MCG/ACT NA SUSP: 2.0000 | Freq: Every day | NASAL | Status: DC

## 2016-01-18 NOTE — Progress Notes (Signed)
Subjective:    Patient ID: John Blackburn, male    DOB: 12/06/1945, 70 y.o.   MRN: [id number]   John Blackburn is a 70 y.o. male who presents today for an acute visit.    HPI Comments: Patient here for evaluation of blood pressure at home which has been running 140/80.  Patient takes his metoprolol daily. He would like to go back on atenolol which he stopped 3 months ago due to LE swelling.  Endorses bilateral ankle swelling. Left ankle worse. Hasnt noticed if they are better in morning. Has compression hose but does not often wear them. Does not often elevate his legs either. History of DVT. Not on anticoagulation.  He also complains of dry cough, postnasal drip. He has seasonal allergies.  Past Medical History  Diagnosis Date  . 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 Depression     on multiple meds. Has been seen at [facility]. and Dr. Sabra Heck is his prescriber  . Diverticulitis of colon 2000 's    treated as an outpatient  . GERD (gastroesophageal reflux disease)     UGI done August '12 - ulcer/. Dr. Ferdinand Lango, gastroenterologist in [facility].   . Peptic ulcer     in the past and just recently-August '12  . Hypertension   . Hyperlipidemia     last lipid panel: HDL 44, LDL 117  . Hx of adenomatous colonic polyps   . Sleep apnea, primary central     wears CPAP  . Allergy   . Pulmonary emboli [facility])     noted October 2014 - treated by Dr. Linda Hedges  . skin cancer     skin CA  . Clotting disorder [facility])     November 2014, no longer on blood thinngers   Allergies: Cephalexin and Levofloxacin Current Outpatient Prescriptions on File Prior to Visit  Medication Sig Dispense Refill  . buPROPion (WELLBUTRIN XL) 300 MG 24 hr tablet Take 1 tablet (300 mg total) by mouth daily. 30 tablet 2  . Cholecalciferol 1000 UNITS tablet Take by mouth.    . furosemide (LASIX) 40 MG tablet TAKE ONE TABLET BY MOUTH ONCE DAILY 30 tablet 3  . Magnesium 250 MG TABS Take by mouth.     . Melatonin 1 MG/4ML LIQD Take by mouth at bedtime.    . metoprolol tartrate (LOPRESSOR) 25 MG tablet TAKE ONE TABLET BY MOUTH TWICE DAILY 60 tablet 3  . Multiple Vitamin (MULTI-VITAMINS) TABS Take by mouth.    . sertraline (ZOLOFT) 50 MG tablet Take 1 tablet (50 mg total) by mouth daily. 30 tablet 2  . sildenafil (REVATIO) 20 MG tablet Take 1-5 tablets (20-100 mg total) by mouth daily as needed. 20 tablet 0  . Turmeric 500 MG CAPS Take by mouth.    [name] amphetamine-dextroamphetamine (ADDERALL) 20 MG tablet Take 1 tablet (20 mg total) by mouth 2 (two) times daily. (Patient not taking: Reported on 01/18/2016) 60 tablet 0  . budesonide (RHINOCORT AQUA) 32 MCG/ACT nasal spray Place 2 sprays into both nostrils daily. (Patient not taking: Reported on 01/18/2016) 8.6 g 11  . tadalafil (CIALIS) 10 MG tablet Take 1 tablet (10 mg total) by mouth daily as needed for erectile dysfunction. (Patient not taking: Reported on 01/18/2016) 10 tablet 0   Current Facility-Administered Medications on File Prior to Visit  Medication Dose Route Frequency Provider Last Rate Last Dose  . triamcinolone acetonide (KENALOG) 10 MG/ML injection 10 mg  10 mg Other Once Landis Martins, DPM        Social History  Substance Use Topics  . Smoking status: Never Smoker   . Smokeless tobacco: Never Used  . Alcohol Use: 0.5 oz/week    1 drink(s) per week     Comment: social    Review of Systems  Constitutional: Negative for fever and chills.  HENT: Positive for postnasal drip and rhinorrhea.   Eyes: Negative for visual disturbance.  Respiratory: Positive for cough (dry, occasional). Negative for shortness of breath and wheezing.        Denies orthopnea  Cardiovascular: Positive for leg swelling. Negative for chest pain and palpitations.  Gastrointestinal: Negative for nausea and vomiting.  Neurological: Negative for dizziness, weakness and headaches.      Objective:    BP 118/82 mmHg  Pulse 58  Temp(Src) 97.8 F (36.6  C) (Oral)  Resp 14  Ht 6\' 3"  (1.905 m)  Wt 237 lb (107.502 kg)  BMI 29.62 kg/m2  SpO2 95%   Physical Exam  Constitutional: He appears well-developed and well-nourished.  HENT:  Head: Normocephalic and atraumatic.  Right Ear: Hearing, tympanic membrane, external ear and ear canal normal. No drainage, swelling or tenderness. Tympanic membrane is not injected, not erythematous and not bulging. No middle ear effusion. No decreased hearing is noted.  Left Ear: Hearing, tympanic membrane, external ear and ear canal normal. No drainage, swelling or tenderness. Tympanic membrane is not injected, not erythematous and not bulging.  No middle ear effusion. No decreased hearing is noted.  Nose: Rhinorrhea present. Right sinus exhibits no maxillary sinus tenderness and no frontal sinus tenderness. Left sinus exhibits no maxillary sinus tenderness and no frontal sinus tenderness.  Mouth/Throat: Uvula is midline and mucous membranes are normal. Posterior oropharyngeal erythema present. No oropharyngeal exudate, posterior oropharyngeal edema or tonsillar abscesses.  Eyes: Conjunctivae, EOM and lids are normal. Pupils are equal, round, and reactive to light. Lids are everted and swept, no foreign bodies found.  Normal fundus bilaterally   Cardiovascular: Regular rhythm and normal heart sounds.   Bilateral LE edema ,non pitting. +1 left ankle. Trace right ankle swelling.  No palpable cords or masses of calves. No erythema or increased warmth of calves. Negative Homan sign bilaterally.  LE hair growth symmetrically diminished. No discoloration of varicosities noted. LE warm and palpable pedal pulses.   Pulmonary/Chest: Effort normal and breath sounds normal. No respiratory distress. He has no wheezes. He has no rhonchi. He has no rales.  Lymphadenopathy:       Head (right side): No submental, no submandibular, no tonsillar, no preauricular, no posterior auricular and no occipital adenopathy present.        Head (left side): No submental, no submandibular, no tonsillar, no preauricular, no posterior auricular and no occipital adenopathy present.    He has no cervical adenopathy.  Neurological: He is alert. He has normal strength. No cranial nerve deficit or sensory deficit. He displays a negative Romberg sign.  Reflex Scores:      Bicep reflexes are 2+ on the right side and 2+ on the left side.      Patellar reflexes are 2+ on the right side and 2+ on the left side. Grip equal and strong bilateral upper extremities. Gait strong and steady. Able to perform  finger-to-nose without difficulty.   Skin: Skin is warm and dry.  Psychiatric: He has a normal mood and affect. His speech is normal and behavior is normal.  Vitals reviewed.  Assessment & Plan:   1. Essential hypertension Normotensive today. Reassured by normal neurologic exam. No signs or symptoms of hypertensive urgency or emergency at this time.Patient is going to come back with his blood pressure cuff to recalibrate in our office.  2. Allergic rhinitis, unspecified allergic rhinitis type Postnasal drip. - fluticasone (FLONASE) 50 MCG/ACT nasal spray; Place 2 sprays into both nostrils daily.  Dispense: 16 g; Refill: 2  3. Ankle edema, chronic Patient and I agreed on conservative therapy at this time wiht elevation of legs and better use of compression hose. No evidence of DVT.    I am having Mr. Swann maintain his Melatonin, Cholecalciferol, Magnesium, Turmeric, MULTI-VITAMINS, budesonide, furosemide, metoprolol tartrate, buPROPion, amphetamine-dextroamphetamine, tadalafil, sildenafil, and sertraline. We will continue to administer triamcinolone acetonide.   No orders of the defined types were placed in this encounter.     Start medications as prescribed and explained to patient on After Visit Summary ( AVS). Risks, benefits, and alternatives of the medications and treatment plan prescribed today were discussed, and patient  expressed understanding.   Education regarding symptom management and diagnosis given to patient.   Follow-up:Plan follow-up as discussed or as needed if any worsening symptoms or change in condition.   Continue to follow with Hoyt Koch, MD for routine health maintenance.   [name] and I agreed with plan.   Mable Paris, FNP

## 2016-01-18 NOTE — Progress Notes (Signed)
Pre visit review using our clinic review tool, if applicable. No additional management support is needed unless otherwise documented below in the visit note. 

## 2016-01-18 NOTE — Patient Instructions (Signed)
Schedule f/u with PCP.   Nurse visit to calibrate BP monitor.   Elevate legs; compression hoses.   If there is no improvement in your symptoms, or if there is any worsening of symptoms, or if you have any additional concerns, please return for re-evaluation; or, if we are closed, consider going to the Emergency Room for evaluation if symptoms urgent.   Peripheral Edema You have swelling in your legs (peripheral edema). This swelling is due to excess accumulation of salt and water in your body. Edema may be a sign of heart, kidney or liver disease, or a side effect of a medication. It may also be due to problems in the leg veins. Elevating your legs and using special support stockings may be very helpful, if the cause of the swelling is due to poor venous circulation. Avoid long periods of standing, whatever the cause. Treatment of edema depends on identifying the cause. Chips, pretzels, pickles and other salty foods should be avoided. Restricting salt in your diet is almost always needed. Water pills (diuretics) are often used to remove the excess salt and water from your body via urine. These medicines prevent the kidney from reabsorbing sodium. This increases urine flow. Diuretic treatment may also result in lowering of potassium levels in your body. Potassium supplements may be needed if you have to use diuretics daily. Daily weights can help you keep track of your progress in clearing your edema. You should call your caregiver for follow up care as recommended. SEEK IMMEDIATE MEDICAL CARE IF:   You have increased swelling, pain, redness, or heat in your legs.  You develop shortness of breath, especially when lying down.  You develop chest or abdominal pain, weakness, or fainting.  You have a fever.   This information is not intended to replace advice given to you by your health care provider. Make sure you discuss any questions you have with your health care provider.   Document  Released: 09/29/2004 Document Revised: 11/14/2011 Document Reviewed: 03/04/2015 Elsevier Interactive Patient Education [employer].

## 2016-01-19 ENCOUNTER — Ambulatory Visit: Payer: PPO

## 2016-01-19 VITALS — BP 122/70

## 2016-01-19 DIAGNOSIS — I10 Essential (primary) hypertension: Secondary | ICD-10-CM

## 2016-01-19 NOTE — Progress Notes (Signed)
Pt came in to check home BP calibration against manual office BP machine.  Office BP measured 122/70, home BP (wrist cuff) measured 130/74.  Pt advised that home machine is 3+ years old. Pt was advised that automatic, wrist BP readers that are 2 years or older decrease in reliability. Pt agreed and also advised that home reading fluctuate and are frequently higher than office or above-elbow BP reading done in local pharmacies.  Pt will purchase new BP machine and follow up with PCP as needed.

## 2016-02-09 ENCOUNTER — Ambulatory Visit: Payer: PPO | Admitting: Internal Medicine

## 2016-03-09 ENCOUNTER — Other Ambulatory Visit: Payer: Self-pay | Admitting: Internal Medicine

## 2016-03-24 DIAGNOSIS — G4710 Hypersomnia, unspecified: Secondary | ICD-10-CM | POA: Diagnosis not present

## 2016-03-24 DIAGNOSIS — Z72821 Inadequate sleep hygiene: Secondary | ICD-10-CM | POA: Diagnosis not present

## 2016-03-24 DIAGNOSIS — G4733 Obstructive sleep apnea (adult) (pediatric): Secondary | ICD-10-CM | POA: Diagnosis not present

## 2016-03-29 ENCOUNTER — Ambulatory Visit (INDEPENDENT_AMBULATORY_CARE_PROVIDER_SITE_OTHER): Payer: PPO | Admitting: Sports Medicine

## 2016-03-29 ENCOUNTER — Encounter: Payer: Self-pay | Admitting: Sports Medicine

## 2016-03-29 DIAGNOSIS — R609 Edema, unspecified: Secondary | ICD-10-CM

## 2016-03-29 DIAGNOSIS — M79605 Pain in left leg: Secondary | ICD-10-CM

## 2016-03-29 DIAGNOSIS — R2681 Unsteadiness on feet: Secondary | ICD-10-CM

## 2016-03-29 DIAGNOSIS — I739 Peripheral vascular disease, unspecified: Secondary | ICD-10-CM

## 2016-03-29 DIAGNOSIS — M722 Plantar fascial fibromatosis: Secondary | ICD-10-CM | POA: Diagnosis not present

## 2016-03-29 DIAGNOSIS — M79604 Pain in right leg: Secondary | ICD-10-CM

## 2016-03-29 NOTE — Patient Instructions (Signed)
Icy hot with Lidocaine

## 2016-03-29 NOTE — Progress Notes (Signed)
Patient ID: John Blackburn, male   DOB: Feb 16, 1946, 70 y.o.   MRN: [id number] Subjective: John Blackburn is a 70 y.o. male returns to office for follow up evaluation of lower extremity swelling and left heel pain. Patient reports that left heel pain remains resolved and states that his new compression stockings He wears some of the time. Denies any pedal complaints. He admits to a history of trip and fall with the last fall 3 weeks ago.  Patient denies any recent changes in medications or new problems since last visit.   Patient Active Problem List   Diagnosis Date Noted  . Edema 04/08/2015  . Hip pain 11/27/2014  . Genetic testing - Results Colorectal Cancer Gene Panel 08/07/2014  . Benign neoplasm of colon 03/20/2014  . Allergic rhinitis 10/15/2013  . Acute pulmonary embolism ([place]) 07/08/2013  . BPH (benign prostatic hypertrophy) 06/01/2011  . ED (erectile dysfunction) 06/01/2011  . Laryngitis, chronic 06/01/2011  . Arthritis   . Depression   . GERD (gastroesophageal reflux disease)   . Hypertension   . Hyperlipidemia   . Sleep apnea, primary central     Current Outpatient Prescriptions on File Prior to Visit  Medication Sig Dispense Refill  . amphetamine-dextroamphetamine (ADDERALL) 20 MG tablet Take 1 tablet (20 mg total) by mouth 2 (two) times daily. (Patient not taking: Reported on 01/18/2016) 60 tablet 0  . budesonide (RHINOCORT AQUA) 32 MCG/ACT nasal spray Place 2 sprays into both nostrils daily. (Patient not taking: Reported on 01/18/2016) 8.6 g 11  . buPROPion (WELLBUTRIN XL) 300 MG 24 hr tablet Take 1 tablet (300 mg total) by mouth daily. 30 tablet 2  . Cholecalciferol 1000 UNITS tablet Take by mouth.    . fluticasone (FLONASE) 50 MCG/ACT nasal spray Place 2 sprays into both nostrils daily. 16 g 2  . furosemide (LASIX) 40 MG tablet TAKE ONE TABLET BY MOUTH ONCE DAILY 30 tablet 0  . Magnesium 250 MG TABS Take by mouth.    . Melatonin 1 MG/4ML LIQD Take by mouth at bedtime.     . metoprolol tartrate (LOPRESSOR) 25 MG tablet TAKE ONE TABLET BY MOUTH TWICE DAILY 60 tablet 3  . Multiple Vitamin (MULTI-VITAMINS) TABS Take by mouth.    . sertraline (ZOLOFT) 50 MG tablet Take 1 tablet (50 mg total) by mouth daily. 30 tablet 2  . sildenafil (REVATIO) 20 MG tablet Take 1-5 tablets (20-100 mg total) by mouth daily as needed. 20 tablet 0  . tadalafil (CIALIS) 10 MG tablet Take 1 tablet (10 mg total) by mouth daily as needed for erectile dysfunction. (Patient not taking: Reported on 01/18/2016) 10 tablet 0  . Turmeric 500 MG CAPS Take by mouth.     Current Facility-Administered Medications on File Prior to Visit  Medication Dose Route Frequency Provider Last Rate Last Dose  . triamcinolone acetonide (KENALOG) 10 MG/ML injection 10 mg  10 mg Other Once Landis Martins, DPM        Allergies  Allergen Reactions  . Cephalexin Rash  . Levofloxacin Rash    Objective:   General:  Alert and oriented x 3, in no acute distress  Dermatology: Skin is warm, dry, and supple bilateral. Nails are within normal limits. There is no lower extremity erythema, no eccymosis, no open lesions present bilateral.   Vascular: Dorsalis Pedis and Posterior Tibial pedal pulses are 1/4 bilateral. + hair growth noted bilateral. Capillary Fill Time is 3 seconds in all digits. + varicosities, + Pitting 1+ edema bilateral  lower extremities L>R.   Neurological: Sensation grossly intact to light touch with an achilles reflex of +2 and a  negative Tinel's sign bilateral. Vibratory, sharp/dull, Semmes Weinstein Monofilament within normal limits.   Musculoskeletal: Mild hammertoe R>L at 2nd toe. There is no tenderness to palpation at the medial calcaneal tubercale and through the insertion of the plantar fascia on the Left foot. No pain with compression to calcaneus or application of tuning fork. There is decreased Ankle joint range of motion bilateral. All other joints range of motion  within normal limits  bilateral. Strength 5/5 bilateral.   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  -Continue with Compression stocking Juxta lite 30-40 mmhg stocking from [place] with stretching, icing, good supportive shoes, inserts daily to prevent recurrence of left plantar fasciitis  -Discussed long term care and reocurrence; will closely monitor; if fails to improve will consider other treatment modalities.  -Continue with silicone toe cap and encouraged wider shoes for hammertoe -Recommend Moore balance brace; patient to call office for appointment with [facility] when he is ready to have this brace fitted and made. -Patient to return to office as needed for follow up or sooner if problems or questions arise.  Landis Martins, DPM

## 2016-03-30 ENCOUNTER — Ambulatory Visit: Payer: PPO | Admitting: Internal Medicine

## 2016-04-01 ENCOUNTER — Encounter: Payer: Self-pay | Admitting: Internal Medicine

## 2016-04-01 ENCOUNTER — Other Ambulatory Visit (INDEPENDENT_AMBULATORY_CARE_PROVIDER_SITE_OTHER): Payer: PPO

## 2016-04-01 ENCOUNTER — Ambulatory Visit (INDEPENDENT_AMBULATORY_CARE_PROVIDER_SITE_OTHER): Payer: PPO | Admitting: Internal Medicine

## 2016-04-01 VITALS — BP 162/84 | HR 66 | Temp 98.3°F | Resp 16 | Ht 74.0 in | Wt 237.0 lb

## 2016-04-01 DIAGNOSIS — I10 Essential (primary) hypertension: Secondary | ICD-10-CM

## 2016-04-01 DIAGNOSIS — Z23 Encounter for immunization: Secondary | ICD-10-CM

## 2016-04-01 LAB — LIPID PANEL
CHOLESTEROL: 187 mg/dL (ref 0–200)
HDL: 35.5 mg/dL — AB (ref >39.00)
LDL Cholesterol: 128 mg/dL — ABNORMAL HIGH (ref 0–99)
NonHDL: 151.15
TRIGLYCERIDES: 118 mg/dL (ref 0.0–149.0)
Total CHOL/HDL Ratio: 5
VLDL: 23.6 mg/dL (ref 0.0–40.0)

## 2016-04-01 LAB — CBC
HEMATOCRIT: 44 % (ref 39.0–52.0)
HEMOGLOBIN: 14.8 g/dL (ref 13.0–17.0)
MCHC: 33.6 g/dL (ref 30.0–36.0)
MCV: 95.8 fl (ref 78.0–100.0)
PLATELETS: 253 10*3/uL (ref 150.0–400.0)
RBC: 4.59 Mil/uL (ref 4.22–5.81)
RDW: 14.1 % (ref 11.5–15.5)
WBC: 7.4 10*3/uL (ref 4.0–10.5)

## 2016-04-01 LAB — COMPREHENSIVE METABOLIC PANEL
ALBUMIN: 4.1 g/dL (ref 3.5–5.2)
ALK PHOS: 72 U/L (ref 39–117)
ALT: 12 U/L (ref 0–53)
AST: 15 U/L (ref 0–37)
BILIRUBIN TOTAL: 0.6 mg/dL (ref 0.2–1.2)
BUN: 10 mg/dL (ref 6–23)
CALCIUM: 9.8 mg/dL (ref 8.4–10.5)
CO2: 34 mEq/L — ABNORMAL HIGH (ref 19–32)
Chloride: 102 mEq/L (ref 96–112)
Creatinine, Ser: 0.85 mg/dL (ref 0.40–1.50)
GFR: 94.7 mL/min (ref >60.00)
Glucose, Bld: 112 mg/dL — ABNORMAL HIGH (ref 70–99)
POTASSIUM: 4.2 meq/L (ref 3.5–5.1)
Sodium: 139 mEq/L (ref 135–145)
TOTAL PROTEIN: 7.6 g/dL (ref 6.0–8.3)

## 2016-04-01 MED ORDER — ATENOLOL-CHLORTHALIDONE 50-25 MG PO TABS: 1.0000 | ORAL_TABLET | Freq: Every day | ORAL | 3 refills | Status: DC

## 2016-04-01 NOTE — Patient Instructions (Addendum)
We have sent in the atenolol-chlorthalidone to the pharmacy.   DASH Eating Plan DASH stands for "Dietary Approaches to Stop Hypertension." The DASH eating plan is a healthy eating plan that has been shown to reduce high blood pressure (hypertension). Additional health benefits may include reducing the risk of type 2 diabetes mellitus, heart disease, and stroke. The DASH eating plan may also help with weight loss. WHAT DO I NEED TO KNOW ABOUT THE DASH EATING PLAN? For the DASH eating plan, you will follow these general guidelines:  Choose foods with a percent daily value for sodium of less than 5% (as listed on the food label).  Use salt-free seasonings or herbs instead of table salt or sea salt.  Check with your health care provider or pharmacist before using salt substitutes.  Eat lower-sodium products, often labeled as "lower sodium" or "no salt added."  Eat fresh foods.  Eat more vegetables, fruits, and low-fat dairy products.  Choose whole grains. Look for the word "whole" as the first word in the ingredient list.  Choose fish and skinless chicken or [place] more often than red meat. Limit fish, poultry, and meat to 6 oz (170 g) each day.  Limit sweets, desserts, sugars, and sugary drinks.  Choose heart-healthy fats.  Limit cheese to 1 oz (28 g) per day.  Eat more home-cooked food and less restaurant, buffet, and fast food.  Limit fried foods.  Cook foods using methods other than frying.  Limit canned vegetables. If you do use them, rinse them well to decrease the sodium.  When eating at a restaurant, ask that your food be prepared with less salt, or no salt if possible. WHAT FOODS CAN I EAT? Seek help from a dietitian for individual calorie needs. Grains Whole grain or whole wheat bread. Brown rice. Whole grain or whole wheat pasta. Quinoa, bulgur, and whole grain cereals. Low-sodium cereals. Corn or whole wheat flour tortillas. Whole grain cornbread. Whole grain  crackers. Low-sodium crackers. Vegetables Fresh or frozen vegetables (raw, steamed, roasted, or grilled). Low-sodium or reduced-sodium tomato and vegetable juices. Low-sodium or reduced-sodium tomato sauce and paste. Low-sodium or reduced-sodium canned vegetables.  Fruits All fresh, canned (in natural juice), or frozen fruits. Meat and Other Protein Products Ground beef (85% or leaner), grass-fed beef, or beef trimmed of fat. Skinless chicken or [place]. Ground chicken or [place]. Pork trimmed of fat. All fish and seafood. Eggs. Dried beans, peas, or lentils. Unsalted nuts and seeds. Unsalted canned beans. Dairy Low-fat dairy products, such as skim or 1% milk, 2% or reduced-fat cheeses, low-fat ricotta or cottage cheese, or plain low-fat yogurt. Low-sodium or reduced-sodium cheeses. Fats and Oils Tub margarines without trans fats. Light or reduced-fat mayonnaise and salad dressings (reduced sodium). Avocado. Safflower, olive, or canola oils. Natural peanut or almond butter. Other Unsalted popcorn and pretzels. The items listed above may not be a complete list of recommended foods or beverages. Contact your dietitian for more options. WHAT FOODS ARE NOT RECOMMENDED? Grains White bread. White pasta. White rice. Refined cornbread. Bagels and croissants. Crackers that contain trans fat. Vegetables Creamed or fried vegetables. Vegetables in a cheese sauce. Regular canned vegetables. Regular canned tomato sauce and paste. Regular tomato and vegetable juices. Fruits Dried fruits. Canned fruit in light or heavy syrup. Fruit juice. Meat and Other Protein Products Fatty cuts of meat. Ribs, chicken wings, bacon, sausage, bologna, salami, chitterlings, fatback, hot dogs, bratwurst, and packaged luncheon meats. Salted nuts and seeds. Canned beans with salt. Dairy Whole or 2%  milk, cream, half-and-half, and cream cheese. Whole-fat or sweetened yogurt. Full-fat cheeses or blue cheese. Nondairy creamers and  whipped toppings. Processed cheese, cheese spreads, or cheese curds. Condiments Onion and garlic salt, seasoned salt, table salt, and sea salt. Canned and packaged gravies. Worcestershire sauce. Tartar sauce. Barbecue sauce. Teriyaki sauce. Soy sauce, including reduced sodium. Steak sauce. Fish sauce. Oyster sauce. Cocktail sauce. Horseradish. Ketchup and mustard. Meat flavorings and tenderizers. Bouillon cubes. Hot sauce. Tabasco sauce. Marinades. Taco seasonings. Relishes. Fats and Oils Butter, stick margarine, lard, shortening, ghee, and bacon fat. Coconut, palm kernel, or palm oils. Regular salad dressings. Other Pickles and olives. Salted popcorn and pretzels. The items listed above may not be a complete list of foods and beverages to avoid. Contact your dietitian for more information. WHERE CAN I FIND MORE INFORMATION? National Heart, Lung, and Blood Institute: travelstabloid.com   This information is not intended to replace advice given to you by your health care provider. Make sure you discuss any questions you have with your health care provider.   Document Released: 08/11/2011 Document Revised: 09/12/2014 Document Reviewed: 06/26/2013 Elsevier Interactive Patient Education [employer].

## 2016-04-01 NOTE — Assessment & Plan Note (Signed)
BP slightly above goal today and will change back to the atenolol-chlorthalidone today and stop his metoprolol and lasix. Checking CMP and lipid panel today.

## 2016-04-01 NOTE — Progress Notes (Signed)
   Subjective:    Patient ID: John Blackburn, male    DOB: February 25, 1946, 70 y.o.   MRN: [id number]  HPI The patient is a 70 YO man coming in for follow up of his blood pressure. Since our last visit other doctors at this office have changed his medications from 1 pill daily to now 3 pills daily. They are very similar medicines to what he used to take. He does not like them as well and they are keeping his blood pressure some higher. Denies fevers or chills. No headaches or chest pains.   Review of Systems  Constitutional: Negative for activity change, appetite change, fatigue, fever and unexpected weight change.  Respiratory: Negative for cough, chest tightness, shortness of breath and wheezing.   Cardiovascular: Negative for chest pain, palpitations and leg swelling.  Gastrointestinal: Negative for abdominal distention, abdominal pain, constipation and diarrhea.  Musculoskeletal: Negative.   Neurological: Negative.   Psychiatric/Behavioral: Negative.       Objective:   Physical Exam  Constitutional: He is oriented to person, place, and time. He appears well-developed and well-nourished.  HENT:  Head: Normocephalic and atraumatic.  Eyes: EOM are normal.  Neck: Normal range of motion.  Cardiovascular: Normal rate and regular rhythm.   Pulmonary/Chest: Effort normal and breath sounds normal. No respiratory distress. He has no wheezes. He has no rales. He exhibits no tenderness.  Abdominal: Soft. Bowel sounds are normal. He exhibits no distension. There is no tenderness. There is no rebound.  Musculoskeletal: He exhibits no edema.  Neurological: He is alert and oriented to person, place, and time. Coordination normal.  Skin: Skin is warm and dry.   Vitals:   04/01/16 1316  BP: (!) 162/84  Pulse: 66  Resp: 16  Temp: 98.3 F (36.8 C)  TempSrc: Oral  SpO2: 98%  Weight: 237 lb (107.5 kg)  Height: 6\' 2"  (1.88 m)      Assessment & Plan:

## 2016-04-01 NOTE — Addendum Note (Signed)
Addended by: Resa Miner R on: 04/01/2016 01:52 PM   Modules accepted: Orders

## 2016-04-01 NOTE — Progress Notes (Signed)
Pre visit review using our clinic review tool, if applicable. No additional management support is needed unless otherwise documented below in the visit note. 

## 2016-04-02 ENCOUNTER — Other Ambulatory Visit: Payer: Self-pay | Admitting: Family

## 2016-04-02 DIAGNOSIS — F329 Major depressive disorder, single episode, unspecified: Secondary | ICD-10-CM

## 2016-04-02 DIAGNOSIS — F32A Depression, unspecified: Secondary | ICD-10-CM

## 2016-04-08 DIAGNOSIS — Z0000 Encounter for general adult medical examination without abnormal findings: Secondary | ICD-10-CM | POA: Diagnosis not present

## 2016-04-26 ENCOUNTER — Telehealth: Payer: Self-pay | Admitting: Sports Medicine

## 2016-04-26 ENCOUNTER — Telehealth: Payer: Self-pay | Admitting: *Deleted

## 2016-04-26 NOTE — Telephone Encounter (Signed)
I received a message that patient thought I was calling him and asked that he be given a call back. I called the patient to see how I could help and he stated he has an appointment with the Vascular doctor in the morning and is requesting his most recent ov notes to help them. I told the patient he would need to come into the office and sign a records release form. Patient stated he understood.

## 2016-04-26 NOTE — Telephone Encounter (Signed)
Pt requested copies of his medical records concerning his appts with Dr. Cannon Kettle. I told pt I would have them ready for him.

## 2016-04-27 DIAGNOSIS — M79605 Pain in left leg: Secondary | ICD-10-CM | POA: Diagnosis not present

## 2016-05-02 ENCOUNTER — Telehealth: Payer: Self-pay | Admitting: *Deleted

## 2016-05-02 NOTE — Telephone Encounter (Signed)
Pt states he would like to come in and get a shot for his left foot it hurts so bad.

## 2016-05-03 ENCOUNTER — Encounter: Payer: Self-pay | Admitting: Vascular Surgery

## 2016-05-03 DIAGNOSIS — G4733 Obstructive sleep apnea (adult) (pediatric): Secondary | ICD-10-CM | POA: Diagnosis not present

## 2016-05-04 ENCOUNTER — Other Ambulatory Visit: Payer: Self-pay | Admitting: Vascular Surgery

## 2016-05-04 DIAGNOSIS — M79669 Pain in unspecified lower leg: Secondary | ICD-10-CM

## 2016-05-23 ENCOUNTER — Other Ambulatory Visit: Payer: Self-pay | Admitting: Internal Medicine

## 2016-05-23 DIAGNOSIS — F329 Major depressive disorder, single episode, unspecified: Secondary | ICD-10-CM

## 2016-05-23 DIAGNOSIS — F32A Depression, unspecified: Secondary | ICD-10-CM

## 2016-05-27 ENCOUNTER — Encounter: Payer: Self-pay | Admitting: Vascular Surgery

## 2016-06-03 ENCOUNTER — Ambulatory Visit (INDEPENDENT_AMBULATORY_CARE_PROVIDER_SITE_OTHER): Payer: PPO | Admitting: Vascular Surgery

## 2016-06-03 ENCOUNTER — Ambulatory Visit (HOSPITAL_COMMUNITY)
Admission: RE | Admit: 2016-06-03 | Discharge: 2016-06-03 | Disposition: A | Discharge status: Home / Self Care | Payer: PPO | Source: Ambulatory Visit | Attending: Vascular Surgery | Admitting: Vascular Surgery

## 2016-06-03 ENCOUNTER — Encounter: Payer: Self-pay | Admitting: Vascular Surgery

## 2016-06-03 VITALS — BP 135/72 | HR 59 | Temp 97.6°F | Resp 18 | Ht 75.0 in | Wt 230.0 lb

## 2016-06-03 DIAGNOSIS — M79669 Pain in unspecified lower leg: Secondary | ICD-10-CM

## 2016-06-03 DIAGNOSIS — M79662 Pain in left lower leg: Secondary | ICD-10-CM | POA: Diagnosis not present

## 2016-06-03 DIAGNOSIS — M79661 Pain in right lower leg: Secondary | ICD-10-CM | POA: Insufficient documentation

## 2016-06-03 DIAGNOSIS — G4733 Obstructive sleep apnea (adult) (pediatric): Secondary | ICD-10-CM | POA: Diagnosis not present

## 2016-06-03 DIAGNOSIS — M25572 Pain in left ankle and joints of left foot: Secondary | ICD-10-CM | POA: Diagnosis not present

## 2016-06-03 NOTE — Progress Notes (Signed)
Patient ID: John Blackburn, male   DOB: [birth year], 70 y.o.   MRN: [id number]  Reason for Consult: New Evaluation (left leg/foot pain)   Referred by John Mako, MD  Subjective:     HPI:  John Blackburn is a 70 y.o. male presents with a two-month history of left foot pain. 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 This pain that presented 2 months ago is in his left foot is deep throbbing nonradiating has not found alleviating factors states that is exacerbated with walking. Associated symptoms include edema worse in the morning not relieved with compression stockings. He is taken occasional Motrin but has not noted any relief. He is also seen a podiatrist who did not offer him any intervention. He does not have any symptoms of claudication with ambulation and prior to 2 months ago was walking a mile every day without issue. He has never been a smoker is not diabetic does have hypertension high cholesterol and a significant family history of coronary artery disease.  Past Medical History:  Diagnosis Date  . Allergy   . 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 Clotting disorder [facility])    November 2014, no longer on blood thinngers  . Depression    on multiple meds. Has been seen at [facility]. and Dr. Sabra Heck is his prescriber  . Diverticulitis of colon 2000 's   treated as an outpatient  . GERD (gastroesophageal reflux disease)    UGI done August '12 - ulcer/. Dr. Ferdinand Lango, gastroenterologist in [facility].   [name] Hx of adenomatous colonic polyps   . Hyperlipidemia    last lipid panel: HDL 44, LDL 117  . Hypertension   . Peptic ulcer    in the past and just recently-August '12  . Pulmonary emboli [facility])    noted October 2014 - treated by Dr. Linda Hedges  . skin cancer    skin CA  . Sleep apnea, primary central    wears CPAP   Family History  Problem Relation Age of Onset  . Stroke Mother   . Hypertension Mother   . Heart  disease Mother   . Heart disease Father     CAD/MI-fatal sudden death  . Colon cancer Father   . Colon cancer Sister     colon cancer 20090-survivor  . Colon cancer Sister   . Heart disease Paternal Uncle   . Colon cancer Paternal Uncle   . Heart disease Paternal Grandfather   . Colon cancer Paternal Aunt   . Stomach cancer Neg Hx   . Rectal cancer Neg Hx   . Esophageal cancer Neg Hx   . Colon cancer Cousin   . Colon cancer Cousin   . Colon cancer Paternal Uncle    Past Surgical History:  Procedure Laterality Date  . CHOLECYSTECTOMY  1990   laproscopic   . HERNIA REPAIR    . JOINT REPLACEMENT     knee  . PROSTATE SURGERY     needle biopsy's, TURP  . TKR bilateral  2006   Dr. Violet Baldy  . UPPER GASTROINTESTINAL ENDOSCOPY    . VASECTOMY      Short Social History:  Social History  Substance Use Topics  . Smoking status: Never Smoker  . Smokeless tobacco: Never Used  . Alcohol use 0.5 oz/week    1 drink(s) per week     Comment: social    Allergies  Allergen Reactions  .  Cephalexin Rash  . Levofloxacin Rash    Current Outpatient Prescriptions  Medication Sig Dispense Refill  . atenolol-chlorthalidone (TENORETIC) 50-25 MG tablet Take 1 tablet by mouth daily. 90 tablet 3  . buPROPion (WELLBUTRIN XL) 300 MG 24 hr tablet TAKE ONE TABLET BY MOUTH ONCE DAILY 30 tablet 0  . Cholecalciferol 1000 UNITS tablet Take by mouth.    . fluticasone (FLONASE) 50 MCG/ACT nasal spray Place 2 sprays into both nostrils daily. 16 g 2  . Magnesium 250 MG TABS Take by mouth.    . Melatonin 1 MG/4ML LIQD Take by mouth at bedtime.    . Multiple Vitamin (MULTI-VITAMINS) TABS Take by mouth.    . sertraline (ZOLOFT) 50 MG tablet Take 1 tablet (50 mg total) by mouth daily. 30 tablet 2  . tadalafil (CIALIS) 10 MG tablet Take 1 tablet (10 mg total) by mouth daily as needed for erectile dysfunction. 10 tablet 0  . Turmeric 500 MG CAPS Take by mouth.     Current Facility-Administered  Medications  Medication Dose Route Frequency Provider Last Rate Last Dose  . triamcinolone acetonide (KENALOG) 10 MG/ML injection 10 mg  10 mg Other Once Landis Martins, DPM        Review of Systems  Constitutional:  Constitutional negative. HENT: HENT negative.  Eyes: Eyes negative.  Respiratory: Respiratory negative.  Cardiovascular: Cardiovascular negative.  GI: Gastrointestinal negative.  GU: Genitourinary negative. Musculoskeletal: Positive for gait problem and leg pain.  Skin: Skin negative.  Hematologic:       Previous blood clots Psychiatric: Psychiatric negative. Positive for depressed mood.        Objective:  Objective   Vitals:   06/03/16 0914  BP: 135/72  Pulse: (!) 59  Resp: 18  Temp: 97.6 F (36.4 C)  SpO2: 97%  Weight: 230 lb (104.3 kg)  Height: 6\' 3"  (1.905 m)   Body mass index is 28.75 kg/m.  Physical Exam  Constitutional: He is oriented to person, place, and time. He appears well-developed.  HENT:  Head: Normocephalic.  Eyes: EOM are normal.  Neck: Neck supple.  Cardiovascular: Normal rate.   Pulses:      Popliteal pulses are 2+ on the right side, and 2+ on the left side.       Dorsalis pedis pulses are 2+ on the right side, and 2+ on the left side.       Posterior tibial pulses are 2+ on the right side, and 2+ on the left side.  Pulmonary/Chest: Effort normal.  Abdominal: Soft. He exhibits no distension and no mass. There is no tenderness.  Musculoskeletal: Normal range of motion. He exhibits no edema or deformity.  Neurological: He is alert and oriented to person, place, and time.  Skin: Skin is warm and dry.  Psychiatric: He has a normal mood and affect. His behavior is normal. Judgment and thought content normal.  Vitals reviewed.   Data:       Right ABI 1.29 left ABI 1.25 triphasic at all tibials. No evidence of significant right or left lower extremity arterial disease. Assessment/Plan:   19-year-old white male with history of  bilateral lower extremity DVTs without venous reflux on most recent examination now with left foot pain has been evaluated by podiatry. He has palpable pulses in his bilateral lower extremities ABIs greater than 1 bilaterally. This pain does somewhat appear to be getting better with time without arterial or venous disease I would not recommend any intervention at this time. He wants to  have a second opinion from a podiatrist which I have encouraged and he says he will set up on his own. He can follow up in our office when necessary.   Lelia Jons C. Donzetta Matters, MD [facility] Office: [phone] Pager: [phone]

## 2016-07-06 ENCOUNTER — Other Ambulatory Visit: Payer: Self-pay | Admitting: Internal Medicine

## 2016-07-06 DIAGNOSIS — F329 Major depressive disorder, single episode, unspecified: Secondary | ICD-10-CM

## 2016-07-06 DIAGNOSIS — F32A Depression, unspecified: Secondary | ICD-10-CM

## 2016-08-01 ENCOUNTER — Other Ambulatory Visit: Payer: Self-pay | Admitting: Family

## 2016-08-01 DIAGNOSIS — F32A Depression, unspecified: Secondary | ICD-10-CM

## 2016-08-01 DIAGNOSIS — F329 Major depressive disorder, single episode, unspecified: Secondary | ICD-10-CM

## 2016-08-04 DIAGNOSIS — H2513 Age-related nuclear cataract, bilateral: Secondary | ICD-10-CM | POA: Diagnosis not present

## 2016-08-04 DIAGNOSIS — H01021 Squamous blepharitis right upper eyelid: Secondary | ICD-10-CM | POA: Diagnosis not present

## 2016-08-04 DIAGNOSIS — H04123 Dry eye syndrome of bilateral lacrimal glands: Secondary | ICD-10-CM | POA: Diagnosis not present

## 2016-08-04 DIAGNOSIS — H01025 Squamous blepharitis left lower eyelid: Secondary | ICD-10-CM | POA: Diagnosis not present

## 2016-08-04 DIAGNOSIS — H01024 Squamous blepharitis left upper eyelid: Secondary | ICD-10-CM | POA: Diagnosis not present

## 2016-08-04 DIAGNOSIS — H01022 Squamous blepharitis right lower eyelid: Secondary | ICD-10-CM | POA: Diagnosis not present

## 2016-08-11 ENCOUNTER — Other Ambulatory Visit (INDEPENDENT_AMBULATORY_CARE_PROVIDER_SITE_OTHER): Payer: PPO

## 2016-08-11 ENCOUNTER — Ambulatory Visit (INDEPENDENT_AMBULATORY_CARE_PROVIDER_SITE_OTHER): Payer: PPO | Admitting: Internal Medicine

## 2016-08-11 ENCOUNTER — Encounter: Payer: Self-pay | Admitting: Internal Medicine

## 2016-08-11 VITALS — BP 132/70 | HR 61 | Temp 98.1°F | Resp 16 | Ht 74.0 in | Wt 241.0 lb

## 2016-08-11 DIAGNOSIS — J3089 Other allergic rhinitis: Secondary | ICD-10-CM

## 2016-08-11 DIAGNOSIS — Z8249 Family history of ischemic heart disease and other diseases of the circulatory system: Secondary | ICD-10-CM | POA: Diagnosis not present

## 2016-08-11 DIAGNOSIS — I872 Venous insufficiency (chronic) (peripheral): Secondary | ICD-10-CM | POA: Diagnosis not present

## 2016-08-11 DIAGNOSIS — R350 Frequency of micturition: Secondary | ICD-10-CM

## 2016-08-11 DIAGNOSIS — N401 Enlarged prostate with lower urinary tract symptoms: Secondary | ICD-10-CM | POA: Diagnosis not present

## 2016-08-11 DIAGNOSIS — Z800 Family history of malignant neoplasm of digestive organs: Secondary | ICD-10-CM | POA: Diagnosis not present

## 2016-08-11 LAB — COMPREHENSIVE METABOLIC PANEL
ALK PHOS: 85 U/L (ref 39–117)
ALT: 13 U/L (ref 0–53)
AST: 16 U/L (ref 0–37)
Albumin: 4.1 g/dL (ref 3.5–5.2)
BUN: 13 mg/dL (ref 6–23)
CHLORIDE: 106 meq/L (ref 96–112)
CO2: 25 mEq/L (ref 19–32)
Calcium: 9.5 mg/dL (ref 8.4–10.5)
Creatinine, Ser: 0.84 mg/dL (ref 0.40–1.50)
GFR: 95.9 mL/min (ref >60.00)
GLUCOSE: 123 mg/dL — AB (ref 70–99)
POTASSIUM: 4 meq/L (ref 3.5–5.1)
SODIUM: 140 meq/L (ref 135–145)
TOTAL PROTEIN: 7.5 g/dL (ref 6.0–8.3)
Total Bilirubin: 0.6 mg/dL (ref 0.2–1.2)

## 2016-08-11 LAB — BRAIN NATRIURETIC PEPTIDE: PRO B NATRI PEPTIDE: 34 pg/mL (ref 0.0–100.0)

## 2016-08-11 MED ORDER — IPRATROPIUM BROMIDE 0.06 % NA SOLN: 2.0000 | Freq: Four times a day (QID) | NASAL | 12 refills | Status: DC

## 2016-08-11 MED ORDER — FUROSEMIDE 40 MG PO TABS: 40.0000 mg | ORAL_TABLET | Freq: Every day | ORAL | 3 refills | Status: DC

## 2016-08-11 NOTE — Progress Notes (Signed)
   Subjective:    Patient ID: John Blackburn, male    DOB: 07-21-1946, 70 y.o.   MRN: [id number]  HPI The patient is a 70 YO man coming in for several concerns including swelling in his legs (going on for 2-3 months started in his left foot and now both feet are swelling, saw his podiatrist and they could not tell him why, then went to a vascular surgeon and they checked for any new blood clots and did not find any, no pain, no rash, tried some compression socks and they have not helped much, worse in the morning), and his running nose (he has tried flonase, it helps some, but he is still getting constant running of his nose, no sinus pressure), and he is concerned about his family history of CAD (he has been monitored in the past but no recent follow up with cardiology, he is concerned about the swelling meaning that there is something wrong with his heart, getting out of breath easily sometimes, able to walk up a flight of stairs).  Review of Systems  Constitutional: Negative for activity change, appetite change, fatigue, fever and unexpected weight change.  HENT: Negative.   Eyes: Negative.   Respiratory: Positive for shortness of breath. Negative for cough, chest tightness and wheezing.   Cardiovascular: Positive for leg swelling. Negative for chest pain and palpitations.  Gastrointestinal: Negative.   Musculoskeletal: Negative.   Skin: Negative.   Neurological: Negative.       Objective:   Physical Exam  Constitutional: He is oriented to person, place, and time. He appears well-developed and well-nourished.  Overweight  HENT:  Head: Normocephalic and atraumatic.  Eyes: EOM are normal.  Neck: Normal range of motion.  Cardiovascular: Normal rate and regular rhythm.   Pulmonary/Chest: Effort normal and breath sounds normal.  Abdominal: Soft. He exhibits no distension. There is no tenderness. There is no rebound.  Musculoskeletal: He exhibits edema.  2+ edema to midshin bilaterally,  no calf tenderness.   Neurological: He is alert and oriented to person, place, and time.  Skin: Skin is warm and dry.   Vitals:   08/11/16 1000  BP: 132/70  Pulse: 61  Resp: 16  Temp: 98.1 F (36.7 C)  TempSrc: Oral  SpO2: 96%  Weight: 241 lb (109.3 kg)  Height: 6\' 2"  (1.88 m)      Assessment & Plan:

## 2016-08-11 NOTE — Patient Instructions (Signed)
We are going to get you back in with the specialists we talked about as well as the cardiologist.   We are checking labs today to see if there is a cause of the swelling.   We have sent in the lasix (furosemide) to take 1 pill daily as needed for swelling.   We have also sent in the new nose spray to try called atrovent which you can use up to 4 times per day as needed.

## 2016-08-11 NOTE — Progress Notes (Signed)
Pre visit review using our clinic review tool, if applicable. No additional management support is needed unless otherwise documented below in the visit note. 

## 2016-08-12 DIAGNOSIS — Z8249 Family history of ischemic heart disease and other diseases of the circulatory system: Secondary | ICD-10-CM | POA: Insufficient documentation

## 2016-08-12 DIAGNOSIS — Z800 Family history of malignant neoplasm of digestive organs: Secondary | ICD-10-CM | POA: Insufficient documentation

## 2016-08-12 NOTE — Assessment & Plan Note (Signed)
Referral back to GI for consideration of sooner colonoscopy as another sister diagnosed with colon cancer and he feels 5 years is too long for him to wait.

## 2016-08-12 NOTE — Assessment & Plan Note (Signed)
Will stop flonase and try atrovent nose spray instead which was prescribed today.

## 2016-08-12 NOTE — Assessment & Plan Note (Addendum)
Checking BNP and CMP to rule out other etiology of the swelling, he has tried compression hose without relief. Rx for lasix 40 mg daily prn for the swelling. Not likely to be DVT as no pain and (1 remote episode provoked by surgery, no recent triggers).

## 2016-08-12 NOTE — Assessment & Plan Note (Signed)
Referral to cardiology for possible stress test of other evaluation. Has current hyperlipidemia, hypertension and strong family history of coronary disease.

## 2016-08-23 ENCOUNTER — Encounter: Payer: Self-pay | Admitting: Cardiology

## 2016-08-23 DIAGNOSIS — Z8711 Personal history of peptic ulcer disease: Secondary | ICD-10-CM | POA: Diagnosis not present

## 2016-08-23 DIAGNOSIS — E668 Other obesity: Secondary | ICD-10-CM | POA: Diagnosis not present

## 2016-08-23 DIAGNOSIS — Z8249 Family history of ischemic heart disease and other diseases of the circulatory system: Secondary | ICD-10-CM | POA: Diagnosis not present

## 2016-08-23 DIAGNOSIS — E785 Hyperlipidemia, unspecified: Secondary | ICD-10-CM | POA: Diagnosis not present

## 2016-08-23 DIAGNOSIS — Z86711 Personal history of pulmonary embolism: Secondary | ICD-10-CM | POA: Diagnosis not present

## 2016-08-23 DIAGNOSIS — I872 Venous insufficiency (chronic) (peripheral): Secondary | ICD-10-CM | POA: Diagnosis not present

## 2016-08-23 DIAGNOSIS — R609 Edema, unspecified: Secondary | ICD-10-CM | POA: Diagnosis not present

## 2016-08-23 DIAGNOSIS — I119 Hypertensive heart disease without heart failure: Secondary | ICD-10-CM | POA: Diagnosis not present

## 2016-08-23 DIAGNOSIS — G4733 Obstructive sleep apnea (adult) (pediatric): Secondary | ICD-10-CM | POA: Diagnosis not present

## 2016-09-03 ENCOUNTER — Other Ambulatory Visit: Payer: Self-pay | Admitting: Internal Medicine

## 2016-09-03 DIAGNOSIS — F329 Major depressive disorder, single episode, unspecified: Secondary | ICD-10-CM

## 2016-09-03 DIAGNOSIS — F32A Depression, unspecified: Secondary | ICD-10-CM

## 2016-09-08 DIAGNOSIS — I119 Hypertensive heart disease without heart failure: Secondary | ICD-10-CM | POA: Diagnosis not present

## 2016-09-08 DIAGNOSIS — E785 Hyperlipidemia, unspecified: Secondary | ICD-10-CM | POA: Diagnosis not present

## 2016-09-08 DIAGNOSIS — E668 Other obesity: Secondary | ICD-10-CM | POA: Diagnosis not present

## 2016-09-08 DIAGNOSIS — G4733 Obstructive sleep apnea (adult) (pediatric): Secondary | ICD-10-CM | POA: Diagnosis not present

## 2016-09-08 DIAGNOSIS — Z8249 Family history of ischemic heart disease and other diseases of the circulatory system: Secondary | ICD-10-CM | POA: Diagnosis not present

## 2016-09-08 DIAGNOSIS — R609 Edema, unspecified: Secondary | ICD-10-CM | POA: Diagnosis not present

## 2016-09-08 DIAGNOSIS — Z86711 Personal history of pulmonary embolism: Secondary | ICD-10-CM | POA: Diagnosis not present

## 2016-09-08 DIAGNOSIS — R0602 Shortness of breath: Secondary | ICD-10-CM | POA: Diagnosis not present

## 2016-09-08 DIAGNOSIS — Z8711 Personal history of peptic ulcer disease: Secondary | ICD-10-CM | POA: Diagnosis not present

## 2016-09-08 DIAGNOSIS — I872 Venous insufficiency (chronic) (peripheral): Secondary | ICD-10-CM | POA: Diagnosis not present

## 2016-09-12 DIAGNOSIS — R600 Localized edema: Secondary | ICD-10-CM | POA: Diagnosis not present

## 2016-09-13 ENCOUNTER — Ambulatory Visit (INDEPENDENT_AMBULATORY_CARE_PROVIDER_SITE_OTHER): Payer: PPO

## 2016-09-13 ENCOUNTER — Ambulatory Visit (INDEPENDENT_AMBULATORY_CARE_PROVIDER_SITE_OTHER): Payer: PPO | Admitting: Physician Assistant

## 2016-09-13 VITALS — BP 126/70 | HR 62 | Temp 99.1°F | Resp 20 | Ht 73.0 in | Wt 244.6 lb

## 2016-09-13 DIAGNOSIS — R0781 Pleurodynia: Secondary | ICD-10-CM

## 2016-09-13 DIAGNOSIS — S2231XA Fracture of one rib, right side, initial encounter for closed fracture: Secondary | ICD-10-CM | POA: Diagnosis not present

## 2016-09-13 LAB — POCT URINALYSIS DIP (MANUAL ENTRY)
BILIRUBIN UA: NEGATIVE
BILIRUBIN UA: NEGATIVE
Blood, UA: NEGATIVE
GLUCOSE UA: NEGATIVE
LEUKOCYTES UA: NEGATIVE
NITRITE UA: NEGATIVE
Protein Ur, POC: NEGATIVE
Spec Grav, UA: 1.01
Urobilinogen, UA: 0.2
pH, UA: 5

## 2016-09-13 IMAGING — DX DG RIBS W/ CHEST 3+V*R*
4 series · 4 of 4 positions shown · non-contrast
Comparison: Chest x-ray [DATE]

CLINICAL DATA: Fell 1 week ago.  Persistent right-sided chest pain.

EXAM:
RIGHT RIBS AND CHEST - 3+ VIEW

[chest pa]
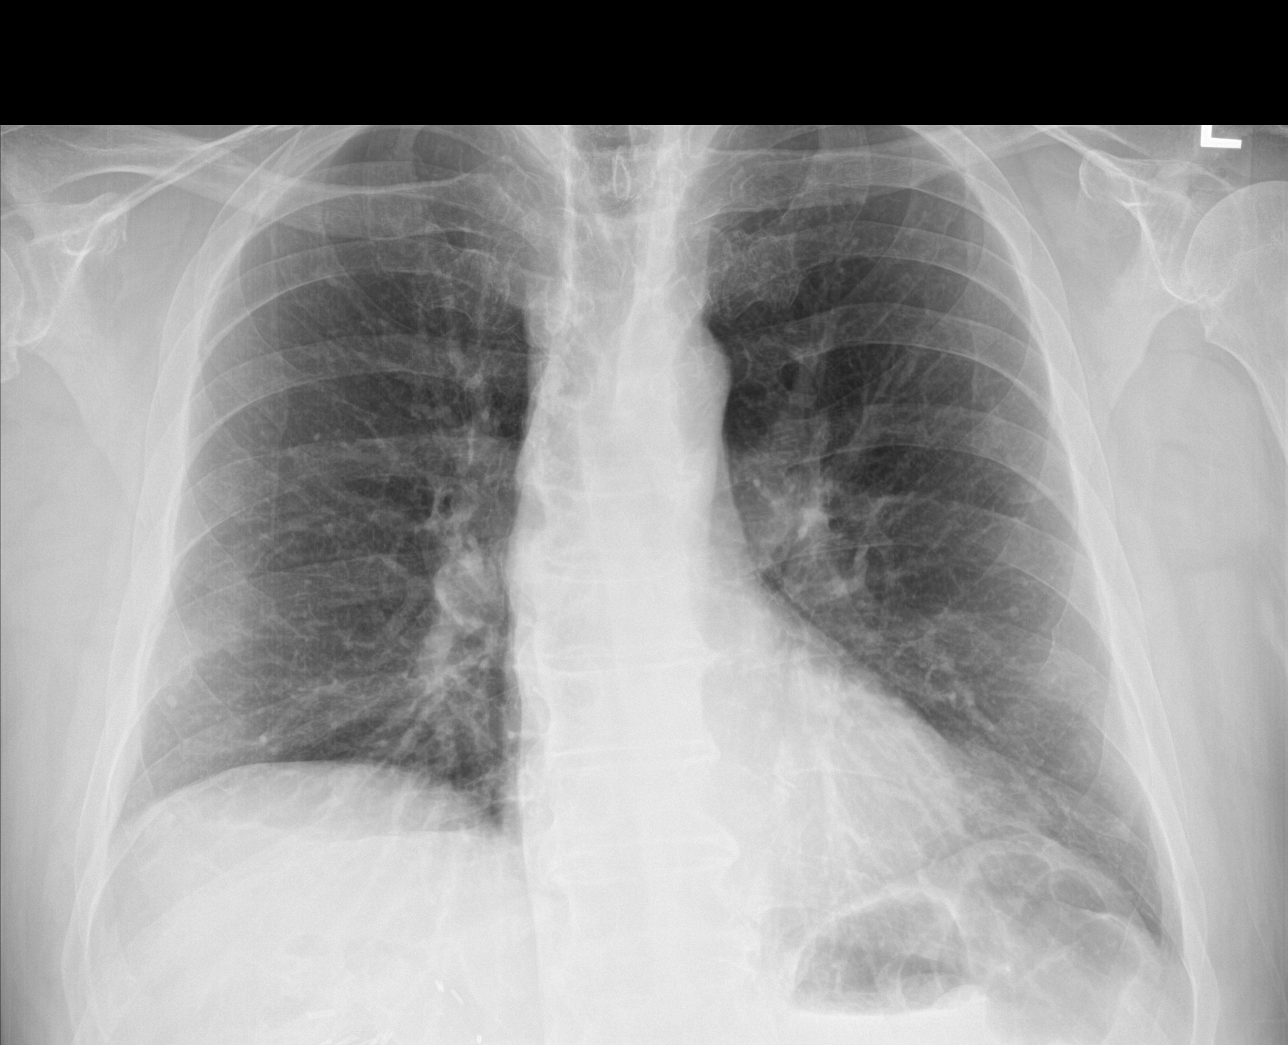

[rib obl (1 of 2)]
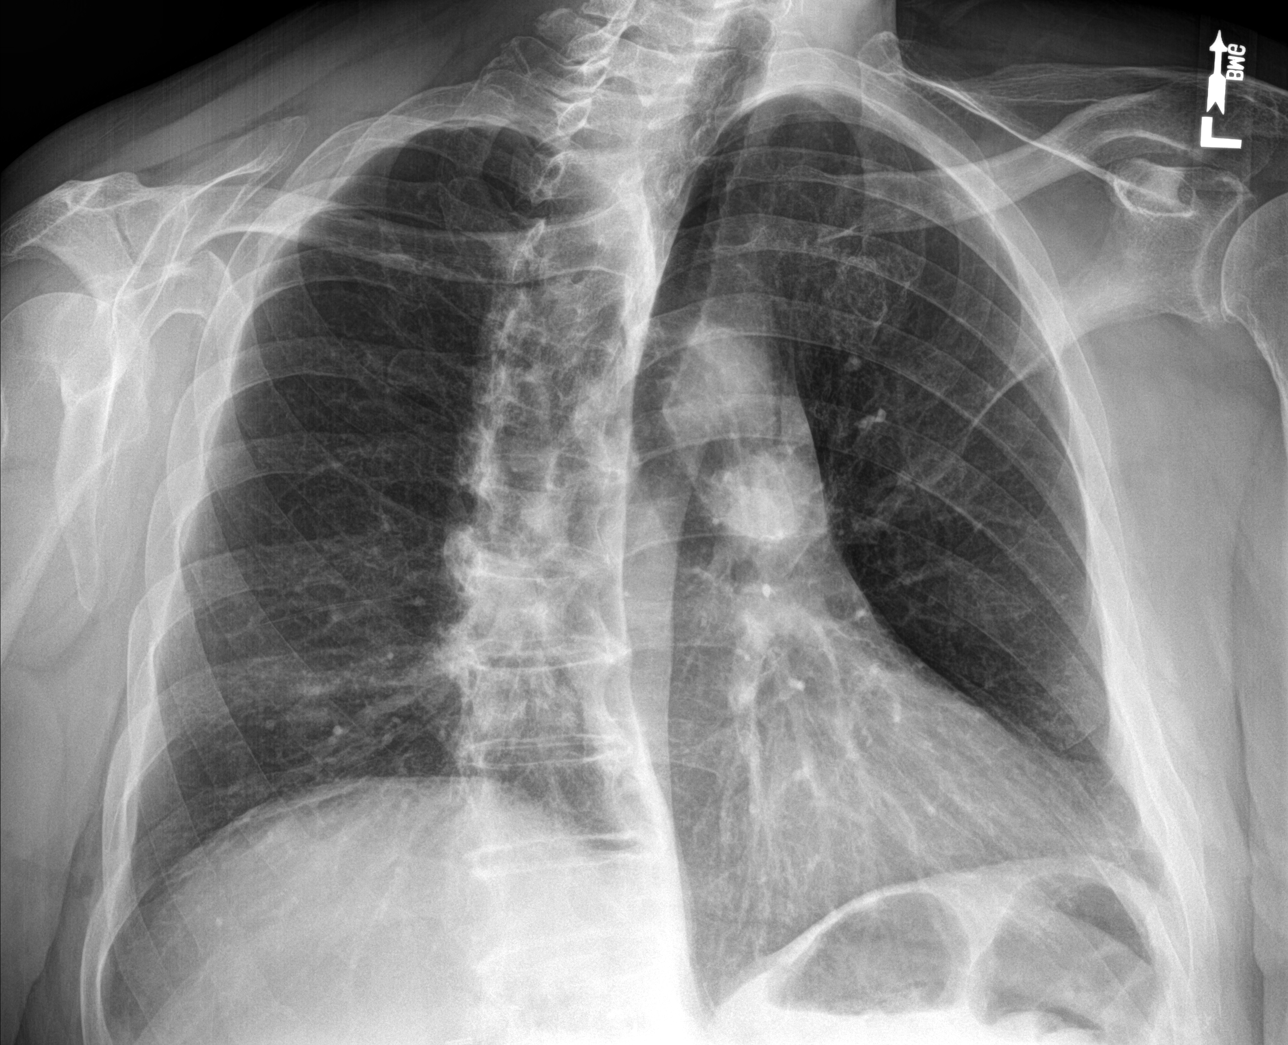

[rib obl (2 of 2)]
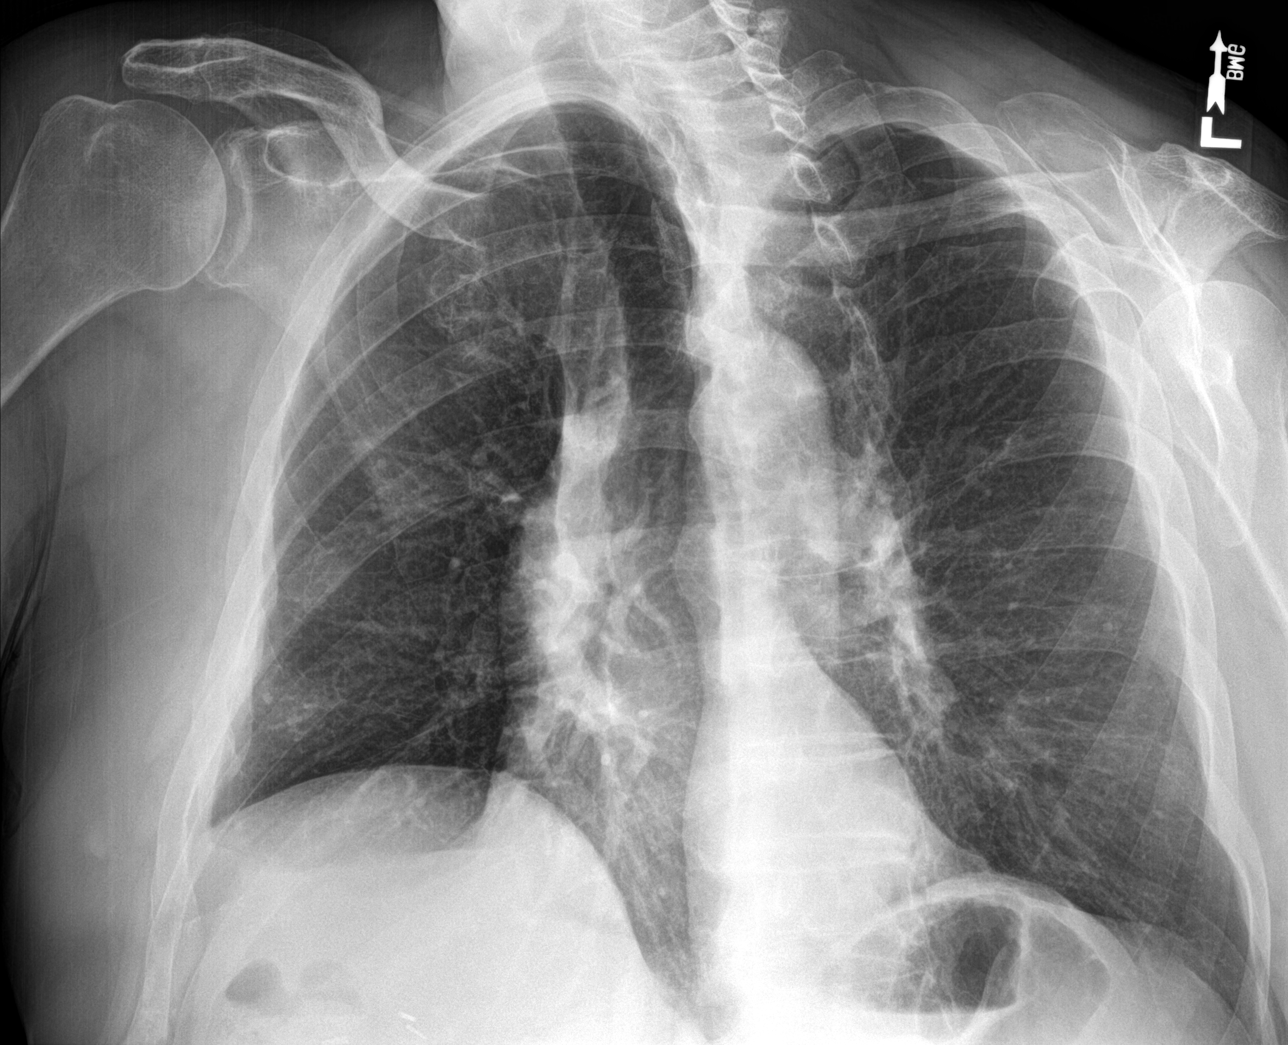

[chest ap]
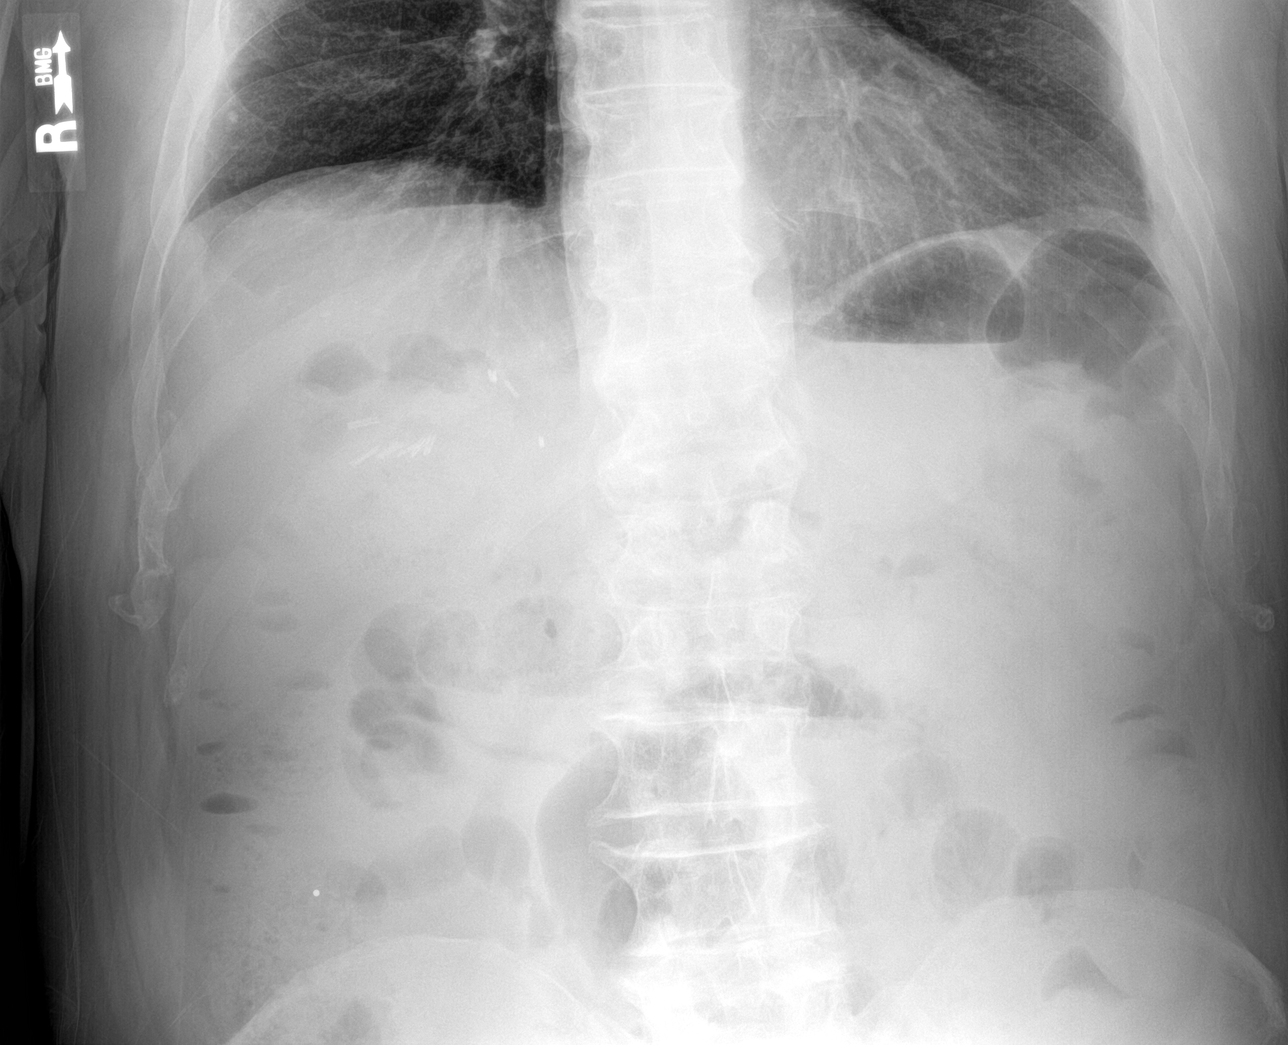

[4 of 4 positions shown; findings below may reference images not displayed]

FINDINGS: The heart is normal in size. The mediastinal and hilar contours are
normal in stable. The lungs are clear. No pleural effusion or
pneumothorax. Stable small calcified granuloma in the right lower
lobe.

Dedicated views of the right ribs demonstrate a nondisplaced right
sixth anterior rib fracture.
IMPRESSION: Nondisplaced right anterior sixth rib fracture.

No acute cardiopulmonary findings.

## 2016-09-13 NOTE — Patient Instructions (Addendum)
You can take tylenol for pain.  This may take several weeks to have improvement of your symptoms.  You can use the splint as needed.  I would like you to use the incentive spirometer to make sure you are taking deep breaths in.    Rib Fracture A rib fracture is a break or crack in one of the bones of the ribs. The ribs are like a cage that goes around your upper chest. A broken or cracked rib is often painful, but most do not cause other problems. Most rib fractures heal on their own in 1-3 months. Follow these instructions at home:  Avoid activities that cause pain to the injured area. Protect your injured area.  Slowly increase activity as told by your doctor.  Take medicine as told by your doctor.  Put ice on the injured area for the first 1-2 days after you have been treated or as told by your doctor.  Put ice in a plastic bag.  Place a towel between your skin and the bag.  Leave the ice on for 15-20 minutes at a time, every 2 hours while you are awake.  Do deep breathing as told by your doctor. You may be told to:  Take deep breaths many times a day.  Cough many times a day while hugging a pillow.  Use a device (incentive spirometer) to perform deep breathing many times a day.  Drink enough fluids to keep your pee (urine) clear or pale yellow.  Do not wear a rib belt or binder. These do not allow you to breathe deeply. Get help right away if:  You have a fever.  You have trouble breathing.  You cannot stop coughing.  You cough up thick or bloody spit (mucus).  You feel sick to your stomach (nauseous), throw up (vomit), or have belly (abdominal) pain.  Your pain gets worse and medicine does not help. This information is not intended to replace advice given to you by your health care provider. Make sure you discuss any questions you have with your health care provider. Document Released: 05/31/2008 Document Revised: 01/28/2016 Document Reviewed: 10/24/2012 Elsevier  Interactive Patient Education  2017 [employer].     IF you received an x-ray today, you will receive an invoice from [facility]. Please contact [facility] at [phone] with questions or concerns regarding your invoice.   IF you received labwork today, you will receive an invoice from [facility]. Please contact [facility] at [phone] with questions or concerns regarding your invoice.   Our billing staff will not be able to assist you with questions regarding bills from these companies.  You will be contacted with the lab results as soon as they are available. The fastest way to get your results is to activate your My Chart account. Instructions are located on the last page of this paperwork. If you have not heard from [place] regarding the results in 2 weeks, please contact this office.

## 2016-09-13 NOTE — Progress Notes (Signed)
[facility] [address] [place] [address] [phone]  Date:  09/13/2016   Name:  John Blackburn   DOB:  [birth year]   MRN:  [id number]  PCP:  Hoyt Koch, MD    History of Present Illness:  John Blackburn is a 71 y.o. male patient who presents to [facility] for cc of right sided low back pain for 1 week. Patient was tripped and fell on his back, on a metal device in his home.  He had noticed some sharp pain that had dissipated in time of the injury, but over the last 2 days, symptoms have progressively worsened.  It is sharp at the right side of his low back.  He has no hematuria, or numbness or tingling.  He has bruising.       Patient Active Problem List   Diagnosis Date Noted  . Family history of early CAD 08/12/2016  . Family hx of colon cancer 08/12/2016  . Allergic rhinitis 10/15/2013  . Chronic venous insufficiency 07/20/2013  . Acute pulmonary embolism ([place]) 07/08/2013  . ED (erectile dysfunction) 06/01/2011  . Laryngitis, chronic 06/01/2011  . Arthritis   . Hypertension   . Hyperlipidemia   . Sleep apnea, primary central     Past Medical History:  Diagnosis Date  . Allergy   . 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 Clotting disorder [facility])    November 2014, no longer on blood thinngers  . Depression    on multiple meds. Has been seen at [facility]. and Dr. Sabra Heck is his prescriber  . Diverticulitis of colon 2000 's   treated as an outpatient  . GERD (gastroesophageal reflux disease)    UGI done August '12 - ulcer/. Dr. Ferdinand Lango, gastroenterologist in [facility].   [name] Hx of adenomatous colonic polyps   . Hyperlipidemia    last lipid panel: HDL 44, LDL 117  . Hypertension   . Peptic ulcer    in the past and just recently-August '12  . Pulmonary emboli [facility])    noted October 2014 - treated by Dr. Linda Hedges  . skin cancer    skin CA  . Sleep apnea, primary central    wears CPAP    Past Surgical History:   Procedure Laterality Date  . CHOLECYSTECTOMY  1990   laproscopic   . HERNIA REPAIR    . JOINT REPLACEMENT     knee  . PROSTATE SURGERY     needle biopsy's, TURP  . TKR bilateral  2006   Dr. Violet Baldy  . UPPER GASTROINTESTINAL ENDOSCOPY    . VASECTOMY      Social History  Substance Use Topics  . Smoking status: Never Smoker  . Smokeless tobacco: Never Used  . Alcohol use 0.5 oz/week    1 drink(s) per week     Comment: social    Family History  Problem Relation Age of Onset  . Stroke Mother   . Hypertension Mother   . Heart disease Mother   . Heart disease Father     CAD/MI-fatal sudden death  . Colon cancer Father   . Colon cancer Sister     colon cancer 20090-survivor  . Heart disease Paternal Uncle   . Colon cancer Paternal Uncle   . Heart disease Paternal Grandfather   . Colon cancer Paternal Aunt   . Colon cancer Cousin   . Colon cancer Cousin   . Colon cancer Paternal Uncle   .  Stomach cancer Neg Hx   . Rectal cancer Neg Hx   . Esophageal cancer Neg Hx     Allergies  Allergen Reactions  . Cephalexin Rash  . Levofloxacin Rash    Medication list has been reviewed and updated.  Current Outpatient Prescriptions on File Prior to Visit  Medication Sig Dispense Refill  . atenolol-chlorthalidone (TENORETIC) 50-25 MG tablet Take 1 tablet by mouth daily. 90 tablet 3  . buPROPion (WELLBUTRIN XL) 300 MG 24 hr tablet TAKE ONE TABLET BY MOUTH ONCE DAILY 30 tablet 0  . Cholecalciferol 1000 UNITS tablet Take by mouth.    . furosemide (LASIX) 40 MG tablet Take 1 tablet (40 mg total) by mouth daily. 30 tablet 3  . ipratropium (ATROVENT) 0.06 % nasal spray Place 2 sprays into both nostrils 4 (four) times daily. 15 mL 12  . Magnesium 250 MG TABS Take by mouth.    . Melatonin 1 MG/4ML LIQD Take by mouth at bedtime.    . Multiple Vitamin (MULTI-VITAMINS) TABS Take by mouth.    . sertraline (ZOLOFT) 50 MG tablet TAKE ONE TABLET BY MOUTH ONCE DAILY 30 tablet 2  .  Turmeric 500 MG CAPS Take by mouth.    . tadalafil (CIALIS) 10 MG tablet Take 1 tablet (10 mg total) by mouth daily as needed for erectile dysfunction. (Patient not taking: Reported on 09/13/2016) 10 tablet 0   No current facility-administered medications on file prior to visit.     ROS ROS otherwise unremarkable unless listed above.  Physical Examination: BP 126/70 (BP Location: Right Arm, Patient Position: Sitting, Cuff Size: Large)   Pulse 62   Temp 99.1 F (37.3 C) (Oral)   Resp 20   Ht 6\' 1"  (1.854 m)   Wt 244 lb 9.6 oz (110.9 kg)   SpO2 96%   BMI 32.27 kg/m  Ideal Body Weight: Weight in (lb) to have BMI = 25: 189.1  Physical Exam  Constitutional: He is oriented to person, place, and time. He appears well-developed and well-nourished. No distress.  HENT:  Head: Normocephalic and atraumatic.  Eyes: Conjunctivae and EOM are normal. Pupils are equal, round, and reactive to light.  Cardiovascular: Normal rate.   Pulmonary/Chest: Effort normal. No respiratory distress.  Musculoskeletal:       Lumbar back: He exhibits normal range of motion and no bony tenderness.  Normal range of motion in all planes without pain incited Ecchymosis just distal to costal border.  There is tenderness along this area.  Mild tenderness along the border of the last inferior rib.  Neurological: He is alert and oriented to person, place, and time.  Skin: Skin is warm and dry. He is not diaphoretic.  Psychiatric: He has a normal mood and affect. His behavior is normal.   Results for orders placed or performed in visit on 09/13/16  POCT urinalysis dipstick  Result Value Ref Range   Color, UA yellow yellow   Clarity, UA clear clear   Glucose, UA negative negative   Bilirubin, UA negative negative   Ketones, POC UA negative negative   Spec Grav, UA 1.010    Blood, UA negative negative   pH, UA 5.0    Protein Ur, POC negative negative   Urobilinogen, UA 0.2    Nitrite, UA Negative Negative    Leukocytes, UA Negative Negative   Dg Ribs Unilateral W/chest Right  Result Date: 09/13/2016 CLINICAL DATA:  [place] 1 week ago.  Persistent right-sided chest pain. EXAM: RIGHT RIBS AND CHEST -  3+ VIEW COMPARISON:  Chest x-ray 05/26/2015 FINDINGS: The heart is normal in size. The mediastinal and hilar contours are normal in stable. The lungs are clear. No pleural effusion or pneumothorax. Stable small calcified granuloma in the right lower lobe. Dedicated views of the right ribs demonstrate a nondisplaced right sixth anterior rib fracture. IMPRESSION: Nondisplaced right anterior sixth rib fracture. No acute cardiopulmonary findings. Electronically Signed   By: Marijo Sanes M.D.   On: 09/13/2016 18:20    Assessment and Plan: John Blackburn is a 71 y.o. male who is here today for cc of low back pain following a fall. Vitals wnl, urine unremarkable.  Placed in ace wrap splint for support with pain.  Advised tylenol and icing.   Rib pain - Plan: DG Ribs Unilateral W/Chest Right, POCT urinalysis dipstick  Ivar Drape, PA-C [facility] 1/10/20188:56 PM

## 2016-09-15 ENCOUNTER — Encounter: Payer: Self-pay | Admitting: Internal Medicine

## 2016-10-04 DIAGNOSIS — N401 Enlarged prostate with lower urinary tract symptoms: Secondary | ICD-10-CM | POA: Diagnosis not present

## 2016-10-04 DIAGNOSIS — N5201 Erectile dysfunction due to arterial insufficiency: Secondary | ICD-10-CM | POA: Diagnosis not present

## 2016-10-04 DIAGNOSIS — R350 Frequency of micturition: Secondary | ICD-10-CM | POA: Diagnosis not present

## 2016-10-04 DIAGNOSIS — F524 Premature ejaculation: Secondary | ICD-10-CM | POA: Diagnosis not present

## 2016-10-18 DIAGNOSIS — N5201 Erectile dysfunction due to arterial insufficiency: Secondary | ICD-10-CM | POA: Diagnosis not present

## 2016-10-18 DIAGNOSIS — N401 Enlarged prostate with lower urinary tract symptoms: Secondary | ICD-10-CM | POA: Diagnosis not present

## 2016-10-18 DIAGNOSIS — R350 Frequency of micturition: Secondary | ICD-10-CM | POA: Diagnosis not present

## 2016-10-20 ENCOUNTER — Encounter: Payer: Self-pay | Admitting: Gastroenterology

## 2016-10-20 ENCOUNTER — Ambulatory Visit (INDEPENDENT_AMBULATORY_CARE_PROVIDER_SITE_OTHER): Payer: PPO | Admitting: Physician Assistant

## 2016-10-20 ENCOUNTER — Encounter: Payer: Self-pay | Admitting: Physician Assistant

## 2016-10-20 VITALS — BP 128/76 | HR 72 | Ht 73.0 in | Wt 240.0 lb

## 2016-10-20 DIAGNOSIS — Z800 Family history of malignant neoplasm of digestive organs: Secondary | ICD-10-CM | POA: Diagnosis not present

## 2016-10-20 DIAGNOSIS — R194 Change in bowel habit: Secondary | ICD-10-CM

## 2016-10-20 MED ORDER — NA SULFATE-K SULFATE-MG SULF 17.5-3.13-1.6 GM/177ML PO SOLN: 1.0000 | ORAL | 0 refills | Status: DC

## 2016-10-20 NOTE — Progress Notes (Signed)
Chief Complaint: Family history of colon cancer, change in bowel habits  HPI:  John Blackburn is a 71 year old male with a past medical history of arthritis, depression, diverticulitis, GERD, hyperlipidemia, adenomatous colon polyps, peptic ulcer, hypertension, pulmonary emboli and sleep apnea as well as others listed below, who was referred to me by Hoyt Koch, * for a complaint of change in bowel habits and family history of colon cancer.   Patient follows with Dr. Ardis Hughs. His last colonoscopy was 03/05/14 with a finding of 2 polyps, one sessile and 6 mm across located in the transverse segment as well as one sessile 2 mm across in the transverse segment. There were also a few diverticulum in the left colon and the exam was otherwise normal. It was discussed that given his significant family history of colon cancer he should have a repeat colonoscopy in 5 years even if the polyps were not precancerous.   Patient's family history is significant for colon cancer in 2 of his sisters, his maternal grandmother, his father, 2 brothers and a cousin.   Today, the patient presents to clinic and tells me that his other sister was recently diagnosed with colon cancer and this has made him more worried. Over the past 2 months he has had a change in bowel movements and tells me that when he does have a bowel movement it is very "small bits" which seem somewhat loose and this is "not every day". The patient tells me he did try Metamucil after his diagnosis of constipation by Dr. Ardis Hughs earlier last year and this was of no help. Most recently over the past months he has also experienced a few episodes of fecal incontinence when he coughed or sneezed. 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 The patient is worried because his sister had a very aggressive form of colon cancer and he would like to make sure this change in bowel habits does not  mean anything else.   Patient denies fever, chills, blood in his stool, melena, weight loss, fatigue, anorexia, nausea, vomiting, heartburn, reflux or abdominal pain.  Previous GI Procedures:  Colonoscopy Dr. Verl Blalock, May 1990, done for "family history of colon cancer" examination was unremarkable, no polyps.   Colonoscopy Dr. Verl Blalock July 1996 done for "strong family history of colon cancer", no polyps were found.   Colonoscopy Dr. Verl Blalock September 2000, done for "very strong family history of colon cancer", findings were left-sided diverticulosis, no polyps, he was recommended to have repeat colonoscopy in 3-5 years and annual Hemoccult testing.   Colonoscopy Dr. Verl Blalock may 2006 done for "history of adenomatous polyps" findings left-sided diverticulosis, no polyps, recommended to have repeat colonoscopy at 5 years.   Colonoscopy reported in office note by Dr. Ferdinand Lango, "January 2009. One polyp removed from 18 cm which path showed to be tubular adenoma with low-grade dysplasia." The procedure report and path report from this 2009 procedure were not sent. At that time the patient was in Dr. Ferdinand Lango office requesting to have colonoscopy done for family history of colon cancer. Dr. Collier Salina decided to do that February 2012.   Colonoscopy Dr. Ferdinand Lango, February 2012. No polyps were found, bowel prep" inadequate". He was recommended to have repeat colonoscopy at one year with more extensive prep.   Colonoscopy June 2013, Dr. Ruthell Rummage, indications " Personal history of colon polyps, family history of colon cancer" no polyps were found,  bowel prep was documented to be "inadequate"   Colonoscopy Dr. Ardis Hughs 03/2014: 2 sub CM polyps were found, were TA's, recommended recall at 5 years;  Also Genetic Counselor referral. Letter 08/2014 " Your negative result suggests you are not at a significant, increased genetic risk to develop a future cancer associated with these genes.  However, given your personal and family history of cancer you should continue to follow the cancer screening and management recommendations provided to you by your healthcare providers".   Past Medical History:  Diagnosis Date  . Allergy   . 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 Clotting disorder [facility])    November 2014, no longer on blood thinngers  . Depression    on multiple meds. Has been seen at [facility]. and Dr. Sabra Heck is his prescriber  . Diverticulitis of colon 2000 's   treated as an outpatient  . GERD (gastroesophageal reflux disease)    UGI done August '12 - ulcer/. Dr. Ferdinand Lango, gastroenterologist in [facility].   [name] Hx of adenomatous colonic polyps   . Hyperlipidemia    last lipid panel: HDL 44, LDL 117  . Hypertension   . Peptic ulcer    in the past and just recently-August '12  . Pulmonary emboli [facility])    noted October 2014 - treated by Dr. Linda Hedges  . skin cancer    skin CA  . Sleep apnea, primary central    wears CPAP    Past Surgical History:  Procedure Laterality Date  . CHOLECYSTECTOMY  1990   laproscopic   . HERNIA REPAIR    . JOINT REPLACEMENT     knee  . PROSTATE SURGERY     needle biopsy's, TURP  . TKR bilateral  2006   Dr. Violet Baldy  . UPPER GASTROINTESTINAL ENDOSCOPY    . VASECTOMY      Current Outpatient Prescriptions  Medication Sig Dispense Refill  . atenolol-chlorthalidone (TENORETIC) 50-25 MG tablet Take 1 tablet by mouth daily. 90 tablet 3  . buPROPion (WELLBUTRIN XL) 300 MG 24 hr tablet TAKE ONE TABLET BY MOUTH ONCE DAILY 30 tablet 0  . Cholecalciferol 1000 UNITS tablet Take by mouth.    . furosemide (LASIX) 40 MG tablet Take 1 tablet (40 mg total) by mouth daily. 30 tablet 3  . ipratropium (ATROVENT) 0.06 % nasal spray Place 2 sprays into both nostrils 4 (four) times daily. 15 mL 12  . Magnesium 250 MG TABS Take by mouth.    . Melatonin 1 MG/4ML LIQD Take by mouth at bedtime.    . Multiple Vitamin  (MULTI-VITAMINS) TABS Take by mouth.    . sertraline (ZOLOFT) 50 MG tablet TAKE ONE TABLET BY MOUTH ONCE DAILY 30 tablet 2  . tadalafil (CIALIS) 10 MG tablet Take 1 tablet (10 mg total) by mouth daily as needed for erectile dysfunction. (Patient not taking: Reported on 09/13/2016) 10 tablet 0  . Turmeric 500 MG CAPS Take by mouth.     No current facility-administered medications for this visit.     Allergies as of 10/20/2016 - Review Complete 09/13/2016  Allergen Reaction Noted  . Cephalexin Rash 05/03/2011  . Levofloxacin Rash 05/03/2011    Family History  Problem Relation Age of Onset  . Stroke Mother   . Hypertension Mother   . Heart disease Mother   . Heart disease Father     CAD/MI-fatal sudden death  . Colon cancer Father   . Colon cancer Sister  colon cancer 20090-survivor  . Heart disease Paternal Uncle   . Colon cancer Paternal Uncle   . Heart disease Paternal Grandfather   . Colon cancer Paternal Aunt   . Colon cancer Cousin   . Colon cancer Cousin   . Colon cancer Paternal Uncle   . Stomach cancer Neg Hx   . Rectal cancer Neg Hx   . Esophageal cancer Neg Hx     Social History   Social History  . Marital status: Divorced    Spouse name: N/A  . Number of children: 3  . Years of education: 78   Occupational History  . contractor    Social History Main Topics  . Smoking status: Never Smoker  . Smokeless tobacco: Never Used  . Alcohol use 0.5 oz/week    1 drink(s) per week     Comment: social  . Drug use: No  . Sexual activity: Not Currently    Partners: Female   Other Topics Concern  . 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 3 sons - '74, '76, '87.  1 grandchild.   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 No pets. Serially monogamous.   ACP - he has a living will: DNR, DNI.    Review of Systems:    Constitutional: No weight loss, fever or chills Skin: No  rash Cardiovascular: No chest pain Respiratory: No SOB  Gastrointestinal: See HPI and otherwise negative Genitourinary: No dysuria or change in urinary frequency Neurological: No headache Musculoskeletal: No new muscle or joint pain Hematologic: No bleeding or bruising Psychiatric: No history of depression or anxiety   Physical Exam:  Vital signs: BP 128/76   Pulse 72   Ht 6\' 1"  (1.854 m)   Wt 240 lb (108.9 kg)   BMI 31.66 kg/m    Constitutional:   Pleasant overweight Caucasian male appears to be in NAD, Well developed, Well nourished, alert and cooperative Head:  Normocephalic and atraumatic. Eyes:   PEERL, EOMI. No icterus. Conjunctiva pink. Ears:  Normal auditory acuity. Neck:  Supple Throat: Oral cavity and pharynx without inflammation, swelling or lesion.  Respiratory: Respirations even and unlabored. Lungs clear to auscultation bilaterally.   No wheezes, crackles, or rhonchi.  Cardiovascular: Normal S1, S2. No MRG. Regular rate and rhythm. No peripheral edema, cyanosis or pallor.  Gastrointestinal:  Soft, nondistended, nontender. No rebound or guarding. Normal bowel sounds. No appreciable masses or hepatomegaly. Rectal:  Not performed.  Msk:  Symmetrical without gross deformities. Without edema, no deformity or joint abnormality.  Neurologic:  Alert and  oriented x4;  grossly normal neurologically.  Skin:   Dry and intact without significant lesions or rashes. Psychiatric:  Demonstrates good judgement and reason without abnormal affect or behaviors.  No recent labs or imaging.  Assessment: 1. Family history of colon cancer: In almost all of his dad's side of the family and also his brothers and sisters, some more "aggressive forms", this makes the patient hyper aware of his symptoms. His last colonoscopy was in 2015 with recommendations for repeat in 5 years, he also had genetic counseling which made the same recommendation 2. Change in bowel habits: Patient has noted a  change in bowel habits with some fecal incontinence, believe this is overflow constipation as he only has small fragments of bowel movements at any one time  Plan: 1. Scheduled patient for a colonoscopy for further evaluation of symptoms. Discussed risks, benefits,  limitations and alternatives and the patient agrees to proceed. This was scheduled with Dr. Ardis Hughs in the [facility]. 2. Recommend the patient start daily MiraLAX dosing. We did discuss titration of this in detail. 3. Patient to return to clinic per recommendations from Dr. Ardis Hughs after time of procedure.  Ellouise Newer, PA-C [facility] 10/20/2016, 1:22 PM  Cc: Hoyt Koch, *

## 2016-10-20 NOTE — Progress Notes (Signed)
I agree with the above note, plan 

## 2016-10-20 NOTE — Patient Instructions (Signed)

## 2016-10-25 ENCOUNTER — Encounter: Payer: Self-pay | Admitting: Gastroenterology

## 2016-10-25 ENCOUNTER — Ambulatory Visit (AMBULATORY_SURGERY_CENTER): Payer: PPO | Admitting: Gastroenterology

## 2016-10-25 VITALS — BP 140/73 | HR 62 | Temp 98.6°F | Resp 15 | Ht 73.0 in | Wt 240.0 lb

## 2016-10-25 DIAGNOSIS — Z86010 Personal history of colonic polyps: Secondary | ICD-10-CM

## 2016-10-25 DIAGNOSIS — Z1211 Encounter for screening for malignant neoplasm of colon: Secondary | ICD-10-CM | POA: Diagnosis not present

## 2016-10-25 DIAGNOSIS — R194 Change in bowel habit: Secondary | ICD-10-CM | POA: Diagnosis not present

## 2016-10-25 DIAGNOSIS — K5730 Diverticulosis of large intestine without perforation or abscess without bleeding: Secondary | ICD-10-CM | POA: Diagnosis not present

## 2016-10-25 DIAGNOSIS — Z800 Family history of malignant neoplasm of digestive organs: Secondary | ICD-10-CM | POA: Diagnosis not present

## 2016-10-25 MED ORDER — SODIUM CHLORIDE 0.9 % IV SOLN: 500.0000 mL | INTRAVENOUS | Status: DC

## 2016-10-25 NOTE — Patient Instructions (Signed)
YOU HAD AN ENDOSCOPIC PROCEDURE TODAY AT THE [facility]:   Refer to the procedure report that was given to you for any specific questions about what was found during the examination.  If the procedure report does not answer your questions, please call your gastroenterologist to clarify.  If you requested that your care partner not be given the details of your procedure findings, then the procedure report has been included in a sealed envelope for you to review at your convenience later.  YOU SHOULD EXPECT: Some feelings of bloating in the abdomen. Passage of more gas than usual.  Walking can help get rid of the air that was put into your GI tract during the procedure and reduce the bloating. If you had a lower endoscopy (such as a colonoscopy or flexible sigmoidoscopy) you may notice spotting of blood in your stool or on the toilet paper. If you underwent a bowel prep for your procedure, you may not have a normal bowel movement for a few days.  Please Note:  You might notice some irritation and congestion in your nose or some drainage.  This is from the oxygen used during your procedure.  There is no need for concern and it should clear up in a day or so.  SYMPTOMS TO REPORT IMMEDIATELY:   Following lower endoscopy (colonoscopy or flexible sigmoidoscopy):  Excessive amounts of blood in the stool  Significant tenderness or worsening of abdominal pains  Swelling of the abdomen that is new, acute  Fever of 100F or higher    For urgent or emergent issues, a gastroenterologist can be reached at any hour by calling [phone].   DIET:  We do recommend a small meal at first, but then you may proceed to your regular diet.  Drink plenty of fluids but you should avoid alcoholic beverages for 24 hours.  ACTIVITY:  You should plan to take it easy for the rest of today and you should NOT DRIVE or use heavy machinery until tomorrow (because of the sedation medicines used during the test).     FOLLOW UP: Our staff will call the number listed on your records the next business day following your procedure to check on you and address any questions or concerns that you may have regarding the information given to you following your procedure. If we do not reach you, we will leave a message.  However, if you are feeling well and you are not experiencing any problems, there is no need to return our call.  We will assume that you have returned to your regular daily activities without incident.  If any biopsies were taken you will be contacted by phone or by letter within the next 1-3 weeks.  Please call us at [phone] if you have not heard about the biopsies in 3 weeks.    SIGNATURES/CONFIDENTIALITY: You and/or your care partner have signed paperwork which will be entered into your electronic medical record.  These signatures attest to the fact that that the information above on your After Visit Summary has been reviewed and is understood.  Full responsibility of the confidentiality of this discharge information lies with you and/or your care-partner.   Resume medications. Information given on diverticulosis. 

## 2016-10-25 NOTE — Progress Notes (Signed)
A/ox3 pleased with MAC, report to Sheila RN 

## 2016-10-25 NOTE — Progress Notes (Signed)
Pt's states no medical or surgical changes since previsit or office visit. 

## 2016-10-25 NOTE — Progress Notes (Signed)
Called to room to assist during endoscopic procedure.  Patient ID and intended procedure confirmed with present staff. Received instructions for my participation in the procedure from the performing physician.  

## 2016-10-25 NOTE — Op Note (Signed)
[place] Patient Name: John Blackburn Procedure Date: 10/25/2016 1:53 PM MRN: [id number] Endoscopist: Milus Banister , MD Age: 71 Referring MD:  Date of Birth: Jan 14, 1946 Gender: Male Account #: [id number] Procedure:                Colonoscopy Indications:              Change in bowel habits (looser, more urgent than                            usual), also FH of colon cancer, last colonoscopy                            2015 with subCM adenoma Medicines:                Monitored Anesthesia Care Procedure:                Pre-Anesthesia Assessment:                           - Prior to the procedure, a History and Physical                            was performed, and patient medications and                            allergies were reviewed. The patient's tolerance of                            previous anesthesia was also reviewed. The risks                            and benefits of the procedure and the sedation                            options and risks were discussed with the patient.                            All questions were answered, and informed consent                            was obtained. Prior Anticoagulants: The patient has                            taken no previous anticoagulant or antiplatelet                            agents. ASA Grade Assessment: II - A patient with                            mild systemic disease. After reviewing the risks                            and benefits, the patient was deemed in  satisfactory condition to undergo the procedure.                           After obtaining informed consent, the colonoscope                            was passed under direct vision. Throughout the                            procedure, the patient's blood pressure, pulse, and                            oxygen saturations were monitored continuously. The                            Model CF-HQ190L [id number]) scope  was introduced                            through the anus and advanced to the the cecum,                            identified by appendiceal orifice and ileocecal                            valve. The colonoscopy was performed without                            difficulty. The patient tolerated the procedure                            well. The quality of the bowel preparation was                            good. The ileocecal valve, appendiceal orifice, and                            rectum were photographed. Scope In: 1:58:22 PM Scope Out: 2:18:17 PM Scope Withdrawal Time: 0 hours 13 minutes 16 seconds  Total Procedure Duration: 0 hours 19 minutes 55 seconds  Findings:                 Multiple small and large-mouthed diverticula were                            found in the left colon.                           The exam was otherwise without abnormality on                            direct and retroflexion views.                           Biopsies for histology were taken with a cold  forceps from the entire colon for evaluation of                            microscopic colitis. Complications:            No immediate complications. Estimated blood loss:                            None. Estimated Blood Loss:     Estimated blood loss: none. Impression:               - Diverticulosis in the left colon.                           - The examination was otherwise normal on direct                            and retroflexion views.                           - Biopsies were taken with a cold forceps from the                            entire colon for evaluation of microscopic colitis. Recommendation:           - Patient has a contact number available for                            emergencies. The signs and symptoms of potential                            delayed complications were discussed with the                            patient. Return to normal activities  tomorrow.                            Written discharge instructions were provided to the                            patient.                           - Resume previous diet.                           - Continue present medications.                           - Repeat colonoscopy in 5 years for surveillance.                           - Await pathology results. Milus Banister, MD 10/25/2016 2:22:55 PM This report has been signed electronically.

## 2016-10-26 ENCOUNTER — Telehealth: Payer: Self-pay | Admitting: *Deleted

## 2016-10-26 NOTE — Telephone Encounter (Signed)
Left message on f/u callback 

## 2016-11-01 ENCOUNTER — Encounter: Payer: Self-pay | Admitting: Gastroenterology

## 2016-11-01 ENCOUNTER — Ambulatory Visit: Payer: PPO | Admitting: Gastroenterology

## 2016-11-22 ENCOUNTER — Ambulatory Visit (INDEPENDENT_AMBULATORY_CARE_PROVIDER_SITE_OTHER): Payer: PPO | Admitting: Physician Assistant

## 2016-11-22 ENCOUNTER — Telehealth: Payer: Self-pay

## 2016-11-22 ENCOUNTER — Encounter: Payer: Self-pay | Admitting: Physician Assistant

## 2016-11-22 VITALS — BP 120/70 | HR 72 | Ht 73.0 in | Wt 238.8 lb

## 2016-11-22 DIAGNOSIS — R197 Diarrhea, unspecified: Secondary | ICD-10-CM | POA: Diagnosis not present

## 2016-11-22 DIAGNOSIS — R159 Full incontinence of feces: Secondary | ICD-10-CM | POA: Diagnosis not present

## 2016-11-22 NOTE — Progress Notes (Signed)
Chief Complaint: Diarrhea  HPI:  John Blackburn is a 71 year old Caucasian male who returns to clinic today for follow-up of his diarrhea. He regularly follows with Dr. Ardis Hughs.    Please recall he was last seen in clinic by me on 10/20/16 with a complaint of a large family history of colon cancer and a change in his bowel movements over the past 2 months noting "very small bits", which sometimes were loose, but "not every day", he also described some episodes of fecal incontinence when he coughed or sneezed. Patient was scheduled for a colonoscopy at that time and told to use MiraLAX daily for suspected overflow constipation. Colonoscopy completed 10/25/16 showed diverticulosis in the left colon and an otherwise normal exam. Biopsies were done for microscopic colitis which returned negative.   Today, the patient tells me he has been taking one MiraLAX dose on a daily basis and his symptoms have increased. He tells me that he will have a liquid stool almost every day. He also describes sometimes passing some "clear liquid/mucus". Patient also describes increased amounts of fecal incontinence noting this always happens when he is "straining somehow", this last happened when he was getting up from a bench at [place]. Patient continues to tell me that this is embarrassing and has hindered his "dating life".   Social history is positive for a high amount of sugar-free drinks and substances in his diet.   Patient denies fever, chills, blood in the stool, melena, weight loss, anorexia, nausea, vomiting, reflux or abdominal pain.  Past Medical History:  Diagnosis Date  . Allergy   . 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 Clotting disorder [facility])    November 2014, no longer on blood thinngers  . Depression    on multiple meds. Has been seen at [facility]. and Dr. Sabra Heck is his prescriber  . Diverticulitis of colon 2000 's   treated as an outpatient  . GERD (gastroesophageal reflux disease)     UGI done August '12 - ulcer/. Dr. Ferdinand Lango, gastroenterologist in [facility]   [name] Hx of adenomatous colonic polyps   . Hyperlipidemia    last lipid panel: HDL 44, LDL 117  . Hypertension   . Peptic ulcer    in the past and just recently-August '12  . Pulmonary emboli [facility])    noted October 2014 - treated by Dr. Linda Hedges  . skin cancer    skin CA  . Sleep apnea, primary central    wears CPAP    Past Surgical History:  Procedure Laterality Date  . CHOLECYSTECTOMY  1990   laproscopic   . HERNIA REPAIR    . JOINT REPLACEMENT     knee  . PROSTATE SURGERY     needle biopsy's, TURP  . TKR bilateral  2006   Dr. Violet Baldy  . UPPER GASTROINTESTINAL ENDOSCOPY    . VASECTOMY      Current Outpatient Prescriptions  Medication Sig Dispense Refill  . atenolol-chlorthalidone (TENORETIC) 50-25 MG tablet Take 1 tablet by mouth daily. 90 tablet 3  . buPROPion (WELLBUTRIN XL) 300 MG 24 hr tablet TAKE ONE TABLET BY MOUTH ONCE DAILY 30 tablet 0  . Cholecalciferol 1000 UNITS tablet Take by mouth.    [name] ipratropium (ATROVENT) 0.06 % nasal spray Place 2 sprays into both nostrils 4 (four) times daily. 15 mL 12  . Magnesium 250 MG TABS Take by mouth.    . Melatonin 1 MG/4ML LIQD Take by mouth at bedtime.    [name]  Multiple Vitamin (MULTI-VITAMINS) TABS Take by mouth.    . Polyethylene Glycol 3350 (MIRALAX PO) Take 17 g by mouth daily.    . sertraline (ZOLOFT) 50 MG tablet TAKE ONE TABLET BY MOUTH ONCE DAILY 30 tablet 2  . tadalafil (CIALIS) 10 MG tablet Take 1 tablet (10 mg total) by mouth daily as needed for erectile dysfunction. 10 tablet 0  . Turmeric 500 MG CAPS Take by mouth.     Current Facility-Administered Medications  Medication Dose Route Frequency Provider Last Rate Last Dose  . 0.9 %  sodium chloride infusion  500 mL Intravenous Continuous Milus Banister, MD        Allergies as of 11/22/2016 - Review Complete 11/22/2016  Allergen Reaction Noted  . Cephalexin Rash 05/03/2011  .  Levofloxacin Rash 05/03/2011    Family History  Problem Relation Age of Onset  . Stroke Mother   . Hypertension Mother   . Heart disease Mother   . Heart disease Father     CAD/MI-fatal sudden death  . Colon cancer Father   . Colon cancer Sister     colon cancer 20090-survivor  . Heart disease Paternal Uncle   . Colon cancer Paternal Uncle   . Heart disease Paternal Grandfather   . Colon cancer Paternal Aunt   . Colon cancer Cousin   . Colon cancer Cousin   . Colon cancer Paternal Uncle   . Stomach cancer Neg Hx   . Rectal cancer Neg Hx   . Esophageal cancer Neg Hx     Social History   Social History  . Marital status: Divorced    Spouse name: N/A  . Number of children: 3  . Years of education: 89   Occupational History  . contractor    Social History Main Topics  . Smoking status: Never Smoker  . Smokeless tobacco: Never Used  . Alcohol use 0.5 oz/week    1 Standard drinks or equivalent per week     Comment: social  . Drug use: No  . Sexual activity: Not Currently    Partners: Female   Other Topics Concern  . 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 3 sons - '74, '76, '87.  1 grandchild.   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 No pets. Serially monogamous.   ACP - he has a living will: DNR, DNI.    Review of Systems:    Constitutional: No weight loss, fever or chills Cardiovascular: No chest pain Respiratory: No SOB Gastrointestinal: See HPI and otherwise negative   Physical Exam:  Vital signs: BP 120/70   Pulse 72   Ht 6\' 1"  (1.854 m)   Wt 238 lb 12.8 oz (108.3 kg)   BMI 31.51 kg/m   Constitutional:   Pleasant Caucasian male appears to be in NAD, Well developed, Well nourished, alert and cooperative Respiratory: Respirations even and unlabored. Lungs clear to auscultation bilaterally.   No wheezes, crackles, or rhonchi.  Cardiovascular: Normal S1, S2. No  MRG. Regular rate and rhythm. No peripheral edema, cyanosis or pallor.  Gastrointestinal:  Soft, nondistended, nontender. No rebound or guarding. Normal bowel sounds. No appreciable masses or hepatomegaly. Psychiatric:Demonstrates good judgement and reason without abnormal affect or behaviors.  NO recent labs or imaging.  Assessment: 1. Diarrhea: Continues per patient, at time of last visit was it sounded as though it was overflow constipation, patient was placed on MiraLAX once daily  but his symptoms have increased since then with  more episodes of fecal incontinence which are "quite embarrassing" for the patient. Did review his medication list today and there are several medications which could be giving him diarrhea including magnesium, tumeric and his various sugar free drinks, discussed that he should try stopping these 2. Fecal incontinence: related to above; question poor sphincter tone  Plan: 1. Scheduled anorectal manometry for the patient for his fecal incontinence which is the most embarrassing symptom for him, did tell him to call our office if his symptoms stopped after trial of discontinuing things above 2. Recommend he discontinue his MiraLAX, tumeric, magnesium and sugar free drinks 3. Ordered stool studies to include a GI pathogen panel and O&P 4. Patient to follow in clinic in 3-4 weeks myself or Dr. Jerilynn Birkenhead, PA-C [facility] 11/22/2016, 2:40 PM  Cc: John Blackburn, *

## 2016-11-22 NOTE — Telephone Encounter (Signed)
Left message for patient to stop taking the magnesium to see if this does not help with his symptoms. Asked him to call back if he has questions or concerns.

## 2016-11-22 NOTE — Patient Instructions (Addendum)
Stop Miralax and tumeric and sugar free drinks.   Your physician has requested that you go to the basement for lab work before leaving today.   You have been scheduled to have an anorectal manometry at [facility] on 11-30-16 at 12:30 pm. Please arrive 30 minutes prior to your appointment time for registration (1st floor of the hospital-admissions).  Please make certain to use 1 Fleets enema 2 hours prior to coming for your appointment. You can purchase Fleets enemas from the laxative section at your drug store. You should not eat anything during the two hours prior to the procedure. You may take regular medications with small sips of water at least 2 hours prior to the study.      Anorectal manometry is a test performed to evaluate patients with constipation or fecal incontinence. This test measures the pressures of the anal sphincter muscles, the sensation in the rectum, and the neural reflexes that are needed for normal bowel movements.  THE PROCEDURE The test takes approximately 30 minutes to 1 hour. You will be asked to change into a hospital gown. A technician or nurse will explain the procedure to you, take a brief health history, and answer any questions you may have. The patient then lies on his or her left side. A small, flexible tube, about the size of a thermometer, with a balloon at the end is inserted into the rectum. The catheter is connected to a machine that measures the pressure. During the test, the small balloon attached to the catheter may be inflated in the rectum to assess the normal reflex pathways. The nurse or technician may also ask the person to squeeze, relax, and push at various times. The anal sphincter muscle pressures are measured during each of these maneuvers. To squeeze, the patient tightens the sphincter muscles as if trying to prevent anything from coming out. To push or bear down, the patient strains down as if trying to have a bowel movement.

## 2016-11-22 NOTE — Telephone Encounter (Signed)
-----   Message from Levin Erp, [place] sent at 11/22/2016  3:25 PM EDT ----- Regarding: Diarrhea Can you please call patient and tell him to hold his Magnesium-I did not see this on his med list previously and this could be causing diarrhea. Thank you-JLL

## 2016-11-23 ENCOUNTER — Telehealth: Payer: Self-pay | Admitting: Gastroenterology

## 2016-11-23 NOTE — Telephone Encounter (Signed)
Patient couldn't find his instructions from the lab on how to collect his stool specimen. I obtained instructions from the lab and read these to the patient. Questions invited and answered. He thanked me for my help.

## 2016-11-23 NOTE — Progress Notes (Signed)
I agree with the above note, plan 

## 2016-11-24 ENCOUNTER — Other Ambulatory Visit: Payer: Self-pay | Admitting: Internal Medicine

## 2016-11-24 ENCOUNTER — Other Ambulatory Visit: Payer: PPO

## 2016-11-24 DIAGNOSIS — R197 Diarrhea, unspecified: Secondary | ICD-10-CM | POA: Diagnosis not present

## 2016-11-24 DIAGNOSIS — R159 Full incontinence of feces: Secondary | ICD-10-CM

## 2016-11-24 DIAGNOSIS — F329 Major depressive disorder, single episode, unspecified: Secondary | ICD-10-CM

## 2016-11-24 DIAGNOSIS — F32A Depression, unspecified: Secondary | ICD-10-CM

## 2016-11-25 LAB — OVA AND PARASITE EXAMINATION: OP: NONE SEEN

## 2016-11-28 LAB — GASTROINTESTINAL PATHOGEN PANEL PCR
C. difficile Tox A/B, PCR: NOT DETECTED
Campylobacter, PCR: NOT DETECTED
Cryptosporidium, PCR: NOT DETECTED
E COLI (ETEC) LT/ST, PCR: NOT DETECTED
E COLI 0157, PCR: NOT DETECTED
E coli (STEC) stx1/stx2, PCR: NOT DETECTED
Giardia lamblia, PCR: NOT DETECTED
Norovirus, PCR: NOT DETECTED
Rotavirus A, PCR: NOT DETECTED
SALMONELLA, PCR: NOT DETECTED
SHIGELLA, PCR: NOT DETECTED

## 2016-11-30 ENCOUNTER — Encounter (HOSPITAL_COMMUNITY)
Admission: RE | Disposition: A | Discharge status: Home / Self Care | Payer: Self-pay | Source: Ambulatory Visit | Attending: Gastroenterology

## 2016-11-30 ENCOUNTER — Encounter (HOSPITAL_COMMUNITY): Payer: Self-pay | Admitting: Gastroenterology

## 2016-11-30 ENCOUNTER — Ambulatory Visit (HOSPITAL_COMMUNITY)
Admission: RE | Admit: 2016-11-30 | Discharge: 2016-11-30 | Disposition: A | Discharge status: Home / Self Care | Payer: PPO | Source: Ambulatory Visit | Attending: Gastroenterology | Admitting: Gastroenterology

## 2016-11-30 DIAGNOSIS — I10 Essential (primary) hypertension: Secondary | ICD-10-CM | POA: Insufficient documentation

## 2016-11-30 DIAGNOSIS — Z823 Family history of stroke: Secondary | ICD-10-CM | POA: Diagnosis not present

## 2016-11-30 DIAGNOSIS — Z8249 Family history of ischemic heart disease and other diseases of the circulatory system: Secondary | ICD-10-CM | POA: Diagnosis not present

## 2016-11-30 DIAGNOSIS — Z86711 Personal history of pulmonary embolism: Secondary | ICD-10-CM | POA: Diagnosis not present

## 2016-11-30 DIAGNOSIS — E785 Hyperlipidemia, unspecified: Secondary | ICD-10-CM | POA: Insufficient documentation

## 2016-11-30 DIAGNOSIS — K6289 Other specified diseases of anus and rectum: Secondary | ICD-10-CM | POA: Diagnosis not present

## 2016-11-30 DIAGNOSIS — Z96653 Presence of artificial knee joint, bilateral: Secondary | ICD-10-CM | POA: Insufficient documentation

## 2016-11-30 DIAGNOSIS — Z881 Allergy status to other antibiotic agents status: Secondary | ICD-10-CM | POA: Diagnosis not present

## 2016-11-30 DIAGNOSIS — F329 Major depressive disorder, single episode, unspecified: Secondary | ICD-10-CM | POA: Insufficient documentation

## 2016-11-30 DIAGNOSIS — Z86010 Personal history of colonic polyps: Secondary | ICD-10-CM | POA: Insufficient documentation

## 2016-11-30 DIAGNOSIS — Z9049 Acquired absence of other specified parts of digestive tract: Secondary | ICD-10-CM | POA: Insufficient documentation

## 2016-11-30 DIAGNOSIS — Z79899 Other long term (current) drug therapy: Secondary | ICD-10-CM | POA: Insufficient documentation

## 2016-11-30 DIAGNOSIS — Z800 Family history of malignant neoplasm of digestive organs: Secondary | ICD-10-CM | POA: Insufficient documentation

## 2016-11-30 DIAGNOSIS — Z8711 Personal history of peptic ulcer disease: Secondary | ICD-10-CM | POA: Insufficient documentation

## 2016-11-30 DIAGNOSIS — K219 Gastro-esophageal reflux disease without esophagitis: Secondary | ICD-10-CM | POA: Insufficient documentation

## 2016-11-30 DIAGNOSIS — R159 Full incontinence of feces: Secondary | ICD-10-CM | POA: Diagnosis not present

## 2016-11-30 DIAGNOSIS — Z9889 Other specified postprocedural states: Secondary | ICD-10-CM | POA: Diagnosis not present

## 2016-11-30 DIAGNOSIS — Z9852 Vasectomy status: Secondary | ICD-10-CM | POA: Diagnosis not present

## 2016-11-30 DIAGNOSIS — G4730 Sleep apnea, unspecified: Secondary | ICD-10-CM | POA: Insufficient documentation

## 2016-11-30 DIAGNOSIS — Z85828 Personal history of other malignant neoplasm of skin: Secondary | ICD-10-CM | POA: Insufficient documentation

## 2016-11-30 HISTORY — PX: ANAL RECTAL MANOMETRY: SHX6358

## 2016-11-30 SURGERY — MANOMETRY, ANORECTAL

## 2016-11-30 NOTE — Progress Notes (Signed)
Anal Manometry done per protocol. Pt tolerated well without complication. Report to be sent to Dr. Nandigam. 

## 2016-12-01 DIAGNOSIS — R159 Full incontinence of feces: Secondary | ICD-10-CM

## 2016-12-19 ENCOUNTER — Telehealth: Payer: Self-pay

## 2016-12-19 NOTE — Telephone Encounter (Signed)
Milus Banister, MD sent to Jeoffrey Massed, RN          Please call the patient. May need biofeedback referral but first I want him to try bulking his stool with fiber supplement. Stop miralax and start citrucel; small amount at first then working up to full heaping spoonful daily. rov with me in 6-7 weeks. Stay on fiber till then

## 2016-12-19 NOTE — Telephone Encounter (Signed)
Pt was re instructed on Citrucel and will call back with any further concerns

## 2016-12-29 ENCOUNTER — Other Ambulatory Visit: Payer: Self-pay | Admitting: Internal Medicine

## 2016-12-29 DIAGNOSIS — F32A Depression, unspecified: Secondary | ICD-10-CM

## 2016-12-29 DIAGNOSIS — F329 Major depressive disorder, single episode, unspecified: Secondary | ICD-10-CM

## 2017-01-13 ENCOUNTER — Telehealth: Payer: Self-pay | Admitting: Internal Medicine

## 2017-01-13 NOTE — Telephone Encounter (Signed)
Pt would like a call back has questions about test results.

## 2017-01-16 NOTE — Telephone Encounter (Signed)
Please advise, not sure what test results, or what tests were done

## 2017-01-16 NOTE — Telephone Encounter (Signed)
I do not know. I have not ordered anything for him since December. If he is talking about things done in the hospital would be happy to discuss at hospital follow up. If another provider ordered then he should call that provider for results.

## 2017-01-16 NOTE — Telephone Encounter (Signed)
Was talking about labs done in march. Please call back  Concerned about Hemoglobin

## 2017-01-16 NOTE — Telephone Encounter (Signed)
LVM again stating that we have not seen him since December of 2017 that if they were done in a different office or ordered by a different provider then to call them, and he can call back if  He has any questions

## 2017-01-16 NOTE — Telephone Encounter (Signed)
LVM with patient to call back

## 2017-01-17 ENCOUNTER — Other Ambulatory Visit (INDEPENDENT_AMBULATORY_CARE_PROVIDER_SITE_OTHER): Payer: PPO

## 2017-01-17 ENCOUNTER — Ambulatory Visit (INDEPENDENT_AMBULATORY_CARE_PROVIDER_SITE_OTHER): Payer: PPO | Admitting: Internal Medicine

## 2017-01-17 ENCOUNTER — Encounter: Payer: Self-pay | Admitting: Internal Medicine

## 2017-01-17 VITALS — BP 120/72 | HR 62 | Temp 97.5°F | Resp 12 | Ht 73.0 in | Wt 236.0 lb

## 2017-01-17 DIAGNOSIS — R739 Hyperglycemia, unspecified: Secondary | ICD-10-CM | POA: Insufficient documentation

## 2017-01-17 DIAGNOSIS — G4733 Obstructive sleep apnea (adult) (pediatric): Secondary | ICD-10-CM

## 2017-01-17 DIAGNOSIS — L409 Psoriasis, unspecified: Secondary | ICD-10-CM | POA: Diagnosis not present

## 2017-01-17 LAB — HEMOGLOBIN A1C: Hgb A1c MFr Bld: 5.9 % (ref 4.6–6.5)

## 2017-01-17 NOTE — Assessment & Plan Note (Signed)
Checking HgA1c, prior HgA1c without evidence for diabetes. No clinical signs of diabetes. We talked about diet and exercise as a way to keep sugars more level throughout the day.

## 2017-01-17 NOTE — Progress Notes (Signed)
   Subjective:    Patient ID: John Blackburn, male    DOB: January 02, 1946, 71 y.o.   MRN: [id number]  HPI The patient is a 72 YO man coming in for high sugar on home screening with [facility]. He does not have history of diabetes. Not eating more sugar before the test. He denies excessive thirst or urination. Denies numbness or tingling in hands or feet.   Review of Systems  Constitutional: Negative for activity change, appetite change, fatigue, fever and unexpected weight change.  Respiratory: Negative.   Cardiovascular: Negative.   Gastrointestinal: Negative.   Endocrine: Negative for cold intolerance, heat intolerance, polydipsia, polyphagia and polyuria.  Neurological: Negative.       Objective:   Physical Exam  Constitutional: He is oriented to person, place, and time. He appears well-developed and well-nourished.  HENT:  Head: Normocephalic and atraumatic.  Eyes: EOM are normal.  Neck: Normal range of motion.  Cardiovascular: Normal rate and regular rhythm.   Pulmonary/Chest: Effort normal. No respiratory distress. He has no wheezes. He has no rales.  Abdominal: Soft. He exhibits no distension. There is no tenderness. There is no rebound.  Neurological: He is alert and oriented to person, place, and time.  Skin: Skin is warm and dry.   Vitals:   01/17/17 1527  BP: 120/72  Pulse: 62  Resp: 12  Temp: 97.5 F (36.4 C)  TempSrc: Oral  SpO2: 99%  Weight: 236 lb (107 kg)  Height: 6\' 1"  (1.854 m)      Assessment & Plan:

## 2017-01-17 NOTE — Patient Instructions (Addendum)
We will check the labs today and get you in with a skin doctor and the sleep apnea.

## 2017-01-27 ENCOUNTER — Ambulatory Visit (INDEPENDENT_AMBULATORY_CARE_PROVIDER_SITE_OTHER): Payer: PPO | Admitting: Gastroenterology

## 2017-01-27 ENCOUNTER — Other Ambulatory Visit: Payer: Self-pay

## 2017-01-27 ENCOUNTER — Encounter: Payer: Self-pay | Admitting: Gastroenterology

## 2017-01-27 VITALS — BP 100/58 | HR 52 | Ht 73.0 in | Wt 242.0 lb

## 2017-01-27 DIAGNOSIS — R159 Full incontinence of feces: Secondary | ICD-10-CM

## 2017-01-27 NOTE — Progress Notes (Signed)
Review of pertinent gastrointestinal problems: 1. Change in bowel habits 2018 (looser, more urgent than usual, fecal incontinence), also FH of colon cancer, last colonoscopy 2015 with subCM adenoma. Colonoscopy 10/2016 Dr. Ardis Hughs diverticulosis, otherwise normal. Biopsies show no microscopic colitis. Anorectal manometry 11/2016: "Weak internal and external anal sphincters. 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 Consider referral to visible therapy for biofeedback" 2. Adenomatous polyps: 2015 Colonoscopy, subCM   HPI: This is a very pleasant 71 year old man whom I last saw the time of his colonoscopy about 3 months ago  Chief complaint is persistent intermittent fecal incontinence  After anorectal manometry: started powder fiber supplement.  Also taking a probiotic and some herbal product I am not familiar with.  He noticed slight improvement with his fecal incontinence but this is still a problem with them as often as 2 or 3 times a week. It is very aggravating as I can understand  Still having fectal incontinence 2-3 times per week.  He quit all his sugar free products.    ROS: complete GI ROS as described in HPI, all other review negative.  Constitutional:  No unintentional weight loss   Past Medical History:  Diagnosis Date  . Allergy   . 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 Clotting disorder [facility])    November 2014, no longer on blood thinngers  . Depression    on multiple meds. Has been seen at [facility]. and Dr. Sabra Heck is his prescriber  . Diverticulitis of colon 2000 's   treated as an outpatient  . GERD (gastroesophageal reflux disease)    UGI done August '12 - ulcer/. Dr. Ferdinand Lango, gastroenterologist in [facility].   [name] Hx of adenomatous colonic polyps   . Hyperlipidemia    last lipid panel: HDL 44, LDL 117  . Hypertension   . Peptic ulcer    in the past and just recently-August  '12  . Pulmonary emboli [facility])    noted October 2014 - treated by Dr. Linda Hedges  . skin cancer    skin CA  . Sleep apnea, primary central    wears CPAP    Past Surgical History:  Procedure Laterality Date  . ANAL RECTAL MANOMETRY N/A 11/30/2016   Procedure: ANO RECTAL MANOMETRY;  Surgeon: Mauri Pole, MD;  Location: [facility];  Service: Endoscopy;  Laterality: N/A;  . CHOLECYSTECTOMY  1990   laproscopic   . HERNIA REPAIR    . JOINT REPLACEMENT     knee  . PROSTATE SURGERY     needle biopsy's, TURP  . TKR bilateral  2006   Dr. Violet Baldy  . UPPER GASTROINTESTINAL ENDOSCOPY    . VASECTOMY      Current Outpatient Prescriptions  Medication Sig Dispense Refill  . atenolol-chlorthalidone (TENORETIC) 50-25 MG tablet Take 1 tablet by mouth daily. 90 tablet 3  . buPROPion (WELLBUTRIN XL) 300 MG 24 hr tablet TAKE 1 TABLET BY MOUTH EVERY DAY 30 tablet 2  . Magnesium 250 MG TABS Take by mouth.    . Melatonin 1 MG/4ML LIQD Take by mouth at bedtime.    . Multiple Vitamin (MULTI-VITAMINS) TABS Take by mouth.    . Polyethylene Glycol 3350 (MIRALAX PO) Take 17 g by mouth daily.    . sertraline (ZOLOFT) 50 MG tablet TAKE 1 TABLET BY MOUTH EVERY DAY 90 tablet 0  . tadalafil (CIALIS) 10 MG tablet Take 1 tablet (10 mg total) by mouth daily as  needed for erectile dysfunction. 10 tablet 0  . Turmeric 500 MG CAPS Take by mouth.     No current facility-administered medications for this visit.     Allergies as of 01/27/2017 - Review Complete 01/27/2017  Allergen Reaction Noted  . Cephalexin Rash 05/03/2011  . Levofloxacin Rash 05/03/2011    Family History  Problem Relation Age of Onset  . Stroke Mother   . Hypertension Mother   . Heart disease Mother   . Heart disease Father        CAD/MI-fatal sudden death  . Colon cancer Father   . Colon cancer Sister        colon cancer 20090-survivor  . Heart disease Paternal Uncle   . Colon cancer Paternal Uncle   . Heart disease  Paternal Grandfather   . Colon cancer Paternal Aunt   . Colon cancer Cousin   . Colon cancer Cousin   . Colon cancer Paternal Uncle   . Stomach cancer Neg Hx   . Rectal cancer Neg Hx   . Esophageal cancer Neg Hx     Social History   Social History  . Marital status: Divorced    Spouse name: N/A  . Number of children: 3  . Years of education: 56   Occupational History  . contractor    Social History Main Topics  . Smoking status: Never Smoker  . Smokeless tobacco: Never Used  . Alcohol use 0.5 oz/week    1 Standard drinks or equivalent per week     Comment: social  . Drug use: No  . Sexual activity: Not Currently    Partners: Female   Other Topics Concern  . 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 3 sons - '74, '76, '87.  1 grandchild.   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 No pets. Serially monogamous.   ACP - he has a living will: DNR, DNI.     Physical Exam: BP (!) 100/58   Pulse (!) 52   Ht 6\' 1"  (1.854 m)   Wt 242 lb (109.8 kg)   BMI 31.93 kg/m  Constitutional: generally well-appearing Psychiatric: alert and oriented x3 Abdomen: soft, nontender, nondistended, no obvious ascites, no peritoneal signs, normal bowel sounds No peripheral edema noted in lower extremities  Assessment and plan: 71 y.o. 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 I am going to arrange referral to physical therapist who specializes in anal rectal biofeedback. He'll return to see me in 2 months and sooner if needed. He will continue on bulking agents for his stool for now.  Please see the "Patient Instructions" section for addition details about the plan.  Owens Loffler, MD [facility] 01/27/2017, 11:01 AM

## 2017-01-27 NOTE — Patient Instructions (Addendum)
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 We will call you with the appointment date and time.  Please call this office if you have not heard from them or [place] in 1 week.    Please return to see Dr. Ardis Hughs in 2 months.

## 2017-02-14 ENCOUNTER — Ambulatory Visit: Payer: PPO | Attending: Gastroenterology | Admitting: Physical Therapy

## 2017-02-14 ENCOUNTER — Encounter: Payer: Self-pay | Admitting: Physical Therapy

## 2017-02-14 DIAGNOSIS — M6281 Muscle weakness (generalized): Secondary | ICD-10-CM | POA: Insufficient documentation

## 2017-02-14 DIAGNOSIS — R278 Other lack of coordination: Secondary | ICD-10-CM | POA: Insufficient documentation

## 2017-02-14 NOTE — Patient Instructions (Addendum)
Quick Contraction: Gravity Resisted (Sitting)    Sitting, quickly squeeze then fully relax pelvic floor. Perform __1_ sets of ___. Rest for _1__ seconds between sets. Do __4_ times a day.  Copyright  VHI. All rights reserved.  Slow Contraction: Gravity Resisted (Sitting)    Sitting, slowly squeeze pelvic floor for _5__ seconds. Rest for _5__ seconds. Repeat __10_ times. Do _4__ times a day.  Copyright  VHI. All rights reserved.  [place] [address] [place], [place] [address] Phone # [phone] Fax [phone]

## 2017-02-14 NOTE — Therapy (Signed)
[facility] [address] [place], [place], [address] Phone: [phone]   Fax:  [phone]  Physical Therapy Evaluation  Patient Details  Name: John Blackburn MRN: [id number] Date of Birth: 1946/02/12 Referring Provider: Dr. Milus Banister  Encounter Date: 02/14/2017      PT End of Session - 02/14/17 1546    Visit Number 1   Number of Visits 10   Date for PT Re-Evaluation 04/11/17   Authorization Type medicare 10th visit g-code; Kx on 15th   PT Start Time 1445   PT Stop Time 1545   PT Time Calculation (min) 60 min   Activity Tolerance Patient tolerated treatment well   Behavior During Therapy [facility] for tasks assessed/performed      Past Medical History:  Diagnosis Date  . Allergy   . 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 Clotting disorder [facility])    November 2014, no longer on blood thinngers  . Depression    on multiple meds. Has been seen at [facility]. and Dr. Sabra Heck is his prescriber  . Diverticulitis of colon 2000 's   treated as an outpatient  . GERD (gastroesophageal reflux disease)    UGI done August '12 - ulcer/. Dr. Ferdinand Lango, gastroenterologist in [facility].   [name] Hx of adenomatous colonic polyps   . Hyperlipidemia    last lipid panel: HDL 44, LDL 117  . Hypertension   . Peptic ulcer    in the past and just recently-August '12  . Pulmonary emboli [facility])    noted October 2014 - treated by Dr. Linda Hedges  . skin cancer    skin CA  . Sleep apnea, primary central    wears CPAP    Past Surgical History:  Procedure Laterality Date  . ANAL RECTAL MANOMETRY N/A 11/30/2016   Procedure: ANO RECTAL MANOMETRY;  Surgeon: Mauri Pole, MD;  Location: [facility];  Service: Endoscopy;  Laterality: N/A;  . CHOLECYSTECTOMY  1990   laproscopic   . HERNIA REPAIR    . JOINT REPLACEMENT     knee  . PROSTATE SURGERY     needle biopsy's, TURP  . TKR bilateral  2006   Dr.  Violet Baldy  . UPPER GASTROINTESTINAL ENDOSCOPY    . VASECTOMY      There were no vitals filed for this visit.       Subjective Assessment - 02/14/17 1453    Subjective Patient reports 3 months ago problem started to have a bowel movement without a warning.  He will mess up his clothes. Pateint started to take diet drinks when the problem began.    Patient Stated Goals control of bowels; be able socialize    Currently in Pain? No/denies            [facility] PT Assessment - 02/14/17 0001      Assessment   Medical Diagnosis R15.9 Incontinence of feces, unspecified fecal incontinence   Referring Provider Dr. Milus Banister   Onset Date/Surgical Date 11/03/16   Prior Therapy None     Precautions   Precautions None     Restrictions   Weight Bearing Restrictions No     Balance Screen   Has the patient fallen in the past 6 months Yes   How many times? 2  due to clutter on the floor in his apartment   Has the patient had a decrease in activity level because of a fear of falling?  No  Is the patient reluctant to leave their home because of a fear of falling?  No     Home Environment   Living Environment Private residence   Living Arrangements Alone   Type of [place]     Prior Function   Level of [place] Retired   Leisure walk 1 mile daily     Cognition   Overall Cognitive Status Within Functional Limits for tasks assessed     Observation/Other Assessments   Focus on Therapeutic Outcomes (FOTO)  therapist discrection is 30% limitation due to not being able to socialize due to fecal incontinence     Posture/Postural Control   Posture/Postural Control Postural limitations   Postural Limitations Flexed trunk     ROM / Strength   AROM / PROM / Strength AROM;Strength     AROM   Overall AROM Comments hip extension bil. is 0 degrees   Lumbar Extension decreased by 75%   Lumbar - Right Side Bend decreased by 50%   Lumbar - Left Side  Bend decreased by 50%     Strength   Right Hip Extension 3/5   Right Hip ABduction 3+/5   Left Hip Extension 3/5   Left Hip ABduction 3+/5     Palpation   SI assessment  pelvis in correct alignment     Transfers   Transfers Not assessed     Ambulation/Gait   Ambulation/Gait No            Objective measurements completed on examination: See above findings.        Pelvic Floor Special Questions - 02/14/17 0001    Urinary Leakage No   Fecal incontinence Yes  1-2 time per week for leakage   Biofeedback rest 0.51 uv; 3 quick contractions max 11.70 uv, avg. 3.01 uv; 10 second 2.78 uv;  20 second 3.11 uv; rest after exercise 0.46 uv   Biofeedback sensor type Rectal  surface electrode   Biofeedback Activity Other;10 second hold;Quick contraction  assessment                  PT Education - 02/14/17 1547    Education provided Yes   Education Details pelvic floor exercise in sitting   Person(s) Educated Patient   Methods Explanation;Verbal cues;Handout   Comprehension Verbalized understanding;Returned demonstration          PT Short Term Goals - 02/14/17 1606      PT SHORT TERM GOAL #1   Title independent with initial HEP for hip and abdominal strength   Time 4   Period Weeks   Status New     PT SHORT TERM GOAL #2   Title able to contract pelvic floor without contracting gluteals or hold his breath   Time 4   Period Weeks   Status New     PT SHORT TERM GOAL #3   Title resting tone for the pelvic floor improved to 1 uv due to increase strength   Time 4   Period Weeks   Status New     PT SHORT TERM GOAL #4   Title fecal incontinence decreased >/= 25% due to increase strength   Time 4   Period Weeks   Status New           PT Long Term Goals - 02/14/17 1608      PT LONG TERM GOAL #1   Title independent with HEP and understands how to progress herself   Time 8  Period Weeks   Status New     PT LONG TERM GOAL #2   Title fecal  leakage decreased by 75% due to increased strength to 10 vu with 5 second contraction   Time 8   Period Weeks   Status New     PT LONG TERM GOAL #3   Title ability to contract pelvic floor quickly without lag due to improved coordination of the pelvic floor   Time 8   Period Weeks   Status New     PT LONG TERM GOAL #4   Title improved hip strength >/= 4/5 to improve control of stool leakage   Time 8   Period Weeks   Status New                Plan - 02/14/17 1548    Clinical Impression Statement Patient is a 71 year old male with low level diagnosis of fecal incontinence that started suddenly 3 months ago.  Patient reports at that time he changed to diet drinks and has stopped since then.  Patient reports no pain.  Patient has 1-2 stool leakage per week.  Patient reports his stool will leak with no awareness.  Bilateral hip abduction and extension are weak.  Patient will contract the pelvic floor with holding his breath and substitute with gluteals.  Patient resting tone is 0.51 uv indicating low tone.  Patient has difficulty with contracting quickly due to holding breath. Patient is only able to contract pelvic floor for 5 seconds but it is a struggle.  Patient will benefit from skilled therapy to improve strength and coordination of the pelvic floor.    History and Personal Factors relevant to plan of care: None   Clinical Presentation Stable   Clinical Presentation due to: stable condition   Clinical Decision Making Low   Rehab Potential Excellent   Clinical Impairments Affecting Rehab Potential None   PT Frequency 1x / week   PT Duration 8 weeks   PT Treatment/Interventions Biofeedback;Therapeutic activities;Therapeutic exercise;Neuromuscular re-education;Patient/family education;Manual techniques   PT Next Visit Plan pelvic floor strength with EMG working on contraction without holding breath; hip stretches for extension; hip abduction and extension strength;    PT Home  Exercise Plan progress as needed   Consulted and Agree with Plan of Care Patient      Patient will benefit from skilled therapeutic intervention in order to improve the following deficits and impairments:  Decreased strength, Decreased endurance, Decreased coordination  Visit Diagnosis: Muscle weakness (generalized) - Plan: PT plan of care cert/re-cert  Other lack of coordination - Plan: PT plan of care cert/re-cert      G-Codes - 25/42/70 1610    Functional Assessment Tool Used (Outpatient Only) 30% limitation due to stool leakage   Functional Limitation Other PT primary   Other PT Primary Current Status (W2376) At least 20 percent but less than 40 percent impaired, limited or restricted   Other PT Primary Goal Status (E8315) At least 20 percent but less than 40 percent impaired, limited or restricted       Problem List Patient Active Problem List   Diagnosis Date Noted  . Elevated blood sugar 01/17/2017  . Incontinence of feces   . Family history of early CAD 08/12/2016  . Family hx of colon cancer 08/12/2016  . Allergic rhinitis 10/15/2013  . Chronic venous insufficiency 07/20/2013  . Acute pulmonary embolism ([place]) 07/08/2013  . ED (erectile dysfunction) 06/01/2011  . Laryngitis, chronic 06/01/2011  . Arthritis   .  Hypertension   . Hyperlipidemia   . Sleep apnea, primary central     Earlie Counts, PT 02/14/17 4:14 PM   [facility] [address], [place], [place], [address] Phone: [phone]   Fax:  [phone]  Name: John Blackburn MRN: [id number] Date of Birth: [birth year]

## 2017-02-17 ENCOUNTER — Ambulatory Visit (INDEPENDENT_AMBULATORY_CARE_PROVIDER_SITE_OTHER): Payer: PPO | Admitting: Pulmonary Disease

## 2017-02-17 ENCOUNTER — Encounter: Payer: Self-pay | Admitting: Pulmonary Disease

## 2017-02-17 VITALS — BP 102/62 | HR 57 | Ht 73.0 in | Wt 238.8 lb

## 2017-02-17 DIAGNOSIS — G4731 Primary central sleep apnea: Secondary | ICD-10-CM

## 2017-02-17 DIAGNOSIS — G4733 Obstructive sleep apnea (adult) (pediatric): Secondary | ICD-10-CM | POA: Diagnosis not present

## 2017-02-17 NOTE — Progress Notes (Signed)
Subjective:    Patient ID: John Blackburn, male    DOB: 1945-09-20, 71 y.o.   MRN: [id number]  HPI Chief Complaint  Patient presents with  . Sleep Consult    Referred by Dr. Pricilla Holm, Pt currently use a cpap, Pt states he is using it every night, Pt uses a nasal mask, he here to discuss obtain a new machine, Epi Worth: 6     John Blackburn tells me that he has been on CPAP for years and is here to see me for his sleep apnea.  He reoports that his machine is old and he thinks that he may need a new one. He says that his machine doesn't have a download capability and he has been using the same machine for 15 years.  He says that Dr. Annamaria Blackburn saw him for his sleep apnea years ago.    He says taht the main reason he uses it is for health benefits from CPAP, he's never had much improvement in energy level.  He reports some daytime sleepiness.  He takes a nap every day.  He reports some fatigue durin the day.  He reports no morning headaches, no trouble staying awake while driving.  He has lost 80 pounds.     Past Medical History:  Diagnosis Date  . Allergy   . 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 Clotting disorder [facility])    November 2014, no longer on blood thinngers  . Depression    on multiple meds. Has been seen at [facility]. and Dr. Sabra Heck is his prescriber  . Diverticulitis of colon 2000 's   treated as an outpatient  . GERD (gastroesophageal reflux disease)    UGI done August '12 - ulcer/. Dr. Ferdinand Lango, gastroenterologist in [facility].   [name] Hx of adenomatous colonic polyps   . Hyperlipidemia    last lipid panel: HDL 44, LDL 117  . Hypertension   . Peptic ulcer    in the past and just recently-August '12  . Pulmonary emboli [facility])    noted October 2014 - treated by Dr. Linda Hedges  . skin cancer    skin CA  . Sleep apnea, primary central    wears CPAP     Family History  Problem Relation Age of Onset  . Stroke Mother   . Hypertension Mother   .  Heart disease Mother   . Heart disease Father        CAD/MI-fatal sudden death  . Colon cancer Father   . Colon cancer Sister        colon cancer 20090-survivor  . Heart disease Paternal Uncle   . Colon cancer Paternal Uncle   . Heart disease Paternal Grandfather   . Colon cancer Paternal Aunt   . Colon cancer Cousin   . Colon cancer Cousin   . Colon cancer Paternal Uncle   . Stomach cancer Neg Hx   . Rectal cancer Neg Hx   . Esophageal cancer Neg Hx      Social History   Social History  . Marital status: Divorced    Spouse name: N/A  . Number of children: 3  . Years of education: 43   Occupational History  . contractor    Social History Main Topics  . Smoking status: Never Smoker  . Smokeless tobacco: Never Used  . Alcohol use 0.5 oz/week    1 Standard drinks or equivalent per week     Comment:  social  . Drug use: No  . Sexual activity: Not Currently    Partners: Female   Other Topics Concern  . 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 3 sons - '74, '76, '87.  1 grandchild.   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 No pets. Serially monogamous.   ACP - he has a living will: DNR, DNI.     Allergies  Allergen Reactions  . Cephalexin Rash  . Levofloxacin Rash     Outpatient Medications Prior to Visit  Medication Sig Dispense Refill  . atenolol-chlorthalidone (TENORETIC) 50-25 MG tablet Take 1 tablet by mouth daily. 90 tablet 3  . buPROPion (WELLBUTRIN XL) 300 MG 24 hr tablet TAKE 1 TABLET BY MOUTH EVERY DAY 30 tablet 2  . Magnesium 250 MG TABS Take by mouth.    . Melatonin 1 MG/4ML LIQD Take by mouth at bedtime.    . Multiple Vitamin (MULTI-VITAMINS) TABS Take by mouth.    . sertraline (ZOLOFT) 50 MG tablet TAKE 1 TABLET BY MOUTH EVERY DAY 90 tablet 0  . tadalafil (CIALIS) 10 MG tablet Take 1 tablet (10 mg total) by mouth daily as needed for erectile dysfunction. 10  tablet 0  . Turmeric 500 MG CAPS Take by mouth.    . Polyethylene Glycol 3350 (MIRALAX PO) Take 17 g by mouth daily.     No facility-administered medications prior to visit.       Review of Systems  Constitutional: Negative.  Negative for fever and unexpected weight change.  HENT: Positive for congestion, postnasal drip, sinus pressure and sneezing. Negative for dental problem, ear pain, nosebleeds, rhinorrhea, sore throat and trouble swallowing.   Eyes: Negative.  Negative for redness and itching.  Respiratory: Positive for cough. Negative for chest tightness, shortness of breath and wheezing.   Cardiovascular: Negative.  Negative for palpitations and leg swelling.  Gastrointestinal: Negative.  Negative for nausea and vomiting.  Endocrine: Negative.   Genitourinary: Negative.  Negative for dysuria.  Musculoskeletal: Negative.  Negative for joint swelling.  Skin: Negative.  Negative for rash.  Allergic/Immunologic: Negative.  Negative for environmental allergies, food allergies and immunocompromised state.  Neurological: Positive for dizziness. Negative for headaches.  Hematological: Negative.  Does not bruise/bleed easily.  Psychiatric/Behavioral: Negative.  Negative for dysphoric mood. The patient is not nervous/anxious.        Objective:   Physical Exam Vitals:   02/17/17 1021  BP: 102/62  Pulse: (!) 57  SpO2: 98%  Weight: 238 lb 12.8 oz (108.3 kg)  Height: 6\' 1"  (1.854 m)   Gen: well appearing, no acute distress HENT: NCAT, OP clear, neck supple without masses, tongue scalloping noted, mallampatti score 2 Eyes: PERRL, EOMi Lymph: no cervical lymphadenopathy PULM: CTA B CV: RRR, no mgr, no JVD GI: BS+, soft, nontender, no hsm Derm: no rash or skin breakdown MSK: normal bulk and tone Neuro: A&Ox4, CN II-XII intact, strength 5/5 in all 4 extremities Psyche: normal mood and affect   Records from primary care reviewed were his diabetes was managed earlier last  month.     Assessment & Plan:  Sleep apnea, primary central He reports some increase in sleepiness with napping frequently during the daytime. His weight has changed significantly since the last sleep study which was 15 years ago.  So he needs to have another CPAP titration study to make sure that his pressure is set appropriately and he could  benefit from a more modern machine which would give [place] compliance information.  Plan: Encouraged not to drive while feeling sleepy CPAP titration study Follow-up with sleep physician next visit    Current Outpatient Prescriptions:  .  atenolol-chlorthalidone (TENORETIC) 50-25 MG tablet, Take 1 tablet by mouth daily., Disp: 90 tablet, Rfl: 3 .  buPROPion (WELLBUTRIN XL) 300 MG 24 hr tablet, TAKE 1 TABLET BY MOUTH EVERY DAY, Disp: 30 tablet, Rfl: 2 .  Magnesium 250 MG TABS, Take by mouth., Disp: , Rfl:  .  Melatonin 1 MG/4ML LIQD, Take by mouth at bedtime., Disp: , Rfl:  .  Multiple Vitamin (MULTI-VITAMINS) TABS, Take by mouth., Disp: , Rfl:  .  sertraline (ZOLOFT) 50 MG tablet, TAKE 1 TABLET BY MOUTH EVERY DAY, Disp: 90 tablet, Rfl: 0 .  tadalafil (CIALIS) 10 MG tablet, Take 1 tablet (10 mg total) by mouth daily as needed for erectile dysfunction., Disp: 10 tablet, Rfl: 0 .  Turmeric 500 MG CAPS, Take by mouth., Disp: , Rfl:  .  Polyethylene Glycol 3350 (MIRALAX PO), Take 17 g by mouth daily., Disp: , Rfl:

## 2017-02-17 NOTE — Patient Instructions (Signed)
We will arrange for a CPAP titration study We will see you back with one of our sleep physicians in 3-4 months

## 2017-02-17 NOTE — Assessment & Plan Note (Signed)
He reports some increase in sleepiness with napping frequently during the daytime. His weight has changed significantly since the last sleep study which was 15 years ago.  So he needs to have another CPAP titration study to make sure that his pressure is set appropriately and he could benefit from a more modern machine which would give [place] compliance information.  Plan: Encouraged not to drive while feeling sleepy CPAP titration study Follow-up with sleep physician next visit

## 2017-02-21 ENCOUNTER — Encounter: Payer: PPO | Admitting: Physical Therapy

## 2017-02-28 ENCOUNTER — Ambulatory Visit: Payer: PPO | Admitting: Physical Therapy

## 2017-02-28 DIAGNOSIS — M6281 Muscle weakness (generalized): Secondary | ICD-10-CM | POA: Diagnosis not present

## 2017-02-28 DIAGNOSIS — R278 Other lack of coordination: Secondary | ICD-10-CM

## 2017-02-28 NOTE — Patient Instructions (Signed)

## 2017-02-28 NOTE — Therapy (Signed)
[facility] [address], [place], [place], [address] Phone: [phone]   Fax:  [phone]  Physical Therapy Treatment  Patient Details  Name: John Blackburn MRN: [id number] Date of Birth: May 14, 1946 Referring Provider: Dr. Milus Banister  Encounter Date: 02/28/2017      PT End of Session - 02/28/17 1206    Visit Number 2   Number of Visits 10   Date for PT Re-Evaluation 04/11/17   Authorization Type medicare 10th visit g-code; Kx on 15th   PT Start Time 1148   PT Stop Time 1230   PT Time Calculation (min) 42 min   Activity Tolerance Patient tolerated treatment well   Behavior During Therapy [facility] for tasks assessed/performed      Past Medical History:  Diagnosis Date  . Allergy   . 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 Clotting disorder [facility])    November 2014, no longer on blood thinngers  . Depression    on multiple meds. Has been seen at [facility]. and Dr. Sabra Heck is his prescriber  . Diverticulitis of colon 2000 's   treated as an outpatient  . GERD (gastroesophageal reflux disease)    UGI done August '12 - ulcer/. Dr. Ferdinand Lango, gastroenterologist in [facility].   [name] Hx of adenomatous colonic polyps   . Hyperlipidemia    last lipid panel: HDL 44, LDL 117  . Hypertension   . Peptic ulcer    in the past and just recently-August '12  . Pulmonary emboli [facility])    noted October 2014 - treated by Dr. Linda Hedges  . skin cancer    skin CA  . Sleep apnea, primary central    wears CPAP    Past Surgical History:  Procedure Laterality Date  . ANAL RECTAL MANOMETRY N/A 11/30/2016   Procedure: ANO RECTAL MANOMETRY;  Surgeon: Mauri Pole, MD;  [facility];  Service: Endoscopy;  Laterality: N/A;  . CHOLECYSTECTOMY  1990   laproscopic   . HERNIA REPAIR    . JOINT REPLACEMENT     knee  . PROSTATE SURGERY     needle biopsy's, TURP  . TKR bilateral  2006   Dr. Violet Baldy   . UPPER GASTROINTESTINAL ENDOSCOPY    . VASECTOMY      There were no vitals filed for this visit.      Subjective Assessment - 02/28/17 1150    Subjective Maybe have noticed some positive changes.  Feeling a little more in control    Patient Stated Goals control of bowels; be able socialize    Currently in Pain? No/denies                         [facility] Adult PT Treatment/Exercise - 02/28/17 0001      Self-Care   Self-Care Other Self-Care Comments   Other Self-Care Comments  educated on types of fiber     Neuro Re-ed    Neuro Re-ed Details  contract and hold 5 sec with 5 sec rest, holding in between hook lying and sitting, clam in sitting with red banc with pelvic floor squeeze with red band in sitting, ball squeeze in sitting     Pelvic Floor Biofeedback   Biofeedback Activity --  contract and hold                PT Education - 02/28/17 1234    Education provided Yes  Education Details types of fiber - educated on getting more soluble   Person(s) Educated Patient   Methods Explanation;Handout   Comprehension Verbalized understanding          PT Short Term Goals - 02/28/17 1205      PT SHORT TERM GOAL #1   Title independent with initial HEP for hip and abdominal strength   Time 4   Period Weeks   Status On-going     PT SHORT TERM GOAL #2   Title able to contract pelvic floor without contracting gluteals or hold his breath   Time 4   Period Weeks   Status On-going     PT SHORT TERM GOAL #3   Title resting tone for the pelvic floor improved to 1 uv due to increase strength   Time 4   Period Weeks   Status On-going     PT SHORT TERM GOAL #4   Title fecal incontinence decreased >/= 25% due to increase strength   Time 4   Period Weeks   Status On-going           PT Long Term Goals - 02/14/17 1608      PT LONG TERM GOAL #1   Title independent with HEP and understands how to progress herself   Time 8   Period Weeks    Status New     PT LONG TERM GOAL #2   Title fecal leakage decreased by 75% due to increased strength to 10 vu with 5 second contraction   Time 8   Period Weeks   Status New     PT LONG TERM GOAL #3   Title ability to contract pelvic floor quickly without lag due to improved coordination of the pelvic floor   Time 8   Period Weeks   Status New     PT LONG TERM GOAL #4   Title improved hip strength >/= 4/5 to improve control of stool leakage   Time 8   Period Weeks   Status New               Plan - 02/28/17 1230    Clinical Impression Statement Patient able to contract pelvic floor without holding breath needing cues to do more gentle contraction.  Pt used biofeedback for visual cues and verbal cues from PT for improving coordination between breathing and pelvic floor contraction.  Pt continues to need skilled PT for improved coordination and pelvic floor strength.     Rehab Potential Excellent   PT Treatment/Interventions Biofeedback;Therapeutic activities;Therapeutic exercise;Neuromuscular re-education;Patient/family education;Manual techniques   PT Next Visit Plan f/u on adding soluble fiber, pelvic floor strength in hooklying and sitting with breathing, hip flexor stretches, hip abduction and extension strength   Consulted and Agree with Plan of Care Patient      Patient will benefit from skilled therapeutic intervention in order to improve the following deficits and impairments:  Decreased strength, Decreased endurance, Decreased coordination  Visit Diagnosis: Muscle weakness (generalized)  Other lack of coordination     Problem List Patient Active Problem List   Diagnosis Date Noted  . Elevated blood sugar 01/17/2017  . Incontinence of feces   . Family history of early CAD 08/12/2016  . Family hx of colon cancer 08/12/2016  . Allergic rhinitis 10/15/2013  . Chronic venous insufficiency 07/20/2013  . Acute pulmonary embolism ([place]) 07/08/2013  . ED  (erectile dysfunction) 06/01/2011  . Laryngitis, chronic 06/01/2011  . Arthritis   . Hypertension   .  Hyperlipidemia   . Sleep apnea, primary central     Zannie Cove, PT 02/28/2017, 1:47 PM  [facility] [address] [place], [place], [address] Phone: [phone]   Fax:  [phone]  Name: John Blackburn MRN: [id number] Date of Birth: [birth year]

## 2017-03-07 ENCOUNTER — Encounter: Payer: Self-pay | Admitting: Physical Therapy

## 2017-03-07 ENCOUNTER — Encounter: Payer: PPO | Admitting: Physical Therapy

## 2017-03-07 ENCOUNTER — Ambulatory Visit: Payer: PPO | Attending: Gastroenterology | Admitting: Physical Therapy

## 2017-03-07 DIAGNOSIS — M6281 Muscle weakness (generalized): Secondary | ICD-10-CM | POA: Diagnosis not present

## 2017-03-07 DIAGNOSIS — R278 Other lack of coordination: Secondary | ICD-10-CM | POA: Insufficient documentation

## 2017-03-07 NOTE — Therapy (Signed)
[facility] [address], [place], [place], [address] Phone: [phone]   Fax:  [phone]  Physical Therapy Treatment  Patient Details  Name: John Blackburn MRN: [id number] Date of Birth: June 25, 1946 Referring Provider: Dr. Milus Banister  Encounter Date: 03/07/2017      PT End of Session - 03/07/17 1641    Visit Number 3   Number of Visits 10   Date for PT Re-Evaluation 04/11/17   Authorization Type medicare 10th visit g-code; Kx on 15th   PT Start Time 1600   PT Stop Time 1638   PT Time Calculation (min) 38 min   Activity Tolerance Patient tolerated treatment well   Behavior During Therapy [facility] for tasks assessed/performed      Past Medical History:  Diagnosis Date  . Allergy   . 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 Clotting disorder [facility])    November 2014, no longer on blood thinngers  . Depression    on multiple meds. Has been seen at [facility]. and Dr. Sabra Heck is his prescriber  . Diverticulitis of colon 2000 's   treated as an outpatient  . GERD (gastroesophageal reflux disease)    UGI done August '12 - ulcer/. Dr. Ferdinand Lango, gastroenterologist in [facility].   [name] Hx of adenomatous colonic polyps   . Hyperlipidemia    last lipid panel: HDL 44, LDL 117  . Hypertension   . Peptic ulcer    in the past and just recently-August '12  . Pulmonary emboli [facility])    noted October 2014 - treated by Dr. Linda Hedges  . skin cancer    skin CA  . Sleep apnea, primary central    wears CPAP    Past Surgical History:  Procedure Laterality Date  . ANAL RECTAL MANOMETRY N/A 11/30/2016   Procedure: ANO RECTAL MANOMETRY;  Surgeon: Mauri Pole, MD;  Location: [facility];  Service: Endoscopy;  Laterality: N/A;  . CHOLECYSTECTOMY  1990   laproscopic   . HERNIA REPAIR    . JOINT REPLACEMENT     knee  . PROSTATE SURGERY     needle biopsy's, TURP  . TKR bilateral  2006   Dr. Violet Baldy   . UPPER GASTROINTESTINAL ENDOSCOPY    . VASECTOMY      There were no vitals filed for this visit.      Subjective Assessment - 03/07/17 1608    Subjective I am getting more control with my bowels.  I struggle with holding my breath with exercises. I had only 2 accidents this week.  I am 30% better.  I make myself sit on the commode several times per day. leak stool in sitting and when getting out of a chair.    Patient Stated Goals control of bowels; be able socialize    Currently in Pain? No/denies                         [facility] Adult PT Treatment/Exercise - 03/07/17 0001      Self-Care   Self-Care Other Self-Care Comments   Other Self-Care Comments  reviewed eating fiber and discussed cutting it into 1/3 due to having too much fiber     Therapeutic Activites    Therapeutic Activities ADL's   ADL's sit and bend forward with pelvic floor contraction to start reducing fecal incontinence in sitting     Neuro Re-ed    Neuro Re-ed Details  sitting with pelvic floor contraction and breathing on a tissue to prevent him from holding his breath.     Lumbar Exercises: Seated   Other Seated Lumbar Exercises hip rotation against the green band with pelvic floor contraction     Lumbar Exercises: Supine   Ab Set 10 reps   AB Set Limitations tactile cues for abdominal contraction and contract pelvic  hold 5 sec                PT Education - 03/07/17 1640    Education provided Yes   Education Details pelvic floor contraction with abdominal bracing, sit and lean forward, and hip rotation against the green band   Person(s) Educated Patient   Methods Explanation;Demonstration;Verbal cues;Handout   Comprehension Returned demonstration;Verbalized understanding          PT Short Term Goals - 03/07/17 1615      PT SHORT TERM GOAL #1   Title independent with initial HEP for hip and abdominal strength   Time 4   Period Weeks   Status On-going     PT SHORT  TERM GOAL #2   Title able to contract pelvic floor without contracting gluteals or hold his breath   Time 4   Period Weeks   Status Achieved     PT SHORT TERM GOAL #3   Title resting tone for the pelvic floor improved to 1 uv due to increase strength   Time 4   Period Weeks   Status On-going     PT SHORT TERM GOAL #4   Title fecal incontinence decreased >/= 25% due to increase strength   Time 4   Period Weeks   Status Achieved           PT Long Term Goals - 02/14/17 1608      PT LONG TERM GOAL #1   Title independent with HEP and understands how to progress herself   Time 8   Period Weeks   Status New     PT LONG TERM GOAL #2   Title fecal leakage decreased by 75% due to increased strength to 10 vu with 5 second contraction   Time 8   Period Weeks   Status New     PT LONG TERM GOAL #3   Title ability to contract pelvic floor quickly without lag due to improved coordination of the pelvic floor   Time 8   Period Weeks   Status New     PT LONG TERM GOAL #4   Title improved hip strength >/= 4/5 to improve control of stool leakage   Time 8   Period Weeks   Status New               Plan - 03/07/17 1642    Clinical Impression Statement Patient fecal incontinence si 30% better.  Patient is able to breath better with pelvic floor contraction.  Patient understands how to reduce his fiber to improve consistency of his bowels.  Patient reports he will have fecal incontinence with sitting and going from sit to stand. Patient will benefit from skilled therapy to improve coordination and pelvic floor strength.    Rehab Potential Excellent   Clinical Impairments Affecting Rehab Potential None   PT Frequency 1x / week   PT Duration 8 weeks   PT Treatment/Interventions Biofeedback;Therapeutic activities;Therapeutic exercise;Neuromuscular re-education;Patient/family education;Manual techniques   PT Next Visit Plan work with pelvic floor EMG with phase 2 of sit to stand,  hip strength with  pelvic floor contraction, supine hip IR/ER    PT Home Exercise Plan progress as needed   Recommended Other Services initial cert signed on 5/42/7062   Consulted and Agree with Plan of Care Patient      Patient will benefit from skilled therapeutic intervention in order to improve the following deficits and impairments:  Decreased strength, Decreased endurance, Decreased coordination  Visit Diagnosis: Muscle weakness (generalized)  Other lack of coordination     Problem List Patient Active Problem List   Diagnosis Date Noted  . Elevated blood sugar 01/17/2017  . Incontinence of feces   . Family history of early CAD 08/12/2016  . Family hx of colon cancer 08/12/2016  . Allergic rhinitis 10/15/2013  . Chronic venous insufficiency 07/20/2013  . Acute pulmonary embolism ([place]) 07/08/2013  . ED (erectile dysfunction) 06/01/2011  . Laryngitis, chronic 06/01/2011  . Arthritis   . Hypertension   . Hyperlipidemia   . Sleep apnea, primary central     Earlie Counts, PT 03/07/17 4:46 PM   [facility] [address], [place], [place], [address] Phone: [phone]   Fax:  [phone]  Name: John Blackburn MRN: [id number] Date of Birth: 1946-08-13

## 2017-03-07 NOTE — Patient Instructions (Addendum)
Isometric Hold (Hook-Lying)    Lie with hips and knees bent. Slowly inhale, and then exhale. Pull navel toward spine and Hold for _5__ seconds. Continue to breathe in and out during hold. Rest for 5___ seconds. Repeat _10__ times. Do _2__ times a day.   Copyright  VHI. All rights reserved.      Sit to Stand: Phase 1    Sitting, squeeze pelvic floor and hold. Breath out, Lean trunk forward. Repeat _10__ times. Do __1_ times a day. Then practice every time you are getting out of the chair.  Copyright  VHI. All rights reserved.  External Rotation: Hip - Knees Apart With Pelvic Floor (Sitting)    Sit, band tied just above knees. Squeeze pelvic floor while pulling knees apart. Hold for _1__ seconds. Rest for _15__ seconds. Repeat _1__ times. Do _1__ times a day.  Copyright  VHI. All rights reserved.  [facility] [address], [place], [place] [address] Phone # [phone] Fax [phone]

## 2017-03-14 ENCOUNTER — Encounter: Payer: PPO | Admitting: Physical Therapy

## 2017-03-16 LAB — GLUCOSE, POCT (MANUAL RESULT ENTRY): POC Glucose: 99 mg/dl (ref 70–99)

## 2017-03-21 ENCOUNTER — Encounter: Payer: Self-pay | Admitting: Nurse Practitioner

## 2017-03-21 ENCOUNTER — Other Ambulatory Visit (INDEPENDENT_AMBULATORY_CARE_PROVIDER_SITE_OTHER): Payer: PPO

## 2017-03-21 ENCOUNTER — Ambulatory Visit (INDEPENDENT_AMBULATORY_CARE_PROVIDER_SITE_OTHER): Payer: PPO | Admitting: Nurse Practitioner

## 2017-03-21 VITALS — BP 106/68 | HR 76 | Temp 97.6°F | Ht 73.0 in | Wt 239.0 lb

## 2017-03-21 DIAGNOSIS — H6122 Impacted cerumen, left ear: Secondary | ICD-10-CM | POA: Diagnosis not present

## 2017-03-21 DIAGNOSIS — R42 Dizziness and giddiness: Secondary | ICD-10-CM

## 2017-03-21 DIAGNOSIS — I10 Essential (primary) hypertension: Secondary | ICD-10-CM

## 2017-03-21 LAB — BASIC METABOLIC PANEL
BUN: 14 mg/dL (ref 6–23)
CHLORIDE: 99 meq/L (ref 96–112)
CO2: 34 mEq/L — ABNORMAL HIGH (ref 19–32)
Calcium: 9.9 mg/dL (ref 8.4–10.5)
Creatinine, Ser: 0.97 mg/dL (ref 0.40–1.50)
GFR: 81.09 mL/min (ref >60.00)
Glucose, Bld: 93 mg/dL (ref 70–99)
POTASSIUM: 3.5 meq/L (ref 3.5–5.1)
Sodium: 139 mEq/L (ref 135–145)

## 2017-03-21 MED ORDER — MECLIZINE HCL 12.5 MG PO TABS: 12.5000 mg | ORAL_TABLET | Freq: Two times a day (BID) | ORAL | 0 refills | Status: DC | PRN

## 2017-03-21 NOTE — Patient Instructions (Addendum)
Go to basement for blood draw. If no improvement in dizziness in 2weeks, return to office.  Change position slowly. encourage adequate oral hydration.  Will consider head Ct if no improvement with meclizine.  Dizziness Dizziness is a common problem. It is a feeling of unsteadiness or light-headedness. You may feel like you are about to faint. Dizziness can lead to injury if you stumble or fall. Anyone can become dizzy, but dizziness is more common in older adults. This condition can be caused by a number of things, including medicines, dehydration, or illness. Follow these instructions at home: Taking these steps may help with your condition: Eating and drinking  Drink enough fluid to keep your urine clear or pale yellow. This helps to keep you from becoming dehydrated. Try to drink more clear fluids, such as water.  Do not drink alcohol.  Limit your caffeine intake if directed by your health care provider.  Limit your salt intake if directed by your health care provider. Activity  Avoid making quick movements. ? Rise slowly from chairs and steady yourself until you feel okay. ? In the morning, first sit up on the side of the bed. When you feel okay, stand slowly while you hold onto something until you know that your balance is fine.  Move your legs often if you need to stand in one place for a long time. Tighten and relax your muscles in your legs while you are standing.  Do not drive or operate heavy machinery if you feel dizzy.  Avoid bending down if you feel dizzy. Place items in your home so that they are easy for you to reach without leaning over. Lifestyle  Do not use any tobacco products, including cigarettes, chewing tobacco, or electronic cigarettes. If you need help quitting, ask your health care provider.  Try to reduce your stress level, such as with yoga or meditation. Talk with your health care provider if you need help. General instructions  Watch your dizziness  for any changes.  Take medicines only as directed by your health care provider. Talk with your health care provider if you think that your dizziness is caused by a medicine that you are taking.  Tell a friend or a family member that you are feeling dizzy. If he or she notices any changes in your behavior, have this person call your health care provider.  Keep all follow-up visits as directed by your health care provider. This is important. Contact a health care provider if:  Your dizziness does not go away.  Your dizziness or light-headedness gets worse.  You feel nauseous.  You have reduced hearing.  You have new symptoms.  You are unsteady on your feet or you feel like the room is spinning. Get help right away if:  You vomit or have diarrhea and are unable to eat or drink anything.  You have problems talking, walking, swallowing, or using your arms, hands, or legs.  You feel generally weak.  You are not thinking clearly or you have trouble forming sentences. It may take a friend or family member to notice this.  You have chest pain, abdominal pain, shortness of breath, or sweating.  Your vision changes.  You notice any bleeding.  You have a headache.  You have neck pain or a stiff neck.  You have a fever. This information is not intended to replace advice given to you by your health care provider. Make sure you discuss any questions you have with your health care provider.  Document Released: 02/15/2001 Document Revised: 01/28/2016 Document Reviewed: 08/18/2014 Elsevier Interactive Patient Education  2017 [employer].

## 2017-03-21 NOTE — Progress Notes (Signed)
Subjective:  Patient ID: John Blackburn, male    DOB: May 17, 1946  Age: 71 y.o. MRN: [id number]  CC: Dizziness (dizzy,felt like faint happened 2 times in 1 wk. )   Dizziness  This is a new problem. The current episode started in the past 7 days. The problem occurs intermittently. The problem has been waxing and waning. Associated symptoms include vertigo. Pertinent negatives include no abdominal pain, anorexia, arthralgias, change in bowel habit, chest pain, chills, congestion, coughing, diaphoresis, fatigue, fever, headaches, joint swelling, myalgias, nausea, neck pain, numbness, rash, sore throat, swollen glands, urinary symptoms, visual change, vomiting or weakness. The symptoms are aggravated by walking and twisting. He has tried rest for the symptoms. The treatment provided significant relief.    Dizziness: 2episodes in last 1week. Happened while at home while standing. No hx of head injury, no recent URI, no change in medication or new OTC medication, Denies any activities outdoors   Outpatient Medications Prior to Visit  Medication Sig Dispense Refill  . atenolol-chlorthalidone (TENORETIC) 50-25 MG tablet Take 1 tablet by mouth daily. 90 tablet 3  . buPROPion (WELLBUTRIN XL) 300 MG 24 hr tablet TAKE 1 TABLET BY MOUTH EVERY DAY 30 tablet 2  . Magnesium 250 MG TABS Take by mouth.    . Melatonin 1 MG/4ML LIQD Take by mouth at bedtime.    . Multiple Vitamin (MULTI-VITAMINS) TABS Take by mouth.    . Polyethylene Glycol 3350 (MIRALAX PO) Take 17 g by mouth daily.    . sertraline (ZOLOFT) 50 MG tablet TAKE 1 TABLET BY MOUTH EVERY DAY 90 tablet 0  . tadalafil (CIALIS) 10 MG tablet Take 1 tablet (10 mg total) by mouth daily as needed for erectile dysfunction. 10 tablet 0  . Turmeric 500 MG CAPS Take by mouth.     No facility-administered medications prior to visit.     ROS See HPI  Objective:  BP 106/68   Pulse 76   Temp 97.6 F (36.4 C)   Ht 6\' 1"  (1.854 m)   Wt 239 lb  (108.4 kg)   SpO2 98%   BMI 31.53 kg/m   BP Readings from Last 3 Encounters:  03/21/17 106/68  02/17/17 102/62  01/27/17 (!) 100/58    Wt Readings from Last 3 Encounters:  03/21/17 239 lb (108.4 kg)  02/17/17 238 lb 12.8 oz (108.3 kg)  01/27/17 242 lb (109.8 kg)    Physical Exam  Constitutional: He is oriented to person, place, and time.  HENT:  Right Ear: Tympanic membrane, external ear and ear canal normal. No mastoid tenderness. No middle ear effusion.  Left Ear: Tympanic membrane, external ear and ear canal normal. No mastoid tenderness.  No middle ear effusion.  Eyes: Pupils are equal, round, and reactive to light. Conjunctivae and EOM are normal. No scleral icterus.  Neck: Normal range of motion. Neck supple. No JVD present.  No carotid bruit  Cardiovascular: Normal rate and regular rhythm.   Pulmonary/Chest: Effort normal and breath sounds normal.  Musculoskeletal: He exhibits no edema.  Lymphadenopathy:    He has no cervical adenopathy.  Neurological: He is alert and oriented to person, place, and time. Coordination normal.  Skin: Skin is warm and dry.  Vitals reviewed.   Lab Results  Component Value Date   WBC 7.4 04/01/2016   HGB 14.8 04/01/2016   HCT 44.0 04/01/2016   PLT 253.0 04/01/2016   GLUCOSE 93 03/21/2017   CHOL 187 04/01/2016   TRIG 118.0 04/01/2016  HDL 35.50 (L) 04/01/2016   LDLDIRECT 64.5 03/26/2013   LDLCALC 128 (H) 04/01/2016   ALT 13 08/11/2016   AST 16 08/11/2016   NA 139 03/21/2017   K 3.5 03/21/2017   CL 99 03/21/2017   CREATININE 0.97 03/21/2017   BUN 14 03/21/2017   CO2 34 (H) 03/21/2017   TSH 0.74 04/08/2015   INR 0.9 12/04/2013   HGBA1C 5.9 01/17/2017   Procedure Note :     Procedure :  Ear irrigation (left)   Indication:  Cerumen impaction   Risks, including pain, dizziness, eardrum perforation, bleeding, infection and others as well as benefits were explained to the patient in detail. Verbal consent was obtained and  the patient agreed to proceed.    We used "The Elephant Ear Irrigation Device" filled with lukewarm water for irrigation. A large amount wax was recovered. Procedure has also required manual wax removal with an ear loop.   Tolerated well. Complications: None.   Postprocedure instructions :  Call if problems.   Assessment & Plan:   John Blackburn was seen today for dizziness.  Diagnoses and all orders for this visit:  Dizziness -     Basic metabolic panel; Future -     meclizine (ANTIVERT) 12.5 MG tablet; Take 1 tablet (12.5 mg total) by mouth 2 (two) times daily as needed for dizziness (do not use for more than 3days).  Essential hypertension -     Basic metabolic panel; Future  Impacted cerumen of left ear   I am having John Blackburn start on meclizine. I am also having him maintain his Melatonin, Magnesium, Turmeric, MULTI-VITAMINS, tadalafil, atenolol-chlorthalidone, Polyethylene Glycol 3350 (MIRALAX PO), buPROPion, and sertraline.  Meds ordered this encounter  Medications  . meclizine (ANTIVERT) 12.5 MG tablet    Sig: Take 1 tablet (12.5 mg total) by mouth 2 (two) times daily as needed for dizziness (do not use for more than 3days).    Dispense:  6 tablet    Refill:  0    Order Specific Question:   Supervising Provider    Answer:   Cassandria Anger [[id number]]    Follow-up: Return if symptoms worsen or fail to improve.  Wilfred Lacy, NP

## 2017-03-22 ENCOUNTER — Ambulatory Visit: Payer: PPO | Admitting: Physical Therapy

## 2017-03-22 ENCOUNTER — Encounter: Payer: Self-pay | Admitting: Physical Therapy

## 2017-03-22 DIAGNOSIS — R278 Other lack of coordination: Secondary | ICD-10-CM

## 2017-03-22 DIAGNOSIS — M6281 Muscle weakness (generalized): Secondary | ICD-10-CM

## 2017-03-22 NOTE — Therapy (Addendum)
[facility] [address] [place], [place], [address] Phone: [phone]   Fax:  [phone]  Physical Therapy Treatment  Patient Details  Name: John Blackburn MRN: [id number] Date of Birth: 1945-09-06 Referring Provider: Dr. Milus Banister  Encounter Date: 03/22/2017      PT End of Session - 03/22/17 1446    Visit Number 4   Number of Visits 10   Date for PT Re-Evaluation 04/11/17   Authorization Type medicare 10th visit g-code; Kx on 15th   PT Start Time 1446   PT Stop Time 1525   PT Time Calculation (min) 39 min   Activity Tolerance Patient tolerated treatment well   Behavior During Therapy [facility] for tasks assessed/performed      Past Medical History:  Diagnosis Date  . Allergy   . 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 Clotting disorder [facility])    November 2014, no longer on blood thinngers  . Depression    on multiple meds. Has been seen at [facility]. and Dr. Sabra Heck is his prescriber  . Diverticulitis of colon 2000 's   treated as an outpatient  . GERD (gastroesophageal reflux disease)    UGI done August '12 - ulcer/. Dr. Ferdinand Lango, gastroenterologist in [facility].   [name] Hx of adenomatous colonic polyps   . Hyperlipidemia    last lipid panel: HDL 44, LDL 117  . Hypertension   . Peptic ulcer    in the past and just recently-August '12  . Pulmonary emboli [facility])    noted October 2014 - treated by Dr. Linda Hedges  . skin cancer    skin CA  . Sleep apnea, primary central    wears CPAP    Past Surgical History:  Procedure Laterality Date  . ANAL RECTAL MANOMETRY N/A 11/30/2016   Procedure: ANO RECTAL MANOMETRY;  Surgeon: Mauri Pole, MD;  Location: [facility];  Service: Endoscopy;  Laterality: N/A;  . CHOLECYSTECTOMY  1990   laproscopic   . HERNIA REPAIR    . JOINT REPLACEMENT     knee  . PROSTATE SURGERY     needle biopsy's, TURP  . TKR bilateral  2006   Dr. Violet Baldy   . UPPER GASTROINTESTINAL ENDOSCOPY    . VASECTOMY      There were no vitals filed for this visit.      Subjective Assessment - 03/22/17 1447    Subjective Fecal leakage is better.  2-3 days ago I had dome diarrhea and messed in my pants.  This is the first time I did not know I had an accident. I have 2 bowel movements per day and more formed.    Patient Stated Goals control of bowels; be able socialize    Currently in Pain? No/denies            [facility] PT Assessment - 03/22/17 0001      Strength   Right Hip Extension 5/5   Right Hip ABduction 5/5   Left Hip Extension 5/5   Left Hip ABduction 5/5      g-code: Functional assessment Tool is 30% limitation due to stool leakage; functional limitation is Other primary PT; goal status is CJ; Discharge status is CJ.  Earlie Counts, PT 05/31/17 3:37 PM                 [facility] Adult PT Treatment/Exercise - 03/22/17 0001      Self-Care   Self-Care Other  Self-Care Comments   Other Self-Care Comments  how to incorporate exercises into his day so he will not forget                PT Education - 03/22/17 1515    Education provided Yes   Education Details pelvic floor contraction with sitting, sit to stand   Person(s) Educated Patient   Methods Explanation;Demonstration;Handout   Comprehension Verbalized understanding;Returned demonstration          PT Short Term Goals - 03/22/17 1452      PT SHORT TERM GOAL #1   Title independent with initial HEP for hip and abdominal strength   Time 4   Period Weeks   Status Achieved           PT Long Term Goals - 03/22/17 1516      PT LONG TERM GOAL #2   Title fecal leakage decreased by 75% due to increased strength to 10 vu with 5 second contraction   Time 8   Period Weeks   Status On-going  51% better               Plan - 03/22/17 1524    Clinical Impression Statement Patient reports his fecal incontinence is 51% better.  He had on accident with  diarrhea when he was not aware of it.  Patient has trouble doing his exercises daily so the treatment focused on consolidating his exercises and making a set time to do everyday.  Patient has increased bilateral hip strength. Patient will benefit from skilled therapy to improve coordination and pelvic floor strength.    Rehab Potential Excellent   Clinical Impairments Affecting Rehab Potential None   PT Frequency 1x / week   PT Duration 8 weeks   PT Treatment/Interventions Biofeedback;Therapeutic activities;Therapeutic exercise;Neuromuscular re-education;Patient/family education;Manual techniques   PT Next Visit Plan see if patient is ready for discharge in 2 weeks   PT Home Exercise Plan progress as needed   Consulted and Agree with Plan of Care Patient      Patient will benefit from skilled therapeutic intervention in order to improve the following deficits and impairments:  Decreased strength, Decreased endurance, Decreased coordination  Visit Diagnosis: Muscle weakness (generalized)  Other lack of coordination     Problem List Patient Active Problem List   Diagnosis Date Noted  . Elevated blood sugar 01/17/2017  . Incontinence of feces   . Family history of early CAD 08/12/2016  . Family hx of colon cancer 08/12/2016  . Allergic rhinitis 10/15/2013  . Chronic venous insufficiency 07/20/2013  . Acute pulmonary embolism ([place]) 07/08/2013  . ED (erectile dysfunction) 06/01/2011  . Laryngitis, chronic 06/01/2011  . Arthritis   . Hypertension   . Hyperlipidemia   . Sleep apnea, primary central     Earlie Counts, PT 03/22/17 3:28 PM   [facility] [address] [place], [place], [address] Phone: [phone]   Fax:  [phone]  Name: John Blackburn MRN: [id number] Date of Birth: July 22, 1946  PHYSICAL THERAPY DISCHARGE SUMMARY  Visits from Start of Care: 4  Current functional level related to goals /  functional outcomes: See above.   Remaining deficits: See above. Unable to assess patient due to not returning to therapy after last visit on 03/22/2017.   Education / Equipment: HEP Plan:  Patient goals were not met. Patient is being discharged due to not returning since the last visit.  Thank you for the referral. Earlie Counts, PT 05/31/17 3:40 PM  ?????

## 2017-03-22 NOTE — Patient Instructions (Addendum)
Quick Contraction: Gravity Resisted (Sitting)    Sitting, quickly squeeze then fully relax pelvic floor. Perform __1_ sets of _5__. Rest for _1__ seconds between sets. Do _3__ times a day.  Copyright  VHI. All rights reserved.  Slow Contraction: Gravity Resisted (Sitting)    Sitting, slowly squeeze pelvic floor for _5__ seconds. Rest for _5__ seconds. Repeat _10__ times. Do _3__ times a day.  Copyright  VHI. All rights reserved.    Definitely do the exercises at 8:00 PM everyday.   Sit to Stand: Phase 3    Sitting, squeeze pelvic floor and hold. Lean trunk forward. Push up on arms and stand up. Relax. Do when you get out of a chair.  Copyright  VHI. All rights reserved.  [facility] [address], [place], [place] [address] Phone # [phone] Fax [phone]

## 2017-03-23 ENCOUNTER — Other Ambulatory Visit: Payer: Self-pay | Admitting: Internal Medicine

## 2017-03-23 DIAGNOSIS — F32A Depression, unspecified: Secondary | ICD-10-CM

## 2017-03-23 DIAGNOSIS — F329 Major depressive disorder, single episode, unspecified: Secondary | ICD-10-CM

## 2017-04-06 ENCOUNTER — Ambulatory Visit (HOSPITAL_BASED_OUTPATIENT_CLINIC_OR_DEPARTMENT_OTHER): Payer: PPO | Attending: Pulmonary Disease | Admitting: Internal Medicine

## 2017-04-06 DIAGNOSIS — G4761 Periodic limb movement disorder: Secondary | ICD-10-CM | POA: Insufficient documentation

## 2017-04-06 DIAGNOSIS — G4733 Obstructive sleep apnea (adult) (pediatric): Secondary | ICD-10-CM | POA: Insufficient documentation

## 2017-04-11 ENCOUNTER — Encounter: Payer: PPO | Admitting: Physical Therapy

## 2017-04-11 DIAGNOSIS — G4733 Obstructive sleep apnea (adult) (pediatric): Secondary | ICD-10-CM | POA: Diagnosis not present

## 2017-04-11 NOTE — Procedures (Signed)
   Patient Name: John, Blackburn Date: 04/06/2017 Gender: Male D.O.B: April 11, 1946 Age (years): 67 Referring Provider: Simonne Maffucci Height (inches): 75 Interpreting Physician: Baird Lyons MD, ABSM Weight (lbs): 230 RPSGT: Laren Everts BMI: 29 MRN: [id number] Neck Size: 16.50 CLINICAL INFORMATION The patient is referred for a CPAP titration to treat sleep apnea.  Date of NPSG, Split Night or HST:  Diagnostic NPSG 07/26/98  AHI 30/ hr, desaturation to 83%, body weight 310 lbs  SLEEP STUDY TECHNIQUE As per the AASM Manual for the Scoring of Sleep and Associated Events v2.3 (April 2016) with a hypopnea requiring 4% desaturations.  The channels recorded and monitored were frontal, central and occipital EEG, electrooculogram (EOG), submentalis EMG (chin), nasal and oral airflow, thoracic and abdominal wall motion, anterior tibialis EMG, snore microphone, electrocardiogram, and pulse oximetry. Continuous positive airway pressure (CPAP) was initiated at the beginning of the study and titrated to treat sleep-disordered breathing.  MEDICATIONS Medications self-administered by patient taken the night of the study : [place] Comments added by technician: Patient had difficulty initiating sleep.  Comments added by scorer: N/A  RESPIRATORY PARAMETERS Optimal PAP Pressure (cm): 8 AHI at Optimal Pressure (/hr): 0.0 Overall Minimal O2 (%): 93.00 Supine % at Optimal Pressure (%): 100 Minimal O2 at Optimal Pressure (%): 95.0    SLEEP ARCHITECTURE The study was initiated at 10:56:32 PM and ended at 5:30:25 AM.  Sleep onset time was 16.8 minutes and the sleep efficiency was 73.8%. The total sleep time was 290.5 minutes.  The patient spent 17.73% of the night in stage N1 sleep, 72.46% in stage N2 sleep, 0.00% in stage N3 and 9.81% in REM.Stage REM latency was 288.5 minutes  Wake after sleep onset was 86.6. Alpha intrusion was absent. Supine sleep was  56.63%.  CARDIAC DATA The 2 lead EKG demonstrated sinus rhythm. The mean heart rate was 48.70 beats per minute. Other EKG findings include: None.  LEG MOVEMENT DATA The total Periodic Limb Movements of Sleep (PLMS) were 403. The PLMS index was 83.24. A PLMS index of <15 is considered normal in adults.  IMPRESSIONS - The optimal PAP pressure was 8 cm of water. - Central sleep apnea was not noted during this titration (CAI = 0.0/h). - Significant oxygen desaturations were not observed during this titration (min O2 = 93.00%). - No snoring was audible during this study. - No cardiac abnormalities were observed during this study. - Severe periodic limb movements were observed during this study. Arousals associated with PLMs were significant.  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 - Sleep hygiene should be reviewed to assess factors that may improve sleep quality. - Weight management and regular exercise should be initiated or continued.  [Electronically signed] 04/11/2017 02:56 PM  Baird Lyons MD, [place], American Board of Sleep Medicine   NPI: [id number]  [place], American Board of Sleep Medicine  ELECTRONICALLY SIGNED ON:  04/11/2017, 2:51 PM [place] PH: [phone]   FX: [phone] [place]

## 2017-04-13 ENCOUNTER — Telehealth: Payer: Self-pay | Admitting: Internal Medicine

## 2017-04-13 NOTE — Telephone Encounter (Signed)
Spoke with pt about his sleep study results per BQ.Pt wanted to call his new insurance company to find out what home care company is in his network. He states he uses to use [facility] for 3-4 years but unsure if he can stay with them. Will await his call back so the order to change his settings    Notes recorded by Juanito Doom, MD on 04/11/2017 at 4:02 PM EDT A, Please make sure he knows that he needs CPAP set at 8cm H20. If he doesn't have a machine, please order. Thanks, B

## 2017-04-18 NOTE — Telephone Encounter (Signed)
Waiting on a call back from the pt to find out if his insurance will cover [facility].

## 2017-04-19 NOTE — Telephone Encounter (Signed)
lmomtcb x 1 to see if the pt has spoken with his insurance to see if [facility] is covered.

## 2017-04-19 NOTE — Telephone Encounter (Signed)
Patient returned call.  He is going to call his insurance at this time to see if [facility] is covered and will call us back once he finds this out.

## 2017-04-19 NOTE — Telephone Encounter (Signed)
Pt states that [facility] is covered under his insurance.  Pt is going to call his insurance back to see if a new machine is covered.  Pt requests that we hold off on sending orders until he calls back

## 2017-04-21 NOTE — Telephone Encounter (Signed)
Waiting on call back from pt to let us know if [facility] is covered under his insurance.

## 2017-04-24 NOTE — Telephone Encounter (Signed)
Waiting on a call back from pt to see if the insurance will cover a new machine.  Will sign off at this time.

## 2017-04-26 ENCOUNTER — Telehealth: Payer: Self-pay | Admitting: Pulmonary Disease

## 2017-04-26 NOTE — Telephone Encounter (Signed)
The other message was closed

## 2017-04-29 ENCOUNTER — Ambulatory Visit (INDEPENDENT_AMBULATORY_CARE_PROVIDER_SITE_OTHER): Payer: PPO

## 2017-04-29 ENCOUNTER — Encounter (HOSPITAL_COMMUNITY): Payer: Self-pay | Admitting: Family Medicine

## 2017-04-29 ENCOUNTER — Ambulatory Visit (HOSPITAL_COMMUNITY)
Admission: EM | Admit: 2017-04-29 | Discharge: 2017-04-29 | Disposition: A | Discharge status: Home / Self Care | Payer: PPO | Attending: Family Medicine | Admitting: Family Medicine

## 2017-04-29 DIAGNOSIS — S52134A Nondisplaced fracture of neck of right radius, initial encounter for closed fracture: Secondary | ICD-10-CM

## 2017-04-29 DIAGNOSIS — S6991XA Unspecified injury of right wrist, hand and finger(s), initial encounter: Secondary | ICD-10-CM

## 2017-04-29 DIAGNOSIS — S52131A Displaced fracture of neck of right radius, initial encounter for closed fracture: Secondary | ICD-10-CM | POA: Diagnosis not present

## 2017-04-29 DIAGNOSIS — M25531 Pain in right wrist: Secondary | ICD-10-CM | POA: Diagnosis not present

## 2017-04-29 DIAGNOSIS — S59901A Unspecified injury of right elbow, initial encounter: Secondary | ICD-10-CM

## 2017-04-29 DIAGNOSIS — M7989 Other specified soft tissue disorders: Secondary | ICD-10-CM | POA: Diagnosis not present

## 2017-04-29 IMAGING — DX DG ELBOW COMPLETE 3+V*R*
4 series · 4 of 4 positions shown · non-contrast
Comparison: None.

CLINICAL DATA: Recent fall with elbow pain, initial encounter

EXAM:
RIGHT ELBOW - COMPLETE 3+ VIEW

[elbow ap]
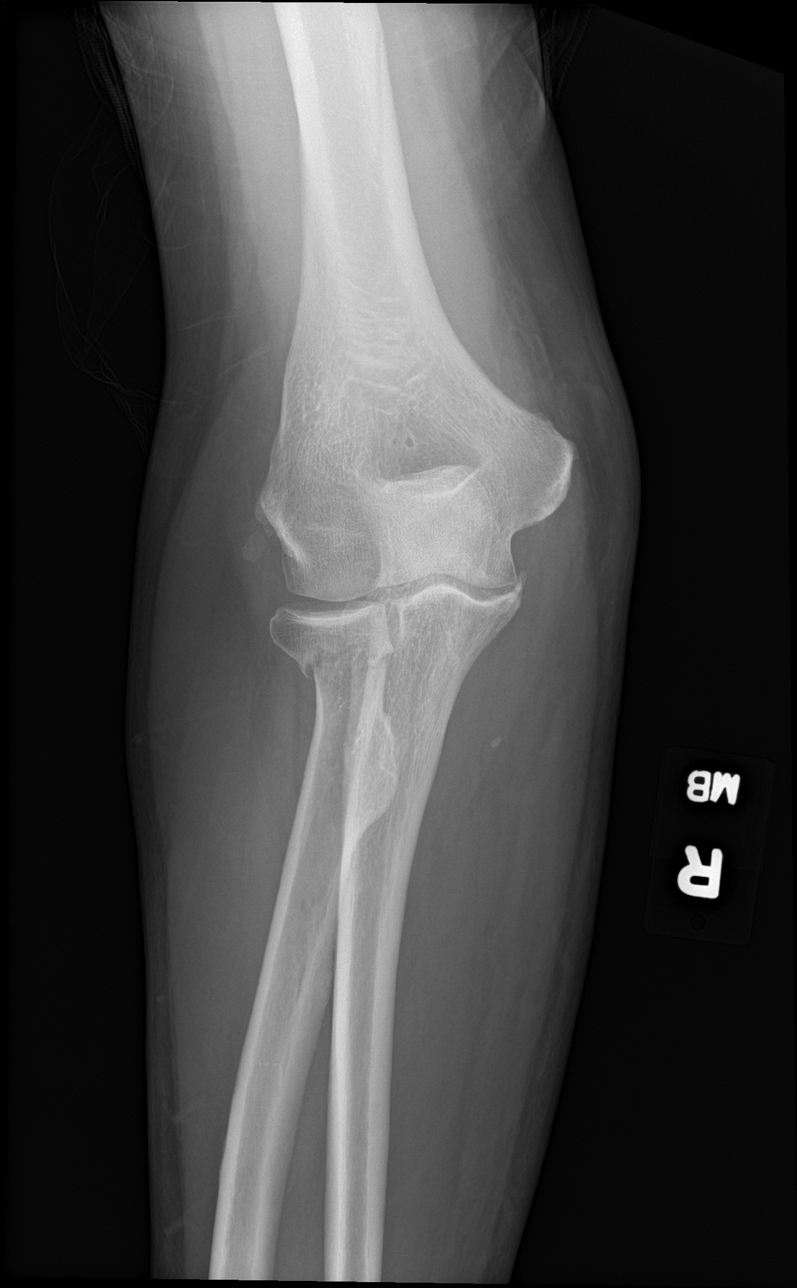

[elbow obl (1 of 2)]
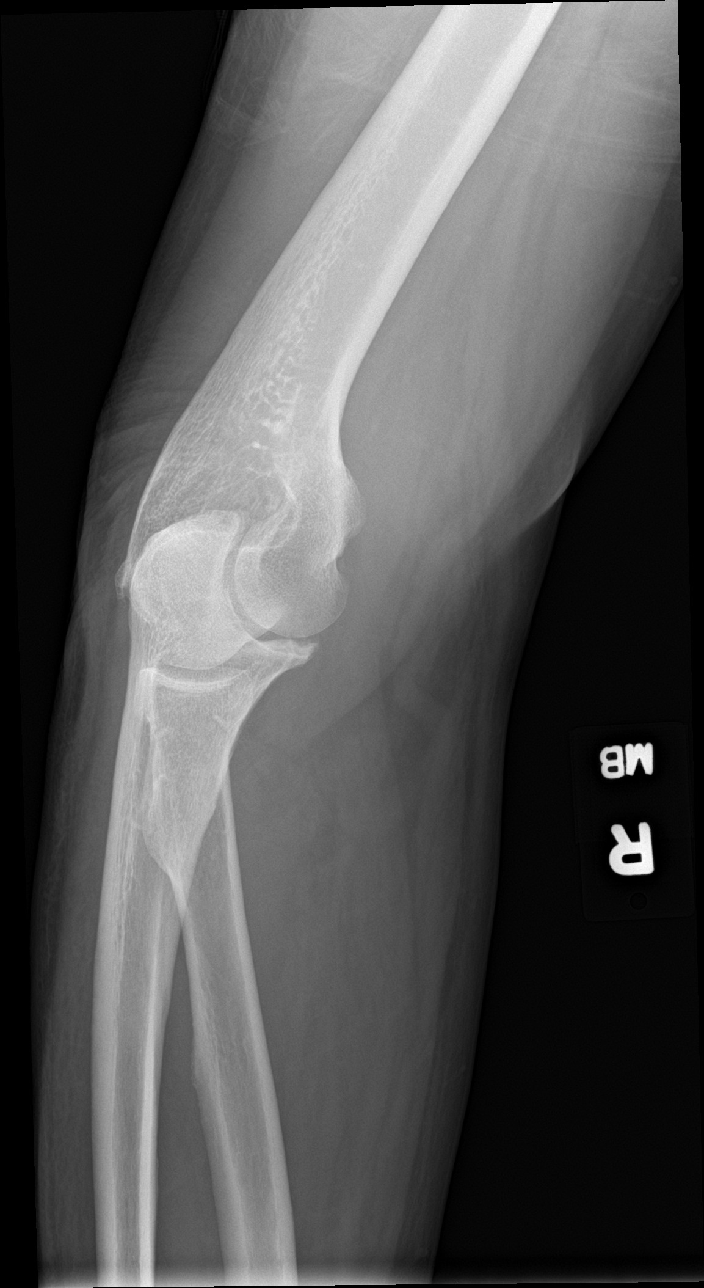

[elbow obl (2 of 2)]
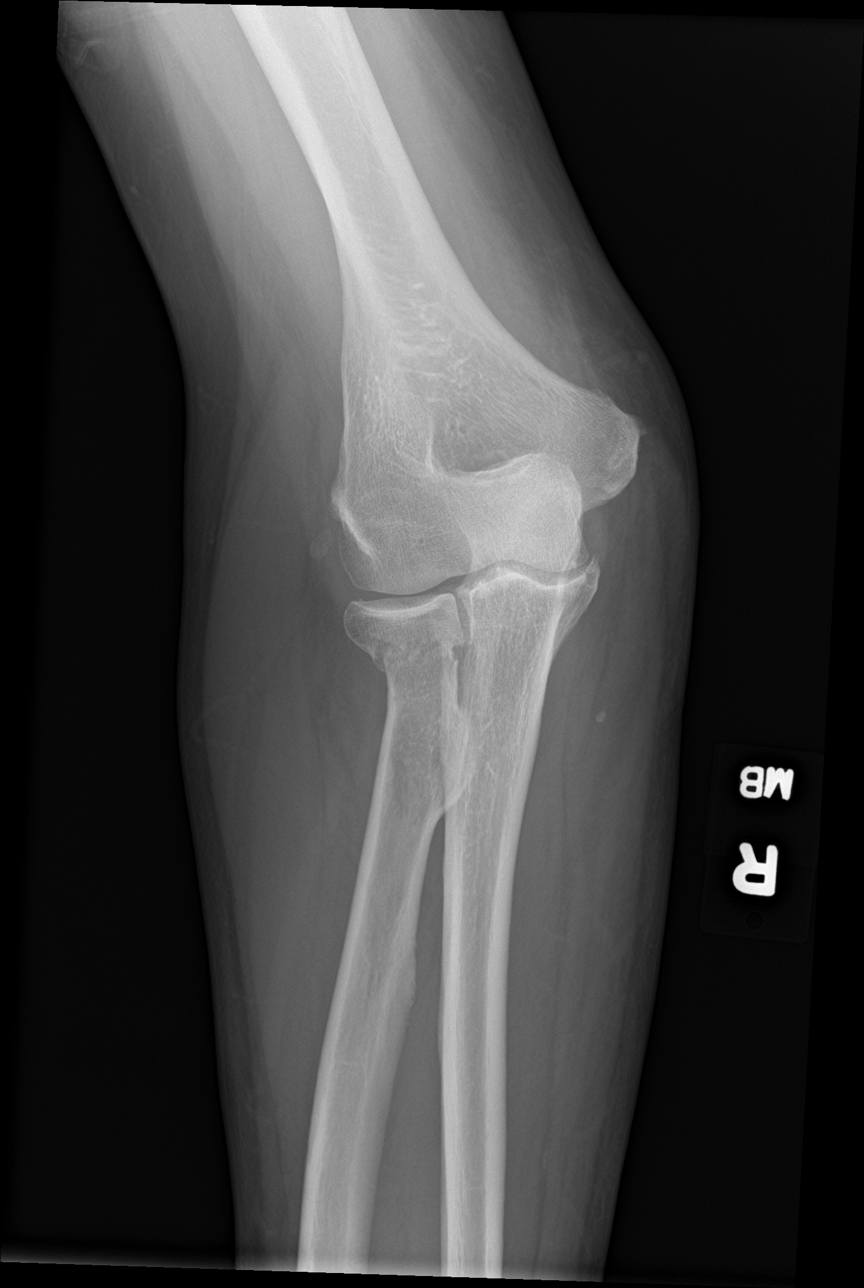

[elbow lat]
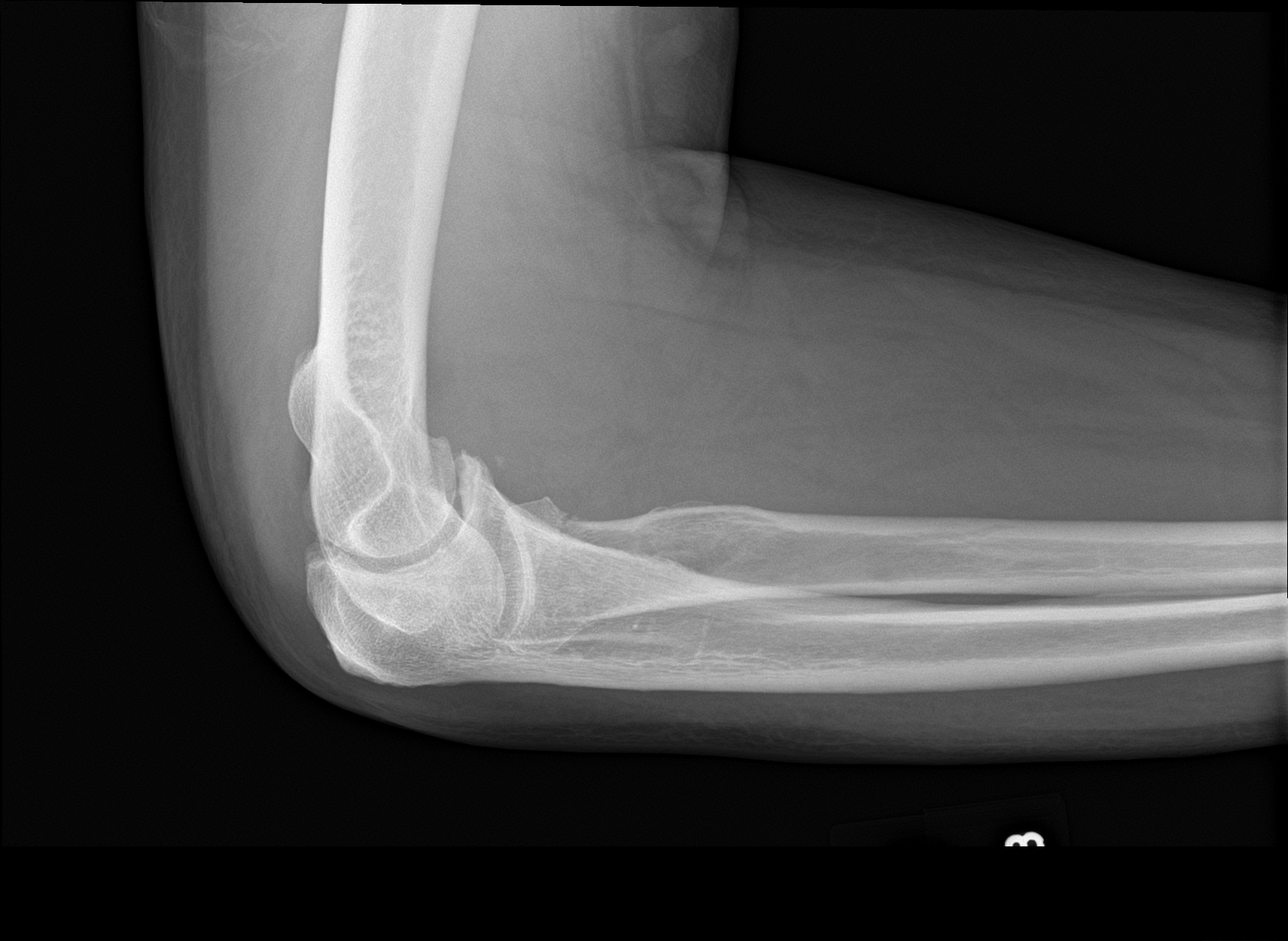

[4 of 4 positions shown; findings below may reference images not displayed]

FINDINGS: There is a transverse fracture with mild impaction through the
radial neck. There does not appear to be articular involvement.
Joint effusion is noted. No other focal abnormality is seen.
IMPRESSION: Radial neck fracture with mild impaction.

## 2017-04-29 IMAGING — DX DG WRIST COMPLETE 3+V*R*
3 series · 3 of 3 positions shown · non-contrast
Comparison: None.

CLINICAL DATA: Recent fall with right wrist and elbow pain, initial
encounter

EXAM:
RIGHT WRIST - COMPLETE 3+ VIEW

[wrist pa]
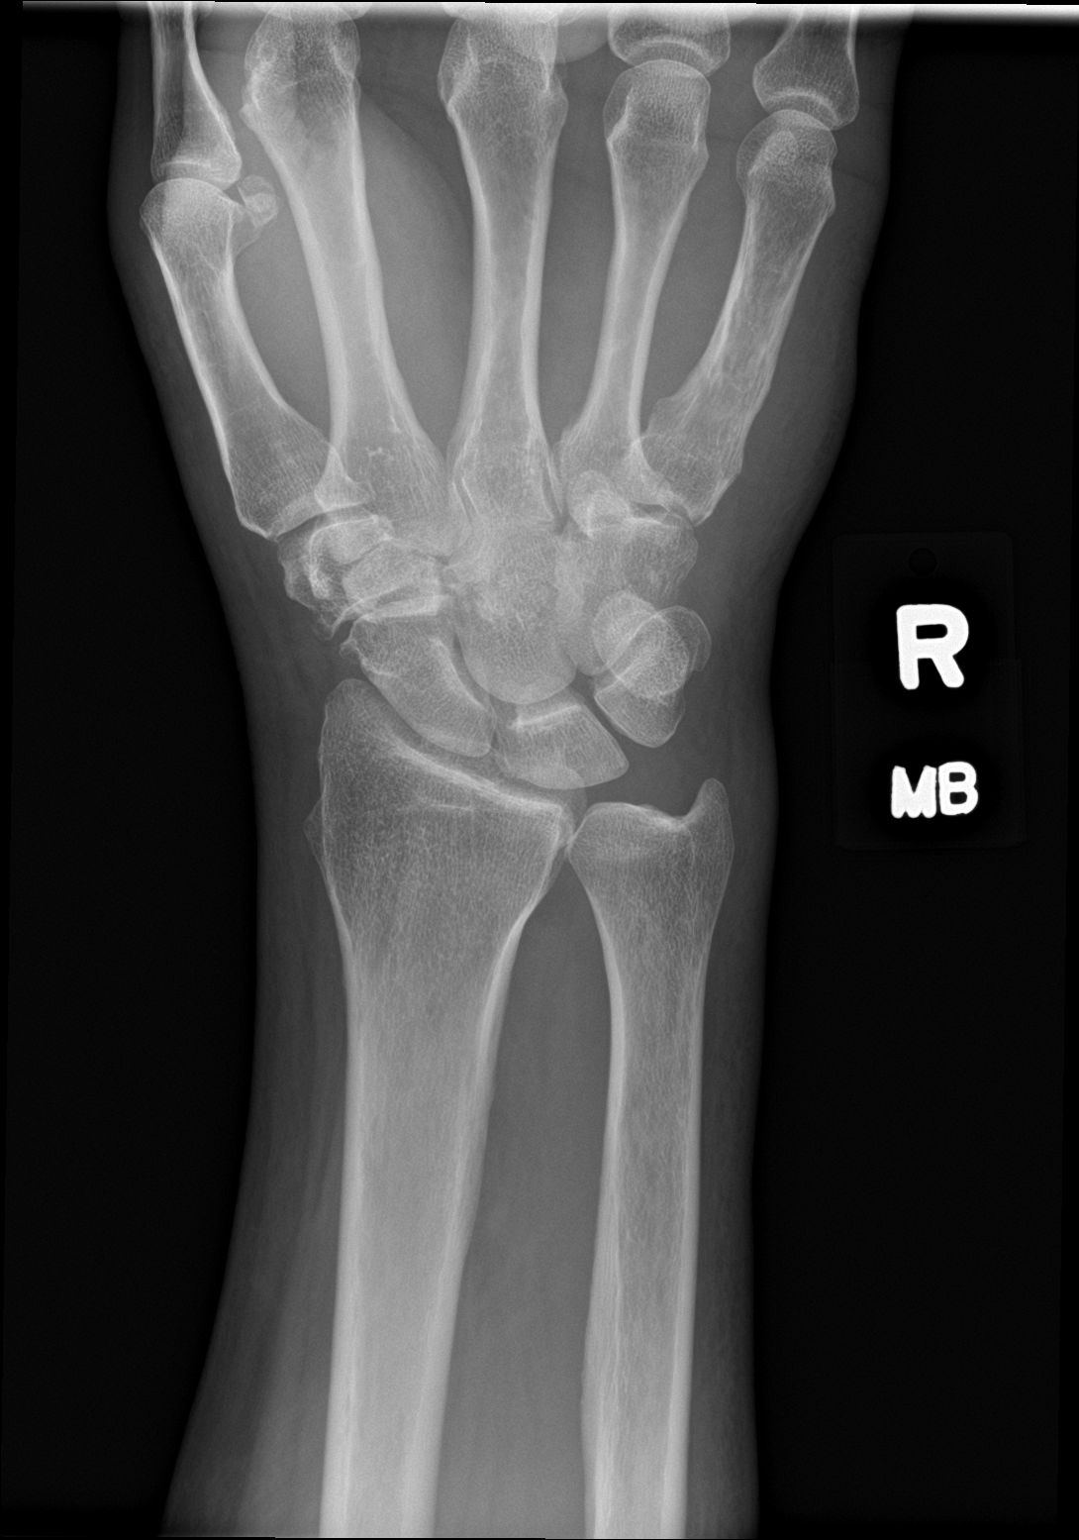

[wrist obl]
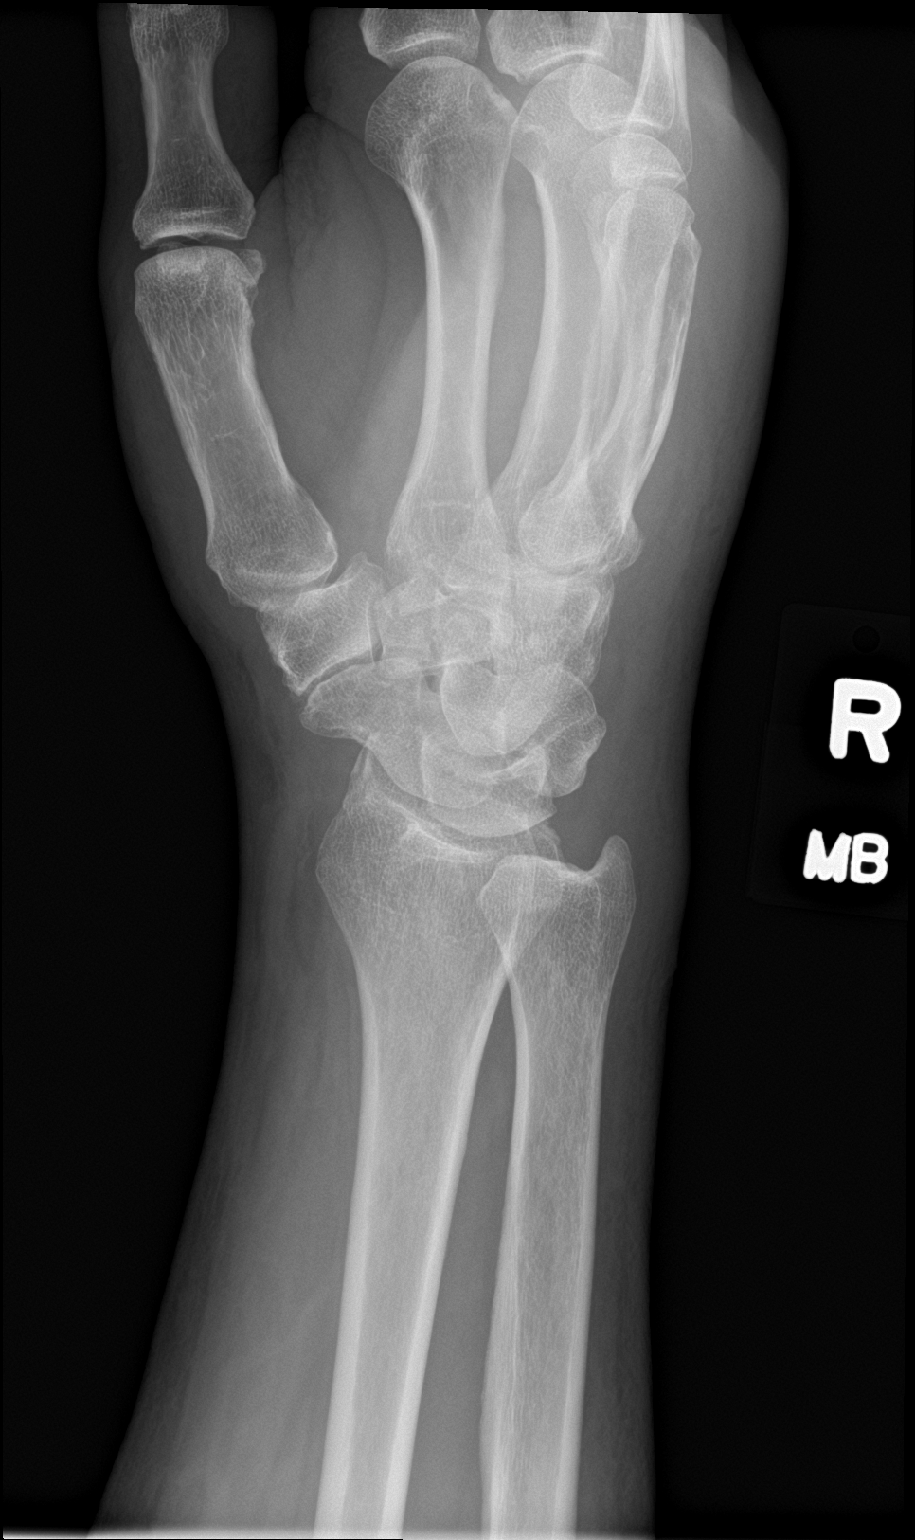

[wrist lat]
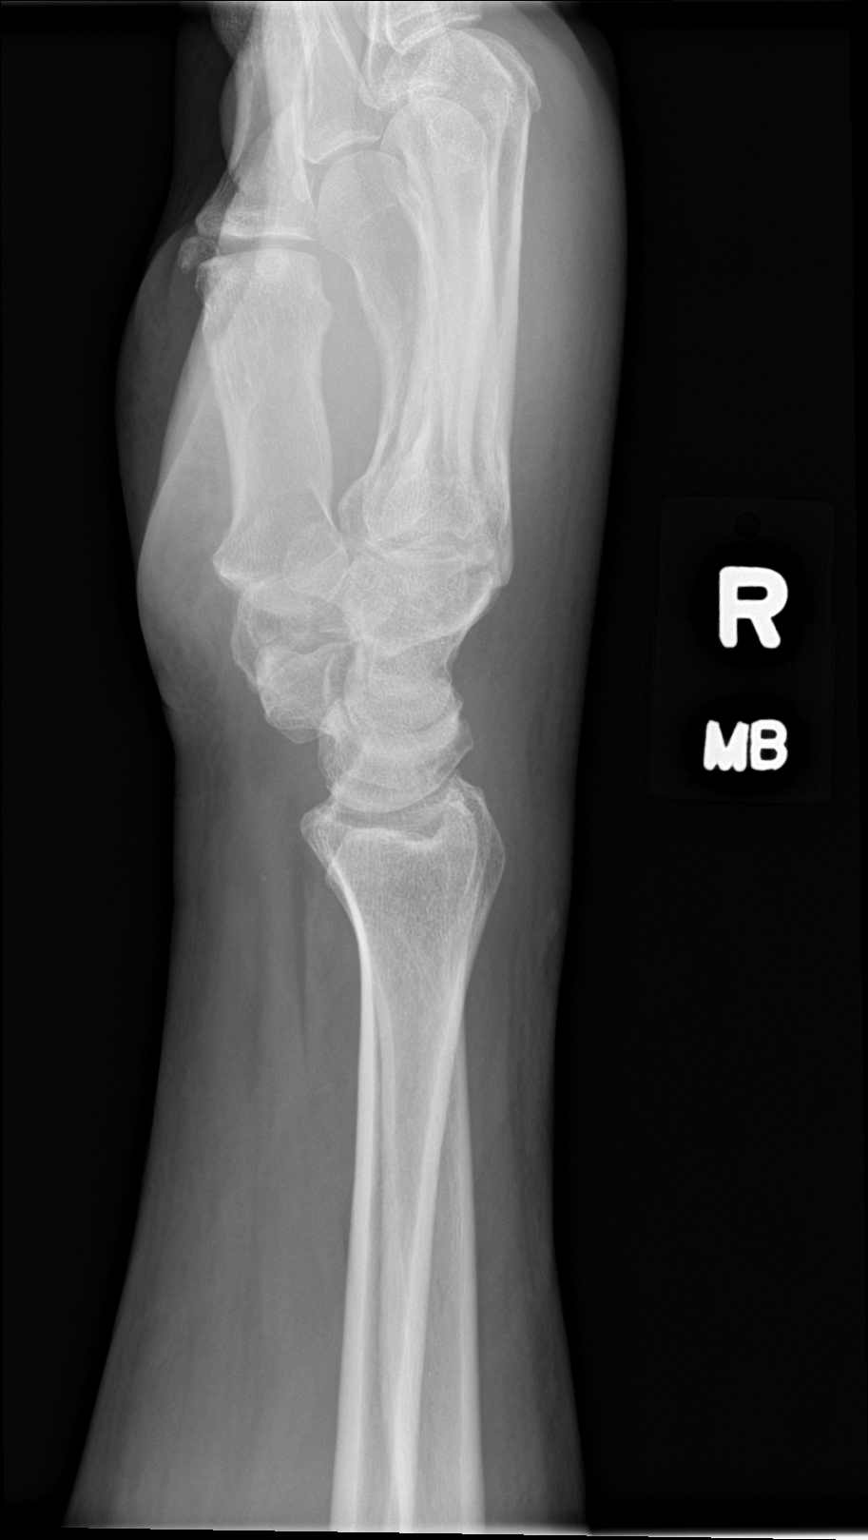

[3 of 3 positions shown; findings below may reference images not displayed]

FINDINGS: Mild carpal degenerative changes are noted. No acute fracture or
dislocation is seen. Mild soft tissue swelling is seen.
IMPRESSION: Soft tissue swelling without acute bony abnormality.

## 2017-04-29 NOTE — ED Triage Notes (Signed)
Pt here for fall last night onto his right side. Right wrist swelling and pain. sts also right knee pain. Abrasion to right knee.

## 2017-04-29 NOTE — ED Provider Notes (Signed)
[place]   [id number] 04/29/17 Arrival Time: 1206  ASSESSMENT & PLAN:  1. Closed nondisplaced fracture of neck of right radius, initial encounter   2. Wrist injury, right, initial encounter   3. Elbow injury, right, initial encounter    Long arm posterior splint applied by orthopaedic tech. Pt prefers OTC analgesics as needed. To schedule f/u with orthopaedist next week. Number provided.  Reviewed expectations re: course of current medical issues. Questions answered. Outlined signs and symptoms indicating need for more acute intervention. Patient verbalized understanding. After Visit Summary given.   SUBJECTIVE:  John Blackburn is a 71 y.o. male who presents with complaint of R elbow and wrist pain since falling yesterday on concrete. Fell onto R side, mainly R arm. Gradual onset of swelling and discomfort around wrist. Today more pain in elbow, especially with movements. No extremity weakness or sensation changes reported. OTC analgesics with temporary help with discomfort.  Also reports abrasion to his R knee. No pain. Describes FROM. Normal ambulation without difficulty.  Immunization History  Administered Date(s) Administered  . Influenza Whole 08/13/2012  . Influenza, High Dose Seasonal PF 06/01/2013  . Influenza,inj,Quad PF,6+ Mos 06/19/2014, 05/26/2015  . Influenza-Unspecified 07/21/2016  . Pneumococcal Conjugate-13 04/01/2016  . Pneumococcal Polysaccharide-23 05/15/2012  . Td 05/15/2012   ROS: As per HPI.  OBJECTIVE:  Vitals:   04/29/17 1242  BP: 126/81  Pulse: 64  Resp: 18  Temp: 98.4 F (36.9 C)  SpO2: 95%    General appearance: alert; no distress Neck: supple Lungs: clear to auscultation bilaterally Heart: regular rate and rhythm Abdomen: soft, non-tender Extremities: RUE with tenderness over lateral elbow; unable to fully extend; mild swelling; R wrist and hand swollen; generalized tenderness over wrist; distal sensation intact;  normal capillary refill RLE with abrasion over anterior knee; FROM and normal strength; no bony tenderness Skin: warm and dry Neurologic: normal gait; normal symmetric reflexes Psychological: alert and cooperative; normal mood and affect  Past Medical History:  Diagnosis Date  . Allergy   . 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 Clotting disorder [facility])    November 2014, no longer on blood thinngers  . Depression    on multiple meds. Has been seen at [facility]. and Dr. Sabra Heck is his prescriber  . Diverticulitis of colon 2000 's   treated as an outpatient  . GERD (gastroesophageal reflux disease)    UGI done August '12 - ulcer/. Dr. Ferdinand Lango, gastroenterologist in [facility].   [name] Hx of adenomatous colonic polyps   . Hyperlipidemia    last lipid panel: HDL 44, LDL 117  . Hypertension   . Peptic ulcer    in the past and just recently-August '12  . Pulmonary emboli [facility])    noted October 2014 - treated by Dr. Linda Hedges  . skin cancer    skin CA  . Sleep apnea, primary central    wears CPAP    Imaging: Dg Elbow Complete Right  Result Date: 04/29/2017 CLINICAL DATA:  Recent fall with elbow pain, initial encounter EXAM: RIGHT ELBOW - COMPLETE 3+ VIEW COMPARISON:  None. FINDINGS: There is a transverse fracture with mild impaction through the radial neck. There does not appear to be articular involvement. Joint effusion is noted. No other focal abnormality is seen. IMPRESSION: Radial neck fracture with mild impaction. Electronically Signed   By: Inez Catalina M.D.   On: 04/29/2017 14:06   Dg Wrist Complete Right  Result Date: 04/29/2017 CLINICAL  DATA:  Recent fall with right wrist and elbow pain, initial encounter EXAM: RIGHT WRIST - COMPLETE 3+ VIEW COMPARISON:  None. FINDINGS: Mild carpal degenerative changes are noted. No acute fracture or dislocation is seen. Mild soft tissue swelling is seen. IMPRESSION: Soft tissue swelling without acute bony abnormality.  Electronically Signed   By: Inez Catalina M.D.   On: 04/29/2017 14:05    Allergies  Allergen Reactions  . Cephalexin Rash  . Levofloxacin Rash    Family History  Problem Relation Age of Onset  . Stroke Mother   . Hypertension Mother   . Heart disease Mother   . Heart disease Father        CAD/MI-fatal sudden death  . Colon cancer Father   . Colon cancer Sister        colon cancer 20090-survivor  . Heart disease Paternal Uncle   . Colon cancer Paternal Uncle   . Heart disease Paternal Grandfather   . Colon cancer Paternal Aunt   . Colon cancer Cousin   . Colon cancer Cousin   . Colon cancer Paternal Uncle   . Stomach cancer Neg Hx   . Rectal cancer Neg Hx   . Esophageal cancer Neg Hx    Past Surgical History:  Procedure Laterality Date  . ANAL RECTAL MANOMETRY N/A 11/30/2016   Procedure: ANO RECTAL MANOMETRY;  Surgeon: Mauri Pole, MD;  Location: [facility];  Service: Endoscopy;  Laterality: N/A;  . CHOLECYSTECTOMY  1990   laproscopic   . HERNIA REPAIR    . JOINT REPLACEMENT     knee  . PROSTATE SURGERY     needle biopsy's, TURP  . TKR bilateral  2006   Dr. Violet Baldy  . UPPER GASTROINTESTINAL ENDOSCOPY    . Lady Deutscher, MD 04/29/17 [phone]

## 2017-05-05 DIAGNOSIS — S52134A Nondisplaced fracture of neck of right radius, initial encounter for closed fracture: Secondary | ICD-10-CM | POA: Diagnosis not present

## 2017-05-05 DIAGNOSIS — S52134D Nondisplaced fracture of neck of right radius, subsequent encounter for closed fracture with routine healing: Secondary | ICD-10-CM | POA: Diagnosis not present

## 2017-05-12 ENCOUNTER — Other Ambulatory Visit: Payer: Self-pay | Admitting: Internal Medicine

## 2017-05-26 DIAGNOSIS — S52134D Nondisplaced fracture of neck of right radius, subsequent encounter for closed fracture with routine healing: Secondary | ICD-10-CM | POA: Diagnosis not present

## 2017-05-30 DIAGNOSIS — S52134D Nondisplaced fracture of neck of right radius, subsequent encounter for closed fracture with routine healing: Secondary | ICD-10-CM | POA: Diagnosis not present

## 2017-06-01 ENCOUNTER — Ambulatory Visit (INDEPENDENT_AMBULATORY_CARE_PROVIDER_SITE_OTHER): Payer: PPO | Admitting: Internal Medicine

## 2017-06-01 ENCOUNTER — Encounter: Payer: Self-pay | Admitting: Internal Medicine

## 2017-06-01 ENCOUNTER — Institutional Professional Consult (permissible substitution): Payer: PPO | Admitting: Internal Medicine

## 2017-06-01 VITALS — BP 116/80 | HR 62 | Ht 73.0 in | Wt 237.0 lb

## 2017-06-01 DIAGNOSIS — G4733 Obstructive sleep apnea (adult) (pediatric): Secondary | ICD-10-CM

## 2017-06-01 DIAGNOSIS — J370 Chronic laryngitis: Secondary | ICD-10-CM

## 2017-06-01 DIAGNOSIS — S52134D Nondisplaced fracture of neck of right radius, subsequent encounter for closed fracture with routine healing: Secondary | ICD-10-CM | POA: Diagnosis not present

## 2017-06-01 NOTE — Progress Notes (Signed)
06/01/17- 71 year old male never smoker for sleep evaluation. Pt referredy by PCP Dr. Sharlet Salina MD. Sleep study completed, pt has CPAP currently DME- [facility]; changed recently DME to [facility]. Pt is requesting a new CPAP machine and supplies. Medical problem list includes pulmonary embolism 2014, allergic rhinitis, arthritis, BPH, peripheral venous insufficiency, GERD, HBP CPAP titration study 04/06/17-8 CWP, PLMA NPSG 07/26/08-AHI 30/hour, desaturation to 83%, body weight 310 pounds MSLT 12/01/03-mean latency 3.0   SOREM 0/4 Living alone. Has been using an old machine, more than 71 years old, with [facility]. Wants to change to [facility] and replace old machine. He describes good compliance and benefit from CPAP. Melatonin 5 mg at bedtime. No other sleep medicine. Little caffeine. Occasionally feels as if he sleeps all day, especially since he broke his right arm recently and has been wearing a cast. ENT surgery-tonsils.  Prior to Admission medications   Medication Sig Start Date End Date Taking? Authorizing Provider  atenolol-chlorthalidone (TENORETIC) 50-25 MG tablet TAKE 1 TABLET BY MOUTH EVERY DAY 05/15/17  Yes Hoyt Koch, MD  buPROPion (WELLBUTRIN XL) 300 MG 24 hr tablet TAKE 1 TABLET BY MOUTH ONCE A DAY  03/23/17  Yes Golden Circle, FNP  Magnesium 250 MG TABS Take by mouth.   Yes [provider]  Melatonin 1 MG/4ML LIQD Take by mouth at bedtime.   Yes [provider]  Multiple Vitamin (MULTI-VITAMINS) TABS Take by mouth.   Yes [provider]  sertraline (ZOLOFT) 50 MG tablet take 1 tablet by mouth every day 03/23/17  Yes Golden Circle, FNP  Turmeric 500 MG CAPS Take by mouth.   Yes [provider]   Past Medical History:  Diagnosis Date  . Allergy   . 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 Clotting disorder [facility])    November 2014, no longer on blood thinngers  . Depression    on multiple meds. Has been seen at [facility]. and  Dr. Sabra Heck is his prescriber  . Diverticulitis of colon 2000 's   treated as an outpatient  . GERD (gastroesophageal reflux disease)    UGI done August '12 - ulcer/. Dr. Ferdinand Lango, gastroenterologist in [facility].   [name] Hx of adenomatous colonic polyps   . Hyperlipidemia    last lipid panel: HDL 44, LDL 117  . Hypertension   . Peptic ulcer    in the past and just recently-August '12  . Pulmonary emboli [facility])    noted October 2014 - treated by Dr. Linda Hedges  . skin cancer    skin CA  . Sleep apnea, primary central    wears CPAP   Past Surgical History:  Procedure Laterality Date  . ANAL RECTAL MANOMETRY N/A 11/30/2016   Procedure: ANO RECTAL MANOMETRY;  Surgeon: Mauri Pole, MD;  Location: [facility];  Service: Endoscopy;  Laterality: N/A;  . CHOLECYSTECTOMY  1990   laproscopic   . HERNIA REPAIR    . JOINT REPLACEMENT     knee  . PROSTATE SURGERY     needle biopsy's, TURP  . TKR bilateral  2006   Dr. Violet Baldy  . UPPER GASTROINTESTINAL ENDOSCOPY    . VASECTOMY     Family History  Problem Relation Age of Onset  . Stroke Mother   . Hypertension Mother   . Heart disease Mother   . Heart disease Father        CAD/MI-fatal sudden death  . Colon cancer Father   . Colon  cancer Sister        colon cancer 20090-survivor  . Heart disease Paternal Uncle   . Colon cancer Paternal Uncle   . Heart disease Paternal Grandfather   . Colon cancer Paternal Aunt   . Colon cancer Cousin   . Colon cancer Cousin   . Colon cancer Paternal Uncle   . Stomach cancer Neg Hx   . Rectal cancer Neg Hx   . Esophageal cancer Neg Hx    Social History   Social History  . Marital status: Divorced    Spouse name: N/A  . Number of children: 3  . Years of education: 39   Occupational History  . contractor    Social History Main Topics  . Smoking status: Never Smoker  . Smokeless tobacco: Never Used  . Alcohol use 0.5 oz/week    1 Standard drinks or equivalent per week     Comment:  social  . Drug use: No  . Sexual activity: Not Currently    Partners: Female   Other Topics Concern  . 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 3 sons - '74, '76, '87.  1 grandchild.   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 No pets. Serially monogamous.   ACP - he has a living will: DNR, DNI.   ROS-see HPI   + = Positive Constitutional:    weight loss, night sweats, fevers, chills, fatigue, lassitude. HEENT:    headaches, difficulty swallowing, tooth/dental problems, sore throat,       sneezing, itching, ear ache, nasal congestion, post nasal drip, snoring CV:    chest pain, orthopnea, PND, swelling in lower extremities, anasarca,                                                       dizziness, palpitations Resp:   shortness of breath with exertion or at rest.                productive cough,   non-productive cough, coughing up of blood.              change in color of mucus.  wheezing.   Skin:    rash or lesions. GI:  No-   heartburn, indigestion, abdominal pain, nausea, vomiting, diarrhea,                 change in bowel habits, loss of appetite GU: dysuria, change in color of urine, no urgency or frequency.   flank pain. MS:   joint pain, stiffness, decreased range of motion, back pain. Neuro-     nothing unusual Psych:  change in mood or affect.  depression or anxiety.   memory loss.  OBJ- Physical Exam General- Alert, Oriented, Affect-appropriate, Distress- none acute Skin- rash-none, lesions- none, excoriation- none Lymphadenopathy- none Head- atraumatic            Eyes- Gross vision intact, PERRLA, conjunctivae and secretions clear            Ears- Hearing, canals-normal            Nose- Clear, no-Septal dev, mucus, polyps, erosion, perforation             Throat- Mallampati II-III , mucosa clear , drainage- none, tonsils- atrophic, +  hoarse Neck- flexible , trachea midline, no  stridor , thyroid nl, carotid no bruit Chest - symmetrical excursion , unlabored           Heart/CV- RRR , no murmur , no gallop  , no rub, nl s1 s2                           - JVD- none , edema- none, stasis changes- none, varices- none           Lung- clear to P&A, wheeze- none, cough- none , dullness-none, rub- none           Chest wall-  Abd-  Br/ Gen/ Rectal- Not done, not indicated Extrem- cyanosis- none, clubbing, none, atrophy- none, strength- nl Neuro- grossly intact to observation

## 2017-06-01 NOTE — Assessment & Plan Note (Signed)
He has been compliant with CPAP for a long time but machine is old and he would like to change DME to [facility]. We will take the opportunity change to auto Pap. He understands he may be required by insurance to update his sleep study Plan-replacement for old CPAP machine changing to auto 5-15, changing DME to [facility] at his request

## 2017-06-01 NOTE — Assessment & Plan Note (Signed)
He has a history of vocal cord nodules. He was unclear about duration but review of records indicates chronic hoarseness. I suggested update by ENT if any question

## 2017-06-01 NOTE — Patient Instructions (Signed)
Order- change DME from [facility] to [facility].  Replace old CPAP machine, auto 5-15, mask of choice, humidifier  Dx OSA   Suggest you see an ENT if your hoarseness doesn't clear up.

## 2017-06-05 ENCOUNTER — Other Ambulatory Visit: Payer: Self-pay | Admitting: Family

## 2017-06-05 DIAGNOSIS — F329 Major depressive disorder, single episode, unspecified: Secondary | ICD-10-CM

## 2017-06-05 DIAGNOSIS — F32A Depression, unspecified: Secondary | ICD-10-CM

## 2017-06-06 DIAGNOSIS — S52134D Nondisplaced fracture of neck of right radius, subsequent encounter for closed fracture with routine healing: Secondary | ICD-10-CM | POA: Diagnosis not present

## 2017-06-09 DIAGNOSIS — S52134D Nondisplaced fracture of neck of right radius, subsequent encounter for closed fracture with routine healing: Secondary | ICD-10-CM | POA: Diagnosis not present

## 2017-06-12 DIAGNOSIS — S52134D Nondisplaced fracture of neck of right radius, subsequent encounter for closed fracture with routine healing: Secondary | ICD-10-CM | POA: Diagnosis not present

## 2017-06-15 DIAGNOSIS — S52134D Nondisplaced fracture of neck of right radius, subsequent encounter for closed fracture with routine healing: Secondary | ICD-10-CM | POA: Diagnosis not present

## 2017-06-19 DIAGNOSIS — S52134D Nondisplaced fracture of neck of right radius, subsequent encounter for closed fracture with routine healing: Secondary | ICD-10-CM | POA: Diagnosis not present

## 2017-06-22 DIAGNOSIS — S52134D Nondisplaced fracture of neck of right radius, subsequent encounter for closed fracture with routine healing: Secondary | ICD-10-CM | POA: Diagnosis not present

## 2017-06-26 DIAGNOSIS — S52134D Nondisplaced fracture of neck of right radius, subsequent encounter for closed fracture with routine healing: Secondary | ICD-10-CM | POA: Diagnosis not present

## 2017-06-29 DIAGNOSIS — S52134D Nondisplaced fracture of neck of right radius, subsequent encounter for closed fracture with routine healing: Secondary | ICD-10-CM | POA: Diagnosis not present

## 2017-06-30 DIAGNOSIS — S52134D Nondisplaced fracture of neck of right radius, subsequent encounter for closed fracture with routine healing: Secondary | ICD-10-CM | POA: Diagnosis not present

## 2017-07-13 ENCOUNTER — Ambulatory Visit (INDEPENDENT_AMBULATORY_CARE_PROVIDER_SITE_OTHER): Payer: PPO

## 2017-07-13 DIAGNOSIS — Z23 Encounter for immunization: Secondary | ICD-10-CM

## 2017-07-17 DIAGNOSIS — S52134D Nondisplaced fracture of neck of right radius, subsequent encounter for closed fracture with routine healing: Secondary | ICD-10-CM | POA: Diagnosis not present

## 2017-07-21 DIAGNOSIS — S52134D Nondisplaced fracture of neck of right radius, subsequent encounter for closed fracture with routine healing: Secondary | ICD-10-CM | POA: Diagnosis not present

## 2017-07-24 ENCOUNTER — Other Ambulatory Visit: Payer: Self-pay | Admitting: Internal Medicine

## 2017-07-24 DIAGNOSIS — F32A Depression, unspecified: Secondary | ICD-10-CM

## 2017-07-24 DIAGNOSIS — F329 Major depressive disorder, single episode, unspecified: Secondary | ICD-10-CM

## 2017-07-26 NOTE — Telephone Encounter (Signed)
[facility].  Requesting script to be sent this afternoon.

## 2017-07-26 NOTE — Telephone Encounter (Signed)
LVM for patient to let me know if they use [facility] or [facility]. I have these refills requested from [facility] but looks like he has some at [facility].

## 2017-08-01 DIAGNOSIS — S52134D Nondisplaced fracture of neck of right radius, subsequent encounter for closed fracture with routine healing: Secondary | ICD-10-CM | POA: Diagnosis not present

## 2017-08-11 DIAGNOSIS — H01025 Squamous blepharitis left lower eyelid: Secondary | ICD-10-CM | POA: Diagnosis not present

## 2017-08-11 DIAGNOSIS — H2513 Age-related nuclear cataract, bilateral: Secondary | ICD-10-CM | POA: Diagnosis not present

## 2017-08-11 DIAGNOSIS — H04123 Dry eye syndrome of bilateral lacrimal glands: Secondary | ICD-10-CM | POA: Diagnosis not present

## 2017-08-11 DIAGNOSIS — H01024 Squamous blepharitis left upper eyelid: Secondary | ICD-10-CM | POA: Diagnosis not present

## 2017-08-11 DIAGNOSIS — H01021 Squamous blepharitis right upper eyelid: Secondary | ICD-10-CM | POA: Diagnosis not present

## 2017-08-11 DIAGNOSIS — H01022 Squamous blepharitis right lower eyelid: Secondary | ICD-10-CM | POA: Diagnosis not present

## 2017-08-23 ENCOUNTER — Other Ambulatory Visit: Payer: Self-pay | Admitting: Internal Medicine

## 2017-08-23 DIAGNOSIS — F329 Major depressive disorder, single episode, unspecified: Secondary | ICD-10-CM

## 2017-08-23 DIAGNOSIS — F32A Depression, unspecified: Secondary | ICD-10-CM

## 2017-08-24 ENCOUNTER — Encounter: Payer: Self-pay | Admitting: Family Medicine

## 2017-08-24 ENCOUNTER — Ambulatory Visit: Payer: PPO | Admitting: Family Medicine

## 2017-08-24 VITALS — BP 120/72 | HR 61 | Temp 97.7°F | Wt 248.3 lb

## 2017-08-24 DIAGNOSIS — R0789 Other chest pain: Secondary | ICD-10-CM | POA: Diagnosis not present

## 2017-08-24 DIAGNOSIS — K219 Gastro-esophageal reflux disease without esophagitis: Secondary | ICD-10-CM

## 2017-08-24 DIAGNOSIS — Z6832 Body mass index (BMI) 32.0-32.9, adult: Secondary | ICD-10-CM | POA: Diagnosis not present

## 2017-08-24 NOTE — Patient Instructions (Addendum)
BEFORE YOU LEAVE: -EKG -follow up: w/ PCP in 2-4 weeks  Start over the counter Prilosec or Nexium and take once daily.  Follow the instructions below regarding diet and other recommendations for acid reflux.  Follow the instructions below regarding a diet and acid reflux.  Eat a healthy diet and work on weight reduction.  Seek emergency care if any concerns about your heart and chest pain, chest pain with activity that resolves with rest, chest pain with trouble breathing or other severe symptoms.   Food Choices for Gastroesophageal Reflux Disease, Adult When you have gastroesophageal reflux disease (GERD), the foods you eat and your eating habits are very important. Choosing the right foods can help ease your discomfort. What guidelines do I need to follow?  Choose fruits, vegetables, whole grains, and low-fat dairy products.  Choose low-fat meat, fish, and poultry.  Limit fats such as oils, salad dressings, butter, nuts, and avocado.  Keep a food diary. This helps you identify foods that cause symptoms.  Avoid foods that cause symptoms. These may be different for everyone.  Eat small meals often instead of 3 large meals a day.  Eat your meals slowly, in a place where you are relaxed.  Limit fried foods.  Cook foods using methods other than frying.  Avoid drinking alcohol.  Avoid drinking large amounts of liquids with your meals.  Avoid bending over or lying down until 2-3 hours after eating. What foods are not recommended? These are some foods and drinks that may make your symptoms worse: Vegetables Tomatoes. Tomato juice. Tomato and spaghetti sauce. Chili peppers. Onion and garlic. Horseradish. Fruits Oranges, grapefruit, and lemon (fruit and juice). Meats High-fat meats, fish, and poultry. This includes hot dogs, ribs, ham, sausage, salami, and bacon. Dairy Whole milk and chocolate milk. Sour cream. Cream. Butter. Ice cream. Cream cheese. Drinks Coffee and tea.  Bubbly (carbonated) drinks or energy drinks. Condiments Hot sauce. Barbecue sauce. Sweets/Desserts Chocolate and cocoa. Donuts. Peppermint and spearmint. Fats and Oils High-fat foods. This includes [place] fries and potato chips. Other Vinegar. Strong spices. This includes black pepper, white pepper, red pepper, cayenne, curry powder, cloves, ginger, and chili powder. The items listed above may not be a complete list of foods and drinks to avoid. Contact your dietitian for more information. This information is not intended to replace advice given to you by your health care provider. Make sure you discuss any questions you have with your health care provider. Document Released: 02/21/2012 Document Revised: 01/28/2016 Document Reviewed: 06/26/2013 Elsevier Interactive Patient Education  2017 [employer].

## 2017-08-24 NOTE — Progress Notes (Signed)
HPI:  John Blackburn is a pleasant 71 year old with a past medical history of hypertension, acid reflux, peptic ulcer disease and obesity here for an acute visit for heartburn: -He ate pizza last night before going to bed -He had heartburn and a sensation of acid in his throat this morning when he woke up -This occurred at rest, he is fairly active and has had no symptoms of chest pain, trouble breathing, neck pain or any other symptoms with exercise -Symptoms resolved with drinking a milkshake -He has a history of acid reflux and used to be on medication for this, he reports a remote peptic ulcer disease -No abdominal pain, symptoms after eating today, fevers, malaise, hematochezia or melena, dysphasia, nausea or vomiting, shortness of breath or cough, palpitations or exertional chest pain  ROS: See pertinent positives and negatives per HPI.  Past Medical History:  Diagnosis Date  . Allergy   . 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 Clotting disorder [facility])    November 2014, no longer on blood thinngers  . Depression    on multiple meds. Has been seen at [facility]. and Dr. Sabra Heck is his prescriber  . Diverticulitis of colon 2000 's   treated as an outpatient  . GERD (gastroesophageal reflux disease)    UGI done August '12 - ulcer/. Dr. Ferdinand Lango, gastroenterologist in [facility].   [name] Hx of adenomatous colonic polyps   . Hyperlipidemia    last lipid panel: HDL 44, LDL 117  . Hypertension   . Peptic ulcer    in the past and just recently-August '12  . Pulmonary emboli [facility])    noted October 2014 - treated by Dr. Linda Hedges  . skin cancer    skin CA  . Sleep apnea, primary central    wears CPAP    Past Surgical History:  Procedure Laterality Date  . ANAL RECTAL MANOMETRY N/A 11/30/2016   Procedure: ANO RECTAL MANOMETRY;  Surgeon: Mauri Pole, MD;  Location: [facility];  Service: Endoscopy;  Laterality: N/A;  . CHOLECYSTECTOMY  1990   laproscopic   .  HERNIA REPAIR    . JOINT REPLACEMENT     knee  . PROSTATE SURGERY     needle biopsy's, TURP  . TKR bilateral  2006   Dr. Violet Baldy  . UPPER GASTROINTESTINAL ENDOSCOPY    . VASECTOMY      Family History  Problem Relation Age of Onset  . Stroke Mother   . Hypertension Mother   . Heart disease Mother   . Heart disease Father        CAD/MI-fatal sudden death  . Colon cancer Father   . Colon cancer Sister        colon cancer 20090-survivor  . Heart disease Paternal Uncle   . Colon cancer Paternal Uncle   . Heart disease Paternal Grandfather   . Colon cancer Paternal Aunt   . Colon cancer Cousin   . Colon cancer Cousin   . Colon cancer Paternal Uncle   . Stomach cancer Neg Hx   . Rectal cancer Neg Hx   . Esophageal cancer Neg Hx     Social History   Socioeconomic History  . Marital status: Divorced    Spouse name: None  . Number of children: 3  . Years of education: 33  . Highest education level: None  Social Needs  . Financial resource strain: None  . Food insecurity - worry: None  . Food insecurity -  inability: None  . Transportation needs - medical: None  . Transportation needs - non-medical: None  Occupational History  . Occupation: Chief Strategy Officer  Tobacco Use  . Smoking status: Never Smoker  . Smokeless tobacco: Never Used  Substance and Sexual Activity  . Alcohol use: Yes    Alcohol/week: 0.5 oz    Types: 1 Standard drinks or equivalent per week    Comment: social  . Drug use: No  . Sexual activity: Not Currently    Partners: Female  Other Topics Concern  . None  Social History Narrative   HSG, [employer] -BS business admin. Married '71- 34 yrs/divorced. 3 sons - '74, '76, '87.  1 grandchild.   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 No pets. Serially monogamous.   ACP - he has a living will: DNR, DNI.     Current Outpatient Medications:  .  atenolol-chlorthalidone (TENORETIC) 50-25 MG tablet, TAKE ONE TABLET BY MOUTH ONCE  DAILY, Disp: 90 tablet, Rfl: 3 .  buPROPion (WELLBUTRIN XL) 300 MG 24 hr tablet, TAKE 1 TABLET BY MOUTH ONCE DAILY, Disp: 30 tablet, Rfl: 0 .  Magnesium 250 MG TABS, Take by mouth., Disp: , Rfl:  .  Melatonin 1 MG/4ML LIQD, Take by mouth at bedtime., Disp: , Rfl:  .  Multiple Vitamin (MULTI-VITAMINS) TABS, Take by mouth., Disp: , Rfl:  .  sertraline (ZOLOFT) 50 MG tablet, TAKE ONE TABLET BY MOUTH ONCE DAILY, Disp: 30 tablet, Rfl: 2 .  Turmeric 500 MG CAPS, Take by mouth., Disp: , Rfl:   EXAM:  Vitals:   08/24/17 1318  BP: 120/72  Pulse: 61  Temp: 97.7 F (36.5 C)  SpO2: 97%    Body mass index is 32.76 kg/m.  GENERAL: vitals reviewed and listed above, alert, oriented, appears well hydrated and in no acute distress  HEENT: atraumatic, conjunttiva clear, no obvious abnormalities on inspection of external nose and ears  NECK: no obvious masses on inspection  LUNGS: clear to auscultation bilaterally, no wheezes, rales or rhonchi, good air movement  CV: HRRR, no peripheral edema  ABD: Bowel sounds positive in all 4 quadrants, soft, nontender to palpation rebound or guarding  MS: moves all extremities without noticeable abnormality  PSYCH: pleasant and cooperative, no obvious depression or anxiety  ASSESSMENT AND PLAN:  Discussed the following assessment and plan:  Gastroesophageal reflux disease, esophagitis presence not specified  Atypical chest pain - Plan: EKG 12-Lead  BMI 32.0-32.9,adult  -we discussed possible serious and likely etiologies, workup and treatment, treatment risks and return precautions -symptoms are classic for acid reflux, now resolved -asymptomatic here -We will check an EKG given his past medical history and family history, mild bradycardia, asymptomatic, has had abnormal EKG in the past with extensive cardiac evaluation -May need re-evaluation if any further symptoms -after this discussion, Khale opted for over-the-counter PPI, lifestyle  recommendations, advised weight reduction, follow-up with PCP in 2-4 weeks -of course, we advised Ronn  to return or notify a doctor immediately if symptoms worsen or persist or new concerns arise.  Emergency precautions also discussed.  Patient Instructions  BEFORE YOU LEAVE: -EKG -follow up: w/ PCP in 2-4 weeks  Start over the counter Prilosec or Nexium and take once daily.  Follow the instructions below regarding diet and other recommendations for acid reflux.  Follow the instructions below regarding a diet and acid reflux.  Eat a healthy diet and work on weight reduction.  Seek emergency care if any concerns about your heart and  chest pain, chest pain with activity that resolves with rest, chest pain with trouble breathing or other severe symptoms.   Food Choices for Gastroesophageal Reflux Disease, Adult When you have gastroesophageal reflux disease (GERD), the foods you eat and your eating habits are very important. Choosing the right foods can help ease your discomfort. What guidelines do I need to follow?  Choose fruits, vegetables, whole grains, and low-fat dairy products.  Choose low-fat meat, fish, and poultry.  Limit fats such as oils, salad dressings, butter, nuts, and avocado.  Keep a food diary. This helps you identify foods that cause symptoms.  Avoid foods that cause symptoms. These may be different for everyone.  Eat small meals often instead of 3 large meals a day.  Eat your meals slowly, in a place where you are relaxed.  Limit fried foods.  Cook foods using methods other than frying.  Avoid drinking alcohol.  Avoid drinking large amounts of liquids with your meals.  Avoid bending over or lying down until 2-3 hours after eating. What foods are not recommended? These are some foods and drinks that may make your symptoms worse: Vegetables Tomatoes. Tomato juice. Tomato and spaghetti sauce. Chili peppers. Onion and garlic.  Horseradish. Fruits Oranges, grapefruit, and lemon (fruit and juice). Meats High-fat meats, fish, and poultry. This includes hot dogs, ribs, ham, sausage, salami, and bacon. Dairy Whole milk and chocolate milk. Sour cream. Cream. Butter. Ice cream. Cream cheese. Drinks Coffee and tea. Bubbly (carbonated) drinks or energy drinks. Condiments Hot sauce. Barbecue sauce. Sweets/Desserts Chocolate and cocoa. Donuts. Peppermint and spearmint. Fats and Oils High-fat foods. This includes [place] fries and potato chips. Other Vinegar. Strong spices. This includes black pepper, white pepper, red pepper, cayenne, curry powder, cloves, ginger, and chili powder. The items listed above may not be a complete list of foods and drinks to avoid. Contact your dietitian for more information. This information is not intended to replace advice given to you by your health care provider. Make sure you discuss any questions you have with your health care provider. Document Released: 02/21/2012 Document Revised: 01/28/2016 Document Reviewed: 06/26/2013 Elsevier Interactive Patient Education  2017 [place]., DO

## 2017-08-30 ENCOUNTER — Other Ambulatory Visit: Payer: Self-pay

## 2017-08-30 ENCOUNTER — Emergency Department (HOSPITAL_COMMUNITY): Payer: PPO

## 2017-08-30 ENCOUNTER — Inpatient Hospital Stay (HOSPITAL_COMMUNITY)
Admission: EM | Admit: 2017-08-30 | Discharge: 2017-09-04 | DRG: 247 | Disposition: A | Discharge status: Home Health | Payer: PPO | Attending: Cardiovascular Disease | Admitting: Cardiovascular Disease

## 2017-08-30 ENCOUNTER — Inpatient Hospital Stay (HOSPITAL_COMMUNITY): Payer: PPO

## 2017-08-30 ENCOUNTER — Ambulatory Visit: Payer: Self-pay | Admitting: *Deleted

## 2017-08-30 ENCOUNTER — Encounter (HOSPITAL_COMMUNITY): Payer: Self-pay | Admitting: Physician Assistant

## 2017-08-30 DIAGNOSIS — E785 Hyperlipidemia, unspecified: Secondary | ICD-10-CM | POA: Diagnosis present

## 2017-08-30 DIAGNOSIS — Z9989 Dependence on other enabling machines and devices: Secondary | ICD-10-CM

## 2017-08-30 DIAGNOSIS — R9431 Abnormal electrocardiogram [ECG] [EKG]: Secondary | ICD-10-CM | POA: Diagnosis not present

## 2017-08-30 DIAGNOSIS — Z9861 Coronary angioplasty status: Secondary | ICD-10-CM

## 2017-08-30 DIAGNOSIS — E663 Overweight: Secondary | ICD-10-CM | POA: Diagnosis not present

## 2017-08-30 DIAGNOSIS — Z8249 Family history of ischemic heart disease and other diseases of the circulatory system: Secondary | ICD-10-CM

## 2017-08-30 DIAGNOSIS — Z881 Allergy status to other antibiotic agents status: Secondary | ICD-10-CM

## 2017-08-30 DIAGNOSIS — Z86711 Personal history of pulmonary embolism: Secondary | ICD-10-CM | POA: Diagnosis present

## 2017-08-30 DIAGNOSIS — R0600 Dyspnea, unspecified: Secondary | ICD-10-CM | POA: Diagnosis not present

## 2017-08-30 DIAGNOSIS — I2510 Atherosclerotic heart disease of native coronary artery without angina pectoris: Secondary | ICD-10-CM | POA: Diagnosis not present

## 2017-08-30 DIAGNOSIS — R0602 Shortness of breath: Secondary | ICD-10-CM

## 2017-08-30 DIAGNOSIS — E876 Hypokalemia: Secondary | ICD-10-CM | POA: Diagnosis not present

## 2017-08-30 DIAGNOSIS — M810 Age-related osteoporosis without current pathological fracture: Secondary | ICD-10-CM | POA: Diagnosis not present

## 2017-08-30 DIAGNOSIS — Z8719 Personal history of other diseases of the digestive system: Secondary | ICD-10-CM

## 2017-08-30 DIAGNOSIS — F329 Major depressive disorder, single episode, unspecified: Secondary | ICD-10-CM | POA: Diagnosis present

## 2017-08-30 DIAGNOSIS — G4733 Obstructive sleep apnea (adult) (pediatric): Secondary | ICD-10-CM | POA: Diagnosis present

## 2017-08-30 DIAGNOSIS — Z8241 Family history of sudden cardiac death: Secondary | ICD-10-CM

## 2017-08-30 DIAGNOSIS — I341 Nonrheumatic mitral (valve) prolapse: Secondary | ICD-10-CM

## 2017-08-30 DIAGNOSIS — K219 Gastro-esophageal reflux disease without esophagitis: Secondary | ICD-10-CM | POA: Diagnosis not present

## 2017-08-30 DIAGNOSIS — E7800 Pure hypercholesterolemia, unspecified: Secondary | ICD-10-CM

## 2017-08-30 DIAGNOSIS — T45525A Adverse effect of antithrombotic drugs, initial encounter: Secondary | ICD-10-CM | POA: Diagnosis not present

## 2017-08-30 DIAGNOSIS — R748 Abnormal levels of other serum enzymes: Secondary | ICD-10-CM | POA: Diagnosis not present

## 2017-08-30 DIAGNOSIS — I509 Heart failure, unspecified: Secondary | ICD-10-CM | POA: Diagnosis present

## 2017-08-30 DIAGNOSIS — I110 Hypertensive heart disease with heart failure: Secondary | ICD-10-CM | POA: Diagnosis not present

## 2017-08-30 DIAGNOSIS — N400 Enlarged prostate without lower urinary tract symptoms: Secondary | ICD-10-CM | POA: Diagnosis present

## 2017-08-30 DIAGNOSIS — I10 Essential (primary) hypertension: Secondary | ICD-10-CM | POA: Diagnosis not present

## 2017-08-30 DIAGNOSIS — Z8659 Personal history of other mental and behavioral disorders: Secondary | ICD-10-CM

## 2017-08-30 DIAGNOSIS — Z955 Presence of coronary angioplasty implant and graft: Secondary | ICD-10-CM

## 2017-08-30 DIAGNOSIS — I5031 Acute diastolic (congestive) heart failure: Secondary | ICD-10-CM | POA: Diagnosis not present

## 2017-08-30 DIAGNOSIS — I214 Non-ST elevation (NSTEMI) myocardial infarction: Secondary | ICD-10-CM | POA: Diagnosis present

## 2017-08-30 DIAGNOSIS — I5032 Chronic diastolic (congestive) heart failure: Secondary | ICD-10-CM

## 2017-08-30 DIAGNOSIS — I5041 Acute combined systolic (congestive) and diastolic (congestive) heart failure: Secondary | ICD-10-CM | POA: Diagnosis not present

## 2017-08-30 HISTORY — DX: Atherosclerotic heart disease of native coronary artery without angina pectoris: I25.10

## 2017-08-30 LAB — I-STAT CHEM 8, ED
BUN: 11 mg/dL (ref 6–20)
CALCIUM ION: 1.1 mmol/L — AB (ref 1.15–1.40)
CHLORIDE: 98 mmol/L — AB (ref 101–111)
CREATININE: 0.8 mg/dL (ref 0.61–1.24)
GLUCOSE: 133 mg/dL — AB (ref 65–99)
HCT: 41 % (ref 39.0–52.0)
Hemoglobin: 13.9 g/dL (ref 13.0–17.0)
Potassium: 3.1 mmol/L — ABNORMAL LOW (ref 3.5–5.1)
Sodium: 140 mmol/L (ref 135–145)
TCO2: 28 mmol/L (ref 22–32)

## 2017-08-30 LAB — CBC
HCT: 39.1 % (ref 39.0–52.0)
Hemoglobin: 13.2 g/dL (ref 13.0–17.0)
MCH: 33.5 pg (ref 26.0–34.0)
MCHC: 33.8 g/dL (ref 30.0–36.0)
MCV: 99.2 fL (ref 78.0–100.0)
PLATELETS: 198 10*3/uL (ref 150–400)
RBC: 3.94 MIL/uL — ABNORMAL LOW (ref 4.22–5.81)
RDW: 13.6 % (ref 11.5–15.5)
WBC: 8.6 10*3/uL (ref 4.0–10.5)

## 2017-08-30 LAB — BASIC METABOLIC PANEL
Anion gap: 11 (ref 5–15)
BUN: 10 mg/dL (ref 6–20)
CHLORIDE: 99 mmol/L — AB (ref 101–111)
CO2: 27 mmol/L (ref 22–32)
CREATININE: 0.83 mg/dL (ref 0.61–1.24)
Calcium: 8.7 mg/dL — ABNORMAL LOW (ref 8.9–10.3)
GFR calc Af Amer: >60 mL/min (ref >60)
GFR calc non Af Amer: >60 mL/min (ref >60)
GLUCOSE: 136 mg/dL — AB (ref 65–99)
Potassium: 3 mmol/L — ABNORMAL LOW (ref 3.5–5.1)
SODIUM: 137 mmol/L (ref 135–145)

## 2017-08-30 LAB — HEPARIN LEVEL (UNFRACTIONATED): Heparin Unfractionated: <0.1 IU/mL — ABNORMAL LOW (ref 0.30–0.70)

## 2017-08-30 LAB — I-STAT TROPONIN, ED: Troponin i, poc: 2.74 ng/mL (ref 0.00–0.08)

## 2017-08-30 LAB — ECHOCARDIOGRAM COMPLETE
Height: 73 in
Weight: 3814.4 oz

## 2017-08-30 LAB — BRAIN NATRIURETIC PEPTIDE: B NATRIURETIC PEPTIDE 5: 349.2 pg/mL — AB (ref 0.0–100.0)

## 2017-08-30 LAB — TROPONIN I
Troponin I: 2.6 ng/mL (ref <0.03)
Troponin I: 2.75 ng/mL (ref <0.03)

## 2017-08-30 IMAGING — DX DG CHEST 1V PORT
1 series · 1 of 1 positions shown · non-contrast
Comparison: [DATE]

CLINICAL DATA: Shortness of Breath

EXAM:
PORTABLE CHEST 1 VIEW

[chest ap]
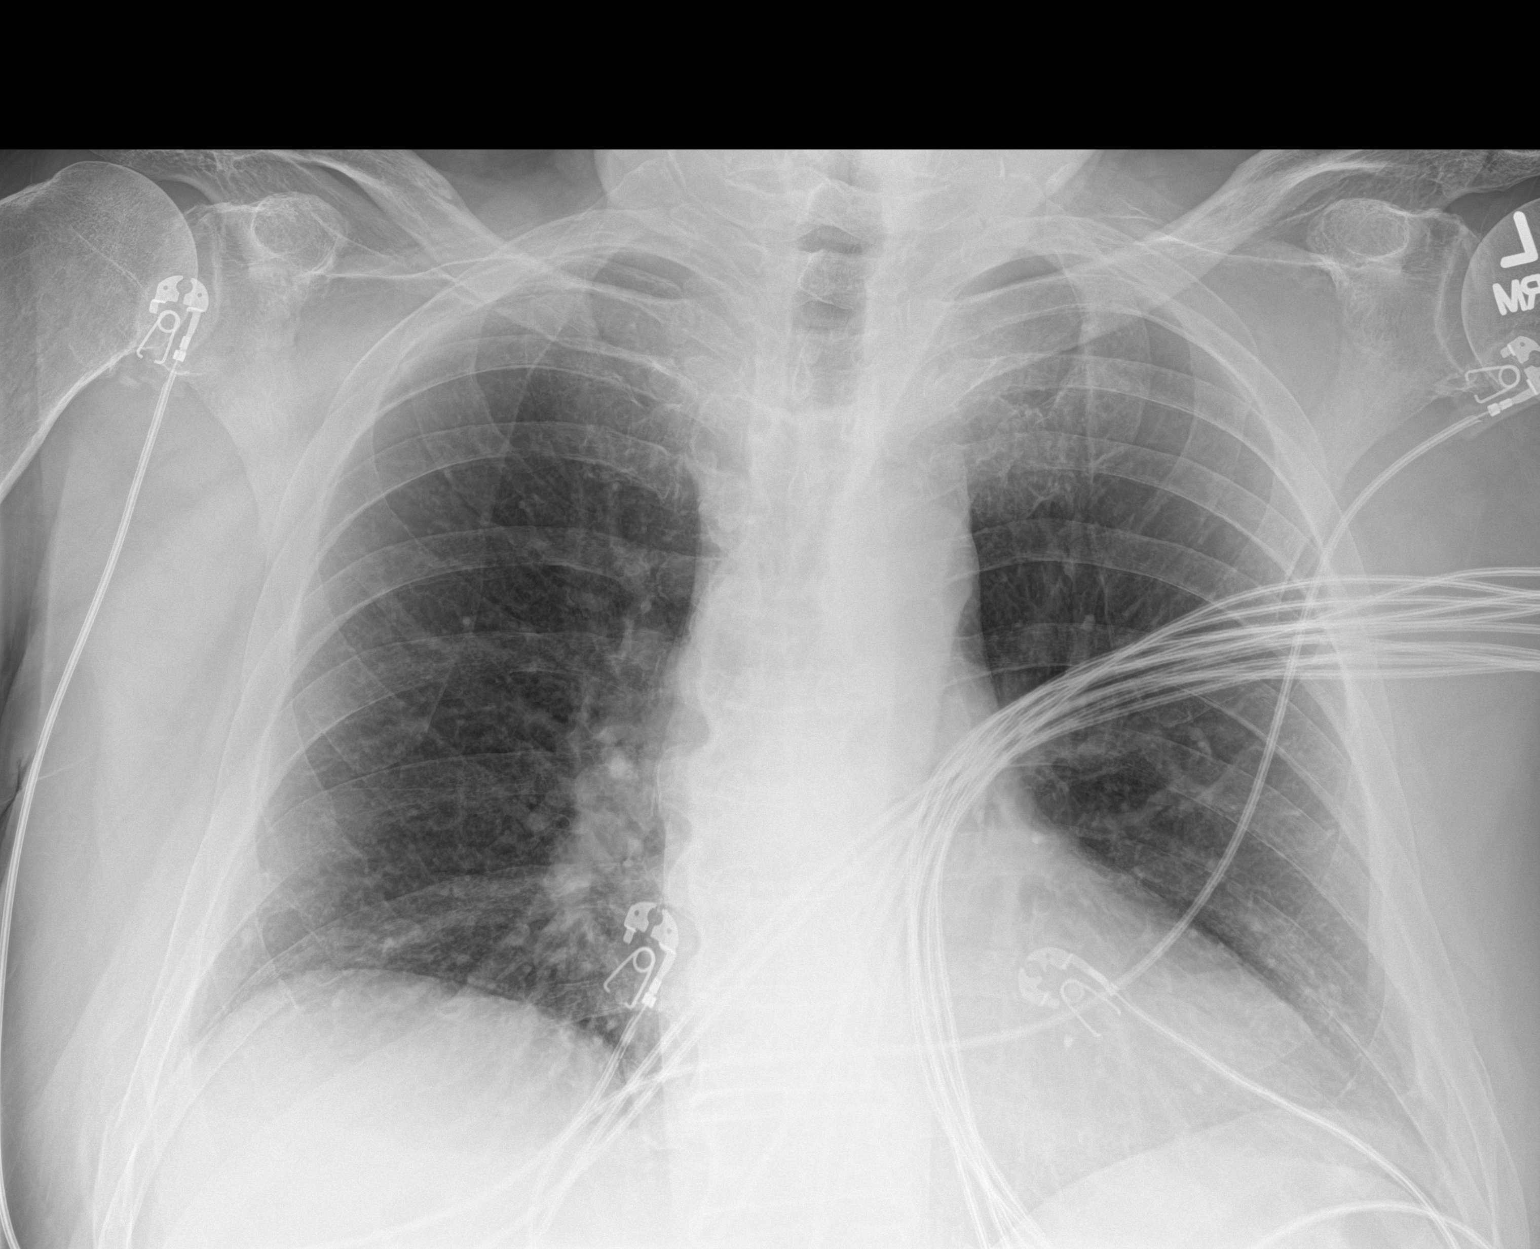

[1 of 1 positions shown; findings below may reference images not displayed]

FINDINGS: There is a small calcified granuloma in the right base. There is no
edema or consolidation. Heart size and pulmonary vascularity are
normal. No adenopathy. Bones are osteoporotic. There is a focus of
bony bridging anteriorly between the right fifth and sixth ribs.
IMPRESSION: Small granuloma right base. No edema or consolidation. Stable
cardiac silhouette.

## 2017-08-30 MED ORDER — HEPARIN BOLUS VIA INFUSION
3000.0000 [IU] | Freq: Once | INTRAVENOUS | Status: AC
  Administered 2017-08-30: 3000 [IU] via INTRAVENOUS
  Filled 2017-08-30: qty 3000

## 2017-08-30 MED ORDER — FUROSEMIDE 10 MG/ML IJ SOLN
40.0000 mg | Freq: Once | INTRAMUSCULAR | Status: AC
  Administered 2017-08-30: 40 mg via INTRAVENOUS
  Filled 2017-08-30: qty 4

## 2017-08-30 MED ORDER — POTASSIUM CHLORIDE CRYS ER 20 MEQ PO TBCR
40.0000 meq | EXTENDED_RELEASE_TABLET | Freq: Once | ORAL | Status: AC
  Administered 2017-08-30: 40 meq via ORAL
  Filled 2017-08-30: qty 2

## 2017-08-30 MED ORDER — SODIUM CHLORIDE 0.9% FLUSH: 3.0000 mL | INTRAVENOUS | Status: DC | PRN

## 2017-08-30 MED ORDER — ASPIRIN EC 81 MG PO TBEC
81.0000 mg | DELAYED_RELEASE_TABLET | Freq: Every day | ORAL | Status: DC
  Administered 2017-08-30 – 2017-09-04 (×6): 81 mg via ORAL
  Filled 2017-08-30 (×7): qty 1

## 2017-08-30 MED ORDER — SODIUM CHLORIDE 0.9% FLUSH
3.0000 mL | Freq: Two times a day (BID) | INTRAVENOUS | Status: DC
  Administered 2017-08-30 – 2017-08-31 (×2): 3 mL via INTRAVENOUS

## 2017-08-30 MED ORDER — SODIUM CHLORIDE 0.9 % IV SOLN: 250.0000 mL | INTRAVENOUS | Status: DC | PRN

## 2017-08-30 MED ORDER — ASPIRIN 81 MG PO CHEW
324.0000 mg | CHEWABLE_TABLET | Freq: Once | ORAL | Status: AC
  Administered 2017-08-30: 324 mg via ORAL
  Filled 2017-08-30: qty 4

## 2017-08-30 MED ORDER — HEPARIN BOLUS VIA INFUSION
4000.0000 [IU] | Freq: Once | INTRAVENOUS | Status: AC
  Administered 2017-08-30: 4000 [IU] via INTRAVENOUS
  Filled 2017-08-30: qty 4000

## 2017-08-30 MED ORDER — ACETAMINOPHEN 325 MG PO TABS: 650.0000 mg | ORAL_TABLET | ORAL | Status: DC | PRN

## 2017-08-30 MED ORDER — FUROSEMIDE 10 MG/ML IJ SOLN
40.0000 mg | Freq: Every day | INTRAMUSCULAR | Status: DC
  Administered 2017-08-31: 40 mg via INTRAVENOUS
  Filled 2017-08-30 (×2): qty 4

## 2017-08-30 MED ORDER — ONDANSETRON HCL 4 MG/2ML IJ SOLN: 4.0000 mg | Freq: Four times a day (QID) | INTRAMUSCULAR | Status: DC | PRN

## 2017-08-30 MED ORDER — HEPARIN SODIUM (PORCINE) 5000 UNIT/ML IJ SOLN: 4000.0000 [IU] | Freq: Once | INTRAMUSCULAR | Status: DC

## 2017-08-30 MED ORDER — HEPARIN (PORCINE) IN NACL 100-0.45 UNIT/ML-% IJ SOLN
1950.0000 [IU]/h | INTRAMUSCULAR | Status: DC
  Administered 2017-08-30: 1450 [IU]/h via INTRAVENOUS
  Administered 2017-08-31: 1750 [IU]/h via INTRAVENOUS
  Administered 2017-09-01: 1950 [IU]/h via INTRAVENOUS
  Filled 2017-08-30 (×4): qty 250

## 2017-08-30 NOTE — ED Notes (Signed)
Cards at bedside. Decision made not to take pt to cathlab at this time

## 2017-08-30 NOTE — H&P (Signed)
History & Physical    Patient ID: John Blackburn MRN: [id number], DOB/AGE: [id number]   Admit date: 08/30/2017 Primary Physician: Hoyt Koch, MD Primary Cardiologist: Dr. Gwenlyn Found   Patient Profile   John Blackburn is a 71yo M with no prior cardiac disease, however underwent a Myoview stress test that was negative in 2014. His other history includes hypertension, hyperlipidemia, OSA with CPAP use, post-surgical pulmonary embolism (2014), and BPH with elevated PSA who presented to the ED on 08/30/2017 with increasing shortness of breath, elevated troponin levels, and an abnormal EKG.  Past Medical History    Past Medical History:  Diagnosis Date  . Allergy   . 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 Clotting disorder [facility])    November 2014, no longer on blood thinngers  . Depression    on multiple meds. Has been seen at [facility]. and Dr. Sabra Heck is his prescriber  . Diverticulitis of colon 2000 's   treated as an outpatient  . GERD (gastroesophageal reflux disease)    UGI done August '12 - ulcer/. Dr. Ferdinand Lango, gastroenterologist in [facility].   [name] Hx of adenomatous colonic polyps   . Hyperlipidemia    last lipid panel: HDL 44, LDL 117  . Hypertension   . Peptic ulcer    in the past and just recently-August '12  . Pulmonary emboli [facility])    noted October 2014 - treated by Dr. Linda Hedges  . skin cancer    skin CA  . Sleep apnea, primary central    wears CPAP    Past Surgical History:  Procedure Laterality Date  . ANAL RECTAL MANOMETRY N/A 11/30/2016   Procedure: ANO RECTAL MANOMETRY;  Surgeon: Mauri Pole, MD;  Location: [facility];  Service: Endoscopy;  Laterality: N/A;  . CHOLECYSTECTOMY  1990   laproscopic   . HERNIA REPAIR    . JOINT REPLACEMENT     knee  . PROSTATE SURGERY     needle biopsy's, TURP  . TKR bilateral  2006   Dr. Violet Baldy  . UPPER GASTROINTESTINAL ENDOSCOPY    . VASECTOMY       Allergies  Allergies    Allergen Reactions  . Cephalexin Rash  . Levofloxacin Rash    History of Present Illness    John Blackburn is a 71yo M with a prior evaluation for possible cardiac ischemia and abnormal GTX test in 04/2013. Subsequently, he underwent a Myoview Lexiscan stress test in 05/2013, which was normal. Echocardiogram was performed (04/2013) showed an EF of 55-60% with no wall motion abnormalities and mild LVH. He reports having a strong family history of sudden cardiac death with family members in their late 17's to early 88's.   He presented to the ED on 08/30/17 after increasing shortness of breath over the past one week. He reports that he has been sleeping in a recliner during this time due to orthopnea when lying flat. He has had increased LE pitting edema, but otherwise has been in his usual state of health.   He has had no chest pain during this episiode. He denies increased sodium intake in his diet. 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 He reports that he wears a CPAP machine on a regular basis with no problems and is compliant with his medications. He sees a primary care provider for most of his care.    In the ED, his troponin level was 2.74, creatinine 0.08 and  K+ was 3.1. Hb/Hct 13.9 and 41.0. EKG showed ST elevation in lead III and ST depression in aVL, V2-V3. CXR revealed no edema or consolidation and a stable cardiac silhouette. There is a echocardiogram pending at this time. He was given 40 IV Lasix and 57meq of K+.   Home Medications    Prior to Admission medications   Medication Sig Start Date End Date Taking? Authorizing Provider  atenolol-chlorthalidone (TENORETIC) 50-25 MG tablet TAKE ONE TABLET BY MOUTH ONCE DAILY 07/26/17   Hoyt Koch, MD  buPROPion (WELLBUTRIN XL) 300 MG 24 hr tablet TAKE 1 TABLET BY MOUTH ONCE DAILY 08/23/17   Hoyt Koch, MD  Magnesium 250 MG TABS Take by mouth.    [provider]  Melatonin 1 MG/4ML LIQD Take  by mouth at bedtime.    [provider]  Multiple Vitamin (MULTI-VITAMINS) TABS Take by mouth.    [provider]  sertraline (ZOLOFT) 50 MG tablet TAKE ONE TABLET BY MOUTH ONCE DAILY 07/26/17   Hoyt Koch, MD  Turmeric 500 MG CAPS Take by mouth.    [provider]    Family History    Family History  Problem Relation Age of Onset  . Stroke Mother   . Hypertension Mother   . Heart disease Mother   . Heart disease Father        CAD/MI-fatal sudden death  . Colon cancer Father   . Colon cancer Sister        colon cancer 20090-survivor  . Heart disease Paternal Uncle   . Colon cancer Paternal Uncle   . Heart disease Paternal Grandfather   . Colon cancer Paternal Aunt   . Colon cancer Cousin   . Colon cancer Cousin   . Colon cancer Paternal Uncle   . Stomach cancer Neg Hx   . Rectal cancer Neg Hx   . Esophageal cancer Neg Hx     Social History    Social History   Socioeconomic History  . Marital status: Divorced    Spouse name: Not on file  . Number of children: 3  . Years of education: 38  . Highest education level: Not on file  Social Needs  . Financial resource strain: Not on file  . Food insecurity - worry: Not on file  . Food insecurity - inability: Not on file  . Transportation needs - medical: Not on file  . Transportation needs - non-medical: Not on file  Occupational History  . Occupation: Chief Strategy Officer- retired  Tobacco Use  . Smoking status: Never Smoker  . Smokeless tobacco: Never Used  Substance and Sexual Activity  . Alcohol use: Yes    Alcohol/week: 0.5 oz    Types: 1 Standard drinks or equivalent per week    Comment: social  . Drug use: No  . Sexual activity: Not Currently    Partners: Female  Other Topics Concern  . 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 3 sons - '74, '76, '87.  1 grandchild.   Work - Occupational psychologist.  Lives alone. No pets. Serially monogamous.   ACP - he has a living will: DNR, DNI.     Review of Systems   See HPI as above:   All other systems reviewed and are otherwise negative except as noted above.  Physical Exam    Blood pressure (!) 126/92, pulse 81, temperature 98.6 F (37 C), temperature source  Oral, resp. rate 20, height 6\' 3"  (1.905 m), weight 248 lb (112.5 kg), SpO2 98 %.   General: Well developed, well nourished, NAD Skin: Warm, dry, intact  Head: Normocephalic, atraumatic, clear, moist mucus membranes. Neck: Negative for carotid bruits. No JVD Lungs:Bilateral crackles in bases. Breathing is unlabored. Cardiovascular: RRR with S1 S2. No murmurs, rubs,or gallops Abdomen: Soft, non-tender, non-distended with normoactive bowel sounds. No obvious abdominal masses. MSK: Strength and tone appear normal for age. 5/5 in all extremities Extremities: 3+ bilateral pitting edema. No clubbing or cyanosis. DP/PT pulses 2+ bilaterally Neuro: Alert and oriented. No focal deficits. No facial asymmetry. MAE spontaneously. Psych: Responds to questions appropriately with normal affect.    Labs    Troponin [facility] of Care Test) Recent Labs    08/30/17 1007  TROPIPOC 2.74*   No results for input(s): CKTOTAL, CKMB, TROPONINI in the last 72 hours. Lab Results  Component Value Date   WBC 8.6 08/30/2017   HGB 13.9 08/30/2017   HCT 41.0 08/30/2017   MCV 99.2 08/30/2017   PLT 198 08/30/2017    Recent Labs  Lab 08/30/17 0955 08/30/17 1009  NA 137 140  K 3.0* 3.1*  CL 99* 98*  CO2 27  --   BUN 10 11  CREATININE 0.83 0.80  CALCIUM 8.7*  --   GLUCOSE 136* 133*   Lab Results  Component Value Date   CHOL 187 04/01/2016   HDL 35.50 (L) 04/01/2016   LDLCALC 128 (H) 04/01/2016   TRIG 118.0 04/01/2016   Lab Results  Component Value Date   DDIMER 0.68 (H) 05/26/2015     Radiology Studies    Dg Chest Portable 1 View  Result Date: 08/30/2017 CLINICAL DATA:  Shortness of  Breath EXAM: PORTABLE CHEST 1 VIEW COMPARISON:  September 23, 2016 FINDINGS: There is a small calcified granuloma in the right base. There is no edema or consolidation. Heart size and pulmonary vascularity are normal. No adenopathy. Bones are osteoporotic. There is a focus of bony bridging anteriorly between the right fifth and sixth ribs. IMPRESSION: Small granuloma right base. No edema or consolidation. Stable cardiac silhouette. Electronically Signed   By: Lowella Grip III M.D.   On: 08/30/2017 10:30    ECG & Cardiac Imaging    Echo 04/25/2013: Study Conclusions  - Left ventricle: The cavity size was normal. Wall thickness was increased in a pattern of mild LVH. Systolic function was normal. The estimated ejection fraction was in the range of 55% to 60%. Wall motion was normal; there were no regional wall motion abnormalities. Doppler parameters are consistent with abnormal left ventricular relaxation (grade 1 diastolic dysfunction). - Left atrium: The atrium was mildly dilated. - Right ventricle: The cavity size was mildly dilated. Transthoracic echocardiography. M-mode, complete 2D, spectral Doppler, and color Doppler. Height: Height: 190.5cm. Height: 75in. Weight: Weight: 108.9kg. Weight: 239.5lb. Body mass index: BMI: 30kg/m^2. Body surface area:  BSA: 2.4m^2. Blood pressure:   122/70. Patient status: Outpatient. Location: [facility]   Lexiscan Myoview Stress 05/14/2013: Impression Exercise Capacity:  Lexiscan with low level exercise. BP Response:  Normal blood pressure response. Clinical Symptoms:  No significant symptoms noted. ECG Impression:  No significant ST segment change suggestive of ischemia. Comparison with Prior Nuclear Study: No images to compare  Overall Impression:  Normal stress nuclear study.  LV Ejection Fraction: 61%.  LV Wall Motion:  NL LV Function; NL Wall Motion  Assessment & Plan   1. Elevated troponin  level: -Cycle  cardiac enzymes: 1st> 2.74 -EKG- ST elevation I lead III, ST depression I aVL, V2-V3 -Will hold off on BB given acute heart failure presentation -Will likely need ischemic evaluation once stable from respiratory standpoint   -Received ASA 324mg   -Lasix IV 40mg  and 9meq K+ replaced -Will evaluated response to one time dose of diuretic, consider daily dose -Currently chest pain free -Start IV heparin per pharmacy   2. Acute CHF: -Evidence of congestive heart failure -Lasix IV 40mg  x1 -CXR with no edema and stable cardiac silhouette -Strict I &O -Daily weight -Low sodium diet -Admission weight today 12/26/418> 248lb  3. Hypertension :  -Stable, 126/92, 125/79,134/90 -Pt takes home atenolol-chlorthalidone 50-25 daily, held BB at this time  4. Hyperlipidemia: -Does not appear to be on lipid lowering agent -Will check lipid profile  5. Hypokalemia:  -K+ 3.1, replaced with 60meq K+ today  -Follow with BMET in AM  Signed, Kathyrn Drown, NP 08/30/2017, 11:50 AM  Agree with note by Kathyrn Drown NP  Mr. Langlois is a 71 year old mildly overweight divorced Caucasian male without prior cardiac history.  He does have a history of hypertension and hyperlipidemia.  He has had cardiology workup in the past remotely which was on revealing.  He is noticed increasing shortness of breath over the last several weeks, worse this morning but really denies chest pain.  He was complaining of orthopnea and PND as well as dyspnea on exertion.  He came to the ER where EKG showed subtle ST segment elevation in the inferior leads with reciprocal anterior depression although he otherwise is completely asymptomatic and I do not believe this meets "STEMI criteria".  He does have 2+ pitting edema peripherally as well as basilar crackles and slight increase in jugular venous pressure.  His troponin is +2.74 and his BNP is mildly elevated in the 300 range.  His chest x-ray does not show pulmonary  edema however.  His potassium is low at 3.  Plan is to admit him, diurese him, replete his potassium and obtain a 2D echocardiogram.  He will also need systemic and a coagulation with IV heparin per pharmacy.  Once he is diuresed he will need a right left heart cath to further define his anatomy and physiology.  Lorretta Harp, M.D., [place], [facility], [place] [address] [place], [place]  [address]  [phone] 08/30/2017 12:03 PM

## 2017-08-30 NOTE — Progress Notes (Signed)
Pillager for heparin Indication: chest pain/ACS  Allergies  Allergen Reactions  . Cephalexin Rash  . Levofloxacin Rash    Patient Measurements: Height: 6\' 1"  (185.4 cm) Weight: 238 lb 6.4 oz (108.1 kg) IBW/kg (Calculated) : 79.9 Heparin Dosing Weight: 103 kg  Vital Signs: Temp: 97.9 F (36.6 C) (12/26 1144) Temp Source: Oral (12/26 1144) BP: 128/80 (12/26 1144) Pulse Rate: 81 (12/26 1144)  Labs: Recent Labs    08/30/17 0955 08/30/17 1009 08/30/17 1450 08/30/17 1739  HGB 13.2 13.9  --   --   HCT 39.1 41.0  --   --   PLT 198  --   --   --   HEPARINUNFRC  --   --   --  <0.10*  CREATININE 0.83 0.80  --   --   TROPONINI  --   --  2.60*  --     Estimated Creatinine Clearance: 109.3 mL/min (by C-G formula based on SCr of 0.8 mg/dL).   Assessment: 71 yo male admitted with SOB, no chest pain. EKG with changes, cardiology consulted and recommending a cath once diuresed. Pharmacy consulted to begin IV heparin.  Heparin level is subtherapeutic at <0.1. No bleeding issues or problems with the infusion per RN.   Goal of Therapy:  Heparin level 0.3-0.7 units/ml Monitor platelets by anticoagulation protocol: Yes    Plan:  -Heparin bolus 3000 units IV then increase to 1750 units/hr -6 hr heparin level -Daily heparin level, CBC -Monitor for s/sx of bleeding   Renold Genta, PharmD, BCPS Clinical Pharmacist Phone for today - [place] - [phone] 08/30/2017 7:29 PM

## 2017-08-30 NOTE — Telephone Encounter (Signed)
Pt called with complaints of shortness of breath after waking up this morning; this is a new complaint for the patient; triage RN notes that the pt is extremely winded with exerton per nurse triage recommedation is made for the pt to call 911; pt declines and states that he will try to drive himself to the ED; pt also states that if he gets in "any trouble he will call EMS from his cell phone'; triage nurse informed pt that she will call him back in 30 minutes to check on his status [phone]; will route to [facility] to notify them of this encounter.   Reason for Disposition . Severe difficulty breathing (e.g., struggling for each breath, speaks in single words)  Answer Assessment - Initial Assessment Questions 1. RESPIRATORY STATUS: "Describe your breathing?" (e.g., wheezing, shortness of breath, unable to speak, severe coughing)      Shortness of breath 2. ONSET: "When did this breathing problem begin?"      08/30/17 at 0700 3. PATTERN "Does the difficult breathing come and go, or has it been constant since it started?"      constant 4. SEVERITY: "How bad is your breathing?" (e.g., mild, moderate, severe)    - MILD: No SOB at rest, mild SOB with walking, speaks normally in sentences, can lay down, no retractions, pulse < 100.    - MODERATE: SOB at rest, SOB with minimal exertion and prefers to sit, cannot lie down flat, speaks in phrases, mild retractions, audible wheezing, pulse 100-120.    - SEVERE: Very SOB at rest, speaks in single words, struggling to breathe, sitting hunched forward, retractions, pulse > 120      severe 5. RECURRENT SYMPTOM: "Have you had difficulty breathing before?" If so, ask: "When was the last time?" and "What happened that time?"      no 6. CARDIAC HISTORY: "Do you have any history of heart disease?" (e.g., heart attack, angina, bypass surgery, angioplasty)      No; family history 7. LUNG HISTORY: "Do you have any history of lung disease?"  (e.g., pulmonary  embolus, asthma, emphysema)     no 8. CAUSE: "What do you think is causing the breathing problem?"      unsure 9. OTHER SYMPTOMS: "Do you have any other symptoms? (e.g., dizziness, runny nose, cough, chest pain, fever)     Runny nose for the last month 10. PREGNANCY: "Is there any chance you are pregnant?" "When was your last menstrual period?"       no 11. TRAVEL: "Have you traveled out of the country in the last month?" (e.g., travel history, exposures)       no  Protocols used: BREATHING DIFFICULTY-A-AH

## 2017-08-30 NOTE — Progress Notes (Signed)
CRITICAL VALUE ALERT  Critical Value:  Troponin 2.60  Date & Time Notied:  16:10 08/30/17  Provider Notified: Yes. Cardiology team C notified  Orders Received/Actions taken: Waiting on callback

## 2017-08-30 NOTE — Progress Notes (Signed)
ANTICOAGULATION CONSULT NOTE - Initial Consult  Pharmacy Consult for heparin Indication: chest pain/ACS  Allergies  Allergen Reactions  . Cephalexin Rash  . Levofloxacin Rash    Patient Measurements:   Heparin Dosing Weight: 103 kg  Vital Signs: Temp: 98.6 F (37 C) (12/26 0959) Temp Source: Oral (12/26 0959) BP: 131/85 (12/26 0959)  Labs: No results for input(s): HGB, HCT, PLT, APTT, LABPROT, INR, HEPARINUNFRC, HEPRLOWMOCWT, CREATININE, CKTOTAL, CKMB, TROPONINI in the last 72 hours.  CrCl cannot be calculated (Patient's most recent lab result is older than the maximum 21 days allowed.).   Medical History: Past Medical History:  Diagnosis Date  . Allergy   . 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 Clotting disorder [facility])    November 2014, no longer on blood thinngers  . Depression    on multiple meds. Has been seen at [facility]. and Dr. Sabra Heck is his prescriber  . Diverticulitis of colon 2000 's   treated as an outpatient  . GERD (gastroesophageal reflux disease)    UGI done August '12 - ulcer/. Dr. Ferdinand Lango, gastroenterologist in [facility].   [name] Hx of adenomatous colonic polyps   . Hyperlipidemia    last lipid panel: HDL 44, LDL 117  . Hypertension   . Peptic ulcer    in the past and just recently-August '12  . Pulmonary emboli [facility])    noted October 2014 - treated by Dr. Linda Hedges  . skin cancer    skin CA  . Sleep apnea, primary central    wears CPAP     Assessment: 71 yo male admitted with SOB, no chest pain. EKG with changes, cardiology evaluated at bedside, does not recommend a cardiac cath at this time. Starting heparin infusion while working up patient. CBC wnl, SCr 0.8, K 3.1-replaced in ED.  Goal of Therapy:  Heparin level 0.3-0.7 units/ml Monitor platelets by anticoagulation protocol: Yes    Plan:  -Heparin bolus 4000 units x1 then 1450 units/hr -Daily HL, CBC -Level this afternoon     Hughes Better M 08/30/2017,10:05  AM

## 2017-08-30 NOTE — Progress Notes (Signed)
Responded to Code Stemi  page to support patient. Stemi cancelled.  Chaplain Cowan  at bedside helping to support patient.  Patient ask  that his friend be Michel Bickers be called.  Call was made and Mr. Annette Stable said that Mr. Dupuis has a son and he would call him and inform  him that his father is here. Chaplain will continue support as needed.  Jaclynn Major, [place], [facility], Pager [phone]

## 2017-08-30 NOTE — Progress Notes (Signed)
  Echocardiogram 2D Echocardiogram has been performed.  Johny Chess 08/30/2017, 2:29 PM

## 2017-08-30 NOTE — Telephone Encounter (Signed)
Pt has arrived at ER.

## 2017-08-30 NOTE — Telephone Encounter (Signed)
Attempted to contact pt regarding shortness of breath and going to ED; no answer left message on cell phone [phone].

## 2017-08-30 NOTE — Telephone Encounter (Signed)
Monitor for ER arrival 

## 2017-08-30 NOTE — ED Triage Notes (Signed)
Pt arrived to ED with complaints of SOB when waking up this morning. No chest pain, back pain, arm pain, or neck/jaw pain. On arrival to ED pt found to have ST elevation on EKG. Code stemi activted. Pt in NAD VSS

## 2017-08-30 NOTE — ED Provider Notes (Signed)
[facility] EMERGENCY DEPARTMENT Provider Note   CSN: [id number] Arrival date & time: 08/30/17  2993     History   Chief Complaint Chief Complaint  Patient presents with  . Code STEMI    HPI John Blackburn is a 71 y.o. male.  HPI 71 year old male who presents with shortness of breath. History of HTN, HLD, no prior cardiac history. Strong family history of sudden cardiac death. Reports onset of shortness of breath this morning when he woke up this morning. No chest pain, diaphoresis, nausea, vomiting, syncope, near syncope. No recent illness.  Past Medical History:  Diagnosis Date  . Allergy   . 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 Clotting disorder [facility])    November 2014, no longer on blood thinngers  . Depression    on multiple meds. Has been seen at [facility]. and Dr. Sabra Heck is his prescriber  . Diverticulitis of colon 2000 's   treated as an outpatient  . GERD (gastroesophageal reflux disease)    UGI done August '12 - ulcer/. Dr. Ferdinand Lango, gastroenterologist in [facility].   [name] Hx of adenomatous colonic polyps   . Hyperlipidemia    last lipid panel: HDL 44, LDL 117  . Hypertension   . Peptic ulcer    in the past and just recently-August '12  . Pulmonary emboli [facility])    noted October 2014 - treated by Dr. Linda Hedges  . skin cancer    skin CA  . Sleep apnea, primary central    wears CPAP    Patient Active Problem List   Diagnosis Date Noted  . Elevated blood sugar 01/17/2017  . Incontinence of feces   . Family history of early CAD 08/12/2016  . Family hx of colon cancer 08/12/2016  . Allergic rhinitis 10/15/2013  . Chronic venous insufficiency 07/20/2013  . Acute pulmonary embolism ([place]) 07/08/2013  . ED (erectile dysfunction) 06/01/2011  . Laryngitis, chronic 06/01/2011  . Arthritis   . Hypertension   . Hyperlipidemia   . Obstructive sleep apnea     Past Surgical History:  Procedure Laterality Date  . ANAL  RECTAL MANOMETRY N/A 11/30/2016   Procedure: ANO RECTAL MANOMETRY;  Surgeon: Mauri Pole, MD;  Location: [facility] ENDOSCOPY;  Service: Endoscopy;  Laterality: N/A;  . CHOLECYSTECTOMY  1990   laproscopic   . HERNIA REPAIR    . JOINT REPLACEMENT     knee  . PROSTATE SURGERY     needle biopsy's, TURP  . TKR bilateral  2006   Dr. Violet Baldy  . UPPER GASTROINTESTINAL ENDOSCOPY    . VASECTOMY         Home Medications    Prior to Admission medications   Medication Sig Start Date End Date Taking? Authorizing Provider  atenolol-chlorthalidone (TENORETIC) 50-25 MG tablet TAKE ONE TABLET BY MOUTH ONCE DAILY 07/26/17   Hoyt Koch, MD  buPROPion (WELLBUTRIN XL) 300 MG 24 hr tablet TAKE 1 TABLET BY MOUTH ONCE DAILY 08/23/17   Hoyt Koch, MD  Magnesium 250 MG TABS Take by mouth.    [provider]  Melatonin 1 MG/4ML LIQD Take by mouth at bedtime.    [provider]  Multiple Vitamin (MULTI-VITAMINS) TABS Take by mouth.    [provider]  sertraline (ZOLOFT) 50 MG tablet TAKE ONE TABLET BY MOUTH ONCE DAILY 07/26/17   Hoyt Koch, MD  Turmeric 500 MG CAPS Take by mouth.  [provider]    Family History Family History  Problem Relation Age of Onset  . Stroke Mother   . Hypertension Mother   . Heart disease Mother   . Heart disease Father        CAD/MI-fatal sudden death  . Colon cancer Father   . Colon cancer Sister        colon cancer 20090-survivor  . Heart disease Paternal Uncle   . Colon cancer Paternal Uncle   . Heart disease Paternal Grandfather   . Colon cancer Paternal Aunt   . Colon cancer Cousin   . Colon cancer Cousin   . Colon cancer Paternal Uncle   . Stomach cancer Neg Hx   . Rectal cancer Neg Hx   . Esophageal cancer Neg Hx     Social History Social History   Tobacco Use  . Smoking status: Never Smoker  . Smokeless tobacco: Never Used  Substance Use Topics  . Alcohol use: Yes     Alcohol/week: 0.5 oz    Types: 1 Standard drinks or equivalent per week    Comment: social  . Drug use: No     Allergies   Cephalexin and Levofloxacin   Review of Systems Review of Systems  Constitutional: Negative for fever.  Respiratory: Positive for shortness of breath.   Cardiovascular: Negative for chest pain.  All other systems reviewed and are negative.    Physical Exam Updated Vital Signs BP 131/85 (BP Location: Right Arm)   Temp 98.6 F (37 C) (Oral)   Resp 18   SpO2 96%   Physical Exam Physical Exam  Nursing note and vitals reviewed. Constitutional:  non-toxic, and in no acute distress Head: Normocephalic and atraumatic.  Mouth/Throat: Oropharynx is clear and moist.  Neck: Normal range of motion. Neck supple.  Cardiovascular: Normal rate and regular rhythm.   Pulmonary/Chest: Effort normal and breath sounds normal.  Abdominal: Soft. There is no tenderness. There is no rebound and no guarding.  Musculoskeletal: Normal range of motion. Bilateral pedal edema. No calf tenderness Neurological: Alert, no facial droop, fluent speech, moves all extremities symmetrically Skin: Skin is warm and dry.  Psychiatric: Cooperative   ED Treatments / Results  Labs (all labs ordered are listed, but only abnormal results are displayed) Labs Reviewed  CBC - Abnormal; Notable for the following components:      Result Value   RBC 3.94 (*)    All other components within normal limits  I-STAT CHEM 8, ED - Abnormal; Notable for the following components:   Potassium 3.1 (*)    Chloride 98 (*)    Glucose, Bld 133 (*)    Calcium, Ion 1.10 (*)    All other components within normal limits  BASIC METABOLIC PANEL  BRAIN NATRIURETIC PEPTIDE  I-STAT TROPONIN, ED    EKG  EKG Interpretation  Date/Time:  Wednesday August 30 2017 09:47:39 EST Ventricular Rate:  83 PR Interval:  174 QRS Duration: 100 QT Interval:  372 QTC Calculation: 437 R Axis:   -27 Text  Interpretation:  Critical Test Result: STEMI Normal sinus rhythm Minimal voltage criteria for LVH, may be normal variant Inferior-posterior infarct , possibly acute ACUTE MI / STEMI  Consider right ventricular involvement in acute inferior infarct Abnormal ECG new inferior stemi  Confirmed by Brantley Stage [phone]) on 08/30/2017 10:15:01 AM       Radiology No results found.  Procedures Procedures (including critical care time) CRITICAL CARE Performed by: Forde Dandy   Total  critical care time: 31 minutes  Critical care time was exclusive of separately billable procedures and treating other patients.  Critical care was necessary to treat or prevent imminent or life-threatening deterioration.  Critical care was time spent personally by me on the following activities: development of treatment plan with patient and/or surrogate as well as nursing, discussions with consultants, evaluation of patient's response to treatment, examination of patient, obtaining history from patient or surrogate, ordering and performing treatments and interventions, ordering and review of laboratory studies, ordering and review of radiographic studies, pulse oximetry and re-evaluation of patient's condition.  Medications Ordered in ED Medications  heparin ADULT infusion 100 units/mL (25000 units/285mL sodium chloride 0.45%) (1,450 Units/hr Intravenous New Bag/Given 08/30/17 1014)  aspirin chewable tablet 324 mg (324 mg Oral Given 08/30/17 1014)  heparin bolus via infusion 4,000 Units (4,000 Units Intravenous Bolus from Bag 08/30/17 1015)     Initial Impression / Assessment and Plan / ED Course  I have reviewed the triage vital signs and the nursing notes.  Pertinent labs & imaging results that were available during my care of the patient were reviewed by me and considered in my medical decision making (see chart for details).     Presents with shortness of breath since waking up without chest pain.  His EKG in  triage was concerning for an inferior STEMI, and significantly change from prior EKG.  Minimally short of breath here in the ED.  A code STEMI was called from triage.  Patient evaluated by Dr. Alvester Chou in the ED.  He does have concerning EKG changes, but is not convinced that this is a an inferior STEMI. Does not recommend catheterization. He will admit to cardiology service for possible CHF work-up and additional cardiac work-up.   Final Clinical Impressions(s) / ED Diagnoses   Final diagnoses:  Shortness of breath    ED Discharge Orders    None       Forde Dandy, MD 08/30/17 1019

## 2017-08-31 DIAGNOSIS — I5031 Acute diastolic (congestive) heart failure: Secondary | ICD-10-CM

## 2017-08-31 LAB — HEPARIN LEVEL (UNFRACTIONATED)
HEPARIN UNFRACTIONATED: 0.34 [IU]/mL (ref 0.30–0.70)
HEPARIN UNFRACTIONATED: 0.26 [IU]/mL — AB (ref 0.30–0.70)

## 2017-08-31 LAB — BASIC METABOLIC PANEL
Anion gap: 12 (ref 5–15)
BUN: 11 mg/dL (ref 6–20)
CO2: 28 mmol/L (ref 22–32)
Calcium: 8.6 mg/dL — ABNORMAL LOW (ref 8.9–10.3)
Chloride: 96 mmol/L — ABNORMAL LOW (ref 101–111)
Creatinine, Ser: 0.74 mg/dL (ref 0.61–1.24)
GFR calc Af Amer: >60 mL/min (ref >60)
GFR calc non Af Amer: >60 mL/min (ref >60)
Glucose, Bld: 108 mg/dL — ABNORMAL HIGH (ref 65–99)
Potassium: 2.9 mmol/L — ABNORMAL LOW (ref 3.5–5.1)
Sodium: 136 mmol/L (ref 135–145)

## 2017-08-31 LAB — CBC
HCT: 37.9 % — ABNORMAL LOW (ref 39.0–52.0)
Hemoglobin: 12.8 g/dL — ABNORMAL LOW (ref 13.0–17.0)
MCH: 33 pg (ref 26.0–34.0)
MCHC: 33.8 g/dL (ref 30.0–36.0)
MCV: 97.7 fL (ref 78.0–100.0)
Platelets: 204 10*3/uL (ref 150–400)
RBC: 3.88 MIL/uL — ABNORMAL LOW (ref 4.22–5.81)
RDW: 13.3 % (ref 11.5–15.5)
WBC: 9.1 10*3/uL (ref 4.0–10.5)

## 2017-08-31 LAB — TROPONIN I: Troponin I: 2.7 ng/mL (ref <0.03)

## 2017-08-31 MED ORDER — ASPIRIN 81 MG PO CHEW
81.0000 mg | CHEWABLE_TABLET | ORAL | Status: AC
  Administered 2017-09-01: 81 mg via ORAL
  Filled 2017-08-31: qty 1

## 2017-08-31 MED ORDER — SODIUM CHLORIDE 0.9% FLUSH: 3.0000 mL | Freq: Two times a day (BID) | INTRAVENOUS | Status: DC

## 2017-08-31 MED ORDER — POTASSIUM CHLORIDE CRYS ER 20 MEQ PO TBCR
40.0000 meq | EXTENDED_RELEASE_TABLET | Freq: Once | ORAL | Status: AC
  Administered 2017-08-31: 40 meq via ORAL
  Filled 2017-08-31: qty 2

## 2017-08-31 MED ORDER — SODIUM CHLORIDE 0.9 % IV SOLN
INTRAVENOUS | Status: DC
  Administered 2017-09-01: 04:00:00 via INTRAVENOUS

## 2017-08-31 MED ORDER — SODIUM CHLORIDE 0.9 % IV SOLN: 250.0000 mL | INTRAVENOUS | Status: DC | PRN

## 2017-08-31 MED ORDER — SODIUM CHLORIDE 0.9% FLUSH: 3.0000 mL | INTRAVENOUS | Status: DC | PRN

## 2017-08-31 NOTE — Progress Notes (Addendum)
[place] for heparin Indication: chest pain/ACS  Allergies  Allergen Reactions  . Cephalexin Rash  . Levofloxacin Rash   Patient Measurements: Height: 6\' 1"  (185.4 cm) Weight: 238 lb 6.4 oz (108.1 kg) IBW/kg (Calculated) : 79.9 Heparin Dosing Weight: 103 kg  Vital Signs: Temp: 98.5 F (36.9 C) (12/26 2158) Temp Source: Oral (12/26 2158) BP: 124/70 (12/26 2158) Pulse Rate: 81 (12/26 2158)  Labs: Recent Labs    08/30/17 0955 08/30/17 1009 08/30/17 1450 08/30/17 1739 08/30/17 2113 08/31/17 0223  HGB 13.2 13.9  --   --   --  12.8*  HCT 39.1 41.0  --   --   --  37.9*  PLT 198  --   --   --   --  204  HEPARINUNFRC  --   --   --  <0.10*  --  0.34  CREATININE 0.83 0.80  --   --   --  0.74  TROPONINI  --   --  2.60*  --  2.75*  --     Estimated Creatinine Clearance: 109.3 mL/min (by C-G formula based on SCr of 0.74 mg/dL).  Assessment: 71 yo male admitted with SOB, elevated troponin, and EKG changes. Pharmacy consulted to begin IV heparin.  Heparin level therapeutic at 0.34. Hgb down slightly from 13.9>12.8, plts stable. No s/s bleeding reported.   Goal of Therapy:  Heparin level 0.3-0.7 units/ml Monitor platelets by anticoagulation protocol: Yes  Plan:  Continue heparin gtt at 1750 units/hr Heparin level in 8 hrs to confirm  Daily heparin level and CBC Monitor for s/s bleeding  Lavonda Jumbo, PharmD Clinical Pharmacist 08/31/17 4:30 AM

## 2017-08-31 NOTE — Progress Notes (Signed)
Progress Note  Patient Name: John Blackburn Date of Encounter: 08/31/2017  Primary Cardiologist: [place] is much improved today. No chest pain.   Inpatient Medications    Scheduled Meds: . aspirin EC  81 mg Oral Daily  . furosemide  40 mg Intravenous Daily  . potassium chloride  40 mEq Oral Once  . potassium chloride  40 mEq Oral Once  . sodium chloride flush  3 mL Intravenous Q12H   Continuous Infusions: . sodium chloride    . heparin 1,750 Units/hr (08/31/17 0120)   PRN Meds: sodium chloride, acetaminophen, ondansetron (ZOFRAN) IV, sodium chloride flush   Vital Signs    Vitals:   08/30/17 1144 08/30/17 2158 08/31/17 0530 08/31/17 0837  BP: 128/80 124/70 127/76 125/71  Pulse: 81 81 81 76  Resp: 20 17 20 18   Temp: 97.9 F (36.6 C) 98.5 F (36.9 C) 98.9 F (37.2 C) 99.5 F (37.5 C)  TempSrc: Oral Oral Oral Oral  SpO2: 97% 96% 93% 96%  Weight: 238 lb 6.4 oz (108.1 kg)  236 lb 3.2 oz (107.1 kg)   Height: 6\' 1"  (1.854 m)       Intake/Output Summary (Last 24 hours) at 08/31/2017 1024 Last data filed at 08/31/2017 0943 Gross per 24 hour  Intake 932.42 ml  Output 1925 ml  Net -992.58 ml   Filed Weights   08/30/17 1112 08/30/17 1144 08/31/17 0530  Weight: 248 lb (112.5 kg) 238 lb 6.4 oz (108.1 kg) 236 lb 3.2 oz (107.1 kg)    Telemetry    SR - Personally Reviewed  Physical Exam   General: Well developed, well nourished, male appearing in no acute distress. Head: Normocephalic, atraumatic.  Neck: Supple without bruits, mild JVD. Lungs:  Resp regular and unlabored, CTA. Heart: RRR, S1, S2, no S3, S4, or murmur; no rub. Abdomen: Soft, non-tender, non-distended with normoactive bowel sounds. Extremities: No clubbing, cyanosis, 1+ bilateral LE edema. Distal pedal pulses are 2+ bilaterally. Neuro: Alert and oriented X 3. Moves all extremities spontaneously. Psych: Normal affect.  Labs    Chemistry Recent Labs  Lab  08/30/17 0955 08/30/17 1009 08/31/17 0223  NA 137 140 136  K 3.0* 3.1* 2.9*  CL 99* 98* 96*  CO2 27  --  28  GLUCOSE 136* 133* 108*  BUN 10 11 11   CREATININE 0.83 0.80 0.74  CALCIUM 8.7*  --  8.6*  GFRNONAA >60  --  >60  GFRAA >60  --  >60  ANIONGAP 11  --  12     Hematology Recent Labs  Lab 08/30/17 0955 08/30/17 1009 08/31/17 0223  WBC 8.6  --  9.1  RBC 3.94*  --  3.88*  HGB 13.2 13.9 12.8*  HCT 39.1 41.0 37.9*  MCV 99.2  --  97.7  MCH 33.5  --  33.0  MCHC 33.8  --  33.8  RDW 13.6  --  13.3  PLT 198  --  204    Cardiac Enzymes Recent Labs  Lab 08/30/17 1450 08/30/17 2113 08/31/17 0223  TROPONINI 2.60* 2.75* 2.70*    Recent Labs  Lab 08/30/17 1007  TROPIPOC 2.74*     BNP Recent Labs  Lab 08/30/17 1004  BNP 349.2*     DDimer No results for input(s): DDIMER in the last 168 hours.    Radiology    Dg Chest Portable 1 View  Result Date: 08/30/2017 CLINICAL DATA:  Shortness of Breath EXAM: PORTABLE CHEST 1 VIEW COMPARISON:  September 23, 2016 FINDINGS: There is a small calcified granuloma in the right base. There is no edema or consolidation. Heart size and pulmonary vascularity are normal. No adenopathy. Bones are osteoporotic. There is a focus of bony bridging anteriorly between the right fifth and sixth ribs. IMPRESSION: Small granuloma right base. No edema or consolidation. Stable cardiac silhouette. Electronically Signed   By: Lowella Grip III M.D.   On: 08/30/2017 10:30    Cardiac Studies   TTE: 08/30/17  Study Conclusions  - Left ventricle: The cavity size was mildly dilated. Wall   thickness was increased in a pattern of mild LVH. Systolic   function was normal. The estimated ejection fraction was in the   range of 55% to 60%. Wall motion was normal; there were no   regional wall motion abnormalities. Doppler parameters are   consistent with abnormal left ventricular relaxation (grade 1   diastolic dysfunction). - Aortic valve:  There was trivial regurgitation. - Aortic root: The aortic root was mildly dilated. - Mitral valve: There was mild regurgitation. - Pulmonary arteries: Systolic pressure was moderately increased.   PA peak pressure: 49 mm Hg (S).  Impressions:  - Normal LV systolic function; mild LVH and LVE; mild diastolic   dysfunction; trace AI; mildly dilated aortic root; mild MR; mild   TR; moderately elevated pulmonary pressure.  Patient Profile     71 y.o. male with no prior cardiac disease, however underwent a Myoview stress test that was negative in 2014. His other history includes hypertension, hyperlipidemia, OSA with CPAP use, post-surgical pulmonary embolism (2014), and BPH with elevated PSA who presented to the ED on 08/30/2017 with increasing shortness of breath, elevated troponin levels, and an abnormal EKG.  Assessment & Plan    1. Elevated troponin level: -Cycle cardiac enzymes: 2.74>>2.6>>2.75>>2.70, suspect this could be 2/2 to demand ischemia but does have significant family hx with sudden cardiac death. Will continue IV heparin.  - no chest pain. Will need R/LHC, possibly tomorrow pending labs and respiratory status.   2. Acute CHF: echo showed normal EF with G1DD with moderately elevated pulmonary pressure.  -- 1.6 L UOP yesterday, will given additional IV lasix today as he is still volume overloaded on exam -- weight 248>>236  3. Hypertension : Stable  4. Hyperlipidemia: -Does not appear to be on lipid lowering agent -Will check lipid profile  5. Hypokalemia:  -K+ 2.9 repleted -Follow with BMET in AM  Signed, Reino Bellis, NP  08/31/2017, 10:24 AM  Pager # [phone]   For questions or updates, please contact [place] Please consult [email or web address] for contact info under Cardiology/STEMI.   Agree with note by Reino Bellis NP-C  John Blackburn is clinically improved.  He has diuresed about a liter and a half on IV Lasix.  His troponins were low.  He  remains in sinus rhythm.  He denies chest pain.  His lower extremity edema has improved as well.  His exam is benign.  His BNP is relatively low in the 300 range.  We will plan on performing right and left heart cath tomorrow. The patient understands that risks included but are not limited to stroke (1 in 1000), death (1 in 8), kidney failure [usually temporary] (1 in 500), bleeding (1 in 200), allergic reaction [possibly serious] (1 in 200). The patient understands and agrees to proceed  Lorretta Harp, M.D., [place], [facility], [place], [place] [address] [place], [place]  [address]  [phone] 08/31/2017 10:50 AM

## 2017-08-31 NOTE — H&P (View-Only) (Signed)
Progress Note  Patient Name: John Blackburn Date of Encounter: 08/31/2017  Primary Cardiologist: Pomeroy is much improved today. No chest pain.   Inpatient Medications    Scheduled Meds: . aspirin EC  81 mg Oral Daily  . furosemide  40 mg Intravenous Daily  . potassium chloride  40 mEq Oral Once  . potassium chloride  40 mEq Oral Once  . sodium chloride flush  3 mL Intravenous Q12H   Continuous Infusions: . sodium chloride    . heparin 1,750 Units/hr (08/31/17 0120)   PRN Meds: sodium chloride, acetaminophen, ondansetron (ZOFRAN) IV, sodium chloride flush   Vital Signs    Vitals:   08/30/17 1144 08/30/17 2158 08/31/17 0530 08/31/17 0837  BP: 128/80 124/70 127/76 125/71  Pulse: 81 81 81 76  Resp: 20 17 20 18   Temp: 97.9 F (36.6 C) 98.5 F (36.9 C) 98.9 F (37.2 C) 99.5 F (37.5 C)  TempSrc: Oral Oral Oral Oral  SpO2: 97% 96% 93% 96%  Weight: 238 lb 6.4 oz (108.1 kg)  236 lb 3.2 oz (107.1 kg)   Height: 6\' 1"  (1.854 m)       Intake/Output Summary (Last 24 hours) at 08/31/2017 1024 Last data filed at 08/31/2017 0943 Gross per 24 hour  Intake 932.42 ml  Output 1925 ml  Net -992.58 ml   Filed Weights   08/30/17 1112 08/30/17 1144 08/31/17 0530  Weight: 248 lb (112.5 kg) 238 lb 6.4 oz (108.1 kg) 236 lb 3.2 oz (107.1 kg)    Telemetry    SR - Personally Reviewed  Physical Exam   General: Well developed, well nourished, male appearing in no acute distress. Head: Normocephalic, atraumatic.  Neck: Supple without bruits, mild JVD. Lungs:  Resp regular and unlabored, CTA. Heart: RRR, S1, S2, no S3, S4, or murmur; no rub. Abdomen: Soft, non-tender, non-distended with normoactive bowel sounds. Extremities: No clubbing, cyanosis, 1+ bilateral LE edema. Distal pedal pulses are 2+ bilaterally. Neuro: Alert and oriented X 3. Moves all extremities spontaneously. Psych: Normal affect.  Labs    Chemistry Recent Labs  Lab  08/30/17 0955 08/30/17 1009 08/31/17 0223  NA 137 140 136  K 3.0* 3.1* 2.9*  CL 99* 98* 96*  CO2 27  --  28  GLUCOSE 136* 133* 108*  BUN 10 11 11   CREATININE 0.83 0.80 0.74  CALCIUM 8.7*  --  8.6*  GFRNONAA >60  --  >60  GFRAA >60  --  >60  ANIONGAP 11  --  12     Hematology Recent Labs  Lab 08/30/17 0955 08/30/17 1009 08/31/17 0223  WBC 8.6  --  9.1  RBC 3.94*  --  3.88*  HGB 13.2 13.9 12.8*  HCT 39.1 41.0 37.9*  MCV 99.2  --  97.7  MCH 33.5  --  33.0  MCHC 33.8  --  33.8  RDW 13.6  --  13.3  PLT 198  --  204    Cardiac Enzymes Recent Labs  Lab 08/30/17 1450 08/30/17 2113 08/31/17 0223  TROPONINI 2.60* 2.75* 2.70*    Recent Labs  Lab 08/30/17 1007  TROPIPOC 2.74*     BNP Recent Labs  Lab 08/30/17 1004  BNP 349.2*     DDimer No results for input(s): DDIMER in the last 168 hours.    Radiology    Dg Chest Portable 1 View  Result Date: 08/30/2017 CLINICAL DATA:  Shortness of Breath EXAM: PORTABLE CHEST 1 VIEW COMPARISON:  September 23, 2016 FINDINGS: There is a small calcified granuloma in the right base. There is no edema or consolidation. Heart size and pulmonary vascularity are normal. No adenopathy. Bones are osteoporotic. There is a focus of bony bridging anteriorly between the right fifth and sixth ribs. IMPRESSION: Small granuloma right base. No edema or consolidation. Stable cardiac silhouette. Electronically Signed   By: Lowella Grip III M.D.   On: 08/30/2017 10:30    Cardiac Studies   TTE: 08/30/17  Study Conclusions  - Left ventricle: The cavity size was mildly dilated. Wall   thickness was increased in a pattern of mild LVH. Systolic   function was normal. The estimated ejection fraction was in the   range of 55% to 60%. Wall motion was normal; there were no   regional wall motion abnormalities. Doppler parameters are   consistent with abnormal left ventricular relaxation (grade 1   diastolic dysfunction). - Aortic valve:  There was trivial regurgitation. - Aortic root: The aortic root was mildly dilated. - Mitral valve: There was mild regurgitation. - Pulmonary arteries: Systolic pressure was moderately increased.   PA peak pressure: 49 mm Hg (S).  Impressions:  - Normal LV systolic function; mild LVH and LVE; mild diastolic   dysfunction; trace AI; mildly dilated aortic root; mild MR; mild   TR; moderately elevated pulmonary pressure.  Patient Profile     71 y.o. male with no prior cardiac disease, however underwent a Myoview stress test that was negative in 2014. His other history includes hypertension, hyperlipidemia, OSA with CPAP use, post-surgical pulmonary embolism (2014), and BPH with elevated PSA who presented to the ED on 08/30/2017 with increasing shortness of breath, elevated troponin levels, and an abnormal EKG.  Assessment & Plan    1. Elevated troponin level: -Cycle cardiac enzymes: 2.74>>2.6>>2.75>>2.70, suspect this could be 2/2 to demand ischemia but does have significant family hx with sudden cardiac death. Will continue IV heparin.  - no chest pain. Will need R/LHC, possibly tomorrow pending labs and respiratory status.   2. Acute CHF: echo showed normal EF with G1DD with moderately elevated pulmonary pressure.  -- 1.6 L UOP yesterday, will given additional IV lasix today as he is still volume overloaded on exam -- weight 248>>236  3. Hypertension : Stable  4. Hyperlipidemia: -Does not appear to be on lipid lowering agent -Will check lipid profile  5. Hypokalemia:  -K+ 2.9 repleted -Follow with BMET in AM  Signed, Reino Bellis, NP  08/31/2017, 10:24 AM  Pager # [phone]   For questions or updates, please contact [place] Please consult [email or web address] for contact info under Cardiology/STEMI.   Agree with note by Reino Bellis NP-C  Mr. Mceachron is clinically improved.  He has diuresed about a liter and a half on IV Lasix.  His troponins were low.  He  remains in sinus rhythm.  He denies chest pain.  His lower extremity edema has improved as well.  His exam is benign.  His BNP is relatively low in the 300 range.  We will plan on performing right and left heart cath tomorrow. The patient understands that risks included but are not limited to stroke (1 in 1000), death (1 in 21), kidney failure [usually temporary] (1 in 500), bleeding (1 in 200), allergic reaction [possibly serious] (1 in 200). The patient understands and agrees to proceed  Lorretta Harp, M.D., [place], [facility] [place], [place], [place] [address] [place],   [id number]  [phone] 08/31/2017 10:50 AM

## 2017-08-31 NOTE — Progress Notes (Signed)
Night CHG bath given

## 2017-08-31 NOTE — Progress Notes (Signed)
Heart Failure Navigator Consult Note  Presentation: per Dr. Gwenlyn Found: John Blackburn is a 71yo M with no prior cardiac disease, however underwent a Myoview stress test that was negative in 2014. His other history includes hypertension, hyperlipidemia, OSA with CPAP use, post-surgical pulmonary embolism (2014), and BPH with elevated PSA who presented to the ED on 08/30/2017 with increasing shortness of breath, elevated troponin levels, and an abnormal EKG.  Past Medical History:  Diagnosis Date  . Allergy   . 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 Clotting disorder [facility])    November 2014, no longer on blood thinngers  . Depression    on multiple meds. Has been seen at [facility]. and Dr. Sabra Heck is his prescriber  . Diverticulitis of colon 2000 's   treated as an outpatient  . GERD (gastroesophageal reflux disease)    UGI done August '12 - ulcer/. Dr. Ferdinand Lango, gastroenterologist in [facility].   [name] Hx of adenomatous colonic polyps   . Hyperlipidemia    last lipid panel: HDL 44, LDL 117  . Hypertension   . Peptic ulcer    in the past and just recently-August '12  . Pulmonary emboli [facility])    noted October 2014 - treated by Dr. Linda Hedges  . skin cancer    skin CA  . Sleep apnea, primary central    wears CPAP    Social History   Socioeconomic History  . Marital status: Divorced    Spouse name: None  . Number of children: 3  . Years of education: 74  . Highest education level: None  Social Needs  . Financial resource strain: None  . Food insecurity - worry: None  . Food insecurity - inability: None  . Transportation needs - medical: None  . Transportation needs - non-medical: None  Occupational History  . Occupation: Chief Strategy Officer- retired  Tobacco Use  . Smoking status: Never Smoker  . Smokeless tobacco: Never Used  Substance and Sexual Activity  . Alcohol use: Yes    Alcohol/week: 0.5 oz    Types: 1 Standard drinks or equivalent per week    Comment: social   . Drug use: No  . Sexual activity: Not Currently    Partners: Female  Other Topics Concern  . None  Social History Narrative   HS[employer], [employer] -BS business admin. Married '71- 34 yrs/divorced. 3 sons - '74, '76, '87.  1 grandchild.   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 No pets. Serially monogamous.   ACP - he has a living will: DNR, DNI.    ECHO:Study Conclusions--08/30/17  - Left ventricle: The cavity size was mildly dilated. Wall   thickness was increased in a pattern of mild LVH. Systolic   function was normal. The estimated ejection fraction was in the   range of 55% to 60%. Wall motion was normal; there were no   regional wall motion abnormalities. Doppler parameters are   consistent with abnormal left ventricular relaxation (grade 1   diastolic dysfunction). - Aortic valve: There was trivial regurgitation. - Aortic root: The aortic root was mildly dilated. - Mitral valve: There was mild regurgitation. - Pulmonary arteries: Systolic pressure was moderately increased.   PA peak pressure: 49 mm Hg (S).  Impressions:  - Normal LV systolic function; mild LVH and LVE; mild diastolic   dysfunction; trace AI; mildly dilated aortic root; mild MR; mild   TR; moderately elevated pulmonary pressure.  BNP    Component  Value Date/Time   BNP 349.2 (H) 08/30/2017 1004    ProBNP    Component Value Date/Time   PROBNP 34.0 08/11/2016 1039     Education Assessment and Provision:  Detailed education and instructions provided on heart failure disease management including the following:  Signs and symptoms of Heart Failure When to call the physician Importance of daily weights Low sodium diet Fluid restriction Medication management Anticipated future follow-up appointments  Patient education given on each of the above topics.  Patient acknowledges understanding and acceptance of all instructions.  I spoke with Mr. Reece regarding his current  hospitalization and New HF diagnosis.  I reviewed the importance of daily weights and when to contact the physician.  We also discussed a low sodium diet and high sodium foods to avoid.  He admits that he eats out often and does not cook secondary to living alone.  I encouraged that he pay attention to sodium content in foods that he chooses going forward.  He denies any issues getting or taking prescribed medications. He lives alone and has a son that is local for support.   He did exhibit some signs of mild confusion as we spoke.  (Asked to return to his room and said he had not yet seen Dr. Gwenlyn Found since being hospitalized).  He will follow with [facility].  Education Materials:  "Living Better With Heart Failure" Booklet, Daily Weight Tracker Tool    High Risk Criteria for Readmission and/or Poor Patient Outcomes:  EF <30%- No 55-60% with grade 1 dias dys.  2 or more admissions in 6 months- No--New HF  Difficult social situation- No  Demonstrates medication noncompliance- denies   Barriers of Care:  New HF, Knowledge and compliance  Discharge Planning:   Plans to return to home alone.

## 2017-08-31 NOTE — Progress Notes (Signed)
[place] for heparin Indication: chest pain/ACS  Allergies  Allergen Reactions  . Cephalexin Rash  . Levofloxacin Rash   Patient Measurements: Height: 6\' 1"  (185.4 cm) Weight: 236 lb 3.2 oz (107.1 kg) IBW/kg (Calculated) : 79.9 Heparin Dosing Weight: 103 kg  Vital Signs: Temp: 98 F (36.7 C) (12/27 1204) Temp Source: Oral (12/27 1204) BP: 113/71 (12/27 1204) Pulse Rate: 76 (12/27 1204)  Labs: Recent Labs    08/30/17 0955 08/30/17 1009 08/30/17 1450 08/30/17 1739 08/30/17 2113 08/31/17 0223 08/31/17 1148  HGB 13.2 13.9  --   --   --  12.8*  --   HCT 39.1 41.0  --   --   --  37.9*  --   PLT 198  --   --   --   --  204  --   HEPARINUNFRC  --   --   --  <0.10*  --  0.34 0.26*  CREATININE 0.83 0.80  --   --   --  0.74  --   TROPONINI  --   --  2.60*  --  2.75* 2.70*  --     Estimated Creatinine Clearance: 108.8 mL/min (by C-G formula based on SCr of 0.74 mg/dL).  Assessment: 71 yo male admitted with SOB, elevated troponin, and EKG changes. Pharmacy consulted to begin IV heparin. Plans noted for RHC/LHC on 12/28 -heparin level is 0.26 and below goal   Goal of Therapy:  Heparin level 0.3-0.7 units/ml Monitor platelets by anticoagulation protocol: Yes  Plan:  Increase heparin to 1970 units/hr Daily heparin level and CBC  Hildred Laser, Pharm D 08/31/2017 1:12 PM

## 2017-08-31 NOTE — Progress Notes (Signed)
Patient was using bathroom when I arrived. Nurse had been helping him and was in the room too. Patient came and I introduced myself. We talked shortly and held hands and prayed together. I will go back to revisit in the course of the day. Maira Christon a [place], Chaplain    08/31/17 1100  Clinical Encounter Type  Visited With Patient  Visit Type Initial  Referral From Chaplain  Consult/Referral To Chaplain  Spiritual Encounters  Spiritual Needs Prayer  Stress Factors  Patient Stress Factors Exhausted  Family Stress Factors None identified

## 2017-09-01 ENCOUNTER — Encounter (HOSPITAL_COMMUNITY)
Admission: EM | Disposition: A | Discharge status: Home Health | Payer: Self-pay | Source: Home / Self Care | Attending: Cardiovascular Disease

## 2017-09-01 ENCOUNTER — Encounter (HOSPITAL_COMMUNITY): Payer: Self-pay | Admitting: Interventional Cardiology

## 2017-09-01 DIAGNOSIS — I214 Non-ST elevation (NSTEMI) myocardial infarction: Secondary | ICD-10-CM | POA: Diagnosis present

## 2017-09-01 DIAGNOSIS — R0602 Shortness of breath: Secondary | ICD-10-CM | POA: Insufficient documentation

## 2017-09-01 HISTORY — PX: RIGHT/LEFT HEART CATH AND CORONARY ANGIOGRAPHY: CATH118266

## 2017-09-01 HISTORY — PX: CORONARY STENT INTERVENTION: CATH118234

## 2017-09-01 LAB — POCT I-STAT 3, VENOUS BLOOD GAS (G3P V)
Acid-Base Excess: 1 mmol/L (ref 0.0–2.0)
Bicarbonate: 26.1 mmol/L (ref 20.0–28.0)
O2 Saturation: 71 %
PCO2 VEN: 43.8 mmHg — AB (ref 44.0–60.0)
PH VEN: 7.383 (ref 7.250–7.430)
TCO2: 27 mmol/L (ref 22–32)
pO2, Ven: 38 mmHg (ref 32.0–45.0)

## 2017-09-01 LAB — BASIC METABOLIC PANEL
ANION GAP: 11 (ref 5–15)
BUN: 10 mg/dL (ref 6–20)
CHLORIDE: 99 mmol/L — AB (ref 101–111)
CO2: 26 mmol/L (ref 22–32)
Calcium: 8.8 mg/dL — ABNORMAL LOW (ref 8.9–10.3)
Creatinine, Ser: 0.84 mg/dL (ref 0.61–1.24)
GFR calc Af Amer: >60 mL/min (ref >60)
GFR calc non Af Amer: >60 mL/min (ref >60)
GLUCOSE: 105 mg/dL — AB (ref 65–99)
POTASSIUM: 3.5 mmol/L (ref 3.5–5.1)
Sodium: 136 mmol/L (ref 135–145)

## 2017-09-01 LAB — PROTIME-INR
INR: 1.09
Prothrombin Time: 14 seconds (ref 11.4–15.2)

## 2017-09-01 LAB — POCT I-STAT 3, ART BLOOD GAS (G3+)
Bicarbonate: 25.2 mmol/L (ref 20.0–28.0)
O2 SAT: 97 %
PCO2 ART: 41.5 mmHg (ref 32.0–48.0)
PH ART: 7.392 (ref 7.350–7.450)
PO2 ART: 89 mmHg (ref 83.0–108.0)
TCO2: 26 mmol/L (ref 22–32)

## 2017-09-01 LAB — LIPID PANEL
Cholesterol: 166 mg/dL (ref 0–200)
HDL: 30 mg/dL — ABNORMAL LOW (ref >40)
LDL CALC: 114 mg/dL — AB (ref 0–99)
TRIGLYCERIDES: 109 mg/dL (ref <150)
Total CHOL/HDL Ratio: 5.5 RATIO
VLDL: 22 mg/dL (ref 0–40)

## 2017-09-01 LAB — CBC
HEMATOCRIT: 40 % (ref 39.0–52.0)
HEMOGLOBIN: 13.3 g/dL (ref 13.0–17.0)
MCH: 32.9 pg (ref 26.0–34.0)
MCHC: 33.3 g/dL (ref 30.0–36.0)
MCV: 99 fL (ref 78.0–100.0)
Platelets: 242 10*3/uL (ref 150–400)
RBC: 4.04 MIL/uL — ABNORMAL LOW (ref 4.22–5.81)
RDW: 13.5 % (ref 11.5–15.5)
WBC: 7.8 10*3/uL (ref 4.0–10.5)

## 2017-09-01 LAB — HEPARIN LEVEL (UNFRACTIONATED): Heparin Unfractionated: 0.41 IU/mL (ref 0.30–0.70)

## 2017-09-01 LAB — POCT ACTIVATED CLOTTING TIME
ACTIVATED CLOTTING TIME: 252 s
ACTIVATED CLOTTING TIME: 257 s
Activated Clotting Time: 246 seconds

## 2017-09-01 SURGERY — RIGHT/LEFT HEART CATH AND CORONARY ANGIOGRAPHY
Anesthesia: LOCAL

## 2017-09-01 MED ORDER — IOPAMIDOL (ISOVUE-370) INJECTION 76%
INTRAVENOUS | Status: DC | PRN
  Administered 2017-09-01: 130 mL via INTRA_ARTERIAL

## 2017-09-01 MED ORDER — FENTANYL CITRATE (PF) 100 MCG/2ML IJ SOLN
INTRAMUSCULAR | Status: AC
Start: 2017-09-01 — End: 2017-09-01
  Filled 2017-09-01: qty 2

## 2017-09-01 MED ORDER — LIDOCAINE HCL (PF) 1 % IJ SOLN
INTRAMUSCULAR | Status: DC | PRN
Start: 2017-09-01 — End: 2017-09-01
  Administered 2017-09-01 (×2): 2 mL via SUBCUTANEOUS

## 2017-09-01 MED ORDER — LABETALOL HCL 5 MG/ML IV SOLN: 10.0000 mg | INTRAVENOUS | Status: AC | PRN

## 2017-09-01 MED ORDER — ACETAMINOPHEN 325 MG PO TABS
650.0000 mg | ORAL_TABLET | ORAL | Status: DC | PRN
  Administered 2017-09-02: 650 mg via ORAL
  Filled 2017-09-01: qty 2

## 2017-09-01 MED ORDER — HEPARIN SODIUM (PORCINE) 1000 UNIT/ML IJ SOLN
INTRAMUSCULAR | Status: DC | PRN
  Administered 2017-09-01 (×2): 3000 [IU] via INTRAVENOUS
  Administered 2017-09-01 (×2): 5000 [IU] via INTRAVENOUS

## 2017-09-01 MED ORDER — IOPAMIDOL (ISOVUE-370) INJECTION 76%
INTRAVENOUS | Status: AC
  Filled 2017-09-01: qty 100

## 2017-09-01 MED ORDER — SODIUM CHLORIDE 0.9% FLUSH: 3.0000 mL | INTRAVENOUS | Status: DC | PRN

## 2017-09-01 MED ORDER — TIROFIBAN HCL IN NACL 5-0.9 MG/100ML-% IV SOLN
INTRAVENOUS | Status: AC | PRN
  Administered 2017-09-01: 0.15 ug/kg/min via INTRAVENOUS

## 2017-09-01 MED ORDER — MIDAZOLAM HCL 2 MG/2ML IJ SOLN
INTRAMUSCULAR | Status: AC
  Filled 2017-09-01: qty 2

## 2017-09-01 MED ORDER — TICAGRELOR 90 MG PO TABS
90.0000 mg | ORAL_TABLET | Freq: Two times a day (BID) | ORAL | Status: DC
  Administered 2017-09-01 – 2017-09-03 (×4): 90 mg via ORAL
  Filled 2017-09-01 (×4): qty 1

## 2017-09-01 MED ORDER — SERTRALINE HCL 50 MG PO TABS
50.0000 mg | ORAL_TABLET | Freq: Every day | ORAL | Status: DC
Start: 2017-09-01 — End: 2017-09-04
  Administered 2017-09-01 – 2017-09-04 (×4): 50 mg via ORAL
  Filled 2017-09-01 (×4): qty 1

## 2017-09-01 MED ORDER — FENTANYL CITRATE (PF) 100 MCG/2ML IJ SOLN
INTRAMUSCULAR | Status: DC | PRN
  Administered 2017-09-01 (×2): 25 ug via INTRAVENOUS

## 2017-09-01 MED ORDER — VERAPAMIL HCL 2.5 MG/ML IV SOLN
INTRAVENOUS | Status: DC | PRN
  Administered 2017-09-01: 10 mL via INTRA_ARTERIAL

## 2017-09-01 MED ORDER — HEPARIN (PORCINE) IN NACL 2-0.9 UNIT/ML-% IJ SOLN
INTRAMUSCULAR | Status: AC
  Filled 2017-09-01: qty 1000

## 2017-09-01 MED ORDER — ASPIRIN 81 MG PO CHEW: 81.0000 mg | CHEWABLE_TABLET | Freq: Every day | ORAL | Status: DC

## 2017-09-01 MED ORDER — HYDRALAZINE HCL 20 MG/ML IJ SOLN: 5.0000 mg | INTRAMUSCULAR | Status: AC | PRN

## 2017-09-01 MED ORDER — IOPAMIDOL (ISOVUE-370) INJECTION 76%
INTRAVENOUS | Status: AC
  Filled 2017-09-01: qty 50

## 2017-09-01 MED ORDER — NITROGLYCERIN 1 MG/10 ML FOR IR/CATH LAB
INTRA_ARTERIAL | Status: DC | PRN
  Administered 2017-09-01: 200 ug via INTRACORONARY
  Administered 2017-09-01: 400 ug via INTRA_ARTERIAL

## 2017-09-01 MED ORDER — MIDAZOLAM HCL 2 MG/2ML IJ SOLN
INTRAMUSCULAR | Status: DC | PRN
  Administered 2017-09-01: 1 mg via INTRAVENOUS
  Administered 2017-09-01: 2 mg via INTRAVENOUS

## 2017-09-01 MED ORDER — BUPROPION HCL ER (XL) 300 MG PO TB24
300.0000 mg | ORAL_TABLET | Freq: Every day | ORAL | Status: DC
  Administered 2017-09-01 – 2017-09-04 (×4): 300 mg via ORAL
  Filled 2017-09-01 (×4): qty 1

## 2017-09-01 MED ORDER — HEPARIN SODIUM (PORCINE) 1000 UNIT/ML IJ SOLN
INTRAMUSCULAR | Status: AC
  Filled 2017-09-01: qty 1

## 2017-09-01 MED ORDER — SODIUM CHLORIDE 0.9 % IV SOLN
INTRAVENOUS | Status: AC | PRN
  Administered 2017-09-01: 10 mL/h via INTRAVENOUS

## 2017-09-01 MED ORDER — CHLORTHALIDONE 25 MG PO TABS
25.0000 mg | ORAL_TABLET | Freq: Every day | ORAL | Status: DC
  Administered 2017-09-01 – 2017-09-04 (×4): 25 mg via ORAL
  Filled 2017-09-01 (×4): qty 1

## 2017-09-01 MED ORDER — TIROFIBAN HCL IN NACL 5-0.9 MG/100ML-% IV SOLN
INTRAVENOUS | Status: AC
  Filled 2017-09-01: qty 100

## 2017-09-01 MED ORDER — SODIUM CHLORIDE 0.9 % IV SOLN: INTRAVENOUS | Status: AC

## 2017-09-01 MED ORDER — ATENOLOL-CHLORTHALIDONE 50-25 MG PO TABS: 1.0000 | ORAL_TABLET | Freq: Every day | ORAL | Status: DC

## 2017-09-01 MED ORDER — ONDANSETRON HCL 4 MG/2ML IJ SOLN: 4.0000 mg | Freq: Four times a day (QID) | INTRAMUSCULAR | Status: DC | PRN

## 2017-09-01 MED ORDER — SODIUM CHLORIDE 0.9% FLUSH
3.0000 mL | Freq: Two times a day (BID) | INTRAVENOUS | Status: DC
  Administered 2017-09-01: 17:00:00 3 mL via INTRAVENOUS

## 2017-09-01 MED ORDER — TIROFIBAN (AGGRASTAT) BOLUS VIA INFUSION
INTRAVENOUS | Status: DC | PRN
  Administered 2017-09-01: 2625 ug via INTRAVENOUS

## 2017-09-01 MED ORDER — TICAGRELOR 90 MG PO TABS
ORAL_TABLET | ORAL | Status: DC | PRN
  Administered 2017-09-01: 180 mg via ORAL

## 2017-09-01 MED ORDER — SODIUM CHLORIDE 0.9 % IV SOLN: 250.0000 mL | INTRAVENOUS | Status: DC | PRN

## 2017-09-01 MED ORDER — HEPARIN (PORCINE) IN NACL 2-0.9 UNIT/ML-% IJ SOLN
INTRAMUSCULAR | Status: AC | PRN
  Administered 2017-09-01: 1000 mL

## 2017-09-01 MED ORDER — TIROFIBAN HCL IN NACL 5-0.9 MG/100ML-% IV SOLN
0.1500 ug/kg/min | INTRAVENOUS | Status: AC
  Filled 2017-09-01: qty 100

## 2017-09-01 MED ORDER — ATENOLOL 25 MG PO TABS
50.0000 mg | ORAL_TABLET | Freq: Every day | ORAL | Status: DC
  Administered 2017-09-01 – 2017-09-04 (×4): 50 mg via ORAL
  Filled 2017-09-01 (×2): qty 1
  Filled 2017-09-01 (×2): qty 2

## 2017-09-01 MED ORDER — VERAPAMIL HCL 2.5 MG/ML IV SOLN
INTRAVENOUS | Status: AC
  Filled 2017-09-01: qty 2

## 2017-09-01 MED ORDER — LIDOCAINE HCL (PF) 1 % IJ SOLN
INTRAMUSCULAR | Status: AC
  Filled 2017-09-01: qty 30

## 2017-09-01 SURGICAL SUPPLY — 21 items
BALLN SAPPHIRE 2.0X12 (BALLOONS) ×2
BALLN SAPPHIRE 2.5X15 (BALLOONS) ×2
BALLN SAPPHIRE ~~LOC~~ 4.0X8 (BALLOONS) ×1 IMPLANT
BALLOON SAPPHIRE 2.0X12 (BALLOONS) IMPLANT
BALLOON SAPPHIRE 2.5X15 (BALLOONS) IMPLANT
CATH 5FR JL3.5 JR4 ANG PIG MP (CATHETERS) ×1 IMPLANT
CATH BALLN WEDGE 5F 110CM (CATHETERS) ×1 IMPLANT
CATH LAUNCHER 6FR JR4 (CATHETERS) ×1 IMPLANT
DEVICE RAD COMP TR BAND LRG (VASCULAR PRODUCTS) ×1 IMPLANT
GLIDESHEATH SLEND SS 6F .021 (SHEATH) ×1 IMPLANT
GUIDEWIRE INQWIRE 1.5J.035X260 (WIRE) IMPLANT
INQWIRE 1.5J .035X260CM (WIRE) ×2
KIT ENCORE 26 ADVANTAGE (KITS) ×1 IMPLANT
KIT HEART LEFT (KITS) ×2 IMPLANT
PACK CARDIAC CATHETERIZATION (CUSTOM PROCEDURE TRAY) ×2 IMPLANT
SHEATH GLIDE SLENDER 4/5FR (SHEATH) ×1 IMPLANT
STENT SYNERGY DES 2.25X16 (Permanent Stent) ×1 IMPLANT
STENT SYNERGY DES 3.5X16 (Permanent Stent) ×1 IMPLANT
TRANSDUCER W/STOPCOCK (MISCELLANEOUS) ×2 IMPLANT
TUBING CIL FLEX 10 FLL-RA (TUBING) ×2 IMPLANT
WIRE ASAHI PROWATER 180CM (WIRE) ×1 IMPLANT

## 2017-09-01 NOTE — Interval H&P Note (Signed)
Cath Lab Visit (complete for each Cath Lab visit)  Clinical Evaluation Leading to the Procedure:   ACS: Yes.    Non-ACS:    Anginal Classification: CCS IV  Anti-ischemic medical therapy: Minimal Therapy (1 class of medications)  Non-Invasive Test Results: No non-invasive testing performed  Prior CABG: No previous CABG      History and Physical Interval Note:  09/01/2017 7:31 AM  John Blackburn  has presented today for surgery, with the diagnosis of cp/chf  The various methods of treatment have been discussed with the patient and family. After consideration of risks, benefits and other options for treatment, the patient has consented to  Procedure(s): RIGHT/LEFT HEART CATH AND CORONARY ANGIOGRAPHY (N/A) as a surgical intervention .  The patient's history has been reviewed, patient examined, no change in status, stable for surgery.  I have reviewed the patient's chart and labs.  Questions were answered to the patient's satisfaction.     Larae Grooms

## 2017-09-01 NOTE — Progress Notes (Signed)
CPAP set up and patient placed on nasal CPAP, 10.0 cm H20 for HS. Tolerating well at this time. RN aware.

## 2017-09-01 NOTE — Progress Notes (Signed)
Patient to cath lab at [place]. Received notice that patient would not be returning to [facility].

## 2017-09-01 NOTE — Progress Notes (Signed)
Patient had an accidental EKG completed this am that read abnormal. RN looked at previous EKG for the past two days- they read the same thing. RN also found in MD Berry's note that the patient's EKG has been abnormal. Patient denied CP or SOB. Will continue to monitor.

## 2017-09-01 NOTE — Plan of Care (Signed)
  Health Behavior/Discharge Planning: Ability to manage health-related needs will improve 09/01/2017 0749 - Progressing by Lavonna Rua, RN

## 2017-09-02 DIAGNOSIS — Z8659 Personal history of other mental and behavioral disorders: Secondary | ICD-10-CM

## 2017-09-02 DIAGNOSIS — Z8719 Personal history of other diseases of the digestive system: Secondary | ICD-10-CM

## 2017-09-02 DIAGNOSIS — I2510 Atherosclerotic heart disease of native coronary artery without angina pectoris: Secondary | ICD-10-CM

## 2017-09-02 DIAGNOSIS — Z9861 Coronary angioplasty status: Secondary | ICD-10-CM

## 2017-09-02 LAB — CBC
HCT: 41.5 % (ref 39.0–52.0)
Hemoglobin: 13.8 g/dL (ref 13.0–17.0)
MCH: 33.2 pg (ref 26.0–34.0)
MCHC: 33.3 g/dL (ref 30.0–36.0)
MCV: 99.8 fL (ref 78.0–100.0)
Platelets: 296 10*3/uL (ref 150–400)
RBC: 4.16 MIL/uL — ABNORMAL LOW (ref 4.22–5.81)
RDW: 13.7 % (ref 11.5–15.5)
WBC: 11.5 10*3/uL — ABNORMAL HIGH (ref 4.0–10.5)

## 2017-09-02 LAB — URINALYSIS, ROUTINE W REFLEX MICROSCOPIC
Bacteria, UA: NONE SEEN
Bilirubin Urine: NEGATIVE
Glucose, UA: NEGATIVE mg/dL
Ketones, ur: NEGATIVE mg/dL
Leukocytes, UA: NEGATIVE
Nitrite: NEGATIVE
Protein, ur: NEGATIVE mg/dL
Specific Gravity, Urine: 1.018 (ref 1.005–1.030)
Squamous Epithelial / LPF: NONE SEEN
pH: 5 (ref 5.0–8.0)

## 2017-09-02 LAB — BASIC METABOLIC PANEL
Anion gap: 14 (ref 5–15)
BUN: 16 mg/dL (ref 6–20)
CO2: 22 mmol/L (ref 22–32)
Calcium: 9.1 mg/dL (ref 8.9–10.3)
Chloride: 100 mmol/L — ABNORMAL LOW (ref 101–111)
Creatinine, Ser: 0.89 mg/dL (ref 0.61–1.24)
GFR calc Af Amer: >60 mL/min (ref >60)
GFR calc non Af Amer: >60 mL/min (ref >60)
Glucose, Bld: 132 mg/dL — ABNORMAL HIGH (ref 65–99)
Potassium: 3.8 mmol/L (ref 3.5–5.1)
Sodium: 136 mmol/L (ref 135–145)

## 2017-09-02 MED ORDER — PANTOPRAZOLE SODIUM 40 MG PO TBEC
40.0000 mg | DELAYED_RELEASE_TABLET | Freq: Every day | ORAL | Status: DC
  Administered 2017-09-02 – 2017-09-04 (×3): 40 mg via ORAL
  Filled 2017-09-02 (×3): qty 1

## 2017-09-02 MED ORDER — ANGIOPLASTY BOOK
Freq: Once | Status: AC
  Administered 2017-09-02: 1
  Filled 2017-09-02: qty 1

## 2017-09-02 MED ORDER — ATORVASTATIN CALCIUM 80 MG PO TABS
80.0000 mg | ORAL_TABLET | Freq: Every day | ORAL | Status: DC
  Administered 2017-09-02 – 2017-09-03 (×2): 80 mg via ORAL
  Filled 2017-09-02 (×2): qty 1

## 2017-09-02 NOTE — Progress Notes (Signed)
09/02/2017 1300 Received pt to room 4E-16 from [facility].  Pt is A&O, no C/O voiced.  Tele monitor applied and CCMD notified.  Oriented to room, call light and bed.  Call bell in reach.  Bed alarm on. [place]

## 2017-09-02 NOTE — Plan of Care (Signed)
  Activity: Risk for activity intolerance will decrease 09/02/2017 2303 - Progressing by Peggye Pitt, RN   Spiritual Needs Ability to function at adequate level 09/02/2017 2303 - Progressing by Peggye Pitt, RN   Clinical Measurements: Will remain free from infection 09/02/2017 2303 - Completed/Met by Peggye Pitt, RN   Clinical Measurements: Diagnostic test results will improve 09/02/2017 2303 - Completed/Met by Peggye Pitt, RN   Clinical Measurements: Respiratory complications will improve 09/02/2017 2303 - Completed/Met by Peggye Pitt, RN

## 2017-09-02 NOTE — Progress Notes (Signed)
Progress Note  Patient Name: John Blackburn Date of Encounter: 09/02/2017  Primary Cardiologist: Dr Gwenlyn Found  Subjective   Weak this am. No chest pain or SOB.   Inpatient Medications    Scheduled Meds: . aspirin EC  81 mg Oral Daily  . atenolol  50 mg Oral Daily   And  . chlorthalidone  25 mg Oral Daily  . buPROPion  300 mg Oral Daily  . sertraline  50 mg Oral Daily  . ticagrelor  90 mg Oral BID   Continuous Infusions: . sodium chloride     PRN Meds: sodium chloride, acetaminophen, ondansetron (ZOFRAN) IV, sodium chloride flush   Vital Signs    Vitals:   09/02/17 0100 09/02/17 0200 09/02/17 0330 09/02/17 0412  BP: 112/70  99/65 113/77  Pulse: 78  69 82  Resp: (!) 25  19 (!) 22  Temp: 100.3 F (37.9 C) 98.5 F (36.9 C) (!) 97.4 F (36.3 C)   TempSrc: Oral Oral Oral   SpO2: 99%  93% 91%  Weight:   229 lb 8 oz (104.1 kg)   Height:   6\' 1"  (1.854 m)     Intake/Output Summary (Last 24 hours) at 09/02/2017 0730 Last data filed at 09/02/2017 0110 Gross per 24 hour  Intake 435 ml  Output 1150 ml  Net -715 ml   Filed Weights   08/31/17 0530 09/01/17 0431 09/02/17 0330  Weight: 236 lb 3.2 oz (107.1 kg) 231 lb 6.4 oz (105 kg) 229 lb 8 oz (104.1 kg)    Telemetry    NSR - Personally Reviewed  ECG    NSR- inferior posterior MI - Personally Reviewed  Physical Exam   GEN: No acute distress.   Neck: No JVD Cardiac: RRR, no murmurs, rubs, or gallops.  Respiratory: Clear to auscultation bilaterally. GI: Soft, nontender, non-distended  MS: No edema; No deformity. Rt RA site without hematoma Neuro:  Nonfocal  Psych: Normal affect   Labs    Chemistry Recent Labs  Lab 08/31/17 0223 09/01/17 0538 09/02/17 0345  NA 136 136 136  K 2.9* 3.5 3.8  CL 96* 99* 100*  CO2 28 26 22   GLUCOSE 108* 105* 132*  BUN 11 10 16   CREATININE 0.74 0.84 0.89  CALCIUM 8.6* 8.8* 9.1  GFRNONAA >60 >60 >60  GFRAA >60 >60 >60  ANIONGAP 12 11 14      Hematology Recent  Labs  Lab 08/31/17 0223 09/01/17 0538 09/02/17 0345  WBC 9.1 7.8 11.5*  RBC 3.88* 4.04* 4.16*  HGB 12.8* 13.3 13.8  HCT 37.9* 40.0 41.5  MCV 97.7 99.0 99.8  MCH 33.0 32.9 33.2  MCHC 33.8 33.3 33.3  RDW 13.3 13.5 13.7  PLT 204 242 296    Cardiac Enzymes Recent Labs  Lab 08/30/17 1450 08/30/17 2113 08/31/17 0223  TROPONINI 2.60* 2.75* 2.70*    Recent Labs  Lab 08/30/17 1007  TROPIPOC 2.74*     BNP Recent Labs  Lab 08/30/17 1004  BNP 349.2*     DDimer No results for input(s): DDIMER in the last 168 hours.   Radiology    No results found.  Cardiac Studies   Echo 08/30/17- Normal LVF with mild LVH, mild AO root enlargement, moderate pulmonary pressure  Cath/ PCI 09/01/17- 80% mRCA- DES 85% PDA- DES Total mCFX- med Rx for now 25% LAD  Patient Profile     71 y.o. male with a history of previous unremarkable cardiac work up in 2014 with normal echo and  low risk Myoview, presented 08/30/17 with acute CHF and NSTEMI. Cath and PCI as above.   Assessment & Plan    1. NSTEMI: -Troponin: 2.74>>2.6>>2.75>>2.70  2. Acute CHF: echo showed normal EF with G1DD with moderately elevated pulmonary pressure.  I/O 2.9L out since admission- wgt 238-229  3. Hypertension : Stable  4. Hyperlipidemia: LDL 114- add high dose statin  5. Hypokalemia:  -K+  repleted  6. Fever: Temp 103 last night. No dysuria. No cough. Urine malodorous per RN, will check UA. Pt was "unsteady this am when ambulated". Will ask Cardiac Rehab to mobilize.  Plan: He probably needs another day to mobilize and follow temp. Add Lipitor 80 mg, add PPI with history of PUD, not sure Tenoretic is the best anti hypertensive choice- will discuss with MD. 7-14 day TOC f/u will be arranged.    For questions or updates, please contact [place] Please consult [email or web address] for contact info under Cardiology/STEMI.      Signed, Kerin Ransom, PA-C  09/02/2017, 7:30 AM

## 2017-09-02 NOTE — Progress Notes (Signed)
CARDIAC REHAB PHASE I   PRE:  Rate/Rhythm: 88 SR  BP:  Supine:   Sitting: 95/58  Standing: 92/45   SaO2: 93%RA  MODE:  Ambulation: 88 ft   POST:  Rate/Rhythm: 91 SR  BP:  Supine:   Sitting: 117/86  Standing:    SaO2: 98-99%RA 0855-1005 Pt walked 88 FT on RA with gait belt use, rolling walker and asst x 2 with unsteady gait. No c/o dizziness or CP.  Back to bed after walk with bed alarm on. Pt seemed to have a little difficulty moving feet. Would recommend PT to evaluate for home needs since pt lives alone. Son can be with him first week. MI and CHF ed done with pt and son. Son had many questions. Reviewed MI restrictions, NTG use, brilinta and importance with stent, watching sodium and daily weights. Could not find CHF booklet in room so gave pt another one and reviewed when to call MD. Hanley Seamen low sodium diets and discussed 2000 mg restriction. Pt will need some reinforcement. Son taking notes. Discussed CRP 2 and will refer to [place]. Did not give ex ed as unsafe at this time. Son has brilinta card.   Graylon Good, RN BSN  09/02/2017 9:59 AM

## 2017-09-03 LAB — BASIC METABOLIC PANEL
Anion gap: 11 (ref 5–15)
BUN: 16 mg/dL (ref 6–20)
CO2: 24 mmol/L (ref 22–32)
Calcium: 9.2 mg/dL (ref 8.9–10.3)
Chloride: 100 mmol/L — ABNORMAL LOW (ref 101–111)
Creatinine, Ser: 0.87 mg/dL (ref 0.61–1.24)
GFR calc Af Amer: >60 mL/min (ref >60)
GFR calc non Af Amer: >60 mL/min (ref >60)
Glucose, Bld: 135 mg/dL — ABNORMAL HIGH (ref 65–99)
Potassium: 3.9 mmol/L (ref 3.5–5.1)
Sodium: 135 mmol/L (ref 135–145)

## 2017-09-03 LAB — CBC
HCT: 41.2 % (ref 39.0–52.0)
Hemoglobin: 13.9 g/dL (ref 13.0–17.0)
MCH: 33.3 pg (ref 26.0–34.0)
MCHC: 33.7 g/dL (ref 30.0–36.0)
MCV: 98.8 fL (ref 78.0–100.0)
Platelets: 286 10*3/uL (ref 150–400)
RBC: 4.17 MIL/uL — ABNORMAL LOW (ref 4.22–5.81)
RDW: 13.6 % (ref 11.5–15.5)
WBC: 13.5 10*3/uL — ABNORMAL HIGH (ref 4.0–10.5)

## 2017-09-03 MED ORDER — CLOPIDOGREL BISULFATE 75 MG PO TABS
75.0000 mg | ORAL_TABLET | Freq: Every day | ORAL | Status: DC
  Administered 2017-09-04: 75 mg via ORAL
  Filled 2017-09-03: qty 1

## 2017-09-03 MED ORDER — CLOPIDOGREL BISULFATE 75 MG PO TABS
300.0000 mg | ORAL_TABLET | Freq: Once | ORAL | Status: AC
  Administered 2017-09-03: 300 mg via ORAL
  Filled 2017-09-03: qty 4

## 2017-09-03 NOTE — Progress Notes (Signed)
Subjective:  Still complains of significant dyspnea.  LV function was normal by echo.  Dyspnea began after intervention and BRILINTA.  No fever since yesterday.  Objective:  Vital Signs in the last 24 hours: BP 105/76   Pulse 70   Temp 98.6 F (37 C) (Oral)   Resp (!) 30   Ht 6\' 1"  (1.854 m)   Wt 103.1 kg (227 lb 4.8 oz)   SpO2 94%   BMI 29.99 kg/m   Physical Exam: Pleasant male in no acute distress Lungs:  Clear Cardiac:  Regular rhythm, normal S1 and S2, no S3 Extremities:  No edema present  Intake/Output from previous day: 12/29 0701 - 12/30 0700 In: 0  Out: 1375 [[id number]]  Weight Filed Weights   09/01/17 0431 09/02/17 0330 09/03/17 0354  Weight: 105 kg (231 lb 6.4 oz) 104.1 kg (229 lb 8 oz) 103.1 kg (227 lb 4.8 oz)    Lab Results: Basic Metabolic Panel: Recent Labs    09/02/17 0345 09/03/17 0317  NA 136 135  K 3.8 3.9  CL 100* 100*  CO2 22 24  GLUCOSE 132* 135*  BUN 16 16  CREATININE 0.89 0.87   CBC: Recent Labs    09/02/17 0345 09/03/17 0317  WBC 11.5* 13.5*  HGB 13.8 13.9  HCT 41.5 41.2  MCV 99.8 98.8  PLT 296 286   Telemetry: Personally reviewed, normal sinus rhythm  Assessment/Plan:  1.  Non-STEMI with stenting of the circumflex currently no angina 2.  Dyspnea likely due to BRILINTA-Will discontinue BRILINTA and give 300 mg Plavix at next dose and then changed to Plavix 3.  Hypertension controlled 4.  Fever resolved  Recommendations:  Still quite anxious and is unsteady on his feet.  Discontinue BRILINTA and placed on Plavix and watch overnight.  If stable discharge in morning.     Kerry Hough  MD [facility]  09/03/2017, 11:59 AM

## 2017-09-03 NOTE — Evaluation (Signed)
Physical Therapy Evaluation Patient Details Name: John Blackburn MRN: [id number] DOB: 06/23/1946 Today's Date: 09/03/2017   History of Present Illness  71yo M with no prior cardiac disease, however underwent a Myoview stress test that was negative in 2014. His other history includes hypertension, hyperlipidemia, OSA with CPAP use, post-surgical pulmonary embolism (2014), and BPH with elevated PSA who presented to the ED on 08/30/2017 with increasing shortness of breath, elevated troponin levels, and an abnormal EKG.  Pt admitted for CHF and NSTEMI. He underwent heart cath 09/01/17.   Clinical Impression  Pt admitted with above diagnosis. Pt currently with functional limitations due to the deficits listed below (see PT Problem List). PTA, pt lived alone and was independent with all functional mobility. On eval, he required min assist transfers and min guard assist ambulation 100 feet with RW. Pt reports at discharge he will be staying with family who can provide 24-hour assist. He reports having a RW at home.  Pt will benefit from skilled PT to increase their independence and safety with mobility to allow discharge to the venue listed below.       Follow Up Recommendations Home health PT;Supervision/Assistance - 24 hour    Equipment Recommendations  None recommended by PT    Recommendations for Other Services       Precautions / Restrictions Precautions Precautions: Fall Restrictions Weight Bearing Restrictions: No      Mobility  Bed Mobility Overal bed mobility: Needs Assistance Bed Mobility: Supine to Sit     Supine to sit: Supervision     General bed mobility comments: supervision for safety, +rail, increased time and effort  Transfers Overall transfer level: Needs assistance Equipment used: Rolling walker (2 wheeled) Transfers: Sit to/from Stand Sit to Stand: Min assist;From elevated surface         General transfer comment: verbal cues for hand placement, assist  to power up  Ambulation/Gait Ambulation/Gait assistance: Min guard Ambulation Distance (Feet): 100 Feet Assistive device: Rolling walker (2 wheeled) Gait Pattern/deviations: Step-through pattern;Decreased stride length Gait velocity: decreased Gait velocity interpretation: Below normal speed for age/gender General Gait Details: Pt ambulated on RA with c/o SOB despite SpO2 reading 100%. Gait distance limited by SOB.  Stairs            Wheelchair Mobility    Modified Rankin (Stroke Patients Only)       Balance Overall balance assessment: Needs assistance Sitting-balance support: No upper extremity supported;Feet supported Sitting balance-Leahy Scale: Good     Standing balance support: Bilateral upper extremity supported;During functional activity Standing balance-Leahy Scale: Fair                               Pertinent Vitals/Pain Pain Assessment: No/denies pain    Home Living Family/patient expects to be discharged to:: Private residence Living Arrangements: Alone Available Help at Discharge: Family;Available 24 hours/day Type of Home: Other(Comment)(condo) Home Access: Stairs to enter Entrance Stairs-Rails: Right;Left Entrance Stairs-Number of Steps: 2 Home Layout: One level Home Equipment: Walker - 2 wheels      Prior Function Level of Independence: Independent               Hand Dominance        Extremity/Trunk Assessment   Upper Extremity Assessment Upper Extremity Assessment: Overall WFL for tasks assessed    Lower Extremity Assessment Lower Extremity Assessment: Generalized weakness    Cervical / Trunk Assessment Cervical / Trunk Assessment: Normal  Communication   Communication: No difficulties  Cognition Arousal/Alertness: Awake/alert Behavior During Therapy: Flat affect Overall Cognitive Status: Within Functional Limits for tasks assessed                                        General Comments       Exercises     Assessment/Plan    PT Assessment Patient needs continued PT services  PT Problem List Decreased mobility;Decreased safety awareness;Decreased knowledge of precautions;Decreased activity tolerance;Cardiopulmonary status limiting activity;Decreased balance;Decreased knowledge of use of DME       PT Treatment Interventions DME instruction;Therapeutic activities;Gait training;Therapeutic exercise;Patient/family education;Stair training;Balance training;Functional mobility training    PT Goals (Current goals can be found in the Care Plan section)  Acute Rehab PT Goals Patient Stated Goal: home PT Goal Formulation: With patient Time For Goal Achievement: 09/17/17 Potential to Achieve Goals: Good    Frequency Min 3X/week   Barriers to discharge        Co-evaluation               AM-PAC PT "6 Clicks" Daily Activity  Outcome Measure Difficulty turning over in bed (including adjusting bedclothes, sheets and blankets)?: None Difficulty moving from lying on back to sitting on the side of the bed? : A Little Difficulty sitting down on and standing up from a chair with arms (e.g., wheelchair, bedside commode, etc,.)?: A Lot Help needed moving to and from a bed to chair (including a wheelchair)?: A Little Help needed walking in hospital room?: A Little Help needed climbing 3-5 steps with a railing? : A Little 6 Click Score: 18    End of Session Equipment Utilized During Treatment: Gait belt Activity Tolerance: Patient tolerated treatment well Patient left: with call bell/phone within reach;in chair;with chair alarm set Nurse Communication: Mobility status PT Visit Diagnosis: Unsteadiness on feet (R26.81);Muscle weakness (generalized) (M62.81)    Time: 1751-0258 PT Time Calculation (min) (ACUTE ONLY): 20 min   Charges:   PT Evaluation $PT Eval Moderate Complexity: 1 Mod     PT G Codes:        John Blackburn, PT  Office # [phone] Pager  [phone]   John Blackburn 09/03/2017, 10:34 AM

## 2017-09-04 ENCOUNTER — Encounter (HOSPITAL_COMMUNITY): Payer: Self-pay | Admitting: Cardiology

## 2017-09-04 DIAGNOSIS — I2699 Other pulmonary embolism without acute cor pulmonale: Secondary | ICD-10-CM | POA: Insufficient documentation

## 2017-09-04 DIAGNOSIS — D689 Coagulation defect, unspecified: Secondary | ICD-10-CM | POA: Insufficient documentation

## 2017-09-04 DIAGNOSIS — I5041 Acute combined systolic (congestive) and diastolic (congestive) heart failure: Secondary | ICD-10-CM

## 2017-09-04 DIAGNOSIS — K219 Gastro-esophageal reflux disease without esophagitis: Secondary | ICD-10-CM | POA: Insufficient documentation

## 2017-09-04 DIAGNOSIS — I2510 Atherosclerotic heart disease of native coronary artery without angina pectoris: Secondary | ICD-10-CM

## 2017-09-04 DIAGNOSIS — Z9861 Coronary angioplasty status: Secondary | ICD-10-CM

## 2017-09-04 DIAGNOSIS — F321 Major depressive disorder, single episode, moderate: Secondary | ICD-10-CM | POA: Insufficient documentation

## 2017-09-04 DIAGNOSIS — Z86010 Personal history of colonic polyps: Secondary | ICD-10-CM | POA: Insufficient documentation

## 2017-09-04 DIAGNOSIS — G4731 Primary central sleep apnea: Secondary | ICD-10-CM | POA: Insufficient documentation

## 2017-09-04 DIAGNOSIS — C801 Malignant (primary) neoplasm, unspecified: Secondary | ICD-10-CM | POA: Insufficient documentation

## 2017-09-04 DIAGNOSIS — T7840XA Allergy, unspecified, initial encounter: Secondary | ICD-10-CM | POA: Insufficient documentation

## 2017-09-04 DIAGNOSIS — K5732 Diverticulitis of large intestine without perforation or abscess without bleeding: Secondary | ICD-10-CM | POA: Insufficient documentation

## 2017-09-04 DIAGNOSIS — K279 Peptic ulcer, site unspecified, unspecified as acute or chronic, without hemorrhage or perforation: Secondary | ICD-10-CM | POA: Insufficient documentation

## 2017-09-04 LAB — CBC
HCT: 41.2 % (ref 39.0–52.0)
Hemoglobin: 14 g/dL (ref 13.0–17.0)
MCH: 33.7 pg (ref 26.0–34.0)
MCHC: 34 g/dL (ref 30.0–36.0)
MCV: 99 fL (ref 78.0–100.0)
Platelets: 299 10*3/uL (ref 150–400)
RBC: 4.16 MIL/uL — ABNORMAL LOW (ref 4.22–5.81)
RDW: 13.8 % (ref 11.5–15.5)
WBC: 11.8 10*3/uL — ABNORMAL HIGH (ref 4.0–10.5)

## 2017-09-04 LAB — BASIC METABOLIC PANEL
Anion gap: 13 (ref 5–15)
BUN: 17 mg/dL (ref 6–20)
CO2: 23 mmol/L (ref 22–32)
Calcium: 8.9 mg/dL (ref 8.9–10.3)
Chloride: 100 mmol/L — ABNORMAL LOW (ref 101–111)
Creatinine, Ser: 1.01 mg/dL (ref 0.61–1.24)
GFR calc Af Amer: >60 mL/min (ref >60)
GFR calc non Af Amer: >60 mL/min (ref >60)
Glucose, Bld: 132 mg/dL — ABNORMAL HIGH (ref 65–99)
Potassium: 3.6 mmol/L (ref 3.5–5.1)
Sodium: 136 mmol/L (ref 135–145)

## 2017-09-04 MED ORDER — ASPIRIN 81 MG PO TBEC: 81.0000 mg | DELAYED_RELEASE_TABLET | Freq: Every day | ORAL | Status: AC

## 2017-09-04 MED ORDER — CLOPIDOGREL BISULFATE 75 MG PO TABS: 75.0000 mg | ORAL_TABLET | Freq: Every day | ORAL | 2 refills | Status: DC

## 2017-09-04 MED ORDER — PANTOPRAZOLE SODIUM 40 MG PO TBEC: 40.0000 mg | DELAYED_RELEASE_TABLET | Freq: Every day | ORAL | 2 refills | Status: DC

## 2017-09-04 MED ORDER — NITROGLYCERIN 0.4 MG SL SUBL: 0.4000 mg | SUBLINGUAL_TABLET | SUBLINGUAL | 2 refills | Status: AC | PRN

## 2017-09-04 MED ORDER — ATORVASTATIN CALCIUM 80 MG PO TABS: 80.0000 mg | ORAL_TABLET | Freq: Every day | ORAL | 1 refills | Status: DC

## 2017-09-04 NOTE — Care Management Important Message (Signed)
Important Message  Patient Details  Name: John Blackburn MRN: [id number] Date of Birth: 04/29/1946   Medicare Important Message Given:  Yes    Orbie Pyo 09/04/2017, 1:56 PM

## 2017-09-04 NOTE — Care Management Note (Addendum)
Case Management Note Marvetta Gibbons RN, BSN Unit 4E-Case Manager [phone]  Patient Details  Name: John Blackburn MRN: [id number] Date of Birth: December 13, 1945  Subjective/Objective:   Pt admitted with NSTEMI, s/p DES- was started on Brilinta- however developed SOB and drug was discontinued now on Plavix               Action/Plan: PTA pt lived at home- anticipate return home- CM to follow  Expected Discharge Date:    09/04/17              Expected Discharge Plan:  Home/Self Care  In-House Referral:   N/A  Discharge planning Services  CM Consult  Post Acute Care Choice:   Home Health Choice offered to:   Patient  DME Arranged:   NA DME Agency:   NA  HH Arranged:   HHPT HH Agency:    [facility]  Status of Service:  Completed  If discussed at [place] of Stay Meetings, dates discussed:    Discharge Disposition: Home/home health  Additional Comments:  09/04/17- 1145- Kaneesha Constantino RN, CM- notified by [facility]- bedside RN that pt now has [place] orders for discharge- pt going home today- spoke with pt at bedside- choice offered for [facility] agency in [facility]- per pt choice he would like to use [facility] for HHPT needs- pt thinks he already has RW at home- will check and let Rn know if he needs one for discharge- referral called to Butch Penny with [facility] for [facility] PT needs.   Dawayne Patricia, RN 09/04/2017, 11:04 AM

## 2017-09-04 NOTE — Consult Note (Signed)
   [facility] Inpatient Consult   09/04/2017  EULA MAZZOLA [birth year] [phone]  Patient screened for potential [place] services. Patient is in the [place] of the [facility] services under patient's [employer] plan.  Spoke with inpatient RNCM regarding patient.  She states patient will discharge on new medications.  Patient with acute  HF with NSTEMI. Patient lives alone.   Met with the patient at the bedside. Explained [place] [facility] services in the [place].  Patient did not feel he needed home visits from nurse as he will have Physical Therapy with [facility].  Patient's primary care provider is Dr. Pricilla Holm, [facility].  This office is listed to provide the transition of care follow up calls.   Patient could benefit from [facility] HF follow up calls as well. Patient states he would appreciate it.  For questions contact:   Natividad Brood, RN BSN [facility] Liaison  [phone] business mobile phone Toll free office [phone]

## 2017-09-04 NOTE — Discharge Summary (Signed)
Discharge Summary    Patient ID: John Blackburn,  MRN: [id number], DOB/AGE: 1946-03-23 71 y.o.  Admit date: 08/30/2017 Discharge date: 09/04/2017  Primary Care Provider: Hoyt Koch Primary Cardiologist: Gwenlyn Found   Discharge Diagnoses    Principal Problem:   Acute congestive heart failure [facility]) Active Problems:   Non-ST elevation (NSTEMI) myocardial infarction [facility])   Hypertension   Obstructive sleep apnea   History of pulmonary embolism   Family history of early CAD   CAD S/P percutaneous coronary angioplasty   Hx of gastroesophageal reflux (GERD)   History of depression   Allergies Allergies  Allergen Reactions  . Cephalexin Rash  . Levofloxacin Rash    Diagnostic Studies/Procedures    TTE: 08/30/17  Study Conclusions  - Left ventricle: The cavity size was mildly dilated. Wall   thickness was increased in a pattern of mild LVH. Systolic   function was normal. The estimated ejection fraction was in the   range of 55% to 60%. Wall motion was normal; there were no   regional wall motion abnormalities. Doppler parameters are   consistent with abnormal left ventricular relaxation (grade 1   diastolic dysfunction). - Aortic valve: There was trivial regurgitation. - Aortic root: The aortic root was mildly dilated. - Mitral valve: There was mild regurgitation. - Pulmonary arteries: Systolic pressure was moderately increased.   PA peak pressure: 49 mm Hg (S).  Impressions:  - Normal LV systolic function; mild LVH and LVE; mild diastolic   dysfunction; trace AI; mildly dilated aortic root; mild MR; mild   TR; moderately elevated pulmonary pressure.  Cath: 09/01/17  Conclusion     Mid RCA lesion is 80% stenosed.  A drug-eluting stent was successfully placed using a STENT SYNERGY DES 3.5X16, postdilated to 4 mm.  Post intervention, there is a 0% residual stenosis.  RPDA lesion is 85% stenosed.  A drug-eluting stent was successfully  placed using a STENT SYNERGY DES 2.25X16.  Post intervention, there is a 0% residual stenosis.  Prox Cx lesion is 100% stenosed. This is a chronic total occlusion.  Prox LAD lesion is 25% stenosed.  Mid LAD lesion is 25% stenosed.  The left ventricular systolic function is normal.  LV end diastolic pressure is normal.  The left ventricular ejection fraction is 50-55% by visual estimate.  There is no aortic valve stenosis.  LV end diastolic pressure is normal.  CO 6.5 L/min; CI 2.83, PA sat 71%; Ao sat 97%; mean PCWP 10 mm Hg   COntinue aggressive secondary prevention.    Consider CTO PCI of the circumflex in the future.    Likely discharge in AM.  Plan for dual antiplatelet therapy for at least 12 months given NSTEMI.   _____________   History of Present Illness     John Blackburn is a 71yo M with a prior evaluation for possible cardiac ischemia and abnormal GTX test in 04/2013. Subsequently, he underwent a Myoview Lexiscan stress test in 05/2013, which was normal. Echocardiogram was performed (04/2013) showed an EF of 55-60% with no wall motion abnormalities and mild LVH. He reports having a strong family history of sudden cardiac death with family members in their late 61's to early 7's.   He presented to the ED on 08/30/17 after increasing shortness of breath over the past one week. He reports that he has been sleeping in a recliner during this time due to orthopnea when lying flat. He has had increased LE pitting edema, but otherwise has  been in his usual state of health.   He has had no chest pain during this episiode. He denies increased sodium intake in his diet. 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 He reports that he wears a CPAP machine on a regular basis with no problems and is compliant with his medications. He sees a primary care provider for most of his care.    In the ED, his troponin level was 2.74, creatinine 0.08 and  K+ was 3.1. Hb/Hct  13.9 and 41.0. EKG showed ST elevation in lead III and ST depression in aVL, V2-V3. CXR revealed no edema or consolidation and a stable cardiac silhouette. There is a echocardiogram pending at this time. He was given 40 IV Lasix and 45meq of K+.   Hospital Course     He was started on IV heparin and troponin peaked at 2.75. Diuresed well with IV lasix with total UOP of 4.5. Weight dropped from 248>>226lbs. Overall respiratory status improved along with LE edema. Echo showed normal EF with no WMA. He underwent cardiac cath with PCI/DES to the [facility], and RPDA. Also noted a CTO of the pLCX with collaterals. Initially placed on DAPT with ASA/Brilinta but reported significant shortness of breath. Also developed fever. UA was negative. He was ambulated with cardiac rehab who recommended PT evaluation. Seen by PT who recommended HHPT at time of discharge. Plans to stay with family at the time of discharge. Given Brilinta related dyspnea he was switched/loaded with plavix with improvement. LDL 114. Started in high dose statin.   General: Well developed, well nourished, male appearing in no acute distress. Head: Normocephalic, atraumatic.  Neck: Supple without bruits, JVD. Lungs:  Resp regular and unlabored, CTA. Heart: RRR, S1, S2, no S3, S4, or murmur; no rub. Abdomen: Soft, non-tender, non-distended with normoactive bowel sounds. No hepatomegaly. No rebound/guarding. No obvious abdominal masses. Extremities: No clubbing, cyanosis, edema. Distal pedal pulses are 2+ bilaterally. R radial cath site stable without hematoma. Neuro: Alert and oriented X 3. Moves all extremities spontaneously. Psych: Normal affect.  [name] was seen by Dr. Ellyn Hack and determined stable for discharge home. Follow up in the office has been arranged. Medications are listed below.   _____________  Discharge Vitals Blood pressure 116/77, pulse 69, temperature 98.3 F (36.8 C), temperature source Oral, resp. rate 19,  height 6\' 1"  (1.854 m), weight 226 lb 3.1 oz (102.6 kg), SpO2 100 %.  Filed Weights   09/02/17 0330 09/03/17 0354 09/04/17 0530  Weight: 229 lb 8 oz (104.1 kg) 227 lb 4.8 oz (103.1 kg) 226 lb 3.1 oz (102.6 kg)    Labs & Radiologic Studies    CBC Recent Labs    09/03/17 0317 09/04/17 0309  WBC 13.5* 11.8*  HGB 13.9 14.0  HCT 41.2 41.2  MCV 98.8 99.0  PLT 286 637   Basic Metabolic Panel Recent Labs    09/03/17 0317 09/04/17 0309  NA 135 136  K 3.9 3.6  CL 100* 100*  CO2 24 23  GLUCOSE 135* 132*  BUN 16 17  CREATININE 0.87 1.01  CALCIUM 9.2 8.9   Liver Function Tests No results for input(s): AST, ALT, ALKPHOS, BILITOT, PROT, ALBUMIN in the last 72 hours. No results for input(s): LIPASE, AMYLASE in the last 72 hours. Cardiac Enzymes No results for input(s): CKTOTAL, CKMB, CKMBINDEX, TROPONINI in the last 72 hours. BNP Invalid input(s): POCBNP D-Dimer No results for input(s): DDIMER in the last 72 hours. Hemoglobin A1C No  results for input(s): HGBA1C in the last 72 hours. Fasting Lipid Panel No results for input(s): CHOL, HDL, LDLCALC, TRIG, CHOLHDL, LDLDIRECT in the last 72 hours. Thyroid Function Tests No results for input(s): TSH, T4TOTAL, T3FREE, THYROIDAB in the last 72 hours.  Invalid input(s): FREET3 _____________  Dg Chest Portable 1 View  Result Date: 08/30/2017 CLINICAL DATA:  Shortness of Breath EXAM: PORTABLE CHEST 1 VIEW COMPARISON:  September 23, 2016 FINDINGS: There is a small calcified granuloma in the right base. There is no edema or consolidation. Heart size and pulmonary vascularity are normal. No adenopathy. Bones are osteoporotic. There is a focus of bony bridging anteriorly between the right fifth and sixth ribs. IMPRESSION: Small granuloma right base. No edema or consolidation. Stable cardiac silhouette. Electronically Signed   By: Lowella Grip III M.D.   On: 08/30/2017 10:30   Disposition   Pt is being discharged home today in good  condition.  Follow-up Plans & Appointments    Follow-up Information    Barrett, Evelene Croon, PA-C Follow up on 09/18/2017.   Specialties:  Cardiology, Radiology Why:  at [place] for your follow up appt.  Contact information: [address] [place] [address] [phone]          Discharge Instructions    Amb Referral to Cardiac Rehabilitation   Complete by:  As directed    Diagnosis:   NSTEMI Coronary Stents     Call MD for:  redness, tenderness, or signs of infection (pain, swelling, redness, odor or green/yellow discharge around incision site)   Complete by:  As directed    Diet - low sodium heart healthy   Complete by:  As directed    Discharge instructions   Complete by:  As directed    Radial Site Care Refer to this sheet in the next few weeks. These instructions provide you with information on caring for yourself after your procedure. Your caregiver may also give you more specific instructions. Your treatment has been planned according to current medical practices, but problems sometimes occur. Call your caregiver if you have any problems or questions after your procedure. HOME CARE INSTRUCTIONS You may shower the day after the procedure.Remove the bandage (dressing) and gently wash the site with plain soap and water.Gently pat the site dry.  Do not apply powder or lotion to the site.  Do not submerge the affected site in water for 3 to 5 days.  Inspect the site at least twice daily.  Do not flex or bend the affected arm for 24 hours.  No lifting over 5 pounds (2.3 kg) for 5 days after your procedure.  Do not drive home if you are discharged the same day of the procedure. Have someone else drive you.  You may drive 24 hours after the procedure unless otherwise instructed by your caregiver.  What to expect: Any bruising will usually fade within 1 to 2 weeks.  Blood that collects in the tissue (hematoma) may be painful to the touch. It should usually decrease  in size and tenderness within 1 to 2 weeks.  SEEK IMMEDIATE MEDICAL CARE IF: You have unusual pain at the radial site.  You have redness, warmth, swelling, or pain at the radial site.  You have drainage (other than a small amount of blood on the dressing).  You have chills.  You have a fever or persistent symptoms for more than 72 hours.  You have a fever and your symptoms suddenly get worse.  Your arm  becomes pale, cool, tingly, or numb.  You have heavy bleeding from the site. Hold pressure on the site.   PLEASE DO NOT MISS ANY DOSES OF YOUR PLAVIX!!!!! Also keep a log of you blood pressures and bring back to your follow up appt. Please call the office with any questions.   Patients taking blood thinners should generally stay away from medicines like ibuprofen, Advil, Motrin, naproxen, and Aleve due to risk of stomach bleeding. You may take Tylenol as directed or talk to your primary doctor about alternatives.  Some studies suggest Prilosec/Omeprazole interacts with Plavix. We changed your Prilosec/Omeprazole to the equivalent dose of Protonix for less chance of interaction.   Face-to-face encounter (required for Medicare/Medicaid patients)   Complete by:  As directed    I Reino Bellis certify that this patient is under my care and that I, or a nurse practitioner or physician's assistant working with me, had a face-to-face encounter that meets the physician face-to-face encounter requirements with this patient on 09/04/2017. The encounter with the patient was in whole, or in part for the following medical condition(s) which is the primary reason for home health care (List medical condition): CAD, CHF, Deconditioned   The encounter with the patient was in whole, or in part, for the following medical condition, which is the primary reason for home health care:  CAD, CHF, Deconditioned   I certify that, based on my findings, the following services are medically necessary home health services:   Physical therapy   Reason for Medically Necessary Home Health Services:  Therapy- Personnel officer, Public librarian   My clinical findings support the need for the above services:  Shortness of breath with activity   Further, I certify that my clinical findings support that this patient is homebound due to:  Ambulates short distances less than 300 feet   Home Health   Complete by:  As directed    To provide the following care/treatments:  PT   Increase activity slowly   Complete by:  As directed       Discharge Medications     Medication List    TAKE these medications   aspirin 81 MG EC tablet Take 1 tablet (81 mg total) by mouth daily. Start taking on:  09/05/2017   atenolol-chlorthalidone 50-25 MG tablet Commonly known as:  TENORETIC TAKE ONE TABLET BY MOUTH ONCE DAILY   atorvastatin 80 MG tablet Commonly known as:  LIPITOR Take 1 tablet (80 mg total) by mouth daily at 6 PM.   buPROPion 300 MG 24 hr tablet Commonly known as:  WELLBUTRIN XL TAKE 1 TABLET BY MOUTH ONCE DAILY   clopidogrel 75 MG tablet Commonly known as:  PLAVIX Take 1 tablet (75 mg total) by mouth daily. Start taking on:  09/05/2017   Magnesium 250 MG Tabs Take by mouth.   Melatonin 1 MG/ML Liqd Take 2.5 mg by mouth at bedtime.   MULTI-VITAMINS Tabs Take by mouth.   nitroGLYCERIN 0.4 MG SL tablet Commonly known as:  NITROSTAT Place 1 tablet (0.4 mg total) under the tongue every 5 (five) minutes as needed.   pantoprazole 40 MG tablet Commonly known as:  PROTONIX Take 1 tablet (40 mg total) by mouth daily at 6 (six) AM. Start taking on:  09/05/2017   sertraline 50 MG tablet Commonly known as:  ZOLOFT TAKE ONE TABLET BY MOUTH ONCE DAILY   tetrahydrozoline 0.05 % ophthalmic solution Place 1 drop into both eyes daily as needed (dry eyes).  Turmeric 500 MG Caps Take by mouth.   VICKS SINEX MOISTURIZING NA Place 1 spray into the nose daily as needed (congestion).         Aspirin prescribed at discharge?  Yes High Intensity Statin Prescribed? (Lipitor 40-80mg  or Crestor 20-40mg ): Yes Beta Blocker Prescribed? Yes For EF <40%, was ACEI/ARB Prescribed? No: EF ok ADP Receptor Inhibitor Prescribed? (i.e. Plavix etc.-Includes Medically Managed Patients): Yes For EF <40%, Aldosterone Inhibitor Prescribed? No: EF ok Was EF assessed during THIS hospitalization? Yes Was Cardiac Rehab II ordered? (Included Medically managed Patients): Yes   Outstanding Labs/Studies   FLP/LFTs in 6 weeks.  Duration of Discharge Encounter   Greater than 30 minutes including physician time.  Signed, Reino Bellis NP-C 09/04/2017, 11:05 AM   Patient seen and examined on morning rounds along with her findings, examination and her discharge summary.  Okay for discharge today.  Feeling better.  Glenetta Hew, M.D., M.S. Interventional Cardiologist   Pager # [phone] Phone # [phone] [address] [place], [place] [address]

## 2017-09-04 NOTE — Progress Notes (Signed)
Physical Therapy Treatment Patient Details Name: John Blackburn MRN: [id number] DOB: 09-14-1945 Today's Date: 09/04/2017    History of Present Illness 71yo M with no prior cardiac disease, however underwent a Myoview stress test that was negative in 2014. His other history includes hypertension, hyperlipidemia, OSA with CPAP use, post-surgical pulmonary embolism (2014), and BPH with elevated PSA who presented to the ED on 08/30/2017 with increasing shortness of breath, elevated troponin levels, and an abnormal EKG.  Pt admitted for CHF and NSTEMI. He underwent heart cath 09/01/17.     PT Comments    Pt performed stair training in prep for d/c home. Pt remains to present with Increased WOB but remains to sat 96% on RA.  Pt tolerated short bouts of gait before requiring rest break in seated position due to DOE.  Pt will benefit from a RW at d/c to improve safety and energy conservation.    Follow Up Recommendations  Home health PT;Supervision/Assistance - 24 hour     Equipment Recommendations  Rolling walker with 5" wheels    Recommendations for Other Services       Precautions / Restrictions Precautions Precautions: Fall Restrictions Weight Bearing Restrictions: No RUE Weight Bearing: Weight bearing as tolerated    Mobility  Bed Mobility Overal bed mobility: Needs Assistance Bed Mobility: Sit to Supine           General bed mobility comments: supervision for safety, +rail, increased time and effort  Transfers Overall transfer level: Needs assistance Equipment used: None Transfers: Sit to/from Stand Sit to Stand: Min guard         General transfer comment: verbal cues for hand placement, min guard to steady for balance.    Ambulation/Gait Ambulation/Gait assistance: Min guard Ambulation Distance (Feet): 20 Feet(x2 trials.  followed by 80 ft x2 trials)   Gait Pattern/deviations: Step-through pattern;Decreased stride length Gait velocity: decreased   General  Gait Details: Pt required frequent rest breaks due to increased WOB with mobility.  Pt required cues for B foot clearance and pacing.  Pt will benefit from a RW for improved energy conservation.  O2 sats on RA 96% despite DOE.     Stairs Stairs: Yes   Stair Management: One rail Left;Forwards;Backwards Number of Stairs: 2 General stair comments: Cues for sequencing and cues for L rail use.  Pt performed forward to ascend and backwards to descend.  Min guard assist for safety.      Wheelchair Mobility    Modified Rankin (Stroke Patients Only)       Balance Overall balance assessment: Needs assistance   Sitting balance-Leahy Scale: Good       Standing balance-Leahy Scale: Fair                              Cognition Arousal/Alertness: Awake/alert Behavior During Therapy: Flat affect Overall Cognitive Status: Within Functional Limits for tasks assessed                                        Exercises      General Comments        Pertinent Vitals/Pain Pain Assessment: No/denies pain    Home Living                      Prior Function  PT Goals (current goals can now be found in the care plan section) Acute Rehab PT Goals Patient Stated Goal: home Potential to Achieve Goals: Good Progress towards PT goals: Progressing toward goals    Frequency    Min 3X/week      PT Plan Current plan remains appropriate    Co-evaluation              AM-PAC PT "6 Clicks" Daily Activity  Outcome Measure  Difficulty turning over in bed (including adjusting bedclothes, sheets and blankets)?: None Difficulty moving from lying on back to sitting on the side of the bed? : A Little Difficulty sitting down on and standing up from a chair with arms (e.g., wheelchair, bedside commode, etc,.)?: A Lot Help needed moving to and from a bed to chair (including a wheelchair)?: A Little Help needed walking in hospital room?: A  Little Help needed climbing 3-5 steps with a railing? : A Little 6 Click Score: 18    End of Session Equipment Utilized During Treatment: Gait belt Activity Tolerance: Patient tolerated treatment well Patient left: with call bell/phone within reach;in chair;with chair alarm set Nurse Communication: Mobility status PT Visit Diagnosis: Unsteadiness on feet (R26.81);Muscle weakness (generalized) (M62.81)     Time: 5790-3833 PT Time Calculation (min) (ACUTE ONLY): 23 min  Charges:  $Gait Training: 8-22 mins $Therapeutic Activity: 8-22 mins                    G Codes:       Governor Rooks, PTA pager [phone]    Cristela Blue 09/04/2017, 1:01 PM

## 2017-09-06 ENCOUNTER — Telehealth: Payer: Self-pay | Admitting: *Deleted

## 2017-09-06 NOTE — Telephone Encounter (Signed)
Pt was on the TCM list admitted 08/30/17 for Acute congestive heart failure. Testing was ordered labs, echo ect. Pt was D/C 09/04/17, and will f/u w/cardiology on 09/20/17...John Blackburn

## 2017-09-06 NOTE — Telephone Encounter (Signed)
A user error has taken place: TCM documentation already done...John Blackburn

## 2017-09-07 ENCOUNTER — Encounter: Payer: Self-pay | Admitting: *Deleted

## 2017-09-07 ENCOUNTER — Other Ambulatory Visit: Payer: Self-pay

## 2017-09-07 ENCOUNTER — Telehealth (HOSPITAL_COMMUNITY): Payer: Self-pay

## 2017-09-07 LAB — CULTURE, BLOOD (ROUTINE X 2)
CULTURE: NO GROWTH
Culture: NO GROWTH
SPECIAL REQUESTS: ADEQUATE
Special Requests: ADEQUATE

## 2017-09-07 NOTE — Telephone Encounter (Signed)
Patients insurance is active and benefits verified through [place] - $15.00 co-pay, no deductible, out of pocket amount of $3,400/$0.00 has been met, no co-insurance, and no pre-authorization is required. Passport/reference [phone]  Patient will be contacted and scheduled after their follow up appt with the Cardiologist office upon review by the RN Navigator.

## 2017-09-07 NOTE — Patient Outreach (Signed)
[facility]) Care Management  09/07/2017  John Blackburn 13-Jan-1946 [id number]   EMMI- Heart Failure RED ON EMMI ALERT Day # 2 Date: 09/06/17 Red Alert Reason: Weighed themselves today? No  Spoke with patient. He is able to verify HIPAA.  Patient states he is not weighing  as he states that his scale is at his house.  He is currently staying with a friend but states he will get the scale from his house.  Discussed with patient heart failure and the signs and symptoms. Patient sharers that it is a new diagnosis for him.  Offered patient disease management calls for his heart failure and he is agreeable. Discussed with patient weight perimeters in order to avoid hospitalization.  He verbalized understanding.  Patient has not seen his primary care since being out of the hospital.  Advised patient that he needed to set an appointment. He verbalized understanding.  Patient noted to have an appointment with cardiology on January 14th.  Reminded patient of that appointment and he agreed.   Plan: RN CM will refer to health coach for disease management of heart failure.  Jone Baseman, RN, MSN [facility] Care Management Care Management Coordinator Direct Line [phone] Toll Free: [phone]  Fax: [phone]

## 2017-09-08 ENCOUNTER — Other Ambulatory Visit: Payer: Self-pay | Admitting: *Deleted

## 2017-09-08 NOTE — Patient Outreach (Addendum)
[facility]) Care Management  09/08/2017  DEQUAN KINDRED 12-23-1945 [id number]  EMMI: Heart Failure RED On EMMI Alert EMMI Day:  #3 Date:  09/08/2017 Red Alert Reason:  Weighed themselves today? No   RN Health Coach Initial Assessment  Referral Date:  09/07/2017 Referral Source:  EMMI Alert Screen Reason for Referral:  Disease Management Education Insurance:  [employer]   Outreach Attempt:  Patient returned call to [employer].  HIPAA confirmed.  Patient initially contacted concerning EMMI Red Alert of not weighing himself this morning.  Patient states that was a mistake.  States he did retrieve his scale from his house and did weigh this morning, weight was 224 pounds.  Discharging weight from hospital was 226 pounds per electronic chart.  Patient also verbalizes he will weigh himself daily.  Reviewed weight limits as to when to call the physician.  States he did retrieve his new prescriptions and is taking as prescribed.  RN Health Coach introduced self and role to patient.  Arranged time next week for initial telephone assessment to be completed.  Patient does verbalize he is staying with a friend and he is unsure of the time period.  States his temporary mailing address is [address] [place], [place] [address].  Plan:  RN Health Coach will make telephone outreach to patient within 5 business days to complete initial telephone assessment.   [place] [phone] [email or web address]

## 2017-09-08 NOTE — Patient Outreach (Signed)
[name]  09/08/2017  John Blackburn 1946-04-12 [id number]  EMMI: Heart Failure RED On EMMI Alert EMMI Day:  #3 Date:  09/08/2017 Red Alert Reason:  Weighed themselves today? No  Outreach Attempt:  Outreach attempt #1 to patient for EMMI [employer]. No answer. RN Health Coach left HIPAA compliant voicemail message along with contact information.  Plan:  RN Health Coach will attempt another telephone outreach within the next 2 business days.  [place] [phone] [name]

## 2017-09-11 ENCOUNTER — Ambulatory Visit: Payer: Self-pay | Admitting: *Deleted

## 2017-09-11 ENCOUNTER — Other Ambulatory Visit: Payer: Self-pay | Admitting: *Deleted

## 2017-09-11 NOTE — Patient Outreach (Signed)
[facility]) Care Management  09/11/2017  KENDON SEDENO [birth year] [id number]  RN Health Coach Initial Assessment  Referral Date:  09/07/2017 Referral Source:  EMMI Alert Screen Reason for Referral:  Disease Management Education Insurance:  Health [employer]   Outreach Attempt:  Returning patient's called missed from this Saturday on telephone call log.  Patient answered.  HIPAA confirmed.  Patient stated he wanted to verify his appointment with [place].  Discussed telephone appointment for telephone initial assessment scheduled for this Wednesday around 2 pm per patient request.  [place] also reviewed and discussed with patient his upcoming scheduled appointments with his primary care, Dr. Sharlet Salina this Friday, 09/15/2017, Rosaria Ferries PA with Cardiology on next Monday, 09/18/2017, and with Pulmonologist Dr. Annamaria Boots on 09/25/2017.  Patient stated his understanding.  Plan:  Patient encouraged to contact [place] for further questions and concerns.  RN Health Coach will make outreach to patient for initial telephonic assessment within the next 3 business days.  [place] Coach [phone] [email or web address]

## 2017-09-13 ENCOUNTER — Other Ambulatory Visit: Payer: Self-pay | Admitting: *Deleted

## 2017-09-13 ENCOUNTER — Encounter: Payer: Self-pay | Admitting: *Deleted

## 2017-09-13 NOTE — Patient Outreach (Signed)
[facility]) Care Management  09/13/2017  John Blackburn 1946/05/28 [id number]  EMMI Alert Follow Up  EMMI: Heart Failure Red on EMMI Alert EMMI Day: #8 Date:  09/12/2017 Red Alert Reason:  Martin Majestic to follow-up appointment: No  Outreach Attempt:  Successful telephone outreach to patient for EMMI Red Alert follow up.  HIPAA verified.  Patient stating he has not missed any follow up medical appointments.  Verbalizes he has primary care appointment this Friday, 09/15/2017 and Cardiology appointment on Monday, 09/18/2017.  Encouraged patient to attend scheduled medical appointments.  Plan:  RN Health Coach will make monthly telephone outreach to patient in the month of February.  [place] [phone] [email or web address]

## 2017-09-13 NOTE — Patient Outreach (Signed)
[facility]) Care Management  [place]  09/13/2017   John Blackburn 04/06/1946 [id number]  RN Health Coach Initial Assessment   Referral Date:  09/07/2017 Referral Source:  EMMI Alert Screen Reason for Referral:  Disease Management Education Insurance:  Health [employer]age   Outreach Attempt:  Successful telephone outreach to patient for initial telephone assessment.  HIPAA verified.  Patient completed initial telephone assessment.  Social:  Patient lives at home alone.  Reports being independent with ADLs and IADLs.  Ambulates independently, but does report as many as 5 falls within the last year.  The last fall was 3 months ago.  Denies any injuries with the falls, stating he just trips over things.  Patient encouraged to discuss frequent falls with his primary care provider at his next appointment.  Patient drives himself to his medical appointments.  DME available in the home include:  scale, blood pressure cuff, shower chair with back, reading glasses, and grab bar in the tub.  Conditions:  Per chart review and discussion with patient, PMH include:  Congestive Heart Failure, NSTEMI, coronary artery disease, arthritis, hypertension, hyperlipidemia, pulmonary embolism, chronic venous insufficiency, depression, peptic ulcer disease, skin cancer, cholecystectomy, hernia repair, total knee replacement, and GERD.  Patient recently had inpatient hospitalization for NSTEMI and CHF exacerbation.  Patient stating this last hospitalization is the first time he has been told he has congestive heart failure.  Reports having 2 coronary stents placed while hospitalized.  Patient states he has never had education on congestive heart failure.  Reports weighing himself daily.  Weight this morning 225 pounds, discharging weight 226 pounds.  Patient states he occasionally monitors his blood pressure.  Medications:  Patient reports compliance with his daily medications.  Denies any  trouble affording or managing medications at this time.  Encounter Medications:  Outpatient Encounter Medications as of 09/13/2017  Medication Sig Note  . aspirin EC 81 MG EC tablet Take 1 tablet (81 mg total) by mouth daily.   [name] atenolol-chlorthalidone (TENORETIC) 50-25 MG tablet TAKE ONE TABLET BY MOUTH ONCE DAILY   . atorvastatin (LIPITOR) 80 MG tablet Take 1 tablet (80 mg total) by mouth daily at 6 PM.   . buPROPion (WELLBUTRIN XL) 300 MG 24 hr tablet TAKE 1 TABLET BY MOUTH ONCE DAILY   . clopidogrel (PLAVIX) 75 MG tablet Take 1 tablet (75 mg total) by mouth daily.   . Magnesium 250 MG TABS Take by mouth.   . Melatonin 1 MG/ML LIQD Take 2.5 mg by mouth at bedtime.  09/13/2017: Taking PRN as needed  . Multiple Vitamin (MULTI-VITAMINS) TABS Take by mouth.   . nitroGLYCERIN (NITROSTAT) 0.4 MG SL tablet Place 1 tablet (0.4 mg total) under the tongue every 5 (five) minutes as needed.   . Oxymetazoline HCl (VICKS SINEX MOISTURIZING NA) Place 1 spray into the nose daily as needed (congestion).   . pantoprazole (PROTONIX) 40 MG tablet Take 1 tablet (40 mg total) by mouth daily at 6 (six) AM.   . sertraline (ZOLOFT) 50 MG tablet TAKE ONE TABLET BY MOUTH ONCE DAILY   . tetrahydrozoline 0.05 % ophthalmic solution Place 1 drop into both eyes daily as needed (dry eyes).   . Turmeric 500 MG CAPS Take by mouth.    No facility-administered encounter medications on file as of 09/13/2017.     Functional Status:  In your present state of health, do you have any difficulty performing the following activities: 09/13/2017 08/30/2017  Hearing? Aggie Moats  Comment "a tiny bit" -  Vision? N N  Difficulty concentrating or making decisions? Y N  Comment sometimes difficulty remembering things -  Walking or climbing stairs? N N  Dressing or bathing? N N  Doing errands, shopping? N N  Preparing Food and eating ? N -  Using the Toilet? N -  In the past six months, have you accidently leaked urine? Y -  Do you have  problems with loss of bowel control? Y -  Managing your Medications? N -  Managing your Finances? N -  Housekeeping or managing your Housekeeping? N -  Some recent data might be hidden    Fall/Depression Screening: Fall Risk  09/13/2017 03/21/2017 09/13/2016  Falls in the past year? Yes Yes Yes  Number falls in past yr: 2 or more 2 or more 2 or more  Comment about 5 falls in the last year - back pain  Injury with Fall? No No -  Risk Factor Category  High Fall Risk - -  Risk for fall due to : History of fall(s);Impaired balance/gait;Impaired mobility;Medication side effect - -  Follow up Education provided;Falls prevention discussed - -   PHQ 2/9 Scores 09/13/2017 03/21/2017 01/18/2016 11/27/2014  PHQ - 2 Score 0 0 0 0    THN CM Care Plan Problem One     Most Recent Value  Care Plan Problem One  Knowledge deficiet related to congestive heart failure  Role Documenting the Problem One  [place] for Problem One  Active  THN Long Term Goal   Patient will verbalize no hospitalizations within the next 90 days.  THN Long Term Goal Start Date  09/13/17  Interventions for Problem One Long Term Goal  Current Care Plan discussed and reviewed with patient, Falls prevention reviewed and discussed, patient encouraged to discuss multiple falls with primary care at next medical appointment,  dicussed attending Outpatient Cardiac Rehabiliation with patient,  discussed possibly needing outpatient physical therapy for increased falls,  EMMI sent on Preventing Falls, EMMI sent on Coronary Stenting,  EMMI sent on Heart Attack,    THN CM Short Term Goal #1   Patient will review Living Better with Heart Failure Education Packet within the next 30 days.  THN CM Short Term Goal #1 Start Date  09/13/17  Interventions for Short Term Goal #1  Sending Living Better with Heart Failure Educational Packet, reviewed signs and symptoms of heart failure with patient,  patient encouraged to weigh daily,  discussed  when to call physician with increasing weights  THN CM Short Term Goal #2   Patient will verbalize 4 signs and symptoms of heart failure exacerbation in the next 30 days.  THN CM Short Term Goal #2 Start Date  09/13/17  Interventions for Short Term Goal #2  Living Better with Heart Failure Educational Packet mailed to patient, signs and symptoms of heart failure discussed and reviewed with patient, discussed medication compliance with patient, reviewed medications with patient, discussed low sodium diet with patient      Appointments:  Patient has hospital follow up appointment with Dr. Sharlet Salina, primary care provider on 09/15/2017.  He has scheduled appointment with Cardiology on 09/18/2017.  Scheduled to see Dr. Annamaria Boots, Pulmonology on 09/25/2017.  Patient encouraged to keep scheduled medical appointments.  Advanced Directives:  Patient verbalizes he has a Media planner in place and does not wish to make changes to his current advanced directive.   Consent:  [facility] reviewed and  discussed with patient.  Patient verbally agrees to [employer] monthly outreaches.  Plan: RN Health Coach to send patient Copy. RN Health Coach to send patient 2019 Calendar Booklet. RN Health Coach to send patient Living Better with Heart Failure Packet. RN Health Coach to send patient EMMI Preventing Falls. RN Health Coach to send patient EMMI Coronary Stenting. RN Health Coach to send patient EMMI Heart Attack. RN Health Coach will send primary MD barriers letter. RN Health Coach to route initial assessment to primary MD. [place] will make next monthly outreach to patient in the month of February.  [place] [phone] [email or web address]

## 2017-09-14 ENCOUNTER — Other Ambulatory Visit: Payer: Self-pay | Admitting: *Deleted

## 2017-09-14 NOTE — Patient Outreach (Signed)
[facility]) Care Management  09/14/2017  LANDYNN DUPLER 03/11/1946 [id number]  EMMI Alert Follow Up  EMMI:  Heart Failure Red on EMMI Alert EMMI Day:  #9 Date:  09/13/2016 Red Alert Reason:  New/worsening problems: Yes & Know why to take meds: No  Outreach Attempt:   Successful telephone outreach to patient for EMMI Red Flag follow up.  HIPAA confirmed.  Completed initial telephone assessment with patient on 09/13/2017.  At that time patient denied any shortness of breath or chest pain.  Medications reviewed with patient at that time also.  Outreached to patient again today for EMMI Red Flags obtained on 09/13/2017.  New/worsening problems: patient denies shortness of breath, chest pain, and states he is not having any difficulties sleeping.  Reports weight this morning is 225 pounds, and denies any medical complaints.  Know why to take medications: patient states he has no medication questions from review of medications with this RN Health Coach on 09/13/2017.  Patient reports he will remain at friends house for the next few days then plan to return home.  Plan:  Patient instructed to contact RN Health Coach with any questions or concerns.  RN Health Coach will make next monthly outreach to patient in the month of February.  [place] [phone] [email or web address]

## 2017-09-15 ENCOUNTER — Encounter: Payer: Self-pay | Admitting: Internal Medicine

## 2017-09-15 ENCOUNTER — Ambulatory Visit (INDEPENDENT_AMBULATORY_CARE_PROVIDER_SITE_OTHER): Payer: PPO | Admitting: Internal Medicine

## 2017-09-15 DIAGNOSIS — E785 Hyperlipidemia, unspecified: Secondary | ICD-10-CM | POA: Diagnosis not present

## 2017-09-15 DIAGNOSIS — I10 Essential (primary) hypertension: Secondary | ICD-10-CM | POA: Diagnosis not present

## 2017-09-15 DIAGNOSIS — I5041 Acute combined systolic (congestive) and diastolic (congestive) heart failure: Secondary | ICD-10-CM

## 2017-09-15 NOTE — Assessment & Plan Note (Signed)
No flare today and he is taking dual antiplatelet after the stent placement. Will follow up with cardiology to see if this is acute from NSTEMI or if this will be a chronic problem. EF fairly normal on the cath.

## 2017-09-15 NOTE — Assessment & Plan Note (Signed)
BP at goal on his atenolol/chlorthalidone. Recent CMP without problems in the hospital. No adjustment today.

## 2017-09-15 NOTE — Assessment & Plan Note (Signed)
Taking high potency statin atorvastatin 80 mg daily without side effects.

## 2017-09-15 NOTE — Progress Notes (Signed)
   Subjective:    Patient ID: METRO EDENFIELD, male    DOB: 12/20/1945, 72 y.o.   MRN: [id number]  HPI The patient is a 72 YO man coming in for hospital follow up (in for NSTEMI and cath with stent placement). He denies problems since leaving the hospital. He does not recall all events clearly. He was feeling poorly the morning of admission and now feels back to normal. He is able to exercise and walking 1/2 mile per day. He denies chest pains. No bleeding. No SOB or swelling. He got some compression stockings. Denies abdominal pain, constipation, diarrhea, headaches. No falls since discharge.   PMH, [facility], social history reviewed and updated.   Review of Systems  Constitutional: Negative.   HENT: Negative.   Eyes: Negative.   Respiratory: Negative for cough, chest tightness and shortness of breath.   Cardiovascular: Negative for chest pain, palpitations and leg swelling.  Gastrointestinal: Negative for abdominal distention, abdominal pain, constipation, diarrhea, nausea and vomiting.  Musculoskeletal: Negative.   Skin: Negative.   Neurological: Negative.   Psychiatric/Behavioral: Negative.       Objective:   Physical Exam  Constitutional: He is oriented to person, place, and time. He appears well-developed and well-nourished.  HENT:  Head: Normocephalic and atraumatic.  Eyes: EOM are normal.  Neck: Normal range of motion.  Cardiovascular: Normal rate and regular rhythm.  Pulmonary/Chest: Effort normal and breath sounds normal. No respiratory distress. He has no wheezes. He has no rales.  Abdominal: Soft. Bowel sounds are normal. He exhibits no distension. There is no tenderness. There is no rebound.  Musculoskeletal: He exhibits no edema.  Neurological: He is alert and oriented to person, place, and time. Coordination normal.  Skin: Skin is warm and dry.  Psychiatric: He has a normal mood and affect.   Vitals:   09/15/17 1405  BP: 110/72  Pulse: (!) 56  Temp: 97.7 F (36.5  C)  TempSrc: Oral  SpO2: 99%  Weight: 234 lb (106.1 kg)  Height: 6\' 1"  (1.854 m)      Assessment & Plan:

## 2017-09-15 NOTE — Patient Instructions (Addendum)
Dr. Irish Lack did the heart cath on you and they went in the right hand.   Keep up with the exercise to keep the heart muscles strong.   You can try a humidifier in your room at night for the throat.

## 2017-09-18 ENCOUNTER — Ambulatory Visit: Payer: PPO | Admitting: Physician Assistant

## 2017-09-18 ENCOUNTER — Encounter: Payer: Self-pay | Admitting: Physician Assistant

## 2017-09-18 VITALS — BP 124/62 | HR 53 | Ht 73.0 in | Wt 234.4 lb

## 2017-09-18 DIAGNOSIS — E785 Hyperlipidemia, unspecified: Secondary | ICD-10-CM

## 2017-09-18 DIAGNOSIS — I214 Non-ST elevation (NSTEMI) myocardial infarction: Secondary | ICD-10-CM | POA: Diagnosis not present

## 2017-09-18 DIAGNOSIS — Z9861 Coronary angioplasty status: Secondary | ICD-10-CM | POA: Diagnosis not present

## 2017-09-18 DIAGNOSIS — I10 Essential (primary) hypertension: Secondary | ICD-10-CM

## 2017-09-18 DIAGNOSIS — I2510 Atherosclerotic heart disease of native coronary artery without angina pectoris: Secondary | ICD-10-CM | POA: Diagnosis not present

## 2017-09-18 DIAGNOSIS — R001 Bradycardia, unspecified: Secondary | ICD-10-CM

## 2017-09-18 NOTE — Progress Notes (Signed)
Cardiology Office Note   Date:  09/18/2017   ID:  John Blackburn, DOB 1945-12-12, MRN [id number]  PCP:  Hoyt Koch, MD  Cardiologist: Dr. Gwenlyn Found, in hospital 08/30/2017 Rosaria Ferries, PA-C   Chief Complaint  Patient presents with  . Hospitalization Follow-up    TOC    History of Present Illness: John Blackburn is a 72 y.o. male with a history of HTN, HLD, post-op PE 2014, GERD, OA, OSA on CPAP, depression, diverticulitis  Admitted 12/26-12/31/2018 for acute D-CHF w/ EF 55% and grade 2 dd, NSTEMI w/ DES RCA & PDA and med rx for 100% CFX  [name] presents for cardiology follow up.  He has not had chest pain or SOB since d/c. He is walking about 1/2 mile/day. It was a mile/day, so he is not back to his baseline, but feels he is getting there.  He is weighing daily, his weight has been stable on his scales, 226 lbs. LE edema has improved. No orthopnea or PND. Denies DOE. He is trying to watch the sodium in his diet more carefully.   He has not had any chest pain with exertion.  He is not having any palpitations, no presyncope or syncope.  He is compliant with his medications.  He was contacted by cardiac rehab but his co-pay would be $36 per session and he cannot afford this.   Past Medical History:  Diagnosis Date  . Allergy   . 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 CAD (coronary artery disease)    08/2017 PCI/DES mRCA, RPDA, CTO pf pLCX with collaterals, normal EF  . Depression    on multiple meds. Has been seen at [facility]. and Dr. Sabra Heck is his prescriber  . Diverticulitis of colon 2000 's   treated as an outpatient  . GERD (gastroesophageal reflux disease)    UGI done August '12 - ulcer/. Dr. Ferdinand Lango, gastroenterologist in [facility].   [name] Hx of adenomatous colonic polyps   . Hyperlipidemia    last lipid panel: HDL 44, LDL 117  . Hypertension   . Peptic ulcer    in the past and just recently-August '12  .  Pulmonary emboli [facility])    noted October 2014 - treated by Dr. Linda Hedges  . skin cancer    skin CA  . Sleep apnea, primary central    wears CPAP    Past Surgical History:  Procedure Laterality Date  . ANAL RECTAL MANOMETRY N/A 11/30/2016   Procedure: ANO RECTAL MANOMETRY;  Surgeon: Mauri Pole, MD;  Location: [facility];  Service: Endoscopy;  Laterality: N/A;  . CHOLECYSTECTOMY  1990   laproscopic   . CORONARY STENT INTERVENTION N/A 09/01/2017   Procedure: CORONARY STENT INTERVENTION;  Surgeon: Jettie Booze, MD;  Location: [place] [facility];  Service: Cardiovascular;  Laterality: N/A;  . HERNIA REPAIR    . JOINT REPLACEMENT     knee  . PROSTATE SURGERY     needle biopsy's, TURP  . RIGHT/LEFT HEART CATH AND CORONARY ANGIOGRAPHY N/A 09/01/2017   Procedure: RIGHT/LEFT HEART CATH AND CORONARY ANGIOGRAPHY;  Surgeon: Jettie Booze, MD;  Location: [place] [facility];  Service: Cardiovascular;  Laterality: N/A;  . TKR bilateral  2006   Dr. Violet Baldy  . UPPER GASTROINTESTINAL ENDOSCOPY    . VASECTOMY      Current Outpatient Medications  Medication Sig Dispense Refill  . aspirin EC 81 MG EC tablet Take  1 tablet (81 mg total) by mouth daily.    [name] atenolol-chlorthalidone (TENORETIC) 50-25 MG tablet TAKE ONE TABLET BY MOUTH ONCE DAILY 90 tablet 3  . atorvastatin (LIPITOR) 80 MG tablet Take 1 tablet (80 mg total) by mouth daily at 6 PM. 90 tablet 1  . buPROPion (WELLBUTRIN XL) 300 MG 24 hr tablet TAKE 1 TABLET BY MOUTH ONCE DAILY 30 tablet 0  . clopidogrel (PLAVIX) 75 MG tablet Take 1 tablet (75 mg total) by mouth daily. 90 tablet 2  . Melatonin 1 MG/ML LIQD Take 2.5 mg by mouth at bedtime.     . nitroGLYCERIN (NITROSTAT) 0.4 MG SL tablet Place 1 tablet (0.4 mg total) under the tongue every 5 (five) minutes as needed. 25 tablet 2  . Oxymetazoline HCl (VICKS SINEX MOISTURIZING NA) Place 1 spray into the nose daily as needed (congestion).    . pantoprazole (PROTONIX) 40  MG tablet Take 1 tablet (40 mg total) by mouth daily at 6 (six) AM. 30 tablet 2  . sertraline (ZOLOFT) 50 MG tablet TAKE ONE TABLET BY MOUTH ONCE DAILY 30 tablet 2  . tetrahydrozoline 0.05 % ophthalmic solution Place 1 drop into both eyes daily as needed (dry eyes).    . Turmeric 500 MG CAPS Take by mouth.     No current facility-administered medications for this visit.     Allergies:   Cephalexin and Levofloxacin    Social History:  The patient  reports that  has never smoked. he has never used smokeless tobacco. He reports that he drinks about 0.5 oz of alcohol per week. He reports that he does not use drugs.   Family History:  The patient's family history includes Colon cancer in his cousin, cousin, father, paternal aunt, paternal uncle, paternal uncle, and sister; Heart disease in his father, mother, paternal grandfather, and paternal uncle; Hypertension in his mother; Stroke in his mother.    ROS:  Please see the history of present illness. All other systems are reviewed and negative.    PHYSICAL EXAM: VS:  BP 124/62   Pulse (!) 53   Ht 6\' 1"  (1.854 m)   Wt 234 lb 6.4 oz (106.3 kg)   BMI 30.93 kg/m  , BMI Body mass index is 30.93 kg/m. GEN: Well nourished, well developed, male in no acute distress  HEENT: normal for age  Neck: no JVD, no carotid bruit, no masses Cardiac: RRR; no murmur, no rubs, or gallops Respiratory:  clear to auscultation bilaterally, normal work of breathing GI: soft, nontender, nondistended, + BS MS: no deformity or atrophy; no edema; distal pulses are 2+ in all 4 extremities   Skin: warm and dry, no rash Neuro:  Strength and sensation are intact Psych: euthymic mood, full affect   EKG:  EKG is ordered today. The ekg ordered today demonstrates sinus bradycardia, heart rate 53, inferior T wave changes in lateral T wave flattening consistent with recent MI  TTE: 08/30/17 Study Conclusions - Left ventricle: The cavity size was mildly dilated.  Wall thickness was increased in a pattern of mild LVH. Systolic function was normal. The estimated ejection fraction was in the range of 55% to 60%. Wall motion was normal; there were no regional wall motion abnormalities. Doppler parameters are consistent with abnormal left ventricular relaxation (grade 1 diastolic dysfunction). - Aortic valve: There was trivial regurgitation. - Aortic root: The aortic root was mildly dilated. - Mitral valve: There was mild regurgitation. - Pulmonary arteries: Systolic pressure was moderately increased. PA  peak pressure: 49 mm Hg (S). Impressions: - Normal LV systolic function; mild LVH and LVE; mild diastolic dysfunction; trace AI; mildly dilated aortic root; mild MR; mild TR; moderately elevated pulmonary pressure.  Cath: 09/01/17 Conclusion    Mid RCA lesion is 80% stenosed.  A drug-eluting stent was successfully placed using a STENT SYNERGY DES 3.5X16, postdilated to 4 mm.  Post intervention, there is a 0% residual stenosis.  RPDA lesion is 85% stenosed.  A drug-eluting stent was successfully placed using a STENT SYNERGY DES 2.25X16.  Post intervention, there is a 0% residual stenosis.  Prox Cx lesion is 100% stenosed. This is a chronic total occlusion.  Prox LAD lesion is 25% stenosed.  Mid LAD lesion is 25% stenosed.  The left ventricular systolic function is normal.  LV end diastolic pressure is normal.  The left ventricular ejection fraction is 50-55% by visual estimate.  There is no aortic valve stenosis.  LV end diastolic pressure is normal.  CO 6.5 L/min; CI 2.83, PA sat 71%; Ao sat 97%; mean PCWP 10 mm Hg COntinue aggressive secondary prevention.  Consider CTO PCI of the circumflex in the future.  Likely discharge in AM. Plan for dual antiplatelet therapy for at least 12 months given NSTEMI.   Post-Intervention Diagram          Recent Labs: 08/30/2017: B Natriuretic Peptide  349.2 09/04/2017: BUN 17; Creatinine, Ser 1.01; Hemoglobin 14.0; Platelets 299; Potassium 3.6; Sodium 136    Lipid Panel    Component Value Date/Time   CHOL 166 09/01/2017 0538   TRIG 109 09/01/2017 0538   HDL 30 (L) 09/01/2017 0538   CHOLHDL 5.5 09/01/2017 0538   VLDL 22 09/01/2017 0538   LDLCALC 114 (H) 09/01/2017 0538   LDLDIRECT 64.5 03/26/2013 1628     Wt Readings from Last 3 Encounters:  09/18/17 234 lb 6.4 oz (106.3 kg)  09/15/17 234 lb (106.1 kg)  09/04/17 226 lb 3.1 oz (102.6 kg)     Other studies Reviewed: Additional studies/ records that were reviewed today include: Hospital records and testing.  ASSESSMENT AND PLAN:  1.  Non-STEMI, subsequent episode of CARE: Mr. Kilpatrick is doing well from a cardiac standpoint.  He is on appropriate medical therapy with aspirin, Plavix, beta-blocker and high-dose statin.  Continue current therapy.  He is concerned about the cost of cardiac rehab and states he cannot afford it.  I contacted cardiac rehab and they will discuss with him the possibility of getting him in for maintenance therapy which is less expensive or intermittent episodes of cardiac rehab.  2.  Sinus bradycardia: His heart rate is in the low 50s today.  His atenolol/chlorthalidone at 50/25 is a medication he has been on for a long time.  He has no history of presyncope or syncope.  Continue current therapy, contact us for symptoms.  3.  Dyslipidemia: HDL was 30 and LDL was 114 during his recent hospitalization.  Continue high dose statin and recheck labs in 3 months.  4.  Hypertension: Blood pressure is at goal on current medications.  Current medicines are reviewed at length with the patient today.  The patient does not have concerns regarding medicines.  The following changes have been made:  no change  Labs/ tests ordered today include:  No orders of the defined types were placed in this encounter.    Disposition:   FU with Dr. Gwenlyn Found  Signed, Rosaria Ferries, PA-C  09/18/2017 9:41 AM    [facility] Phone: [phone]; Fax: [phone]  This note was written with the assistance of speech recognition software. Please excuse any transcriptional errors.

## 2017-09-18 NOTE — Patient Instructions (Addendum)
NO MEDICATION CHANGES      LABS PLEASE HAVE LABWORK DONE IN 3 MONTHS  BEFORE YOU SEE DR BERRY NOTHING TO EAT OR DRINK THE MORNING OF THE LABWORK WILL MAIL LAB SLIP TO YOU  AT THE APPROPRIATE TIME CMP LIPIDS   You have been referred to  [place] -IF YOU HAVE NOT RECEIVED A PHONE CALL IN 2 WEEKS  CONTACT  CARDIAC REHAB -- [phone] - [phone]    Your physician recommends that you schedule a follow-up appointment in [place].   If you need a refill on your cardiac medications before your next appointment, please call your pharmacy.

## 2017-09-20 ENCOUNTER — Telehealth (HOSPITAL_COMMUNITY): Payer: Self-pay

## 2017-09-20 NOTE — Telephone Encounter (Signed)
Called and spoke with patient in regards to Cardiac Rehab - Patient is interested but only to attend classes on Wednesdays & Fridays. Scheduled orientation on 10/24/2017 at 1:30pm. Patient will attend the 2:45pm exc class.

## 2017-09-25 ENCOUNTER — Other Ambulatory Visit: Payer: Self-pay

## 2017-09-25 ENCOUNTER — Encounter: Payer: Self-pay | Admitting: Internal Medicine

## 2017-09-25 ENCOUNTER — Ambulatory Visit: Payer: PPO | Admitting: Internal Medicine

## 2017-09-25 VITALS — BP 114/78 | HR 56 | Ht 73.0 in | Wt 242.0 lb

## 2017-09-25 DIAGNOSIS — G4733 Obstructive sleep apnea (adult) (pediatric): Secondary | ICD-10-CM | POA: Diagnosis not present

## 2017-09-25 NOTE — Patient Outreach (Signed)
[facility]) Care Management  09/25/2017  JODECI ROARTY Aug 11, 1946 [id number]   EMMI- Heart Failure Red on EMMI Alert: Day # 20 Date:09/24/17 Red Alert Reason: New/ Worsening problems? Sad, Hopeless or empty?   Outreach attempt #1  to patient  Successfull telephone call to the patient for EMMI Red Alert. HIPAA verified.  Patient states that he made a mistake yesterday when answering the questions on the phone. He states that he is feeling fine. He denies any shortness of breath, or chest pain.  His weight this morning is 226.  He states that he is adherent with his medications. RN Health Coach advised the patient of Heart Failure Red Zone and the patient verbalized understanding.   Plan: RN Health Coach Hubert Azure will make an outreach attempt to the patient in the month of February.  Lazaro Arms RN, BSN, [place] Direct Dial:  [phone] Fax: [phone]

## 2017-09-25 NOTE — Patient Instructions (Signed)
Order- DME [facility] replacement for old CPAP machine, auto 5-20, mask of choice, humidifier, supplies, AirView     Dx OSA  Please let us know if you have more problems getting established with [facility] and getting the supplies you need.  We did mention [email or web address] and Respironics and ResMed as two common brands.  Please call us as needed

## 2017-09-25 NOTE — Progress Notes (Signed)
HPI male never smoker followed for OSA, complicated by pulmonary embolism 2014, allergic rhinitis, arthritis, BPH, peripheral venous insufficiency, GERD, HBP, CHF/CAD/ NSTEMI  CPAP titration study 04/06/17-8 CWP, PLMA NPSG 07/26/08-AHI 30/hour, desaturation to 83%, body weight 310 pounds MSLT 12/01/03-mean latency 3.0   SOREM 0/4 Echo 09/04/17-  - Normal LV systolic function; mild LVH and LVE; mild diastolic dysfunction; trace AI; mildly dilated aortic root; mild MR; mild TR; moderately elevated pulmonary pressure.   -------------------------------------------------------------------------------------------------  06/01/17- 72 year old male never smoker for sleep evaluation. Pt referredy by PCP Dr. Sharlet Salina MD. Sleep study completed, pt has CPAP currently DME- [facility]; changed recently DME to [facility]. Pt is requesting a new CPAP machine and supplies. Medical problem list includes pulmonary embolism 2014, allergic rhinitis, arthritis, BPH, peripheral venous insufficiency, GERD, HBP CPAP titration study 04/06/17-8 CWP, PLMA NPSG 07/26/08-AHI 30/hour, desaturation to 83%, body weight 310 pounds MSLT 12/01/03-mean latency 3.0   SOREM 0/4 Living alone. Has been using an old machine, more than 72 years old, with [facility]. Wants to change to [facility] and replace old machine. He describes good compliance and benefit from CPAP. Melatonin 5 mg at bedtime. No other sleep medicine. Little caffeine. Occasionally feels as if he sleeps all day, especially since he broke his right arm recently and has been wearing a cast. ENT surgery-tonsils.  09/25/17-72 year old male never smoker followed for OSA, complicated by pulmonary embolism 2014, allergic rhinitis, arthritis, BPH, peripheral venous insufficiency, GERD, HBP, CHF/CAD -------OSA; DME [facility]. Pt wears CPAP everyight;however he did not bring his machine or SD card for DL. Pt would like to have order placed for nasal pillow mask Changed from [facility] to [facility] at last  visit, but never contacted by [facility]. , still using old [facility] 09/04/17-acute CHF, NonSTEMI Breathing feels stable.  He is comfortable with CPAP and sleeps better with it but would like to change to nasal pillows mask and complete transfer at his request from [facility] to [facility].  Machine is old and we can take the opportunity to change to AutoPap.  No break in therapy.  ROS-see HPI   + = Positive Constitutional:    weight loss, night sweats, fevers, chills, fatigue, lassitude. HEENT:    headaches, difficulty swallowing, tooth/dental problems, sore throat,       sneezing, itching, ear ache, nasal congestion, post nasal drip, snoring CV:    chest pain, orthopnea, PND, swelling in lower extremities, anasarca,                                                       dizziness, palpitations Resp:   shortness of breath with exertion or at rest.                productive cough,   non-productive cough, coughing up of blood.              change in color of mucus.  wheezing.   Skin:    rash or lesions. GI:  No-   heartburn, indigestion, abdominal pain, nausea, vomiting, diarrhea,                 change in bowel habits, loss of appetite GU: dysuria, change in color of urine, no urgency or frequency.   flank pain. MS:   joint pain, stiffness, decreased range of motion, back pain. Neuro-  nothing unusual Psych:  change in mood or affect.  depression or anxiety.   memory loss.  OBJ- Physical Exam General- Alert, Oriented, Affect-appropriate, Distress- none acute Skin- rash-none, lesions- none, excoriation- none Lymphadenopathy- none Head- atraumatic            Eyes- Gross vision intact, PERRLA, conjunctivae and secretions clear            Ears- Hearing, canals-normal            Nose- Clear, no-Septal dev, mucus, polyps, erosion, perforation             Throat- Mallampati II-III , mucosa clear , drainage- none, tonsils- atrophic,  Neck- flexible , trachea midline, no stridor , thyroid nl,  carotid no bruit Chest - symmetrical excursion , unlabored           Heart/CV- RRR , no murmur , no gallop  , no rub, nl s1 s2                           - JVD- none , edema- none, stasis changes- none, varices- none           Lung- clear to P&A, wheeze- none, cough- none , dullness-none, rub- none           Chest wall-  Abd-  Br/ Gen/ Rectal- Not done, not indicated Extrem- cyanosis- none, clubbing, none, atrophy- none, strength- nl Neuro- grossly intact to observation

## 2017-09-26 NOTE — Assessment & Plan Note (Signed)
He has been compliant with CPAP without break in therapy.  He wants to complete change of DME to [facility], replace old machine with change to auto 5-20, and change his mask to nasal pillows.

## 2017-09-27 ENCOUNTER — Other Ambulatory Visit: Payer: Self-pay | Admitting: *Deleted

## 2017-09-27 DIAGNOSIS — G4733 Obstructive sleep apnea (adult) (pediatric): Secondary | ICD-10-CM | POA: Diagnosis not present

## 2017-09-27 NOTE — Patient Outreach (Signed)
[facility]) Care Management  09/27/2017  John Blackburn 01-03-1946 [id number]   EMMI Alert Follow Up  EMMI:  Heart Failure Red on EMMI Alert: EMMI Day:  #22 Date:  09/26/2017 Red Alert Reason:  Know why to take meds? No  Outreach Attempt:  Outreach attempt #1 to patient for EMMI Red Alert follow up. No answer. RN Health Coach left HIPAA compliant voicemail message along with contact information.  Plan:  RN Health Coach will make another outreach attempt within 2 business days.  [place] [phone] [email or web address]

## 2017-09-28 ENCOUNTER — Other Ambulatory Visit: Payer: Self-pay | Admitting: *Deleted

## 2017-09-28 NOTE — Patient Outreach (Signed)
[facility]) Care Management  09/28/2017  John Blackburn 01-Jan-1946 [id number]   EMMI Alert Follow Up  EMMI:  Heart Failure Red on EMMI Alert: EMMI Day:  #22 Date:  09/26/2017 Red Alert Reason:  Know why to take meds? No  Outreach Attempt:  Successful telephone outreach to patient for EMMI Red Alert follow up.  HIPAA confirmed.  Patient answered EMMI question: know why to take meds? No.  Spoke with patient.  Patient states the answer was a mistake.  He is aware of medications and knows why he takes medications.  This [place] has provided patient medication education in the recent past, as well.  Reports weight this morning is 226 pounds.  Denies any shortness of breath, chest pain, or swelling in his lower extremities and abdomen.  Plan:  RN Health Coach will make next monthly telephone outreach to patient in the month of February.  [place] [phone] [email or web address]

## 2017-09-29 ENCOUNTER — Other Ambulatory Visit: Payer: Self-pay | Admitting: *Deleted

## 2017-09-29 ENCOUNTER — Telehealth (HOSPITAL_COMMUNITY): Payer: Self-pay

## 2017-09-29 NOTE — Telephone Encounter (Signed)
Called to see if patient wanted to reschedule orientation due to a cancellation. Rescheduled orientation to 10/03/2017 at 1:30pm.

## 2017-09-29 NOTE — Patient Outreach (Signed)
[facility]) Care Management  09/29/2017  John Blackburn 10-Feb-1946 [id number]   EMMI Alert Follow Up  EMMI:  Heart Failure Red on EMMI Alert EMMI Day:  #24 Date:  09/28/2017 Red Alert Reason:  New/worsening problems? Yes   Outreach Attempt:  Patient flagged red alert for New/worsening problems ? Yes on 09/28/2017.  RN Health Coach spoke with patient on yesterday, 09/28/2017 to follow up on previous red alert flag.  At this time patient stated he was doing well.  Continues to weigh daily.  Weight yesterday was 226 pounds per patient.  Patient denied any chest pain, shortness of breath, or swelling in lower extremities and abdomen.  Plan:  RN Health Coach to make next monthly telephone outreach in the month of February.  RN Health Coach to continue to monitor EMMI flags and contact patient as necessary.  [place] Coach [phone] [email or web address]

## 2017-10-02 ENCOUNTER — Other Ambulatory Visit: Payer: Self-pay | Admitting: *Deleted

## 2017-10-02 NOTE — Patient Outreach (Signed)
[facility]) Care Management  10/02/2017  ZAKYE BABY 1946/06/19 [id number]   EMMI Alert Follow Up  EMMI:  Heart Failure Red on EMMI Alert EMMI Day:  #26 Date:  09/30/2017 Red Alert Reason:  New/worsening problems? Yes  EMMI:  Heart Failure Red on EMMI Alert EMMI Day:  #27 Date:  10/01/2017 Red Alert Reason:  Lost interest in things they used to enjoy? Yes   Outreach Attempt:  Successful telephone outreach to patient for EMMI Heart Failure Red Alert follow up.  HIPAA verified with patient.  Patient flagged red on 09/30/2017 for new/worsening problems? Yes and on 10/01/2017 for lost interest in things they used to enjoy? Yes.  Patient stating he is doing ok.  Weight this morning 226 pounds.  States he must of answered the questions wrong.  Denies any new or worsening problems and denies any feelings of sadness or depression.  Patient denies any chest pain, shortness of breath, lightheadedness/dizziness, or swelling in lower extremities or abdomen.  Denies any questions or concerns in regards to his medications.  Plan:   RN Health Coach will make next monthly outreach to patient in the month of February.  RN Health Coach will continue to monitor EMMI alerts.  [place] Coach [phone] [email or web address]

## 2017-10-03 ENCOUNTER — Encounter (HOSPITAL_COMMUNITY)
Admission: RE | Admit: 2017-10-03 | Discharge: 2017-10-03 | Disposition: A | Discharge status: Home / Self Care | Payer: PPO | Source: Ambulatory Visit | Attending: Cardiovascular Disease | Admitting: Cardiovascular Disease

## 2017-10-03 ENCOUNTER — Encounter (HOSPITAL_COMMUNITY): Payer: Self-pay

## 2017-10-03 VITALS — BP 104/56 | HR 56 | Ht 72.0 in | Wt 235.5 lb

## 2017-10-03 DIAGNOSIS — Z79899 Other long term (current) drug therapy: Secondary | ICD-10-CM | POA: Diagnosis not present

## 2017-10-03 DIAGNOSIS — I10 Essential (primary) hypertension: Secondary | ICD-10-CM | POA: Insufficient documentation

## 2017-10-03 DIAGNOSIS — Z7982 Long term (current) use of aspirin: Secondary | ICD-10-CM | POA: Insufficient documentation

## 2017-10-03 DIAGNOSIS — F329 Major depressive disorder, single episode, unspecified: Secondary | ICD-10-CM | POA: Insufficient documentation

## 2017-10-03 DIAGNOSIS — Z955 Presence of coronary angioplasty implant and graft: Secondary | ICD-10-CM | POA: Diagnosis not present

## 2017-10-03 DIAGNOSIS — I214 Non-ST elevation (NSTEMI) myocardial infarction: Secondary | ICD-10-CM | POA: Diagnosis not present

## 2017-10-03 DIAGNOSIS — Z7902 Long term (current) use of antithrombotics/antiplatelets: Secondary | ICD-10-CM | POA: Insufficient documentation

## 2017-10-03 DIAGNOSIS — K219 Gastro-esophageal reflux disease without esophagitis: Secondary | ICD-10-CM | POA: Diagnosis not present

## 2017-10-03 DIAGNOSIS — E785 Hyperlipidemia, unspecified: Secondary | ICD-10-CM | POA: Diagnosis not present

## 2017-10-03 DIAGNOSIS — I2510 Atherosclerotic heart disease of native coronary artery without angina pectoris: Secondary | ICD-10-CM | POA: Insufficient documentation

## 2017-10-03 DIAGNOSIS — G4731 Primary central sleep apnea: Secondary | ICD-10-CM | POA: Insufficient documentation

## 2017-10-03 NOTE — Progress Notes (Signed)
Cardiac Individual Treatment Plan  Patient Details  Name: John Blackburn MRN: [id number] Date of Birth: [birth year] Referring Provider:     CARDIAC REHAB PHASE II ORIENTATION from 10/03/2017 in [place]  Referring Provider  Dr Lorie Phenix       Initial Encounter Date:    CARDIAC REHAB PHASE II ORIENTATION from 10/03/2017 in [place]  Date  10/03/17  Referring Provider  Dr Lorie Phenix       Visit Diagnosis: NSTEMI (non-ST elevated myocardial infarction) (HCC)08/30/17  Status post coronary artery stent placement 09/01/17 S/P DES Mid RCA  Patient's Home Medications on Admission:  Current Outpatient Medications:  .  aspirin EC 81 MG EC tablet, Take 1 tablet (81 mg total) by mouth daily., Disp: , Rfl:  .  atenolol-chlorthalidone (TENORETIC) 50-25 MG tablet, TAKE ONE TABLET BY MOUTH ONCE DAILY, Disp: 90 tablet, Rfl: 3 .  atorvastatin (LIPITOR) 80 MG tablet, Take 1 tablet (80 mg total) by mouth daily at 6 PM., Disp: 90 tablet, Rfl: 1 .  buPROPion (WELLBUTRIN XL) 300 MG 24 hr tablet, TAKE 1 TABLET BY MOUTH ONCE DAILY, Disp: 30 tablet, Rfl: 0 .  clopidogrel (PLAVIX) 75 MG tablet, Take 1 tablet (75 mg total) by mouth daily., Disp: 90 tablet, Rfl: 2 .  Melatonin 1 MG/ML LIQD, Take 2.5 mg by mouth at bedtime. , Disp: , Rfl:  .  nitroGLYCERIN (NITROSTAT) 0.4 MG SL tablet, Place 1 tablet (0.4 mg total) under the tongue every 5 (five) minutes as needed., Disp: 25 tablet, Rfl: 2 .  Oxymetazoline HCl (VICKS SINEX MOISTURIZING NA), Place 1 spray into the nose daily as needed (congestion)., Disp: , Rfl:  .  pantoprazole (PROTONIX) 40 MG tablet, Take 1 tablet (40 mg total) by mouth daily at 6 (six) AM., Disp: 30 tablet, Rfl: 2 .  sertraline (ZOLOFT) 50 MG tablet, TAKE ONE TABLET BY MOUTH ONCE DAILY, Disp: 30 tablet, Rfl: 2 .  tetrahydrozoline 0.05 % ophthalmic solution, Place 1 drop into both eyes daily as needed (dry eyes).,  Disp: , Rfl:  .  Turmeric 500 MG CAPS, Take by mouth., Disp: , Rfl:   Past Medical History: Past Medical History:  Diagnosis Date  . Allergy   . 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 CAD (coronary artery disease)    08/2017 PCI/DES mRCA, RPDA, CTO pf pLCX with collaterals, normal EF  . Depression    on multiple meds. Has been seen at [facility]. and Dr. Sabra Heck is his prescriber  . Diverticulitis of colon 2000 's   treated as an outpatient  . GERD (gastroesophageal reflux disease)    UGI done August '12 - ulcer/. Dr. Ferdinand Lango, gastroenterologist in [facility].   [name] Hx of adenomatous colonic polyps   . Hyperlipidemia    last lipid panel: HDL 44, LDL 117  . Hypertension   . Peptic ulcer    in the past and just recently-August '12  . Pulmonary emboli [facility])    noted October 2014 - treated by Dr. Linda Hedges  . skin cancer    skin CA  . Sleep apnea, primary central    wears CPAP    Tobacco Use: Social History   Tobacco Use  Smoking Status Never Smoker  Smokeless Tobacco Never Used    Labs: Recent Review Flowsheet Data    Labs for ITP Cardiac and Pulmonary Rehab Latest Ref Rng & Units 01/17/2017 08/30/2017 09/01/2017 09/01/2017 09/01/2017  Cholestrol 0 - 200 mg/dL - - 166 - -   LDLCALC 0 - 99 mg/dL - - 114(H) - -   LDLDIRECT mg/dL - - - - -   HDL >40 mg/dL - - 30(L) - -   Trlycerides <150 mg/dL - - 109 - -   Hemoglobin A1c 4.6 - 6.5 % 5.9 - - - -   PHART 7.350 - 7.450 - - - 7.392 -   PCO2ART 32.0 - 48.0 mmHg - - - 41.5 -   HCO3 20.0 - 28.0 mmol/L - - - 25.2 26.1   TCO2 22 - 32 mmol/L - 28 - 26 27   O2SAT % - - - 97.0 71.0      Capillary Blood Glucose: No results found for: GLUCAP   Exercise Target Goals: Date: 10/03/17  Exercise Program Goal: Individual exercise prescription set using results from initial 6 min walk test and THRR while considering  patient's activity barriers and safety.   Exercise Prescription Goal: Initial exercise prescription  builds to 30-45 minutes a day of aerobic activity, 2-3 days per week.  Home exercise guidelines will be given to patient during program as part of exercise prescription that the participant will acknowledge.  Activity Barriers & Risk Stratification: Activity Barriers & Cardiac Risk Stratification - 10/03/17 1457      Activity Barriers & Cardiac Risk Stratification   Activity Barriers  Right Knee Replacement;Left Knee Replacement;Arthritis;Muscular Weakness;Balance Concerns;Assistive Device;History of Falls       6 Minute Walk: 6 Minute Walk    Row Name 10/03/17 1441 10/03/17 1457       6 Minute Walk   Phase  Initial  -    Distance  897 feet  -    Walk Time  6 minutes  -    # of Rest Breaks  0  -    MPH  1.7  -    METS  1.56  -    RPE  13  -    VO2 Peak  5.5  -    Symptoms  No  -    Resting HR  56 bpm  -    Resting BP  104/56  -    Resting Oxygen Saturation   96 %  -    Exercise Oxygen Saturation  during 6 min walk  94 % encouraged PLB, O2 increased to 96%  -    Max Ex. HR  66 bpm  -    Max Ex. BP  124/74  -    2 Minute Post BP  -  104/60       Oxygen Initial Assessment:   Oxygen Re-Evaluation:   Oxygen Discharge (Final Oxygen Re-Evaluation):   Initial Exercise Prescription: Initial Exercise Prescription - 10/03/17 1500      Date of Initial Exercise RX and Referring Provider   Date  10/03/17    Referring Provider  Dr Lorie Phenix       NuStep   Level  1    SPM  50    Minutes  10    METs  1      Arm Ergometer   Level  1    RPM  10    Minutes  10    METs  1      Track   Laps  6    Minutes  10    METs  2.03      Prescription Details   Frequency (times per week)  3  Duration  Progress to 30 minutes of continuous aerobic without signs/symptoms of physical distress      Intensity   THRR 40-80% of Max Heartrate  60-119    Ratings of Perceived Exertion  11-15    Perceived Dyspnea  0-4      Progression   Progression  Continue to progress  workloads to maintain intensity without signs/symptoms of physical distress.      Resistance Training   Training Prescription  Yes    Weight  1lb    Reps  10-15       Perform Capillary Blood Glucose checks as needed.  Exercise Prescription Changes:   Exercise Comments:   Exercise Goals and Review: Exercise Goals    Row Name 10/03/17 1357             Exercise Goals   Increase Physical Activity  Yes       Intervention  Provide advice, education, support and counseling about physical activity/exercise needs.;Develop an individualized exercise prescription for aerobic and resistive training based on initial evaluation findings, risk stratification, comorbidities and participant's personal goals.       Expected Outcomes  Short Term: Attend rehab on a regular basis to increase amount of physical activity.;Long Term: Exercising regularly at least 3-5 days a week.;Long Term: Add in home exercise to make exercise part of routine and to increase amount of physical activity.       Increase Strength and Stamina  Yes be able to walk 1 mile       Intervention  Provide advice, education, support and counseling about physical activity/exercise needs.;Develop an individualized exercise prescription for aerobic and resistive training based on initial evaluation findings, risk stratification, comorbidities and participant's personal goals.       Expected Outcomes  Short Term: Increase workloads from initial exercise prescription for resistance, speed, and METs.;Short Term: Perform resistance training exercises routinely during rehab and add in resistance training at home;Long Term: Improve cardiorespiratory fitness, muscular endurance and strength as measured by increased METs and functional capacity (6MWT)       Able to understand and use rate of perceived exertion (RPE) scale  Yes       Intervention  Provide education and explanation on how to use RPE scale       Expected Outcomes  Short Term: Able  to use RPE daily in rehab to express subjective intensity level;Long Term:  Able to use RPE to guide intensity level when exercising independently       Knowledge and understanding of Target Heart Rate Range (THRR)  Yes       Intervention  Provide education and explanation of THRR including how the numbers were predicted and where they are located for reference       Expected Outcomes  Short Term: Able to state/look up THRR;Long Term: Able to use THRR to govern intensity when exercising independently;Short Term: Able to use daily as guideline for intensity in rehab       Able to check pulse independently  Yes       Intervention  Provide education and demonstration on how to check pulse in carotid and radial arteries.;Review the importance of being able to check your own pulse for safety during independent exercise       Expected Outcomes  Short Term: Able to explain why pulse checking is important during independent exercise;Long Term: Able to check pulse independently and accurately       Understanding of Exercise Prescription  Yes  Intervention  Provide education, explanation, and written materials on patient's individual exercise prescription       Expected Outcomes  Short Term: Able to explain program exercise prescription;Long Term: Able to explain home exercise prescription to exercise independently          Exercise Goals Re-Evaluation :    Discharge Exercise Prescription (Final Exercise Prescription Changes):   Nutrition:  Target Goals: Understanding of nutrition guidelines, daily intake of sodium 1500mg , cholesterol 200mg , calories 30% from fat and 7% or less from saturated fats, daily to have 5 or more servings of fruits and vegetables.  Biometrics: Pre Biometrics - 10/03/17 1442      Pre Biometrics   Height  6' (1.829 m)    Weight  235 lb 7.2 oz (106.8 kg)    Waist Circumference  42.5 inches    Hip Circumference  44.5 inches    Waist to Hip Ratio  0.96 %    BMI  (Calculated)  31.93    Triceps Skinfold  18 mm    % Body Fat  30.7 %    Grip Strength  27 kg    Flexibility  9 in    Single Leg Stand  1 seconds        Nutrition Therapy Plan and Nutrition Goals:   Nutrition Assessments:   Nutrition Goals Re-Evaluation:   Nutrition Goals Re-Evaluation:   Nutrition Goals Discharge (Final Nutrition Goals Re-Evaluation):   Psychosocial: Target Goals: Acknowledge presence or absence of significant depression and/or stress, maximize coping skills, provide positive support system. Participant is able to verbalize types and ability to use techniques and skills needed for reducing stress and depression.  Initial Review & Psychosocial Screening: Initial Psych Review & Screening - 10/03/17 1545      Initial Review   Current issues with  History of Depression      Family Dynamics   Good Support System?  Yes    Concerns  -- Lorenda Ishihara says he went through a bad divorce 14 years ago      Barriers   Psychosocial barriers to participate in program  The patient should benefit from training in stress management and relaxation.      Screening Interventions   Interventions  Encouraged to exercise;To provide support and resources with identified psychosocial needs    Expected Outcomes  Long Term Goal: Stressors or current issues are controlled or eliminated.;Short Term goal: Identification and review with participant of any Quality of Life or Depression concerns found by scoring the questionnaire.;Long Term goal: The participant improves quality of Life and PHQ9 Scores as seen by post scores and/or verbalization of changes       Quality of Life Scores:  Scores of 19 and below usually indicate a poorer quality of life in these areas.  A difference of  2-3 points is a clinically meaningful difference.  A difference of 2-3 points in the total score of the Quality of Life Index has been associated with significant improvement in overall quality of life,  self-image, physical symptoms, and general health in studies assessing change in quality of life.  PHQ-9: Recent Review Flowsheet Data    Depression screen [facility] 2/9 10/03/2017 10/03/2017 09/13/2017 03/21/2017 01/18/2016   Decreased Interest (No Data)  0 0 0 0   Down, Depressed, Hopeless - 0 0 0 0   PHQ - 2 Score - 0 0 0 0     Interpretation of Total Score  Total Score Depression Severity:  1-4 = Minimal depression, 5-9 =  Mild depression, 10-14 = Moderate depression, 15-19 = Moderately severe depression, 20-27 = Severe depression   Psychosocial Evaluation and Intervention:   Psychosocial Re-Evaluation:   Psychosocial Discharge (Final Psychosocial Re-Evaluation):   Vocational Rehabilitation: Provide vocational rehab assistance to qualifying candidates.   Vocational Rehab Evaluation & Intervention: Vocational Rehab - 10/03/17 1543      Initial Vocational Rehab Evaluation & Intervention   Assessment shows need for Vocational Rehabilitation  Yes Lorenda Ishihara is retired but says he is interested in vocational rehab for job retraining     Vocational Rehab Packet given to patient  10/03/17       Education: Education Goals: Education classes will be provided on a weekly basis, covering required topics. Participant will state understanding/return demonstration of topics presented.  Learning Barriers/Preferences: Learning Barriers/Preferences - 10/03/17 1459      Learning Barriers/Preferences   Learning Barriers  Hearing;Sight       Education Topics: Count Your Pulse:  -Group instruction provided by verbal instruction, demonstration, patient participation and written materials to support subject.  Instructors address importance of being able to find your pulse and how to count your pulse when at home without a heart monitor.  Patients get hands on experience counting their pulse with staff help and individually.   Heart Attack, Angina, and Risk Factor Modification:  -Group instruction  provided by verbal instruction, video, and written materials to support subject.  Instructors address signs and symptoms of angina and heart attacks.    Also discuss risk factors for heart disease and how to make changes to improve heart health risk factors.   Functional Fitness:  -Group instruction provided by verbal instruction, demonstration, patient participation, and written materials to support subject.  Instructors address safety measures for doing things around the house.  Discuss how to get up and down off the floor, how to pick things up properly, how to safely get out of a chair without assistance, and balance training.   Meditation and Mindfulness:  -Group instruction provided by verbal instruction, patient participation, and written materials to support subject.  Instructor addresses importance of mindfulness and meditation practice to help reduce stress and improve awareness.  Instructor also leads participants through a meditation exercise.    Stretching for Flexibility and Mobility:  -Group instruction provided by verbal instruction, patient participation, and written materials to support subject.  Instructors lead participants through series of stretches that are designed to increase flexibility thus improving mobility.  These stretches are additional exercise for major muscle groups that are typically performed during regular warm up and cool down.   Hands Only CPR:  -Group verbal, video, and participation provides a basic overview of AHA guidelines for community CPR. Role-play of emergencies allow participants the opportunity to practice calling for help and chest compression technique with discussion of AED use.   Hypertension: -Group verbal and written instruction that provides a basic overview of hypertension including the most recent diagnostic guidelines, risk factor reduction with self-care instructions and medication management.    Nutrition I class: Heart Healthy  Eating:  -Group instruction provided by PowerPoint slides, verbal discussion, and written materials to support subject matter. The instructor gives an explanation and review of the Therapeutic Lifestyle Changes diet recommendations, which includes a discussion on lipid goals, dietary fat, sodium, fiber, plant stanol/sterol esters, sugar, and the components of a well-balanced, healthy diet.   Nutrition II class: Lifestyle Skills:  -Group instruction provided by PowerPoint slides, verbal discussion, and written materials to support subject matter. The instructor  gives an explanation and review of label reading, grocery shopping for heart health, heart healthy recipe modifications, and ways to make healthier choices when eating out.   Diabetes Question & Answer:  -Group instruction provided by PowerPoint slides, verbal discussion, and written materials to support subject matter. The instructor gives an explanation and review of diabetes co-morbidities, pre- and post-prandial blood glucose goals, pre-exercise blood glucose goals, signs, symptoms, and treatment of hypoglycemia and hyperglycemia, and foot care basics.   Diabetes Blitz:  -Group instruction provided by PowerPoint slides, verbal discussion, and written materials to support subject matter. The instructor gives an explanation and review of the physiology behind type 1 and type 2 diabetes, diabetes medications and rational behind using different medications, pre- and post-prandial blood glucose recommendations and Hemoglobin A1c goals, diabetes diet, and exercise including blood glucose guidelines for exercising safely.    Portion Distortion:  -Group instruction provided by PowerPoint slides, verbal discussion, written materials, and food models to support subject matter. The instructor gives an explanation of serving size versus portion size, changes in portions sizes over the last 20 years, and what consists of a serving from each food  group.   Stress Management:  -Group instruction provided by verbal instruction, video, and written materials to support subject matter.  Instructors review role of stress in heart disease and how to cope with stress positively.     Exercising on Your Own:  -Group instruction provided by verbal instruction, power point, and written materials to support subject.  Instructors discuss benefits of exercise, components of exercise, frequency and intensity of exercise, and end points for exercise.  Also discuss use of nitroglycerin and activating EMS.  Review options of places to exercise outside of rehab.  Review guidelines for sex with heart disease.   Cardiac Drugs I:  -Group instruction provided by verbal instruction and written materials to support subject.  Instructor reviews cardiac drug classes: antiplatelets, anticoagulants, beta blockers, and statins.  Instructor discusses reasons, side effects, and lifestyle considerations for each drug class.   Cardiac Drugs II:  -Group instruction provided by verbal instruction and written materials to support subject.  Instructor reviews cardiac drug classes: angiotensin converting enzyme inhibitors (ACE-I), angiotensin II receptor blockers (ARBs), nitrates, and calcium channel blockers.  Instructor discusses reasons, side effects, and lifestyle considerations for each drug class.   Anatomy and Physiology of the Circulatory System:  Group verbal and written instruction and models provide basic cardiac anatomy and physiology, with the coronary electrical and arterial systems. Review of: AMI, Angina, Valve disease, Heart Failure, Peripheral Artery Disease, Cardiac Arrhythmia, Pacemakers, and the ICD.   Other Education:  -Group or individual verbal, written, or video instructions that support the educational goals of the cardiac rehab program.   Holiday Eating Survival Tips:  -Group instruction provided by PowerPoint slides, verbal discussion, and  written materials to support subject matter. The instructor gives patients tips, tricks, and techniques to help them not only survive but enjoy the holidays despite the onslaught of food that accompanies the holidays.   Knowledge Questionnaire Score: Knowledge Questionnaire Score - 10/03/17 1459      Knowledge Questionnaire Score   Pre Score  17/24       Core Components/Risk Factors/Patient Goals at Admission: Personal Goals and Risk Factors at Admission - 10/03/17 1457      Core Components/Risk Factors/Patient Goals on Admission    Weight Management  Yes;Obesity;Weight Maintenance    Intervention  Weight Management: Develop a combined nutrition and exercise program designed  to reach desired caloric intake, while maintaining appropriate intake of nutrient and fiber, sodium and fats, and appropriate energy expenditure required for the weight goal.;Weight Management/Obesity: Establish reasonable short term and long term weight goals.;Obesity: Provide education and appropriate resources to help participant work on and attain dietary goals.;Weight Management: Provide education and appropriate resources to help participant work on and attain dietary goals.    Admit Weight  235 lb 7.2 oz (106.8 kg)    Goal Weight: Short Term  230 lb (104.3 kg)    Goal Weight: Long Term  225 lb (102.1 kg)    Expected Outcomes  Short Term: Continue to assess and modify interventions until short term weight is achieved;Long Term: Adherence to nutrition and physical activity/exercise program aimed toward attainment of established weight goal;Weight Loss: Understanding of general recommendations for a balanced deficit meal plan, which promotes 1-2 lb weight loss per week and includes a negative energy balance of [phone] kcal/d;Weight Maintenance: Understanding of the daily nutrition guidelines, which includes 25-35% calories from fat, 7% or less cal from saturated fats, less than 200mg  cholesterol, less than 1.5gm of  sodium, & 5 or more servings of fruits and vegetables daily;Understanding recommendations for meals to include 15-35% energy as protein, 25-35% energy from fat, 35-60% energy from carbohydrates, less than 200mg  of dietary cholesterol, 20-35 gm of total fiber daily;Understanding of distribution of calorie intake throughout the day with the consumption of 4-5 meals/snacks    Hypertension  Yes    Intervention  Provide education on lifestyle modifcations including regular physical activity/exercise, weight management, moderate sodium restriction and increased consumption of fresh fruit, vegetables, and low fat dairy, alcohol moderation, and smoking cessation.;Monitor prescription use compliance.    Expected Outcomes  Short Term: Continued assessment and intervention until BP is < 140/41mm HG in hypertensive participants. < 130/59mm HG in hypertensive participants with diabetes, heart failure or chronic kidney disease.;Long Term: Maintenance of blood pressure at goal levels.       Core Components/Risk Factors/Patient Goals Review:    Core Components/Risk Factors/Patient Goals at Discharge (Final Review):    ITP Comments: ITP Comments    Row Name 10/03/17 1346           ITP Comments  Dr. Fransico Him, Medical Director          Comments: John Blackburn attended orientation from 1341 to 1454 to review rules and guidelines for program. Completed 6 minute walk test, Intitial ITP, and exercise prescription.  VSS. Telemetry-Sinus Rhtyhm. Asymptomatic. John Blackburn used a rolling walker for stability as he is slightly deconditioned.Barnet Pall, RN,BSN 10/03/2017 4:05 PM

## 2017-10-04 NOTE — Progress Notes (Signed)
John Blackburn 72 y.o. male DOB Jun 09, 1946 MRN [id number]       Nutrition  1. NSTEMI (non-ST elevated myocardial infarction) (HCC)08/30/17   2. Status post coronary artery stent placement 09/01/17 S/P DES Mid RCA    Past Medical History:  Diagnosis Date  . Allergy   . 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 CAD (coronary artery disease)    08/2017 PCI/DES mRCA, RPDA, CTO pf pLCX with collaterals, normal EF  . Depression    on multiple meds. Has been seen at [facility]. and Dr. Sabra Heck is his prescriber  . Diverticulitis of colon 2000 's   treated as an outpatient  . GERD (gastroesophageal reflux disease)    UGI done August '12 - ulcer/. Dr. Ferdinand Lango, gastroenterologist in [facility].   [name] Hx of adenomatous colonic polyps   . Hyperlipidemia    last lipid panel: HDL 44, LDL 117  . Hypertension   . Peptic ulcer    in the past and just recently-August '12  . Pulmonary emboli [facility])    noted October 2014 - treated by Dr. Linda Hedges  . skin cancer    skin CA  . Sleep apnea, primary central    wears CPAP   Meds reviewed.   HT: Ht Readings from Last 1 Encounters:  10/03/17 6' (1.829 m)    WT: Wt Readings from Last 3 Encounters:  10/03/17 235 lb 7.2 oz (106.8 kg)  09/25/17 242 lb (109.8 kg)  09/18/17 234 lb 6.4 oz (106.3 kg)     BMI 31.9   Current tobacco use? No       Labs:  Lipid Panel     Component Value Date/Time   CHOL 166 09/01/2017 0538   TRIG 109 09/01/2017 0538   HDL 30 (L) 09/01/2017 0538   CHOLHDL 5.5 09/01/2017 0538   VLDL 22 09/01/2017 0538   LDLCALC 114 (H) 09/01/2017 0538   LDLDIRECT 64.5 03/26/2013 1628    Lab Results  Component Value Date   HGBA1C 5.9 01/17/2017   CBG (last 3)  No results for input(s): GLUCAP in the last 72 hours.  Nutrition Diagnosis ? Food-and nutrition-related knowledge deficit related to lack of exposure to information as related to diagnosis of: ? CVD ? Pre-DM ? Obesity related to excessive energy intake  as evidenced by a BMI of 31.9  Nutrition Goal(s):  ? To be determined with pt.  Plan:  Pt to attend nutrition classes ? Nutrition I ? Nutrition II ? Portion Distortion  Will provide client-centered nutrition education as part of interdisciplinary care.   Monitor and evaluate progress toward nutrition goal with team.  Derek Mound, M.Ed, RD, LDN, CDE 10/04/2017 11:55 AM

## 2017-10-09 ENCOUNTER — Other Ambulatory Visit: Payer: Self-pay | Admitting: *Deleted

## 2017-10-09 NOTE — Patient Outreach (Signed)
[facility]) Care Management  10/09/2017  John Blackburn Oct 04, 1945 [id number]   EMMI Alert Follow Up  EMMI:  Heart Failure Red on EMMI Alert EMMI Day:  #32 Date:  10/06/2017 Red Alert Reason:  New/worsening problem? Yes and New Swelling? Yes  Outreach Attempt:  Successful telephone outreach to patient for EMMI Red Alert follow up.  Patient answered yes to new/worsening problems and new swelling on 10/06/2017.  Spoke with patient.  HIPAA confirmed with patient.  Patient states those responses where a mistake.  Denies new/worsening problems or swelling in abdomen or lower extremities.  Also, denies any chest pain or shortness of breath.  States current weight is 226 pounds, in his daily range.  Verbalizes he has began with Outpatient Cardiac Rehab.  Does state he has a slight productive cough but feels it is more of a slight cold.  States he can lie flat without difficulties.  Verbalized he has an appointment with his primary care office 10/12/2017.   Plan:  RN Health Coach to make next monthly telephone outreach to patient in the month of February.  [place] [phone] [name]

## 2017-10-10 ENCOUNTER — Other Ambulatory Visit: Payer: Self-pay | Admitting: Internal Medicine

## 2017-10-10 DIAGNOSIS — F329 Major depressive disorder, single episode, unspecified: Secondary | ICD-10-CM

## 2017-10-10 DIAGNOSIS — F32A Depression, unspecified: Secondary | ICD-10-CM

## 2017-10-11 ENCOUNTER — Encounter (HOSPITAL_COMMUNITY): Payer: PPO

## 2017-10-11 NOTE — Progress Notes (Unsigned)
Subjective:   John Blackburn is a 72 y.o. male who presents for Medicare Annual/Subsequent preventive examination.  Review of Systems:  No ROS.  Medicare Wellness Visit. Additional risk factors are reflected in the social history.    Sleep patterns: {SX; SLEEP PATTERNS:18802::"feels rested on waking","does not get up to void","gets up *** times nightly to void","sleeps *** hours nightly"}.   Home Safety/Smoke Alarms: Feels safe in home. Smoke alarms in place.  Living environment; residence and Firearm Safety: {Rehab home environment / accessibility:30080::"no firearms","firearms stored safely"}. Seat Belt Safety/Bike Helmet: Wears seat belt.    Objective:    Vitals: There were no vitals taken for this visit.  There is no height or weight on file to calculate BMI.  Advanced Directives 10/03/2017 09/13/2017 08/30/2017 04/06/2017 02/14/2017 06/03/2016 10/13/2015  Does Patient Have a Medical Advance Directive? Yes Yes Yes Yes Yes No Yes  Type of [place] administrator Power of [place];Living will Living will - -  Does patient want to make changes to medical advance directive? No - Patient declined No - Patient declined No - Patient declined No - Patient declined No - Patient declined - -  Copy of [place] in Chart? No - copy requested No - copy requested No - copy requested No - copy requested - - -  Would patient like information on creating a medical advance directive? - - - - - Yes - Educational materials given -  Pre-existing out of facility DNR order (yellow form or pink MOST form) - - - - - - -    Tobacco Social History   Tobacco Use  Smoking Status Never Smoker  Smokeless Tobacco Never Used     Counseling given: Not Answered   Past Medical History:  Diagnosis Date  . Allergy   . 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 CAD (coronary artery disease)      08/2017 PCI/DES mRCA, RPDA, CTO pf pLCX with collaterals, normal EF  . Depression    on multiple meds. Has been seen at [facility]. and Dr. Sabra Heck is his prescriber  . Diverticulitis of colon 2000 's   treated as an outpatient  . GERD (gastroesophageal reflux disease)    UGI done August '12 - ulcer/. Dr. Ferdinand Lango, gastroenterologist in [facility].   [name] Hx of adenomatous colonic polyps   . Hyperlipidemia    last lipid panel: HDL 44, LDL 117  . Hypertension   . Peptic ulcer    in the past and just recently-August '12  . Pulmonary emboli [facility])    noted October 2014 - treated by Dr. Linda Hedges  . skin cancer    skin CA  . Sleep apnea, primary central    wears CPAP   Past Surgical History:  Procedure Laterality Date  . ANAL RECTAL MANOMETRY N/A 11/30/2016   Procedure: ANO RECTAL MANOMETRY;  Surgeon: Mauri Pole, MD;  Location: [facility];  Service: Endoscopy;  Laterality: N/A;  . CARDIAC CATHETERIZATION    . CHOLECYSTECTOMY  1990   laproscopic   . CORONARY STENT INTERVENTION N/A 09/01/2017   Procedure: CORONARY STENT INTERVENTION;  Surgeon: Jettie Booze, MD;  Location: [facility];  Service: Cardiovascular;  Laterality: N/A;  . HERNIA REPAIR    . JOINT REPLACEMENT     knee  . PROSTATE SURGERY     needle biopsy's, TURP  . RIGHT/LEFT HEART  CATH AND CORONARY ANGIOGRAPHY N/A 09/01/2017   Procedure: RIGHT/LEFT HEART CATH AND CORONARY ANGIOGRAPHY;  Surgeon: Jettie Booze, MD;  Location: [place] [facility];  Service: Cardiovascular;  Laterality: N/A;  . TKR bilateral  2006   Dr. Violet Baldy  . UPPER GASTROINTESTINAL ENDOSCOPY    . VASECTOMY     Family History  Problem Relation Age of Onset  . Stroke Mother   . Hypertension Mother   . Heart disease Mother   . Heart disease Father        CAD/MI-fatal sudden death  . Colon cancer Father   . Colon cancer Sister        colon cancer 20090-survivor  . Heart disease Paternal Uncle   . Colon cancer  Paternal Uncle   . Heart disease Paternal Grandfather   . Colon cancer Paternal Aunt   . Colon cancer Cousin   . Colon cancer Cousin   . Colon cancer Paternal Uncle   . Stomach cancer Neg Hx   . Rectal cancer Neg Hx   . Esophageal cancer Neg Hx    Social History   Socioeconomic History  . Marital status: Divorced    Spouse name: Not on file  . Number of children: 3  . Years of education: 71  . Highest education level: Not on file  Social Needs  . Financial resource strain: Somewhat hard  . Food insecurity - worry: Never true  . Food insecurity - inability: Sometimes true  . Transportation needs - medical: No  . Transportation needs - non-medical: No  Occupational History  . Occupation: Chief Strategy Officer- retired  Tobacco Use  . Smoking status: Never Smoker  . Smokeless tobacco: Never Used  Substance and Sexual Activity  . Alcohol use: Yes    Alcohol/week: 0.5 oz    Types: 1 Standard drinks or equivalent per week    Comment: social  . Drug use: No  . Sexual activity: Not Currently    Partners: Female  Other Topics Concern  . 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 3 sons - '74, '76, '87.  1 grandchild.   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 No pets. Serially monogamous.   ACP - he has a living will: DNR, DNI.    Outpatient Encounter Medications as of 10/12/2017  Medication Sig  . aspirin EC 81 MG EC tablet Take 1 tablet (81 mg total) by mouth daily.  [name] atenolol-chlorthalidone (TENORETIC) 50-25 MG tablet TAKE ONE TABLET BY MOUTH ONCE DAILY  . atorvastatin (LIPITOR) 80 MG tablet Take 1 tablet (80 mg total) by mouth daily at 6 PM.  . buPROPion (WELLBUTRIN XL) 300 MG 24 hr tablet TAKE 1 TABLET BY MOUTH ONCE DAILY  . clopidogrel (PLAVIX) 75 MG tablet Take 1 tablet (75 mg total) by mouth daily.  . Melatonin 1 MG/ML LIQD Take 2.5 mg by mouth at bedtime.   . nitroGLYCERIN (NITROSTAT) 0.4 MG SL  tablet Place 1 tablet (0.4 mg total) under the tongue every 5 (five) minutes as needed.  . Oxymetazoline HCl (VICKS SINEX MOISTURIZING NA) Place 1 spray into the nose daily as needed (congestion).  . pantoprazole (PROTONIX) 40 MG tablet Take 1 tablet (40 mg total) by mouth daily at 6 (six) AM.  . sertraline (ZOLOFT) 50 MG tablet TAKE ONE TABLET BY MOUTH ONCE DAILY  . tetrahydrozoline 0.05 % ophthalmic solution Place 1 drop into both eyes daily as needed (dry eyes).  [name]  Turmeric 500 MG CAPS Take by mouth.   No facility-administered encounter medications on file as of 10/12/2017.     Activities of Daily Living In your present state of health, do you have any difficulty performing the following activities: 09/13/2017 08/30/2017  Hearing? Y N  Comment "a tiny bit" -  Vision? N N  Difficulty concentrating or making decisions? Y N  Comment sometimes difficulty remembering things -  Walking or climbing stairs? N N  Dressing or bathing? N N  Doing errands, shopping? N N  Preparing Food and eating ? N -  Using the Toilet? N -  In the past six months, have you accidently leaked urine? Y -  Do you have problems with loss of bowel control? Y -  Managing your Medications? N -  Managing your Finances? N -  Housekeeping or managing your Housekeeping? N -  Some recent data might be hidden    Patient Care Team: Hoyt Koch, MD as PCP - General (Internal Medicine) Jettie Booze, MD as PCP - Cardiology (Cardiology) Kathie Rhodes, MD as Attending Physician (Urology) Leona Singleton, RN as [place] Management   Assessment:   This is a routine wellness examination for John Blackburn. Physical assessment deferred to PCP.    Exercise Activities and Dietary recommendations   Diet (meal preparation, eat out, water intake, caffeinated beverages, dairy products, fruits and vegetables): {Desc; diets:16563}    Goals      Patient Stated   . patient (pt-stated)     Will  un-clutter the apt; will spend 30 minutes every day cleaning bedroom;  Consider a buddy;        Fall Risk Fall Risk  10/03/2017 09/13/2017 03/21/2017 09/13/2016 01/18/2016  Falls in the past year? Yes Yes Yes Yes Yes  Number falls in past yr: 2 or more 2 or more 2 or more 2 or more 2 or more  Comment - about 5 falls in the last year - back pain -  Injury with Fall? Yes No No - No  Comment bruises/sratches - - - -  Risk Factor Category  - High Fall Risk - - -  Risk for fall due to : - History of fall(s);Impaired balance/gait;Impaired mobility;Medication side effect - - -  Follow up - Education provided;Falls prevention discussed - - -    Depression Screen PHQ 2/9 Scores 10/03/2017 09/13/2017 03/21/2017 01/18/2016  PHQ - 2 Score 0 0 0 0    Cognitive Function MMSE - Mini Mental State Exam 10/13/2015  Not completed: (No Data)        Immunization History  Administered Date(s) Administered  . Influenza Whole 08/13/2012  . Influenza, High Dose Seasonal PF 06/01/2013, 07/13/2017  . Influenza,inj,Quad PF,6+ Mos 06/19/2014, 05/26/2015  . Influenza-Unspecified 07/21/2016  . Pneumococcal Conjugate-13 04/01/2016  . Pneumococcal Polysaccharide-23 05/15/2012  . Td 05/15/2012    Screening Tests Health Maintenance  Topic Date Due  . COLONOSCOPY  10/25/2021  . TETANUS/TDAP  05/15/2022  . INFLUENZA VACCINE  Completed  . Hepatitis C Screening  Completed  . PNA vac Low Risk Adult  Completed      Plan:     I have personally reviewed and noted the following in the patient's chart:   . Medical and social history . Use of alcohol, tobacco or illicit drugs  . Current medications and supplements . Functional ability and status . Nutritional status . Physical activity . Advanced directives . List of other physicians . Vitals . Screenings to  include cognitive, depression, and falls . Referrals and appointments  In addition, I have reviewed and discussed with patient certain preventive  protocols, quality metrics, and best practice recommendations. A written personalized care plan for preventive services as well as general preventive health recommendations were provided to patient.     Michiel Cowboy, RN  10/11/2017

## 2017-10-12 ENCOUNTER — Ambulatory Visit: Payer: PPO

## 2017-10-13 ENCOUNTER — Encounter (HOSPITAL_COMMUNITY)
Admission: RE | Admit: 2017-10-13 | Discharge: 2017-10-13 | Disposition: A | Discharge status: Home / Self Care | Payer: PPO | Source: Ambulatory Visit | Attending: Cardiovascular Disease | Admitting: Cardiovascular Disease

## 2017-10-13 DIAGNOSIS — Z7982 Long term (current) use of aspirin: Secondary | ICD-10-CM | POA: Insufficient documentation

## 2017-10-13 DIAGNOSIS — F329 Major depressive disorder, single episode, unspecified: Secondary | ICD-10-CM | POA: Insufficient documentation

## 2017-10-13 DIAGNOSIS — I2510 Atherosclerotic heart disease of native coronary artery without angina pectoris: Secondary | ICD-10-CM | POA: Diagnosis not present

## 2017-10-13 DIAGNOSIS — Z955 Presence of coronary angioplasty implant and graft: Secondary | ICD-10-CM | POA: Insufficient documentation

## 2017-10-13 DIAGNOSIS — Z7902 Long term (current) use of antithrombotics/antiplatelets: Secondary | ICD-10-CM | POA: Diagnosis not present

## 2017-10-13 DIAGNOSIS — Z79899 Other long term (current) drug therapy: Secondary | ICD-10-CM | POA: Insufficient documentation

## 2017-10-13 DIAGNOSIS — I10 Essential (primary) hypertension: Secondary | ICD-10-CM | POA: Insufficient documentation

## 2017-10-13 DIAGNOSIS — E785 Hyperlipidemia, unspecified: Secondary | ICD-10-CM | POA: Insufficient documentation

## 2017-10-13 DIAGNOSIS — K219 Gastro-esophageal reflux disease without esophagitis: Secondary | ICD-10-CM | POA: Diagnosis not present

## 2017-10-13 DIAGNOSIS — I214 Non-ST elevation (NSTEMI) myocardial infarction: Secondary | ICD-10-CM | POA: Insufficient documentation

## 2017-10-13 DIAGNOSIS — G4731 Primary central sleep apnea: Secondary | ICD-10-CM | POA: Insufficient documentation

## 2017-10-13 NOTE — Progress Notes (Signed)
Daily Session Note  Patient Details  Name: John Blackburn MRN: [id number] Date of Birth: 1946/06/16 Referring Provider:     CARDIAC REHAB PHASE II ORIENTATION from 10/03/2017 in [place]  Referring Provider  Dr Lorie Phenix       Encounter Date: 10/13/2017  Check In: Session Check In - 10/13/17 1511      Check-In   Location  [facility]    Staff Present  Barnet Pall, RN, Mosie Epstein, MS,ACSM CEP, Exercise Physiologist;Molly diVincenzo, MS, ACSM RCEP, Exercise Physiologist;Carlette Wilber Oliphant, RN, BSN    Supervising physician immediately available to respond to emergencies  [facility] immediately available    Physician(s)  Dr. Denton Brick    Medication changes reported      No    Fall or balance concerns reported     No    Tobacco Cessation  No Change    Warm-up and Cool-down  Performed as group-led instruction    Resistance Training Performed  Yes    VAD Patient?  No      Pain Assessment   Currently in Pain?  No/denies    Multiple Pain Sites  No       Capillary Blood Glucose: No results found for this or any previous visit (from the past 24 hour(s)).    Social History   Tobacco Use  Smoking Status Never Smoker  Smokeless Tobacco Never Used    Goals Met:  Exercise tolerated well  Goals Unmet:  Not Applicable  Comments: Pt started cardiac rehab today.  Pt tolerated light exercise without difficulty. VSS, telemetry-Sinus Rhtyhm, asymptomatic.  Medication list reconciled. Pt denies barriers to medicaiton compliance.  PSYCHOSOCIAL ASSESSMENT:  PHQ-0. Pt exhibits positive coping skills, hopeful outlook with supportive family. No psychosocial needs identified at this time, no psychosocial interventions necessary.    Pt enjoys volunteering with hospice.   Pt oriented to exercise equipment and routine.    Understanding verbalized. Barnet Pall, RN,BSN 10/13/2017 4:23 PM   Dr. Fransico Him is Medical Director  for Cardiac Rehab at [facility].

## 2017-10-16 ENCOUNTER — Encounter: Payer: Self-pay | Admitting: *Deleted

## 2017-10-16 ENCOUNTER — Other Ambulatory Visit: Payer: Self-pay | Admitting: *Deleted

## 2017-10-16 NOTE — Patient Outreach (Signed)
[facility]) Care Management  10/16/2017  John Blackburn Oct 26, 1945 [id number]   [place] Monthly Outreach  Referral Date:  09/07/2017 Referral Source:  EMMI Alert Screen Reason for Referral:  Disease Management Education Insurance:  [employer]  Outreach Attempt:  Successful telephone outreach to patient for monthly follow up.  HIPAA verified with patient.  Patient stating he is feeling better.  States he has had a head cold and feels it is starting to clear up.  Patient reports he continues to weigh daily.  This mornings weight is 227 pounds, in his normal range.  Denies and swelling in his legs, feet, or abdomen.  Verbalizes he has begun Outpatient Cardiac Rehabilitation.  Denies any current chest pain or shortness of breath.  Medications reviewed with patient and he states his understanding.  Patient reporting he has not received information mailed out in January.  Patient's address confirmed and information to be re mailed.  Appointments:  Patient states he saw his primary care provider, Dr. Sharlet Blackburn on 09/15/2017  And has follow up appointment with her office on 10/17/2017.  Reports he has had his post hospital appointment with Cardiology on 1/14/20019 and follows up with them on 10/19/2017.  Plan: RN Health Coach to resend patient Copy. RN Health Coach to resend patient 2019 Calendar Booklet. RN Health Coach to resend patient Living Better with Heart Failure Packet. RN Health Coach to resend patient EMMI Preventing Falls. RN Health Coach to resend patient EMMI Coronary Stenting. RN Health Coach to resend patient EMMI Heart Attack. RN Health Coach will make next monthly outreach to patient in the month of March.   [place] [phone] [email or web address]

## 2017-10-16 NOTE — Progress Notes (Signed)
Subjective:   John Blackburn is a 72 y.o. male who presents for Medicare Annual/Subsequent preventive examination.  Patient states that he has felt increasingly depressed lately. Patient states he does at times feel bad about himself but denies having feelings of wanting to harm himself. Patient did feel it would be a good idea to discuss this with PCP.   Review of Systems:   No ROS.  Medicare Wellness Visit. Additional risk factors are reflected in the social history.    Sleep patterns: feels rested on waking, gets up 1 times nightly to void and sleeps 7-8 hours nightly.   Home Safety/Smoke Alarms: Feels safe in home. Smoke alarms in place.  Living environment; residence and Firearm Safety: 1-story house/ trailer, no firearms. Lives with alone no needs for DME, good support system Seat Belt Safety/Bike Helmet: Wears seat belt.     Objective:    Vitals: There were no vitals taken for this visit.  There is no height or weight on file to calculate BMI.  Advanced Directives 10/03/2017 09/13/2017 08/30/2017 04/06/2017 02/14/2017 06/03/2016 10/13/2015  Does Patient Have a Medical Advance Directive? Yes Yes Yes Yes Yes No Yes  Type of [place] administrator Power of [place];Living will Living will - -  Does patient want to make changes to medical advance directive? No - Patient declined No - Patient declined No - Patient declined No - Patient declined No - Patient declined - -  Copy of [place] in Chart? No - copy requested No - copy requested No - copy requested No - copy requested - - -  Would patient like information on creating a medical advance directive? - - - - - Yes - Educational materials given -  Pre-existing out of facility DNR order (yellow form or pink MOST form) - - - - - - -    Tobacco Social History   Tobacco Use  Smoking Status Never Smoker  Smokeless Tobacco  Never Used     Counseling given: Not Answered   Past Medical History:  Diagnosis Date  . Allergy   . 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 CAD (coronary artery disease)    08/2017 PCI/DES mRCA, RPDA, CTO pf pLCX with collaterals, normal EF  . Depression    on multiple meds. Has been seen at [facility]. and Dr. Sabra Heck is his prescriber  . Diverticulitis of colon 2000 's   treated as an outpatient  . GERD (gastroesophageal reflux disease)    UGI done August '12 - ulcer/. Dr. Ferdinand Lango, gastroenterologist in [facility].   [name] Hx of adenomatous colonic polyps   . Hyperlipidemia    last lipid panel: HDL 44, LDL 117  . Hypertension   . Peptic ulcer    in the past and just recently-August '12  . Pulmonary emboli [facility])    noted October 2014 - treated by Dr. Linda Hedges  . skin cancer    skin CA  . Sleep apnea, primary central    wears CPAP   Past Surgical History:  Procedure Laterality Date  . ANAL RECTAL MANOMETRY N/A 11/30/2016   Procedure: ANO RECTAL MANOMETRY;  Surgeon: Mauri Pole, MD;  Location: [facility];  Service: Endoscopy;  Laterality: N/A;  . CARDIAC CATHETERIZATION    . CHOLECYSTECTOMY  1990   laproscopic   . CORONARY STENT INTERVENTION N/A 09/01/2017   Procedure: CORONARY STENT INTERVENTION;  Surgeon: Jettie Booze, MD;  Location: [facility];  Service: Cardiovascular;  Laterality: N/A;  . HERNIA REPAIR    . JOINT REPLACEMENT     knee  . PROSTATE SURGERY     needle biopsy's, TURP  . RIGHT/LEFT HEART CATH AND CORONARY ANGIOGRAPHY N/A 09/01/2017   Procedure: RIGHT/LEFT HEART CATH AND CORONARY ANGIOGRAPHY;  Surgeon: Jettie Booze, MD;  Location: [facility];  Service: Cardiovascular;  Laterality: N/A;  . TKR bilateral  2006   Dr. Violet Baldy  . UPPER GASTROINTESTINAL ENDOSCOPY    . VASECTOMY     Family History  Problem Relation Age of Onset  . Stroke Mother   . Hypertension Mother   . Heart disease Mother   .  Heart disease Father        CAD/MI-fatal sudden death  . Colon cancer Father   . Colon cancer Sister        colon cancer [id number]-survivor  . Heart disease Paternal Uncle   . Colon cancer Paternal Uncle   . Heart disease Paternal Grandfather   . Colon cancer Paternal Aunt   . Colon cancer Cousin   . Colon cancer Cousin   . Colon cancer Paternal Uncle   . Stomach cancer Neg Hx   . Rectal cancer Neg Hx   . Esophageal cancer Neg Hx    Social History   Socioeconomic History  . Marital status: Divorced    Spouse name: Not on file  . Number of children: 3  . Years of education: 73  . Highest education level: Not on file  Social Needs  . Financial resource strain: Somewhat hard  . Food insecurity - worry: Never true  . Food insecurity - inability: Sometimes true  . Transportation needs - medical: No  . Transportation needs - non-medical: No  Occupational History  . Occupation: Chief Strategy Officer- retired  Tobacco Use  . Smoking status: Never Smoker  . Smokeless tobacco: Never Used  Substance and Sexual Activity  . Alcohol use: Yes    Alcohol/week: 0.5 oz    Types: 1 Standard drinks or equivalent per week    Comment: social  . Drug use: No  . Sexual activity: Not Currently    Partners: Female  Other Topics Concern  . 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 3 sons - '74, '76, '87.  1 grandchild.   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 No pets. Serially monogamous.   ACP - he has a living will: DNR, DNI.    Outpatient Encounter Medications as of 10/17/2017  Medication Sig  . aspirin EC 81 MG EC tablet Take 1 tablet (81 mg total) by mouth daily.  [name] atenolol-chlorthalidone (TENORETIC) 50-25 MG tablet TAKE ONE TABLET BY MOUTH ONCE DAILY  . atorvastatin (LIPITOR) 80 MG tablet Take 1 tablet (80 mg total) by mouth daily at 6 PM.  . buPROPion (WELLBUTRIN XL) 300 MG 24 hr tablet TAKE 1 TABLET BY MOUTH  ONCE DAILY  . clopidogrel (PLAVIX) 75 MG tablet Take 1 tablet (75 mg total) by mouth daily.  . Melatonin 1 MG/ML LIQD Take 2.5 mg by mouth at bedtime.   . nitroGLYCERIN (NITROSTAT) 0.4 MG SL tablet Place 1 tablet (0.4 mg total) under the tongue every 5 (five) minutes as needed.  . Oxymetazoline HCl (VICKS SINEX MOISTURIZING NA) Place 1 spray into the nose daily as needed (congestion).  . pantoprazole (PROTONIX) 40  MG tablet Take 1 tablet (40 mg total) by mouth daily at 6 (six) AM.  . sertraline (ZOLOFT) 50 MG tablet TAKE ONE TABLET BY MOUTH ONCE DAILY  . tetrahydrozoline 0.05 % ophthalmic solution Place 1 drop into both eyes daily as needed (dry eyes).  . Turmeric 500 MG CAPS Take by mouth.   No facility-administered encounter medications on file as of 10/17/2017.     Activities of Daily Living In your present state of health, do you have any difficulty performing the following activities: 09/13/2017 08/30/2017  Hearing? Y N  Comment "a tiny bit" -  Vision? N N  Difficulty concentrating or making decisions? Y N  Comment sometimes difficulty remembering things -  Walking or climbing stairs? N N  Dressing or bathing? N N  Doing errands, shopping? N N  Preparing Food and eating ? N -  Using the Toilet? N -  In the past six months, have you accidently leaked urine? Y -  Do you have problems with loss of bowel control? Y -  Managing your Medications? N -  Managing your Finances? N -  Housekeeping or managing your Housekeeping? N -  Some recent data might be hidden    Patient Care Team: Hoyt Koch, MD as PCP - General (Internal Medicine) Jettie Booze, MD as PCP - Cardiology (Cardiology) Kathie Rhodes, MD as Attending Physician (Urology) Leona Singleton, RN as [place] [facility]   Assessment:   This is a routine wellness examination for Kalin. Physical assessment deferred to PCP.  Exercise Activities and Dietary recommendations   Diet  (meal preparation, eat out, water intake, caffeinated beverages, dairy products, fruits and vegetables): in general, a "healthy" diet  , well balanced   Reviewed heart healthy diet, encouraged patient to increase daily water intake. Diet education was attached to patient's AVS.    Goals      Patient Stated   . patient (pt-stated)     Will un-clutter the apt; will spend 30 minutes every day cleaning bedroom;  Consider a buddy;        Fall Risk Fall Risk  10/03/2017 09/13/2017 03/21/2017 09/13/2016 01/18/2016  Falls in the past year? Yes Yes Yes Yes Yes  Number falls in past yr: 2 or more 2 or more 2 or more 2 or more 2 or more  Comment - about 5 falls in the last year - back pain -  Injury with Fall? Yes No No - No  Comment bruises/sratches - - - -  Risk Factor Category  - High Fall Risk - - -  Risk for fall due to : - History of fall(s);Impaired balance/gait;Impaired mobility;Medication side effect - - -  Follow up - Education provided;Falls prevention discussed - - -    Depression Screen PHQ 2/9 Scores 10/03/2017 09/13/2017 03/21/2017 01/18/2016  PHQ - 2 Score 0 0 0 0    Cognitive Function MMSE - Mini Mental State Exam 10/13/2015  Not completed: (No Data)       Ad8 score reviewed for issues:  Issues making decisions: no  Less interest in hobbies / activities: no  Repeats questions, stories (family complaining): no  Trouble using ordinary gadgets (microwave, computer, phone):no  Forgets the month or year: no  Mismanaging finances: no  Remembering appts: no  Daily problems with thinking and/or memory: no Ad8 score is= 0  Immunization History  Administered Date(s) Administered  . Influenza Whole 08/13/2012  . Influenza, High Dose Seasonal PF 06/01/2013, 07/13/2017  .  Influenza,inj,Quad PF,6+ Mos 06/19/2014, 05/26/2015  . Influenza-Unspecified 07/21/2016  . Pneumococcal Conjugate-13 04/01/2016  . Pneumococcal Polysaccharide-23 05/15/2012  . Td 05/15/2012   Screening  Tests Health Maintenance  Topic Date Due  . COLONOSCOPY  10/25/2021  . TETANUS/TDAP  05/15/2022  . INFLUENZA VACCINE  Completed  . Hepatitis C Screening  Completed  . PNA vac Low Risk Adult  Completed      Plan:    PCP appointment scheduled for 10/20/17 to discuss increased feelings of depression.   Nurse will send patient information via email for [place] free counseling services.  Nurse will also contact [facility] who is currently involved with patient's health care to inform them of patient's social financial hardships.   Continue doing brain stimulating activities (puzzles, reading, adult coloring books, staying active) to keep memory sharp.   Continue to eat heart healthy diet (full of fruits, vegetables, whole grains, lean protein, water--limit salt, fat, and sugar intake) and increase physical activity as tolerated.  I have personally reviewed and noted the following in the patient's chart:   . Medical and social history . Use of alcohol, tobacco or illicit drugs  . Current medications and supplements . Functional ability and status . Nutritional status . Physical activity . Advanced directives . List of other physicians . Vitals . Screenings to include cognitive, depression, and falls . Referrals and appointments  In addition, I have reviewed and discussed with patient certain preventive protocols, quality metrics, and best practice recommendations. A written personalized care plan for preventive services as well as general preventive health recommendations were provided to patient.     Michiel Cowboy, RN  10/16/2017

## 2017-10-17 ENCOUNTER — Ambulatory Visit (INDEPENDENT_AMBULATORY_CARE_PROVIDER_SITE_OTHER): Payer: PPO | Admitting: *Deleted

## 2017-10-17 VITALS — BP 128/58 | HR 49 | Resp 18 | Ht 73.0 in | Wt 237.0 lb

## 2017-10-17 DIAGNOSIS — Z0000 Encounter for general adult medical examination without abnormal findings: Secondary | ICD-10-CM

## 2017-10-17 NOTE — Progress Notes (Signed)
Medical screening examination/treatment/procedure(s) were performed by non-physician practitioner and as supervising physician I was immediately available for consultation/collaboration. I agree with above. Siomara Burkel A Jalessa Peyser, MD 

## 2017-10-17 NOTE — Patient Instructions (Addendum)
Continue doing brain stimulating activities (puzzles, reading, adult coloring books, staying active) to keep memory sharp.   Continue to eat heart healthy diet (full of fruits, vegetables, whole grains, lean protein, water--limit salt, fat, and sugar intake) and increase physical activity as tolerated.   John Blackburn , Thank you for taking time to come for your Medicare Wellness Visit. I appreciate your ongoing commitment to your health goals. Please review the following plan we discussed and let me know if I can assist you in the future.   These are the goals we discussed: Goals      Patient Stated   . patient (pt-stated)     Will un-clutter the apt; will spend 30 minutes every day cleaning bedroom;  Consider a buddy;       Other   . Patient Stated     Improve my heart condition by starting cardiac rehab and eating a heart healthy diet. 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       This is a list of the screening recommended for you and due dates:  Health Maintenance  Topic Date Due  . Colon Cancer Screening  10/25/2021  . Tetanus Vaccine  05/15/2022  . Flu Shot  Completed  .  Hepatitis C: One time screening is recommended by Center for Disease Control  (CDC) for  adults born from [birth year] through 1965.   Completed  . Pneumonia vaccines  Completed     Heart-Healthy Eating Plan Heart-healthy meal planning includes:  Limiting unhealthy fats.  Increasing healthy fats.  Making other small dietary changes.  You may need to talk with your doctor or a diet specialist (dietitian) to create an eating plan that is right for you. What types of fat should I choose?  Choose healthy fats. These include olive oil and canola oil, flaxseeds, walnuts, almonds, and seeds.  Eat more omega-3 fats. These include salmon, mackerel, sardines, tuna, flaxseed oil, and ground flaxseeds. Try to eat fish at least twice each week.  Limit saturated fats. ? Saturated fats are often  found in animal products, such as meats, butter, and cream. ? Plant sources of saturated fats include palm oil, palm kernel oil, and coconut oil.  Avoid foods with partially hydrogenated oils in them. These include stick margarine, some tub margarines, cookies, crackers, and other baked goods. These contain trans fats. What general guidelines do I need to follow?  Check food labels carefully. Identify foods with trans fats or high amounts of saturated fat.  Fill one half of your plate with vegetables and green salads. Eat 4-5 servings of vegetables per day. A serving of vegetables is: ? 1 cup of raw leafy vegetables. ?  cup of raw or cooked cut-up vegetables. ?  cup of vegetable juice.  Fill one fourth of your plate with whole grains. Look for the word "whole" as the first word in the ingredient list.  Fill one fourth of your plate with lean protein foods.  Eat 4-5 servings of fruit per day. A serving of fruit is: ? One medium whole fruit. ?  cup of dried fruit. ?  cup of fresh, frozen, or canned fruit. ?  cup of 100% fruit juice.  Eat more foods that contain soluble fiber. These include apples, broccoli, carrots, beans, peas, and barley. Try to get 20-30 g of fiber per day.  Eat more home-cooked food. Eat less restaurant, buffet, and fast food.  Limit or avoid alcohol.  Limit foods high in starch  and sugar.  Avoid fried foods.  Avoid frying your food. Try baking, boiling, grilling, or broiling it instead. You can also reduce fat by: ? Removing the skin from poultry. ? Removing all visible fats from meats. ? Skimming the fat off of stews, soups, and gravies before serving them. ? Steaming vegetables in water or broth.  Lose weight if you are overweight.  Eat 4-5 servings of nuts, legumes, and seeds per week: ? One serving of dried beans or legumes equals  cup after being cooked. ? One serving of nuts equals 1 ounces. ? One serving of seeds equals  ounce or one  tablespoon.  You may need to keep track of how much salt or sodium you eat. This is especially true if you have high blood pressure. Talk with your doctor or dietitian to get more information. What foods can I eat? Grains Breads, including [place], white, pita, wheat, raisin, rye, oatmeal, and [place]. Tortillas that are neither fried nor made with lard or trans fat. Low-fat rolls, including hotdog and hamburger buns and English muffins. Biscuits. Muffins. Waffles. Pancakes. Light popcorn. Whole-grain cereals. Flatbread. Melba toast. Pretzels. Breadsticks. Rusks. Low-fat snacks. Low-fat crackers, including oyster, saltine, matzo, graham, animal, and rye. Rice and pasta, including brown rice and pastas that are made with whole wheat. Vegetables All vegetables. Fruits All fruits, but limit coconut. Meats and Other Protein Sources Lean, well-trimmed beef, veal, pork, and lamb. Chicken and [place] without skin. All fish and shellfish. Wild duck, rabbit, pheasant, and venison. Egg whites or low-cholesterol egg substitutes. Dried beans, peas, lentils, and tofu. Seeds and most nuts. Dairy Low-fat or nonfat cheeses, including ricotta, string, and mozzarella. Skim or 1% milk that is liquid, powdered, or evaporated. Buttermilk that is made with low-fat milk. Nonfat or low-fat yogurt. Beverages Mineral water. Diet carbonated beverages. Sweets and Desserts Sherbets and fruit ices. Honey, jam, marmalade, jelly, and syrups. Meringues and gelatins. Pure sugar candy, such as hard candy, jelly beans, gumdrops, mints, marshmallows, and small amounts of dark chocolate. [employer] Eat all sweets and desserts in moderation. Fats and Oils Nonhydrogenated (trans-free) margarines. Vegetable oils, including soybean, sesame, sunflower, olive, peanut, safflower, corn, canola, and cottonseed. Salad dressings or mayonnaise made with a vegetable oil. Limit added fats and oils that you use for cooking, baking, salads, and  as spreads. Other Cocoa powder. Coffee and tea. All seasonings and condiments. The items listed above may not be a complete list of recommended foods or beverages. Contact your dietitian for more options. What foods are not recommended? Grains Breads that are made with saturated or trans fats, oils, or whole milk. Croissants. Butter rolls. Cheese breads. Sweet rolls. Donuts. Buttered popcorn. Chow mein noodles. High-fat crackers, such as cheese or butter crackers. Meats and Other Protein Sources Fatty meats, such as hotdogs, short ribs, sausage, spareribs, bacon, rib eye roast or steak, and mutton. High-fat deli meats, such as salami and bologna. Caviar. Domestic duck and goose. Organ meats, such as kidney, liver, sweetbreads, and heart. Dairy Cream, sour cream, cream cheese, and creamed cottage cheese. Whole-milk cheeses, including blue (bleu), Monte[place] Jack, [place], [place], American, [place], Swiss, cheddar, [place], and [place]. Whole or 2% milk that is liquid, evaporated, or condensed. Whole buttermilk. Cream sauce or high-fat cheese sauce. Yogurt that is made from whole milk. Beverages Regular sodas and juice drinks with added sugar. Sweets and Desserts Frosting. Pudding. Cookies. Cakes other than angel food cake. Candy that has milk chocolate or white chocolate, hydrogenated fat, butter, coconut,  or unknown ingredients. Buttered syrups. Full-fat ice cream or ice cream drinks. Fats and Oils Gravy that has suet, meat fat, or shortening. Cocoa butter, hydrogenated oils, palm oil, coconut oil, palm kernel oil. These can often be found in baked products, candy, fried foods, nondairy creamers, and whipped toppings. Solid fats and shortenings, including bacon fat, salt pork, lard, and butter. Nondairy cream substitutes, such as coffee creamers and sour cream substitutes. Salad dressings that are made of unknown oils, cheese, or sour cream. The items listed above may not be a complete list of foods  and beverages to avoid. Contact your dietitian for more information. This information is not intended to replace advice given to you by your health care provider. Make sure you discuss any questions you have with your health care provider. Document Released: 02/21/2012 Document Revised: 01/28/2016 Document Reviewed: 02/13/2014 Elsevier Interactive Patient Education  2018 [email or web address] or down load app on smart phone  Aunt Berenice Primas website lists multiple social resources for individuals such as: food, health, money, house hold goods, transit, medical supplies, job training and legal services.   DASH Eating Plan DASH stands for "Dietary Approaches to Stop Hypertension." The DASH eating plan is a healthy eating plan that has been shown to reduce high blood pressure (hypertension). It may also reduce your risk for type 2 diabetes, heart disease, and stroke. The DASH eating plan may also help with weight loss. What are tips for following this plan? General guidelines  Avoid eating more than 2,300 mg (milligrams) of salt (sodium) a day. If you have hypertension, you may need to reduce your sodium intake to 1,500 mg a day.  Limit alcohol intake to no more than 1 drink a day for nonpregnant women and 2 drinks a day for men. One drink equals 12 oz of beer, 5 oz of wine, or 1 oz of hard liquor.  Work with your health care provider to maintain a healthy body weight or to lose weight. Ask what an ideal weight is for you.  Get at least 30 minutes of exercise that causes your heart to beat faster (aerobic exercise) most days of the week. Activities may include walking, swimming, or biking.  Work with your health care provider or diet and nutrition specialist (dietitian) to adjust your eating plan to your individual calorie needs. Reading food labels  Check food labels for the amount of sodium per serving. Choose foods with less than 5 percent of the Daily Value of sodium. Generally,  foods with less than 300 mg of sodium per serving fit into this eating plan.  To find whole grains, look for the word "whole" as the first word in the ingredient list. Shopping  Buy products labeled as "low-sodium" or "no salt added."  Buy fresh foods. Avoid canned foods and premade or frozen meals. Cooking  Avoid adding salt when cooking. Use salt-free seasonings or herbs instead of table salt or sea salt. Check with your health care provider or pharmacist before using salt substitutes.  Do not fry foods. Cook foods using healthy methods such as baking, boiling, grilling, and broiling instead.  Cook with heart-healthy oils, such as olive, canola, soybean, or sunflower oil. Meal planning   Eat a balanced diet that includes: ? 5 or more servings of fruits and vegetables each day. At each meal, try to fill half of your plate with fruits and vegetables. ? Up to 6-8 servings of whole grains each day. ? Less than 6 oz of lean meat,  poultry, or fish each day. A 3-oz serving of meat is about the same size as a deck of cards. One egg equals 1 oz. ? 2 servings of low-fat dairy each day. ? A serving of nuts, seeds, or beans 5 times each week. ? Heart-healthy fats. Healthy fats called Omega-3 fatty acids are found in foods such as flaxseeds and coldwater fish, like sardines, salmon, and mackerel.  Limit how much you eat of the following: ? Canned or prepackaged foods. ? Food that is high in trans fat, such as fried foods. ? Food that is high in saturated fat, such as fatty meat. ? Sweets, desserts, sugary drinks, and other foods with added sugar. ? Full-fat dairy products.  Do not salt foods before eating.  Try to eat at least 2 vegetarian meals each week.  Eat more home-cooked food and less restaurant, buffet, and fast food.  When eating at a restaurant, ask that your food be prepared with less salt or no salt, if possible. What foods are recommended? The items listed may not be a  complete list. Talk with your dietitian about what dietary choices are best for you. Grains Whole-grain or whole-wheat bread. Whole-grain or whole-wheat pasta. Brown rice. [place]. Bulgur. Whole-grain and low-sodium cereals. Pita bread. Low-fat, low-sodium crackers. Whole-wheat flour tortillas. Vegetables Fresh or frozen vegetables (raw, steamed, roasted, or grilled). Low-sodium or reduced-sodium tomato and vegetable juice. Low-sodium or reduced-sodium tomato sauce and tomato paste. Low-sodium or reduced-sodium canned vegetables. Fruits All fresh, dried, or frozen fruit. Canned fruit in natural juice (without added sugar). Meat and other protein foods Skinless chicken or [place]. Ground chicken or [place]. Pork with fat trimmed off. Fish and seafood. Egg whites. Dried beans, peas, or lentils. Unsalted nuts, nut butters, and seeds. Unsalted canned beans. Lean cuts of beef with fat trimmed off. Low-sodium, lean deli meat. Dairy Low-fat (1%) or fat-free (skim) milk. Fat-free, low-fat, or reduced-fat cheeses. Nonfat, low-sodium ricotta or cottage cheese. Low-fat or nonfat yogurt. Low-fat, low-sodium cheese. Fats and oils Soft margarine without trans fats. Vegetable oil. Low-fat, reduced-fat, or light mayonnaise and salad dressings (reduced-sodium). Canola, safflower, olive, soybean, and sunflower oils. Avocado. Seasoning and other foods Herbs. Spices. Seasoning mixes without salt. Unsalted popcorn and pretzels. Fat-free sweets. What foods are not recommended? The items listed may not be a complete list. Talk with your dietitian about what dietary choices are best for you. Grains Baked goods made with fat, such as croissants, muffins, or some breads. Dry pasta or rice meal packs. Vegetables Creamed or fried vegetables. Vegetables in a cheese sauce. Regular canned vegetables (not low-sodium or reduced-sodium). Regular canned tomato sauce and paste (not low-sodium or reduced-sodium). Regular  tomato and vegetable juice (not low-sodium or reduced-sodium). Angie Fava. Olives. Fruits Canned fruit in a light or heavy syrup. Fried fruit. Fruit in cream or butter sauce. Meat and other protein foods Fatty cuts of meat. Ribs. Fried meat. Berniece Salines. Sausage. Bologna and other processed lunch meats. Salami. Fatback. Hotdogs. Bratwurst. Salted nuts and seeds. Canned beans with added salt. Canned or smoked fish. Whole eggs or egg yolks. Chicken or [place] with skin. Dairy Whole or 2% milk, cream, and half-and-half. Whole or full-fat cream cheese. Whole-fat or sweetened yogurt. Full-fat cheese. Nondairy creamers. Whipped toppings. Processed cheese and cheese spreads. Fats and oils Butter. Stick margarine. Lard. Shortening. Ghee. Bacon fat. Tropical oils, such as coconut, palm kernel, or palm oil. Seasoning and other foods Salted popcorn and pretzels. Onion salt, garlic salt, seasoned salt, table salt, and  sea salt. Worcestershire sauce. Tartar sauce. Barbecue sauce. Teriyaki sauce. Soy sauce, including reduced-sodium. Steak sauce. Canned and packaged gravies. Fish sauce. Oyster sauce. Cocktail sauce. Horseradish that you find on the shelf. Ketchup. Mustard. Meat flavorings and tenderizers. Bouillon cubes. Hot sauce and Tabasco sauce. Premade or packaged marinades. Premade or packaged taco seasonings. Relishes. Regular salad dressings. Where to find more information:  National Heart, Lung, and [place]: https://wilson-eaton.com/  American Heart Association: www.heart.org Summary  The DASH eating plan is a healthy eating plan that has been shown to reduce high blood pressure (hypertension). It may also reduce your risk for type 2 diabetes, heart disease, and stroke.  With the DASH eating plan, you should limit salt (sodium) intake to 2,300 mg a day. If you have hypertension, you may need to reduce your sodium intake to 1,500 mg a day.  When on the DASH eating plan, aim to eat more fresh fruits and  vegetables, whole grains, lean proteins, low-fat dairy, and heart-healthy fats.  Work with your health care provider or diet and nutrition specialist (dietitian) to adjust your eating plan to your individual calorie needs. This information is not intended to replace advice given to you by your health care provider. Make sure you discuss any questions you have with your health care provider. Document Released: 08/11/2011 Document Revised: 08/15/2016 Document Reviewed: 08/15/2016 Elsevier Interactive Patient Education  [employer].

## 2017-10-18 ENCOUNTER — Encounter: Payer: Self-pay | Admitting: *Deleted

## 2017-10-18 ENCOUNTER — Encounter (HOSPITAL_COMMUNITY): Payer: PPO

## 2017-10-18 ENCOUNTER — Other Ambulatory Visit: Payer: Self-pay | Admitting: *Deleted

## 2017-10-18 NOTE — Patient Outreach (Addendum)
[facility]) Care Management  10/18/2017  John Blackburn [birth year] [id number]   EMMI Alert Follow Up  EMMI:  Heart Failure Red on EMMI Alert EMMI Day: #43 Date:  10/17/2017 Red Alert Reason:  Know why to take meds? No   Outreach Attempt:  Successful telephone outreach to patient for EMMI Red Alert and follow up on information shared from [employer].  HIPAA verified with patient.  On 10/17/2017 patient answered yes to Know why to take meds.  Patient stated he is unsure of the reasoning for some of the new medications he has been prescribed.  RN Health Coach reviewed each medication with patient and the indications for each medication.  Patient stated his understanding.  RN Health Coach received email message from Health Coach at [place] office stating patient is having trouble affording medications, paying essential bills, and having increasing depression.  [facility] [place] has provided patient resource for free counseling at [facility] in [place].  Discussed financial and depression concerns with patient.  Patient states there is not one particular prescription he is having trouble affording, it is the combination of prescriptions once they are added up that he is having difficulty with.  Patient also acknowledges his increasing depression.  Reviewed and discussed [place] [facility] and [place] referrals with patient.  Patient verbally agrees for both referrals.  Patient also requesting his latest lipid panel.  RN Health Coach reviewed last lipid panel results with patient and encouraged him to discuss results and goals with his physician.  Appointments:  Patient now has appointment with primary care provider, Dr. Sharlet Salina on 10/20/2017 to discuss depression.  Plan: RN Health Coach will place [place] referral for medication assistance and medication review. RN Health Coach will place [facility] Social Work referral for depression  assistance and possible community resources for financial assistance. RN Health Coach will update [place] at [facility] office. RN Health Coach will continue to monitor EMMI Red Alerts. RN Health Coach will make next monthly telephone outreach to patient in the month of March.  [place] [phone] [email or web address]

## 2017-10-19 ENCOUNTER — Telehealth: Payer: Self-pay | Admitting: Pharmacist

## 2017-10-19 ENCOUNTER — Other Ambulatory Visit: Payer: Self-pay | Admitting: Internal Medicine

## 2017-10-19 DIAGNOSIS — Z955 Presence of coronary angioplasty implant and graft: Secondary | ICD-10-CM | POA: Diagnosis not present

## 2017-10-19 DIAGNOSIS — E7849 Other hyperlipidemia: Secondary | ICD-10-CM | POA: Diagnosis not present

## 2017-10-19 DIAGNOSIS — I119 Hypertensive heart disease without heart failure: Secondary | ICD-10-CM | POA: Diagnosis not present

## 2017-10-19 DIAGNOSIS — I2510 Atherosclerotic heart disease of native coronary artery without angina pectoris: Secondary | ICD-10-CM | POA: Diagnosis not present

## 2017-10-19 DIAGNOSIS — G4733 Obstructive sleep apnea (adult) (pediatric): Secondary | ICD-10-CM | POA: Diagnosis not present

## 2017-10-19 DIAGNOSIS — E668 Other obesity: Secondary | ICD-10-CM | POA: Diagnosis not present

## 2017-10-19 DIAGNOSIS — R609 Edema, unspecified: Secondary | ICD-10-CM | POA: Diagnosis not present

## 2017-10-19 DIAGNOSIS — Z8711 Personal history of peptic ulcer disease: Secondary | ICD-10-CM | POA: Diagnosis not present

## 2017-10-19 DIAGNOSIS — I872 Venous insufficiency (chronic) (peripheral): Secondary | ICD-10-CM | POA: Diagnosis not present

## 2017-10-19 DIAGNOSIS — Z86711 Personal history of pulmonary embolism: Secondary | ICD-10-CM | POA: Diagnosis not present

## 2017-10-19 DIAGNOSIS — Z8249 Family history of ischemic heart disease and other diseases of the circulatory system: Secondary | ICD-10-CM | POA: Diagnosis not present

## 2017-10-19 NOTE — Patient Outreach (Signed)
[facility]) Care Management  10/19/2017  CAMARA ROSANDER 1946/01/25 [id number]   Patient was called regarding medication assistance and management per referral. Unfortunately, the patient did not answer the phone. HIPAA compliant message was left on his voicemail.  Plan: Call patient back tomorrow per protocol.  Elayne Guerin, PharmD, [place] Clinical Pharmacist [phone]

## 2017-10-20 ENCOUNTER — Ambulatory Visit: Payer: PPO | Admitting: Internal Medicine

## 2017-10-20 ENCOUNTER — Other Ambulatory Visit: Payer: Self-pay | Admitting: Pharmacist

## 2017-10-20 ENCOUNTER — Encounter (HOSPITAL_COMMUNITY): Payer: PPO

## 2017-10-20 DIAGNOSIS — Z0289 Encounter for other administrative examinations: Secondary | ICD-10-CM

## 2017-10-20 NOTE — Patient Outreach (Signed)
[facility]) Care Management  [place]   10/20/2017  John Blackburn 1946/03/09 [id number]  Subjective: Patient was called regarding medication management and assistance.  HIPAA identifiers were obtained. Patient is a 72 year old male with multiple medical conditions including but not limited to:  Allergic rhinitis, depression, hyperlipidemia, hypertension, sleep apnea, GERD, and history of pulmonary embolism.   Patient uses [employer] and manages his medications on his own.  He reported using a pill box but said he sometimes gets confused when he cannot identify the tablets.   Objective:   Encounter Medications: Outpatient Encounter Medications as of 10/20/2017  Medication Sig Note  . aspirin EC 81 MG EC tablet Take 1 tablet (81 mg total) by mouth daily.   [name] atenolol-chlorthalidone (TENORETIC) 50-25 MG tablet TAKE ONE TABLET BY MOUTH ONCE DAILY   . atorvastatin (LIPITOR) 80 MG tablet Take 1 tablet (80 mg total) by mouth daily at 6 PM.   . buPROPion (WELLBUTRIN XL) 300 MG 24 hr tablet TAKE 1 TABLET BY MOUTH ONCE DAILY   . clopidogrel (PLAVIX) 75 MG tablet Take 1 tablet (75 mg total) by mouth daily.   [name] loratadine (CLARITIN) 10 MG tablet Take 10 mg by mouth daily.   . Melatonin 1 MG/ML LIQD Take 2.5 mg by mouth at bedtime.  09/13/2017: Taking PRN as needed  . nitroGLYCERIN (NITROSTAT) 0.4 MG SL tablet Place 1 tablet (0.4 mg total) under the tongue every 5 (five) minutes as needed.   . Oxymetazoline HCl (VICKS SINEX MOISTURIZING NA) Place 1 spray into the nose daily as needed (congestion).   . pantoprazole (PROTONIX) 40 MG tablet Take 1 tablet (40 mg total) by mouth daily at 6 (six) AM.   . sertraline (ZOLOFT) 50 MG tablet TAKE ONE TABLET BY MOUTH ONCE DAILY   . tetrahydrozoline 0.05 % ophthalmic solution Place 1 drop into both eyes daily as needed (dry eyes).   . Turmeric 500 MG CAPS Take by mouth. 10/18/2017: Taking occasionally about once a week per  patient    No facility-administered encounter medications on file as of 10/20/2017.     Functional Status: In your present state of health, do you have any difficulty performing the following activities: 10/17/2017 09/13/2017  Hearing? N Y  Comment - "a tiny bit"  Vision? N N  Difficulty concentrating or making decisions? N Y  Comment - sometimes difficulty remembering things  Walking or climbing stairs? N N  Dressing or bathing? N N  Doing errands, shopping? N N  Preparing Food and eating ? N N  Using the Toilet? N N  In the past six months, have you accidently leaked urine? N Y  Do you have problems with loss of bowel control? N Y  Managing your Medications? N N  Managing your Finances? N N  Housekeeping or managing your Housekeeping? N N  Some recent data might be hidden    Fall/Depression Screening: Fall Risk  10/17/2017 10/16/2017 10/03/2017  Falls in the past year? Yes (No Data) Yes  Comment - no falls in the last month -  Number falls in past yr: 1 - 2 or more  Comment - - -  Injury with Fall? Yes - Yes  Comment fractured right arm - bruises/sratches  Risk Factor Category  - - -  Risk for fall due to : Impaired balance/gait - -  Follow up Falls prevention discussed - -   PHQ 2/9 Scores 10/17/2017 10/03/2017 09/13/2017 03/21/2017 01/18/2016 11/27/2014  PHQ - 2 Score 4 0 0 0 0 0  PHQ- 9 Score 9 - - - - -       Assessment:  Drugs sorted by system:  Neurologic/Psychologic: Bupropion Sertraline  Cardiovascular: Aspirin Atorvastatin Clopidogrel Nitroglycerin Furosemide Atenolol-Chlorthalidone  Pulmonary/Allergy: Loratadine  Gastrointestinal: Pantoprazole  Vitamins/Minerals: Turmeric  Miscellaneous: Tetrahydrozoline Ophthalmic solution   Medication Review Findings:  Patient reported he has difficulty recognizing his medications once they are in the pill box.          Medication Assistance Findings:  -Patient has [employer] Advantage Plan 1 -He did not  mention any issues paying for his medicaitons as they are all tier 1 generics on his health insurance.  Plan: Conduct a home visit for medication review and to design a custom medication list with photos to help the patient identify his medications.  Elayne Guerin, PharmD, [place] Clinical Pharmacist [phone]

## 2017-10-23 ENCOUNTER — Encounter: Payer: Self-pay | Admitting: *Deleted

## 2017-10-23 ENCOUNTER — Other Ambulatory Visit: Payer: Self-pay | Admitting: *Deleted

## 2017-10-23 NOTE — Patient Outreach (Signed)
[facility]) Care Management  10/23/2017  DAMOND BORCHERS August 18, 1946 [id number]  CSW was able to make initial contact with patient today to perform phone assessment, as well as assess and assist with social work needs and services.  CSW introduced self, explained role and types of services provided through [facility] ([facility]).  CSW further explained to patient that CSW works with patient's Telephonic RNCM, also with [facility], [place]. CSW then explained the reason for the call, indicating that Mrs. Tarpley thought that patient would benefit from social work services and resources to assist with referrals for counseling and mental health services.  CSW obtained two HIPAA compliant identifiers from patient, which included patient's name and date of birth. Patient reported that he received counseling services in the past but had to stop going because he was unable to afford the co-pay.  Patient is now interested in getting back into therapy but wanted to know of counseling services that were free of charge, or less than $50.00 per month, after insurance pays their portion.  CSW was able to research the following list of agencies for patient: (1)  [facility] [phone]), counseling program through the [facility] [place] *Not in network and do not accept [employer]* (2)  [facility] [phone]) *Accept [employer] with a $40.00 co-pay per visit* (3)  [place] [phone]) [place] with a $45.00 co-pay per visit* (4)  [facility] [phone]) *Not in network and do not accept [employer]age* (5)  [place] [phone]), counseling program through [facility] in [place] not bill insurance; first three visits are free of charge* (6)  Office of  [employer] [phone]), counseling program through [place] not bill insurance; first three visits are free of charge* (7)  [facility] [phone]), counseling program through [facility] [facility] in [place] not bill insurance* (8)  [place] [phone]), counseling program through [facility] in [place] not [employer]* CSW also agreed to mail patient a complete list of all counseling services in [place].  Once patient has received the list, CSW agreed to review the list with patient to find out who is in network with [employer], as well as find out what the out-of-pocket expense would be per visit.  Patient also agreed to make some calls from the list provided to him by CSW. In the meantime, CSW offered to provide counseling and supportive services to patient, just until he is able to get established with a provider, but patient did not appear to be interested.  Patient admitted that he really did not wish to have to "repeat his story with several different individuals", which is perfectly understandable.  CSW agreed to follow-up with patient in one week to ensure that he received the packet of resource information mailed to his home.  Patient has been encouraged to continue to take his psychotropic medications exactly as prescribed.  Patient denies feeling homicifal or suicidal at this time.  Patient voiced understanding and was agreeable to this plan. [facility] CM Care Plan Problem One     Most Recent Value  Care Plan Problem One  Patient is suffering from symptoms of depression.  Role Documenting the Problem One  Clinical Social Worker  Care Plan for Problem One  Active  [facility] Long Term Goal   Patient will have counseling and  supportive services in place, within the next 45 days.  [facility] Long Term Goal Start Date  10/23/17  Interventions for Problem One Long Term Goal  CSW has  agreed to assist patient with estabishing counseling and supportive services through a provider of choice.  [facility] CM Short Term Goal #1   Patient will review the list of counseling services provided to him by CSW, within the next 30 days.  [facility] CM Short Term Goal #1 Start Date  10/23/17  Interventions for Short Term Goal #1  CSW will mail patient a complete list of counseling agencies for his review.  [facility] CM Short Term Goal #2   Patient will call to try and establish counseling services through an agency of choice, within the next 30 days.  [facility] CM Short Term Goal #2 Start Date  10/23/17  Interventions for Short Term Goal #2  CSW provided patient with a list of counseling agencies over the phone today for him to call and request additional information.     Nat Christen, BSW, MSW, LCSW  Licensed Education officer, [facility]  Mailing [place] [address] [place], [place] [address] Physical Address-[address], [place], [place] [address] Toll Free Main # [phone] Fax # [phone] Cell # [phone]  Office # [phone] [email or web address]

## 2017-10-23 NOTE — Patient Outreach (Signed)
Request received from Joanna Saporito, LCSW to mail patient personal care resources.  Information mailed today. 

## 2017-10-24 ENCOUNTER — Ambulatory Visit (HOSPITAL_COMMUNITY): Payer: PPO

## 2017-10-24 ENCOUNTER — Encounter: Payer: Self-pay | Admitting: Pharmacist

## 2017-10-24 ENCOUNTER — Other Ambulatory Visit: Payer: Self-pay | Admitting: Pharmacist

## 2017-10-24 NOTE — Patient Outreach (Signed)
[facility]) Care Management  [place]   10/24/2017  John Blackburn Sep 11, 1945 [id number]  Subjective: Home visited completed at the patient's home today.   HIPAA identifiers were obtained. Patient is a 72 year old male with multiple medical conditions including but not limited to:  Allergic rhinitis, depression, hyperlipidemia, hypertension, sleep apnea, GERD, and history of pulmonary embolism.  The purpose of today's visit was to help the patient with his pill box as he reported getting confused after he fills his box.  Objective:   Encounter Medications: Outpatient Encounter Medications as of 10/24/2017  Medication Sig Note  . aspirin EC 81 MG EC tablet Take 1 tablet (81 mg total) by mouth daily.   [name] atenolol-chlorthalidone (TENORETIC) 50-25 MG tablet TAKE ONE TABLET BY MOUTH ONCE DAILY   . atorvastatin (LIPITOR) 80 MG tablet Take 1 tablet (80 mg total) by mouth daily at 6 PM.   . buPROPion (WELLBUTRIN XL) 300 MG 24 hr tablet TAKE 1 TABLET BY MOUTH ONCE DAILY   . clopidogrel (PLAVIX) 75 MG tablet Take 1 tablet (75 mg total) by mouth daily.   . furosemide (LASIX) 40 MG tablet TAKE ONE TABLET BY MOUTH ONCE DAILY   . loratadine (CLARITIN) 10 MG tablet Take 10 mg by mouth daily.   . Melatonin 1 MG/ML LIQD Take 2.5 mg by mouth at bedtime.  09/13/2017: Taking PRN as needed  . nitroGLYCERIN (NITROSTAT) 0.4 MG SL tablet Place 1 tablet (0.4 mg total) under the tongue every 5 (five) minutes as needed.   . Oxymetazoline HCl (VICKS SINEX MOISTURIZING NA) Place 1 spray into the nose daily as needed (congestion).   . pantoprazole (PROTONIX) 40 MG tablet Take 1 tablet (40 mg total) by mouth daily at 6 (six) AM.   . sertraline (ZOLOFT) 50 MG tablet TAKE ONE TABLET BY MOUTH ONCE DAILY   . tetrahydrozoline 0.05 % ophthalmic solution Place 1 drop into both eyes daily as needed (dry eyes).   . Turmeric 500 MG CAPS Take by mouth. 10/18/2017: Taking occasionally about once a week  per patient    No facility-administered encounter medications on file as of 10/24/2017.     Functional Status: In your present state of health, do you have any difficulty performing the following activities: 10/23/2017 10/17/2017  Hearing? N N  Comment - -  Vision? N N  Difficulty concentrating or making decisions? N N  Comment - -  Walking or climbing stairs? N N  Dressing or bathing? N N  Doing errands, shopping? N N  Preparing Food and eating ? N N  Using the Toilet? N N  In the past six months, have you accidently leaked urine? N N  Do you have problems with loss of bowel control? N N  Managing your Medications? N N  Managing your Finances? N N  Housekeeping or managing your Housekeeping? N N  Some recent data might be hidden    Fall/Depression Screening: Fall Risk  10/23/2017 10/17/2017 10/16/2017  Falls in the past year? Yes Yes (No Data)  Comment - - no falls in the last month  Number falls in past yr: 1 1 -  Comment - - -  Injury with Fall? Yes Yes -  Comment - fractured right arm -  Risk Factor Category  - - -  Risk for fall due to : Impaired balance/gait;History of fall(s) Impaired balance/gait -  Follow up Education provided;Falls prevention discussed Falls prevention discussed -   PHQ 2/9 Scores  10/23/2017 10/17/2017 10/03/2017 09/13/2017 03/21/2017 01/18/2016 11/27/2014  PHQ - 2 Score 4 4 0 0 0 0 0  PHQ- 9 Score 9 9 - - - - -      Assessment: Patient's medications were reviewed with his pill box and actual medication bottles.    Drugs sorted by system:  Neurologic/Psychologic: Bupropion Sertraline  Cardiovascular: Aspirin Atorvastatin Clopidogrel Nitroglycerin Furosemide Atenolol-Chlorthalidone  Pulmonary/Allergy: Loratadine Oxymetazoline nasal Spray  Gastrointestinal: Pantoprazole  Vitamins/Minerals: Turmeric  Miscellaneous: Tetrahydrozoline Ophthalmic solution    Medication Assistance Findings:  -patient has [employer]vant[employer] Plan  1 -patient receives partial extra help. -majority of his medications are generic and do not have a patient assistance program. -it was suggested to the patient that he get his medications sent to the pharmacy for 90 day supplies as he will save money when get 90 day fills.  Medication Adherence Intervention: Reviewed the patient's medications at length Created a personalized medication chart for the patient with the dose, time, indication and a picture of the actual tablet/capsule to help the patient no longer get confused when he fills his pill box.      Plan: Send the patient's provider the note and request chronic medications be sent in for a 90 day supply. Follow up with the patient about the chart created for him to see if it helps him when it is time to fill his pill box.   Elayne Guerin, PharmD, [place] Clinical Pharmacist [phone]

## 2017-10-25 ENCOUNTER — Encounter (HOSPITAL_COMMUNITY)
Admission: RE | Admit: 2017-10-25 | Discharge: 2017-10-25 | Disposition: A | Discharge status: Home / Self Care | Payer: PPO | Source: Ambulatory Visit | Attending: Cardiovascular Disease | Admitting: Cardiovascular Disease

## 2017-10-25 DIAGNOSIS — I214 Non-ST elevation (NSTEMI) myocardial infarction: Secondary | ICD-10-CM | POA: Diagnosis not present

## 2017-10-25 DIAGNOSIS — Z955 Presence of coronary angioplasty implant and graft: Secondary | ICD-10-CM

## 2017-10-26 NOTE — Progress Notes (Signed)
Cardiac Individual Treatment Plan  Patient Details  Name: John Blackburn MRN: [id number] Date of Birth: [birth year] Referring Provider:     CARDIAC REHAB PHASE II ORIENTATION from 10/03/2017 in [place]  Referring Provider  Dr Lorie Phenix       Initial Encounter Date:    CARDIAC REHAB PHASE II ORIENTATION from 10/03/2017 in [place]  Date  10/03/17  Referring Provider  Dr Lorie Phenix       Visit Diagnosis: Status post coronary artery stent placement 09/01/17 S/P DES Mid RCA  NSTEMI (non-ST elevated myocardial infarction) (HCC)08/30/17  Patient's Home Medications on Admission:  Current Outpatient Medications:  .  aspirin EC 81 MG EC tablet, Take 1 tablet (81 mg total) by mouth daily., Disp: , Rfl:  .  atenolol-chlorthalidone (TENORETIC) 50-25 MG tablet, TAKE ONE TABLET BY MOUTH ONCE DAILY, Disp: 90 tablet, Rfl: 3 .  atorvastatin (LIPITOR) 80 MG tablet, Take 1 tablet (80 mg total) by mouth daily at 6 PM., Disp: 90 tablet, Rfl: 1 .  buPROPion (WELLBUTRIN XL) 300 MG 24 hr tablet, TAKE 1 TABLET BY MOUTH ONCE DAILY, Disp: 30 tablet, Rfl: 2 .  clopidogrel (PLAVIX) 75 MG tablet, Take 1 tablet (75 mg total) by mouth daily., Disp: 90 tablet, Rfl: 2 .  furosemide (LASIX) 40 MG tablet, TAKE ONE TABLET BY MOUTH ONCE DAILY, Disp: 30 tablet, Rfl: 3 .  loratadine (CLARITIN) 10 MG tablet, Take 10 mg by mouth daily., Disp: , Rfl:  .  Melatonin 1 MG/ML LIQD, Take 2.5 mg by mouth at bedtime. , Disp: , Rfl:  .  nitroGLYCERIN (NITROSTAT) 0.4 MG SL tablet, Place 1 tablet (0.4 mg total) under the tongue every 5 (five) minutes as needed., Disp: 25 tablet, Rfl: 2 .  Oxymetazoline HCl (VICKS SINEX MOISTURIZING NA), Place 1 spray into the nose daily as needed (congestion)., Disp: , Rfl:  .  pantoprazole (PROTONIX) 40 MG tablet, Take 1 tablet (40 mg total) by mouth daily at 6 (six) AM., Disp: 30 tablet, Rfl: 2 .  sertraline (ZOLOFT) 50  MG tablet, TAKE ONE TABLET BY MOUTH ONCE DAILY, Disp: 30 tablet, Rfl: 2 .  tetrahydrozoline 0.05 % ophthalmic solution, Place 1 drop into both eyes daily as needed (dry eyes)., Disp: , Rfl:  .  Turmeric 500 MG CAPS, Take by mouth., Disp: , Rfl:   Past Medical History: Past Medical History:  Diagnosis Date  . Allergy   . 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 CAD (coronary artery disease)    08/2017 PCI/DES mRCA, RPDA, CTO pf pLCX with collaterals, normal EF  . Depression    on multiple meds. Has been seen at [facility]. and Dr. Sabra Heck is his prescriber  . Diverticulitis of colon 2000 's   treated as an outpatient  . GERD (gastroesophageal reflux disease)    UGI done August '12 - ulcer/. Dr. Ferdinand Lango, gastroenterologist in [facility].   [name] Hx of adenomatous colonic polyps   . Hyperlipidemia    last lipid panel: HDL 44, LDL 117  . Hypertension   . Peptic ulcer    in the past and just recently-August '12  . Pulmonary emboli [facility])    noted October 2014 - treated by Dr. Linda Hedges  . skin cancer    skin CA  . Sleep apnea, primary central    wears CPAP    Tobacco Use: Social History   Tobacco Use  Smoking  Status Never Smoker  Smokeless Tobacco Never Used    Labs: Recent Review Flowsheet Data    Labs for ITP Cardiac and Pulmonary Rehab Latest Ref Rng & Units 01/17/2017 08/30/2017 09/01/2017 09/01/2017 09/01/2017   Cholestrol 0 - 200 mg/dL - - 166 - -   LDLCALC 0 - 99 mg/dL - - 114(H) - -   LDLDIRECT mg/dL - - - - -   HDL >40 mg/dL - - 30(L) - -   Trlycerides <150 mg/dL - - 109 - -   Hemoglobin A1c 4.6 - 6.5 % 5.9 - - - -   PHART 7.350 - 7.450 - - - 7.392 -   PCO2ART 32.0 - 48.0 mmHg - - - 41.5 -   HCO3 20.0 - 28.0 mmol/L - - - 25.2 26.1   TCO2 22 - 32 mmol/L - 28 - 26 27   O2SAT % - - - 97.0 71.0      Capillary Blood Glucose: No results found for: GLUCAP   Exercise Target Goals:    Exercise Program Goal: Individual exercise prescription set using  results from initial 6 min walk test and THRR while considering  patient's activity barriers and safety.   Exercise Prescription Goal: Initial exercise prescription builds to 30-45 minutes a day of aerobic activity, 2-3 days per week.  Home exercise guidelines will be given to patient during program as part of exercise prescription that the participant will acknowledge.  Activity Barriers & Risk Stratification: Activity Barriers & Cardiac Risk Stratification - 10/03/17 1457      Activity Barriers & Cardiac Risk Stratification   Activity Barriers  Right Knee Replacement;Left Knee Replacement;Arthritis;Muscular Weakness;Balance Concerns;Assistive Device;History of Falls       6 Minute Walk: 6 Minute Walk    Row Name 10/03/17 1441 10/03/17 1457       6 Minute Walk   Phase  Initial  -    Distance  897 feet  -    Walk Time  6 minutes  -    # of Rest Breaks  0  -    MPH  1.7  -    METS  1.56  -    RPE  13  -    VO2 Peak  5.5  -    Symptoms  No  -    Resting HR  56 bpm  -    Resting BP  104/56  -    Resting Oxygen Saturation   96 %  -    Exercise Oxygen Saturation  during 6 min walk  94 % encouraged PLB, O2 increased to 96%  -    Max Ex. HR  66 bpm  -    Max Ex. BP  124/74  -    2 Minute Post BP  -  104/60       Oxygen Initial Assessment:   Oxygen Re-Evaluation:   Oxygen Discharge (Final Oxygen Re-Evaluation):   Initial Exercise Prescription: Initial Exercise Prescription - 10/03/17 1500      Date of Initial Exercise RX and Referring Provider   Date  10/03/17    Referring Provider  Dr Lorie Phenix       NuStep   Level  1    SPM  50    Minutes  10    METs  1      Arm Ergometer   Level  1    RPM  10    Minutes  10    METs  1  Track   Laps  6    Minutes  10    METs  2.03      Prescription Details   Frequency (times per week)  3    Duration  Progress to 30 minutes of continuous aerobic without signs/symptoms of physical distress      Intensity    THRR 40-80% of Max Heartrate  60-119    Ratings of Perceived Exertion  11-15    Perceived Dyspnea  0-4      Progression   Progression  Continue to progress workloads to maintain intensity without signs/symptoms of physical distress.      Resistance Training   Training Prescription  Yes    Weight  1lb    Reps  10-15       Perform Capillary Blood Glucose checks as needed.  Exercise Prescription Changes: Exercise Prescription Changes    Row Name 10/25/17 1502             Response to Exercise   Blood Pressure (Admit)  109/72       Blood Pressure (Exercise)  100/72       Blood Pressure (Exit)  118/60       Heart Rate (Admit)  55 bpm       Heart Rate (Exercise)  94 bpm       Heart Rate (Exit)  55 bpm       Rating of Perceived Exertion (Exercise)  13       Symptoms  none       Duration  Progress to 30 minutes of  aerobic without signs/symptoms of physical distress       Intensity  THRR unchanged         Progression   Progression  Continue to progress workloads to maintain intensity without signs/symptoms of physical distress.       Average METs  1.6         Resistance Training   Training Prescription  No Relaxation today, no weights.         Interval Training   Interval Training  No         NuStep   Level  2       SPM  68       Minutes  10       METs  1.6         Arm Ergometer   Level  1       Minutes  10       METs  1.5         Track   Minutes  10          Exercise Comments: Exercise Comments    Row Name 10/25/17 1632           Exercise Comments  Off to a good start with exercise.          Exercise Goals and Review: Exercise Goals    Row Name 10/03/17 1357             Exercise Goals   Increase Physical Activity  Yes       Intervention  Provide advice, education, support and counseling about physical activity/exercise needs.;Develop an individualized exercise prescription for aerobic and resistive training based on initial evaluation  findings, risk stratification, comorbidities and participant's personal goals.       Expected Outcomes  Short Term: Attend rehab on a regular basis to increase amount of physical activity.;Long Term: Exercising regularly at least 3-5 days  a week.;Long Term: Add in home exercise to make exercise part of routine and to increase amount of physical activity.       Increase Strength and Stamina  Yes be able to walk 1 mile       Intervention  Provide advice, education, support and counseling about physical activity/exercise needs.;Develop an individualized exercise prescription for aerobic and resistive training based on initial evaluation findings, risk stratification, comorbidities and participant's personal goals.       Expected Outcomes  Short Term: Increase workloads from initial exercise prescription for resistance, speed, and METs.;Short Term: Perform resistance training exercises routinely during rehab and add in resistance training at home;Long Term: Improve cardiorespiratory fitness, muscular endurance and strength as measured by increased METs and functional capacity (6MWT)       Able to understand and use rate of perceived exertion (RPE) scale  Yes       Intervention  Provide education and explanation on how to use RPE scale       Expected Outcomes  Short Term: Able to use RPE daily in rehab to express subjective intensity level;Long Term:  Able to use RPE to guide intensity level when exercising independently       Knowledge and understanding of Target Heart Rate Range (THRR)  Yes       Intervention  Provide education and explanation of THRR including how the numbers were predicted and where they are located for reference       Expected Outcomes  Short Term: Able to state/look up THRR;Long Term: Able to use THRR to govern intensity when exercising independently;Short Term: Able to use daily as guideline for intensity in rehab       Able to check pulse independently  Yes       Intervention  Provide  education and demonstration on how to check pulse in carotid and radial arteries.;Review the importance of being able to check your own pulse for safety during independent exercise       Expected Outcomes  Short Term: Able to explain why pulse checking is important during independent exercise;Long Term: Able to check pulse independently and accurately       Understanding of Exercise Prescription  Yes       Intervention  Provide education, explanation, and written materials on patient's individual exercise prescription       Expected Outcomes  Short Term: Able to explain program exercise prescription;Long Term: Able to explain home exercise prescription to exercise independently          Exercise Goals Re-Evaluation : Exercise Goals Re-Evaluation    [place] Name 10/25/17 1631             Exercise Goal Re-Evaluation   Exercise Goals Review  Increase Physical Activity;Able to understand and use rate of perceived exertion (RPE) scale       Comments  Patient tolerating low intensity exercise without c/o. Patient understands and is able to use the RPE scale appropriately.       Expected Outcomes  Increase workloads as tolerated to help achieve health and fitness goals.           Discharge Exercise Prescription (Final Exercise Prescription Changes): Exercise Prescription Changes - 10/25/17 1502      Response to Exercise   Blood Pressure (Admit)  109/72    Blood Pressure (Exercise)  100/72    Blood Pressure (Exit)  118/60    Heart Rate (Admit)  55 bpm    Heart Rate (Exercise)  94 bpm  Heart Rate (Exit)  55 bpm    Rating of Perceived Exertion (Exercise)  13    Symptoms  none    Duration  Progress to 30 minutes of  aerobic without signs/symptoms of physical distress    Intensity  THRR unchanged      Progression   Progression  Continue to progress workloads to maintain intensity without signs/symptoms of physical distress.    Average METs  1.6      Resistance Training   Training  Prescription  No Relaxation today, no weights.      Interval Training   Interval Training  No      NuStep   Level  2    SPM  68    Minutes  10    METs  1.6      Arm Ergometer   Level  1    Minutes  10    METs  1.5      Track   Minutes  10       Nutrition:  Target Goals: Understanding of nutrition guidelines, daily intake of sodium 1500mg , cholesterol 200mg , calories 30% from fat and 7% or less from saturated fats, daily to have 5 or more servings of fruits and vegetables.  Biometrics: Pre Biometrics - 10/03/17 1442      Pre Biometrics   Height  6' (1.829 m)    Weight  235 lb 7.2 oz (106.8 kg)    Waist Circumference  42.5 inches    Hip Circumference  44.5 inches    Waist to Hip Ratio  0.96 %    BMI (Calculated)  31.93    Triceps Skinfold  18 mm    % Body Fat  30.7 %    Grip Strength  27 kg    Flexibility  9 in    Single Leg Stand  1 seconds        Nutrition Therapy Plan and Nutrition Goals: Nutrition Therapy & Goals - 10/04/17 1157      Nutrition Therapy   Diet  Heart Healthy      Intervention Plan   Intervention  Prescribe, educate and counsel regarding individualized specific dietary modifications aiming towards targeted core components such as weight, hypertension, lipid management, diabetes, heart failure and other comorbidities.    Expected Outcomes  Short Term Goal: Understand basic principles of dietary content, such as calories, fat, sodium, cholesterol and nutrients.;Long Term Goal: Adherence to prescribed nutrition plan.       Nutrition Assessments:   Nutrition Goals Re-Evaluation:   Nutrition Goals Re-Evaluation:   Nutrition Goals Discharge (Final Nutrition Goals Re-Evaluation):   Psychosocial: Target Goals: Acknowledge presence or absence of significant depression and/or stress, maximize coping skills, provide positive support system. Participant is able to verbalize types and ability to use techniques and skills needed for reducing  stress and depression.  Initial Review & Psychosocial Screening: Initial Psych Review & Screening - 10/03/17 1545      Initial Review   Current issues with  History of Depression      Family Dynamics   Good Support System?  Yes    Concerns  -- Lorenda Ishihara says he went through a bad divorce 14 years ago      Barriers   Psychosocial barriers to participate in program  The patient should benefit from training in stress management and relaxation.      Screening Interventions   Interventions  Encouraged to exercise;To provide support and resources with identified psychosocial needs    Expected  Outcomes  Long Term Goal: Stressors or current issues are controlled or eliminated.;Short Term goal: Identification and review with participant of any Quality of Life or Depression concerns found by scoring the questionnaire.;Long Term goal: The participant improves quality of Life and PHQ9 Scores as seen by post scores and/or verbalization of changes       Quality of Life Scores: Quality of Life - 10/24/17 1419      Quality of Life Scores   Health/Function Pre  19.23 %    Socioeconomic Pre  18.21 %    Psych/Spiritual Pre  25 %    Family Pre  19.38 %    GLOBAL Pre  20.26 %      Scores of 19 and below usually indicate a poorer quality of life in these areas.  A difference of  2-3 points is a clinically meaningful difference.  A difference of 2-3 points in the total score of the Quality of Life Index has been associated with significant improvement in overall quality of life, self-image, physical symptoms, and general health in studies assessing change in quality of life.  PHQ-9: Recent Review Flowsheet Data    Depression screen [facility] 2/9 10/23/2017 10/17/2017 10/03/2017 10/03/2017 09/13/2017   Decreased Interest 1 1 (No Data)  0 0   Down, Depressed, Hopeless 3 3 - 0 0   PHQ - 2 Score 4 4 - 0 0   Altered sleeping 0 0 - - -   Tired, decreased energy 1 1 - - -   Change in appetite 0 0 - - -   Feeling bad or  failure about yourself  3 3 - - -   Trouble concentrating 0 0 - - -   Moving slowly or fidgety/restless 0 0 - - -   Suicidal thoughts 1 1 - - -   PHQ-9 Score 9 9 - - -   Difficult doing work/chores Somewhat difficult Somewhat difficult - - -     Interpretation of Total Score  Total Score Depression Severity:  1-4 = Minimal depression, 5-9 = Mild depression, 10-14 = Moderate depression, 15-19 = Moderately severe depression, 20-27 = Severe depression   Psychosocial Evaluation and Intervention:   Psychosocial Re-Evaluation: Psychosocial Re-Evaluation    Row Name 10/26/17 1522             Psychosocial Re-Evaluation   Current issues with  History of Depression       Comments  will review QOL scores with the patient this week       Interventions  Stress management education       Continue Psychosocial Services   Follow up required by staff          Psychosocial Discharge (Final Psychosocial Re-Evaluation): Psychosocial Re-Evaluation - 10/26/17 1522      Psychosocial Re-Evaluation   Current issues with  History of Depression    Comments  will review QOL scores with the patient this week    Interventions  Stress management education    Continue Psychosocial Services   Follow up required by staff       Vocational Rehabilitation: Provide vocational rehab assistance to qualifying candidates.   Vocational Rehab Evaluation & Intervention: Vocational Rehab - 10/03/17 1543      Initial Vocational Rehab Evaluation & Intervention   Assessment shows need for Vocational Rehabilitation  Yes Lorenda Ishihara is retired but says he is interested in vocational rehab for job retraining     Vocational Rehab Packet given to patient  10/03/17  Education: Education Goals: Education classes will be provided on a weekly basis, covering required topics. Participant will state understanding/return demonstration of topics presented.  Learning Barriers/Preferences: Learning Barriers/Preferences -  10/03/17 1459      Learning Barriers/Preferences   Learning Barriers  Hearing;Sight       Education Topics: Count Your Pulse:  -Group instruction provided by verbal instruction, demonstration, patient participation and written materials to support subject.  Instructors address importance of being able to find your pulse and how to count your pulse when at home without a heart monitor.  Patients get hands on experience counting their pulse with staff help and individually.   Heart Attack, Angina, and Risk Factor Modification:  -Group instruction provided by verbal instruction, video, and written materials to support subject.  Instructors address signs and symptoms of angina and heart attacks.    Also discuss risk factors for heart disease and how to make changes to improve heart health risk factors.   Functional Fitness:  -Group instruction provided by verbal instruction, demonstration, patient participation, and written materials to support subject.  Instructors address safety measures for doing things around the house.  Discuss how to get up and down off the floor, how to pick things up properly, how to safely get out of a chair without assistance, and balance training.   Meditation and Mindfulness:  -Group instruction provided by verbal instruction, patient participation, and written materials to support subject.  Instructor addresses importance of mindfulness and meditation practice to help reduce stress and improve awareness.  Instructor also leads participants through a meditation exercise.    Stretching for Flexibility and Mobility:  -Group instruction provided by verbal instruction, patient participation, and written materials to support subject.  Instructors lead participants through series of stretches that are designed to increase flexibility thus improving mobility.  These stretches are additional exercise for major muscle groups that are typically performed during regular warm up  and cool down.   Hands Only CPR:  -Group verbal, video, and participation provides a basic overview of AHA guidelines for community CPR. Role-play of emergencies allow participants the opportunity to practice calling for help and chest compression technique with discussion of AED use.   Hypertension: -Group verbal and written instruction that provides a basic overview of hypertension including the most recent diagnostic guidelines, risk factor reduction with self-care instructions and medication management.    Nutrition I class: Heart Healthy Eating:  -Group instruction provided by PowerPoint slides, verbal discussion, and written materials to support subject matter. The instructor gives an explanation and review of the Therapeutic Lifestyle Changes diet recommendations, which includes a discussion on lipid goals, dietary fat, sodium, fiber, plant stanol/sterol esters, sugar, and the components of a well-balanced, healthy diet.   Nutrition II class: Lifestyle Skills:  -Group instruction provided by PowerPoint slides, verbal discussion, and written materials to support subject matter. The instructor gives an explanation and review of label reading, grocery shopping for heart health, heart healthy recipe modifications, and ways to make healthier choices when eating out.   Diabetes Question & Answer:  -Group instruction provided by PowerPoint slides, verbal discussion, and written materials to support subject matter. The instructor gives an explanation and review of diabetes co-morbidities, pre- and post-prandial blood glucose goals, pre-exercise blood glucose goals, signs, symptoms, and treatment of hypoglycemia and hyperglycemia, and foot care basics.   Diabetes Blitz:  -Group instruction provided by PowerPoint slides, verbal discussion, and written materials to support subject matter. The instructor gives an explanation and review of  the physiology behind type 1 and type 2 diabetes, diabetes  medications and rational behind using different medications, pre- and post-prandial blood glucose recommendations and Hemoglobin A1c goals, diabetes diet, and exercise including blood glucose guidelines for exercising safely.    Portion Distortion:  -Group instruction provided by PowerPoint slides, verbal discussion, written materials, and food models to support subject matter. The instructor gives an explanation of serving size versus portion size, changes in portions sizes over the last 20 years, and what consists of a serving from each food group.   Stress Management:  -Group instruction provided by verbal instruction, video, and written materials to support subject matter.  Instructors review role of stress in heart disease and how to cope with stress positively.     Exercising on Your Own:  -Group instruction provided by verbal instruction, power point, and written materials to support subject.  Instructors discuss benefits of exercise, components of exercise, frequency and intensity of exercise, and end points for exercise.  Also discuss use of nitroglycerin and activating EMS.  Review options of places to exercise outside of rehab.  Review guidelines for sex with heart disease.   Cardiac Drugs I:  -Group instruction provided by verbal instruction and written materials to support subject.  Instructor reviews cardiac drug classes: antiplatelets, anticoagulants, beta blockers, and statins.  Instructor discusses reasons, side effects, and lifestyle considerations for each drug class.   Cardiac Drugs II:  -Group instruction provided by verbal instruction and written materials to support subject.  Instructor reviews cardiac drug classes: angiotensin converting enzyme inhibitors (ACE-I), angiotensin II receptor blockers (ARBs), nitrates, and calcium channel blockers.  Instructor discusses reasons, side effects, and lifestyle considerations for each drug class.   Anatomy and Physiology of the  Circulatory System:  Group verbal and written instruction and models provide basic cardiac anatomy and physiology, with the coronary electrical and arterial systems. Review of: AMI, Angina, Valve disease, Heart Failure, Peripheral Artery Disease, Cardiac Arrhythmia, Pacemakers, and the ICD.   Other Education:  -Group or individual verbal, written, or video instructions that support the educational goals of the cardiac rehab program.   Holiday Eating Survival Tips:  -Group instruction provided by PowerPoint slides, verbal discussion, and written materials to support subject matter. The instructor gives patients tips, tricks, and techniques to help them not only survive but enjoy the holidays despite the onslaught of food that accompanies the holidays.   Knowledge Questionnaire Score: Knowledge Questionnaire Score - 10/24/17 1420      Knowledge Questionnaire Score   Pre Score  21/24       Core Components/Risk Factors/Patient Goals at Admission: Personal Goals and Risk Factors at Admission - 10/03/17 1457      Core Components/Risk Factors/Patient Goals on Admission    Weight Management  Yes;Obesity;Weight Maintenance    Intervention  Weight Management: Develop a combined nutrition and exercise program designed to reach desired caloric intake, while maintaining appropriate intake of nutrient and fiber, sodium and fats, and appropriate energy expenditure required for the weight goal.;Weight Management/Obesity: Establish reasonable short term and long term weight goals.;Obesity: Provide education and appropriate resources to help participant work on and attain dietary goals.;Weight Management: Provide education and appropriate resources to help participant work on and attain dietary goals.    Admit Weight  235 lb 7.2 oz (106.8 kg)    Goal Weight: Short Term  230 lb (104.3 kg)    Goal Weight: Long Term  225 lb (102.1 kg)    Expected Outcomes  Short Term:  Continue to assess and modify  interventions until short term weight is achieved;Long Term: Adherence to nutrition and physical activity/exercise program aimed toward attainment of established weight goal;Weight Loss: Understanding of general recommendations for a balanced deficit meal plan, which promotes 1-2 lb weight loss per week and includes a negative energy balance of [phone] kcal/d;Weight Maintenance: Understanding of the daily nutrition guidelines, which includes 25-35% calories from fat, 7% or less cal from saturated fats, less than 200mg  cholesterol, less than 1.5gm of sodium, & 5 or more servings of fruits and vegetables daily;Understanding recommendations for meals to include 15-35% energy as protein, 25-35% energy from fat, 35-60% energy from carbohydrates, less than 200mg  of dietary cholesterol, 20-35 gm of total fiber daily;Understanding of distribution of calorie intake throughout the day with the consumption of 4-5 meals/snacks    Hypertension  Yes    Intervention  Provide education on lifestyle modifcations including regular physical activity/exercise, weight management, moderate sodium restriction and increased consumption of fresh fruit, vegetables, and low fat dairy, alcohol moderation, and smoking cessation.;Monitor prescription use compliance.    Expected Outcomes  Short Term: Continued assessment and intervention until BP is < 140/28mm HG in hypertensive participants. < 130/70mm HG in hypertensive participants with diabetes, heart failure or chronic kidney disease.;Long Term: Maintenance of blood pressure at goal levels.       Core Components/Risk Factors/Patient Goals Review:  Goals and Risk Factor Review    Row Name 10/26/17 1516             Core Components/Risk Factors/Patient Goals Review   Personal Goals Review  Weight Management/Obesity;Hypertension       Review  Whit's vital signs have been stable at cardiac rehab so far       Expected Outcomes  Whit will continue to participate in phase 2  cardiac rehab and take his medcations as presribed          Core Components/Risk Factors/Patient Goals at Discharge (Final Review):  Goals and Risk Factor Review - 10/26/17 1516      Core Components/Risk Factors/Patient Goals Review   Personal Goals Review  Weight Management/Obesity;Hypertension    Review  Whit's vital signs have been stable at cardiac rehab so far    Expected Outcomes  Whit will continue to participate in phase 2 cardiac rehab and take his medcations as presribed       ITP Comments: ITP Comments    Row Name 10/03/17 1346 10/26/17 1513         ITP Comments  Dr. Fransico Him, Medical Director  30 day ITP Review. Patient is off to a good start to exercise.         Comments: See ITP comments. Mr Williard was initially mixed up about his appointments for exercise but seems to be on track with his class schedule.Barnet Pall, RN,BSN 10/26/2017 3:28 PM

## 2017-10-27 ENCOUNTER — Encounter (HOSPITAL_COMMUNITY)
Admission: RE | Admit: 2017-10-27 | Discharge: 2017-10-27 | Disposition: A | Discharge status: Home / Self Care | Payer: PPO | Source: Ambulatory Visit | Attending: Cardiovascular Disease | Admitting: Cardiovascular Disease

## 2017-10-27 DIAGNOSIS — Z955 Presence of coronary angioplasty implant and graft: Secondary | ICD-10-CM

## 2017-10-27 DIAGNOSIS — I214 Non-ST elevation (NSTEMI) myocardial infarction: Secondary | ICD-10-CM | POA: Diagnosis not present

## 2017-10-27 NOTE — Progress Notes (Signed)
QUALITY OF LIFE SCORE REVIEW  Pt completed Quality of Life survey as a participant in Cardiac Rehab. Scores 21.0 or below are considered low. Pt score very low in several areas Overall 20.26, Health and Function 19.23, socioeconomic 18.21, physiological and spiritual 25.0, family 19.28. Patient quality of life slightly altered by physical constraints which limits ability to perform as prior to recent cardiac illness. John Blackburn says he has been somewhat depressed since his hospitalization in December. John Blackburn is currently on an antidepressant and says he feels his medications are working for him although sometimes not as well. Offered emotional support and reassurance.  Will continue to monitor and intervene as necessary.  Will forward John Blackburn's quality of life questionnaire to his primary care provider Dr Pricilla Holm for review.Barnet Pall, RN,BSN 10/27/2017 4:36 PM

## 2017-10-28 DIAGNOSIS — G4733 Obstructive sleep apnea (adult) (pediatric): Secondary | ICD-10-CM | POA: Diagnosis not present

## 2017-10-30 ENCOUNTER — Telehealth: Payer: Self-pay | Admitting: Internal Medicine

## 2017-10-30 ENCOUNTER — Other Ambulatory Visit: Payer: Self-pay | Admitting: *Deleted

## 2017-10-30 NOTE — Telephone Encounter (Signed)
FYI

## 2017-10-30 NOTE — Telephone Encounter (Signed)
Copied from [place] [phone]. Topic: Quick Communication - See Telephone Encounter >> Oct 30, 2017  9:22 AM Synthia Innocent wrote: CRM for notification. See Telephone encounter for: Cardiac Rehab calling, stating patient scored low on depression test. Will fax over results. Believes he needs to be seen.   10/30/17.

## 2017-10-30 NOTE — Telephone Encounter (Signed)
Called patient to schedule. He did not want to schedule at this time.

## 2017-10-30 NOTE — Patient Outreach (Signed)
[facility]) Care Management  10/30/2017  John Blackburn 05-21-1946 [id number]   CSW was able to make contact with patient today to follow up regarding social work services and resources, as well as ensure that patient received the packet of resource information mailed to his home by CSW.  Patient admitted that he has not only received the packet of resource information but also has an appointment lined up with a therapist to receive counseling and supportive services.  Patient was most appreciative of the resource information and the time spent providing him with a list of reputable providers. CSW will perform a case closure on patient, as all goals of treatment have been met from social work standpoint and no additional social work needs have been identified at this time.  CSW will notify patient's RNCM with [place] [facility], Hubert Azure of CSW's plans to close patient's case.  CSW will fax an update to patient's Primary Care Physician, Dr. Pricilla Holm to ensure that they are aware of CSW's involvement with patient's plan of care.  CSW will submit a case closure request to Alycia Rossetti, Care Management Assistant with [facility], in the form of an [employer].  CSW will ensure that Mrs. Arelia Sneddon is aware of Mrs. Tarpley's, RNCM with [place] [facility], continued involvement with patient's care. [facility] CM Care Plan Problem One     Most Recent Value  Care Plan Problem One  Patient is suffering from symptoms of depression.  Role Documenting the Problem One  Clinical Social Worker  Care Plan for Problem One  Active  [facility] Long Term Goal   Patient will have counseling and supportive services in place, within the next 45 days.  [facility] Long Term Goal Start Date  10/23/17  [facility] Long Term Goal Met Date  10/30/17  Interventions for Problem One Long Term Goal  CSW has agreed to assist patient with estabishing  counseling and supportive services through a provider of choice.  [facility] CM Short Term Goal #1   Patient will review the list of counseling services provided to him by CSW, within the next 30 days.  [facility] CM Short Term Goal #1 Start Date  10/23/17  [facility] CM Short Term Goal #1 Met Date  10/30/17  Interventions for Short Term Goal #1  CSW will mail patient a complete list of counseling agencies for his review.  [facility] CM Short Term Goal #2   Patient will call to try and establish counseling services through an agency of choice, within the next 30 days.  [facility] CM Short Term Goal #2 Start Date  10/23/17  [facility] CM Short Term Goal #2 Met Date  10/30/17  Interventions for Short Term Goal #2  CSW provided patient with a list of counseling agencies over the phone today for him to call and request additional information.     Nat Christen, BSW, MSW, LCSW  Licensed Education officer, [facility]  Mailing [place] [address], [place], [place] [address] Physical Address-[address], [place],  [address] Toll Free Main # [phone] Fax # [phone] Cell # [phone]  Office # [phone] [email or web address]

## 2017-10-30 NOTE — Telephone Encounter (Signed)
He no showed for appointment for that purpose. Can call patient to reschedule. Please do not fax results we have done [place] [address] recently.

## 2017-10-30 NOTE — Telephone Encounter (Signed)
Can you please schedule an appointment for patient. Thank you  

## 2017-11-01 ENCOUNTER — Other Ambulatory Visit: Payer: Self-pay | Admitting: Pharmacist

## 2017-11-01 ENCOUNTER — Ambulatory Visit: Payer: Self-pay | Admitting: Pharmacist

## 2017-11-01 ENCOUNTER — Encounter (HOSPITAL_COMMUNITY)
Admission: RE | Admit: 2017-11-01 | Discharge: 2017-11-01 | Disposition: A | Discharge status: Home / Self Care | Payer: PPO | Source: Ambulatory Visit | Attending: Cardiovascular Disease | Admitting: Cardiovascular Disease

## 2017-11-01 ENCOUNTER — Encounter (HOSPITAL_COMMUNITY): Payer: PPO

## 2017-11-01 DIAGNOSIS — Z955 Presence of coronary angioplasty implant and graft: Secondary | ICD-10-CM

## 2017-11-01 DIAGNOSIS — I214 Non-ST elevation (NSTEMI) myocardial infarction: Secondary | ICD-10-CM | POA: Diagnosis not present

## 2017-11-01 NOTE — Patient Outreach (Signed)
[facility]) Care Management  11/01/2017  John Blackburn Sep 02, 1946 [id number]   Patient was called to follow up on medication adherence.  Unfortunately, he did not answer the phone. HIPAA compliant message was left on the patient's voicemail.  Plan:  Call patient back tomorrow per protocol.  Elayne Guerin, PharmD, [place] Clinical Pharmacist [phone]

## 2017-11-02 ENCOUNTER — Other Ambulatory Visit: Payer: Self-pay | Admitting: Pharmacist

## 2017-11-02 NOTE — Patient Outreach (Signed)
[facility]) Care Management  11/02/2017  John Blackburn April 02, 1946 [id number]   Patient was called today to follow up on medication adherence. HIPAA identifiers were obtained. Patient is a 72 year old male with multiple medical conditions including but not limited to:  Allergic rhinitis, depression, hyperlipidemia, hypertension, sleep apnea, GERD, and history of pulmonary embolism.  A home visit was completed at the patient's home on 10/24/17 to assist with medication adherence.   A personalized medication chart was created for the patient and the purpose of today's call was to follow up on the use of the chart and the patient's ability to fill the pill box correctly.  Patient reported the chart is very helpful and he is able to identify his medications and fill his pill box more efficiently and correctly.  Plan:  Close patient's pharmacy case. Patient has [facility] Pharmacist's contact information and understands that he can call at any time in the future with medication related questions or concerns. Notify Telephonic Nurse Case Manager as she is still active in the patient's case.  Elayne Guerin, PharmD, [place] Clinical Pharmacist [phone]

## 2017-11-03 ENCOUNTER — Encounter (HOSPITAL_COMMUNITY): Payer: PPO

## 2017-11-03 ENCOUNTER — Encounter (HOSPITAL_COMMUNITY)
Admission: RE | Admit: 2017-11-03 | Discharge: 2017-11-03 | Disposition: A | Discharge status: Home / Self Care | Payer: PPO | Source: Ambulatory Visit | Attending: Cardiovascular Disease | Admitting: Cardiovascular Disease

## 2017-11-03 DIAGNOSIS — Z79899 Other long term (current) drug therapy: Secondary | ICD-10-CM | POA: Diagnosis not present

## 2017-11-03 DIAGNOSIS — Z7982 Long term (current) use of aspirin: Secondary | ICD-10-CM | POA: Diagnosis not present

## 2017-11-03 DIAGNOSIS — K219 Gastro-esophageal reflux disease without esophagitis: Secondary | ICD-10-CM | POA: Insufficient documentation

## 2017-11-03 DIAGNOSIS — I2510 Atherosclerotic heart disease of native coronary artery without angina pectoris: Secondary | ICD-10-CM | POA: Diagnosis not present

## 2017-11-03 DIAGNOSIS — Z7902 Long term (current) use of antithrombotics/antiplatelets: Secondary | ICD-10-CM | POA: Insufficient documentation

## 2017-11-03 DIAGNOSIS — F329 Major depressive disorder, single episode, unspecified: Secondary | ICD-10-CM | POA: Insufficient documentation

## 2017-11-03 DIAGNOSIS — I10 Essential (primary) hypertension: Secondary | ICD-10-CM | POA: Insufficient documentation

## 2017-11-03 DIAGNOSIS — I214 Non-ST elevation (NSTEMI) myocardial infarction: Secondary | ICD-10-CM | POA: Insufficient documentation

## 2017-11-03 DIAGNOSIS — G4731 Primary central sleep apnea: Secondary | ICD-10-CM | POA: Diagnosis not present

## 2017-11-03 DIAGNOSIS — Z955 Presence of coronary angioplasty implant and graft: Secondary | ICD-10-CM | POA: Diagnosis not present

## 2017-11-03 DIAGNOSIS — E785 Hyperlipidemia, unspecified: Secondary | ICD-10-CM | POA: Insufficient documentation

## 2017-11-06 ENCOUNTER — Encounter (HOSPITAL_COMMUNITY)
Admission: RE | Admit: 2017-11-06 | Discharge: 2017-11-06 | Disposition: A | Discharge status: Home / Self Care | Payer: PPO | Source: Ambulatory Visit | Attending: Cardiovascular Disease | Admitting: Cardiovascular Disease

## 2017-11-06 DIAGNOSIS — Z955 Presence of coronary angioplasty implant and graft: Secondary | ICD-10-CM

## 2017-11-06 DIAGNOSIS — I214 Non-ST elevation (NSTEMI) myocardial infarction: Secondary | ICD-10-CM

## 2017-11-06 NOTE — Progress Notes (Signed)
John Blackburn 72 y.o. male DOB 09/22/1945 MRN [id number]       Nutrition  1. NSTEMI (non-ST elevated myocardial infarction) (HCC)08/30/17   2. 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 Nutrition Plan and Nutrition Survey goals reviewed with pt. Pt is following step 1 of the TLC diet. Pt wants to lose wt. Wt loss tips reviewed. Pt is pre-diabetic according to his last A1c. Pt is aware of pre-diabetes dx. Pt expressed understanding of the information reviewed. Pt aware of nutrition education classes offered.  Nutrition Diagnosis ? Food-and nutrition-related knowledge deficit related to lack of exposure to information as related to diagnosis of: ? CVD ? Pre-DM ? Obesity related to excessive energy intake as evidenced by a BMI of 31.9  Nutrition Intervention ? Pt's individual nutrition plan reviewed with pt. ? Benefits of adopting Heart Healthy diet discussed when Medficts reviewed.   ? Pt given handouts for: ? Nutrition I class ? Nutrition II class  ? pre-diabetes  Goal(s) ? Pt to identify and limit food sources of saturated fat, trans fat, and sodium ? Pt to identify food quantities necessary to achieve weight loss of 6-24 lb at graduation from cardiac rehab.   Plan:  Pt to attend nutrition classes ? Nutrition I ? Nutrition II ? Portion Distortion  Will provide client-centered nutrition education as part of interdisciplinary care.   Monitor and evaluate progress toward nutrition goal with team.  Derek Mound, M.Ed, RD, LDN, CDE 11/06/2017 3:46 PM

## 2017-11-08 ENCOUNTER — Encounter (HOSPITAL_COMMUNITY): Payer: PPO

## 2017-11-08 ENCOUNTER — Ambulatory Visit: Payer: Self-pay

## 2017-11-08 NOTE — Telephone Encounter (Signed)
Patient called with c/o "vomiting." He says "I think I took an extra dose of my Lipitor yesterday with supper. I started vomiting last night around 8 pm. I vomited about 10 times today, with the last about 30 minutes ago. I had diarrhea about 3-4 times today." I asked about abdominal pain, he denies. I asked is he able to keep fluids down, he said "no, not today. I ate supper last night and was fine." I asked about urination, he said "I did about 30 minutes ago. I don't feel dehydrated." He denies dizziness, vertigo, fever, vomiting blood or coffee ground.  According to protocol, go to ED. I advised to go to ED. I asked does he have someone to drive, he said "no, but I can drive myself. I will go to the urgent care beside [facility], because the emergency room is so expensive." Care advice given, patient verbalized understanding.  Reason for Disposition . [1] SEVERE vomiting (e.g., 6 or more times/day) AND [2] present > 8 hours  Answer Assessment - Initial Assessment Questions 1. VOMITING SEVERITY: "How many times have you vomited in the past 24 hours?"     - MILD:  1 - 2 times/day    - MODERATE: 3 - 5 times/day, decreased oral intake without significant weight loss or symptoms of dehydration    - SEVERE: 6 or more times/day, vomits everything or nearly everything, with significant weight loss, symptoms of dehydration      Severe-vomited 10 times 2. ONSET: "When did the vomiting begin?"      Yesterday evening around 8 pm 3. FLUIDS: "What fluids or food have you vomited up today?" "Have you been able to keep any fluids down?"     Not today 4. ABDOMINAL PAIN: "Are your having any abdominal pain?" If yes : "How bad is it and what does it feel like?" (e.g., crampy, dull, intermittent, constant)      No 5. DIARRHEA: "Is there any diarrhea?" If so, ask: "How many times today?"      Yes-3-4 times 6. CONTACTS: "Is there anyone else in the family with the same symptoms?"      No 7. CAUSE: "What do you  think is causing your vomiting?"     Think took an extra Lipitor yesterday at supper 8. HYDRATION STATUS: "Any signs of dehydration?" (e.g., dry mouth [not only dry lips], too weak to stand) "When did you last urinate?"     No; last urination 9. OTHER SYMPTOMS: "Do you have any other symptoms?" (e.g., fever, headache, vertigo, vomiting blood or coffee grounds, recent head injury)     No 10. PREGNANCY: "Is there any chance you are pregnant?" "When was your last menstrual period?"       N/A  Protocols used: [facility]

## 2017-11-09 NOTE — Telephone Encounter (Signed)
noted 

## 2017-11-10 ENCOUNTER — Encounter (HOSPITAL_COMMUNITY)
Admission: RE | Admit: 2017-11-10 | Discharge: 2017-11-10 | Disposition: A | Discharge status: Home / Self Care | Payer: PPO | Source: Ambulatory Visit

## 2017-11-10 ENCOUNTER — Encounter (HOSPITAL_COMMUNITY): Payer: PPO

## 2017-11-10 DIAGNOSIS — I214 Non-ST elevation (NSTEMI) myocardial infarction: Secondary | ICD-10-CM

## 2017-11-10 DIAGNOSIS — Z955 Presence of coronary angioplasty implant and graft: Secondary | ICD-10-CM

## 2017-11-13 ENCOUNTER — Encounter (HOSPITAL_COMMUNITY)
Admission: RE | Admit: 2017-11-13 | Discharge: 2017-11-13 | Disposition: A | Discharge status: Home / Self Care | Payer: PPO | Source: Ambulatory Visit | Attending: Cardiovascular Disease | Admitting: Cardiovascular Disease

## 2017-11-13 DIAGNOSIS — I214 Non-ST elevation (NSTEMI) myocardial infarction: Secondary | ICD-10-CM | POA: Diagnosis not present

## 2017-11-13 DIAGNOSIS — Z955 Presence of coronary angioplasty implant and graft: Secondary | ICD-10-CM

## 2017-11-15 ENCOUNTER — Encounter (HOSPITAL_COMMUNITY)
Admission: RE | Admit: 2017-11-15 | Discharge: 2017-11-15 | Disposition: A | Discharge status: Home / Self Care | Payer: PPO | Source: Ambulatory Visit | Attending: Cardiovascular Disease | Admitting: Cardiovascular Disease

## 2017-11-15 ENCOUNTER — Encounter (HOSPITAL_COMMUNITY): Payer: PPO

## 2017-11-15 DIAGNOSIS — I214 Non-ST elevation (NSTEMI) myocardial infarction: Secondary | ICD-10-CM | POA: Diagnosis not present

## 2017-11-15 NOTE — Progress Notes (Signed)
John Blackburn has been using a walker for stability at cardiac rehab and reports that he has been feeling somewhat unsteady with his gait the past 2-3 months. John Blackburn also reports that he sometimes has difficulty remembering things. John Blackburn's vital signs have been stable at cardiac rehab and doing well with exercise.  Dr Marko Plume office called. An appointment was made for [facility] to see Dr Sharlet Salina on Monday, March 25 at 10:45 AM. Will continue to monitor the patient throughout  the program.John Blackburn John Maxon, RN,BSN 11/15/2017 5:08 PM

## 2017-11-16 ENCOUNTER — Other Ambulatory Visit: Payer: Self-pay | Admitting: *Deleted

## 2017-11-16 ENCOUNTER — Telehealth: Payer: Self-pay | Admitting: *Deleted

## 2017-11-16 DIAGNOSIS — I2510 Atherosclerotic heart disease of native coronary artery without angina pectoris: Secondary | ICD-10-CM

## 2017-11-16 DIAGNOSIS — I214 Non-ST elevation (NSTEMI) myocardial infarction: Secondary | ICD-10-CM

## 2017-11-16 DIAGNOSIS — E785 Hyperlipidemia, unspecified: Secondary | ICD-10-CM

## 2017-11-16 DIAGNOSIS — Z9861 Coronary angioplasty status: Secondary | ICD-10-CM

## 2017-11-16 NOTE — Telephone Encounter (Signed)
-----   Message from Raiford Simmonds, RN sent at 09/18/2017 10:27 AM EST ----- NEED LABS DUE 12/13/17- CMP,LIPID FOR DR BERRY APPT (ORDER BY RHONDA BARRETT PA) MAIL@ 11/16/17 --LETTER AND LABSLIP

## 2017-11-16 NOTE — Patient Outreach (Signed)
[place] [facility]) Care Management  11/16/2017  TASHAWN LASWELL 02/06/1946 [id number]   RN Health Coach Monthly Outreach  Referral Date:  09/07/2017 Referral Source:  EMMI Alert Screen Reason for Referral:  Disease Management Education Insurance:  [employer]   Outreach Attempt:  Outreach attempt #1 to patient for monthly follow up. No answer. RN Health Coach left HIPAA compliant voicemail message along with contact information.  Plan:  RN Health Coach will make another outreach attempt to patient within one business day if no return call back from patient.  [place] [phone] [email or web address]

## 2017-11-16 NOTE — Telephone Encounter (Signed)
MAIL LETTER AND LABSLIP . ALSO PATIENT NEEDS ANA  APPOINTMENT WITH DR Gwenlyn Found.

## 2017-11-17 ENCOUNTER — Encounter (HOSPITAL_COMMUNITY): Payer: PPO

## 2017-11-17 ENCOUNTER — Encounter: Payer: Self-pay | Admitting: *Deleted

## 2017-11-17 ENCOUNTER — Other Ambulatory Visit: Payer: Self-pay | Admitting: *Deleted

## 2017-11-17 ENCOUNTER — Encounter (HOSPITAL_COMMUNITY)
Admission: RE | Admit: 2017-11-17 | Discharge: 2017-11-17 | Disposition: A | Discharge status: Home / Self Care | Payer: PPO | Source: Ambulatory Visit | Attending: Cardiovascular Disease | Admitting: Cardiovascular Disease

## 2017-11-17 DIAGNOSIS — Z955 Presence of coronary angioplasty implant and graft: Secondary | ICD-10-CM

## 2017-11-17 DIAGNOSIS — I214 Non-ST elevation (NSTEMI) myocardial infarction: Secondary | ICD-10-CM | POA: Diagnosis not present

## 2017-11-17 NOTE — Patient Outreach (Signed)
[facility]) Care Management  11/17/2017  John Blackburn 10-25-1945 [id number]   RN Health Coach Monthly Outreach  Referral Date:09/07/2017 Referral Source:EMMI Alert Screen Reason for Referral:Disease Management Education Insurance:[employer]   Outreach Attempt:  Outreach attempt #2 to patient for monthly follow up. No answer. RN Health Coach left HIPAA compliant voicemail message along with contact information.  Plan:  RN Health Coach will send unsuccessful outreach letter to patient.  RN Health Coach will make final telephone outreach attempt to patient within the next 10 business days.  [place] [phone] [email or web address]

## 2017-11-22 ENCOUNTER — Other Ambulatory Visit: Payer: Self-pay | Admitting: Cardiology

## 2017-11-22 ENCOUNTER — Encounter (HOSPITAL_COMMUNITY)
Admission: RE | Admit: 2017-11-22 | Discharge: 2017-11-22 | Disposition: A | Discharge status: Home / Self Care | Payer: PPO | Source: Ambulatory Visit | Attending: Cardiovascular Disease | Admitting: Cardiovascular Disease

## 2017-11-22 ENCOUNTER — Encounter (HOSPITAL_COMMUNITY): Payer: PPO

## 2017-11-22 DIAGNOSIS — Z955 Presence of coronary angioplasty implant and graft: Secondary | ICD-10-CM

## 2017-11-22 DIAGNOSIS — I214 Non-ST elevation (NSTEMI) myocardial infarction: Secondary | ICD-10-CM

## 2017-11-23 NOTE — Progress Notes (Signed)
Cardiac Individual Treatment Plan  Patient Details  Name: John Blackburn MRN: [id number] Date of Birth: 10-19-1945 Referring Provider:     CARDIAC REHAB PHASE II ORIENTATION from 10/03/2017 in [place]  Referring Provider  Dr Lorie Phenix       Initial Encounter Date:    CARDIAC REHAB PHASE II ORIENTATION from 10/03/2017 in [place]  Date  10/03/17  Referring Provider  Dr Lorie Phenix       Visit Diagnosis: NSTEMI (non-ST elevated myocardial infarction) (HCC)08/30/17  Status post coronary artery stent placement 09/01/17 S/P DES Mid RCA  Patient's Home Medications on Admission:  Current Outpatient Medications:  .  aspirin EC 81 MG EC tablet, Take 1 tablet (81 mg total) by mouth daily., Disp: , Rfl:  .  atenolol-chlorthalidone (TENORETIC) 50-25 MG tablet, TAKE ONE TABLET BY MOUTH ONCE DAILY, Disp: 90 tablet, Rfl: 3 .  atorvastatin (LIPITOR) 80 MG tablet, Take 1 tablet (80 mg total) by mouth daily at 6 PM., Disp: 90 tablet, Rfl: 1 .  buPROPion (WELLBUTRIN XL) 300 MG 24 hr tablet, TAKE 1 TABLET BY MOUTH ONCE DAILY, Disp: 30 tablet, Rfl: 2 .  clopidogrel (PLAVIX) 75 MG tablet, Take 1 tablet (75 mg total) by mouth daily., Disp: 90 tablet, Rfl: 2 .  furosemide (LASIX) 40 MG tablet, TAKE ONE TABLET BY MOUTH ONCE DAILY, Disp: 30 tablet, Rfl: 3 .  loratadine (CLARITIN) 10 MG tablet, Take 10 mg by mouth daily., Disp: , Rfl:  .  Melatonin 1 MG/ML LIQD, Take 2.5 mg by mouth at bedtime. , Disp: , Rfl:  .  nitroGLYCERIN (NITROSTAT) 0.4 MG SL tablet, Place 1 tablet (0.4 mg total) under the tongue every 5 (five) minutes as needed., Disp: 25 tablet, Rfl: 2 .  Oxymetazoline HCl (VICKS SINEX MOISTURIZING NA), Place 1 spray into the nose daily as needed (congestion)., Disp: , Rfl:  .  pantoprazole (PROTONIX) 40 MG tablet, Take 1 tablet (40 mg total) by mouth daily., Disp: 30 tablet, Rfl: 1 .  sertraline (ZOLOFT) 50 MG tablet,  TAKE ONE TABLET BY MOUTH ONCE DAILY, Disp: 30 tablet, Rfl: 2 .  tetrahydrozoline 0.05 % ophthalmic solution, Place 1 drop into both eyes daily as needed (dry eyes)., Disp: , Rfl:  .  Turmeric 500 MG CAPS, Take by mouth., Disp: , Rfl:   Past Medical History: Past Medical History:  Diagnosis Date  . Allergy   . 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 CAD (coronary artery disease)    08/2017 PCI/DES mRCA, RPDA, CTO pf pLCX with collaterals, normal EF  . Depression    on multiple meds. Has been seen at [facility]. and Dr. Sabra Heck is his prescriber  . Diverticulitis of colon 2000 's   treated as an outpatient  . GERD (gastroesophageal reflux disease)    UGI done August '12 - ulcer/. Dr. Ferdinand Lango, gastroenterologist in [facility].   [name] Hx of adenomatous colonic polyps   . Hyperlipidemia    last lipid panel: HDL 44, LDL 117  . Hypertension   . Peptic ulcer    in the past and just recently-August '12  . Pulmonary emboli [facility]    noted October 2014 - treated by Dr. Linda Hedges  . skin cancer    skin CA  . Sleep apnea, primary central    wears CPAP    Tobacco Use: Social History   Tobacco Use  Smoking Status Never Smoker  Smokeless Tobacco Never Used    Labs: Recent Review Flowsheet Data    Labs for ITP Cardiac and Pulmonary Rehab Latest Ref Rng & Units 01/17/2017 08/30/2017 09/01/2017 09/01/2017 09/01/2017   Cholestrol 0 - 200 mg/dL - - 166 - -   LDLCALC 0 - 99 mg/dL - - 114(H) - -   LDLDIRECT mg/dL - - - - -   HDL >40 mg/dL - - 30(L) - -   Trlycerides <150 mg/dL - - 109 - -   Hemoglobin A1c 4.6 - 6.5 % 5.9 - - - -   PHART 7.350 - 7.450 - - - 7.392 -   PCO2ART 32.0 - 48.0 mmHg - - - 41.5 -   HCO3 20.0 - 28.0 mmol/L - - - 25.2 26.1   TCO2 22 - 32 mmol/L - 28 - 26 27   O2SAT % - - - 97.0 71.0      Capillary Blood Glucose: No results found for: GLUCAP   Exercise Target Goals:    Exercise Program Goal: Individual exercise prescription set using results from  initial 6 min walk test and THRR while considering  patient's activity barriers and safety.   Exercise Prescription Goal: Initial exercise prescription builds to 30-45 minutes a day of aerobic activity, 2-3 days per week.  Home exercise guidelines will be given to patient during program as part of exercise prescription that the participant will acknowledge.  Activity Barriers & Risk Stratification: Activity Barriers & Cardiac Risk Stratification - 10/03/17 1457      Activity Barriers & Cardiac Risk Stratification   Activity Barriers  Right Knee Replacement;Left Knee Replacement;Arthritis;Muscular Weakness;Balance Concerns;Assistive Device;History of Falls       6 Minute Walk: 6 Minute Walk    Row Name 10/03/17 1441 10/03/17 1457       6 Minute Walk   Phase  Initial  -    Distance  897 feet  -    Walk Time  6 minutes  -    # of Rest Breaks  0  -    MPH  1.7  -    METS  1.56  -    RPE  13  -    VO2 Peak  5.5  -    Symptoms  No  -    Resting HR  56 bpm  -    Resting BP  104/56  -    Resting Oxygen Saturation   96 %  -    Exercise Oxygen Saturation  during 6 min walk  94 % encouraged PLB, O2 increased to 96%  -    Max Ex. HR  66 bpm  -    Max Ex. BP  124/74  -    2 Minute Post BP  -  104/60       Oxygen Initial Assessment:   Oxygen Re-Evaluation:   Oxygen Discharge (Final Oxygen Re-Evaluation):   Initial Exercise Prescription: Initial Exercise Prescription - 10/03/17 1500      Date of Initial Exercise RX and Referring Provider   Date  10/03/17    Referring Provider  Dr Lorie Phenix       NuStep   Level  1    SPM  50    Minutes  10    METs  1      Arm Ergometer   Level  1    RPM  10    Minutes  10    METs  1  Track   Laps  6    Minutes  10    METs  2.03      Prescription Details   Frequency (times per week)  3    Duration  Progress to 30 minutes of continuous aerobic without signs/symptoms of physical distress      Intensity   THRR 40-80%  of Max Heartrate  60-119    Ratings of Perceived Exertion  11-15    Perceived Dyspnea  0-4      Progression   Progression  Continue to progress workloads to maintain intensity without signs/symptoms of physical distress.      Resistance Training   Training Prescription  Yes    Weight  1lb    Reps  10-15       Perform Capillary Blood Glucose checks as needed.  Exercise Prescription Changes:  Exercise Prescription Changes    Row Name 10/25/17 1502 11/17/17 1504           Response to Exercise   Blood Pressure (Admit)  109/72  100/60      Blood Pressure (Exercise)  100/72  116/60      Blood Pressure (Exit)  118/60  100/62      Heart Rate (Admit)  55 bpm  62 bpm      Heart Rate (Exercise)  94 bpm  75 bpm      Heart Rate (Exit)  55 bpm  60 bpm      Rating of Perceived Exertion (Exercise)  13  11      Symptoms  none  none      Duration  Progress to 30 minutes of  aerobic without signs/symptoms of physical distress  Progress to 30 minutes of  aerobic without signs/symptoms of physical distress      Intensity  THRR unchanged  THRR unchanged        Progression   Progression  Continue to progress workloads to maintain intensity without signs/symptoms of physical distress.  Continue to progress workloads to maintain intensity without signs/symptoms of physical distress.      Average METs  1.6  2.1        Resistance Training   Training Prescription  No Relaxation today, no weights.  Yes      Weight  -  2lbs      Reps  -  10-15      Time  -  10 Minutes        Interval Training   Interval Training  No  No        NuStep   Level  2  2      SPM  68  68      Minutes  10  10      METs  1.6  1.6        Arm Ergometer   Level  1  1 25  watts      Minutes  10  10      METs  1.5  2.25        Track   Laps  -  9      Minutes  10  10      METs  -  2.57         Exercise Comments:  Exercise Comments    Row Name 10/25/17 1632 11/21/17 1539         Exercise Comments  Off to a  good start with exercise.  Making slow progress with exercise. Increase workloads as  tolerated.         Exercise Goals and Review:  Exercise Goals    Row Name 10/03/17 1357             Exercise Goals   Increase Physical Activity  Yes       Intervention  Provide advice, education, support and counseling about physical activity/exercise needs.;Develop an individualized exercise prescription for aerobic and resistive training based on initial evaluation findings, risk stratification, comorbidities and participant's personal goals.       Expected Outcomes  Short Term: Attend rehab on a regular basis to increase amount of physical activity.;Long Term: Exercising regularly at least 3-5 days a week.;Long Term: Add in home exercise to make exercise part of routine and to increase amount of physical activity.       Increase Strength and Stamina  Yes be able to walk 1 mile       Intervention  Provide advice, education, support and counseling about physical activity/exercise needs.;Develop an individualized exercise prescription for aerobic and resistive training based on initial evaluation findings, risk stratification, comorbidities and participant's personal goals.       Expected Outcomes  Short Term: Increase workloads from initial exercise prescription for resistance, speed, and METs.;Short Term: Perform resistance training exercises routinely during rehab and add in resistance training at home;Long Term: Improve cardiorespiratory fitness, muscular endurance and strength as measured by increased METs and functional capacity (6MWT)       Able to understand and use rate of perceived exertion (RPE) scale  Yes       Intervention  Provide education and explanation on how to use RPE scale       Expected Outcomes  Short Term: Able to use RPE daily in rehab to express subjective intensity level;Long Term:  Able to use RPE to guide intensity level when exercising independently       Knowledge and  understanding of Target Heart Rate Range (THRR)  Yes       Intervention  Provide education and explanation of THRR including how the numbers were predicted and where they are located for reference       Expected Outcomes  Short Term: Able to state/look up THRR;Long Term: Able to use THRR to govern intensity when exercising independently;Short Term: Able to use daily as guideline for intensity in rehab       Able to check pulse independently  Yes       Intervention  Provide education and demonstration on how to check pulse in carotid and radial arteries.;Review the importance of being able to check your own pulse for safety during independent exercise       Expected Outcomes  Short Term: Able to explain why pulse checking is important during independent exercise;Long Term: Able to check pulse independently and accurately       Understanding of Exercise Prescription  Yes       Intervention  Provide education, explanation, and written materials on patient's individual exercise prescription       Expected Outcomes  Short Term: Able to explain program exercise prescription;Long Term: Able to explain home exercise prescription to exercise independently          Exercise Goals Re-Evaluation : Exercise Goals Re-Evaluation    Kyle Name 10/25/17 1631 11/21/17 1538           Exercise Goal Re-Evaluation   Exercise Goals Review  Increase Physical Activity;Able to understand and use rate of perceived exertion (RPE) scale  Increase Physical Activity  Comments  Patient tolerating low intensity exercise without c/o. Patient understands and is able to use the RPE scale appropriately.  Patient continues to make slow progress with exercise.      Expected Outcomes  Increase workloads as tolerated to help achieve health and fitness goals.  Continue increasing workloads as tolerated.          Discharge Exercise Prescription (Final Exercise Prescription Changes): Exercise Prescription Changes - 11/17/17 1504       Response to Exercise   Blood Pressure (Admit)  100/60    Blood Pressure (Exercise)  116/60    Blood Pressure (Exit)  100/62    Heart Rate (Admit)  62 bpm    Heart Rate (Exercise)  75 bpm    Heart Rate (Exit)  60 bpm    Rating of Perceived Exertion (Exercise)  11    Symptoms  none    Duration  Progress to 30 minutes of  aerobic without signs/symptoms of physical distress    Intensity  THRR unchanged      Progression   Progression  Continue to progress workloads to maintain intensity without signs/symptoms of physical distress.    Average METs  2.1      Resistance Training   Training Prescription  Yes    Weight  2lbs    Reps  10-15    Time  10 Minutes      Interval Training   Interval Training  No      NuStep   Level  2    SPM  68    Minutes  10    METs  1.6      Arm Ergometer   Level  1 25 watts    Minutes  10    METs  2.25      Track   Laps  9    Minutes  10    METs  2.57       Nutrition:  Target Goals: Understanding of nutrition guidelines, daily intake of sodium 1500mg , cholesterol 200mg , calories 30% from fat and 7% or less from saturated fats, daily to have 5 or more servings of fruits and vegetables.  Biometrics: Pre Biometrics - 10/03/17 1442      Pre Biometrics   Height  6' (1.829 m)    Weight  235 lb 7.2 oz (106.8 kg)    Waist Circumference  42.5 inches    Hip Circumference  44.5 inches    Waist to Hip Ratio  0.96 %    BMI (Calculated)  31.93    Triceps Skinfold  18 mm    % Body Fat  30.7 %    Grip Strength  27 kg    Flexibility  9 in    Single Leg Stand  1 seconds        Nutrition Therapy Plan and Nutrition Goals: Nutrition Therapy & Goals - 10/04/17 1157      Nutrition Therapy   Diet  Heart Healthy      Intervention Plan   Intervention  Prescribe, educate and counsel regarding individualized specific dietary modifications aiming towards targeted core components such as weight, hypertension, lipid management, diabetes, heart  failure and other comorbidities.    Expected Outcomes  Short Term Goal: Understand basic principles of dietary content, such as calories, fat, sodium, cholesterol and nutrients.;Long Term Goal: Adherence to prescribed nutrition plan.       Nutrition Assessments: Nutrition Assessments - 11/06/17 1545      MEDFICTS Scores   Pre  Score  48       Nutrition Goals Re-Evaluation:   Nutrition Goals Re-Evaluation:   Nutrition Goals Discharge (Final Nutrition Goals Re-Evaluation):   Psychosocial: Target Goals: Acknowledge presence or absence of significant depression and/or stress, maximize coping skills, provide positive support system. Participant is able to verbalize types and ability to use techniques and skills needed for reducing stress and depression.  Initial Review & Psychosocial Screening: Initial Psych Review & Screening - 10/03/17 1545      Initial Review   Current issues with  History of Depression      Family Dynamics   Good Support System?  Yes    Concerns  -- Lorenda Ishihara says he went through a bad divorce 14 years ago      Barriers   Psychosocial barriers to participate in program  The patient should benefit from training in stress management and relaxation.      Screening Interventions   Interventions  Encouraged to exercise;To provide support and resources with identified psychosocial needs    Expected Outcomes  Long Term Goal: Stressors or current issues are controlled or eliminated.;Short Term goal: Identification and review with participant of any Quality of Life or Depression concerns found by scoring the questionnaire.;Long Term goal: The participant improves quality of Life and PHQ9 Scores as seen by post scores and/or verbalization of changes       Quality of Life Scores: Quality of Life - 10/24/17 1419      Quality of Life Scores   Health/Function Pre  19.23 %    Socioeconomic Pre  18.21 %    Psych/Spiritual Pre  25 %    Family Pre  19.38 %    GLOBAL Pre   20.26 %      Scores of 19 and below usually indicate a poorer quality of life in these areas.  A difference of  2-3 points is a clinically meaningful difference.  A difference of 2-3 points in the total score of the Quality of Life Index has been associated with significant improvement in overall quality of life, self-image, physical symptoms, and general health in studies assessing change in quality of life.  PHQ-9: Recent Review Flowsheet Data    Depression screen [facility] 2/9 10/23/2017 10/17/2017 10/03/2017 10/03/2017 09/13/2017   Decreased Interest 1 1 (No Data)  0 0   Down, Depressed, Hopeless 3 3 - 0 0   PHQ - 2 Score 4 4 - 0 0   Altered sleeping 0 0 - - -   Tired, decreased energy 1 1 - - -   Change in appetite 0 0 - - -   Feeling bad or failure about yourself  3 3 - - -   Trouble concentrating 0 0 - - -   Moving slowly or fidgety/restless 0 0 - - -   Suicidal thoughts 1 1 - - -   PHQ-9 Score 9 9 - - -   Difficult doing work/chores Somewhat difficult Somewhat difficult - - -     Interpretation of Total Score  Total Score Depression Severity:  1-4 = Minimal depression, 5-9 = Mild depression, 10-14 = Moderate depression, 15-19 = Moderately severe depression, 20-27 = Severe depression   Psychosocial Evaluation and Intervention:   Psychosocial Re-Evaluation: Psychosocial Re-Evaluation    Row Name 10/26/17 1522 11/23/17 1528           Psychosocial Re-Evaluation   Current issues with  History of Depression  History of Depression      Comments  will review QOL scores with the patient this week  John Blackburn has an appointment to discuss some recent problems with his memory with Dr Sharlet Salina on 11/27/17      Interventions  Stress management education  Encouraged to attend Pulmonary Rehabilitation for the exercise;Encouraged to attend Cardiac Rehabilitation for the exercise      Continue Psychosocial Services   Follow up required by staff  Follow up required by staff         Psychosocial  Discharge (Final Psychosocial Re-Evaluation): Psychosocial Re-Evaluation - 11/23/17 1528      Psychosocial Re-Evaluation   Current issues with  History of Depression    Comments  John Blackburn has an appointment to discuss some recent problems with his memory with Dr Sharlet Salina on 11/27/17    Interventions  Encouraged to attend Pulmonary Rehabilitation for the exercise;Encouraged to attend Cardiac Rehabilitation for the exercise    Continue Psychosocial Services   Follow up required by staff       Vocational Rehabilitation: Provide vocational rehab assistance to qualifying candidates.   Vocational Rehab Evaluation & Intervention: Vocational Rehab - 10/03/17 1543      Initial Vocational Rehab Evaluation & Intervention   Assessment shows need for Vocational Rehabilitation  Yes Lorenda Ishihara is retired but says he is interested in vocational rehab for job retraining     Vocational Rehab Packet given to patient  10/03/17       Education: Education Goals: Education classes will be provided on a weekly basis, covering required topics. Participant will state understanding/return demonstration of topics presented.  Learning Barriers/Preferences: Learning Barriers/Preferences - 10/03/17 1459      Learning Barriers/Preferences   Learning Barriers  Hearing;Sight       Education Topics: Count Your Pulse:  -Group instruction provided by verbal instruction, demonstration, patient participation and written materials to support subject.  Instructors address importance of being able to find your pulse and how to count your pulse when at home without a heart monitor.  Patients get hands on experience counting their pulse with staff help and individually.   Heart Attack, Angina, and Risk Factor Modification:  -Group instruction provided by verbal instruction, video, and written materials to support subject.  Instructors address signs and symptoms of angina and heart attacks.    Also discuss risk factors for heart  disease and how to make changes to improve heart health risk factors.   Functional Fitness:  -Group instruction provided by verbal instruction, demonstration, patient participation, and written materials to support subject.  Instructors address safety measures for doing things around the house.  Discuss how to get up and down off the floor, how to pick things up properly, how to safely get out of a chair without assistance, and balance training.   CARDIAC REHAB PHASE II EXERCISE from 11/17/2017 in [place]  Date  11/17/17  Instruction Review Code  2- Demonstrated Understanding      Meditation and Mindfulness:  -Group instruction provided by verbal instruction, patient participation, and written materials to support subject.  Instructor addresses importance of mindfulness and meditation practice to help reduce stress and improve awareness.  Instructor also leads participants through a meditation exercise.    Stretching for Flexibility and Mobility:  -Group instruction provided by verbal instruction, patient participation, and written materials to support subject.  Instructors lead participants through series of stretches that are designed to increase flexibility thus improving mobility.  These stretches are additional exercise for major muscle groups that are typically performed  during regular warm up and cool down.   Hands Only CPR:  -Group verbal, video, and participation provides a basic overview of AHA guidelines for community CPR. Role-play of emergencies allow participants the opportunity to practice calling for help and chest compression technique with discussion of AED use.   Hypertension: -Group verbal and written instruction that provides a basic overview of hypertension including the most recent diagnostic guidelines, risk factor reduction with self-care instructions and medication management.    Nutrition I class: Heart Healthy Eating:  -Group  instruction provided by PowerPoint slides, verbal discussion, and written materials to support subject matter. The instructor gives an explanation and review of the Therapeutic Lifestyle Changes diet recommendations, which includes a discussion on lipid goals, dietary fat, sodium, fiber, plant stanol/sterol esters, sugar, and the components of a well-balanced, healthy diet.   Nutrition II class: Lifestyle Skills:  -Group instruction provided by PowerPoint slides, verbal discussion, and written materials to support subject matter. The instructor gives an explanation and review of label reading, grocery shopping for heart health, heart healthy recipe modifications, and ways to make healthier choices when eating out.   Diabetes Question & Answer:  -Group instruction provided by PowerPoint slides, verbal discussion, and written materials to support subject matter. The instructor gives an explanation and review of diabetes co-morbidities, pre- and post-prandial blood glucose goals, pre-exercise blood glucose goals, signs, symptoms, and treatment of hypoglycemia and hyperglycemia, and foot care basics.   CARDIAC REHAB PHASE II EXERCISE from 11/17/2017 in [place]  Date  10/27/17  Educator  RD  Instruction Review Code  2- Demonstrated Understanding      Diabetes Blitz:  -Group instruction provided by PowerPoint slides, verbal discussion, and written materials to support subject matter. The instructor gives an explanation and review of the physiology behind type 1 and type 2 diabetes, diabetes medications and rational behind using different medications, pre- and post-prandial blood glucose recommendations and Hemoglobin A1c goals, diabetes diet, and exercise including blood glucose guidelines for exercising safely.    Portion Distortion:  -Group instruction provided by PowerPoint slides, verbal discussion, written materials, and food models to support subject matter. The  instructor gives an explanation of serving size versus portion size, changes in portions sizes over the last 20 years, and what consists of a serving from each food group.   Stress Management:  -Group instruction provided by verbal instruction, video, and written materials to support subject matter.  Instructors review role of stress in heart disease and how to cope with stress positively.     Exercising on Your Own:  -Group instruction provided by verbal instruction, power point, and written materials to support subject.  Instructors discuss benefits of exercise, components of exercise, frequency and intensity of exercise, and end points for exercise.  Also discuss use of nitroglycerin and activating EMS.  Review options of places to exercise outside of rehab.  Review guidelines for sex with heart disease.   Cardiac Drugs I:  -Group instruction provided by verbal instruction and written materials to support subject.  Instructor reviews cardiac drug classes: antiplatelets, anticoagulants, beta blockers, and statins.  Instructor discusses reasons, side effects, and lifestyle considerations for each drug class.   CARDIAC REHAB PHASE II EXERCISE from 11/17/2017 in [place]  Date  11/15/17  Educator  -- [pharmacy]  Instruction Review Code  2- Demonstrated Understanding      Cardiac Drugs II:  -Group instruction provided by verbal instruction and written materials  to support subject.  Instructor reviews cardiac drug classes: angiotensin converting enzyme inhibitors (ACE-I), angiotensin II receptor blockers (ARBs), nitrates, and calcium channel blockers.  Instructor discusses reasons, side effects, and lifestyle considerations for each drug class.   Anatomy and Physiology of the Circulatory System:  Group verbal and written instruction and models provide basic cardiac anatomy and physiology, with the coronary electrical and arterial systems. Review of: AMI, Angina,  Valve disease, Heart Failure, Peripheral Artery Disease, Cardiac Arrhythmia, Pacemakers, and the ICD.   Other Education:  -Group or individual verbal, written, or video instructions that support the educational goals of the cardiac rehab program.   Holiday Eating Survival Tips:  -Group instruction provided by PowerPoint slides, verbal discussion, and written materials to support subject matter. The instructor gives patients tips, tricks, and techniques to help them not only survive but enjoy the holidays despite the onslaught of food that accompanies the holidays.   Knowledge Questionnaire Score: Knowledge Questionnaire Score - 10/24/17 1420      Knowledge Questionnaire Score   Pre Score  21/24       Core Components/Risk Factors/Patient Goals at Admission: Personal Goals and Risk Factors at Admission - 10/03/17 1457      Core Components/Risk Factors/Patient Goals on Admission    Weight Management  Yes;Obesity;Weight Maintenance    Intervention  Weight Management: Develop a combined nutrition and exercise program designed to reach desired caloric intake, while maintaining appropriate intake of nutrient and fiber, sodium and fats, and appropriate energy expenditure required for the weight goal.;Weight Management/Obesity: Establish reasonable short term and long term weight goals.;Obesity: Provide education and appropriate resources to help participant work on and attain dietary goals.;Weight Management: Provide education and appropriate resources to help participant work on and attain dietary goals.    Admit Weight  235 lb 7.2 oz (106.8 kg)    Goal Weight: Short Term  230 lb (104.3 kg)    Goal Weight: Long Term  225 lb (102.1 kg)    Expected Outcomes  Short Term: Continue to assess and modify interventions until short term weight is achieved;Long Term: Adherence to nutrition and physical activity/exercise program aimed toward attainment of established weight goal;Weight Loss: Understanding  of general recommendations for a balanced deficit meal plan, which promotes 1-2 lb weight loss per week and includes a negative energy balance of [phone] kcal/d;Weight Maintenance: Understanding of the daily nutrition guidelines, which includes 25-35% calories from fat, 7% or less cal from saturated fats, less than 200mg  cholesterol, less than 1.5gm of sodium, & 5 or more servings of fruits and vegetables daily;Understanding recommendations for meals to include 15-35% energy as protein, 25-35% energy from fat, 35-60% energy from carbohydrates, less than 200mg  of dietary cholesterol, 20-35 gm of total fiber daily;Understanding of distribution of calorie intake throughout the day with the consumption of 4-5 meals/snacks    Hypertension  Yes    Intervention  Provide education on lifestyle modifcations including regular physical activity/exercise, weight management, moderate sodium restriction and increased consumption of fresh fruit, vegetables, and low fat dairy, alcohol moderation, and smoking cessation.;Monitor prescription use compliance.    Expected Outcomes  Short Term: Continued assessment and intervention until BP is < 140/66mm HG in hypertensive participants. < 130/74mm HG in hypertensive participants with diabetes, heart failure or chronic kidney disease.;Long Term: Maintenance of blood pressure at goal levels.       Core Components/Risk Factors/Patient Goals Review:  Goals and Risk Factor Review    Row Name 10/26/17 1516 11/23/17 1523  Core Components/Risk Factors/Patient Goals Review   Personal Goals Review  Weight Management/Obesity;Hypertension  Weight Management/Obesity;Hypertension      Review  John Blackburn's vital signs have been stable at cardiac rehab so far  John Blackburn's vital signs have been stable at cardiac rehab so far      Expected Outcomes  John Blackburn will continue to participate in phase 2 cardiac rehab and take his medcations as presribed  John Blackburn will continue to participate in phase 2  cardiac rehab and take his medcations as presribed         Core Components/Risk Factors/Patient Goals at Discharge (Final Review):  Goals and Risk Factor Review - 11/23/17 1523      Core Components/Risk Factors/Patient Goals Review   Personal Goals Review  Weight Management/Obesity;Hypertension    Review  John Blackburn's vital signs have been stable at cardiac rehab so far    Expected Outcomes  John Blackburn will continue to participate in phase 2 cardiac rehab and take his medcations as presribed       ITP Comments: ITP Comments    Row Name 10/03/17 1346 10/26/17 1513 11/23/17 1520       ITP Comments  Dr. Fransico Him, Medical Director  30 day ITP Review. Patient is off to a good start to exercise.  30 day ITP Review. John Blackburn has good partcipation and fair attendance in phase 2 cardiac rehab        Comments: See ITP comments.Barnet Pall, RN,BSN 11/23/2017 3:31 PM

## 2017-11-24 ENCOUNTER — Encounter (HOSPITAL_COMMUNITY)
Admission: RE | Admit: 2017-11-24 | Discharge: 2017-11-24 | Disposition: A | Discharge status: Home / Self Care | Payer: PPO | Source: Ambulatory Visit | Attending: Cardiovascular Disease | Admitting: Cardiovascular Disease

## 2017-11-24 ENCOUNTER — Encounter (HOSPITAL_COMMUNITY): Payer: PPO

## 2017-11-24 DIAGNOSIS — I214 Non-ST elevation (NSTEMI) myocardial infarction: Secondary | ICD-10-CM

## 2017-11-24 DIAGNOSIS — Z955 Presence of coronary angioplasty implant and graft: Secondary | ICD-10-CM

## 2017-11-25 DIAGNOSIS — G4733 Obstructive sleep apnea (adult) (pediatric): Secondary | ICD-10-CM | POA: Diagnosis not present

## 2017-11-27 ENCOUNTER — Ambulatory Visit (INDEPENDENT_AMBULATORY_CARE_PROVIDER_SITE_OTHER): Payer: PPO | Admitting: Internal Medicine

## 2017-11-27 ENCOUNTER — Encounter: Payer: Self-pay | Admitting: Internal Medicine

## 2017-11-27 ENCOUNTER — Encounter (HOSPITAL_COMMUNITY)
Admission: RE | Admit: 2017-11-27 | Discharge: 2017-11-27 | Disposition: A | Discharge status: Home / Self Care | Payer: PPO | Source: Ambulatory Visit | Attending: Cardiovascular Disease | Admitting: Cardiovascular Disease

## 2017-11-27 ENCOUNTER — Other Ambulatory Visit (INDEPENDENT_AMBULATORY_CARE_PROVIDER_SITE_OTHER): Payer: PPO

## 2017-11-27 VITALS — BP 100/62 | HR 55 | Temp 98.6°F | Ht 73.0 in | Wt 234.0 lb

## 2017-11-27 DIAGNOSIS — R7301 Impaired fasting glucose: Secondary | ICD-10-CM

## 2017-11-27 DIAGNOSIS — R2689 Other abnormalities of gait and mobility: Secondary | ICD-10-CM

## 2017-11-27 DIAGNOSIS — R413 Other amnesia: Secondary | ICD-10-CM | POA: Diagnosis not present

## 2017-11-27 DIAGNOSIS — E559 Vitamin D deficiency, unspecified: Secondary | ICD-10-CM

## 2017-11-27 DIAGNOSIS — E538 Deficiency of other specified B group vitamins: Secondary | ICD-10-CM

## 2017-11-27 DIAGNOSIS — F321 Major depressive disorder, single episode, moderate: Secondary | ICD-10-CM | POA: Diagnosis not present

## 2017-11-27 DIAGNOSIS — Z955 Presence of coronary angioplasty implant and graft: Secondary | ICD-10-CM

## 2017-11-27 DIAGNOSIS — I214 Non-ST elevation (NSTEMI) myocardial infarction: Secondary | ICD-10-CM

## 2017-11-27 LAB — COMPREHENSIVE METABOLIC PANEL
ALK PHOS: 79 U/L (ref 39–117)
ALT: 27 U/L (ref 0–53)
AST: 24 U/L (ref 0–37)
Albumin: 3.8 g/dL (ref 3.5–5.2)
BUN: 16 mg/dL (ref 6–23)
CALCIUM: 9.1 mg/dL (ref 8.4–10.5)
CHLORIDE: 97 meq/L (ref 96–112)
CO2: 35 mEq/L — ABNORMAL HIGH (ref 19–32)
CREATININE: 0.98 mg/dL (ref 0.40–1.50)
GFR: 79.98 mL/min (ref >60.00)
Glucose, Bld: 134 mg/dL — ABNORMAL HIGH (ref 70–99)
POTASSIUM: 2.9 meq/L — AB (ref 3.5–5.1)
Sodium: 140 mEq/L (ref 135–145)
TOTAL PROTEIN: 7.4 g/dL (ref 6.0–8.3)
Total Bilirubin: 0.6 mg/dL (ref 0.2–1.2)

## 2017-11-27 LAB — CBC
HEMATOCRIT: 42 % (ref 39.0–52.0)
Hemoglobin: 14.3 g/dL (ref 13.0–17.0)
MCHC: 34 g/dL (ref 30.0–36.0)
MCV: 96.2 fl (ref 78.0–100.0)
PLATELETS: 213 10*3/uL (ref 150.0–400.0)
RBC: 4.36 Mil/uL (ref 4.22–5.81)
RDW: 14.4 % (ref 11.5–15.5)
WBC: 7.7 10*3/uL (ref 4.0–10.5)

## 2017-11-27 LAB — VITAMIN D 25 HYDROXY (VIT D DEFICIENCY, FRACTURES): VITD: 17.16 ng/mL — ABNORMAL LOW (ref 30.00–100.00)

## 2017-11-27 LAB — VITAMIN B12: Vitamin B-12: 228 pg/mL (ref 211–911)

## 2017-11-27 LAB — T4, FREE: FREE T4: 0.82 ng/dL (ref 0.60–1.60)

## 2017-11-27 LAB — TSH: TSH: 1.56 u[IU]/mL (ref 0.35–4.50)

## 2017-11-27 LAB — HEMOGLOBIN A1C: HEMOGLOBIN A1C: 6.3 % (ref 4.6–6.5)

## 2017-11-27 MED ORDER — SERTRALINE HCL 100 MG PO TABS: 100.0000 mg | ORAL_TABLET | Freq: Every day | ORAL | 3 refills | Status: DC

## 2017-11-27 NOTE — Patient Instructions (Addendum)
We will get a scan of the brain and the labs to check for problems.   We will increase the dose of the sertraline (zoloft) to 2 pills daily (total 100 mg daily). We have sent in a new prescription with the higher dose the pharmacy.

## 2017-11-27 NOTE — Progress Notes (Signed)
   Subjective:    Patient ID: John Blackburn, male    DOB: Mar 19, 1946, 72 y.o.   MRN: [id number]  HPI The patient is a 72 YO man coming in for several concerns including balance problems (going on for about 3-5 months now, feels like someone is pushing him forward, was leaning forward and fell recently, denies injury from fall, denies vertigo symptoms, denies lightheadedness or dizziness, no syncope or pre-syncope, no changes to medicines during that time), and memory changes (having more problems with short term memory, losing track of objects and where he parked car, denies problems with paying bills, cooking, driving, denies accident or ticket recently, denies late payments, denies others noticing or mentioning this to him, able to read a passage but having a hard time focusing on the content as much, does have concurrent depression and feels that this is worse lately).   Review of Systems  Constitutional: Negative.   HENT: Negative.   Eyes: Negative.   Respiratory: Negative for cough, chest tightness and shortness of breath.   Cardiovascular: Negative for chest pain, palpitations and leg swelling.  Gastrointestinal: Negative for abdominal distention, abdominal pain, constipation, diarrhea, nausea and vomiting.  Musculoskeletal: Negative.   Skin: Negative.   Neurological: Positive for dizziness. Negative for tremors, seizures, syncope, speech difficulty, weakness, light-headedness and numbness.       Memory change  Psychiatric/Behavioral: Negative.       Objective:   Physical Exam  Constitutional: He is oriented to person, place, and time. He appears well-developed and well-nourished.  HENT:  Head: Normocephalic and atraumatic.  Eyes: EOM are normal.  Neck: Normal range of motion.  Cardiovascular: Normal rate and regular rhythm.  Pulmonary/Chest: Effort normal and breath sounds normal. No respiratory distress. He has no wheezes. He has no rales.  Abdominal: Soft. Bowel sounds are  normal. He exhibits no distension. There is no tenderness. There is no rebound.  Musculoskeletal: He exhibits no edema.  Neurological: He is alert and oriented to person, place, and time. No cranial nerve deficit. Coordination normal.  Cannot do serial 7s, world backwards normal, a and o times 3, memory 3/3 at 5 minutes  Skin: Skin is warm and dry.  Psychiatric:  Some flat affect   Vitals:   11/27/17 1055  BP: 100/62  Pulse: (!) 55  Temp: 98.6 F (37 C)  TempSrc: Oral  SpO2: 97%  Weight: 234 lb (106.1 kg)  Height: 6\' 1"  (1.854 m)      Assessment & Plan:

## 2017-11-28 DIAGNOSIS — R2689 Other abnormalities of gait and mobility: Secondary | ICD-10-CM | POA: Insufficient documentation

## 2017-11-28 DIAGNOSIS — R413 Other amnesia: Secondary | ICD-10-CM | POA: Insufficient documentation

## 2017-11-28 NOTE — Assessment & Plan Note (Signed)
Taking wellbutrin and zoloft. Will increase zoloft to 100 mg daily and keep wellbutrin dosing the same. Suspect some component of depression causing the memory changes. If no improvement or other cause will send for neuropsych testing and ask to see psych again if no improvement in mental health.

## 2017-11-28 NOTE — Assessment & Plan Note (Signed)
Ears are clear and given cardiac history concern for posterior stroke. He is out of window for intervention as symptoms ongoing for several months at this time. Ordered CT head.

## 2017-11-28 NOTE — Assessment & Plan Note (Signed)
Ordered labs to rule out metabolic etiology. Checking CT head as none recent. Concern for stroke with the balance problems which could have vascular changes in the brain. He does have depression concurrent and this is worse lately. He is able to remember with focus so could be from depression some.

## 2017-11-29 ENCOUNTER — Encounter: Payer: Self-pay | Admitting: Internal Medicine

## 2017-11-29 ENCOUNTER — Encounter (HOSPITAL_COMMUNITY)
Admission: RE | Admit: 2017-11-29 | Discharge: 2017-11-29 | Disposition: A | Discharge status: Home / Self Care | Payer: PPO | Source: Ambulatory Visit | Attending: Cardiovascular Disease | Admitting: Cardiovascular Disease

## 2017-11-29 ENCOUNTER — Encounter (HOSPITAL_COMMUNITY): Payer: PPO

## 2017-11-29 DIAGNOSIS — Z955 Presence of coronary angioplasty implant and graft: Secondary | ICD-10-CM

## 2017-11-29 DIAGNOSIS — I214 Non-ST elevation (NSTEMI) myocardial infarction: Secondary | ICD-10-CM

## 2017-11-30 ENCOUNTER — Other Ambulatory Visit: Payer: Self-pay | Admitting: *Deleted

## 2017-11-30 NOTE — Patient Outreach (Signed)
John Blackburn) Care Management  11/30/2017  John Blackburn Apr 17, 1946 [id number]   [place] Monthly Outreach  Referral Date:09/07/2017 Referral Source:EMMI Alert Screen Reason for Referral:Disease Management Education Insurance:[employer]   Outreach Attempt:  Outreach attempt #3 to patient for monthly follow up. No answer. RN Health Coach left HIPAA compliant voicemail message along with contact information.  Plan:  RN Health Coach will attempt final telephone outreach this afternoon.  [place] [phone] [email or web address]

## 2017-11-30 NOTE — Patient Outreach (Signed)
[facility]) Care Management  11/30/2017  John Blackburn July 06, 1946 [id number]   [place] Monthly Outreach  Referral Date:09/07/2017 Referral Source:EMMI Alert Screen Reason for Referral:Disease Management Education Insurance:[employer]   Outreach Attempt:  Multiple attempts to establish contact with patient without success. No response from letter mailed to patient. Case is being closed at this time.   Plan:   RN Health Coach will close case at this time due to inability to maintain contact with patient. RN Health Coach will send MD case closure letter. RN Health Coach will send patient case closure letter.  [place] [phone] [name]

## 2017-12-01 ENCOUNTER — Other Ambulatory Visit: Payer: Self-pay | Admitting: Internal Medicine

## 2017-12-01 ENCOUNTER — Encounter (HOSPITAL_COMMUNITY): Payer: PPO

## 2017-12-01 MED ORDER — VITAMIN D (ERGOCALCIFEROL) 1.25 MG (50000 UNIT) PO CAPS: 50000.0000 [IU] | ORAL_CAPSULE | ORAL | 0 refills | Status: DC

## 2017-12-01 MED ORDER — POTASSIUM CHLORIDE CRYS ER 20 MEQ PO TBCR: 40.0000 meq | EXTENDED_RELEASE_TABLET | Freq: Every day | ORAL | 3 refills | Status: DC

## 2017-12-04 ENCOUNTER — Encounter (HOSPITAL_COMMUNITY)
Admission: RE | Admit: 2017-12-04 | Discharge: 2017-12-04 | Disposition: A | Discharge status: Home / Self Care | Payer: PPO | Source: Ambulatory Visit | Attending: Cardiovascular Disease | Admitting: Cardiovascular Disease

## 2017-12-04 DIAGNOSIS — I10 Essential (primary) hypertension: Secondary | ICD-10-CM | POA: Diagnosis not present

## 2017-12-04 DIAGNOSIS — Z955 Presence of coronary angioplasty implant and graft: Secondary | ICD-10-CM | POA: Diagnosis not present

## 2017-12-04 DIAGNOSIS — E785 Hyperlipidemia, unspecified: Secondary | ICD-10-CM | POA: Diagnosis not present

## 2017-12-04 DIAGNOSIS — G4731 Primary central sleep apnea: Secondary | ICD-10-CM | POA: Diagnosis not present

## 2017-12-04 DIAGNOSIS — Z7982 Long term (current) use of aspirin: Secondary | ICD-10-CM | POA: Diagnosis not present

## 2017-12-04 DIAGNOSIS — Z7902 Long term (current) use of antithrombotics/antiplatelets: Secondary | ICD-10-CM | POA: Insufficient documentation

## 2017-12-04 DIAGNOSIS — I2510 Atherosclerotic heart disease of native coronary artery without angina pectoris: Secondary | ICD-10-CM | POA: Diagnosis not present

## 2017-12-04 DIAGNOSIS — F329 Major depressive disorder, single episode, unspecified: Secondary | ICD-10-CM | POA: Insufficient documentation

## 2017-12-04 DIAGNOSIS — K219 Gastro-esophageal reflux disease without esophagitis: Secondary | ICD-10-CM | POA: Diagnosis not present

## 2017-12-04 DIAGNOSIS — I214 Non-ST elevation (NSTEMI) myocardial infarction: Secondary | ICD-10-CM | POA: Insufficient documentation

## 2017-12-04 DIAGNOSIS — Z79899 Other long term (current) drug therapy: Secondary | ICD-10-CM | POA: Diagnosis not present

## 2017-12-06 ENCOUNTER — Encounter (HOSPITAL_COMMUNITY): Payer: PPO

## 2017-12-08 ENCOUNTER — Encounter (HOSPITAL_COMMUNITY): Payer: PPO

## 2017-12-08 ENCOUNTER — Ambulatory Visit (INDEPENDENT_AMBULATORY_CARE_PROVIDER_SITE_OTHER)
Admission: RE | Admit: 2017-12-08 | Discharge: 2017-12-08 | Disposition: A | Discharge status: Home / Self Care | Payer: PPO | Source: Ambulatory Visit | Attending: Internal Medicine | Admitting: Internal Medicine

## 2017-12-08 ENCOUNTER — Encounter (HOSPITAL_COMMUNITY)
Admission: RE | Admit: 2017-12-08 | Discharge: 2017-12-08 | Disposition: A | Discharge status: Home / Self Care | Payer: PPO | Source: Ambulatory Visit | Attending: Cardiovascular Disease | Admitting: Cardiovascular Disease

## 2017-12-08 DIAGNOSIS — R413 Other amnesia: Secondary | ICD-10-CM | POA: Diagnosis not present

## 2017-12-08 DIAGNOSIS — I214 Non-ST elevation (NSTEMI) myocardial infarction: Secondary | ICD-10-CM

## 2017-12-08 DIAGNOSIS — Z955 Presence of coronary angioplasty implant and graft: Secondary | ICD-10-CM

## 2017-12-08 IMAGING — CT CT HEAD W/O CM
3 series · 15 of 47 positions shown, 18 images · non-contrast
Comparison: None.

CLINICAL DATA: 71-year-old with disturbance in balance for
approximately 3-5 months, recent falls, and worsening short-term
memory.

EXAM:
CT HEAD WITHOUT CONTRAST
TECHNIQUE: Contiguous axial images were obtained from the base of the skull
through the vertex without intravenous contrast.

[Series 2: head 5.0 h37s · axial · 0.47mm/px · z∈[-155,-25]mm · 9 of 32 slices shown, 12 images]
[im 3/32  brain]
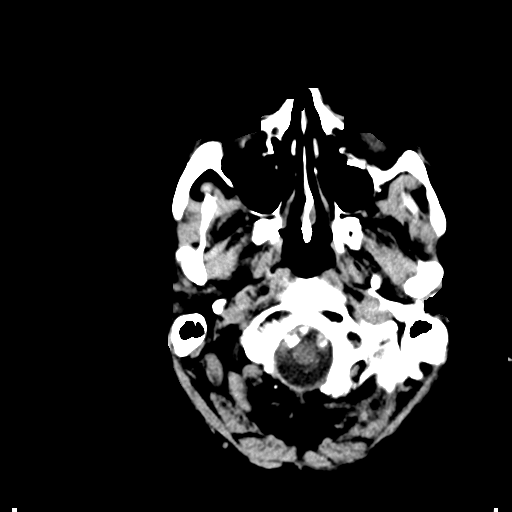
[im 3/32  bone]
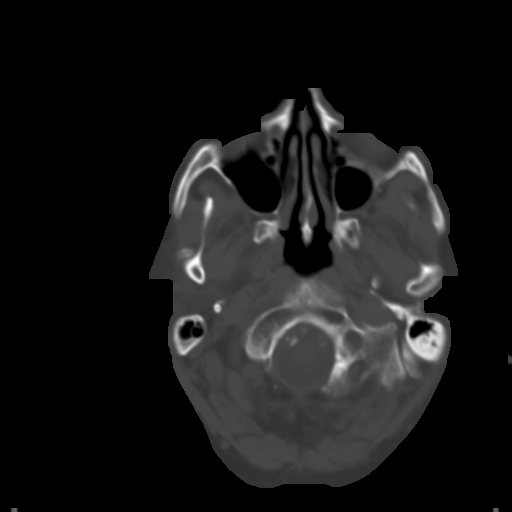
[im 6/32  brain]
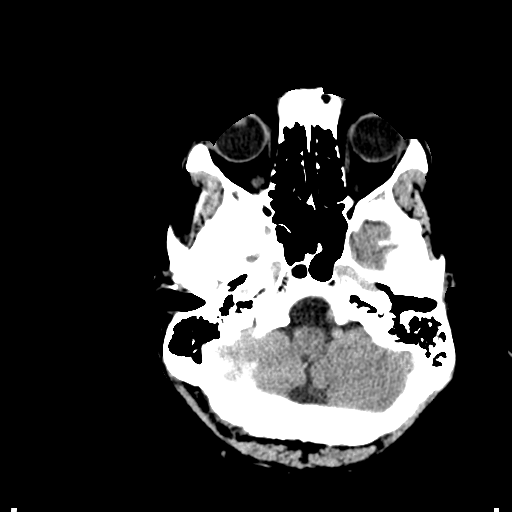
[im 9/32  brain]
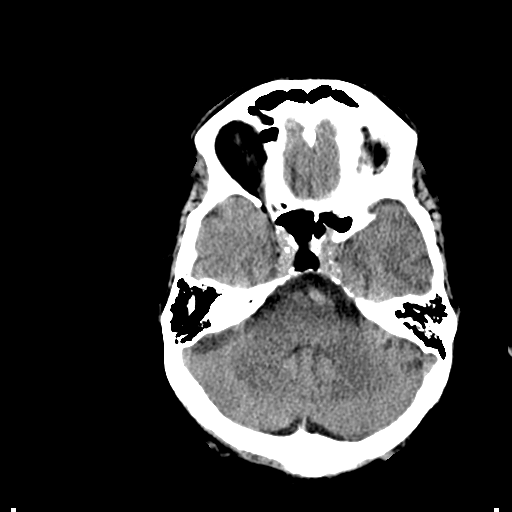
[im 12/32  brain]
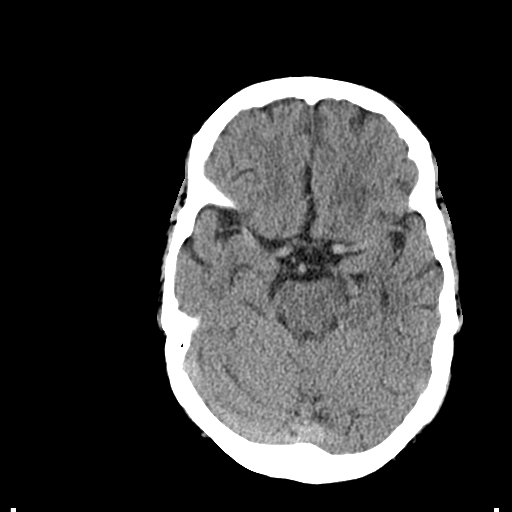
[im 17/32  brain]
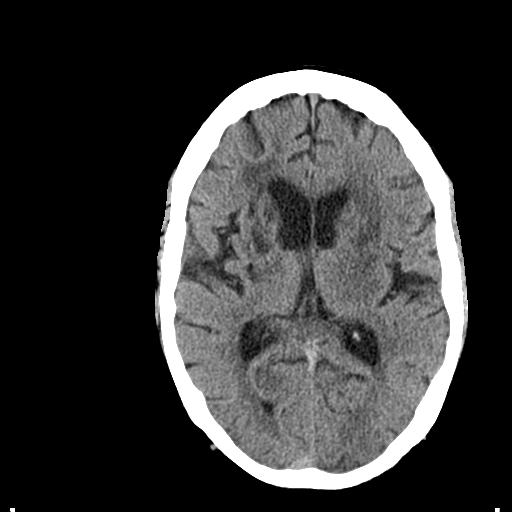
[im 17/32  bone]
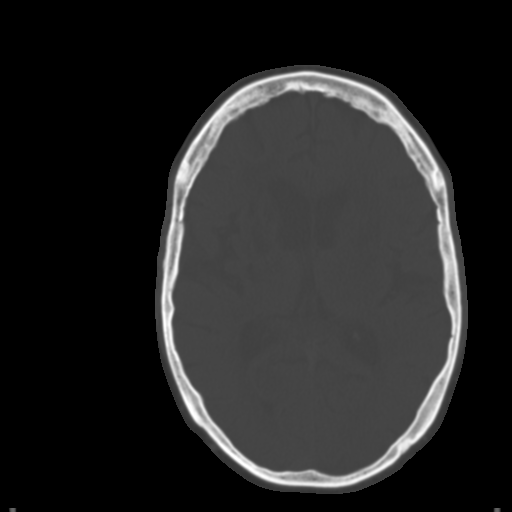
[im 20/32  brain]
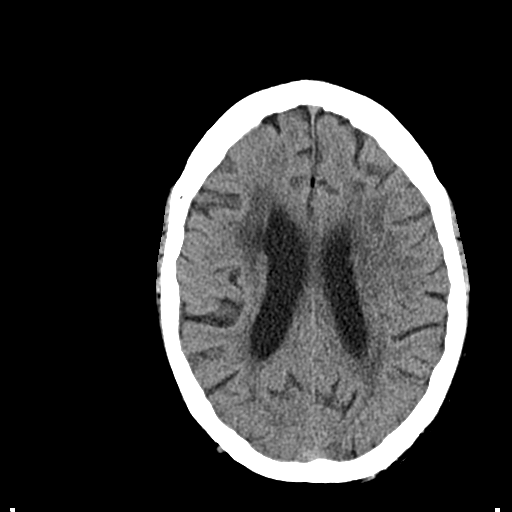
[im 23/32  brain]
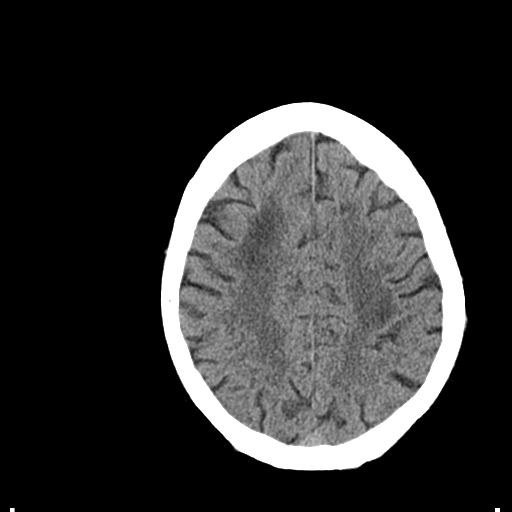
[im 26/32  brain]
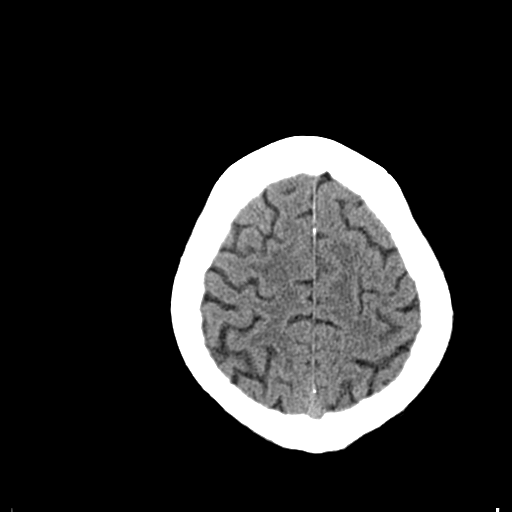
[im 29/32  brain]
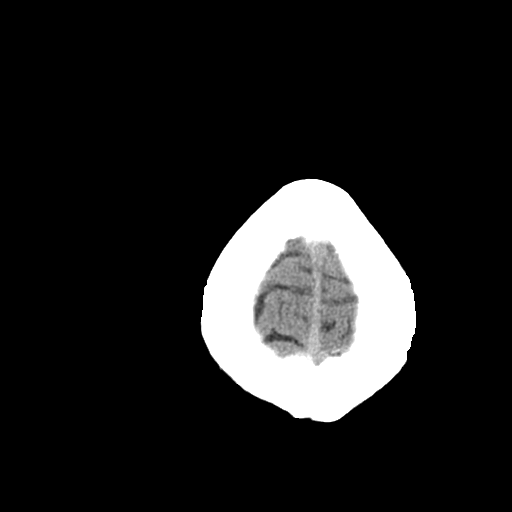
[im 29/32  bone]
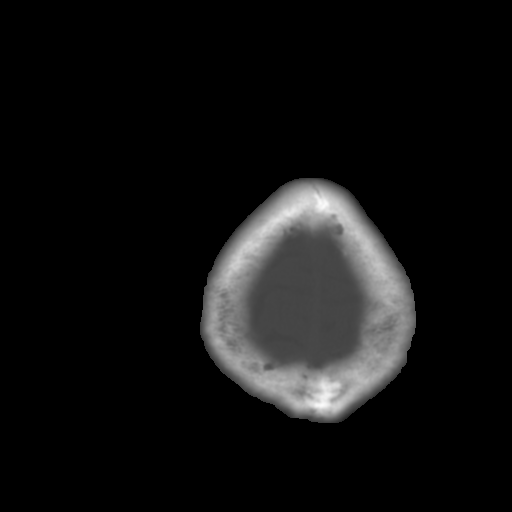

[Series 4: head 3.0 mpr coronal · coronal · 0.31mm/px · 3 of 72 slices shown]
[im 24/72  brain]
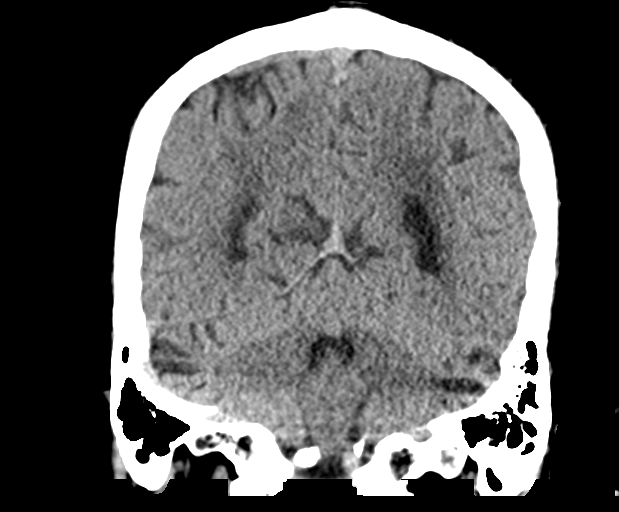
[im 32/72  brain]
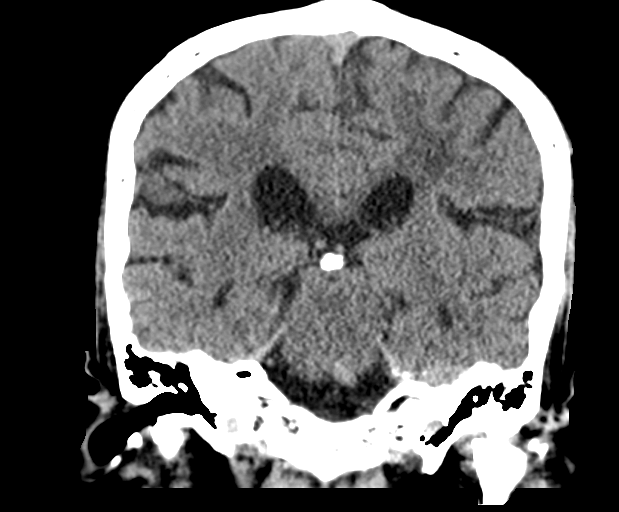
[im 40/72  brain]
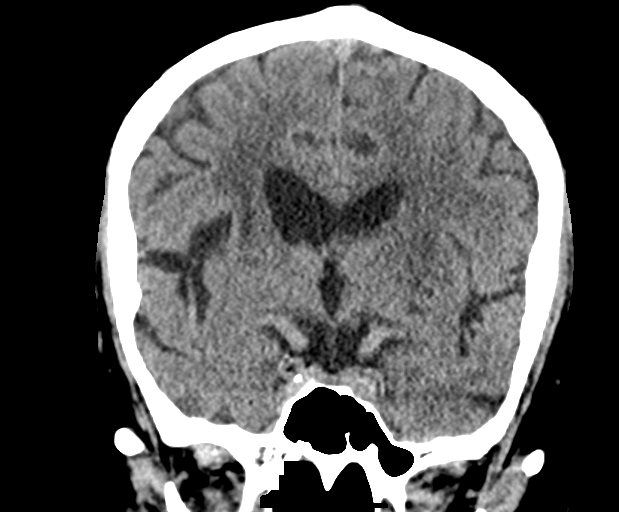

[Series 5: head 3.0 mpr sagittal · sagittal · 0.31mm/px · 3 of 53 slices shown]
[im 18/53  brain]
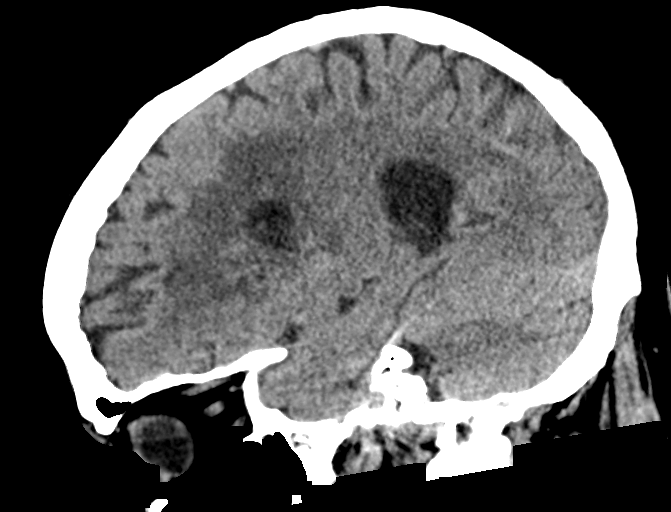
[im 27/53  brain]
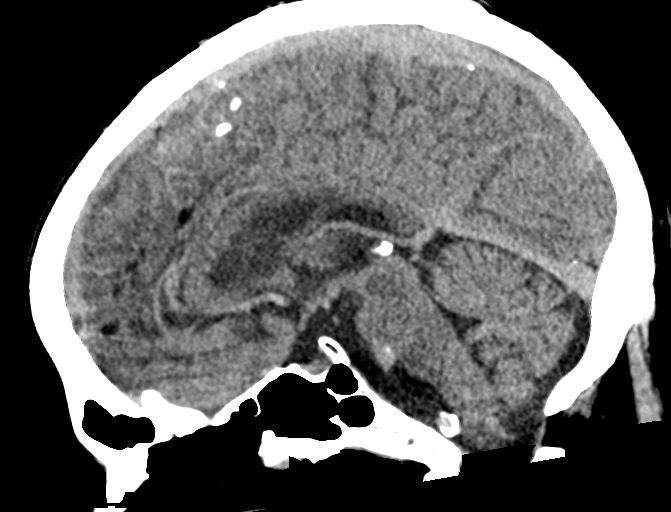
[im 35/53  brain]
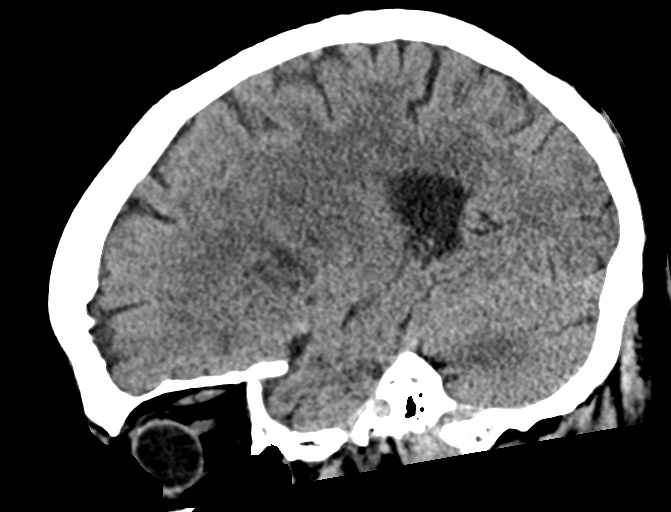

[15 of 47 positions shown; findings below may reference images not displayed]

FINDINGS: Brain: Mild age-appropriate cortical atrophy. Ventricular system
normal in appearance; asymmetric enlargement of the RIGHT LATERAL
ventricle is a developmental variant. Severe changes of small vessel
disease of the white matter diffusely with remote lacunar strokes
involving the basal ganglia bilaterally. No mass lesion. No midline
shift. No acute hemorrhage or hematoma. No extra-axial fluid
collections. No evidence of acute infarction.

Vascular: Mild BILATERAL carotid siphon and severe vertebrobasilar
atherosclerosis. No hyperdense vessel.

Skull: No skull fracture or other focal osseous abnormality
involving the skull.

Sinuses/Orbits: Visualized paranasal sinuses, bilateral mastoid air
cells and bilateral middle ear cavities well-aerated. Visualized
orbits and globes normal in appearance.

Other: None.
IMPRESSION: 1. No acute intracranial abnormality.
2. Severe chronic microvascular ischemic changes of the white matter
with remote lacunar strokes involving the basal ganglia bilaterally.

## 2017-12-11 ENCOUNTER — Encounter (HOSPITAL_COMMUNITY)
Admission: RE | Admit: 2017-12-11 | Discharge: 2017-12-11 | Disposition: A | Discharge status: Home / Self Care | Payer: PPO | Source: Ambulatory Visit | Attending: Cardiovascular Disease | Admitting: Cardiovascular Disease

## 2017-12-11 ENCOUNTER — Other Ambulatory Visit: Payer: Self-pay | Admitting: Internal Medicine

## 2017-12-11 DIAGNOSIS — I214 Non-ST elevation (NSTEMI) myocardial infarction: Secondary | ICD-10-CM

## 2017-12-11 DIAGNOSIS — Z955 Presence of coronary angioplasty implant and graft: Secondary | ICD-10-CM

## 2017-12-11 DIAGNOSIS — Z8673 Personal history of transient ischemic attack (TIA), and cerebral infarction without residual deficits: Secondary | ICD-10-CM

## 2017-12-13 ENCOUNTER — Ambulatory Visit: Payer: PPO | Admitting: Cardiovascular Disease

## 2017-12-13 ENCOUNTER — Encounter (HOSPITAL_COMMUNITY): Payer: PPO

## 2017-12-13 ENCOUNTER — Encounter (HOSPITAL_COMMUNITY)
Admission: RE | Admit: 2017-12-13 | Discharge: 2017-12-13 | Disposition: A | Discharge status: Home / Self Care | Payer: PPO | Source: Ambulatory Visit | Attending: Cardiovascular Disease | Admitting: Cardiovascular Disease

## 2017-12-13 DIAGNOSIS — I214 Non-ST elevation (NSTEMI) myocardial infarction: Secondary | ICD-10-CM | POA: Diagnosis not present

## 2017-12-15 ENCOUNTER — Encounter (HOSPITAL_COMMUNITY): Payer: PPO

## 2017-12-15 ENCOUNTER — Encounter (HOSPITAL_COMMUNITY)
Admission: RE | Admit: 2017-12-15 | Discharge: 2017-12-15 | Disposition: A | Discharge status: Home / Self Care | Payer: PPO | Source: Ambulatory Visit | Attending: Cardiovascular Disease | Admitting: Cardiovascular Disease

## 2017-12-15 DIAGNOSIS — I214 Non-ST elevation (NSTEMI) myocardial infarction: Secondary | ICD-10-CM

## 2017-12-15 DIAGNOSIS — Z955 Presence of coronary angioplasty implant and graft: Secondary | ICD-10-CM

## 2017-12-18 ENCOUNTER — Ambulatory Visit (HOSPITAL_COMMUNITY)
Admission: RE | Admit: 2017-12-18 | Discharge: 2017-12-18 | Disposition: A | Discharge status: Home / Self Care | Payer: PPO | Source: Ambulatory Visit | Attending: Cardiovascular Disease | Admitting: Cardiovascular Disease

## 2017-12-18 DIAGNOSIS — Z8673 Personal history of transient ischemic attack (TIA), and cerebral infarction without residual deficits: Secondary | ICD-10-CM | POA: Insufficient documentation

## 2017-12-20 ENCOUNTER — Encounter (HOSPITAL_COMMUNITY)
Admission: RE | Admit: 2017-12-20 | Discharge: 2017-12-20 | Disposition: A | Discharge status: Home / Self Care | Payer: PPO | Source: Ambulatory Visit | Attending: Cardiovascular Disease | Admitting: Cardiovascular Disease

## 2017-12-20 ENCOUNTER — Encounter (HOSPITAL_COMMUNITY): Payer: PPO

## 2017-12-20 ENCOUNTER — Encounter: Payer: Self-pay | Admitting: Internal Medicine

## 2017-12-20 DIAGNOSIS — Z955 Presence of coronary angioplasty implant and graft: Secondary | ICD-10-CM

## 2017-12-20 DIAGNOSIS — I214 Non-ST elevation (NSTEMI) myocardial infarction: Secondary | ICD-10-CM

## 2017-12-21 ENCOUNTER — Ambulatory Visit: Payer: PPO | Admitting: Internal Medicine

## 2017-12-21 ENCOUNTER — Encounter: Payer: Self-pay | Admitting: Internal Medicine

## 2017-12-21 DIAGNOSIS — Z9861 Coronary angioplasty status: Secondary | ICD-10-CM | POA: Diagnosis not present

## 2017-12-21 DIAGNOSIS — I2510 Atherosclerotic heart disease of native coronary artery without angina pectoris: Secondary | ICD-10-CM

## 2017-12-21 DIAGNOSIS — G4733 Obstructive sleep apnea (adult) (pediatric): Secondary | ICD-10-CM | POA: Diagnosis not present

## 2017-12-21 NOTE — Progress Notes (Signed)
HPI male never smoker followed for OSA, complicated by pulmonary embolism 2014, allergic rhinitis, arthritis, BPH, peripheral venous insufficiency, GERD, HBP, CHF/CAD/ NSTEMI  CPAP titration study 04/06/17-8 CWP, PLMA NPSG 07/26/08-AHI 30/hour, desaturation to 83%, body weight 310 pounds MSLT 12/01/03-mean latency 3.0   SOREM 0/4 Echo 09/04/17-  - Normal LV systolic function; mild LVH and LVE; mild diastolic dysfunction; trace AI; mildly dilated aortic root; mild MR; mild TR; moderately elevated pulmonary pressure.   -------------------------------------------------------------------------------------------------  09/25/17-72 year old male never smoker followed for OSA, complicated by pulmonary embolism 2014, allergic rhinitis, arthritis, BPH, peripheral venous insufficiency, GERD, HBP, CHF/CAD -------OSA; DME [facility]. Pt wears CPAP everyight;however he did not bring his machine or SD card for DL. Pt would like to have order placed for nasal pillow mask Changed from [place] to [facility] at last visit, but never contacted by [facility]. , still using old [facility] 09/04/17-acute CHF, NonSTEMI Breathing feels stable.  He is comfortable with CPAP and sleeps better with it but would like to change to nasal pillows mask and complete transfer at his request from [place] to [facility].  Machine is old and we can take the opportunity to change to AutoPap.  No break in therapy.  12/21/2017- 72 year old male never smoker followed for OSA, complicated by pulmonary embolism 2014, allergic rhinitis, arthritis, BPH, peripheral venous insufficiency, GERD, HBP, CHF/CAD   / [facility] CPAP ----Pt states he is sleeping all the time, which is concerning to him. Pt is wearing CPAP every night. DME: [facility] Download 63% compliance AHI 5.1/hour.  Puts it on almost every night but goes to bed late.  Estimates bedtime 1 AM up once before finally up around 10 AM.  Some daytime tiredness which is been worse since he had an MI at  Christmas.  Coffee 3 or 4 cups daily.  Not aware of snoring through CPAP.  ROS-see HPI   + = Positive Constitutional:    weight loss, night sweats, fevers, chills, +fatigue, lassitude. HEENT:    headaches, difficulty swallowing, tooth/dental problems, sore throat,       sneezing, itching, ear ache, nasal congestion, post nasal drip, snoring CV:    chest pain, orthopnea, PND, swelling in lower extremities, anasarca,                                                       dizziness, palpitations Resp:   shortness of breath with exertion or at rest.                productive cough,   non-productive cough, coughing up of blood.              change in color of mucus.  wheezing.   Skin:    rash or lesions. GI:  No-   heartburn, indigestion, abdominal pain, nausea, vomiting, diarrhea,                 change in bowel habits, loss of appetite GU: dysuria, change in color of urine, no urgency or frequency.   flank pain. MS:   joint pain, stiffness, decreased range of motion, back pain. Neuro-     nothing unusual Psych:  change in mood or affect.  depression or anxiety.   memory loss.  OBJ- Physical Exam General- Alert, Oriented, Affect-appropriate, Distress- none acute Skin- rash-none, lesions- none, excoriation- none Lymphadenopathy- none  Head- atraumatic            Eyes- Gross vision intact, PERRLA, conjunctivae and secretions clear            Ears- Hearing, canals-normal            Nose- Clear, no-Septal dev, mucus, polyps, erosion, perforation             Throat- Mallampati II-III , mucosa clear , drainage- none, tonsils- atrophic,  Neck- flexible , trachea midline, no stridor , thyroid nl, carotid no bruit Chest - symmetrical excursion , unlabored           Heart/CV- RRR , no murmur , no gallop  , no rub, nl s1 s2                           - JVD- none , edema- none, stasis changes- none, varices- none           Lung- clear to P&A, wheeze- none, cough- none , dullness-none, rub- none            Chest wall-  Abd-  Br/ Gen/ Rectal- Not done, not indicated Extrem- cyanosis- none, clubbing, none, atrophy- none, strength- nl Neuro- grossly intact to observation

## 2017-12-21 NOTE — Progress Notes (Signed)
Cardiac Individual Treatment Plan  Patient Details  Name: John Blackburn MRN: [id number] Date of Birth: Jan 09, 1946 Referring Provider:     CARDIAC REHAB PHASE II ORIENTATION from 10/03/2017 in [place]  Referring Provider  Dr Lorie Phenix       Initial Encounter Date:    CARDIAC REHAB PHASE II ORIENTATION from 10/03/2017 in [place]  Date  10/03/17  Referring Provider  Dr Lorie Phenix       Visit Diagnosis: Status post coronary artery stent placement 09/01/17 S/P DES Mid RCA  NSTEMI (non-ST elevated myocardial infarction) (HCC)08/30/17  Patient's Home Medications on Admission:  Current Outpatient Medications:  .  aspirin EC 81 MG EC tablet, Take 1 tablet (81 mg total) by mouth daily., Disp: , Rfl:  .  atenolol-chlorthalidone (TENORETIC) 50-25 MG tablet, TAKE ONE TABLET BY MOUTH ONCE DAILY, Disp: 90 tablet, Rfl: 3 .  atorvastatin (LIPITOR) 80 MG tablet, Take 1 tablet (80 mg total) by mouth daily at 6 PM., Disp: 90 tablet, Rfl: 1 .  buPROPion (WELLBUTRIN XL) 300 MG 24 hr tablet, TAKE 1 TABLET BY MOUTH ONCE DAILY, Disp: 30 tablet, Rfl: 2 .  clopidogrel (PLAVIX) 75 MG tablet, Take 1 tablet (75 mg total) by mouth daily., Disp: 90 tablet, Rfl: 2 .  furosemide (LASIX) 40 MG tablet, TAKE ONE TABLET BY MOUTH ONCE DAILY, Disp: 30 tablet, Rfl: 3 .  loratadine (CLARITIN) 10 MG tablet, Take 10 mg by mouth daily., Disp: , Rfl:  .  Melatonin 1 MG/ML LIQD, Take 2.5 mg by mouth at bedtime. , Disp: , Rfl:  .  nitroGLYCERIN (NITROSTAT) 0.4 MG SL tablet, Place 1 tablet (0.4 mg total) under the tongue every 5 (five) minutes as needed. (Patient not taking: Reported on 12/21/2017), Disp: 25 tablet, Rfl: 2 .  Oxymetazoline HCl (VICKS SINEX MOISTURIZING NA), Place 1 spray into the nose daily as needed (congestion)., Disp: , Rfl:  .  pantoprazole (PROTONIX) 40 MG tablet, Take 1 tablet (40 mg total) by mouth daily., Disp: 30 tablet,  Rfl: 1 .  potassium chloride SA (K-DUR,KLOR-CON) 20 MEQ tablet, Take 2 tablets (40 mEq total) by mouth daily., Disp: 60 tablet, Rfl: 3 .  sertraline (ZOLOFT) 100 MG tablet, Take 1 tablet (100 mg total) by mouth daily., Disp: 30 tablet, Rfl: 3 .  tetrahydrozoline 0.05 % ophthalmic solution, Place 1 drop into both eyes daily as needed (dry eyes)., Disp: , Rfl:  .  Turmeric 500 MG CAPS, Take by mouth., Disp: , Rfl:  .  Vitamin D, Ergocalciferol, (DRISDOL) 50000 units CAPS capsule, Take 1 capsule (50,000 Units total) by mouth every 7 (seven) days., Disp: 12 capsule, Rfl: 0  Past Medical History: Past Medical History:  Diagnosis Date  . Allergy   . 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 CAD (coronary artery disease)    08/2017 PCI/DES mRCA, RPDA, CTO pf pLCX with collaterals, normal EF  . Depression    on multiple meds. Has been seen at [facility]. and Dr. Sabra Heck is his prescriber  . Diverticulitis of colon 2000 's   treated as an outpatient  . GERD (gastroesophageal reflux disease)    UGI done August '12 - ulcer/. Dr. Ferdinand Lango, gastroenterologist in [facility].   [name] Hx of adenomatous colonic polyps   . Hyperlipidemia    last lipid panel: HDL 44, LDL 117  . Hypertension   . Peptic ulcer    in the  past and just recently-August '12  . Pulmonary emboli [facility])    noted October 2014 - treated by Dr. Linda Hedges  . skin cancer    skin CA  . Sleep apnea, primary central    wears CPAP    Tobacco Use: Social History   Tobacco Use  Smoking Status Never Smoker  Smokeless Tobacco Never Used    Labs: Recent Review Flowsheet Data    Labs for ITP Cardiac and Pulmonary Rehab Latest Ref Rng & Units 08/30/2017 09/01/2017 09/01/2017 09/01/2017 11/27/2017   Cholestrol 0 - 200 mg/dL - 166 - - -   LDLCALC 0 - 99 mg/dL - 114(H) - - -   LDLDIRECT mg/dL - - - - -   HDL >40 mg/dL - 30(L) - - -   Trlycerides <150 mg/dL - 109 - - -   Hemoglobin A1c 4.6 - 6.5 % - - - - 6.3   PHART 7.350 -  7.450 - - 7.392 - -   PCO2ART 32.0 - 48.0 mmHg - - 41.5 - -   HCO3 20.0 - 28.0 mmol/L - - 25.2 26.1 -   TCO2 22 - 32 mmol/L 28 - 26 27 -   O2SAT % - - 97.0 71.0 -      Capillary Blood Glucose: No results found for: GLUCAP   Exercise Target Goals:    Exercise Program Goal: Individual exercise prescription set using results from initial 6 min walk test and THRR while considering  patient's activity barriers and safety.   Exercise Prescription Goal: Initial exercise prescription builds to 30-45 minutes a day of aerobic activity, 2-3 days per week.  Home exercise guidelines will be given to patient during program as part of exercise prescription that the participant will acknowledge.  Activity Barriers & Risk Stratification: Activity Barriers & Cardiac Risk Stratification - 10/03/17 1457      Activity Barriers & Cardiac Risk Stratification   Activity Barriers  Right Knee Replacement;Left Knee Replacement;Arthritis;Muscular Weakness;Balance Concerns;Assistive Device;History of Falls       6 Minute Walk: 6 Minute Walk    Row Name 10/03/17 1441 10/03/17 1457       6 Minute Walk   Phase  Initial  -    Distance  897 feet  -    Walk Time  6 minutes  -    # of Rest Breaks  0  -    MPH  1.7  -    METS  1.56  -    RPE  13  -    VO2 Peak  5.5  -    Symptoms  No  -    Resting HR  56 bpm  -    Resting BP  104/56  -    Resting Oxygen Saturation   96 %  -    Exercise Oxygen Saturation  during 6 min walk  94 % encouraged PLB, O2 increased to 96%  -    Max Ex. HR  66 bpm  -    Max Ex. BP  124/74  -    2 Minute Post BP  -  104/60       Oxygen Initial Assessment:   Oxygen Re-Evaluation:   Oxygen Discharge (Final Oxygen Re-Evaluation):   Initial Exercise Prescription: Initial Exercise Prescription - 10/03/17 1500      Date of Initial Exercise RX and Referring Provider   Date  10/03/17    Referring Provider  Dr Lorie Phenix       NuStep  Level  1    SPM  50     Minutes  10    METs  1      Arm Ergometer   Level  1    RPM  10    Minutes  10    METs  1      Track   Laps  6    Minutes  10    METs  2.03      Prescription Details   Frequency (times per week)  3    Duration  Progress to 30 minutes of continuous aerobic without signs/symptoms of physical distress      Intensity   THRR 40-80% of Max Heartrate  60-119    Ratings of Perceived Exertion  11-15    Perceived Dyspnea  0-4      Progression   Progression  Continue to progress workloads to maintain intensity without signs/symptoms of physical distress.      Resistance Training   Training Prescription  Yes    Weight  1lb    Reps  10-15       Perform Capillary Blood Glucose checks as needed.  Exercise Prescription Changes: Exercise Prescription Changes    Row Name 10/25/17 1502 11/17/17 1504 11/27/17 1446 12/11/17 1448       Response to Exercise   Blood Pressure (Admit)  109/72  100/60  118/62  100/60    Blood Pressure (Exercise)  100/72  116/60  118/66  120/60    Blood Pressure (Exit)  118/60  100/62  108/70  100/74    Heart Rate (Admit)  55 bpm  62 bpm  69 bpm  76 bpm    Heart Rate (Exercise)  94 bpm  75 bpm  74 bpm  85 bpm    Heart Rate (Exit)  55 bpm  60 bpm  63 bpm  63 bpm    Rating of Perceived Exertion (Exercise)  13  11  12  11     Symptoms  none  none  none  none    Duration  Progress to 30 minutes of  aerobic without signs/symptoms of physical distress  Progress to 30 minutes of  aerobic without signs/symptoms of physical distress  Progress to 30 minutes of  aerobic without signs/symptoms of physical distress  Progress to 30 minutes of  aerobic without signs/symptoms of physical distress    Intensity  THRR unchanged  THRR unchanged  THRR unchanged  THRR unchanged      Progression   Progression  Continue to progress workloads to maintain intensity without signs/symptoms of physical distress.  Continue to progress workloads to maintain intensity without signs/symptoms  of physical distress.  Continue to progress workloads to maintain intensity without signs/symptoms of physical distress.  Continue to progress workloads to maintain intensity without signs/symptoms of physical distress.    Average METs  1.6  2.1  2.4  2.8      Resistance Training   Training Prescription  No Relaxation today, no weights.  Yes  Yes  Yes    Weight  -  2lbs  2lbs  1lb    Reps  -  10-15  10-15  10-15    Time  -  10 Minutes  10 Minutes  10 Minutes      Interval Training   Interval Training  No  No  No  No      NuStep   Level  2  2  2   -    SPM  68  68  68  -    Minutes  10  10  10   -    METs  1.6  1.6  1.7  -      Arm Ergometer   Level  1  1 25  watts  1 41 watts  1 40 watts    Minutes  10  10  10  10     METs  1.5  2.25  2.96  2.96      Track   Laps  -  9  9  10     Minutes  10  10  10  10     METs  -  2.57  2.57  2.74      Home Exercise Plan   Plans to continue exercise at  -  -  -  Home (comment)    Frequency  -  -  -  Add 2 additional days to program exercise sessions.       Exercise Comments: Exercise Comments    Row Name 10/25/17 1632 11/21/17 1539 11/27/17 1446 12/11/17 1448     Exercise Comments  Off to a good start with exercise.  Making slow progress with exercise. Increase workloads as tolerated.  Reviewed METs with patient.  Reviewed METs and goals with patient.       Exercise Goals and Review: Exercise Goals    Row Name 10/03/17 1357             Exercise Goals   Increase Physical Activity  Yes       Intervention  Provide advice, education, support and counseling about physical activity/exercise needs.;Develop an individualized exercise prescription for aerobic and resistive training based on initial evaluation findings, risk stratification, comorbidities and participant's personal goals.       Expected Outcomes  Short Term: Attend rehab on a regular basis to increase amount of physical activity.;Long Term: Exercising regularly at least 3-5 days  a week.;Long Term: Add in home exercise to make exercise part of routine and to increase amount of physical activity.       Increase Strength and Stamina  Yes be able to walk 1 mile       Intervention  Provide advice, education, support and counseling about physical activity/exercise needs.;Develop an individualized exercise prescription for aerobic and resistive training based on initial evaluation findings, risk stratification, comorbidities and participant's personal goals.       Expected Outcomes  Short Term: Increase workloads from initial exercise prescription for resistance, speed, and METs.;Short Term: Perform resistance training exercises routinely during rehab and add in resistance training at home;Long Term: Improve cardiorespiratory fitness, muscular endurance and strength as measured by increased METs and functional capacity (6MWT)       Able to understand and use rate of perceived exertion (RPE) scale  Yes       Intervention  Provide education and explanation on how to use RPE scale       Expected Outcomes  Short Term: Able to use RPE daily in rehab to express subjective intensity level;Long Term:  Able to use RPE to guide intensity level when exercising independently       Knowledge and understanding of Target Heart Rate Range (THRR)  Yes       Intervention  Provide education and explanation of THRR including how the numbers were predicted and where they are located for reference       Expected Outcomes  Short Term: Able to state/look up THRR;Long Term: Able to use  THRR to govern intensity when exercising independently;Short Term: Able to use daily as guideline for intensity in rehab       Able to check pulse independently  Yes       Intervention  Provide education and demonstration on how to check pulse in carotid and radial arteries.;Review the importance of being able to check your own pulse for safety during independent exercise       Expected Outcomes  Short Term: Able to explain why  pulse checking is important during independent exercise;Long Term: Able to check pulse independently and accurately       Understanding of Exercise Prescription  Yes       Intervention  Provide education, explanation, and written materials on patient's individual exercise prescription       Expected Outcomes  Short Term: Able to explain program exercise prescription;Long Term: Able to explain home exercise prescription to exercise independently          Exercise Goals Re-Evaluation : Exercise Goals Re-Evaluation    [place] Name 10/25/17 1631 11/21/17 1538 12/11/17 1448         Exercise Goal Re-Evaluation   Exercise Goals Review  Increase Physical Activity;Able to understand and use rate of perceived exertion (RPE) scale  Increase Physical Activity  Increase Physical Activity     Comments  Patient tolerating low intensity exercise without c/o. Patient understands and is able to use the RPE scale appropriately.  Patient continues to make slow progress with exercise.  Patient states that he walks at the park 30 minutes 2 days/week. Pt states he does not use an assistive device when he walks at the park, but I encouraged him to do so, since he has balance/gait issues, and has fallen at the park previously.      Expected Outcomes  Increase workloads as tolerated to help achieve health and fitness goals.  Continue increasing workloads as tolerated.  Patient will walk 30 minutes as tolerated on a level surface 2 days/week in addition to exercise at [facility].         Discharge Exercise Prescription (Final Exercise Prescription Changes): Exercise Prescription Changes - 12/11/17 1448      Response to Exercise   Blood Pressure (Admit)  100/60    Blood Pressure (Exercise)  120/60    Blood Pressure (Exit)  100/74    Heart Rate (Admit)  76 bpm    Heart Rate (Exercise)  85 bpm    Heart Rate (Exit)  63 bpm    Rating of Perceived Exertion (Exercise)  11    Symptoms  none    Duration  Progress to 30 minutes of   aerobic without signs/symptoms of physical distress    Intensity  THRR unchanged      Progression   Progression  Continue to progress workloads to maintain intensity without signs/symptoms of physical distress.    Average METs  2.8      Resistance Training   Training Prescription  Yes    Weight  1lb    Reps  10-15    Time  10 Minutes      Interval Training   Interval Training  No      Arm Ergometer   Level  1 40 watts    Minutes  10    METs  2.96      Track   Laps  10    Minutes  10    METs  2.74      Home Exercise Plan  Plans to continue exercise at  Home (comment)    Frequency  Add 2 additional days to program exercise sessions.       Nutrition:  Target Goals: Understanding of nutrition guidelines, daily intake of sodium 1500mg , cholesterol 200mg , calories 30% from fat and 7% or less from saturated fats, daily to have 5 or more servings of fruits and vegetables.  Biometrics: Pre Biometrics - 10/03/17 1442      Pre Biometrics   Height  6' (1.829 m)    Weight  235 lb 7.2 oz (106.8 kg)    Waist Circumference  42.5 inches    Hip Circumference  44.5 inches    Waist to Hip Ratio  0.96 %    BMI (Calculated)  31.93    Triceps Skinfold  18 mm    % Body Fat  30.7 %    Grip Strength  27 kg    Flexibility  9 in    Single Leg Stand  1 seconds        Nutrition Therapy Plan and Nutrition Goals: Nutrition Therapy & Goals - 10/04/17 1157      Nutrition Therapy   Diet  Heart Healthy      Intervention Plan   Intervention  Prescribe, educate and counsel regarding individualized specific dietary modifications aiming towards targeted core components such as weight, hypertension, lipid management, diabetes, heart failure and other comorbidities.    Expected Outcomes  Short Term Goal: Understand basic principles of dietary content, such as calories, fat, sodium, cholesterol and nutrients.;Long Term Goal: Adherence to prescribed nutrition plan.       Nutrition  Assessments: Nutrition Assessments - 11/06/17 1545      MEDFICTS Scores   Pre Score  48       Nutrition Goals Re-Evaluation:   Nutrition Goals Re-Evaluation:   Nutrition Goals Discharge (Final Nutrition Goals Re-Evaluation):   Psychosocial: Target Goals: Acknowledge presence or absence of significant depression and/or stress, maximize coping skills, provide positive support system. Participant is able to verbalize types and ability to use techniques and skills needed for reducing stress and depression.  Initial Review & Psychosocial Screening: Initial Psych Review & Screening - 10/03/17 1545      Initial Review   Current issues with  History of Depression      Family Dynamics   Good Support System?  Yes    Concerns  -- John Blackburn says he went through a bad divorce 14 years ago      Barriers   Psychosocial barriers to participate in program  The patient should benefit from training in stress management and relaxation.      Screening Interventions   Interventions  Encouraged to exercise;To provide support and resources with identified psychosocial needs    Expected Outcomes  Long Term Goal: Stressors or current issues are controlled or eliminated.;Short Term goal: Identification and review with participant of any Quality of Life or Depression concerns found by scoring the questionnaire.;Long Term goal: The participant improves quality of Life and PHQ9 Scores as seen by post scores and/or verbalization of changes       Quality of Life Scores: Quality of Life - 10/24/17 1419      Quality of Life Scores   Health/Function Pre  19.23 %    Socioeconomic Pre  18.21 %    Psych/Spiritual Pre  25 %    Family Pre  19.38 %    GLOBAL Pre  20.26 %      Scores of 19 and below  usually indicate a poorer quality of life in these areas.  A difference of  2-3 points is a clinically meaningful difference.  A difference of 2-3 points in the total score of the Quality of Life Index has been  associated with significant improvement in overall quality of life, self-image, physical symptoms, and general health in studies assessing change in quality of life.  PHQ-9: Recent Review Flowsheet Data    Depression screen [facility] 2/9 10/23/2017 10/17/2017 10/03/2017 10/03/2017 09/13/2017   Decreased Interest 1 1 (No Data)  0 0   Down, Depressed, Hopeless 3 3 - 0 0   PHQ - 2 Score 4 4 - 0 0   Altered sleeping 0 0 - - -   Tired, decreased energy 1 1 - - -   Change in appetite 0 0 - - -   Feeling bad or failure about yourself  3 3 - - -   Trouble concentrating 0 0 - - -   Moving slowly or fidgety/restless 0 0 - - -   Suicidal thoughts 1 1 - - -   PHQ-9 Score 9 9 - - -   Difficult doing work/chores Somewhat difficult Somewhat difficult - - -     Interpretation of Total Score  Total Score Depression Severity:  1-4 = Minimal depression, 5-9 = Mild depression, 10-14 = Moderate depression, 15-19 = Moderately severe depression, 20-27 = Severe depression   Psychosocial Evaluation and Intervention:   Psychosocial Re-Evaluation: Psychosocial Re-Evaluation    Row Name 10/26/17 1522 11/23/17 1528 12/21/17 1144         Psychosocial Re-Evaluation   Current issues with  History of Depression  History of Depression  History of Depression     Comments  will review QOL scores with the patient this week  John Blackburn has an appointment to discuss some recent problems with his memory with Dr Sharlet Salina on 11/27/17  John Blackburn has denied being depressed during exercise at cardiac rehab. Will continue to monitor     Interventions  Stress management education  Encouraged to attend Pulmonary Rehabilitation for the exercise;Encouraged to attend Cardiac Rehabilitation for the exercise  Encouraged to attend Cardiac Rehabilitation for the exercise;Stress management education     Continue Psychosocial Services   Follow up required by staff  Follow up required by staff  Follow up required by staff        Psychosocial Discharge  (Final Psychosocial Re-Evaluation): Psychosocial Re-Evaluation - 12/21/17 1144      Psychosocial Re-Evaluation   Current issues with  History of Depression    Comments  John Blackburn has denied being depressed during exercise at cardiac rehab. Will continue to monitor    Interventions  Encouraged to attend Cardiac Rehabilitation for the exercise;Stress management education    Continue Psychosocial Services   Follow up required by staff       Vocational Rehabilitation: Provide vocational rehab assistance to qualifying candidates.   Vocational Rehab Evaluation & Intervention: Vocational Rehab - 10/03/17 1543      Initial Vocational Rehab Evaluation & Intervention   Assessment shows need for Vocational Rehabilitation  Yes John Blackburn is retired but says he is interested in vocational rehab for job retraining     Vocational Rehab Packet given to patient  10/03/17       Education: Education Goals: Education classes will be provided on a weekly basis, covering required topics. Participant will state understanding/return demonstration of topics presented.  Learning Barriers/Preferences: Learning Barriers/Preferences - 10/03/17 1459      Learning  Barriers/Preferences   Learning Barriers  Hearing;Sight       Education Topics: Count Your Pulse:  -Group instruction provided by verbal instruction, demonstration, patient participation and written materials to support subject.  Instructors address importance of being able to find your pulse and how to count your pulse when at home without a heart monitor.  Patients get hands on experience counting their pulse with staff help and individually.   CARDIAC REHAB PHASE II EXERCISE from 12/20/2017 in [place]  Date  12/15/17  Instruction Review Code  2- Demonstrated Understanding      Heart Attack, Angina, and Risk Factor Modification:  -Group instruction provided by verbal instruction, video, and written materials to support  subject.  Instructors address signs and symptoms of angina and heart attacks.    Also discuss risk factors for heart disease and how to make changes to improve heart health risk factors.   Functional Fitness:  -Group instruction provided by verbal instruction, demonstration, patient participation, and written materials to support subject.  Instructors address safety measures for doing things around the house.  Discuss how to get up and down off the floor, how to pick things up properly, how to safely get out of a chair without assistance, and balance training.   CARDIAC REHAB PHASE II EXERCISE from 12/20/2017 in [place]  Date  11/17/17  Instruction Review Code  2- Demonstrated Understanding      Meditation and Mindfulness:  -Group instruction provided by verbal instruction, patient participation, and written materials to support subject.  Instructor addresses importance of mindfulness and meditation practice to help reduce stress and improve awareness.  Instructor also leads participants through a meditation exercise.    Stretching for Flexibility and Mobility:  -Group instruction provided by verbal instruction, patient participation, and written materials to support subject.  Instructors lead participants through series of stretches that are designed to increase flexibility thus improving mobility.  These stretches are additional exercise for major muscle groups that are typically performed during regular warm up and cool down.   Hands Only CPR:  -Group verbal, video, and participation provides a basic overview of AHA guidelines for community CPR. Role-play of emergencies allow participants the opportunity to practice calling for help and chest compression technique with discussion of AED use.   Hypertension: -Group verbal and written instruction that provides a basic overview of hypertension including the most recent diagnostic guidelines, risk factor  reduction with self-care instructions and medication management.    Nutrition I class: Heart Healthy Eating:  -Group instruction provided by PowerPoint slides, verbal discussion, and written materials to support subject matter. The instructor gives an explanation and review of the Therapeutic Lifestyle Changes diet recommendations, which includes a discussion on lipid goals, dietary fat, sodium, fiber, plant stanol/sterol esters, sugar, and the components of a well-balanced, healthy diet.   Nutrition II class: Lifestyle Skills:  -Group instruction provided by PowerPoint slides, verbal discussion, and written materials to support subject matter. The instructor gives an explanation and review of label reading, grocery shopping for heart health, heart healthy recipe modifications, and ways to make healthier choices when eating out.   Diabetes Question & Answer:  -Group instruction provided by PowerPoint slides, verbal discussion, and written materials to support subject matter. The instructor gives an explanation and review of diabetes co-morbidities, pre- and post-prandial blood glucose goals, pre-exercise blood glucose goals, signs, symptoms, and treatment of hypoglycemia and hyperglycemia, and foot care basics.   CARDIAC  REHAB PHASE II EXERCISE from 12/20/2017 in [place]  Date  12/08/17  Educator  RD  Instruction Review Code  2- Demonstrated Understanding      Diabetes Blitz:  -Group instruction provided by PowerPoint slides, verbal discussion, and written materials to support subject matter. The instructor gives an explanation and review of the physiology behind type 1 and type 2 diabetes, diabetes medications and rational behind using different medications, pre- and post-prandial blood glucose recommendations and Hemoglobin A1c goals, diabetes diet, and exercise including blood glucose guidelines for exercising safely.    Portion Distortion:  -Group  instruction provided by PowerPoint slides, verbal discussion, written materials, and food models to support subject matter. The instructor gives an explanation of serving size versus portion size, changes in portions sizes over the last 20 years, and what consists of a serving from each food group.   CARDIAC REHAB PHASE II EXERCISE from 12/20/2017 in [place]  Date  11/29/17  Educator  RD  Instruction Review Code  2- Demonstrated Understanding      Stress Management:  -Group instruction provided by verbal instruction, video, and written materials to support subject matter.  Instructors review role of stress in heart disease and how to cope with stress positively.     Exercising on Your Own:  -Group instruction provided by verbal instruction, power point, and written materials to support subject.  Instructors discuss benefits of exercise, components of exercise, frequency and intensity of exercise, and end points for exercise.  Also discuss use of nitroglycerin and activating EMS.  Review options of places to exercise outside of rehab.  Review guidelines for sex with heart disease.   Cardiac Drugs I:  -Group instruction provided by verbal instruction and written materials to support subject.  Instructor reviews cardiac drug classes: antiplatelets, anticoagulants, beta blockers, and statins.  Instructor discusses reasons, side effects, and lifestyle considerations for each drug class.   CARDIAC REHAB PHASE II EXERCISE from 12/20/2017 in [place]  Date  11/15/17  Educator  -- [pharmacy]  Instruction Review Code  2- Demonstrated Understanding      Cardiac Drugs II:  -Group instruction provided by verbal instruction and written materials to support subject.  Instructor reviews cardiac drug classes: angiotensin converting enzyme inhibitors (ACE-I), angiotensin II receptor blockers (ARBs), nitrates, and calcium channel blockers.   Instructor discusses reasons, side effects, and lifestyle considerations for each drug class.   Anatomy and Physiology of the Circulatory System:  Group verbal and written instruction and models provide basic cardiac anatomy and physiology, with the coronary electrical and arterial systems. Review of: AMI, Angina, Valve disease, Heart Failure, Peripheral Artery Disease, Cardiac Arrhythmia, Pacemakers, and the ICD.   CARDIAC REHAB PHASE II EXERCISE from 12/20/2017 in [place]  Date  12/20/17  Instruction Review Code  2- Demonstrated Understanding      Other Education:  -Group or individual verbal, written, or video instructions that support the educational goals of the cardiac rehab program.   Holiday Eating Survival Tips:  -Group instruction provided by PowerPoint slides, verbal discussion, and written materials to support subject matter. The instructor gives patients tips, tricks, and techniques to help them not only survive but enjoy the holidays despite the onslaught of food that accompanies the holidays.   Knowledge Questionnaire Score: Knowledge Questionnaire Score - 10/24/17 1420      Knowledge Questionnaire Score   Pre Score  21/24  Core Components/Risk Factors/Patient Goals at Admission: Personal Goals and Risk Factors at Admission - 10/03/17 1457      Core Components/Risk Factors/Patient Goals on Admission    Weight Management  Yes;Obesity;Weight Maintenance    Intervention  Weight Management: Develop a combined nutrition and exercise program designed to reach desired caloric intake, while maintaining appropriate intake of nutrient and fiber, sodium and fats, and appropriate energy expenditure required for the weight goal.;Weight Management/Obesity: Establish reasonable short term and long term weight goals.;Obesity: Provide education and appropriate resources to help participant work on and attain dietary goals.;Weight Management:  Provide education and appropriate resources to help participant work on and attain dietary goals.    Admit Weight  235 lb 7.2 oz (106.8 kg)    Goal Weight: Short Term  230 lb (104.3 kg)    Goal Weight: Long Term  225 lb (102.1 kg)    Expected Outcomes  Short Term: Continue to assess and modify interventions until short term weight is achieved;Long Term: Adherence to nutrition and physical activity/exercise program aimed toward attainment of established weight goal;Weight Loss: Understanding of general recommendations for a balanced deficit meal plan, which promotes 1-2 lb weight loss per week and includes a negative energy balance of [phone] kcal/d;Weight Maintenance: Understanding of the daily nutrition guidelines, which includes 25-35% calories from fat, 7% or less cal from saturated fats, less than 200mg  cholesterol, less than 1.5gm of sodium, & 5 or more servings of fruits and vegetables daily;Understanding recommendations for meals to include 15-35% energy as protein, 25-35% energy from fat, 35-60% energy from carbohydrates, less than 200mg  of dietary cholesterol, 20-35 gm of total fiber daily;Understanding of distribution of calorie intake throughout the day with the consumption of 4-5 meals/snacks    Hypertension  Yes    Intervention  Provide education on lifestyle modifcations including regular physical activity/exercise, weight management, moderate sodium restriction and increased consumption of fresh fruit, vegetables, and low fat dairy, alcohol moderation, and smoking cessation.;Monitor prescription use compliance.    Expected Outcomes  Short Term: Continued assessment and intervention until BP is < 140/41mm HG in hypertensive participants. < 130/60mm HG in hypertensive participants with diabetes, heart failure or chronic kidney disease.;Long Term: Maintenance of blood pressure at goal levels.       Core Components/Risk Factors/Patient Goals Review:  Goals and Risk Factor Review    Row Name  10/26/17 1516 11/23/17 1523 12/21/17 1142         Core Components/Risk Factors/Patient Goals Review   Personal Goals Review  Weight Management/Obesity;Hypertension  Weight Management/Obesity;Hypertension  Weight Management/Obesity;Hypertension     Review  John Blackburn's vital signs have been stable at cardiac rehab so far  John Blackburn's vital signs have been stable at cardiac rehab so far  John Blackburn's vital signs have been stable at cardiac rehab. John Blackburn is enjoying partcipating in phase 2 cardiac rehab. John Blackburn uses a rolling walker for stability     Expected Outcomes  John Blackburn will continue to participate in phase 2 cardiac rehab and take his medcations as presribed  John Blackburn will continue to participate in phase 2 cardiac rehab and take his medcations as presribed  John Blackburn will continue to participate in phase 2 cardiac rehab and take his medcations as presribed        Core Components/Risk Factors/Patient Goals at Discharge (Final Review):  Goals and Risk Factor Review - 12/21/17 1142      Core Components/Risk Factors/Patient Goals Review   Personal Goals Review  Weight Management/Obesity;Hypertension    Review  John Blackburn's vital  signs have been stable at cardiac rehab. John Blackburn is enjoying partcipating in phase 2 cardiac rehab. John Blackburn uses a rolling walker for stability    Expected Outcomes  John Blackburn will continue to participate in phase 2 cardiac rehab and take his medcations as presribed       ITP Comments: ITP Comments    Row Name 10/03/17 1346 10/26/17 1513 11/23/17 1520 12/21/17 1142     ITP Comments  Dr. Fransico Him, Medical Director  30 day ITP Review. Patient is off to a good start to exercise.  30 day ITP Review. John Blackburn has good partcipation and fair attendance in phase 2 cardiac rehab  30 day ITP Review. John Blackburn has good partcipation and fair attendance in phase 2 cardiac rehab       Comments: See ITP comments.Barnet Pall, RN,BSN 12/21/2017 11:49 AM

## 2017-12-21 NOTE — Patient Instructions (Signed)
Try to use your CPAP all night, every night, and anytime you sleep. See if that helps with the sleepiness issue.  Ok to drink coffee during the day, but not so late it keeps you awake at night.  Please call if we can help

## 2017-12-22 ENCOUNTER — Encounter (HOSPITAL_COMMUNITY): Payer: PPO

## 2017-12-26 DIAGNOSIS — I119 Hypertensive heart disease without heart failure: Secondary | ICD-10-CM | POA: Diagnosis not present

## 2017-12-26 DIAGNOSIS — I872 Venous insufficiency (chronic) (peripheral): Secondary | ICD-10-CM | POA: Diagnosis not present

## 2017-12-26 DIAGNOSIS — E785 Hyperlipidemia, unspecified: Secondary | ICD-10-CM | POA: Diagnosis not present

## 2017-12-26 DIAGNOSIS — Z8711 Personal history of peptic ulcer disease: Secondary | ICD-10-CM | POA: Diagnosis not present

## 2017-12-26 DIAGNOSIS — I2510 Atherosclerotic heart disease of native coronary artery without angina pectoris: Secondary | ICD-10-CM | POA: Diagnosis not present

## 2017-12-26 DIAGNOSIS — Z8249 Family history of ischemic heart disease and other diseases of the circulatory system: Secondary | ICD-10-CM | POA: Diagnosis not present

## 2017-12-26 DIAGNOSIS — Z955 Presence of coronary angioplasty implant and graft: Secondary | ICD-10-CM | POA: Diagnosis not present

## 2017-12-26 DIAGNOSIS — E668 Other obesity: Secondary | ICD-10-CM | POA: Diagnosis not present

## 2017-12-26 DIAGNOSIS — Z86711 Personal history of pulmonary embolism: Secondary | ICD-10-CM | POA: Diagnosis not present

## 2017-12-26 DIAGNOSIS — G4733 Obstructive sleep apnea (adult) (pediatric): Secondary | ICD-10-CM | POA: Diagnosis not present

## 2017-12-26 NOTE — Assessment & Plan Note (Signed)
He is being followed by cardiology.  Denies chest pain or palpitations recently.

## 2017-12-26 NOTE — Assessment & Plan Note (Signed)
We reviewed his CPAP download and explained compliance needs.  He is not wearing it as much as he needs to to deal with the residual daytime tiredness.  Not sure if any of his cardiac meds may contribute to that feeling. Plan-continue CPAP 5-15

## 2017-12-27 ENCOUNTER — Encounter (HOSPITAL_COMMUNITY)
Admission: RE | Admit: 2017-12-27 | Discharge: 2017-12-27 | Disposition: A | Discharge status: Home / Self Care | Payer: PPO | Source: Ambulatory Visit | Attending: Cardiovascular Disease | Admitting: Cardiovascular Disease

## 2017-12-27 ENCOUNTER — Encounter (HOSPITAL_COMMUNITY): Payer: PPO

## 2017-12-27 DIAGNOSIS — Z955 Presence of coronary angioplasty implant and graft: Secondary | ICD-10-CM

## 2017-12-27 DIAGNOSIS — I214 Non-ST elevation (NSTEMI) myocardial infarction: Secondary | ICD-10-CM | POA: Diagnosis not present

## 2017-12-28 ENCOUNTER — Ambulatory Visit: Payer: PPO | Admitting: Internal Medicine

## 2017-12-29 ENCOUNTER — Encounter (HOSPITAL_COMMUNITY)
Admission: RE | Admit: 2017-12-29 | Discharge: 2017-12-29 | Disposition: A | Discharge status: Home / Self Care | Payer: PPO | Source: Ambulatory Visit | Attending: Cardiovascular Disease | Admitting: Cardiovascular Disease

## 2017-12-29 ENCOUNTER — Encounter (HOSPITAL_COMMUNITY): Payer: PPO

## 2017-12-29 DIAGNOSIS — I214 Non-ST elevation (NSTEMI) myocardial infarction: Secondary | ICD-10-CM | POA: Diagnosis not present

## 2017-12-29 DIAGNOSIS — Z955 Presence of coronary angioplasty implant and graft: Secondary | ICD-10-CM

## 2018-01-01 ENCOUNTER — Encounter (HOSPITAL_COMMUNITY)
Admission: RE | Admit: 2018-01-01 | Discharge: 2018-01-01 | Disposition: A | Discharge status: Home / Self Care | Payer: PPO | Source: Ambulatory Visit | Attending: Cardiovascular Disease | Admitting: Cardiovascular Disease

## 2018-01-01 DIAGNOSIS — I214 Non-ST elevation (NSTEMI) myocardial infarction: Secondary | ICD-10-CM | POA: Diagnosis not present

## 2018-01-01 DIAGNOSIS — Z955 Presence of coronary angioplasty implant and graft: Secondary | ICD-10-CM

## 2018-01-01 NOTE — Progress Notes (Signed)
Reviewed home exercise guidelines with patient including endpoints, temperature precautions, target heart rate and rate of perceived exertion. Pt states that he walks 30 minutes, 5 days/week at a local park as his mode of home exercise. Discussed the [facility] program with patient to help with balance, and pt is interested in a referral to [facility] upon completion of the cardiac rehab program. Pt voices understanding of instructions given. Sol Passer, MS, ACSM CEP

## 2018-01-03 ENCOUNTER — Encounter (HOSPITAL_COMMUNITY)
Admission: RE | Admit: 2018-01-03 | Discharge: 2018-01-03 | Disposition: A | Discharge status: Home / Self Care | Payer: PPO | Source: Ambulatory Visit | Attending: Cardiovascular Disease | Admitting: Cardiovascular Disease

## 2018-01-03 ENCOUNTER — Encounter (HOSPITAL_COMMUNITY): Payer: PPO

## 2018-01-03 DIAGNOSIS — I214 Non-ST elevation (NSTEMI) myocardial infarction: Secondary | ICD-10-CM | POA: Insufficient documentation

## 2018-01-03 DIAGNOSIS — Z955 Presence of coronary angioplasty implant and graft: Secondary | ICD-10-CM | POA: Insufficient documentation

## 2018-01-03 DIAGNOSIS — Z7982 Long term (current) use of aspirin: Secondary | ICD-10-CM | POA: Insufficient documentation

## 2018-01-03 DIAGNOSIS — E785 Hyperlipidemia, unspecified: Secondary | ICD-10-CM | POA: Diagnosis not present

## 2018-01-03 DIAGNOSIS — Z7902 Long term (current) use of antithrombotics/antiplatelets: Secondary | ICD-10-CM | POA: Diagnosis not present

## 2018-01-03 DIAGNOSIS — F329 Major depressive disorder, single episode, unspecified: Secondary | ICD-10-CM | POA: Insufficient documentation

## 2018-01-03 DIAGNOSIS — I2510 Atherosclerotic heart disease of native coronary artery without angina pectoris: Secondary | ICD-10-CM | POA: Insufficient documentation

## 2018-01-03 DIAGNOSIS — I10 Essential (primary) hypertension: Secondary | ICD-10-CM | POA: Insufficient documentation

## 2018-01-03 DIAGNOSIS — G4731 Primary central sleep apnea: Secondary | ICD-10-CM | POA: Diagnosis not present

## 2018-01-03 DIAGNOSIS — Z79899 Other long term (current) drug therapy: Secondary | ICD-10-CM | POA: Insufficient documentation

## 2018-01-03 DIAGNOSIS — K219 Gastro-esophageal reflux disease without esophagitis: Secondary | ICD-10-CM | POA: Insufficient documentation

## 2018-01-05 ENCOUNTER — Encounter (HOSPITAL_COMMUNITY): Payer: PPO

## 2018-01-05 ENCOUNTER — Encounter (HOSPITAL_COMMUNITY)
Admission: RE | Admit: 2018-01-05 | Discharge: 2018-01-05 | Disposition: A | Discharge status: Home / Self Care | Payer: PPO | Source: Ambulatory Visit | Attending: Cardiovascular Disease | Admitting: Cardiovascular Disease

## 2018-01-05 DIAGNOSIS — I214 Non-ST elevation (NSTEMI) myocardial infarction: Secondary | ICD-10-CM

## 2018-01-05 DIAGNOSIS — Z955 Presence of coronary angioplasty implant and graft: Secondary | ICD-10-CM

## 2018-01-08 ENCOUNTER — Encounter (HOSPITAL_COMMUNITY)
Admission: RE | Admit: 2018-01-08 | Discharge: 2018-01-08 | Disposition: A | Discharge status: Home / Self Care | Payer: PPO | Source: Ambulatory Visit | Attending: Cardiovascular Disease | Admitting: Cardiovascular Disease

## 2018-01-08 DIAGNOSIS — I214 Non-ST elevation (NSTEMI) myocardial infarction: Secondary | ICD-10-CM

## 2018-01-08 DIAGNOSIS — Z955 Presence of coronary angioplasty implant and graft: Secondary | ICD-10-CM

## 2018-01-10 ENCOUNTER — Encounter (HOSPITAL_COMMUNITY): Payer: PPO

## 2018-01-10 ENCOUNTER — Encounter (HOSPITAL_COMMUNITY)
Admission: RE | Admit: 2018-01-10 | Discharge: 2018-01-10 | Disposition: A | Discharge status: Home / Self Care | Payer: PPO | Source: Ambulatory Visit | Attending: Cardiovascular Disease | Admitting: Cardiovascular Disease

## 2018-01-10 DIAGNOSIS — I214 Non-ST elevation (NSTEMI) myocardial infarction: Secondary | ICD-10-CM | POA: Diagnosis not present

## 2018-01-10 DIAGNOSIS — Z955 Presence of coronary angioplasty implant and graft: Secondary | ICD-10-CM

## 2018-01-12 ENCOUNTER — Encounter (HOSPITAL_COMMUNITY): Payer: PPO

## 2018-01-17 ENCOUNTER — Encounter (HOSPITAL_COMMUNITY): Payer: PPO

## 2018-01-17 ENCOUNTER — Encounter (HOSPITAL_COMMUNITY)
Admission: RE | Admit: 2018-01-17 | Discharge: 2018-01-17 | Disposition: A | Discharge status: Home / Self Care | Payer: PPO | Source: Ambulatory Visit | Attending: Cardiovascular Disease | Admitting: Cardiovascular Disease

## 2018-01-17 DIAGNOSIS — I214 Non-ST elevation (NSTEMI) myocardial infarction: Secondary | ICD-10-CM

## 2018-01-17 DIAGNOSIS — Z955 Presence of coronary angioplasty implant and graft: Secondary | ICD-10-CM

## 2018-01-18 NOTE — Progress Notes (Signed)
Cardiac Individual Treatment Plan  Patient Details  Name: John Blackburn MRN: [id number] Date of Birth: [birth year] Referring Provider:     CARDIAC REHAB PHASE II ORIENTATION from 10/03/2017 in [place]  Referring Provider  Dr Lorie Phenix       Initial Encounter Date:    CARDIAC REHAB PHASE II ORIENTATION from 10/03/2017 in [place]  Date  10/03/17  Referring Provider  Dr Lorie Phenix       Visit Diagnosis: NSTEMI (non-ST elevated myocardial infarction) (HCC)08/30/17  Status post coronary artery stent placement 09/01/17 S/P DES Mid RCA  Patient's Home Medications on Admission:  Current Outpatient Medications:  .  aspirin EC 81 MG EC tablet, Take 1 tablet (81 mg total) by mouth daily., Disp: , Rfl:  .  atenolol-chlorthalidone (TENORETIC) 50-25 MG tablet, TAKE ONE TABLET BY MOUTH ONCE DAILY, Disp: 90 tablet, Rfl: 3 .  atorvastatin (LIPITOR) 80 MG tablet, Take 1 tablet (80 mg total) by mouth daily at 6 PM., Disp: 90 tablet, Rfl: 1 .  buPROPion (WELLBUTRIN XL) 300 MG 24 hr tablet, TAKE 1 TABLET BY MOUTH ONCE DAILY, Disp: 30 tablet, Rfl: 2 .  clopidogrel (PLAVIX) 75 MG tablet, Take 1 tablet (75 mg total) by mouth daily., Disp: 90 tablet, Rfl: 2 .  furosemide (LASIX) 40 MG tablet, TAKE ONE TABLET BY MOUTH ONCE DAILY, Disp: 30 tablet, Rfl: 3 .  loratadine (CLARITIN) 10 MG tablet, Take 10 mg by mouth daily., Disp: , Rfl:  .  Melatonin 1 MG/ML LIQD, Take 2.5 mg by mouth at bedtime. , Disp: , Rfl:  .  nitroGLYCERIN (NITROSTAT) 0.4 MG SL tablet, Place 1 tablet (0.4 mg total) under the tongue every 5 (five) minutes as needed. (Patient not taking: Reported on 12/21/2017), Disp: 25 tablet, Rfl: 2 .  Oxymetazoline HCl (VICKS SINEX MOISTURIZING NA), Place 1 spray into the nose daily as needed (congestion)., Disp: , Rfl:  .  pantoprazole (PROTONIX) 40 MG tablet, Take 1 tablet (40 mg total) by mouth daily., Disp: 30 tablet,  Rfl: 1 .  potassium chloride SA (K-DUR,KLOR-CON) 20 MEQ tablet, Take 2 tablets (40 mEq total) by mouth daily., Disp: 60 tablet, Rfl: 3 .  sertraline (ZOLOFT) 100 MG tablet, Take 1 tablet (100 mg total) by mouth daily., Disp: 30 tablet, Rfl: 3 .  tetrahydrozoline 0.05 % ophthalmic solution, Place 1 drop into both eyes daily as needed (dry eyes)., Disp: , Rfl:  .  Turmeric 500 MG CAPS, Take by mouth., Disp: , Rfl:  .  Vitamin D, Ergocalciferol, (DRISDOL) 50000 units CAPS capsule, Take 1 capsule (50,000 Units total) by mouth every 7 (seven) days., Disp: 12 capsule, Rfl: 0  Past Medical History: Past Medical History:  Diagnosis Date  . Allergy   . 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 CAD (coronary artery disease)    08/2017 PCI/DES mRCA, RPDA, CTO pf pLCX with collaterals, normal EF  . Depression    on multiple meds. Has been seen at [facility]. and Dr. Sabra Heck is his prescriber  . Diverticulitis of colon 2000 's   treated as an outpatient  . GERD (gastroesophageal reflux disease)    UGI done August '12 - ulcer/. Dr. Ferdinand Lango, gastroenterologist in [facility].   [name] Hx of adenomatous colonic polyps   . Hyperlipidemia    last lipid panel: HDL 44, LDL 117  . Hypertension   . Peptic ulcer    in the  past and just recently-August '12  . Pulmonary emboli [facility])    noted October 2014 - treated by Dr. Linda Hedges  . skin cancer    skin CA  . Sleep apnea, primary central    wears CPAP    Tobacco Use: Social History   Tobacco Use  Smoking Status Never Smoker  Smokeless Tobacco Never Used    Labs: Recent Review Flowsheet Data    Labs for ITP Cardiac and Pulmonary Rehab Latest Ref Rng & Units 08/30/2017 09/01/2017 09/01/2017 09/01/2017 11/27/2017   Cholestrol 0 - 200 mg/dL - 166 - - -   LDLCALC 0 - 99 mg/dL - 114(H) - - -   LDLDIRECT mg/dL - - - - -   HDL >40 mg/dL - 30(L) - - -   Trlycerides <150 mg/dL - 109 - - -   Hemoglobin A1c 4.6 - 6.5 % - - - - 6.3   PHART 7.350 -  7.450 - - 7.392 - -   PCO2ART 32.0 - 48.0 mmHg - - 41.5 - -   HCO3 20.0 - 28.0 mmol/L - - 25.2 26.1 -   TCO2 22 - 32 mmol/L 28 - 26 27 -   O2SAT % - - 97.0 71.0 -      Capillary Blood Glucose: No results found for: GLUCAP   Exercise Target Goals:    Exercise Program Goal: Individual exercise prescription set using results from initial 6 min walk test and THRR while considering  patient's activity barriers and safety.   Exercise Prescription Goal: Initial exercise prescription builds to 30-45 minutes a day of aerobic activity, 2-3 days per week.  Home exercise guidelines will be given to patient during program as part of exercise prescription that the participant will acknowledge.  Activity Barriers & Risk Stratification: Activity Barriers & Cardiac Risk Stratification - 10/03/17 1457      Activity Barriers & Cardiac Risk Stratification   Activity Barriers  Right Knee Replacement;Left Knee Replacement;Arthritis;Muscular Weakness;Balance Concerns;Assistive Device;History of Falls       6 Minute Walk: 6 Minute Walk    Row Name 10/03/17 1441 10/03/17 1457       6 Minute Walk   Phase  Initial  -    Distance  897 feet  -    Walk Time  6 minutes  -    # of Rest Breaks  0  -    MPH  1.7  -    METS  1.56  -    RPE  13  -    VO2 Peak  5.5  -    Symptoms  No  -    Resting HR  56 bpm  -    Resting BP  104/56  -    Resting Oxygen Saturation   96 %  -    Exercise Oxygen Saturation  during 6 min walk  94 % encouraged PLB, O2 increased to 96%  -    Max Ex. HR  66 bpm  -    Max Ex. BP  124/74  -    2 Minute Post BP  -  104/60       Oxygen Initial Assessment:   Oxygen Re-Evaluation:   Oxygen Discharge (Final Oxygen Re-Evaluation):   Initial Exercise Prescription: Initial Exercise Prescription - 10/03/17 1500      Date of Initial Exercise RX and Referring Provider   Date  10/03/17    Referring Provider  Dr Lorie Phenix       NuStep  Level  1    SPM  50     Minutes  10    METs  1      Arm Ergometer   Level  1    RPM  10    Minutes  10    METs  1      Track   Laps  6    Minutes  10    METs  2.03      Prescription Details   Frequency (times per week)  3    Duration  Progress to 30 minutes of continuous aerobic without signs/symptoms of physical distress      Intensity   THRR 40-80% of Max Heartrate  60-119    Ratings of Perceived Exertion  11-15    Perceived Dyspnea  0-4      Progression   Progression  Continue to progress workloads to maintain intensity without signs/symptoms of physical distress.      Resistance Training   Training Prescription  Yes    Weight  1lb    Reps  10-15       Perform Capillary Blood Glucose checks as needed.  Exercise Prescription Changes: Exercise Prescription Changes    Row Name 10/25/17 1502 11/17/17 1504 11/27/17 1446 12/11/17 1448 12/27/17 1508     Response to Exercise   Blood Pressure (Admit)  109/72  100/60  118/62  100/60  120/60   Blood Pressure (Exercise)  100/72  116/60  118/66  120/60  106/60   Blood Pressure (Exit)  118/60  100/62  108/70  100/74  102/60   Heart Rate (Admit)  55 bpm  62 bpm  69 bpm  76 bpm  53 bpm   Heart Rate (Exercise)  94 bpm  75 bpm  74 bpm  85 bpm  74 bpm   Heart Rate (Exit)  55 bpm  60 bpm  63 bpm  63 bpm  53 bpm   Rating of Perceived Exertion (Exercise)  _0 Symptoms  none  none  none  none  none   Duration  Progress to 30 minutes of  aerobic without signs/symptoms of physical distress  Progress to 30 minutes of  aerobic without signs/symptoms of physical distress  Progress to 30 minutes of  aerobic without signs/symptoms of physical distress  Progress to 30 minutes of  aerobic without signs/symptoms of physical distress  Progress to 30 minutes of  aerobic without signs/symptoms of physical distress   Intensity  THRR unchanged  THRR unchanged  THRR unchanged  THRR unchanged  THRR unchanged     Progression   Progression  Continue to  progress workloads to maintain intensity without signs/symptoms of physical distress.  Continue to progress workloads to maintain intensity without signs/symptoms of physical distress.  Continue to progress workloads to maintain intensity without signs/symptoms of physical distress.  Continue to progress workloads to maintain intensity without signs/symptoms of physical distress.  Continue to progress workloads to maintain intensity without signs/symptoms of physical distress.   Average METs  1.6  2.1  2.4  2.8  2.2     Resistance Training   Training Prescription  No Relaxation today, no weights.  Yes  Yes  Yes  No Relaxation day, no weights.   Weight  -  2lbs  2lbs  1lb  -   Reps  -  10-15  10-15  10-15  -   Time  -  10 Minutes  10  Minutes  10 Minutes  -     Interval Training   Interval Training  No  No  No  No  No     NuStep   Level  _0 -  4   SPM  68  68  68  -  70   Minutes  _1 -  20   METs  1.6  1.6  1.7  -  2     Arm Ergometer   Level  _2 watts  1 41 watts  1 40 watts  -   Minutes  _3 -   METs  1.5  2.25  2.96  2.96  -     Track   Laps  -  _4 Minutes  _5 METs  -  2.57  2.57  2.74  2.39     Home Exercise Plan   Plans to continue exercise at  -  -  -  Home (comment)  Home (comment)   Frequency  -  -  -  Add 2 additional days to program exercise sessions.  Add 2 additional days to program exercise sessions.   Initial Home Exercises Provided  -  -  -  -  12/27/17   Row Name 01/08/18 1453 01/17/18 1510           Response to Exercise   Blood Pressure (Admit)  102/60  102/60      Blood Pressure (Exercise)  144/60  100/62      Blood Pressure (Exit)  104/69  104/60      Heart Rate (Admit)  60 bpm  61 bpm      Heart Rate (Exercise)  83 bpm  75 bpm      Heart Rate (Exit)  60 bpm  61 bpm      Rating of Perceived Exertion (Exercise)  11  11      Symptoms  none  none      Duration  Progress to 30 minutes of  aerobic  without signs/symptoms of physical distress  Progress to 30 minutes of  aerobic without signs/symptoms of physical distress      Intensity  THRR unchanged  THRR unchanged        Progression   Progression  Continue to progress workloads to maintain intensity without signs/symptoms of physical distress.  Continue to progress workloads to maintain intensity without signs/symptoms of physical distress.      Average METs  2.5  2.8        Resistance Training   Training Prescription  Yes  No Relaxation day, no weights.      Weight  3lbs  -      Reps  10-15  -      Time  10 Minutes  -        Interval Training   Interval Training  No  No        NuStep   Level  4  4      SPM  70  70      Minutes  20  20      METs  2.2  3.2        Track   Laps  10  8      Minutes  10  10  METs  2.74  2.39        Home Exercise Plan   Plans to continue exercise at  Home (comment)  Home (comment)      Frequency  Add 2 additional days to program exercise sessions.  Add 2 additional days to program exercise sessions.      Initial Home Exercises Provided  12/27/17  12/27/17         Exercise Comments: Exercise Comments    Row Name 10/25/17 1632 11/21/17 1539 11/27/17 1446 12/11/17 1448 12/27/17 1508   Exercise Comments  Off to a good start with exercise.  Making slow progress with exercise. Increase workloads as tolerated.  Reviewed METs with patient.  Reviewed METs and goals with patient.  Reviewed METs and home exercise with patient.   [place] Name 01/01/18 1527 01/17/18 1510         Exercise Comments  Reviewed home exercise guidelines and goals with patient.  Reviewed METs and goals with patient.         Exercise Goals and Review: Exercise Goals    Row Name 10/03/17 1357             Exercise Goals   Increase Physical Activity  Yes       Intervention  Provide advice, education, support and counseling about physical activity/exercise needs.;Develop an individualized exercise prescription for  aerobic and resistive training based on initial evaluation findings, risk stratification, comorbidities and participant's personal goals.       Expected Outcomes  Short Term: Attend rehab on a regular basis to increase amount of physical activity.;Long Term: Exercising regularly at least 3-5 days a week.;Long Term: Add in home exercise to make exercise part of routine and to increase amount of physical activity.       Increase Strength and Stamina  Yes be able to walk 1 mile       Intervention  Provide advice, education, support and counseling about physical activity/exercise needs.;Develop an individualized exercise prescription for aerobic and resistive training based on initial evaluation findings, risk stratification, comorbidities and participant's personal goals.       Expected Outcomes  Short Term: Increase workloads from initial exercise prescription for resistance, speed, and METs.;Short Term: Perform resistance training exercises routinely during rehab and add in resistance training at home;Long Term: Improve cardiorespiratory fitness, muscular endurance and strength as measured by increased METs and functional capacity (6MWT)       Able to understand and use rate of perceived exertion (RPE) scale  Yes       Intervention  Provide education and explanation on how to use RPE scale       Expected Outcomes  Short Term: Able to use RPE daily in rehab to express subjective intensity level;Long Term:  Able to use RPE to guide intensity level when exercising independently       Knowledge and understanding of Target Heart Rate Range (THRR)  Yes       Intervention  Provide education and explanation of THRR including how the numbers were predicted and where they are located for reference       Expected Outcomes  Short Term: Able to state/look up THRR;Long Term: Able to use THRR to govern intensity when exercising independently;Short Term: Able to use daily as guideline for intensity in rehab       Able to  check pulse independently  Yes       Intervention  Provide education and demonstration on how to check pulse in carotid and  radial arteries.;Review the importance of being able to check your own pulse for safety during independent exercise       Expected Outcomes  Short Term: Able to explain why pulse checking is important during independent exercise;Long Term: Able to check pulse independently and accurately       Understanding of Exercise Prescription  Yes       Intervention  Provide education, explanation, and written materials on patient's individual exercise prescription       Expected Outcomes  Short Term: Able to explain program exercise prescription;Long Term: Able to explain home exercise prescription to exercise independently          Exercise Goals Re-Evaluation : Exercise Goals Re-Evaluation    [place] Name 10/25/17 1631 11/21/17 1538 12/11/17 1448 12/27/17 1508 01/01/18 1527     Exercise Goal Re-Evaluation   Exercise Goals Review  Increase Physical Activity;Able to understand and use rate of perceived exertion (RPE) scale  Increase Physical Activity  Increase Physical Activity  Increase Physical Activity;Understanding of Exercise Prescription  Understanding of Exercise Prescription   Comments  Patient tolerating low intensity exercise without c/o. Patient understands and is able to use the RPE scale appropriately.  Patient continues to make slow progress with exercise.  Patient states that he walks at the park 30 minutes 2 days/week. Pt states he does not use an assistive device when he walks at the park, but I encouraged him to do so, since he has balance/gait issues, and has fallen at the park previously.   Discussed home exercise with patient, and pt states that he walks in his driveway. Pt isn't using a can or walker at home. Will give patient some chair exercises to do at home and work on increasing workloads at CR. Switched from arm crank to 20 minutes on stepper to increase strength.   Reviewed home exercise guidelines with patient including temperature precautions and endpoints for exercise. Patient states that he walks 30 minutes at local park 5 days/week. Encouraged patient to use assistive device and also discussed BOOST program with patient to help improve balance, and pt is interested in being referred to the BOOST program.   Expected Outcomes  Increase workloads as tolerated to help achieve health and fitness goals.  Continue increasing workloads as tolerated.  Patient will walk 30 minutes as tolerated on a level surface 2 days/week in addition to exercise at CR.  Increase workloads as tolerated to help increase strength and endurance.  Continue progressing workloads to help improve strength and endurance.   [place] Name 01/17/18 1510             Exercise Goal Re-Evaluation   Exercise Goals Review  Increase Physical Activity       Comments  Patient is doing some walking in the house, but home exercise is limited by balance concerns. Patient is still interested in being referred to the BOOST program upon graduation from the [facility] program in 2 weeks. Patient's MET level was higher on the recumbent stepper.       Expected Outcomes  Increase workloads as tolerated to help achieve health and fitness goals.           Discharge Exercise Prescription (Final Exercise Prescription Changes): Exercise Prescription Changes - 01/17/18 1510      Response to Exercise   Blood Pressure (Admit)  102/60    Blood Pressure (Exercise)  100/62    Blood Pressure (Exit)  104/60    Heart Rate (Admit)  61 bpm  Heart Rate (Exercise)  75 bpm    Heart Rate (Exit)  61 bpm    Rating of Perceived Exertion (Exercise)  11    Symptoms  none    Duration  Progress to 30 minutes of  aerobic without signs/symptoms of physical distress    Intensity  THRR unchanged      Progression   Progression  Continue to progress workloads to maintain intensity without signs/symptoms of physical distress.    Average  METs  2.8      Resistance Training   Training Prescription  No Relaxation day, no weights.      Interval Training   Interval Training  No      NuStep   Level  4    SPM  70    Minutes  20    METs  3.2      Track   Laps  8    Minutes  10    METs  2.39      Home Exercise Plan   Plans to continue exercise at  Home (comment)    Frequency  Add 2 additional days to program exercise sessions.    Initial Home Exercises Provided  12/27/17       Nutrition:  Target Goals: Understanding of nutrition guidelines, daily intake of sodium <1554m, cholesterol <2050m calories 30% from fat and 7% or less from saturated fats, daily to have 5 or more servings of fruits and vegetables.  Biometrics: Pre Biometrics - 10/03/17 1442      Pre Biometrics   Height  6' (1.829 m)    Weight  235 lb 7.2 oz (106.8 kg)    Waist Circumference  42.5 inches    Hip Circumference  44.5 inches    Waist to Hip Ratio  0.96 %    BMI (Calculated)  31.93    Triceps Skinfold  18 mm    % Body Fat  30.7 %    Grip Strength  27 kg    Flexibility  9 in    Single Leg Stand  1 seconds        Nutrition Therapy Plan and Nutrition Goals: Nutrition Therapy & Goals - 10/04/17 1157      Nutrition Therapy   Diet  Heart Healthy      Intervention Plan   Intervention  Prescribe, educate and counsel regarding individualized specific dietary modifications aiming towards targeted core components such as weight, hypertension, lipid management, diabetes, heart failure and other comorbidities.    Expected Outcomes  Short Term Goal: Understand basic principles of dietary content, such as calories, fat, sodium, cholesterol and nutrients.;Long Term Goal: Adherence to prescribed nutrition plan.       Nutrition Assessments: Nutrition Assessments - 11/06/17 1545      MEDFICTS Scores   Pre Score  48       Nutrition Goals Re-Evaluation:   Nutrition Goals Re-Evaluation:   Nutrition Goals Discharge (Final Nutrition Goals  Re-Evaluation):   Psychosocial: Target Goals: Acknowledge presence or absence of significant depression and/or stress, maximize coping skills, provide positive support system. Participant is able to verbalize types and ability to use techniques and skills needed for reducing stress and depression.  Initial Review & Psychosocial Screening: Initial Psych Review & Screening - 10/03/17 1545      Initial Review   Current issues with  History of Depression      Family Dynamics   Good Support System?  Yes    Concerns  -- WhLorenda Ishiharaays he went  through a bad divorce 14 years Blackburn      Barriers   Psychosocial barriers to participate in program  The patient should benefit from training in stress management and relaxation.      Screening Interventions   Interventions  Encouraged to exercise;To provide support and resources with identified psychosocial needs    Expected Outcomes  Long Term Goal: Stressors or current issues are controlled or eliminated.;Short Term goal: Identification and review with participant of any Quality of Life or Depression concerns found by scoring the questionnaire.;Long Term goal: The participant improves quality of Life and PHQ9 Scores as seen by post scores and/or verbalization of changes       Quality of Life Scores: Quality of Life - 10/24/17 1419      Quality of Life Scores   Health/Function Pre  19.23 %    Socioeconomic Pre  18.21 %    Psych/Spiritual Pre  25 %    Family Pre  19.38 %    GLOBAL Pre  20.26 %      Scores of 19 and below usually indicate a poorer quality of life in these areas.  A difference of  2-3 points is a clinically meaningful difference.  A difference of 2-3 points in the total score of the Quality of Life Index has been associated with significant improvement in overall quality of life, self-image, physical symptoms, and general health in studies assessing change in quality of life.  PHQ-9: Recent Review Flowsheet Data    Depression screen  [facility] 2/9 10/23/2017 10/17/2017 10/03/2017 10/03/2017 09/13/2017   Decreased Interest 1 1 (No Data)  0 0   Down, Depressed, Hopeless 3 3 - 0 0   PHQ - 2 Score 4 4 - 0 0   Altered sleeping 0 0 - - -   Tired, decreased energy 1 1 - - -   Change in appetite 0 0 - - -   Feeling bad or failure about yourself  3 3 - - -   Trouble concentrating 0 0 - - -   Moving slowly or fidgety/restless 0 0 - - -   Suicidal thoughts 1 1 - - -   PHQ-9 Score 9 9 - - -   Difficult doing work/chores Somewhat difficult Somewhat difficult - - -     Interpretation of Total Score  Total Score Depression Severity:  1-4 = Minimal depression, 5-9 = Mild depression, 10-14 = Moderate depression, 15-19 = Moderately severe depression, 20-27 = Severe depression   Psychosocial Evaluation and Intervention:   Psychosocial Re-Evaluation: Psychosocial Re-Evaluation    Row Name 10/26/17 1522 11/23/17 1528 12/21/17 1144 01/18/18 1351       Psychosocial Re-Evaluation   Current issues with  History of Depression  History of Depression  History of Depression  History of Depression    Comments  will review QOL scores with the patient this week  John Blackburn has an appointment to discuss some recent problems with his memory with Dr Sharlet Salina on 11/27/17  John Blackburn has denied being depressed during exercise at cardiac rehab. Will continue to monitor  John Blackburn has denied being depressed during exercise at cardiac rehab. Will continue to monitor    Interventions  Stress management education  Encouraged to attend Pulmonary Rehabilitation for the exercise;Encouraged to attend Cardiac Rehabilitation for the exercise  Encouraged to attend Cardiac Rehabilitation for the exercise;Stress management education  Encouraged to attend Cardiac Rehabilitation for the exercise;Stress management education    Continue Psychosocial Services   Follow up  required by staff  Follow up required by staff  Follow up required by staff  Follow up required by staff       Psychosocial  Discharge (Final Psychosocial Re-Evaluation): Psychosocial Re-Evaluation - 01/18/18 1351      Psychosocial Re-Evaluation   Current issues with  History of Depression    Comments  John Blackburn has denied being depressed during exercise at cardiac rehab. Will continue to monitor    Interventions  Encouraged to attend Cardiac Rehabilitation for the exercise;Stress management education    Continue Psychosocial Services   Follow up required by staff       Vocational Rehabilitation: Provide vocational rehab assistance to qualifying candidates.   Vocational Rehab Evaluation & Intervention: Vocational Rehab - 10/03/17 1543      Initial Vocational Rehab Evaluation & Intervention   Assessment shows need for Vocational Rehabilitation  Yes John Blackburn is retired but says he is interested in vocational rehab for job retraining     Vocational Rehab Packet given to patient  10/03/17       Education: Education Goals: Education classes will be provided on a weekly basis, covering required topics. Participant will state understanding/return demonstration of topics presented.  Learning Barriers/Preferences: Learning Barriers/Preferences - 10/03/17 1459      Learning Barriers/Preferences   Learning Barriers  Hearing;Sight       Education Topics: Count Your Pulse:  -Group instruction provided by verbal instruction, demonstration, patient participation and written materials to support subject.  Instructors address importance of being able to find your pulse and how to count your pulse when at home without a heart monitor.  Patients get hands on experience counting their pulse with staff help and individually.   CARDIAC REHAB PHASE II EXERCISE from 01/17/2018 in [place]  Date  12/15/17  Instruction Review Code  2- Demonstrated Understanding      Heart Attack, Angina, and Risk Factor Modification:  -Group instruction provided by verbal instruction, video, and written materials  to support subject.  Instructors address signs and symptoms of angina and heart attacks.    Also discuss risk factors for heart disease and how to make changes to improve heart health risk factors.   CARDIAC REHAB PHASE II EXERCISE from 01/17/2018 in [place]  Date  12/27/17  Instruction Review Code  2- Demonstrated Understanding      Functional Fitness:  -Group instruction provided by verbal instruction, demonstration, patient participation, and written materials to support subject.  Instructors address safety measures for doing things around the house.  Discuss how to get up and down off the floor, how to pick things up properly, how to safely get out of a chair without assistance, and balance training.   CARDIAC REHAB PHASE II EXERCISE from 01/17/2018 in [place]  Date  12/29/17  Educator  EP  Instruction Review Code  2- Demonstrated Understanding      Meditation and Mindfulness:  -Group instruction provided by verbal instruction, patient participation, and written materials to support subject.  Instructor addresses importance of mindfulness and meditation practice to help reduce stress and improve awareness.  Instructor also leads participants through a meditation exercise.    CARDIAC REHAB PHASE II EXERCISE from 01/17/2018 in [place]  Date  01/17/18  Educator  Jeanella Craze  Instruction Review Code  2- Demonstrated Understanding      Stretching for Flexibility and Mobility:  -Group instruction provided by verbal instruction,  patient participation, and written materials to support subject.  Instructors lead participants through series of stretches that are designed to increase flexibility thus improving mobility.  These stretches are additional exercise for major muscle groups that are typically performed during regular warm up and cool down.   Hands Only CPR:  -Group verbal, video,  and participation provides a basic overview of AHA guidelines for community CPR. Role-play of emergencies allow participants the opportunity to practice calling for help and chest compression technique with discussion of AED use.   Hypertension: -Group verbal and written instruction that provides a basic overview of hypertension including the most recent diagnostic guidelines, risk factor reduction with self-care instructions and medication management.   CARDIAC REHAB PHASE II EXERCISE from 01/17/2018 in [place]  Date  01/05/18  Instruction Review Code  2- Demonstrated Understanding       Nutrition I class: Heart Healthy Eating:  -Group instruction provided by PowerPoint slides, verbal discussion, and written materials to support subject matter. The instructor gives an explanation and review of the Therapeutic Lifestyle Changes diet recommendations, which includes a discussion on lipid goals, dietary fat, sodium, fiber, plant stanol/sterol esters, sugar, and the components of a well-balanced, healthy diet.   Nutrition II class: Lifestyle Skills:  -Group instruction provided by PowerPoint slides, verbal discussion, and written materials to support subject matter. The instructor gives an explanation and review of label reading, grocery shopping for heart health, heart healthy recipe modifications, and ways to make healthier choices when eating out.   Diabetes Question & Answer:  -Group instruction provided by PowerPoint slides, verbal discussion, and written materials to support subject matter. The instructor gives an explanation and review of diabetes co-morbidities, pre- and post-prandial blood glucose goals, pre-exercise blood glucose goals, signs, symptoms, and treatment of hypoglycemia and hyperglycemia, and foot care basics.   CARDIAC REHAB PHASE II EXERCISE from 01/17/2018 in [place]  Date  12/08/17  Educator  RD    Instruction Review Code  2- Demonstrated Understanding      Diabetes Blitz:  -Group instruction provided by PowerPoint slides, verbal discussion, and written materials to support subject matter. The instructor gives an explanation and review of the physiology behind type 1 and type 2 diabetes, diabetes medications and rational behind using different medications, pre- and post-prandial blood glucose recommendations and Hemoglobin A1c goals, diabetes diet, and exercise including blood glucose guidelines for exercising safely.    Portion Distortion:  -Group instruction provided by PowerPoint slides, verbal discussion, written materials, and food models to support subject matter. The instructor gives an explanation of serving size versus portion size, changes in portions sizes over the last 20 years, and what consists of a serving from each food group.   CARDIAC REHAB PHASE II EXERCISE from 01/17/2018 in [place]  Date  11/29/17  Educator  RD  Instruction Review Code  2- Demonstrated Understanding      Stress Management:  -Group instruction provided by verbal instruction, video, and written materials to support subject matter.  Instructors review role of stress in heart disease and how to cope with stress positively.     Exercising on Your Own:  -Group instruction provided by verbal instruction, power point, and written materials to support subject.  Instructors discuss benefits of exercise, components of exercise, frequency and intensity of exercise, and end points for exercise.  Also discuss use of nitroglycerin and activating EMS.  Review options of places to  exercise outside of rehab.  Review guidelines for sex with heart disease.   CARDIAC REHAB PHASE II EXERCISE from 01/17/2018 in [place]  Date  01/03/18  Educator  EP  Instruction Review Code  2- Demonstrated Understanding      Cardiac Drugs I:  -Group instruction  provided by verbal instruction and written materials to support subject.  Instructor reviews cardiac drug classes: antiplatelets, anticoagulants, beta blockers, and statins.  Instructor discusses reasons, side effects, and lifestyle considerations for each drug class.   CARDIAC REHAB PHASE II EXERCISE from 01/17/2018 in [place]  Date  01/10/18  Educator  -- [pharmacy]  Instruction Review Code  2- Demonstrated Understanding      Cardiac Drugs II:  -Group instruction provided by verbal instruction and written materials to support subject.  Instructor reviews cardiac drug classes: angiotensin converting enzyme inhibitors (ACE-I), angiotensin II receptor blockers (ARBs), nitrates, and calcium channel blockers.  Instructor discusses reasons, side effects, and lifestyle considerations for each drug class.   Anatomy and Physiology of the Circulatory System:  Group verbal and written instruction and models provide basic cardiac anatomy and physiology, with the coronary electrical and arterial systems. Review of: AMI, Angina, Valve disease, Heart Failure, Peripheral Artery Disease, Cardiac Arrhythmia, Pacemakers, and the ICD.   CARDIAC REHAB PHASE II EXERCISE from 01/17/2018 in [place]  Date  12/20/17  Instruction Review Code  2- Demonstrated Understanding      Other Education:  -Group or individual verbal, written, or video instructions that support the educational goals of the cardiac rehab program.   Holiday Eating Survival Tips:  -Group instruction provided by PowerPoint slides, verbal discussion, and written materials to support subject matter. The instructor gives patients tips, tricks, and techniques to help them not only survive but enjoy the holidays despite the onslaught of food that accompanies the holidays.   Knowledge Questionnaire Score: Knowledge Questionnaire Score - 10/24/17 1420      Knowledge Questionnaire  Score   Pre Score  21/24       Core Components/Risk Factors/Patient Goals at Admission: Personal Goals and Risk Factors at Admission - 10/03/17 1457      Core Components/Risk Factors/Patient Goals on Admission    Weight Management  Yes;Obesity;Weight Maintenance    Intervention  Weight Management: Develop a combined nutrition and exercise program designed to reach desired caloric intake, while maintaining appropriate intake of nutrient and fiber, sodium and fats, and appropriate energy expenditure required for the weight goal.;Weight Management/Obesity: Establish reasonable short term and long term weight goals.;Obesity: Provide education and appropriate resources to help participant work on and attain dietary goals.;Weight Management: Provide education and appropriate resources to help participant work on and attain dietary goals.    Admit Weight  235 lb 7.2 oz (106.8 kg)    Goal Weight: Short Term  230 lb (104.3 kg)    Goal Weight: Long Term  225 lb (102.1 kg)    Expected Outcomes  Short Term: Continue to assess and modify interventions until short term weight is achieved;Long Term: Adherence to nutrition and physical activity/exercise program aimed toward attainment of established weight goal;Weight Loss: Understanding of general recommendations for a balanced deficit meal plan, which promotes 1-2 lb weight loss per week and includes a negative energy balance of [phone] kcal/d;Weight Maintenance: Understanding of the daily nutrition guidelines, which includes 25-35% calories from fat, 7% or less cal from saturated fats, less than 266m cholesterol, less than  1.5gm of sodium, & 5 or more servings of fruits and vegetables daily;Understanding recommendations for meals to include 15-35% energy as protein, 25-35% energy from fat, 35-60% energy from carbohydrates, less than 255m of dietary cholesterol, 20-35 gm of total fiber daily;Understanding of distribution of calorie intake throughout the day with  the consumption of 4-5 meals/snacks    Hypertension  Yes    Intervention  Provide education on lifestyle modifcations including regular physical activity/exercise, weight management, moderate sodium restriction and increased consumption of fresh fruit, vegetables, and low fat dairy, alcohol moderation, and smoking cessation.;Monitor prescription use compliance.    Expected Outcomes  Short Term: Continued assessment and intervention until BP is < 140/942mHG in hypertensive participants. < 130/8046mG in hypertensive participants with diabetes, heart failure or chronic kidney disease.;Long Term: Maintenance of blood pressure at goal levels.       Core Components/Risk Factors/Patient Goals Review:  Goals and Risk Factor Review    Row Name 10/26/17 1516 11/23/17 1523 12/21/17 1142 01/18/18 1350       Core Components/Risk Factors/Patient Goals Review   Personal Goals Review  Weight Management/Obesity;Hypertension  Weight Management/Obesity;Hypertension  Weight Management/Obesity;Hypertension  Weight Management/Obesity;Hypertension    Review  John Blackburn's vital signs have been stable at cardiac rehab so far  John Blackburn's vital signs have been stable at cardiac rehab so far  John Blackburn's vital signs have been stable at cardiac rehab. John Blackburn is enjoying partcipating in phase 2 cardiac rehab. John Blackburn uses a rolling walker for stability  John Blackburn's vital signs have been stable at cardiac rehab. John Blackburn is enjoying partcipating in phase 2 cardiac rehab. John Blackburn uses a rolling walker for stability    Expected Outcomes  John Blackburn will continue to participate in phase 2 cardiac rehab and take his medcations as presribed  John Blackburn will continue to participate in phase 2 cardiac rehab and take his medcations as presribed  John Blackburn will continue to participate in phase 2 cardiac rehab and take his medcations as presribed  John Blackburn will continue to participate in phase 2 cardiac rehab and take his medcations as presribed       Core Components/Risk Factors/Patient  Goals at Discharge (Final Review):  Goals and Risk Factor Review - 01/18/18 1350      Core Components/Risk Factors/Patient Goals Review   Personal Goals Review  Weight Management/Obesity;Hypertension    Review  John Blackburn's vital signs have been stable at cardiac rehab. John Blackburn is enjoying partcipating in phase 2 cardiac rehab. John Blackburn uses a rolling walker for stability    Expected Outcomes  John Blackburn will continue to participate in phase 2 cardiac rehab and take his medcations as presribed       ITP Comments: ITP Comments    Row Name 10/03/17 1346 10/26/17 1513 11/23/17 1520 12/21/17 1142 01/18/18 1350   ITP Comments  Dr. TraFransico Himedical Director  30 day ITP Review. Patient is off to a good start to exercise.  30 day ITP Review. John Blackburn has good partcipation and fair attendance in phase 2 cardiac rehab  30 day ITP Review. John Blackburn has good partcipation and fair attendance in phase 2 cardiac rehab  30 day ITP Review. John Blackburn has good partcipation and fair attendance in phase 2 cardiac rehab      Comments: See ITP comments. Patient may benefit from the boost program upon graduation which  Will be in about 2 weeks. Will check with John Blackburn's primary care physician Dr CraSharlet Salinaior to discharge.MarBarnet PallN,BSN 01/18/2018 1:56 PM

## 2018-01-19 ENCOUNTER — Encounter (HOSPITAL_COMMUNITY): Payer: PPO

## 2018-01-19 ENCOUNTER — Encounter (HOSPITAL_COMMUNITY)
Admission: RE | Admit: 2018-01-19 | Discharge: 2018-01-19 | Disposition: A | Discharge status: Home / Self Care | Payer: PPO | Source: Ambulatory Visit | Attending: Cardiovascular Disease | Admitting: Cardiovascular Disease

## 2018-01-19 DIAGNOSIS — I214 Non-ST elevation (NSTEMI) myocardial infarction: Secondary | ICD-10-CM | POA: Diagnosis not present

## 2018-01-19 DIAGNOSIS — Z955 Presence of coronary angioplasty implant and graft: Secondary | ICD-10-CM

## 2018-01-22 ENCOUNTER — Encounter (HOSPITAL_COMMUNITY)
Admission: RE | Admit: 2018-01-22 | Discharge: 2018-01-22 | Disposition: A | Discharge status: Home / Self Care | Payer: PPO | Source: Ambulatory Visit | Attending: Cardiovascular Disease | Admitting: Cardiovascular Disease

## 2018-01-22 DIAGNOSIS — I214 Non-ST elevation (NSTEMI) myocardial infarction: Secondary | ICD-10-CM

## 2018-01-22 DIAGNOSIS — Z955 Presence of coronary angioplasty implant and graft: Secondary | ICD-10-CM

## 2018-01-24 ENCOUNTER — Encounter (HOSPITAL_COMMUNITY)
Admission: RE | Admit: 2018-01-24 | Discharge: 2018-01-24 | Disposition: A | Discharge status: Home / Self Care | Payer: PPO | Source: Ambulatory Visit | Attending: Cardiovascular Disease | Admitting: Cardiovascular Disease

## 2018-01-24 ENCOUNTER — Encounter (HOSPITAL_COMMUNITY): Payer: PPO

## 2018-01-24 DIAGNOSIS — I214 Non-ST elevation (NSTEMI) myocardial infarction: Secondary | ICD-10-CM

## 2018-01-24 DIAGNOSIS — Z955 Presence of coronary angioplasty implant and graft: Secondary | ICD-10-CM

## 2018-01-25 DIAGNOSIS — G4733 Obstructive sleep apnea (adult) (pediatric): Secondary | ICD-10-CM | POA: Diagnosis not present

## 2018-01-26 ENCOUNTER — Encounter (HOSPITAL_COMMUNITY): Payer: PPO

## 2018-01-26 ENCOUNTER — Encounter (HOSPITAL_COMMUNITY)
Admission: RE | Admit: 2018-01-26 | Discharge: 2018-01-26 | Disposition: A | Discharge status: Home / Self Care | Payer: PPO | Source: Ambulatory Visit | Attending: Cardiovascular Disease | Admitting: Cardiovascular Disease

## 2018-01-26 DIAGNOSIS — I214 Non-ST elevation (NSTEMI) myocardial infarction: Secondary | ICD-10-CM

## 2018-01-26 DIAGNOSIS — Z955 Presence of coronary angioplasty implant and graft: Secondary | ICD-10-CM

## 2018-01-30 ENCOUNTER — Other Ambulatory Visit: Payer: Self-pay | Admitting: Internal Medicine

## 2018-01-30 ENCOUNTER — Other Ambulatory Visit: Payer: Self-pay | Admitting: Cardiovascular Disease

## 2018-01-30 DIAGNOSIS — F329 Major depressive disorder, single episode, unspecified: Secondary | ICD-10-CM

## 2018-01-30 DIAGNOSIS — F32A Depression, unspecified: Secondary | ICD-10-CM

## 2018-01-30 NOTE — Telephone Encounter (Signed)
Rx sent to pharmacy   

## 2018-02-02 ENCOUNTER — Encounter (HOSPITAL_COMMUNITY)
Admission: RE | Admit: 2018-02-02 | Discharge: 2018-02-02 | Disposition: A | Discharge status: Home / Self Care | Payer: PPO | Source: Ambulatory Visit | Attending: Cardiovascular Disease | Admitting: Cardiovascular Disease

## 2018-02-02 DIAGNOSIS — Z955 Presence of coronary angioplasty implant and graft: Secondary | ICD-10-CM

## 2018-02-02 DIAGNOSIS — I214 Non-ST elevation (NSTEMI) myocardial infarction: Secondary | ICD-10-CM | POA: Diagnosis not present

## 2018-02-05 ENCOUNTER — Encounter (HOSPITAL_COMMUNITY)
Admission: RE | Admit: 2018-02-05 | Discharge: 2018-02-05 | Disposition: A | Discharge status: Home / Self Care | Payer: PPO | Source: Ambulatory Visit | Attending: Cardiovascular Disease | Admitting: Cardiovascular Disease

## 2018-02-05 DIAGNOSIS — I214 Non-ST elevation (NSTEMI) myocardial infarction: Secondary | ICD-10-CM | POA: Diagnosis not present

## 2018-02-05 DIAGNOSIS — I10 Essential (primary) hypertension: Secondary | ICD-10-CM | POA: Diagnosis not present

## 2018-02-05 DIAGNOSIS — Z7902 Long term (current) use of antithrombotics/antiplatelets: Secondary | ICD-10-CM | POA: Diagnosis not present

## 2018-02-05 DIAGNOSIS — K219 Gastro-esophageal reflux disease without esophagitis: Secondary | ICD-10-CM | POA: Insufficient documentation

## 2018-02-05 DIAGNOSIS — Z7982 Long term (current) use of aspirin: Secondary | ICD-10-CM | POA: Diagnosis not present

## 2018-02-05 DIAGNOSIS — F329 Major depressive disorder, single episode, unspecified: Secondary | ICD-10-CM | POA: Diagnosis not present

## 2018-02-05 DIAGNOSIS — G4731 Primary central sleep apnea: Secondary | ICD-10-CM | POA: Diagnosis not present

## 2018-02-05 DIAGNOSIS — I2510 Atherosclerotic heart disease of native coronary artery without angina pectoris: Secondary | ICD-10-CM | POA: Diagnosis not present

## 2018-02-05 DIAGNOSIS — Z79899 Other long term (current) drug therapy: Secondary | ICD-10-CM | POA: Diagnosis not present

## 2018-02-05 DIAGNOSIS — Z955 Presence of coronary angioplasty implant and graft: Secondary | ICD-10-CM | POA: Diagnosis not present

## 2018-02-05 DIAGNOSIS — E785 Hyperlipidemia, unspecified: Secondary | ICD-10-CM | POA: Insufficient documentation

## 2018-02-07 ENCOUNTER — Encounter (HOSPITAL_COMMUNITY)
Admission: RE | Admit: 2018-02-07 | Discharge: 2018-02-07 | Disposition: A | Discharge status: Home / Self Care | Payer: PPO | Source: Ambulatory Visit | Attending: Cardiovascular Disease | Admitting: Cardiovascular Disease

## 2018-02-07 DIAGNOSIS — Z955 Presence of coronary angioplasty implant and graft: Secondary | ICD-10-CM

## 2018-02-07 DIAGNOSIS — I214 Non-ST elevation (NSTEMI) myocardial infarction: Secondary | ICD-10-CM

## 2018-02-09 ENCOUNTER — Telehealth: Payer: Self-pay | Admitting: Internal Medicine

## 2018-02-09 ENCOUNTER — Encounter (HOSPITAL_COMMUNITY)
Admission: RE | Admit: 2018-02-09 | Discharge: 2018-02-09 | Disposition: A | Discharge status: Home / Self Care | Payer: PPO | Source: Ambulatory Visit | Attending: Cardiovascular Disease | Admitting: Cardiovascular Disease

## 2018-02-09 VITALS — BP 126/80 | HR 69 | Ht 72.0 in | Wt 235.0 lb

## 2018-02-09 DIAGNOSIS — I214 Non-ST elevation (NSTEMI) myocardial infarction: Secondary | ICD-10-CM

## 2018-02-09 DIAGNOSIS — Z955 Presence of coronary angioplasty implant and graft: Secondary | ICD-10-CM

## 2018-02-09 NOTE — Progress Notes (Signed)
Discharge Progress Report  Patient Details  Name: John Blackburn MRN: [id number] Date of Birth: [birth year] Referring Provider:     [place] from 10/03/2017 in [place]  Referring Provider  Dr Lorie Phenix        Number of Visits: 36  Reason for Discharge:  Patient reached a stable level of exercise.  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 S/P DES Mid RCA  NSTEMI (non-ST elevated myocardial infarction) (HCC)08/30/17  ADL [facility]:   Initial Exercise Prescription: Initial Exercise Prescription - 10/03/17 1500      Date of Initial Exercise RX and Referring Provider   Date  10/03/17    Referring Provider  Dr Lorie Phenix       NuStep   Level  1    SPM  50    Minutes  10    METs  1      Arm Ergometer   Level  1    RPM  10    Minutes  10    METs  1      Track   Laps  6    Minutes  10    METs  2.03      Prescription Details   Frequency (times per week)  3    Duration  Progress to 30 minutes of continuous aerobic without signs/symptoms of physical distress      Intensity   THRR 40-80% of Max Heartrate  60-119    Ratings of Perceived Exertion  11-15    Perceived Dyspnea  0-4      Progression   Progression  Continue to progress workloads to maintain intensity without signs/symptoms of physical distress.      Resistance Training   Training Prescription  Yes    Weight  1lb    Reps  10-15       Discharge Exercise Prescription (Final Exercise Prescription Changes): Exercise Prescription Changes - 02/09/18 1447      Response to Exercise   Blood Pressure (Admit)  126/80    Blood Pressure (Exercise)  124/70    Blood Pressure (Exit)  120/80    Heart Rate (Admit)  69 bpm    Heart Rate (Exercise)  107 bpm    Heart Rate (Exit)  68 bpm    Rating of Perceived  Exertion (Exercise)  11    Symptoms  none    Duration  Progress to 30 minutes of  aerobic without signs/symptoms of physical distress    Intensity  THRR unchanged      Progression   Progression  Continue to progress workloads to maintain intensity without signs/symptoms of physical distress.    Average METs  2.7      Resistance Training   Training Prescription  Yes    Weight  3lbs    Reps  10-15    Time  10 Minutes      Interval Training   Interval Training  No      NuStep   Level  4    SPM  70    Minutes  20    METs  2.9      Track   Laps  9    Minutes  10    METs  2.57      Home Exercise Plan   Plans to  continue exercise at  Home (comment)    Frequency  Add 2 additional days to program exercise sessions.    Initial Home Exercises Provided  12/27/17       Functional Capacity: 6 Minute Walk    Row Name 10/03/17 1441 10/03/17 1457 01/24/18 1510     6 Minute Walk   Phase  Initial  -  Discharge   Distance  897 feet  -  1526 feet   Distance % Change  -  -  70.12 %   Distance Feet Change  -  -  629 ft   Walk Time  6 minutes  -  6 minutes   # of Rest Breaks  0  -  0   MPH  1.7  -  2.89   METS  1.56  -  2.79   RPE  13  -  11   VO2 Peak  5.5  -  9.76   Symptoms  No  -  No   Resting HR  56 bpm  -  53 bpm   Resting BP  104/56  -  104/58   Resting Oxygen Saturation   96 %  -  -   Exercise Oxygen Saturation  during 6 min walk  94 % encouraged PLB, O2 increased to 96%  -  94 % encouraged PLB, O2 increased to 96%   Max Ex. HR  66 bpm  -  76 bpm   Max Ex. BP  124/74  -  122/78   2 Minute Post BP  -  104/60  104/60      Psychological, QOL, Others - Outcomes: PHQ 2/9: Depression screen [facility] 2/9 02/09/2018 10/23/2017 10/17/2017 10/03/2017 10/03/2017  Decreased Interest 0 1 1 (No Data) 0  Down, Depressed, Hopeless 0 3 3 - 0  PHQ - 2 Score 0 4 4 - 0  Altered sleeping - 0 0 - -  Tired, decreased energy - 1 1 - -  Change in appetite - 0 0 - -  Feeling bad or failure about  yourself  - 3 3 - -  Trouble concentrating - 0 0 - -  Moving slowly or fidgety/restless - 0 0 - -  Suicidal thoughts - 1 1 - -  PHQ-9 Score - 9 9 - -  Difficult doing work/chores - Somewhat difficult Somewhat difficult - -    Quality of Life: Quality of Life - 02/06/18 1543      Quality of Life Scores   Health/Function Pre  19.23 %    Health/Function Post  18.8 %    Health/Function % Change  -2.24 %    Socioeconomic Pre  18.21 %    Socioeconomic Post  19.5 %    Socioeconomic % Change   7.08 %    Psych/Spiritual Pre  25 %    Psych/Spiritual Post  22.29 %    Psych/Spiritual % Change  -10.84 %    Family Pre  19.38 %    Family Post  9.6 %    Family % Change  -50.46 %    GLOBAL Pre  20.26 %    GLOBAL Post  18.34 %    GLOBAL % Change  -9.48 %       Personal Goals: Goals established at orientation with interventions provided to work toward goal. Personal Goals and Risk Factors at Admission - 10/03/17 1457      Core Components/Risk Factors/Patient Goals on Admission    Weight Management  Yes;Obesity;Weight Maintenance  Intervention  Weight Management: Develop a combined nutrition and exercise program designed to reach desired caloric intake, while maintaining appropriate intake of nutrient and fiber, sodium and fats, and appropriate energy expenditure required for the weight goal.;Weight Management/Obesity: Establish reasonable short term and long term weight goals.;Obesity: Provide education and appropriate resources to help participant work on and attain dietary goals.;Weight Management: Provide education and appropriate resources to help participant work on and attain dietary goals.    Admit Weight  235 lb 7.2 oz (106.8 kg)    Goal Weight: Short Term  230 lb (104.3 kg)    Goal Weight: Long Term  225 lb (102.1 kg)    Expected Outcomes  Short Term: Continue to assess and modify interventions until short term weight is achieved;Long Term: Adherence to nutrition and physical  activity/exercise program aimed toward attainment of established weight goal;Weight Loss: Understanding of general recommendations for a balanced deficit meal plan, which promotes 1-2 lb weight loss per week and includes a negative energy balance of [phone] kcal/d;Weight Maintenance: Understanding of the daily nutrition guidelines, which includes 25-35% calories from fat, 7% or less cal from saturated fats, less than 257m cholesterol, less than 1.5gm of sodium, & 5 or more servings of fruits and vegetables daily;Understanding recommendations for meals to include 15-35% energy as protein, 25-35% energy from fat, 35-60% energy from carbohydrates, less than 2063mof dietary cholesterol, 20-35 gm of total fiber daily;Understanding of distribution of calorie intake throughout the day with the consumption of 4-5 meals/snacks    Hypertension  Yes    Intervention  Provide education on lifestyle modifcations including regular physical activity/exercise, weight management, moderate sodium restriction and increased consumption of fresh fruit, vegetables, and low fat dairy, alcohol moderation, and smoking cessation.;Monitor prescription use compliance.    Expected Outcomes  Short Term: Continued assessment and intervention until BP is < 140/9081mG in hypertensive participants. < 130/56m104m in hypertensive participants with diabetes, heart failure or chronic kidney disease.;Long Term: Maintenance of blood pressure at goal levels.        Personal Goals Discharge: Goals and Risk Factor Review    Row Name 10/26/17 1516 11/23/17 1523 12/21/17 1142 01/18/18 1350 02/09/18 1636     Core Components/Risk Factors/Patient Goals Review   Personal Goals Review  Weight Management/Obesity;Hypertension  Weight Management/Obesity;Hypertension  Weight Management/Obesity;Hypertension  Weight Management/Obesity;Hypertension  Weight Management/Obesity   Review  Whit's vital signs have been stable at cardiac rehab so far  Whit's  vital signs have been stable at cardiac rehab so far  Whit's vital signs have been stable at cardiac rehab. Whit is enjoying partcipating in phase 2 cardiac rehab. Whit uses a rolling walker for stability  Whit's vital signs have been stable at cardiac rehab. Whit is enjoying partcipating in phase 2 cardiac rehab. Whit uses a rolling walker for stability  Pt maintained his wt during Cardiac Rehab. Wt loss goal not met.    Expected Outcomes  Whit will continue to participate in phase 2 cardiac rehab and take his medcations as presribed  Whit will continue to participate in phase 2 cardiac rehab and take his medcations as presribed  Whit will continue to participate in phase 2 cardiac rehab and take his medcations as presribed  Whit will continue to participate in phase 2 cardiac rehab and take his medcations as presribed  -   Row [place] 02/27/18 1421             Core Components/Risk Factors/Patient Goals Review   Personal Goals Review  Weight Management/Obesity;Hypertension       Review  Whit has maintained his current weight and has not lost any weight during his participation in the program. Tevon's vital signs have been stable.       Expected Outcomes  Dr Sharlet Salina is referring Mr Czerniak to the [facility] program for balance retraining. Whit has also been given information about the [facility]          Exercise Goals and Review: Exercise Goals    Row Name 10/03/17 1357             Exercise Goals   Increase Physical Activity  Yes       Intervention  Provide advice, education, support and counseling about physical activity/exercise needs.;Develop an individualized exercise prescription for aerobic and resistive training based on initial evaluation findings, risk stratification, comorbidities and participant's personal goals.       Expected Outcomes  Short Term: Attend rehab on a regular basis to increase amount of physical activity.;Long Term: Exercising regularly at least 3-5 days a  week.;Long Term: Add in home exercise to make exercise part of routine and to increase amount of physical activity.       Increase Strength and Stamina  Yes be able to walk 1 mile       Intervention  Provide advice, education, support and counseling about physical activity/exercise needs.;Develop an individualized exercise prescription for aerobic and resistive training based on initial evaluation findings, risk stratification, comorbidities and participant's personal goals.       Expected Outcomes  Short Term: Increase workloads from initial exercise prescription for resistance, speed, and METs.;Short Term: Perform resistance training exercises routinely during rehab and add in resistance training at home;Long Term: Improve cardiorespiratory fitness, muscular endurance and strength as measured by increased METs and functional capacity (6MWT)       Able to understand and use rate of perceived exertion (RPE) scale  Yes       Intervention  Provide education and explanation on how to use RPE scale       Expected Outcomes  Short Term: Able to use RPE daily in rehab to express subjective intensity level;Long Term:  Able to use RPE to guide intensity level when exercising independently       Knowledge and understanding of Target Heart Rate Range (THRR)  Yes       Intervention  Provide education and explanation of THRR including how the numbers were predicted and where they are located for reference       Expected Outcomes  Short Term: Able to state/look up THRR;Long Term: Able to use THRR to govern intensity when exercising independently;Short Term: Able to use daily as guideline for intensity in rehab       Able to check pulse independently  Yes       Intervention  Provide education and demonstration on how to check pulse in carotid and radial arteries.;Review the importance of being able to check your own pulse for safety during independent exercise       Expected Outcomes  Short Term: Able to explain why  pulse checking is important during independent exercise;Long Term: Able to check pulse independently and accurately       Understanding of Exercise Prescription  Yes       Intervention  Provide education, explanation, and written materials on patient's individual exercise prescription       Expected Outcomes  Short Term: Able to explain program exercise prescription;Long Term: Able to explain home  exercise prescription to exercise independently          Nutrition & Weight - Outcomes: Pre Biometrics - 10/03/17 1442      Pre Biometrics   Height  6' (1.829 m)    Weight  235 lb 7.2 oz (106.8 kg)    Waist Circumference  42.5 inches    Hip Circumference  44.5 inches    Waist to Hip Ratio  0.96 %    BMI (Calculated)  31.93    Triceps Skinfold  18 mm    % Body Fat  30.7 %    Grip Strength  27 kg    Flexibility  9 in    Single Leg Stand  1 seconds      Post Biometrics - 02/09/18 1447       Post  Biometrics   Height  6' (1.829 m)    Weight  235 lb 0.2 oz (106.6 kg)    Waist Circumference  41.75 inches    Hip Circumference  44 inches    Waist to Hip Ratio  0.95 %    BMI (Calculated)  31.87    Triceps Skinfold  17 mm    % Body Fat  30 %    Grip Strength  32 kg    Flexibility  13 in    Single Leg Stand  1.97 seconds       Nutrition: Nutrition Therapy & Goals - 10/04/17 1157      Nutrition Therapy   Diet  Heart Healthy      Intervention Plan   Intervention  Prescribe, educate and counsel regarding individualized specific dietary modifications aiming towards targeted core components such as weight, hypertension, lipid management, diabetes, heart failure and other comorbidities.    Expected Outcomes  Short Term Goal: Understand basic principles of dietary content, such as calories, fat, sodium, cholesterol and nutrients.;Long Term Goal: Adherence to prescribed nutrition plan.       Nutrition Discharge: Nutrition Assessments - 11/06/17 1545      MEDFICTS Scores   Pre Score  48        Education Questionnaire Score: Knowledge Questionnaire Score - 02/06/18 1543      Knowledge Questionnaire Score   Pre Score  21/24    Post Score  19/24       Goals reviewed with patient; copy given to patient.Whit graduated from cardiac rehab program today with completion of 36 exercise sessions in Phase II. Pt maintained good attendance and progressed nicely during his participation in rehab as evidenced by increased MET level.   Medication list reconciled. Repeat  PHQ score- 0 . Whit did report feeling depressed prior to starting the program and felt less depressed and the end of the program. Pt has made significant lifestyle changes and should be commended for his success. Pt feels he has achieved his goals during cardiac rehab. Whit significantly increased his distance on his post exercise walk test.    Pt will be referred to the [facility] program for balance retraining via Dr Marko Plume office. Whit was also given information for the [facility]. Whit used a walker while walking on the track. We are proud of Whit's progress and hope that he stays active. Whit said participating in phase 2 cardiac rehab helped his depression.Barnet Pall, RN,BSN 02/27/2018 2:28 PM

## 2018-02-09 NOTE — Telephone Encounter (Signed)
Copied from [place] [id number] [id number]. Topic: Inquiry >> Feb 09, 2018  4:27 PM Oliver Pila B wrote: Reason for CRM: [facility] was just following up w/ Dr. Sharlet Salina to have the pt put in the [facility] program, have pt contacted or get in touch w/ the Cardiac rehab dept for any questions

## 2018-02-14 DIAGNOSIS — H04123 Dry eye syndrome of bilateral lacrimal glands: Secondary | ICD-10-CM | POA: Diagnosis not present

## 2018-02-14 DIAGNOSIS — H2513 Age-related nuclear cataract, bilateral: Secondary | ICD-10-CM | POA: Diagnosis not present

## 2018-02-14 DIAGNOSIS — H01022 Squamous blepharitis right lower eyelid: Secondary | ICD-10-CM | POA: Diagnosis not present

## 2018-02-14 DIAGNOSIS — H01025 Squamous blepharitis left lower eyelid: Secondary | ICD-10-CM | POA: Diagnosis not present

## 2018-02-14 DIAGNOSIS — H01024 Squamous blepharitis left upper eyelid: Secondary | ICD-10-CM | POA: Diagnosis not present

## 2018-02-14 DIAGNOSIS — H01021 Squamous blepharitis right upper eyelid: Secondary | ICD-10-CM | POA: Diagnosis not present

## 2018-02-14 NOTE — Telephone Encounter (Signed)
Already being taken care of by Dr. Sharlet Salina

## 2018-02-20 ENCOUNTER — Ambulatory Visit: Payer: Self-pay | Admitting: *Deleted

## 2018-02-20 ENCOUNTER — Encounter: Payer: Self-pay | Admitting: *Deleted

## 2018-02-20 NOTE — Telephone Encounter (Signed)
Pt states she has experienced left foot swelling for the past week. Pt states that foot is "swollen a lot" and the right foot does have some swelling but not as much as the left foot. Pt states that swelling is only in the foot and does not extend to the knee and denies any redness or pain to the foot. Pt states he has experienced swelling to the foot about every 6 months and it does not occur that often usually does not last for more than one day. Pt offered appt with Dr. Sharlet Salina on Friday, but the pt did not want to wait that long to be seen and requested to be seen by another provider. Pt scheduled for appt on 02/20/18. Pt advised if symptoms become worse before seen for office visit to seek care in the ED or Urgent Care. Pt verbalized understanding.   Reason for Disposition . [1] MILD swelling of both ankles (i.e., pedal edema) AND [2] new onset or worsening  Answer Assessment - Initial Assessment Questions 1. ONSET: "When did the swelling start?" (e.g., minutes, hours, days)     For the past week 2. LOCATION: "What part of the leg is swollen?"  "Are both legs swollen or just one leg?"     Left foot is more swollen than the right foot 3. SEVERITY: "How bad is the swelling?" (e.g., localized; mild, moderate, severe)  - Localized - small area of swelling localized to one leg  - MILD pedal edema - swelling limited to foot and ankle, pitting edema < 1/4 inch (6 mm) deep, rest and elevation eliminate most or all swelling  - MODERATE edema - swelling of lower leg to knee, pitting edema > 1/4 inch (6 mm) deep, rest and elevation only partially reduce swelling  - SEVERE edema - swelling extends above knee, facial or hand swelling present      Just feels tight, pt states that it is swollen a lot not just a little bit, swelling only in foot 4. REDNESS: "Does the swelling look red or infected?"     No redness 5. PAIN: "Is the swelling painful to touch?" If so, ask: "How painful is it?"   (Scale 1-10;  mild, moderate or severe)     Pt denies pain 6. FEVER: "Do you have a fever?" If so, ask: "What is it, how was it measured, and when did it start?"      no 7. CAUSE: "What do you think is causing the leg swelling?"     unknown 8. MEDICAL HISTORY: "Do you have a history of heart failure, kidney disease, liver failure, or cancer?"     Hx of heart attack in december 9. RECURRENT SYMPTOM: "Have you had leg swelling before?" If so, ask: "When was the last time?" "What happened that time?"     Pt states it may come every 6 months but does not happen very often 10. OTHER SYMPTOMS: "Do you have any other symptoms?" (e.g., chest pain, difficulty breathing)       no  Protocols used: LEG SWELLING AND EDEMA-A-AH

## 2018-02-20 NOTE — Telephone Encounter (Signed)
This encounter was created in error - please disregard.

## 2018-02-21 ENCOUNTER — Ambulatory Visit: Payer: PPO | Admitting: Family

## 2018-02-22 ENCOUNTER — Encounter: Payer: Self-pay | Admitting: Family

## 2018-02-22 ENCOUNTER — Ambulatory Visit (INDEPENDENT_AMBULATORY_CARE_PROVIDER_SITE_OTHER): Payer: PPO | Admitting: Family

## 2018-02-22 VITALS — BP 148/82 | HR 72 | Temp 97.8°F | Ht 72.0 in | Wt 237.2 lb

## 2018-02-22 DIAGNOSIS — R600 Localized edema: Secondary | ICD-10-CM | POA: Diagnosis not present

## 2018-02-22 DIAGNOSIS — R197 Diarrhea, unspecified: Secondary | ICD-10-CM | POA: Diagnosis not present

## 2018-02-22 DIAGNOSIS — R413 Other amnesia: Secondary | ICD-10-CM | POA: Diagnosis not present

## 2018-02-22 NOTE — Progress Notes (Signed)
John Blackburn is a 72 y.o. male with the following history as recorded in EpicCare:  Patient Active Problem List   Diagnosis Date Noted  . Memory loss 11/28/2017  . Balance problem 11/28/2017  . skin cancer   . Sleep apnea, primary central   . GERD (gastroesophageal reflux disease)   . MDD (major depressive disorder), single episode, moderate ([place])   . CAD S/P percutaneous coronary angioplasty 09/02/2017  . Acute congestive heart failure ([place]) 08/30/2017  . Elevated blood sugar 01/17/2017  . Incontinence of feces   . Allergic rhinitis 10/15/2013  . Chronic venous insufficiency 07/20/2013  . History of pulmonary embolism 07/08/2013  . ED (erectile dysfunction) 06/01/2011  . Arthritis   . Hypertension   . Hyperlipidemia   . Obstructive sleep apnea     Current Outpatient Medications  Medication Sig Dispense Refill  . aspirin EC 81 MG EC tablet Take 1 tablet (81 mg total) by mouth daily.    [name] atenolol-chlorthalidone (TENORETIC) 50-25 MG tablet TAKE ONE TABLET BY MOUTH ONCE DAILY 90 tablet 3  . atorvastatin (LIPITOR) 80 MG tablet Take 1 tablet (80 mg total) by mouth daily at 6 PM. 90 tablet 1  . buPROPion (WELLBUTRIN XL) 300 MG 24 hr tablet TAKE 1 TABLET BY MOUTH ONCE DAILY 30 tablet 2  . clopidogrel (PLAVIX) 75 MG tablet Take 1 tablet (75 mg total) by mouth daily. 90 tablet 2  . furosemide (LASIX) 40 MG tablet TAKE 1 TABLET BY MOUTH ONCE DAILY 30 tablet 3  . loratadine (CLARITIN) 10 MG tablet Take 10 mg by mouth daily.    . Melatonin 1 MG/ML LIQD Take 2.5 mg by mouth at bedtime.     . nitroGLYCERIN (NITROSTAT) 0.4 MG SL tablet Place 1 tablet (0.4 mg total) under the tongue every 5 (five) minutes as needed. 25 tablet 2  . Oxymetazoline HCl (VICKS SINEX MOISTURIZING NA) Place 1 spray into the nose daily as needed (congestion).    . pantoprazole (PROTONIX) 40 MG tablet Take 1 tablet (40 mg total) by mouth daily. Please call to make appointment for further refills. 30 tablet 1  .  potassium chloride SA (K-DUR,KLOR-CON) 20 MEQ tablet Take 2 tablets (40 mEq total) by mouth daily. 60 tablet 3  . sertraline (ZOLOFT) 100 MG tablet Take 1 tablet (100 mg total) by mouth daily. 30 tablet 3  . tetrahydrozoline 0.05 % ophthalmic solution Place 1 drop into both eyes daily as needed (dry eyes).    . Turmeric 500 MG CAPS Take by mouth.    . Vitamin D, Ergocalciferol, (DRISDOL) 50000 units CAPS capsule Take 1 capsule (50,000 Units total) by mouth every 7 (seven) days. 12 capsule 0   No current facility-administered medications for this visit.     Allergies: Cephalexin and Levofloxacin  Past Medical History:  Diagnosis Date  . Allergy   . 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 CAD (coronary artery disease)    08/2017 PCI/DES mRCA, RPDA, CTO pf pLCX with collaterals, normal EF  . Depression    on multiple meds. Has been seen at [facility]. and Dr. Sabra Heck is his prescriber  . Diverticulitis of colon 2000 's   treated as an outpatient  . GERD (gastroesophageal reflux disease)    UGI done August '12 - ulcer/. Dr. Ferdinand Lango, gastroenterologist in [facility].   [name] Hx of adenomatous colonic polyps   . Hyperlipidemia    last lipid panel: HDL 44, LDL 117  .  Hypertension   . Peptic ulcer    in the past and just recently-August '12  . Pulmonary emboli [facility])    noted October 2014 - treated by Dr. Linda Hedges  . skin cancer    skin CA  . Sleep apnea, primary central    wears CPAP    Past Surgical History:  Procedure Laterality Date  . ANAL RECTAL MANOMETRY N/A 11/30/2016   Procedure: ANO RECTAL MANOMETRY;  Surgeon: Mauri Pole, MD;  Location: [facility];  Service: Endoscopy;  Laterality: N/A;  . CARDIAC CATHETERIZATION    . CHOLECYSTECTOMY  1990   laproscopic   . CORONARY STENT INTERVENTION N/A 09/01/2017   Procedure: CORONARY STENT INTERVENTION;  Surgeon: Jettie Booze, MD;  Location: [facility];  Service: Cardiovascular;  Laterality: N/A;  .  HERNIA REPAIR    . JOINT REPLACEMENT     knee  . PROSTATE SURGERY     needle biopsy's, TURP  . RIGHT/LEFT HEART CATH AND CORONARY ANGIOGRAPHY N/A 09/01/2017   Procedure: RIGHT/LEFT HEART CATH AND CORONARY ANGIOGRAPHY;  Surgeon: Jettie Booze, MD;  Location: [place];  Service: Cardiovascular;  Laterality: N/A;  . TKR bilateral  2006   Dr. Violet Baldy  . UPPER GASTROINTESTINAL ENDOSCOPY    . VASECTOMY      Family History  Problem Relation Age of Onset  . Stroke Mother   . Hypertension Mother   . Heart disease Mother   . Heart disease Father        CAD/MI-fatal sudden death  . Colon cancer Father   . Colon cancer Sister        colon cancer [id number]  . Heart disease Paternal Uncle   . Colon cancer Paternal Uncle   . Heart disease Paternal Grandfather   . Colon cancer Paternal Aunt   . Colon cancer Cousin   . Colon cancer Cousin   . Colon cancer Paternal Uncle   . Stomach cancer Neg Hx   . Rectal cancer Neg Hx   . Esophageal cancer Neg Hx     Social History   Tobacco Use  . Smoking status: Never Smoker  . Smokeless tobacco: Never Used  Substance Use Topics  . Alcohol use: Yes    Alcohol/week: 0.6 oz    Types: 1 Standard drinks or equivalent per week    Comment: social    Subjective:  Patient presents with concerns for swelling in his left foot x 1 week; denies any pain or swelling in his left lower leg; no recent travel; Denies any chest pain, shortness of breath, cough/ wheezing; patient appears to have memory issues- is very confused about his medications; is unsure if he is actually taking his Lasix everyday;   Also mentions concerns about diarrhea x 1 week; admits that stress level is extremely high; no blood in the stool; denies any abdominal pain; feels like food is "just running through me." Has gotten some relief with OTC Pepto Bismol;    Objective:  Vitals:   02/22/18 1355  BP: (!) 148/82  Pulse: 72  Temp: 97.8 F (36.6 C)   TempSrc: Oral  SpO2: 96%  Weight: 237 lb 3.2 oz (107.6 kg)  Height: 6' (1.829 m)    General: Well developed, well nourished, in no acute distress  Skin : Warm and dry.  Head: Normocephalic and atraumatic  Lungs: Respirations unlabored; clear to auscultation bilaterally without wheeze, rales, rhonchi  CVS exam: normal rate and regular rhythm.  Abdomen: Soft; nontender;  nondistended; normoactive bowel sounds; no masses or hepatosplenomegaly  Musculoskeletal: No deformities; no active joint inflammation; no pain or swelling in left calf  Extremities: bilateral pedal edema ( L >R)), no cyanosis, no clubbing  Vessels: Symmetric bilaterally  Neurologic: Alert and oriented; speech intact; face symmetrical; moves all extremities well; CNII-XII intact without focal deficit  Assessment:  1. Pedal edema   2. Diarrhea, unspecified type   3. Memory loss     Plan:  1. ? If patient is actually taking his Lasix everyday; he is very confused about his medication; he understands to take Lasix bid until swelling resolves and then resume taking daily; if no improvement in 24-48 hours, call back and will get venous doppler 2. ? Stress induced; BRAT diet discussed; told patient he could try occasional Immodium as well; follow-up if not resolved within the next few days.  3. He was due to see his PCP in follow-up at the end of May- encouraged to schedule appointment.   Return in about 2 weeks (around 03/08/2018) for with Dr. Sharlet Salina.  No orders of the defined types were placed in this encounter.   Requested Prescriptions    No prescriptions requested or ordered in this encounter

## 2018-02-22 NOTE — Patient Instructions (Signed)
Make sure you are taking your Lasix once a day as prescribed; if you have been doing this, you can increase to twice a day x 3-4 days until the swelling resolves; make sure to take your potassium;   Food Choices to Help Relieve Diarrhea, Adult When you have diarrhea, the foods you eat and your eating habits are very important. Choosing the right foods and drinks can help:  Relieve diarrhea.  Replace lost fluids and nutrients.  Prevent dehydration.  What general guidelines should I follow? Relieving diarrhea  Choose foods with less than 2 g or .07 oz. of fiber per serving.  Limit fats to less than 8 tsp (38 g or 1.34 oz.) a day.  Avoid the following: ? Foods and beverages sweetened with high-fructose corn syrup, honey, or sugar alcohols such as xylitol, sorbitol, and mannitol. ? Foods that contain a lot of fat or sugar. ? Fried, greasy, or spicy foods. ? High-fiber grains, breads, and cereals. ? Raw fruits and vegetables.  Eat foods that are rich in probiotics. These foods include dairy products such as yogurt and fermented milk products. They help increase healthy bacteria in the stomach and intestines (gastrointestinal tract, or GI tract).  If you have lactose intolerance, avoid dairy products. These may make your diarrhea worse.  Take medicine to help stop diarrhea (antidiarrheal medicine) only as told by your health care provider. Replacing nutrients  Eat small meals or snacks every 3-4 hours.  Eat bland foods, such as white rice, toast, or baked potato, until your diarrhea starts to get better. Gradually reintroduce nutrient-rich foods as tolerated or as told by your health care provider. This includes: ? Well-cooked protein foods. ? Peeled, seeded, and soft-cooked fruits and vegetables. ? Low-fat dairy products.  Take vitamin and mineral supplements as told by your health care provider. Preventing dehydration   Start by sipping water or a special solution to prevent  dehydration (oral rehydration solution, ORS). Urine that is clear or pale yellow means that you are getting enough fluid.  Try to drink at least 8-10 cups of fluid each day to help replace lost fluids.  You may add other liquids in addition to water, such as clear juice or decaffeinated sports drinks, as tolerated or as told by your health care provider.  Avoid drinks with caffeine, such as coffee, tea, or soft drinks.  Avoid alcohol. What foods are recommended? The items listed may not be a complete list. Talk with your health care provider about what dietary choices are best for you. Grains White rice. White, [place], or pita breads (fresh or toasted), including plain rolls, buns, or bagels. White pasta. Saltine, soda, or graham crackers. Pretzels. Low-fiber cereal. Cooked cereals made with water (such as cornmeal, farina, or cream cereals). Plain muffins. Matzo. Melba toast. Zwieback. Vegetables Potatoes (without the skin). Most well-cooked and canned vegetables without skins or seeds. Tender lettuce. Fruits Apple sauce. Fruits canned in juice. Cooked apricots, cherries, grapefruit, peaches, pears, or plums. Fresh bananas and cantaloupe. Meats and other protein foods Baked or boiled chicken. Eggs. Tofu. Fish. Seafood. Smooth nut butters. Ground or well-cooked tender beef, ham, veal, lamb, pork, or poultry. Dairy Plain yogurt, kefir, and unsweetened liquid yogurt. Lactose-free milk, buttermilk, skim milk, or soy milk. Low-fat or nonfat hard cheese. Beverages Water. Low-calorie sports drinks. Fruit juices without pulp. Strained tomato and vegetable juices. Decaffeinated teas. Sugar-free beverages not sweetened with sugar alcohols. Oral rehydration solutions, if approved by your health care provider. Seasoning and other foods Bouillon,  broth, or soups made from recommended foods. What foods are not recommended? The items listed may not be a complete list. Talk with your health care provider  about what dietary choices are best for you. Grains Whole grain, whole wheat, bran, or rye breads, rolls, pastas, and crackers. Wild or brown rice. Whole grain or bran cereals. Barley. Oats and oatmeal. Corn tortillas or taco shells. Granola. Popcorn. Vegetables Raw vegetables. Fried vegetables. Cabbage, broccoli, Brussels sprouts, artichokes, baked beans, beet greens, corn, kale, legumes, peas, sweet potatoes, and yams. Potato skins. Cooked spinach and cabbage. Fruits Dried fruit, including raisins and dates. Raw fruits. Stewed or dried prunes. Canned fruits with syrup. Meat and other protein foods Fried or fatty meats. Deli meats. Chunky nut butters. Nuts and seeds. Beans and lentils. Berniece Salines. Hot dogs. Sausage. Dairy High-fat cheeses. Whole milk, chocolate milk, and beverages made with milk, such as milk shakes. Half-and-half. Cream. sour cream. Ice cream. Beverages Caffeinated beverages (such as coffee, tea, soda, or energy drinks). Alcoholic beverages. Fruit juices with pulp. Prune juice. Soft drinks sweetened with high-fructose corn syrup or sugar alcohols. High-calorie sports drinks. Fats and oils Butter. Cream sauces. Margarine. Salad oils. Plain salad dressings. Olives. Avocados. Mayonnaise. Sweets and desserts Sweet rolls, doughnuts, and sweet breads. Sugar-free desserts sweetened with sugar alcohols such as xylitol and sorbitol. Seasoning and other foods Honey. Hot sauce. Chili powder. Gravy. Cream-based or milk-based soups. Pancakes and waffles. Summary  When you have diarrhea, the foods you eat and your eating habits are very important.  Make sure you get at least 8-10 cups of fluid each day, or enough to keep your urine clear or pale yellow.  Eat bland foods and gradually reintroduce healthy, nutrient-rich foods as tolerated, or as told by your health care provider.  Avoid high-fiber, fried, greasy, or spicy foods. This information is not intended to replace advice given to  you by your health care provider. Make sure you discuss any questions you have with your health care provider. Document Released: 11/12/2003 Document Revised: 08/19/2016 Document Reviewed: 08/19/2016 Elsevier Interactive Patient Education  [employer].

## 2018-02-25 DIAGNOSIS — G4733 Obstructive sleep apnea (adult) (pediatric): Secondary | ICD-10-CM | POA: Diagnosis not present

## 2018-03-09 ENCOUNTER — Ambulatory Visit (INDEPENDENT_AMBULATORY_CARE_PROVIDER_SITE_OTHER): Payer: PPO | Admitting: Internal Medicine

## 2018-03-09 ENCOUNTER — Encounter: Payer: Self-pay | Admitting: Internal Medicine

## 2018-03-09 ENCOUNTER — Ambulatory Visit: Payer: PPO | Admitting: Internal Medicine

## 2018-03-09 VITALS — BP 100/60 | HR 61 | Temp 97.9°F | Ht 72.0 in | Wt 234.0 lb

## 2018-03-09 DIAGNOSIS — I872 Venous insufficiency (chronic) (peripheral): Secondary | ICD-10-CM

## 2018-03-09 DIAGNOSIS — R413 Other amnesia: Secondary | ICD-10-CM | POA: Diagnosis not present

## 2018-03-09 DIAGNOSIS — F321 Major depressive disorder, single episode, moderate: Secondary | ICD-10-CM

## 2018-03-09 MED ORDER — FUROSEMIDE 40 MG PO TABS: 40.0000 mg | ORAL_TABLET | Freq: Every day | ORAL | 3 refills | Status: DC

## 2018-03-09 NOTE — Assessment & Plan Note (Signed)
Refer to neurology for neuropsych testing. He does still have moderate depression symptoms which I believe are impacting on his memory.

## 2018-03-09 NOTE — Progress Notes (Signed)
   Subjective:    Patient ID: John Blackburn, male    DOB: 12-22-1945, 72 y.o.   MRN: [id number]  HPI The patient is a 72 YO man coming in for several concerns including left foot swelling (is not sure if he is taking lasix, does take his blood pressure medicine atenolol chlorthalidone daily, just left foot, no problems with shoes fitting, denies recent injury or falls, denies skin color changes or rash) and depression (increased zoloft to 100 mg daily, he is not sure he made that change, still taking wellbutrin 300 mg daily as well, thinks this is helping some more, he is still struggling with divorce several years ago, denies SI/HI, is not sure he is all the way okay yet) and memory changes (still feels like he is having some changes to memory, less since we increased his zoloft, denies losing car or getting lost or problems with bills, does have some problems with memory in the house or when reading an article).   Review of Systems  Constitutional: Negative.   HENT: Negative.   Eyes: Negative.   Respiratory: Negative for cough, chest tightness and shortness of breath.   Cardiovascular: Positive for leg swelling. Negative for chest pain and palpitations.  Gastrointestinal: Negative for abdominal distention, abdominal pain, constipation, diarrhea, nausea and vomiting.  Musculoskeletal: Negative.   Skin: Negative.   Neurological: Negative.        Memory change  Psychiatric/Behavioral: Positive for decreased concentration. Negative for agitation, behavioral problems, dysphoric mood, self-injury, sleep disturbance and suicidal ideas. The patient is not nervous/anxious.       Objective:   Physical Exam  Constitutional: He is oriented to person, place, and time. He appears well-developed and well-nourished.  HENT:  Head: Normocephalic and atraumatic.  Eyes: EOM are normal.  Neck: Normal range of motion.  Cardiovascular: Normal rate and regular rhythm.  Pulmonary/Chest: Effort normal and  breath sounds normal. No respiratory distress. He has no wheezes. He has no rales.  Abdominal: Soft. Bowel sounds are normal. He exhibits no distension. There is no tenderness. There is no rebound.  Musculoskeletal: He exhibits edema.  1-2+ edema in the left foot, no skin color changes, PT and DP intact bilateral feet  Neurological: He is alert and oriented to person, place, and time. Coordination normal.  Some problems with focus but memory is intact with focus  Skin: Skin is warm and dry.  Psychiatric: He has a normal mood and affect.   Vitals:   03/09/18 1526  BP: 100/60  Pulse: 61  Temp: 97.9 F (36.6 C)  TempSrc: Oral  SpO2: 97%  Weight: 234 lb (106.1 kg)  Height: 6' (1.829 m)      Assessment & Plan:

## 2018-03-09 NOTE — Assessment & Plan Note (Signed)
Refill lasix and asked him to verify he is taking. He could not tell me if he was taking this medication or not. We also talked about elevating the leg to help with swelling. No signs of DVT on exam today.

## 2018-03-09 NOTE — Assessment & Plan Note (Signed)
PHQ-9 is worse than prior. He does not have confidence that he made the change to zoloft but feels some improved verbally. Will continue zoloft 100 mg and wellbutrin 300 mg daily and have him verify he is taking this. If neuropsych testing indicates memory is impacted by depression we will ask him to return to psych which he has seen in the past.

## 2018-03-09 NOTE — Patient Instructions (Signed)
We have sent in a refill of furosemide (also called lasix) which is the fluid pill that you should take 1 pill daily.   We will get you in with the specialist to help [place] decide if your memory problems are coming from the memory or depression.   Work on elevating the leg to help with the swelling as well.

## 2018-03-17 ENCOUNTER — Other Ambulatory Visit: Payer: Self-pay | Admitting: Internal Medicine

## 2018-03-19 ENCOUNTER — Encounter: Payer: Self-pay | Admitting: Psychology

## 2018-03-27 DIAGNOSIS — G4733 Obstructive sleep apnea (adult) (pediatric): Secondary | ICD-10-CM | POA: Diagnosis not present

## 2018-03-29 ENCOUNTER — Ambulatory Visit: Payer: PPO | Admitting: Internal Medicine

## 2018-03-29 DIAGNOSIS — Z0289 Encounter for other administrative examinations: Secondary | ICD-10-CM

## 2018-03-30 ENCOUNTER — Ambulatory Visit (INDEPENDENT_AMBULATORY_CARE_PROVIDER_SITE_OTHER): Payer: PPO | Admitting: Internal Medicine

## 2018-03-30 ENCOUNTER — Other Ambulatory Visit (INDEPENDENT_AMBULATORY_CARE_PROVIDER_SITE_OTHER): Payer: PPO

## 2018-03-30 ENCOUNTER — Encounter: Payer: Self-pay | Admitting: Internal Medicine

## 2018-03-30 DIAGNOSIS — R413 Other amnesia: Secondary | ICD-10-CM | POA: Diagnosis not present

## 2018-03-30 DIAGNOSIS — F321 Major depressive disorder, single episode, moderate: Secondary | ICD-10-CM

## 2018-03-30 DIAGNOSIS — G4731 Primary central sleep apnea: Secondary | ICD-10-CM

## 2018-03-30 LAB — COMPREHENSIVE METABOLIC PANEL
ALBUMIN: 4.3 g/dL (ref 3.5–5.2)
ALK PHOS: 82 U/L (ref 39–117)
ALT: 30 U/L (ref 0–53)
AST: 31 U/L (ref 0–37)
BILIRUBIN TOTAL: 1.4 mg/dL — AB (ref 0.2–1.2)
BUN: 18 mg/dL (ref 6–23)
CO2: 35 mEq/L — ABNORMAL HIGH (ref 19–32)
Calcium: 9.6 mg/dL (ref 8.4–10.5)
Chloride: 97 mEq/L (ref 96–112)
Creatinine, Ser: 1.29 mg/dL (ref 0.40–1.50)
GFR: 58.18 mL/min — ABNORMAL LOW (ref >60.00)
GLUCOSE: 117 mg/dL — AB (ref 70–99)
POTASSIUM: 2.9 meq/L — AB (ref 3.5–5.1)
SODIUM: 141 meq/L (ref 135–145)
TOTAL PROTEIN: 7.7 g/dL (ref 6.0–8.3)

## 2018-03-30 LAB — CBC
HEMATOCRIT: 42.2 % (ref 39.0–52.0)
HEMOGLOBIN: 14.7 g/dL (ref 13.0–17.0)
MCHC: 34.9 g/dL (ref 30.0–36.0)
MCV: 96.9 fl (ref 78.0–100.0)
Platelets: 199 10*3/uL (ref 150.0–400.0)
RBC: 4.35 Mil/uL (ref 4.22–5.81)
RDW: 13.6 % (ref 11.5–15.5)
WBC: 8.8 10*3/uL (ref 4.0–10.5)

## 2018-03-30 MED ORDER — ESCITALOPRAM OXALATE 10 MG PO TABS: 10.0000 mg | ORAL_TABLET | Freq: Every day | ORAL | 6 refills | Status: DC

## 2018-03-30 MED ORDER — DONEPEZIL HCL 10 MG PO TABS: 10.0000 mg | ORAL_TABLET | Freq: Every day | ORAL | 6 refills | Status: DC

## 2018-03-30 NOTE — Patient Instructions (Addendum)
We are going to get a CT scan of the head.   We are checking the labs today.  We will get you in with the neurologist.   Stop taking the zoloft (sertraline).   We have sent in lexapro instead to take. Keep taking wellbutrin as well.   We have sent in aricept to take 1 pill daily for the memory.

## 2018-03-30 NOTE — Assessment & Plan Note (Signed)
Will change zoloft to lexapro. Keep wellbutrin.

## 2018-03-30 NOTE — Assessment & Plan Note (Signed)
Will start aricept given the acute changes. Checking CT head to rule out recent stroke given the acute change. Checking labs to rule out metabolic cause. Referral to neurology to get him in since he cannot do neuropsych testing until December. Will make adjustments to his medications for better control of depression but with this added history underlying memory problems are becoming more likely.

## 2018-03-30 NOTE — Progress Notes (Signed)
   Subjective:    Patient ID: John Blackburn, male    DOB: 04-02-1946, 72 y.o.   MRN: [id number]  HPI The patient is a 72 YO man coming in for concerns about his memory. His 2 sons are present with him today and are very concerned. The patient and I had talked about his memory and he felt like things were worse with going into rooms and losing track of why he is in there. He has chronic depression and this has not been well controlled lately. His sons add that about 2 months ago his memory dramatically worsened. Depression to them seems fairly stable for the last decade but present. They have noticed him getting lost going to a family reunion as well as a funeral recently. He is walking into rooms and forgetting. No bills lapsed. They are not sure he is taking his medications correctly. He has stopped wearing his CPAP and is not sure when that has been. He states a week but yesterday told his sons he had no idea when he stopped. They think at least 1 month.   Review of Systems  Constitutional: Negative.   HENT: Negative.   Eyes: Negative.   Respiratory: Negative for cough, chest tightness and shortness of breath.   Cardiovascular: Negative for chest pain, palpitations and leg swelling.  Gastrointestinal: Negative for abdominal distention, abdominal pain, constipation, diarrhea, nausea and vomiting.  Musculoskeletal: Negative.   Skin: Negative.   Neurological: Negative.        Memory change  Psychiatric/Behavioral: Negative.       Objective:   Physical Exam  Constitutional: He is oriented to person, place, and time. He appears well-developed and well-nourished.  HENT:  Head: Normocephalic and atraumatic.  Eyes: EOM are normal.  Neck: Normal range of motion.  Cardiovascular: Normal rate and regular rhythm.  Pulmonary/Chest: Effort normal and breath sounds normal. No respiratory distress. He has no wheezes. He has no rales.  Abdominal: Soft. Bowel sounds are normal. He exhibits no  distension. There is no tenderness. There is no rebound.  Musculoskeletal: He exhibits no edema.  Neurological: He is alert and oriented to person, place, and time. Coordination normal.  Somewhat disconnected during the visit and answered questions only when directly directed at him and with few word answers  Skin: Skin is warm and dry.  Psychiatric:  Flat affect   Vitals:   03/30/18 1604  BP: 100/64  Pulse: (!) 54  Temp: 97.7 F (36.5 C)  TempSrc: Oral  SpO2: 96%  Weight: 224 lb (101.6 kg)  Height: 6' (1.829 m)      Assessment & Plan:

## 2018-03-30 NOTE — Assessment & Plan Note (Signed)
Not using CPAP recently which could cause some more changes during the day. He is reminded about the importance and will start wearing this again.

## 2018-04-02 LAB — TSH: TSH: 0.46 u[IU]/mL (ref 0.35–4.50)

## 2018-04-02 LAB — VITAMIN B12: Vitamin B-12: 210 pg/mL — ABNORMAL LOW (ref 211–911)

## 2018-04-02 LAB — RPR: RPR Ser Ql: NONREACTIVE

## 2018-04-03 ENCOUNTER — Ambulatory Visit (INDEPENDENT_AMBULATORY_CARE_PROVIDER_SITE_OTHER): Payer: PPO | Admitting: *Deleted

## 2018-04-03 DIAGNOSIS — E538 Deficiency of other specified B group vitamins: Secondary | ICD-10-CM | POA: Diagnosis not present

## 2018-04-03 MED ORDER — CYANOCOBALAMIN 1000 MCG/ML IJ SOLN
1000.0000 ug | Freq: Once | INTRAMUSCULAR | Status: AC
  Administered 2018-04-03: 1000 ug via INTRAMUSCULAR

## 2018-04-03 NOTE — Progress Notes (Signed)
Medical treatment/procedure(s) were performed by non-physician practitioner and as supervising physician I was immediately available for consultation/collaboration. I agree with above. Elizabeth A Crawford, MD  

## 2018-04-06 ENCOUNTER — Ambulatory Visit (INDEPENDENT_AMBULATORY_CARE_PROVIDER_SITE_OTHER)
Admission: RE | Admit: 2018-04-06 | Discharge: 2018-04-06 | Disposition: A | Discharge status: Home / Self Care | Payer: PPO | Source: Ambulatory Visit | Attending: Internal Medicine | Admitting: Internal Medicine

## 2018-04-06 DIAGNOSIS — R413 Other amnesia: Secondary | ICD-10-CM | POA: Diagnosis not present

## 2018-04-06 IMAGING — CT CT HEAD W/O CM
3 series · 12 of 33 positions shown, 14 images · non-contrast
Comparison: [DATE].

CLINICAL DATA: Progressive memory loss.

EXAM:
CT HEAD WITHOUT CONTRAST
TECHNIQUE: Contiguous axial images were obtained from the base of the skull
through the vertex without intravenous contrast.

[Series 2: head 5.0 h37s · axial · 0.45mm/px · z∈[-57,+58]mm · 4 of 33 slices shown, 5 images]
[im 5/33  soft-tissue]
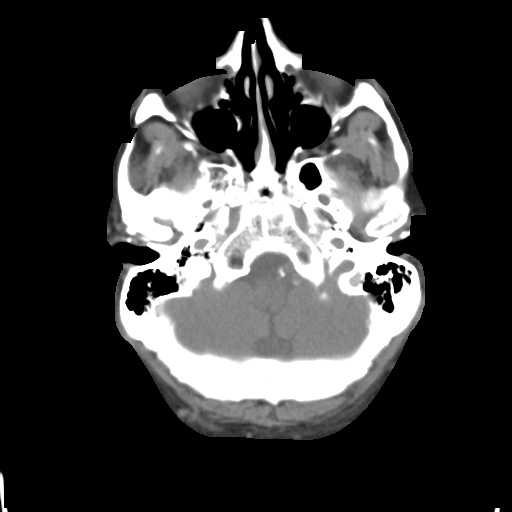
[im 5/33  bone]
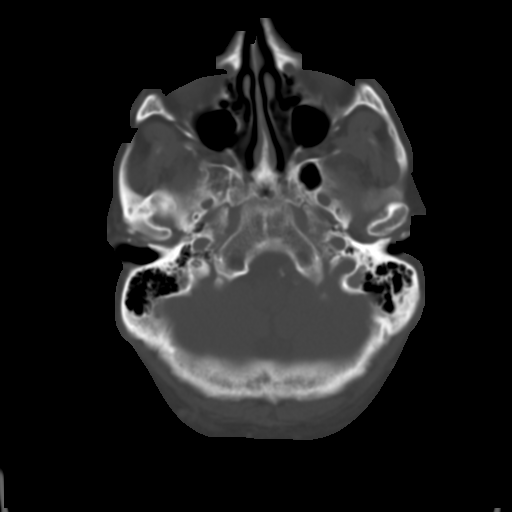
[im 13/33  bone]
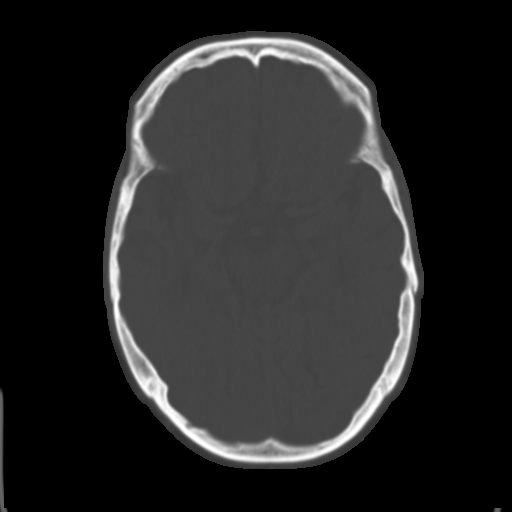
[im 20/33  bone]
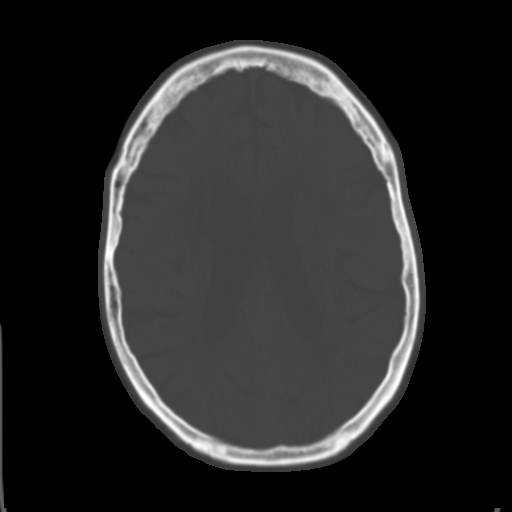
[im 28/33  bone]
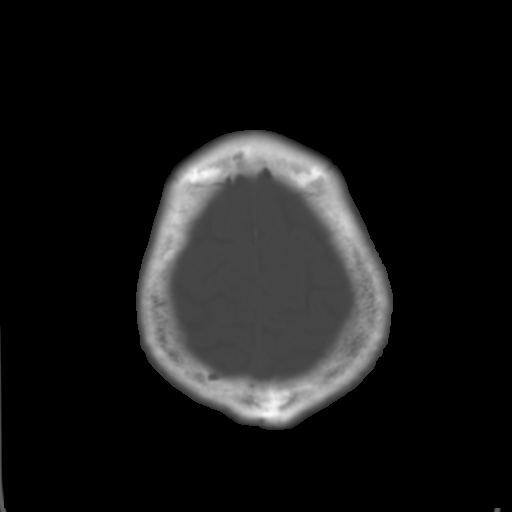

[Series 4: head 3.0 mpr cor · coronal · 0.31mm/px · 3 of 84 slices shown]
[im 17/84  bone]
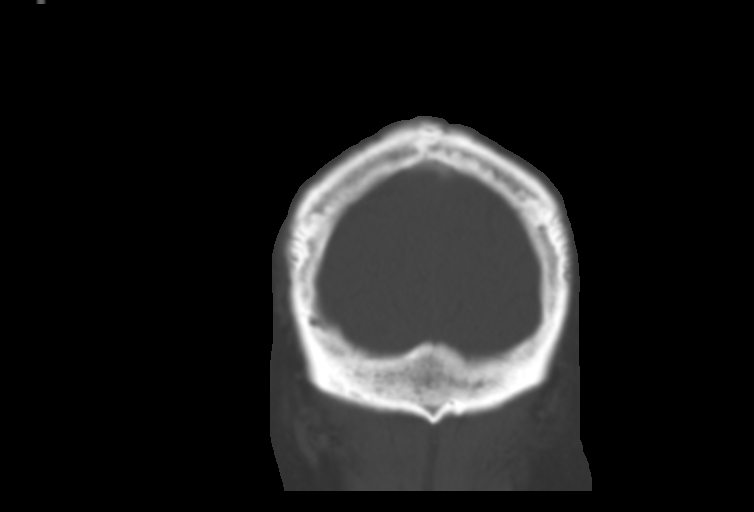
[im 34/84  bone]
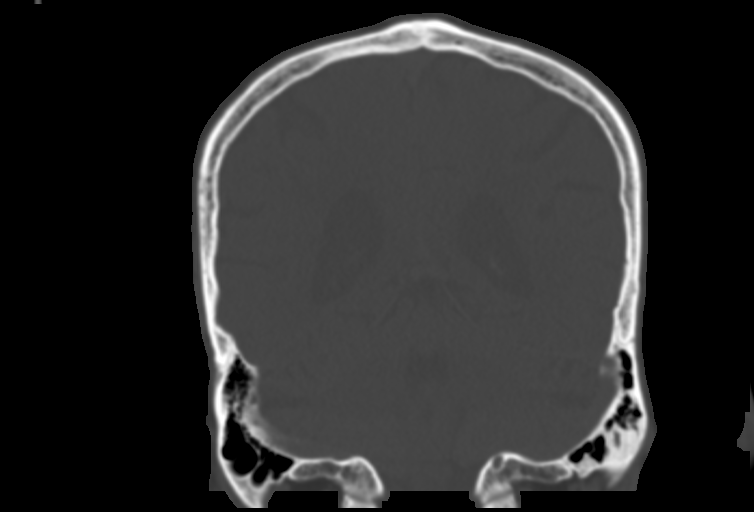
[im 50/84  bone]
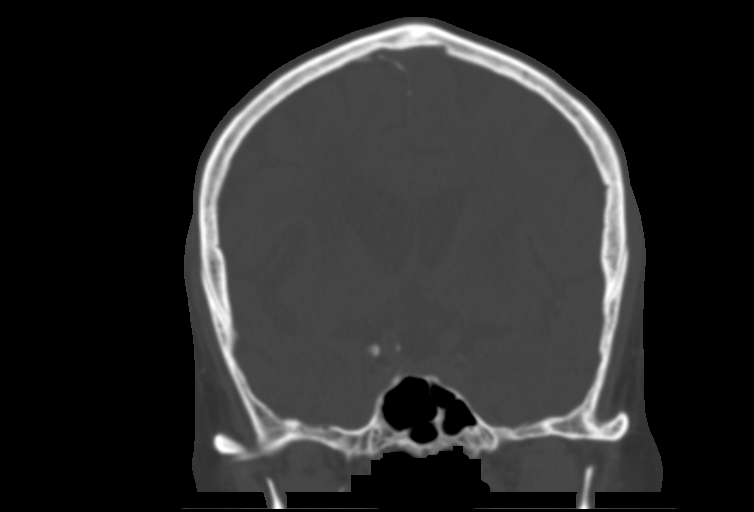

[Series 5: head 3.0 mpr sag · sagittal · 0.31mm/px · 5 of 72 slices shown, 6 images]
[im 24/72  bone]
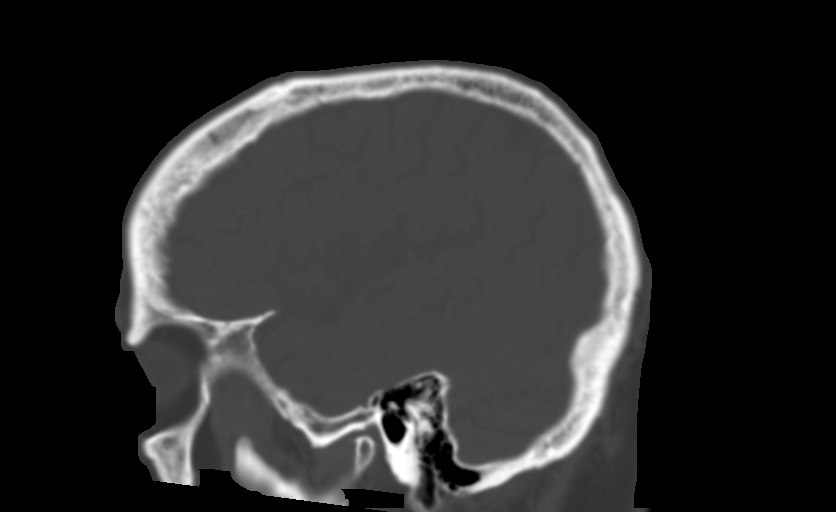
[im 30/72  bone]
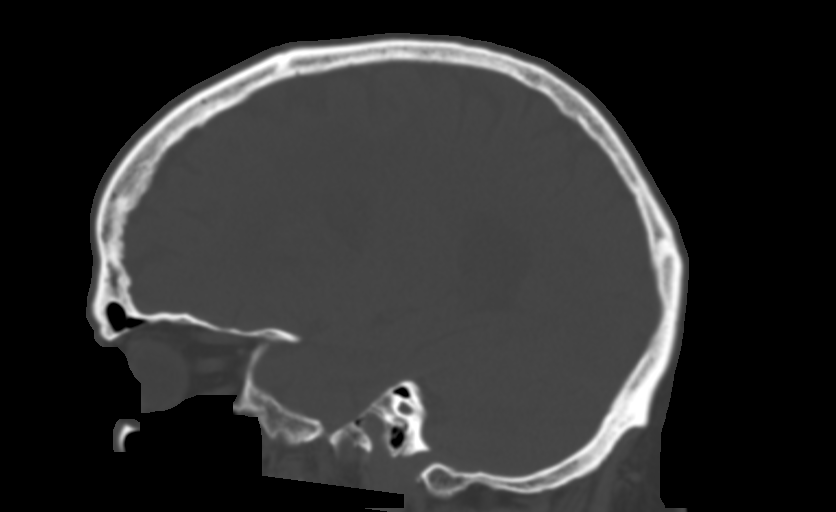
[im 36/72  soft-tissue]
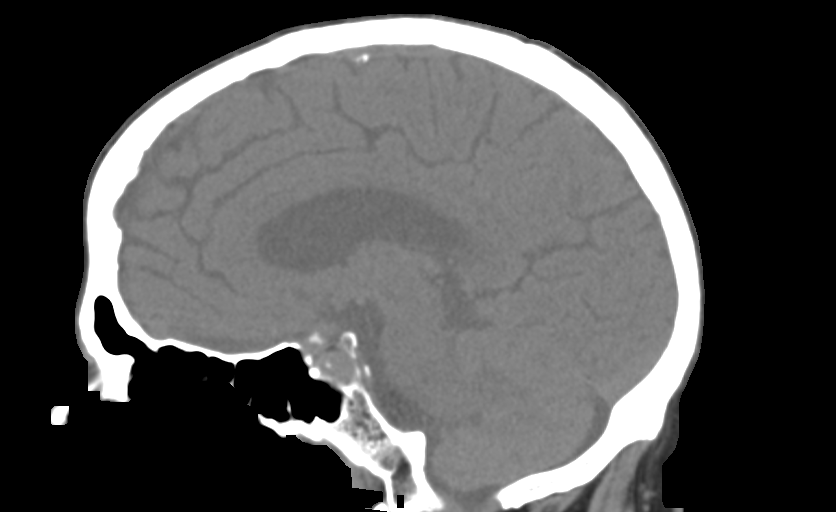
[im 36/72  bone]
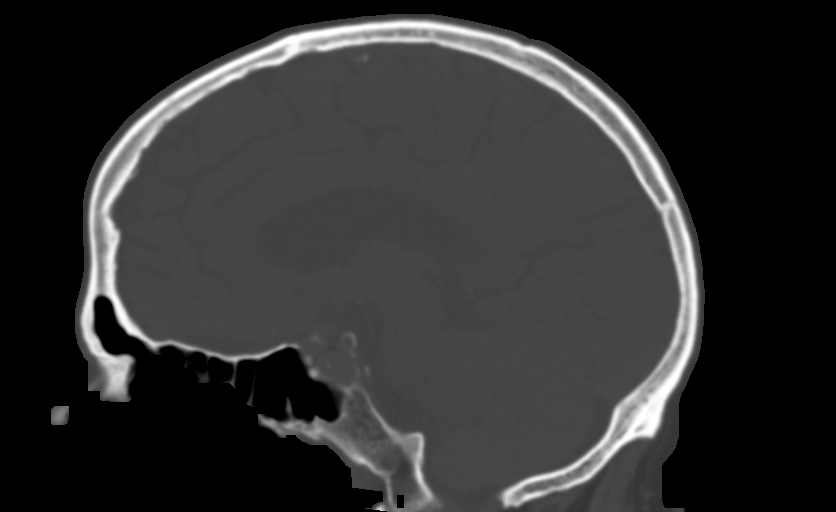
[im 42/72  bone]
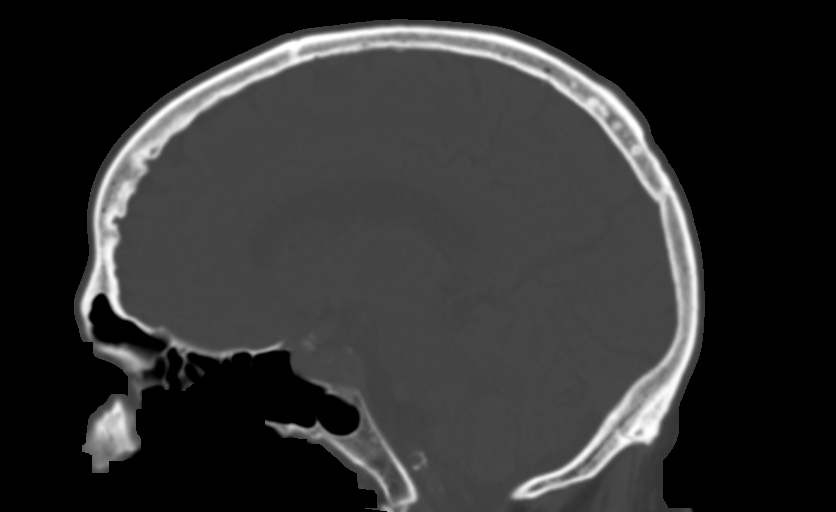
[im 48/72  bone]
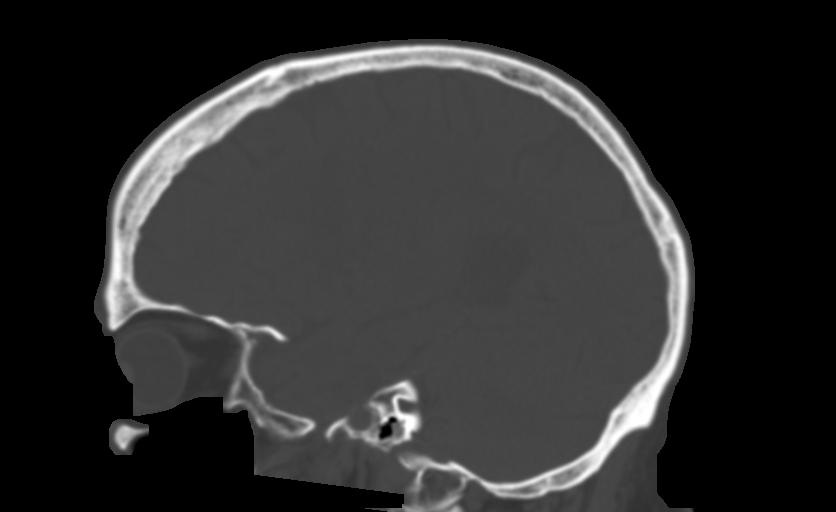

[12 of 33 positions shown; findings below may reference images not displayed]

FINDINGS: Brain: No evidence for acute infarction, hemorrhage, mass lesion,
hydrocephalus, or extra-axial fluid. Generalized atrophy. BILATERAL
basal ganglia infarcts, greater on the RIGHT. LEFT thalamic infarct.
Extensive hypoattenuation of white matter, likely small vessel
disease.

Vascular: Calcification of the cavernous internal carotid arteries
and distal vertebral arteries consistent with cerebrovascular
atherosclerotic disease. No signs of intracranial large vessel
occlusion.

Skull: Normal. Negative for fracture or focal lesion.

Sinuses/Orbits: No acute finding.

Other: None.
IMPRESSION: Atrophy and small vessel disease similar to priors. Evidence of
chronic ischemia. No acute intracranial findings.

## 2018-04-09 ENCOUNTER — Other Ambulatory Visit: Payer: Self-pay | Admitting: Family

## 2018-04-09 DIAGNOSIS — R300 Dysuria: Secondary | ICD-10-CM

## 2018-04-10 ENCOUNTER — Ambulatory Visit: Payer: Self-pay

## 2018-04-10 NOTE — Telephone Encounter (Signed)
noted 

## 2018-04-10 NOTE — Telephone Encounter (Signed)
Pt.'s son, [name], reports pt. Has been having some nausea, vomiting and dizziness since end of last month. Started on Aricept and Lexapro at that time. Son reports he is eating only one meal a day. Concerned he could have a UTI as well. Request an appointment for Thursday. Appointment made.Instructed to call back if symptoms worsen.Verbalizes understanding.  Reason for Disposition . Nausea lasts > 1 week  Answer Assessment - Initial Assessment Questions 1. NAUSEA SEVERITY: "How bad is the nausea?" (e.g., mild, moderate, severe; dehydration, weight loss)   - MILD: loss of appetite without change in eating habits   - MODERATE: decreased oral intake without significant weight loss, dehydration, or malnutrition   - SEVERE: inadequate caloric or fluid intake, significant weight loss, symptoms of dehydration     Moderate 2. ONSET: "When did the nausea begin?"     End of July 3. VOMITING: "Any vomiting?" If so, ask: "How many times today?"     Some per son 4. RECURRENT SYMPTOM: "Have you had nausea before?" If so, ask: "When was the last time?" "What happened that time?"     Not like this 5. CAUSE: "What do you think is causing the nausea?"     Unsure 6. PREGNANCY: "Is there any chance you are pregnant?" (e.g., unprotected intercourse, missed birth control pill, broken condom)     n/a  Protocols used: NAUSEA-A-AH

## 2018-04-12 ENCOUNTER — Encounter: Payer: Self-pay | Admitting: Internal Medicine

## 2018-04-12 ENCOUNTER — Other Ambulatory Visit: Payer: PPO

## 2018-04-12 ENCOUNTER — Ambulatory Visit (INDEPENDENT_AMBULATORY_CARE_PROVIDER_SITE_OTHER): Payer: PPO | Admitting: Internal Medicine

## 2018-04-12 VITALS — BP 114/80 | HR 51 | Temp 97.5°F | Ht 72.0 in | Wt 211.0 lb

## 2018-04-12 DIAGNOSIS — R112 Nausea with vomiting, unspecified: Secondary | ICD-10-CM | POA: Insufficient documentation

## 2018-04-12 DIAGNOSIS — R300 Dysuria: Secondary | ICD-10-CM

## 2018-04-12 DIAGNOSIS — R413 Other amnesia: Secondary | ICD-10-CM

## 2018-04-12 DIAGNOSIS — E538 Deficiency of other specified B group vitamins: Secondary | ICD-10-CM | POA: Insufficient documentation

## 2018-04-12 MED ORDER — CYANOCOBALAMIN 1000 MCG/ML IJ SOLN
1000.0000 ug | Freq: Once | INTRAMUSCULAR | Status: AC
  Administered 2018-04-12: 1000 ug via INTRAMUSCULAR

## 2018-04-12 MED ORDER — MEMANTINE HCL ER 7 MG PO CP24: 7.0000 mg | ORAL_CAPSULE | Freq: Every day | ORAL | 3 refills | Status: DC

## 2018-04-12 NOTE — Progress Notes (Signed)
   Subjective:    Patient ID: John Blackburn, male    DOB: 05-19-1946, 72 y.o.   MRN: [id number]  HPI The patient is a 72 YO man coming in for nausea and vomiting. Started about 1 week ago. We did start him on aricept recently and he has started this medicine within the last 2 weeks. Denies new foods or eating out new places. Has started B12 shots. They had not noticed a difference being on aricept 2 weeks. He has been eating poorly since that time and down about 10 pounds. Denies diarrhea or blood in stool. Denies abdominal pain but not feeling well.   Review of Systems  Constitutional: Negative.   HENT: Negative.   Eyes: Negative.   Respiratory: Negative for cough, chest tightness and shortness of breath.   Cardiovascular: Negative for chest pain, palpitations and leg swelling.  Gastrointestinal: Positive for nausea and vomiting. Negative for abdominal distention, abdominal pain, constipation and diarrhea.  Musculoskeletal: Negative.   Skin: Negative.   Neurological: Negative.       Objective:   Physical Exam  Constitutional: He is oriented to person, place, and time. He appears well-developed and well-nourished.  HENT:  Head: Normocephalic and atraumatic.  Eyes: EOM are normal.  Neck: Normal range of motion.  Cardiovascular: Normal rate and regular rhythm.  Pulmonary/Chest: Effort normal and breath sounds normal. No respiratory distress. He has no wheezes. He has no rales.  Abdominal: Soft. Bowel sounds are normal. He exhibits no distension. There is no tenderness. There is no rebound.  Musculoskeletal: He exhibits no edema.  Neurological: He is alert and oriented to person, place, and time. Coordination normal.  Skin: Skin is warm and dry.  Psychiatric: He has a normal mood and affect.   Vitals:   04/12/18 1044  BP: 114/80  Pulse: (!) 51  Temp: (!) 97.5 F (36.4 C)  TempSrc: Oral  SpO2: 98%  Weight: 211 lb (95.7 kg)  Height: 6' (1.829 m)      Assessment & Plan:    B12 shot given at visit

## 2018-04-12 NOTE — Assessment & Plan Note (Signed)
Likely related to recent aricept start. Will have him D/C and wait until symptoms improve then start replacement namenda.

## 2018-04-12 NOTE — Assessment & Plan Note (Signed)
Given B12 shot today and is on q 2 weeks for 2 months then monthly injection. It is unclear if this is related to memory change.

## 2018-04-12 NOTE — Assessment & Plan Note (Signed)
Stop aricept due to likely side effect. Given 10 pound weight loss will not re-trial. Rx for namenda 7 mg xr to start once symptoms improve.

## 2018-04-12 NOTE — Patient Instructions (Addendum)
We will have you stop taking aricept (donepezil) as it is causing the stomach problems.   We will have you wait 2-3 days once stopping aricept to make sure you are feeling better.   The new medicine for memory to start once feeling better is called namenda (memantine) to take 1 pill daily.   We have given you the B12 shot and will check the urine also today.

## 2018-04-14 ENCOUNTER — Other Ambulatory Visit: Payer: Self-pay | Admitting: Cardiovascular Disease

## 2018-04-14 ENCOUNTER — Other Ambulatory Visit: Payer: Self-pay | Admitting: Cardiology

## 2018-04-17 ENCOUNTER — Ambulatory Visit: Payer: PPO

## 2018-04-17 NOTE — Telephone Encounter (Signed)
Rx request sent to pharmacy.  

## 2018-04-20 ENCOUNTER — Encounter: Payer: Self-pay | Admitting: Internal Medicine

## 2018-04-23 ENCOUNTER — Emergency Department (HOSPITAL_COMMUNITY): Payer: PPO

## 2018-04-23 ENCOUNTER — Inpatient Hospital Stay (HOSPITAL_COMMUNITY)
Admission: EM | Admit: 2018-04-23 | Discharge: 2018-04-25 | DRG: 683 | Disposition: A | Discharge status: Skilled Nursing Facility | Payer: PPO | Attending: Internal Medicine | Admitting: Internal Medicine

## 2018-04-23 ENCOUNTER — Encounter (HOSPITAL_COMMUNITY): Payer: Self-pay | Admitting: Emergency Medicine

## 2018-04-23 ENCOUNTER — Other Ambulatory Visit: Payer: Self-pay

## 2018-04-23 DIAGNOSIS — R630 Anorexia: Secondary | ICD-10-CM | POA: Diagnosis not present

## 2018-04-23 DIAGNOSIS — N179 Acute kidney failure, unspecified: Secondary | ICD-10-CM | POA: Diagnosis present

## 2018-04-23 DIAGNOSIS — Z881 Allergy status to other antibiotic agents status: Secondary | ICD-10-CM | POA: Diagnosis not present

## 2018-04-23 DIAGNOSIS — R112 Nausea with vomiting, unspecified: Secondary | ICD-10-CM | POA: Diagnosis not present

## 2018-04-23 DIAGNOSIS — Z7902 Long term (current) use of antithrombotics/antiplatelets: Secondary | ICD-10-CM | POA: Diagnosis not present

## 2018-04-23 DIAGNOSIS — F0390 Unspecified dementia without behavioral disturbance: Secondary | ICD-10-CM | POA: Diagnosis present

## 2018-04-23 DIAGNOSIS — Z9989 Dependence on other enabling machines and devices: Secondary | ICD-10-CM | POA: Diagnosis not present

## 2018-04-23 DIAGNOSIS — Z85828 Personal history of other malignant neoplasm of skin: Secondary | ICD-10-CM | POA: Diagnosis not present

## 2018-04-23 DIAGNOSIS — I255 Ischemic cardiomyopathy: Secondary | ICD-10-CM | POA: Diagnosis present

## 2018-04-23 DIAGNOSIS — M6281 Muscle weakness (generalized): Secondary | ICD-10-CM | POA: Diagnosis not present

## 2018-04-23 DIAGNOSIS — R7989 Other specified abnormal findings of blood chemistry: Secondary | ICD-10-CM | POA: Diagnosis present

## 2018-04-23 DIAGNOSIS — F321 Major depressive disorder, single episode, moderate: Secondary | ICD-10-CM | POA: Diagnosis not present

## 2018-04-23 DIAGNOSIS — Z955 Presence of coronary angioplasty implant and graft: Secondary | ICD-10-CM | POA: Diagnosis not present

## 2018-04-23 DIAGNOSIS — R41841 Cognitive communication deficit: Secondary | ICD-10-CM | POA: Diagnosis not present

## 2018-04-23 DIAGNOSIS — Z79899 Other long term (current) drug therapy: Secondary | ICD-10-CM | POA: Diagnosis not present

## 2018-04-23 DIAGNOSIS — R64 Cachexia: Secondary | ICD-10-CM | POA: Diagnosis present

## 2018-04-23 DIAGNOSIS — R9431 Abnormal electrocardiogram [ECG] [EKG]: Secondary | ICD-10-CM

## 2018-04-23 DIAGNOSIS — G4733 Obstructive sleep apnea (adult) (pediatric): Secondary | ICD-10-CM | POA: Diagnosis not present

## 2018-04-23 DIAGNOSIS — G4731 Primary central sleep apnea: Secondary | ICD-10-CM | POA: Diagnosis present

## 2018-04-23 DIAGNOSIS — F418 Other specified anxiety disorders: Secondary | ICD-10-CM | POA: Diagnosis present

## 2018-04-23 DIAGNOSIS — R05 Cough: Secondary | ICD-10-CM | POA: Diagnosis not present

## 2018-04-23 DIAGNOSIS — R2689 Other abnormalities of gait and mobility: Secondary | ICD-10-CM | POA: Diagnosis not present

## 2018-04-23 DIAGNOSIS — E785 Hyperlipidemia, unspecified: Secondary | ICD-10-CM | POA: Diagnosis not present

## 2018-04-23 DIAGNOSIS — R4020 Unspecified coma: Secondary | ICD-10-CM | POA: Diagnosis not present

## 2018-04-23 DIAGNOSIS — R278 Other lack of coordination: Secondary | ICD-10-CM | POA: Diagnosis not present

## 2018-04-23 DIAGNOSIS — Z86711 Personal history of pulmonary embolism: Secondary | ICD-10-CM

## 2018-04-23 DIAGNOSIS — R197 Diarrhea, unspecified: Secondary | ICD-10-CM

## 2018-04-23 DIAGNOSIS — K210 Gastro-esophageal reflux disease with esophagitis: Secondary | ICD-10-CM | POA: Diagnosis not present

## 2018-04-23 DIAGNOSIS — I110 Hypertensive heart disease with heart failure: Secondary | ICD-10-CM | POA: Diagnosis not present

## 2018-04-23 DIAGNOSIS — D72829 Elevated white blood cell count, unspecified: Secondary | ICD-10-CM | POA: Diagnosis present

## 2018-04-23 DIAGNOSIS — K219 Gastro-esophageal reflux disease without esophagitis: Secondary | ICD-10-CM | POA: Diagnosis present

## 2018-04-23 DIAGNOSIS — Z96653 Presence of artificial knee joint, bilateral: Secondary | ICD-10-CM | POA: Diagnosis not present

## 2018-04-23 DIAGNOSIS — I2510 Atherosclerotic heart disease of native coronary artery without angina pectoris: Secondary | ICD-10-CM | POA: Diagnosis present

## 2018-04-23 DIAGNOSIS — Z6827 Body mass index (BMI) 27.0-27.9, adult: Secondary | ICD-10-CM

## 2018-04-23 DIAGNOSIS — E876 Hypokalemia: Secondary | ICD-10-CM | POA: Diagnosis not present

## 2018-04-23 DIAGNOSIS — E860 Dehydration: Secondary | ICD-10-CM | POA: Diagnosis not present

## 2018-04-23 DIAGNOSIS — I10 Essential (primary) hypertension: Secondary | ICD-10-CM | POA: Diagnosis present

## 2018-04-23 DIAGNOSIS — Z7982 Long term (current) use of aspirin: Secondary | ICD-10-CM | POA: Diagnosis not present

## 2018-04-23 LAB — CBC
HEMATOCRIT: 44.9 % (ref 39.0–52.0)
HEMOGLOBIN: 15.3 g/dL (ref 13.0–17.0)
MCH: 33 pg (ref 26.0–34.0)
MCHC: 34.1 g/dL (ref 30.0–36.0)
MCV: 96.8 fL (ref 78.0–100.0)
Platelets: 253 10*3/uL (ref 150–400)
RBC: 4.64 MIL/uL (ref 4.22–5.81)
RDW: 13.2 % (ref 11.5–15.5)
WBC: 16.1 10*3/uL — ABNORMAL HIGH (ref 4.0–10.5)

## 2018-04-23 LAB — LIPASE, BLOOD: LIPASE: 74 U/L — AB (ref 11–51)

## 2018-04-23 LAB — COMPREHENSIVE METABOLIC PANEL
ALBUMIN: 3.9 g/dL (ref 3.5–5.0)
ALK PHOS: 157 U/L — AB (ref 38–126)
ALT: 42 U/L (ref 0–44)
ANION GAP: 14 (ref 5–15)
AST: 52 U/L — ABNORMAL HIGH (ref 15–41)
BUN: 42 mg/dL — ABNORMAL HIGH (ref 8–23)
CALCIUM: 9.6 mg/dL (ref 8.9–10.3)
CHLORIDE: 93 mmol/L — AB (ref 98–111)
CO2: 31 mmol/L (ref 22–32)
Creatinine, Ser: 2.11 mg/dL — ABNORMAL HIGH (ref 0.61–1.24)
GFR calc non Af Amer: 30 mL/min — ABNORMAL LOW (ref >60)
GFR, EST AFRICAN AMERICAN: 34 mL/min — AB (ref >60)
GLUCOSE: 226 mg/dL — AB (ref 70–99)
POTASSIUM: 2.6 mmol/L — AB (ref 3.5–5.1)
SODIUM: 138 mmol/L (ref 135–145)
Total Bilirubin: 1.5 mg/dL — ABNORMAL HIGH (ref 0.3–1.2)
Total Protein: 7.4 g/dL (ref 6.5–8.1)

## 2018-04-23 LAB — I-STAT TROPONIN, ED: TROPONIN I, POC: 0.01 ng/mL (ref 0.00–0.08)

## 2018-04-23 IMAGING — DX DG CHEST 2V
2 series · 2 of 2 positions shown · non-contrast
Comparison: [DATE]

CLINICAL DATA: Cough

EXAM:
CHEST - 2 VIEW

[x chest ap]
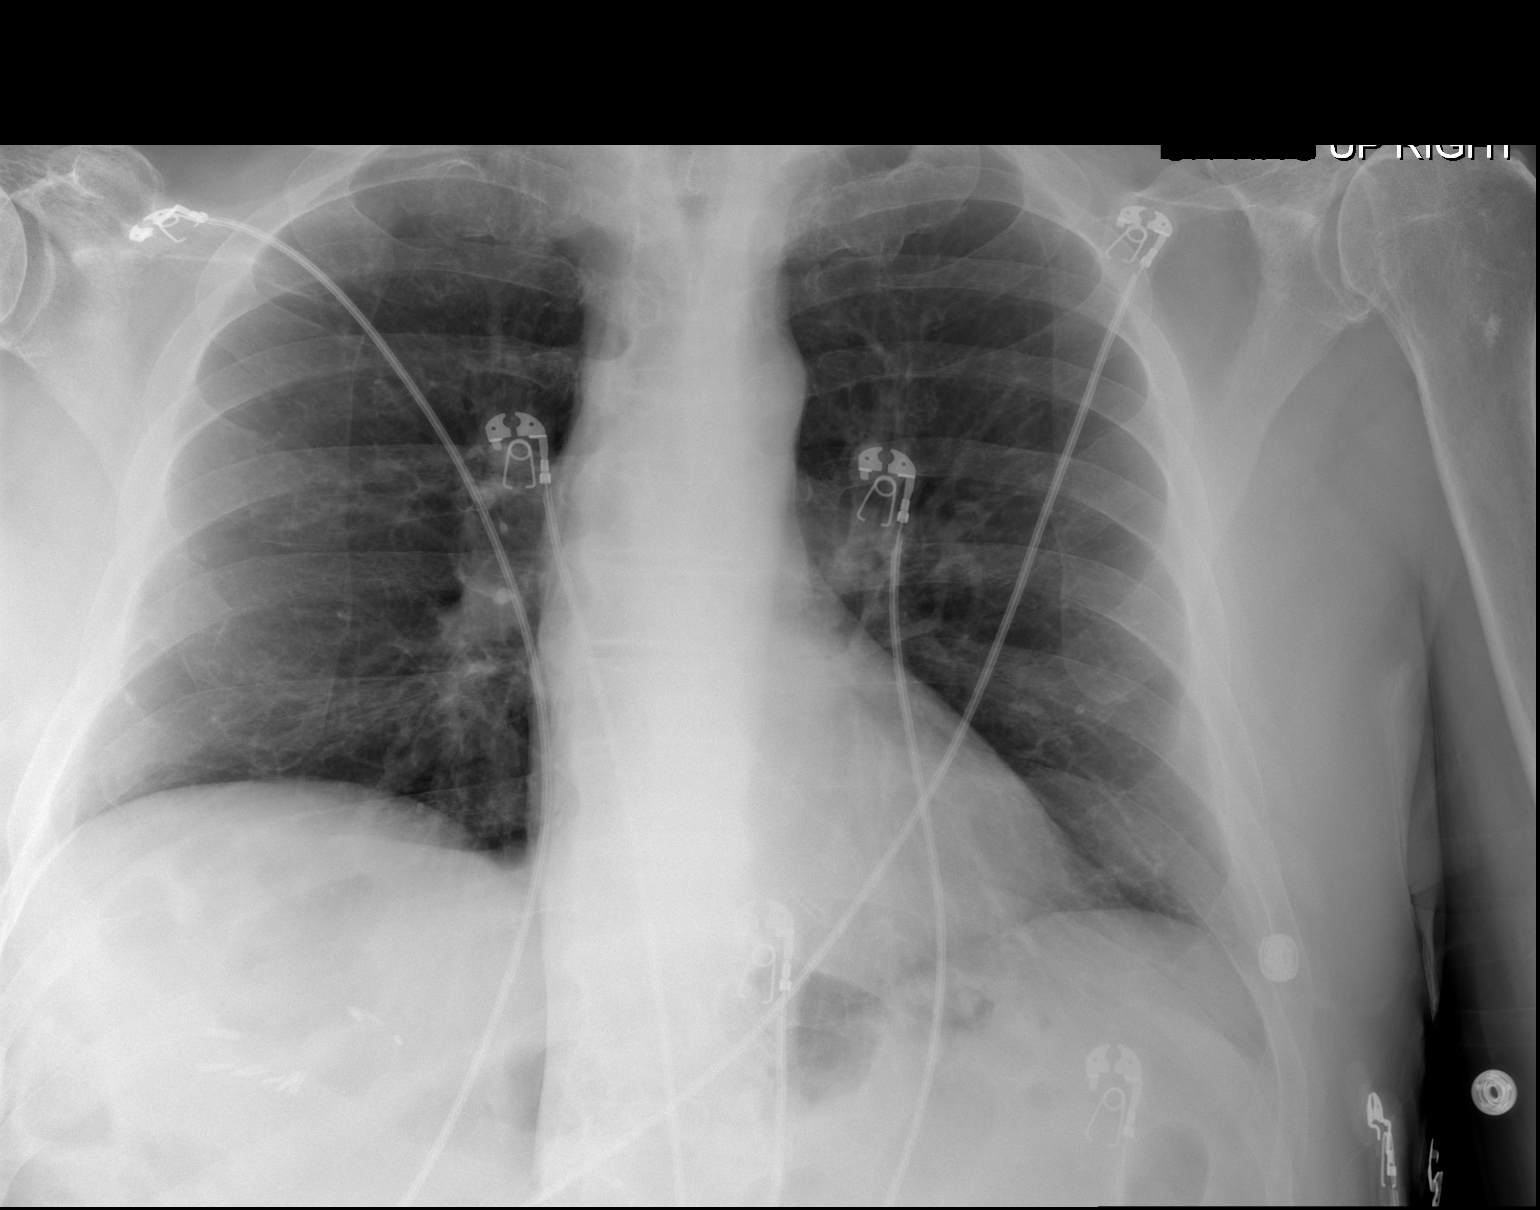

[w chest lat]
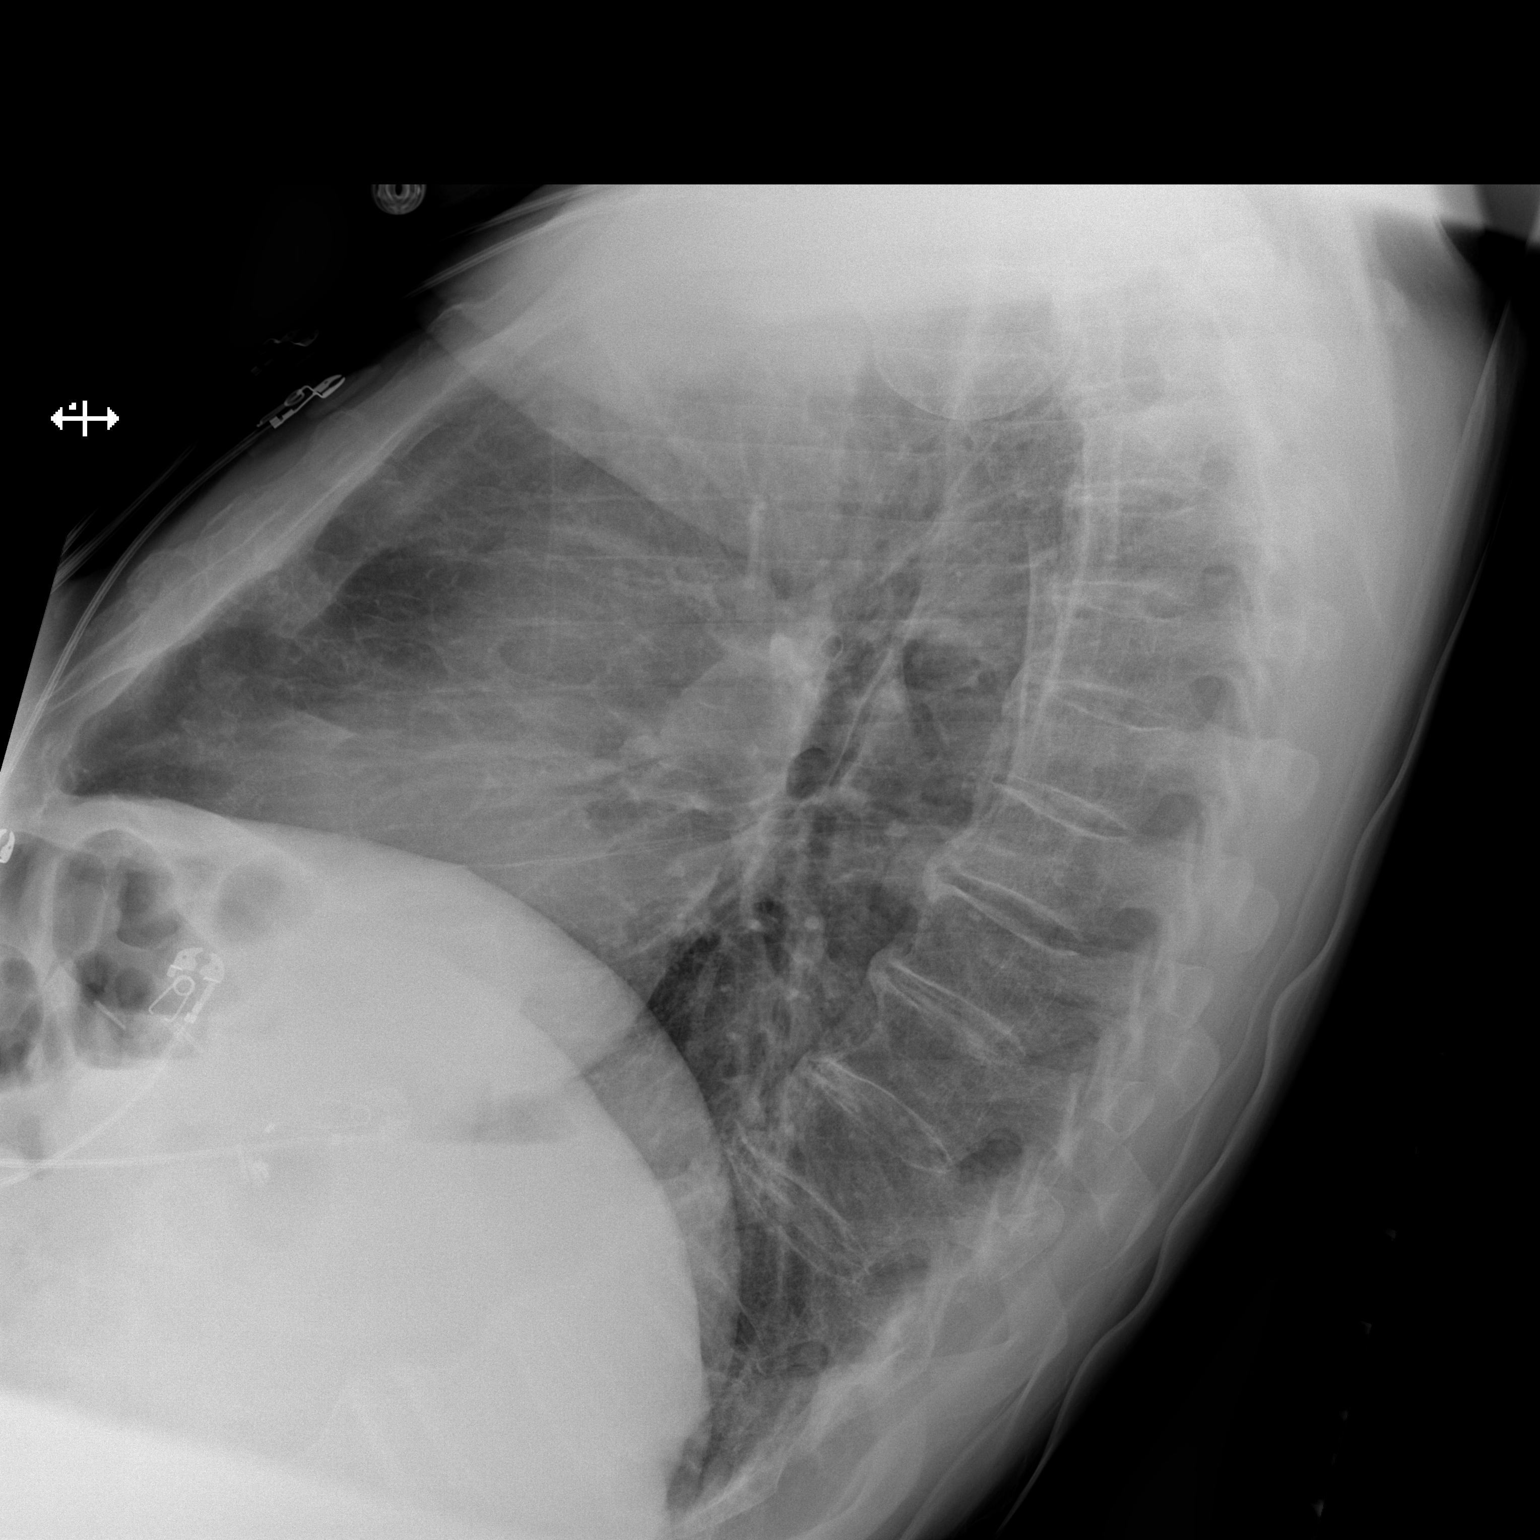

[2 of 2 positions shown; findings below may reference images not displayed]

FINDINGS: The heart size and mediastinal contours are within normal limits.
Both lungs are clear. Surgical clips in the right upper quadrant.
Degenerative changes of the spine.
IMPRESSION: No active cardiopulmonary disease.

## 2018-04-23 IMAGING — CT CT HEAD W/O CM
4 series · 17 of 47 positions shown, 19 images · non-contrast
Comparison: Head CT [DATE]

CLINICAL DATA: Altered level of consciousness (LOC), unexplained.
Patient reports multiple falls.

EXAM:
CT HEAD WITHOUT CONTRAST
TECHNIQUE: Contiguous axial images were obtained from the base of the skull
through the vertex without intravenous contrast.

[Series 3: head without · axial · non-contrast · 0.45mm/px · z∈[-110,+20]mm · 7 of 36 slices shown, 9 images]
[im 5/36  brain]
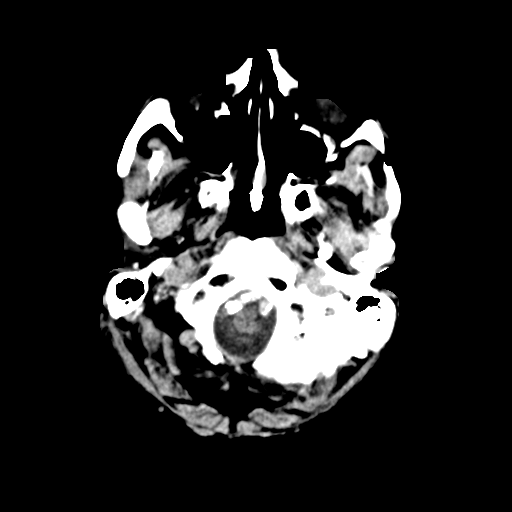
[im 5/36  bone]
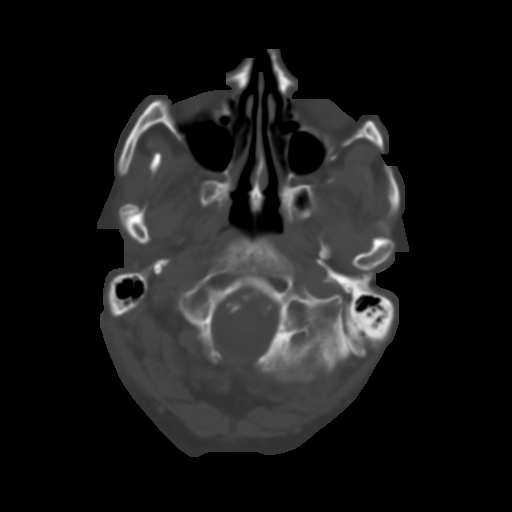
[im 9/36  brain]
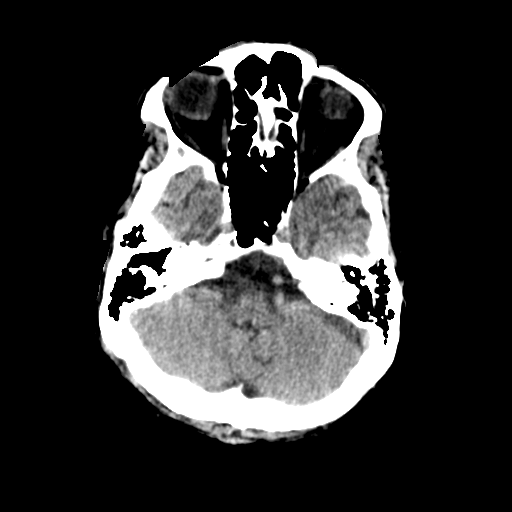
[im 14/36  brain]
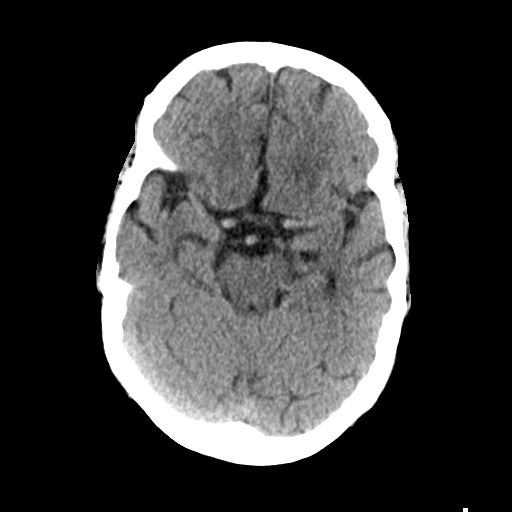
[im 18/36  brain]
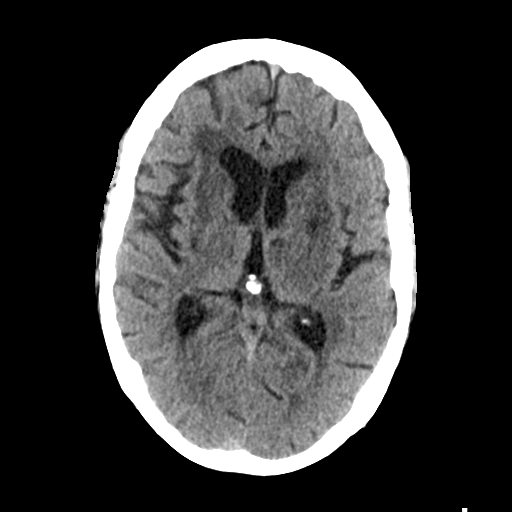
[im 22/36  brain]
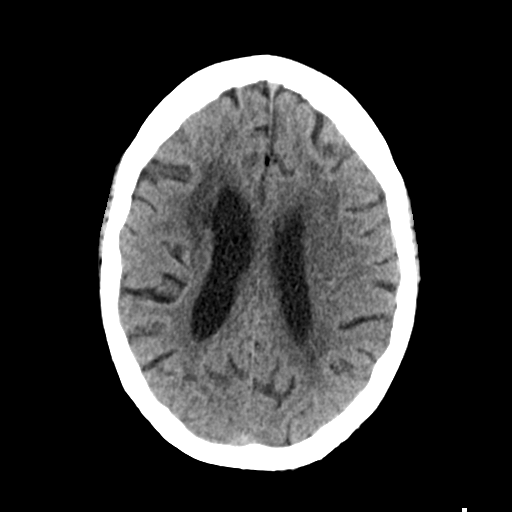
[im 22/36  bone]
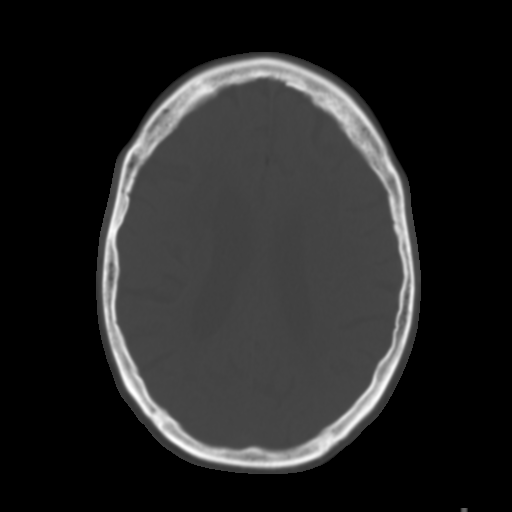
[im 27/36  brain]
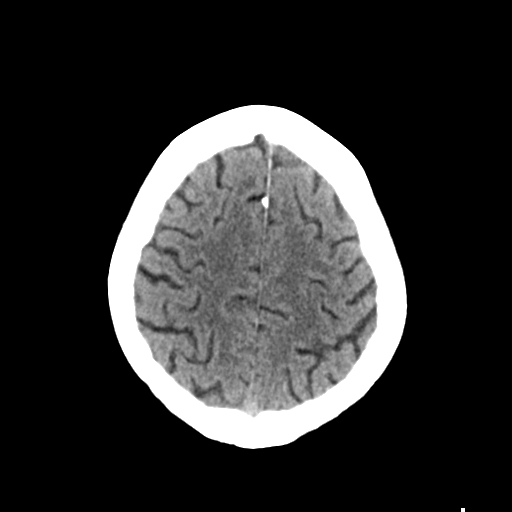
[im 31/36  brain]
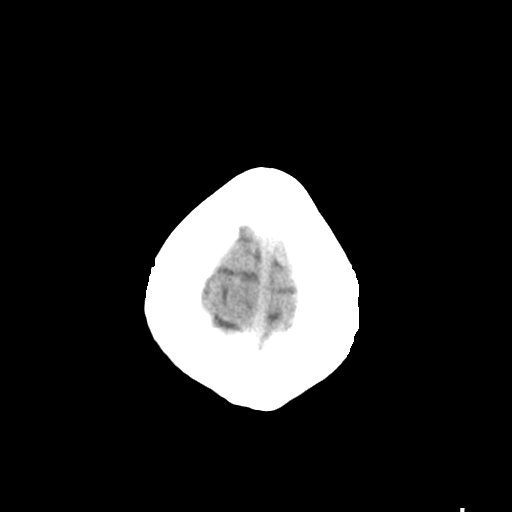

[Series 4: head bone · axial · 0.45mm/px · z∈[-114,-52]mm · 4 of 89 slices shown]
[im 9/89  bone]
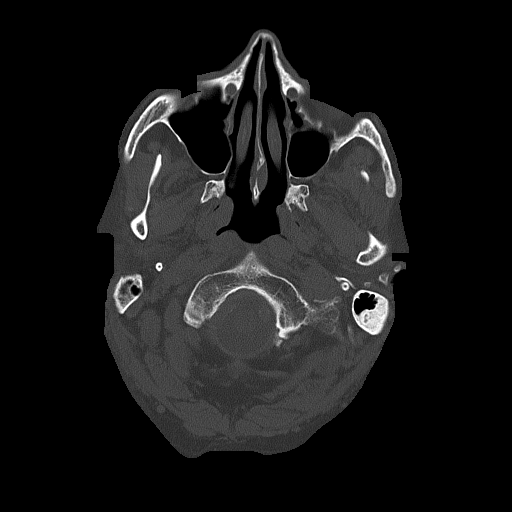
[im 18/89  bone]
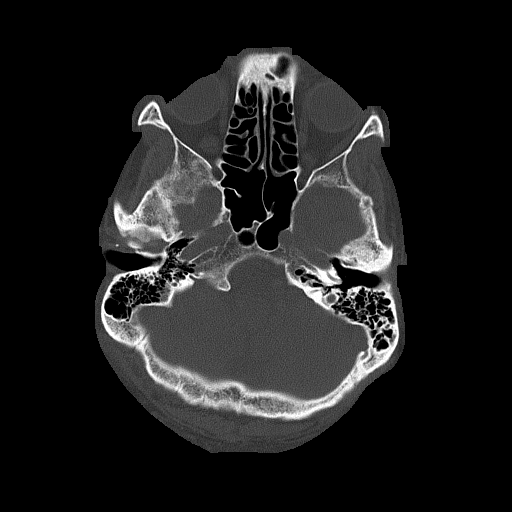
[im 27/89  bone]
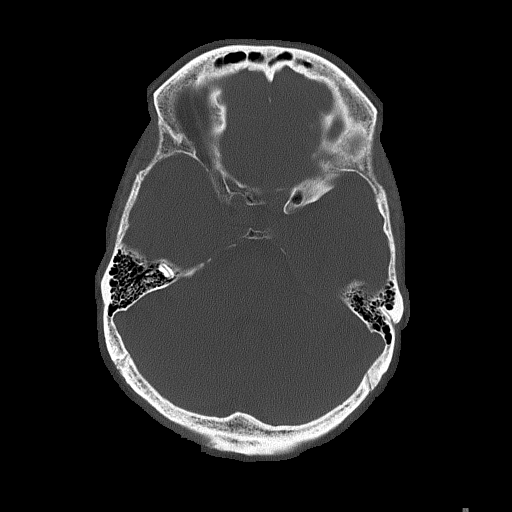
[im 40/89  bone]
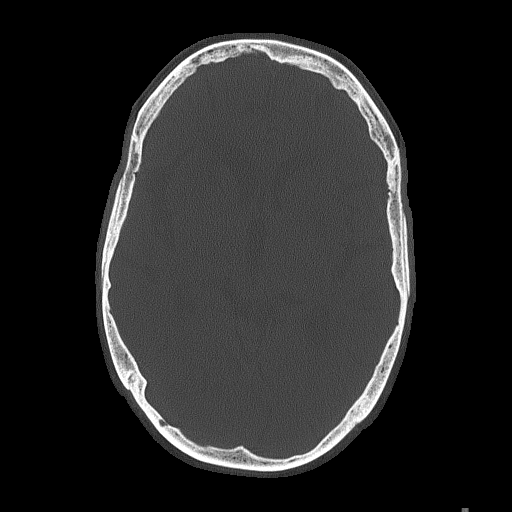

[Series 5: head without cor · coronal · non-contrast · 0.36mm/px · 3 of 74 slices shown]
[im 25/74  brain]
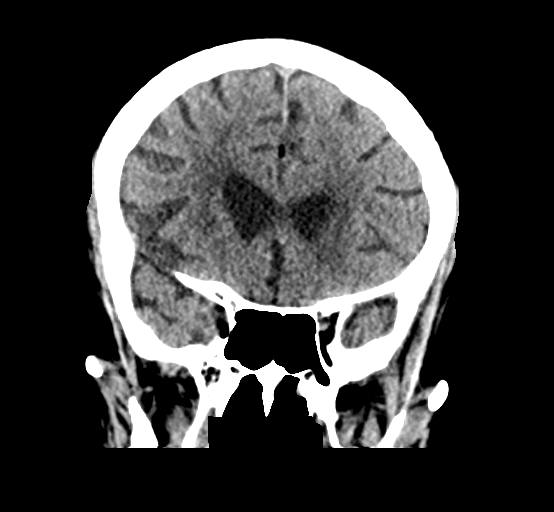
[im 33/74  brain]
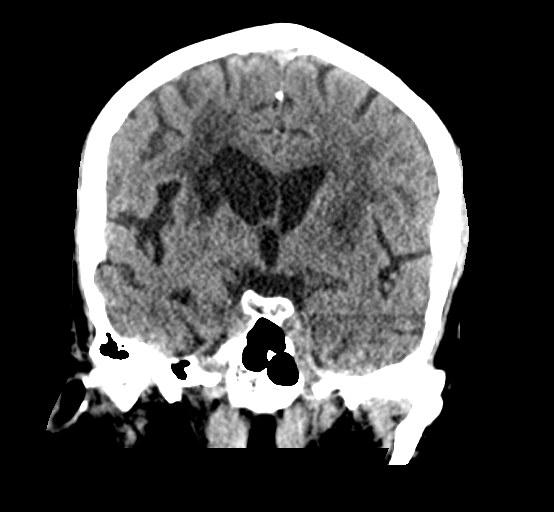
[im 41/74  brain]
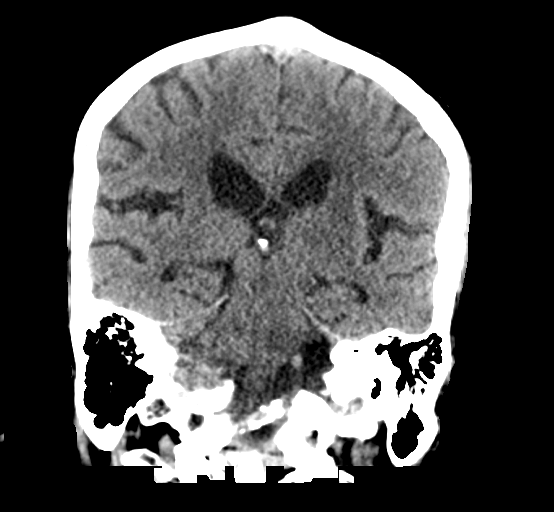

[Series 6: head without sag · sagittal · non-contrast · 0.37mm/px · 3 of 62 slices shown]
[im 21/62  brain]
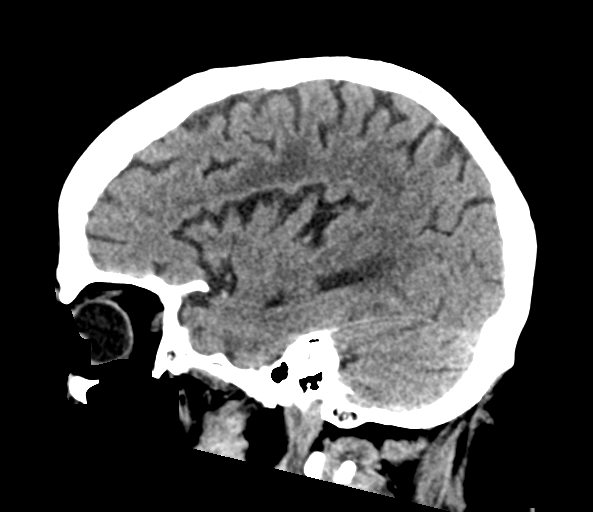
[im 31/62  brain]
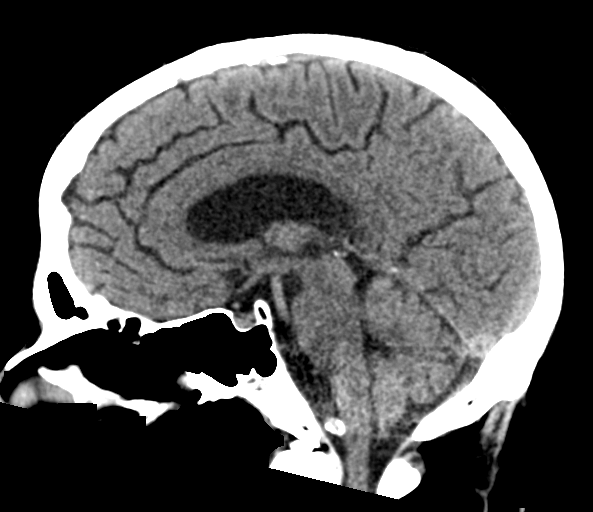
[im 41/62  brain]
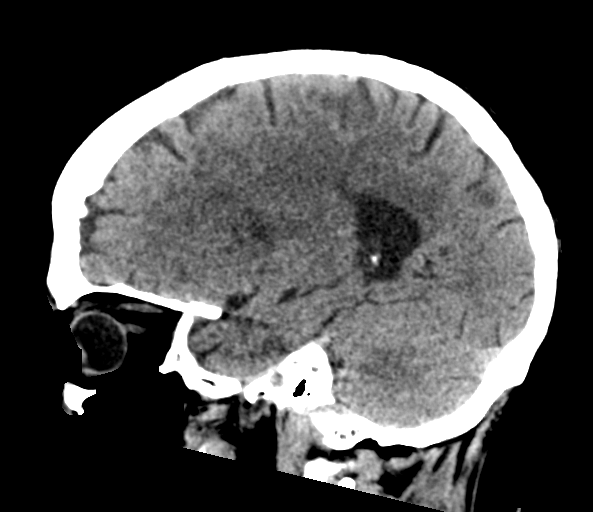

[17 of 47 positions shown; findings below may reference images not displayed]

FINDINGS: Brain: Unchanged atrophy and chronic small vessel ischemia. Remote
bilateral basal ganglia air and left thalamic lacunar infarct. No
intracranial hemorrhage, mass effect, or midline shift. No
hydrocephalus. The basilar cisterns are patent. No evidence of
territorial infarct or acute ischemia. No extra-axial or
intracranial fluid collection.

Vascular: Atherosclerosis of skullbase vasculature without
hyperdense vessel or abnormal calcification.

Skull: No fracture or focal lesion.

Sinuses/Orbits: Paranasal sinuses and mastoid air cells are clear.
The visualized orbits are unremarkable.

Other: None.
IMPRESSION: 1.  No acute intracranial abnormality.
2. Unchanged advanced chronic small vessel ischemia, remote lacunar
infarcts and generalized atrophy.

## 2018-04-23 MED ORDER — MEMANTINE HCL ER 7 MG PO CP24
7.0000 mg | ORAL_CAPSULE | Freq: Every day | ORAL | Status: DC
  Administered 2018-04-24 – 2018-04-25 (×2): 7 mg via ORAL
  Filled 2018-04-23 (×2): qty 1

## 2018-04-23 MED ORDER — BUPROPION HCL ER (XL) 150 MG PO TB24
300.0000 mg | ORAL_TABLET | Freq: Every day | ORAL | Status: DC
  Administered 2018-04-24 – 2018-04-25 (×2): 300 mg via ORAL
  Filled 2018-04-23 (×2): qty 2

## 2018-04-23 MED ORDER — ONDANSETRON HCL 4 MG/2ML IJ SOLN
4.0000 mg | Freq: Once | INTRAMUSCULAR | Status: AC
  Administered 2018-04-23: 4 mg via INTRAVENOUS
  Filled 2018-04-23: qty 2

## 2018-04-23 MED ORDER — ESCITALOPRAM OXALATE 10 MG PO TABS
10.0000 mg | ORAL_TABLET | Freq: Every day | ORAL | Status: DC
  Administered 2018-04-24 – 2018-04-25 (×2): 10 mg via ORAL
  Filled 2018-04-23 (×2): qty 1

## 2018-04-23 MED ORDER — NITROGLYCERIN 0.4 MG SL SUBL: 0.4000 mg | SUBLINGUAL_TABLET | SUBLINGUAL | Status: DC | PRN

## 2018-04-23 MED ORDER — CLOPIDOGREL BISULFATE 75 MG PO TABS
75.0000 mg | ORAL_TABLET | Freq: Every day | ORAL | Status: DC
  Administered 2018-04-24 – 2018-04-25 (×2): 75 mg via ORAL
  Filled 2018-04-23 (×2): qty 1

## 2018-04-23 MED ORDER — PANTOPRAZOLE SODIUM 40 MG PO TBEC
40.0000 mg | DELAYED_RELEASE_TABLET | Freq: Every day | ORAL | Status: DC
  Administered 2018-04-24 – 2018-04-25 (×2): 40 mg via ORAL
  Filled 2018-04-23 (×2): qty 1

## 2018-04-23 MED ORDER — LACTATED RINGERS IV BOLUS
1000.0000 mL | Freq: Once | INTRAVENOUS | Status: AC
  Administered 2018-04-23: 1000 mL via INTRAVENOUS

## 2018-04-23 MED ORDER — LORATADINE 10 MG PO TABS: 10.0000 mg | ORAL_TABLET | Freq: Every day | ORAL | Status: DC | PRN

## 2018-04-23 MED ORDER — HEPARIN SODIUM (PORCINE) 5000 UNIT/ML IJ SOLN
5000.0000 [IU] | Freq: Three times a day (TID) | INTRAMUSCULAR | Status: DC
  Administered 2018-04-24 – 2018-04-25 (×4): 5000 [IU] via SUBCUTANEOUS
  Filled 2018-04-23 (×4): qty 1

## 2018-04-23 MED ORDER — MELATONIN 3 MG PO TABS
3.0000 mg | ORAL_TABLET | Freq: Every evening | ORAL | Status: DC | PRN
  Administered 2018-04-24: 3 mg via ORAL
  Filled 2018-04-23 (×2): qty 1

## 2018-04-23 MED ORDER — POTASSIUM CHLORIDE CRYS ER 20 MEQ PO TBCR
40.0000 meq | EXTENDED_RELEASE_TABLET | Freq: Every day | ORAL | Status: DC
  Administered 2018-04-24: 40 meq via ORAL
  Filled 2018-04-23: qty 2

## 2018-04-23 MED ORDER — POTASSIUM CHLORIDE 10 MEQ/100ML IV SOLN
10.0000 meq | INTRAVENOUS | Status: AC
  Administered 2018-04-23 (×2): 10 meq via INTRAVENOUS
  Filled 2018-04-23 (×2): qty 100

## 2018-04-23 MED ORDER — KCL IN DEXTROSE-NACL 40-5-0.9 MEQ/L-%-% IV SOLN
INTRAVENOUS | Status: DC
  Administered 2018-04-24: 01:00:00 via INTRAVENOUS
  Filled 2018-04-23 (×2): qty 1000

## 2018-04-23 MED ORDER — ATORVASTATIN CALCIUM 80 MG PO TABS
80.0000 mg | ORAL_TABLET | Freq: Every day | ORAL | Status: DC
  Administered 2018-04-24: 80 mg via ORAL
  Filled 2018-04-23: qty 1

## 2018-04-23 MED ORDER — ASPIRIN EC 81 MG PO TBEC
81.0000 mg | DELAYED_RELEASE_TABLET | Freq: Every day | ORAL | Status: DC
  Administered 2018-04-24 – 2018-04-25 (×2): 81 mg via ORAL
  Filled 2018-04-23 (×2): qty 1

## 2018-04-23 NOTE — ED Notes (Signed)
Per MD, paged Stemi MD.

## 2018-04-23 NOTE — ED Notes (Signed)
Posterior EKG given to EDP

## 2018-04-23 NOTE — H&P (Signed)
History and Physical    John Blackburn MCN:[id number] DOB: [birth year] DOA: 04/23/2018  Referring MD/NP/PA: Dr Ronnald Nian  PCP: Hoyt Koch, MD   Patient coming from: Home  Chief Complaint: Nausea vomiting and generalized weakness  HPI: John Blackburn is a 72 y.o. male with medical history significant of coronary artery disease, ischemic cardiomyopathy, hypertension, hyperlipidemia, GERD among other things who presented with significant anorexia with recurrent episode of vomiting and nausea for the last 1 week.  Patient started having some mild fever yesterday.  He has generally not been doing much better and become dehydrated.  He is on Lasix and chlorthalidone which he continues to take despite not eating or drinking adequately.  Denied any chest pain.  Denied any significant diarrhea.  Patient denies any sick contacts or any recent travel.  In the ER he was found to have acute kidney injury, was severe hypokalemia as well as slight change in his EKG.  ED Course: Patient is afebrile with a temperature of 98.  His blood pressure is 89/57, pulse is 53 with respiratory rate of 18 oxygen sat 96% room air.  His white count is 16.1 with hemoglobin 15.3 and platelet 253.  Sodium 138 potassium 2.6 chloride 93 CO2 31 BUN is 42 and creatinine 2.11 glucose 226.  Chest x-ray showed no active disease.  CT head without contrast was also negative  Review of Systems: As per HPI otherwise 10 point review of systems negative.    Past Medical History:  Diagnosis Date  . Allergy   . 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 CAD (coronary artery disease)    08/2017 PCI/DES mRCA, RPDA, CTO pf pLCX with collaterals, normal EF  . Depression    on multiple meds. Has been seen at [facility]. and Dr. Sabra Heck is his prescriber  . Diverticulitis of colon 2000 's   treated as an outpatient  . GERD (gastroesophageal reflux disease)    UGI done August '12 - ulcer/. Dr. Ferdinand Lango,  gastroenterologist in [facility].   [name] Hx of adenomatous colonic polyps   . Hyperlipidemia    last lipid panel: HDL 44, LDL 117  . Hypertension   . Peptic ulcer    in the past and just recently-August '12  . Pulmonary emboli [facility])    noted October 2014 - treated by Dr. Linda Hedges  . skin cancer    skin CA  . Sleep apnea, primary central    wears CPAP    Past Surgical History:  Procedure Laterality Date  . ANAL RECTAL MANOMETRY N/A 11/30/2016   Procedure: ANO RECTAL MANOMETRY;  Surgeon: Mauri Pole, MD;  Location: [facility];  Service: Endoscopy;  Laterality: N/A;  . CARDIAC CATHETERIZATION    . CHOLECYSTECTOMY  1990   laproscopic   . CORONARY STENT INTERVENTION N/A 09/01/2017   Procedure: CORONARY STENT INTERVENTION;  Surgeon: Jettie Booze, MD;  Location: [place] [facility];  Service: Cardiovascular;  Laterality: N/A;  . HERNIA REPAIR    . JOINT REPLACEMENT     knee  . PROSTATE SURGERY     needle biopsy's, TURP  . RIGHT/LEFT HEART CATH AND CORONARY ANGIOGRAPHY N/A 09/01/2017   Procedure: RIGHT/LEFT HEART CATH AND CORONARY ANGIOGRAPHY;  Surgeon: Jettie Booze, MD;  Location: [place] [facility] LAB;  Service: Cardiovascular;  Laterality: N/A;  . TKR bilateral  2006   Dr. Violet Baldy  . UPPER GASTROINTESTINAL ENDOSCOPY    . VASECTOMY  reports that he has never smoked. He has never used smokeless tobacco. He reports that he drinks about 1.0 standard drinks of alcohol per week. He reports that he does not use drugs.  Allergies  Allergen Reactions  . Cephalexin Rash  . Levofloxacin Rash    Family History  Problem Relation Age of Onset  . Stroke Mother   . Hypertension Mother   . Heart disease Mother   . Heart disease Father        CAD/MI-fatal sudden death  . Colon cancer Father   . Colon cancer Sister        colon cancer [id number]-survivor  . Heart disease Paternal Uncle   . Colon cancer Paternal Uncle   . Heart disease Paternal Grandfather   .  Colon cancer Paternal Aunt   . Colon cancer Cousin   . Colon cancer Cousin   . Colon cancer Paternal Uncle   . Stomach cancer Neg Hx   . Rectal cancer Neg Hx   . Esophageal cancer Neg Hx     Prior to Admission medications   Medication Sig Start Date End Date Taking? Authorizing Provider  aspirin EC 81 MG EC tablet Take 1 tablet (81 mg total) by mouth daily. 09/05/17  Yes Cheryln Manly, NP  atenolol-chlorthalidone (TENORETIC) 50-25 MG tablet TAKE ONE TABLET BY MOUTH ONCE DAILY 07/26/17  Yes Hoyt Koch, MD  atorvastatin (LIPITOR) 80 MG tablet TAKE 1 TABLET BY MOUTH ONCE DAILY AT  6PM Patient taking differently: Take 80 mg by mouth daily at 6 PM.  04/17/18  Yes Reino Bellis B, NP  buPROPion (WELLBUTRIN XL) 300 MG 24 hr tablet TAKE 1 TABLET BY MOUTH ONCE DAILY 01/30/18  Yes Hoyt Koch, MD  clopidogrel (PLAVIX) 75 MG tablet Take 1 tablet (75 mg total) by mouth daily. 09/05/17  Yes Reino Bellis B, NP  escitalopram (LEXAPRO) 10 MG tablet Take 1 tablet (10 mg total) by mouth daily. 03/30/18  Yes Hoyt Koch, MD  furosemide (LASIX) 40 MG tablet Take 1 tablet (40 mg total) by mouth daily. 03/09/18  Yes Hoyt Koch, MD  loratadine (CLARITIN) 10 MG tablet Take 10 mg by mouth daily as needed for allergies.    Yes [provider]  Melatonin 1 MG/ML LIQD Take 2.5 mg by mouth at bedtime as needed (sleep).    Yes [provider]  memantine (NAMENDA XR) 7 MG CP24 24 hr capsule Take 1 capsule (7 mg total) by mouth daily. 04/12/18  Yes Hoyt Koch, MD  nitroGLYCERIN (NITROSTAT) 0.4 MG SL tablet Place 1 tablet (0.4 mg total) under the tongue every 5 (five) minutes as needed. 09/04/17  Yes Reino Bellis B, NP  pantoprazole (PROTONIX) 40 MG tablet TAKE 1 TABLET BY MOUTH ONCE DAILY **PLEASE  CALL  TO  MAKE  APPOINTMENT  FOR  FUTHER  REFILLS** Patient taking differently: Take 40 mg by mouth daily.  04/17/18  Yes Lorretta Harp, MD    potassium chloride SA (K-DUR,KLOR-CON) 20 MEQ tablet Take 2 tablets (40 mEq total) by mouth daily. 12/01/17  Yes Hoyt Koch, MD    Physical Exam: Vitals:   04/23/18 2100 04/23/18 2115 04/23/18 2130 04/23/18 2205  BP: 113/70 114/73 103/64 (!) 89/57  Pulse: (!) 54 (!) 54 (!) 53 (!) 58  Resp: 14 18  16   Temp:      TempSrc:      SpO2: 100% 100% 100% 100%  Weight:      Height:  Constitutional: NAD, calm, comfortable Vitals:   04/23/18 2100 04/23/18 2115 04/23/18 2130 04/23/18 2205  BP: 113/70 114/73 103/64 (!) 89/57  Pulse: (!) 54 (!) 54 (!) 53 (!) 58  Resp: 14 18  16   Temp:      TempSrc:      SpO2: 100% 100% 100% 100%  Weight:      Height:       Chronically ill looking, very dehydrated, fully awake and alert Eyes: PERRL, lids and conjunctivae normal ENMT: Mucous membranes are dry. Posterior pharynx clear of any exudate or lesions.Normal dentition.  Neck: normal, supple, no masses, no thyromegaly Respiratory: clear to auscultation bilaterally, no wheezing, no crackles. Normal respiratory effort. No accessory muscle use.  Cardiovascular: Regular rate and rhythm, no murmurs / rubs / gallops. No extremity edema. 2+ pedal pulses. No carotid bruits.  Abdomen: no tenderness, no masses palpated. No hepatosplenomegaly. Bowel sounds positive.  Musculoskeletal: no clubbing / cyanosis. No joint deformity upper and lower extremities. Good ROM, no contractures. Normal muscle tone.  Skin: no rashes, lesions, ulcers. No induration Neurologic: CN 2-12 grossly intact. Sensation intact, DTR normal. Strength 5/5 in all 4.  Psychiatric: Normal judgment and insight. Alert and oriented x 3. Normal mood.   Labs on Admission: I have personally reviewed following labs and imaging studies  CBC: Recent Labs  Lab 04/23/18 1610  WBC 16.1*  HGB 15.3  HCT 44.9  MCV 96.8  PLT 672   Basic Metabolic Panel: Recent Labs  Lab 04/23/18 1610  NA 138  K 2.6*  CL 93*  CO2 31   GLUCOSE 226*  BUN 42*  CREATININE 2.11*  CALCIUM 9.6   GFR: Estimated Creatinine Clearance: 34.7 mL/min (A) (by C-G formula based on SCr of 2.11 mg/dL (H)). Liver Function Tests: Recent Labs  Lab 04/23/18 1610  AST 52*  ALT 42  ALKPHOS 157*  BILITOT 1.5*  PROT 7.4  ALBUMIN 3.9   Recent Labs  Lab 04/23/18 1610  LIPASE 74*   No results for input(s): AMMONIA in the last 168 hours. Coagulation Profile: No results for input(s): INR, PROTIME in the last 168 hours. Cardiac Enzymes: No results for input(s): CKTOTAL, CKMB, CKMBINDEX, TROPONINI in the last 168 hours. BNP (last 3 results) No results for input(s): PROBNP in the last 8760 hours. HbA1C: No results for input(s): HGBA1C in the last 72 hours. CBG: No results for input(s): GLUCAP in the last 168 hours. Lipid Profile: No results for input(s): CHOL, HDL, LDLCALC, TRIG, CHOLHDL, LDLDIRECT in the last 72 hours. Thyroid Function Tests: No results for input(s): TSH, T4TOTAL, FREET4, T3FREE, THYROIDAB in the last 72 hours. Anemia Panel: No results for input(s): VITAMINB12, FOLATE, FERRITIN, TIBC, IRON, RETICCTPCT in the last 72 hours. Urine analysis:    Component Value Date/Time   COLORURINE YELLOW 09/02/2017 [place] 09/02/2017 0854   LABSPEC 1.018 09/02/2017 0854   PHURINE 5.0 09/02/2017 0854   GLUCOSEU NEGATIVE 09/02/2017 0854   HGBUR SMALL (A) 09/02/2017 0854   BILIRUBINUR NEGATIVE 09/02/2017 0854   BILIRUBINUR negative 09/13/2016 1816   BILIRUBINUR neg 12/06/2011 1743   KETONESUR NEGATIVE 09/02/2017 0854   PROTEINUR NEGATIVE 09/02/2017 0854   UROBILINOGEN 0.2 09/13/2016 1816   NITRITE NEGATIVE 09/02/2017 0854   LEUKOCYTESUR NEGATIVE 09/02/2017 0854   Sepsis Labs: @LABRCNTIP (procalcitonin:4,lacticidven:4) )No results found for this or any previous visit (from the past 240 hour(s)).   Radiological Exams on Admission: Dg Chest 2 View  Result Date: 04/23/2018 CLINICAL DATA:  Cough EXAM:  CHEST - 2 VIEW COMPARISON:  08/30/2017 FINDINGS: The heart size and mediastinal contours are within normal limits. Both lungs are clear. Surgical clips in the right upper quadrant. Degenerative changes of the spine. IMPRESSION: No active cardiopulmonary disease. Electronically Signed   By: Donavan Foil M.D.   On: 04/23/2018 19:26   Ct Head Wo Contrast  Result Date: 04/23/2018 CLINICAL DATA:  Altered level of consciousness (LOC), unexplained. Patient reports multiple falls. EXAM: CT HEAD WITHOUT CONTRAST TECHNIQUE: Contiguous axial images were obtained from the base of the skull through the vertex without intravenous contrast. COMPARISON:  Head CT 04/06/2018 FINDINGS: Brain: Unchanged atrophy and chronic small vessel ischemia. Remote bilateral basal ganglia air and left thalamic lacunar infarct. No intracranial hemorrhage, mass effect, or midline shift. No hydrocephalus. The basilar cisterns are patent. No evidence of territorial infarct or acute ischemia. No extra-axial or intracranial fluid collection. Vascular: Atherosclerosis of skullbase vasculature without hyperdense vessel or abnormal calcification. Skull: No fracture or focal lesion. Sinuses/Orbits: Paranasal sinuses and mastoid air cells are clear. The visualized orbits are unremarkable. Other: None. IMPRESSION: 1.  No acute intracranial abnormality. 2. Unchanged advanced chronic small vessel ischemia, remote lacunar infarcts and generalized atrophy. Electronically Signed   By: Jeb Levering M.D.   On: 04/23/2018 21:43    EKG: Independently reviewed.  Diffuse ST changes with normal sinus rhythm.  Assessment/Plan Principal Problem:   ARF (acute renal failure) (HCC) Active Problems:   Hypertension   Hyperlipidemia   Obstructive sleep apnea   History of pulmonary embolism   GERD (gastroesophageal reflux disease)   Hypokalemia   Dehydration   Anorexia   Leucocytosis   EKG abnormalities    #1 acute kidney injury: Appears to be  prerenal.  Patient has not been eating or drinking for 2 weeks as indicated.  We will aggressively hydrate patient after admission.  Follow renal function closely.  We may check his FENA if no appreciable response.  Consider renal ultrasound also if renal function worsens.  Avoid nephrotoxic medication.  Hold his Lasix as well as Tenoretic.  #2 severe dehydration: Secondary to decreased oral intake.  Again IV hydration at the moment.  #3 hypokalemia: Potassium is only 2.5.  Combination of diuresis and poor oral intake most likely.  We will hydrate and replete potassium.  Check magnesium level.  #4 anorexia: Patient has nonspecific anorexia.  We will get nutrition consultation.  Evaluate for the course.  He has dementia which may be contributing.  Patient has history of depression which may also be responsible for his anorexia.  #5 coronary artery disease: Stable.  Continue Plavix and nitroglycerin.  Hold Lasix and his Tenoretic.  Continue aspirin.  #6 dementia: No significant agitation.  Continue close monitoring.  #7 GERD: Continue with PPIs.  #8 abnormal EKG: Patient has some ST depressions in the lateral leads.  This is most likely related to electrolyte imbalance especially potassium.  He has history of coronary artery disease but no chest pain.  Initial enzymes so far negative.  May repeat EKG in the morning.  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  Discussed care in detail with patient  Disposition Plan: To be determined  Consults called: Cardiology was called by the ER.  No plan to follow-up Admission status: Inpatient   Severity of Illness: The appropriate patient status for this patient is INPATIENT. Inpatient  status is judged to be reasonable and necessary in order to provide the required intensity of service to ensure  the patient's safety. The patient's presenting symptoms, physical exam findings, and initial radiographic and laboratory data in the context of their chronic comorbidities is felt to place them at high risk for further clinical deterioration. Furthermore, it is not anticipated that the patient will be medically stable for discharge from the hospital within 2 midnights of admission. The following factors support the patient status of inpatient.   " The patient's presenting symptoms include generalized weakness. " The worrisome physical exam findings include cachexia with significant dehydration and dry mucous membranes. " The initial radiographic and laboratory data are worrisome because of potassium of 2.5 with acute kidney injury. " The chronic co-morbidities include coronary artery disease with CHF..   * I certify that at the point of admission it is my clinical judgment that the patient will require inpatient hospital care spanning beyond 2 midnights from the point of admission due to high intensity of service, high risk for further deterioration and high frequency of surveillance required.Barbette Merino MD [facility] Pager [phone]  If 7PM-7AM, please contact night-coverage [email or web address] Password [facility]  04/23/2018, 10:37 PM

## 2018-04-23 NOTE — ED Provider Notes (Addendum)
[facility] EMERGENCY DEPARTMENT Provider Note   CSN: [id number] Arrival date & time: 04/23/18  1548     History   Chief Complaint Chief Complaint  Patient presents with  . Emesis    HPI John Blackburn is a 72 y.o. male.   The history is provided by the patient.   Emesis    This is a chronic problem. The current episode started more than 1 week ago. The problem occurs 5 to 10 times per day. The problem has not changed since onset.The emesis has an appearance of stomach contents. There has been no fever. The fever has been present for less than 1 day. Associated symptoms include diarrhea. Pertinent negatives include no abdominal pain, no arthralgias, no chills, no cough, no fever, no headaches, no myalgias, no sweats and no URI. Risk factors: none.    Past Medical History:  Diagnosis Date  . Allergy   . 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 CAD (coronary artery disease)    08/2017 PCI/DES mRCA, RPDA, CTO pf pLCX with collaterals, normal EF  . Depression    on multiple meds. Has been seen at [facility]. and Dr. Sabra Heck is his prescriber  . Diverticulitis of colon 2000 's   treated as an outpatient  . GERD (gastroesophageal reflux disease)    UGI done August '12 - ulcer/. Dr. Ferdinand Lango, gastroenterologist in [facility].   [name] Hx of adenomatous colonic polyps   . Hyperlipidemia    last lipid panel: HDL 44, LDL 117  . Hypertension   . Peptic ulcer    in the past and just recently-August '12  . Pulmonary emboli [facility])    noted October 2014 - treated by Dr. Linda Hedges  . skin cancer    skin CA  . Sleep apnea, primary central    wears CPAP    Patient Active Problem List   Diagnosis Date Noted  . ARF (acute renal failure) ([place]) 04/23/2018  . Hypokalemia 04/23/2018  . Dehydration 04/23/2018  . Anorexia 04/23/2018  . Leucocytosis 04/23/2018  . EKG abnormalities 04/23/2018  . Nausea and vomiting 04/12/2018  . B12 deficiency 04/12/2018  .  Memory loss 11/28/2017  . Balance problem 11/28/2017  . skin cancer   . Sleep apnea, primary central   . GERD (gastroesophageal reflux disease)   . MDD (major depressive disorder), single episode, moderate ([place])   . CAD S/P percutaneous coronary angioplasty 09/02/2017  . Acute congestive heart failure ([place]) 08/30/2017  . Elevated blood sugar 01/17/2017  . Incontinence of feces   . Allergic rhinitis 10/15/2013  . Chronic venous insufficiency 07/20/2013  . History of pulmonary embolism 07/08/2013  . ED (erectile dysfunction) 06/01/2011  . Arthritis   . Hypertension   . Hyperlipidemia   . Obstructive sleep apnea     Past Surgical History:  Procedure Laterality Date  . ANAL RECTAL MANOMETRY N/A 11/30/2016   Procedure: ANO RECTAL MANOMETRY;  Surgeon: Mauri Pole, MD;  Location: [facility];  Service: Endoscopy;  Laterality: N/A;  . CARDIAC CATHETERIZATION    . CHOLECYSTECTOMY  1990   laproscopic   . CORONARY STENT INTERVENTION N/A 09/01/2017   Procedure: CORONARY STENT INTERVENTION;  Surgeon: Jettie Booze, MD;  Location: [place] CV LAB;  Service: Cardiovascular;  Laterality: N/A;  . HERNIA REPAIR    . JOINT REPLACEMENT     knee  . PROSTATE SURGERY     needle biopsy's, TURP  . RIGHT/LEFT HEART  CATH AND CORONARY ANGIOGRAPHY N/A 09/01/2017   Procedure: RIGHT/LEFT HEART CATH AND CORONARY ANGIOGRAPHY;  Surgeon: Jettie Booze, MD;  Location: [place] CV LAB;  Service: Cardiovascular;  Laterality: N/A;  . TKR bilateral  2006   Dr. Violet Baldy  . UPPER GASTROINTESTINAL ENDOSCOPY    . VASECTOMY          Home Medications    Prior to Admission medications   Medication Sig Start Date End Date Taking? Authorizing Provider  aspirin EC 81 MG EC tablet Take 1 tablet (81 mg total) by mouth daily. 09/05/17  Yes Cheryln Manly, NP  atenolol-chlorthalidone (TENORETIC) 50-25 MG tablet TAKE ONE TABLET BY MOUTH ONCE DAILY 07/26/17  Yes Hoyt Koch, MD    atorvastatin (LIPITOR) 80 MG tablet TAKE 1 TABLET BY MOUTH ONCE DAILY AT  6PM Patient taking differently: Take 80 mg by mouth daily at 6 PM.  04/17/18  Yes Reino Bellis B, NP  buPROPion (WELLBUTRIN XL) 300 MG 24 hr tablet TAKE 1 TABLET BY MOUTH ONCE DAILY 01/30/18  Yes Hoyt Koch, MD  clopidogrel (PLAVIX) 75 MG tablet Take 1 tablet (75 mg total) by mouth daily. 09/05/17  Yes Reino Bellis B, NP  escitalopram (LEXAPRO) 10 MG tablet Take 1 tablet (10 mg total) by mouth daily. 03/30/18  Yes Hoyt Koch, MD  furosemide (LASIX) 40 MG tablet Take 1 tablet (40 mg total) by mouth daily. 03/09/18  Yes Hoyt Koch, MD  loratadine (CLARITIN) 10 MG tablet Take 10 mg by mouth daily as needed for allergies.    Yes [provider]  Melatonin 1 MG/ML LIQD Take 2.5 mg by mouth at bedtime as needed (sleep).    Yes [provider]  memantine (NAMENDA XR) 7 MG CP24 24 hr capsule Take 1 capsule (7 mg total) by mouth daily. 04/12/18  Yes Hoyt Koch, MD  nitroGLYCERIN (NITROSTAT) 0.4 MG SL tablet Place 1 tablet (0.4 mg total) under the tongue every 5 (five) minutes as needed. 09/04/17  Yes Reino Bellis B, NP  pantoprazole (PROTONIX) 40 MG tablet TAKE 1 TABLET BY MOUTH ONCE DAILY **PLEASE  CALL  TO  MAKE  APPOINTMENT  FOR  FUTHER  REFILLS** Patient taking differently: Take 40 mg by mouth daily.  04/17/18  Yes Lorretta Harp, MD  potassium chloride SA (K-DUR,KLOR-CON) 20 MEQ tablet Take 2 tablets (40 mEq total) by mouth daily. 12/01/17  Yes Hoyt Koch, MD    Family History Family History  Problem Relation Age of Onset  . Stroke Mother   . Hypertension Mother   . Heart disease Mother   . Heart disease Father        CAD/MI-fatal sudden death  . Colon cancer Father   . Colon cancer Sister        colon cancer 20090-survivor  . Heart disease Paternal Uncle   . Colon cancer Paternal Uncle   . Heart disease Paternal Grandfather   . Colon cancer  Paternal Aunt   . Colon cancer Cousin   . Colon cancer Cousin   . Colon cancer Paternal Uncle   . Stomach cancer Neg Hx   . Rectal cancer Neg Hx   . Esophageal cancer Neg Hx     Social History Social History   Tobacco Use  . Smoking status: Never Smoker  . Smokeless tobacco: Never Used  Substance Use Topics  . Alcohol use: Yes    Alcohol/week: 1.0 standard drinks    Types: 1 Standard  drinks or equivalent per week    Comment: social  . Drug use: No     Allergies   Cephalexin and Levofloxacin   Review of Systems Review of Systems  Constitutional: Positive for appetite change, fatigue and unexpected weight change. Negative for chills and fever.  HENT: Negative for ear pain and sore throat.   Eyes: Negative for pain and visual disturbance.  Respiratory: Negative for cough and shortness of breath.   Cardiovascular: Negative for chest pain and palpitations.  Gastrointestinal: Positive for diarrhea and vomiting. Negative for abdominal pain.  Genitourinary: Negative for dysuria and hematuria.  Musculoskeletal: Negative for arthralgias, back pain and myalgias.  Skin: Negative for color change and rash.  Neurological: Positive for weakness. Negative for seizures, syncope, speech difficulty, light-headedness and headaches.  All other systems reviewed and are negative.    Physical Exam Updated Vital Signs  ED Triage Vitals  Enc Vitals Group     BP 04/23/18 1554 116/90     Pulse Rate 04/23/18 1554 70     Resp 04/23/18 1815 18     Temp 04/23/18 1554 98 F (36.7 C)     Temp Source 04/23/18 1554 Oral     SpO2 04/23/18 1554 98 %     Weight 04/23/18 1559 200 lb (90.7 kg)     Height 04/23/18 1559 6' (1.829 m)     Head Circumference --      Peak Flow --      Pain Score 04/23/18 1559 0     Pain Loc --      Pain Edu? --      Excl. in [place]? --     Physical Exam  Constitutional: He is oriented to person, place, and time. He has a sickly appearance.  HENT:  Head:  Normocephalic and atraumatic.  Nose: Nose normal.  Mouth/Throat: Oropharynx is clear and moist. No oropharyngeal exudate.  Eyes: Pupils are equal, round, and reactive to light. Conjunctivae and EOM are normal.  Neck: Normal range of motion. Neck supple.  Cardiovascular: Normal rate, regular rhythm, normal heart sounds and intact distal pulses.  No murmur heard. Pulmonary/Chest: Effort normal and breath sounds normal. No stridor. No respiratory distress.  Abdominal: Soft. He exhibits no distension. There is no tenderness.  Musculoskeletal: Normal range of motion. He exhibits no edema.  Neurological: He is alert and oriented to person, place, and time.  Skin: Skin is warm and dry. Capillary refill takes less than 2 seconds.  Psychiatric: He has a normal mood and affect.  Nursing note and vitals reviewed.    ED Treatments / Results  Labs (all labs ordered are listed, but only abnormal results are displayed) Labs Reviewed  LIPASE, BLOOD - Abnormal; Notable for the following components:      Result Value   Lipase 74 (*)    All other components within normal limits  COMPREHENSIVE METABOLIC PANEL - Abnormal; Notable for the following components:   Potassium 2.6 (*)    Chloride 93 (*)    Glucose, Bld 226 (*)    BUN 42 (*)    Creatinine, Ser 2.11 (*)    AST 52 (*)    Alkaline Phosphatase 157 (*)    Total Bilirubin 1.5 (*)    GFR calc non Af Amer 30 (*)    GFR calc Af Amer 34 (*)    All other components within normal limits  CBC - Abnormal; Notable for the following components:   WBC 16.1 (*)    All  other components within normal limits  C DIFFICILE QUICK SCREEN W PCR REFLEX  GASTROINTESTINAL PANEL BY PCR, STOOL (REPLACES STOOL CULTURE)  URINALYSIS, ROUTINE W REFLEX MICROSCOPIC  I-STAT TROPONIN, ED    EKG EKG Interpretation  Date/Time:  Monday April 23 2018 19:38:29 EDT Ventricular Rate:  60 PR Interval:    QRS Duration: 127 QT Interval:  457 QTC Calculation: 457 R  Axis:   -27 Text Interpretation:  Sinus rhythm Nonspecific intraventricular conduction delay Repol abnrm suggests ischemia, diffuse leads Confirmed by Lennice Sites [phone]) on 04/23/2018 7:50:27 PM   Radiology Dg Chest 2 View  Result Date: 04/23/2018 CLINICAL DATA:  Cough EXAM: CHEST - 2 VIEW COMPARISON:  08/30/2017 FINDINGS: The heart size and mediastinal contours are within normal limits. Both lungs are clear. Surgical clips in the right upper quadrant. Degenerative changes of the spine. IMPRESSION: No active cardiopulmonary disease. Electronically Signed   By: Donavan Foil M.D.   On: 04/23/2018 19:26   Ct Head Wo Contrast  Result Date: 04/23/2018 CLINICAL DATA:  Altered level of consciousness (LOC), unexplained. Patient reports multiple falls. EXAM: CT HEAD WITHOUT CONTRAST TECHNIQUE: Contiguous axial images were obtained from the base of the skull through the vertex without intravenous contrast. COMPARISON:  Head CT 04/06/2018 FINDINGS: Brain: Unchanged atrophy and chronic small vessel ischemia. Remote bilateral basal ganglia air and left thalamic lacunar infarct. No intracranial hemorrhage, mass effect, or midline shift. No hydrocephalus. The basilar cisterns are patent. No evidence of territorial infarct or acute ischemia. No extra-axial or intracranial fluid collection. Vascular: Atherosclerosis of skullbase vasculature without hyperdense vessel or abnormal calcification. Skull: No fracture or focal lesion. Sinuses/Orbits: Paranasal sinuses and mastoid air cells are clear. The visualized orbits are unremarkable. Other: None. IMPRESSION: 1.  No acute intracranial abnormality. 2. Unchanged advanced chronic small vessel ischemia, remote lacunar infarcts and generalized atrophy. Electronically Signed   By: Jeb Levering M.D.   On: 04/23/2018 21:43    Procedures .Critical Care Performed by: Lennice Sites, DO Authorized by: Lennice Sites, DO   Critical care provider statement:    Critical  care time (minutes):  30   Critical care was necessary to treat or prevent imminent or life-threatening deterioration of the following conditions:  Metabolic crisis and dehydration   Critical care was time spent personally by me on the following activities:  Development of treatment plan with patient or surrogate, discussions with primary provider, evaluation of patient's response to treatment, examination of patient, ordering and performing treatments and interventions, ordering and review of laboratory studies, ordering and review of radiographic studies, pulse oximetry, re-evaluation of patient's condition and review of old charts   I assumed direction of critical care for this patient from another provider in my specialty: no     (including critical care time)  Medications Ordered in ED Medications  potassium chloride 10 mEq in 100 mL IVPB (10 mEq Intravenous Transfusing/Transfer 04/23/18 2305)  lactated ringers bolus 1,000 mL (0 mLs Intravenous Stopped 04/23/18 2013)  potassium chloride 10 mEq in 100 mL IVPB (0 mEq Intravenous Stopped 04/23/18 2130)  ondansetron (ZOFRAN) injection 4 mg (4 mg Intravenous Given 04/23/18 1837)     Initial Impression / Assessment and Plan / ED Course  I have reviewed the triage vital signs and the nursing notes.  Pertinent labs & imaging results that were available during my care of the patient were reviewed by me and considered in my medical decision making (see chart for details).     John Blackburn is  a 72 year old male with history of high cholesterol, hypertension, CAD who presents to the ED with emesis, diarrhea, fatigue.  Patient with unremarkable vitals upon arrival.  No fever.  Patient with nausea, vomiting, diarrhea for about 2 weeks.  He denies any recent antibiotics, recent travel recent exposures.  He is overall ill-appearing.  He denies any chest pain, abdominal pain, shortness of breath.  Patient had EKG that showed ST depressions in V2 through  V5.  Patient had contiguous T wave inversions in these leads as well.  Posterior EKG did not show any reciprocal changes.  Dr. Gwenlyn Found with cardiology reviewed the EKGs as well and do not believe they are consistent with any STEMI equivalents.  Patient also without any chest pain.  Patient did have potassium of 2.6 which is likely causing some of these EKG changes as well.  Patient with no abdominal pain on exam.  Overall appears clinically dehydrated.  Basic labs are ordered.  Patient given IV fluids, IV Zofran.  Patient given IV potassium repletion.  Troponin within normal limits.  Will need troponins trended given T wave inversions.  Lipase overall unremarkable.  Patient with mild increase in creatinine and concern for AKI.  Likely from dehydration.  Hemoglobin unremarkable.  Mild leukocytosis likely from stress reaction.  CT scan of the head was overall unremarkable.  CT performed as patient has had multiple falls due to weakness. Neuro intact, no concern for stroke. Chest x-ray showed no signs of pneumonia, pneumothorax, pleural effusion.  Dr. Jonelle Sidle with medicine to admit the patient for further electrolyte repletion, troponins, hydration.  Hemodynamically stable throughout my care.  Final Clinical Impressions(s) / ED Diagnoses   Final diagnoses:  Nonspecific ST-T wave electrocardiographic changes  Nausea vomiting and diarrhea  AKI (acute kidney injury) [place])  Hypokalemia    ED Discharge Orders    None      Lennice Sites, DO 04/23/18 2324  Lennice Sites, DO 05/08/18 1123

## 2018-04-23 NOTE — ED Triage Notes (Signed)
Pt reports having nausea/vomiting/loss of appetite/diarrhea x 3 weeks, seen at PCP and told to come here. Pt reports multiple falls recently due to loss of balance. Pt denies abdominal pain.

## 2018-04-23 NOTE — ED Notes (Signed)
ED Provider at bedside. 

## 2018-04-23 NOTE — ED Notes (Addendum)
Patient transported to x-ray. ?

## 2018-04-23 NOTE — ED Notes (Addendum)
Patient transported to CT 

## 2018-04-23 NOTE — ED Notes (Signed)
RN attempted to collect stools sample. Pt has not produced BM/is clean. Will check later.

## 2018-04-24 DIAGNOSIS — N179 Acute kidney failure, unspecified: Principal | ICD-10-CM

## 2018-04-24 LAB — TSH: TSH: 0.47 u[IU]/mL (ref 0.350–4.500)

## 2018-04-24 LAB — URINALYSIS, ROUTINE W REFLEX MICROSCOPIC
BILIRUBIN URINE: NEGATIVE
Glucose, UA: NEGATIVE mg/dL
Hgb urine dipstick: NEGATIVE
KETONES UR: NEGATIVE mg/dL
Leukocytes, UA: NEGATIVE
NITRITE: NEGATIVE
PH: 5 (ref 5.0–8.0)
Protein, ur: NEGATIVE mg/dL
Specific Gravity, Urine: 1.017 (ref 1.005–1.030)

## 2018-04-24 LAB — COMPREHENSIVE METABOLIC PANEL
ALBUMIN: 3.2 g/dL — AB (ref 3.5–5.0)
ALK PHOS: 128 U/L — AB (ref 38–126)
ALT: 34 U/L (ref 0–44)
AST: 33 U/L (ref 15–41)
Anion gap: 9 (ref 5–15)
BILIRUBIN TOTAL: 1 mg/dL (ref 0.3–1.2)
BUN: 42 mg/dL — AB (ref 8–23)
CALCIUM: 8.5 mg/dL — AB (ref 8.9–10.3)
CO2: 27 mmol/L (ref 22–32)
CREATININE: 2.01 mg/dL — AB (ref 0.61–1.24)
Chloride: 100 mmol/L (ref 98–111)
GFR calc Af Amer: 36 mL/min — ABNORMAL LOW (ref >60)
GFR calc non Af Amer: 31 mL/min — ABNORMAL LOW (ref >60)
GLUCOSE: 182 mg/dL — AB (ref 70–99)
Potassium: 3 mmol/L — ABNORMAL LOW (ref 3.5–5.1)
Sodium: 136 mmol/L (ref 135–145)
TOTAL PROTEIN: 6.2 g/dL — AB (ref 6.5–8.1)

## 2018-04-24 LAB — CBC
HCT: 37.2 % — ABNORMAL LOW (ref 39.0–52.0)
HEMATOCRIT: 41 % (ref 39.0–52.0)
HEMOGLOBIN: 14.2 g/dL (ref 13.0–17.0)
Hemoglobin: 12.9 g/dL — ABNORMAL LOW (ref 13.0–17.0)
MCH: 33.3 pg (ref 26.0–34.0)
MCH: 33.4 pg (ref 26.0–34.0)
MCHC: 34.6 g/dL (ref 30.0–36.0)
MCHC: 34.7 g/dL (ref 30.0–36.0)
MCV: 96 fL (ref 78.0–100.0)
MCV: 96.4 fL (ref 78.0–100.0)
PLATELETS: 182 10*3/uL (ref 150–400)
Platelets: 208 10*3/uL (ref 150–400)
RBC: 4.27 MIL/uL (ref 4.22–5.81)
RBC: 3.86 MIL/uL — ABNORMAL LOW (ref 4.22–5.81)
RDW: 13.2 % (ref 11.5–15.5)
RDW: 13.2 % (ref 11.5–15.5)
WBC: 14.8 10*3/uL — ABNORMAL HIGH (ref 4.0–10.5)
WBC: 12.7 10*3/uL — ABNORMAL HIGH (ref 4.0–10.5)

## 2018-04-24 LAB — TROPONIN I
TROPONIN I: 0.03 ng/mL — AB (ref <0.03)
Troponin I: 0.03 ng/mL (ref <0.03)
Troponin I: <0.03 ng/mL (ref <0.03)

## 2018-04-24 LAB — CREATININE, SERUM
CREATININE: 2.11 mg/dL — AB (ref 0.61–1.24)
GFR calc Af Amer: 34 mL/min — ABNORMAL LOW (ref >60)
GFR calc non Af Amer: 30 mL/min — ABNORMAL LOW (ref >60)

## 2018-04-24 LAB — MAGNESIUM: Magnesium: 1.8 mg/dL (ref 1.7–2.4)

## 2018-04-24 MED ORDER — SODIUM CHLORIDE 0.9 % IV SOLN
INTRAVENOUS | Status: DC
  Administered 2018-04-24 (×2): via INTRAVENOUS

## 2018-04-24 MED ORDER — POTASSIUM CHLORIDE CRYS ER 20 MEQ PO TBCR
40.0000 meq | EXTENDED_RELEASE_TABLET | Freq: Every day | ORAL | Status: DC
  Administered 2018-04-25: 40 meq via ORAL
  Filled 2018-04-24: qty 2

## 2018-04-24 MED ORDER — POTASSIUM CHLORIDE CRYS ER 20 MEQ PO TBCR
40.0000 meq | EXTENDED_RELEASE_TABLET | Freq: Two times a day (BID) | ORAL | Status: AC
  Administered 2018-04-24 (×2): 40 meq via ORAL
  Filled 2018-04-24 (×2): qty 2

## 2018-04-24 NOTE — Progress Notes (Signed)
This nurse and charge nurse attempted to do in and out but was unsuccessful. Patient complaining of pain. Will page the provider. Troponin 0.03. Asymptomatic. provider notified.

## 2018-04-24 NOTE — Progress Notes (Signed)
Patient is having difficulty voiding from ED. Admitted about 3 hours ago. Bladder scan 452 cc. Provider on call paged. Order for in and out received.

## 2018-04-24 NOTE — Progress Notes (Signed)
Patient admitted from ED with N/V. No acting nauseas or vomiting at this time. Oriented to room and surroundings. Telemetry applied and CCMD called. Right hip bruise and swelling from a fall at home. Per Patient and transferring nurse informed MD in ED.

## 2018-04-24 NOTE — Progress Notes (Signed)
PROGRESS NOTE    John Blackburn  LPF:[id number] DOB: [birth year] DOA: 04/23/2018 PCP: Hoyt Koch, MD   Brief Narrative: Patient is a 72 year old male with past medical history of coronary disease, ischemic cardiomyopathy, hypertension, hyperlipidemia, GERD who presented with recurrent episodes of nausea, vomiting and loss of appetite from home.  Also noted to be mildly febrile at home.  Found to have acute kidney injury on presentation.  Found to have hypokalemia.  Assessment & Plan:   Principal Problem:   ARF (acute renal failure) (HCC) Active Problems:   Hypertension   Hyperlipidemia   Obstructive sleep apnea   History of pulmonary embolism   GERD (gastroesophageal reflux disease)   Hypokalemia   Dehydration   Anorexia   Leucocytosis   EKG abnormalities  Acute kidney injury: Most likely prerenal.  Patient had poor appetite at home.  Continue hydration.  We will continue to monitor his renal function.  Lasix, Tenoretic on hold.  Severe dehydration: Continue IV fluids  Hypokalemia: Continue aggressive supplementation.  Will check magnesium.  Most likely associated with poor oral intake.  Anorexia: Nutrition consulted.  History of coronary disease: Denies any chest pain.  Continue his home medications.  On aspirin and Plavix.  Mild elevated troponin/EKG changes: EKG shows T wave inversion in the anterior and lateral leads.  Patient denies any chest pain.  Troponins are flat.  We will continue to monitor.  Dementia: Continue supportive care.  GERD: Continue PPI  Hyperlipidemia: Continue Lipitor  Depression with anxiety: Continue Wellbutrin and Lexapro.  Debility/deconditioning: PT eval to the patient and recommended SNF.  Social worker consulted.    DVT prophylaxis: Heparin [place] Code Status: Full Family Communication: None present at the bedside Disposition Plan: Skilled nursing facility.  Might be ready tomorrow.   Consultants:  None  Procedures:None  Antimicrobials:None  Subjective: Seen and examined the bedside this morning.  Hemodynamically stable.  Complains of generalized weakness.  Very poor historian.  Appropriate information could not be gathered.  Objective: Vitals:   04/23/18 2245 04/24/18 0007 04/24/18 0415 04/24/18 0841  BP: 112/69 115/75 101/60 111/76  Pulse: (!) 57 (!) 55 (!) 52 (!) 51  Resp: 16 18 18 18   Temp:  98 F (36.7 C) 98.4 F (36.9 C) 97.6 F (36.4 C)  TempSrc:  Oral  Oral  SpO2: 100% 100% 100% 100%  Weight:  91.7 kg    Height:        Intake/Output Summary (Last 24 hours) at 04/24/2018 1342 Last data filed at 04/24/2018 1100 Gross per 24 hour  Intake 1912.17 ml  Output 450 ml  Net 1462.17 ml   Filed Weights   04/23/18 1559 04/24/18 0007  Weight: 90.7 kg 91.7 kg    Examination:  General exam: Not in distress, generalized weakness HEENT:PERRL,Oral mucosa moist, Ear/Nose normal on gross exam Respiratory system: Bilateral equal air entry, normal vesicular breath sounds, no wheezes or crackles  Cardiovascular system: S1 & S2 heard, RRR. No JVD, murmurs, rubs, gallops or clicks. No pedal edema. Gastrointestinal system: Abdomen is nondistended, soft and nontender. No organomegaly or masses felt. Normal bowel sounds heard. Central nervous system: Alert but not  oriented. No focal neurological deficits. Extremities: No edema, no clubbing ,no cyanosis, distal peripheral pulses palpable. Skin: No rashes, lesions or ulcers,no icterus ,no pallor MSK: Normal muscle bulk,tone ,power Psychiatry: Judgement and insight appear impaired   Data Reviewed: I have personally reviewed following labs and imaging studies  CBC: Recent Labs  Lab 04/23/18 1610 04/24/18 0131  04/24/18 0714  WBC 16.1* 14.8* 12.7*  HGB 15.3 14.2 12.9*  HCT 44.9 41.0 37.2*  MCV 96.8 96.0 96.4  PLT 253 208 161   Basic Metabolic Panel: Recent Labs  Lab 04/23/18 1610 04/24/18 0131 04/24/18 0714  NA 138   --  136  K 2.6*  --  3.0*  CL 93*  --  100  CO2 31  --  27  GLUCOSE 226*  --  182*  BUN 42*  --  42*  CREATININE 2.11* 2.11* 2.01*  CALCIUM 9.6  --  8.5*   GFR: Estimated Creatinine Clearance: 36.5 mL/min (A) (by C-G formula based on SCr of 2.01 mg/dL (H)). Liver Function Tests: Recent Labs  Lab 04/23/18 1610 04/24/18 0714  AST 52* 33  ALT 42 34  ALKPHOS 157* 128*  BILITOT 1.5* 1.0  PROT 7.4 6.2*  ALBUMIN 3.9 3.2*   Recent Labs  Lab 04/23/18 1610  LIPASE 74*   No results for input(s): AMMONIA in the last 168 hours. Coagulation Profile: No results for input(s): INR, PROTIME in the last 168 hours. Cardiac Enzymes: Recent Labs  Lab 04/24/18 0131 04/24/18 0714  TROPONINI 0.03* 0.03*   BNP (last 3 results) No results for input(s): PROBNP in the last 8760 hours. HbA1C: No results for input(s): HGBA1C in the last 72 hours. CBG: No results for input(s): GLUCAP in the last 168 hours. Lipid Profile: No results for input(s): CHOL, HDL, LDLCALC, TRIG, CHOLHDL, LDLDIRECT in the last 72 hours. Thyroid Function Tests: Recent Labs    04/24/18 0131  TSH 0.470   Anemia Panel: No results for input(s): VITAMINB12, FOLATE, FERRITIN, TIBC, IRON, RETICCTPCT in the last 72 hours. Sepsis Labs: No results for input(s): PROCALCITON, LATICACIDVEN in the last 168 hours.  No results found for this or any previous visit (from the past 240 hour(s)).       Radiology Studies: Dg Chest 2 View  Result Date: 04/23/2018 CLINICAL DATA:  Cough EXAM: CHEST - 2 VIEW COMPARISON:  08/30/2017 FINDINGS: The heart size and mediastinal contours are within normal limits. Both lungs are clear. Surgical clips in the right upper quadrant. Degenerative changes of the spine. IMPRESSION: No active cardiopulmonary disease. Electronically Signed   By: Donavan Foil M.D.   On: 04/23/2018 19:26   Ct Head Wo Contrast  Result Date: 04/23/2018 CLINICAL DATA:  Altered level of consciousness (LOC),  unexplained. Patient reports multiple falls. EXAM: CT HEAD WITHOUT CONTRAST TECHNIQUE: Contiguous axial images were obtained from the base of the skull through the vertex without intravenous contrast. COMPARISON:  Head CT 04/06/2018 FINDINGS: Brain: Unchanged atrophy and chronic small vessel ischemia. Remote bilateral basal ganglia air and left thalamic lacunar infarct. No intracranial hemorrhage, mass effect, or midline shift. No hydrocephalus. The basilar cisterns are patent. No evidence of territorial infarct or acute ischemia. No extra-axial or intracranial fluid collection. Vascular: Atherosclerosis of skullbase vasculature without hyperdense vessel or abnormal calcification. Skull: No fracture or focal lesion. Sinuses/Orbits: Paranasal sinuses and mastoid air cells are clear. The visualized orbits are unremarkable. Other: None. IMPRESSION: 1.  No acute intracranial abnormality. 2. Unchanged advanced chronic small vessel ischemia, remote lacunar infarcts and generalized atrophy. Electronically Signed   By: Jeb Levering M.D.   On: 04/23/2018 21:43        Scheduled Meds: . aspirin EC  81 mg Oral Daily  . atorvastatin  80 mg Oral q1800  . buPROPion  300 mg Oral Daily  . clopidogrel  75 mg Oral Daily  .  escitalopram  10 mg Oral Daily  . heparin  5,000 Units Subcutaneous Q8H  . memantine  7 mg Oral Daily  . pantoprazole  40 mg Oral Daily  . potassium chloride  40 mEq Oral BID  . [START ON 04/25/2018] potassium chloride SA  40 mEq Oral Daily   Continuous Infusions: . sodium chloride 100 mL/hr at 04/24/18 1052     LOS: 1 day    Time spent: More than 50% of that time was spent in counseling and/or coordination of care.      Shelly Coss, MD [facility] Pager [phone]  If 7PM-7AM, please contact night-coverage [email or web address] Password [email or web address] 04/24/2018, 1:42 PM

## 2018-04-24 NOTE — Progress Notes (Signed)
CRITICAL VALUE ALERT  Critical Value:  Troponin 0.03  Date & Time Notied:  04/24/2018. 0330  Provider Notified: Baltazar Najjar  Orders Received/Actions taken: No new orders

## 2018-04-24 NOTE — Evaluation (Signed)
Physical Therapy Evaluation Patient Details Name: John Blackburn MRN: [id number] DOB: 1946/01/18 Today's Date: 04/24/2018   History of Present Illness  Pt is a 72 y/o male admitted secondary to prolonged nausea/vomiting and anorexia. Pt also found to be dehydrated and with ARF. PMH includes OSA, PE, CAD, ischemic cardiomyopathy, HTN, and dementia.   Clinical Impression  Pt admitted secondary to problem above with deficits below. Pt presenting with increased weakness and decreased balance. Pt also reports history of multiple falls. Required min A with use of RW to transfer to chair this session. Pt currently lives alone and feel pt is at increased risk for falls. Will continue to follow acutely to maximize functional mobility independence and safety.     Follow Up Recommendations SNF;Supervision/Assistance - 24 hour    Equipment Recommendations  None recommended by PT    Recommendations for Other Services       Precautions / Restrictions Precautions Precautions: Fall Precaution Comments: Pt reporting 3-4 falls within the past couple of weeks.  Restrictions Weight Bearing Restrictions: No      Mobility  Bed Mobility Overal bed mobility: Needs Assistance Bed Mobility: Supine to Sit     Supine to sit: Min assist     General bed mobility comments: Min A for trunk elevation. Increased time required to come to EOB.   Transfers Overall transfer level: Needs assistance Equipment used: Rolling walker (2 wheeled) Transfers: Sit to/from Stand Sit to Stand: Min assist         General transfer comment: Min A for steadying assist to stand with RW. Required cues for safe hand placement. Pt able to take pivotal steps to RW during stand pivot transfer with use of RW, however, pt unsteady requiring min A. Pt also required cues for safe use of RW.   Ambulation/Gait             General Gait Details: deferred   Stairs            Wheelchair Mobility    Modified Rankin  (Stroke Patients Only)       Balance Overall balance assessment: Needs assistance Sitting-balance support: No upper extremity supported;Feet supported Sitting balance-Leahy Scale: Fair     Standing balance support: Bilateral upper extremity supported;During functional activity Standing balance-Leahy Scale: Poor Standing balance comment: Reliant on BUE support                              Pertinent Vitals/Pain Pain Assessment: No/denies pain    Home Living Family/patient expects to be discharged to:: Private residence Living Arrangements: Alone Available Help at Discharge: Family;Available PRN/intermittently Type of Home: House Home Access: Level entry     Home Layout: One level Home Equipment: Walker - 2 wheels      Prior Function Level of Independence: Independent         Comments: Reports he had been staying in the bed most of the time, however, reports he did not use AD for ambulation.      Hand Dominance        Extremity/Trunk Assessment   Upper Extremity Assessment Upper Extremity Assessment: Generalized weakness    Lower Extremity Assessment Lower Extremity Assessment: Generalized weakness    Cervical / Trunk Assessment Cervical / Trunk Assessment: Kyphotic  Communication   Communication: No difficulties  Cognition Arousal/Alertness: Awake/alert Behavior During Therapy: WFL for tasks assessed/performed Overall Cognitive Status: History of cognitive impairments - at baseline  General Comments: Pt disoriented to situation. Per notes, pt with dementia, however, unsure of pt's baseline.       General Comments      Exercises     Assessment/Plan    PT Assessment Patient needs continued PT services  PT Problem List Decreased strength;Decreased balance;Decreased mobility;Decreased knowledge of use of DME;Decreased safety awareness;Decreased knowledge of precautions       PT Treatment  Interventions DME instruction;Gait training;Functional mobility training;Therapeutic activities;Therapeutic exercise;Balance training;Patient/family education    PT Goals (Current goals can be found in the Care Plan section)  Acute Rehab PT Goals Patient Stated Goal: "to feel better"  PT Goal Formulation: With patient Time For Goal Achievement: 05/08/18 Potential to Achieve Goals: Good    Frequency Min 2X/week   Barriers to discharge Decreased caregiver support      Co-evaluation               AM-PAC PT "6 Clicks" Daily Activity  Outcome Measure Difficulty turning over in bed (including adjusting bedclothes, sheets and blankets)?: A Little Difficulty moving from lying on back to sitting on the side of the bed? : Unable Difficulty sitting down on and standing up from a chair with arms (e.g., wheelchair, bedside commode, etc,.)?: Unable Help needed moving to and from a bed to chair (including a wheelchair)?: A Little Help needed walking in hospital room?: A Little Help needed climbing 3-5 steps with a railing? : A Lot 6 Click Score: 13    End of Session Equipment Utilized During Treatment: Gait belt Activity Tolerance: Patient tolerated treatment well Patient left: in chair;with call bell/phone within reach;with chair alarm set Nurse Communication: Mobility status PT Visit Diagnosis: Unsteadiness on feet (R26.81);Muscle weakness (generalized) (M62.81);History of falling (Z91.81);Repeated falls (R29.6);Difficulty in walking, not elsewhere classified (R26.2)    Time: 8088-1103 PT Time Calculation (min) (ACUTE ONLY): 13 min   Charges:   PT Evaluation $PT Eval Low Complexity: 1 Low          Leighton Ruff, PT, DPT  Acute Rehabilitation Services  Pager: [phone]   Rudean Hitt 04/24/2018, 1:36 PM

## 2018-04-24 NOTE — Plan of Care (Signed)
  Problem: Education: Goal: Knowledge of General Education information will improve Description: Including pain rating scale, medication(s)/side effects and non-pharmacologic comfort measures Outcome: Progressing   Problem: Pain Managment: Goal: General experience of comfort will improve Outcome: Progressing   

## 2018-04-25 DIAGNOSIS — M6281 Muscle weakness (generalized): Secondary | ICD-10-CM | POA: Diagnosis not present

## 2018-04-25 DIAGNOSIS — Z86711 Personal history of pulmonary embolism: Secondary | ICD-10-CM | POA: Diagnosis not present

## 2018-04-25 DIAGNOSIS — I2510 Atherosclerotic heart disease of native coronary artery without angina pectoris: Secondary | ICD-10-CM | POA: Diagnosis not present

## 2018-04-25 DIAGNOSIS — E860 Dehydration: Secondary | ICD-10-CM | POA: Diagnosis not present

## 2018-04-25 DIAGNOSIS — K219 Gastro-esophageal reflux disease without esophagitis: Secondary | ICD-10-CM | POA: Diagnosis not present

## 2018-04-25 DIAGNOSIS — R630 Anorexia: Secondary | ICD-10-CM | POA: Diagnosis not present

## 2018-04-25 DIAGNOSIS — E785 Hyperlipidemia, unspecified: Secondary | ICD-10-CM | POA: Diagnosis not present

## 2018-04-25 DIAGNOSIS — R278 Other lack of coordination: Secondary | ICD-10-CM | POA: Diagnosis not present

## 2018-04-25 DIAGNOSIS — F321 Major depressive disorder, single episode, moderate: Secondary | ICD-10-CM | POA: Diagnosis not present

## 2018-04-25 DIAGNOSIS — F419 Anxiety disorder, unspecified: Secondary | ICD-10-CM | POA: Diagnosis not present

## 2018-04-25 DIAGNOSIS — L859 Epidermal thickening, unspecified: Secondary | ICD-10-CM | POA: Diagnosis not present

## 2018-04-25 DIAGNOSIS — G4733 Obstructive sleep apnea (adult) (pediatric): Secondary | ICD-10-CM | POA: Diagnosis not present

## 2018-04-25 DIAGNOSIS — I509 Heart failure, unspecified: Secondary | ICD-10-CM | POA: Diagnosis not present

## 2018-04-25 DIAGNOSIS — F322 Major depressive disorder, single episode, severe without psychotic features: Secondary | ICD-10-CM | POA: Diagnosis not present

## 2018-04-25 DIAGNOSIS — N179 Acute kidney failure, unspecified: Secondary | ICD-10-CM | POA: Diagnosis not present

## 2018-04-25 DIAGNOSIS — F0390 Unspecified dementia without behavioral disturbance: Secondary | ICD-10-CM | POA: Diagnosis not present

## 2018-04-25 DIAGNOSIS — R2689 Other abnormalities of gait and mobility: Secondary | ICD-10-CM | POA: Diagnosis not present

## 2018-04-25 DIAGNOSIS — N178 Other acute kidney failure: Secondary | ICD-10-CM | POA: Diagnosis not present

## 2018-04-25 DIAGNOSIS — I10 Essential (primary) hypertension: Secondary | ICD-10-CM | POA: Diagnosis not present

## 2018-04-25 DIAGNOSIS — R41841 Cognitive communication deficit: Secondary | ICD-10-CM | POA: Diagnosis not present

## 2018-04-25 DIAGNOSIS — I110 Hypertensive heart disease with heart failure: Secondary | ICD-10-CM | POA: Diagnosis not present

## 2018-04-25 DIAGNOSIS — E46 Unspecified protein-calorie malnutrition: Secondary | ICD-10-CM | POA: Diagnosis not present

## 2018-04-25 DIAGNOSIS — R531 Weakness: Secondary | ICD-10-CM | POA: Diagnosis not present

## 2018-04-25 LAB — BASIC METABOLIC PANEL
Anion gap: 6 (ref 5–15)
BUN: 26 mg/dL — ABNORMAL HIGH (ref 8–23)
CHLORIDE: 105 mmol/L (ref 98–111)
CO2: 27 mmol/L (ref 22–32)
CREATININE: 1.33 mg/dL — AB (ref 0.61–1.24)
Calcium: 8.1 mg/dL — ABNORMAL LOW (ref 8.9–10.3)
GFR, EST NON AFRICAN AMERICAN: 52 mL/min — AB (ref >60)
Glucose, Bld: 114 mg/dL — ABNORMAL HIGH (ref 70–99)
POTASSIUM: 3.2 mmol/L — AB (ref 3.5–5.1)
SODIUM: 138 mmol/L (ref 135–145)

## 2018-04-25 LAB — CBC WITH DIFFERENTIAL/PLATELET
ABS IMMATURE GRANULOCYTES: 0 10*3/uL (ref 0.0–0.1)
BASOS ABS: 0 10*3/uL (ref 0.0–0.1)
Basophils Relative: 1 %
Eosinophils Absolute: 0.3 10*3/uL (ref 0.0–0.7)
Eosinophils Relative: 4 %
HCT: 32.8 % — ABNORMAL LOW (ref 39.0–52.0)
Hemoglobin: 11.1 g/dL — ABNORMAL LOW (ref 13.0–17.0)
Immature Granulocytes: 0 %
LYMPHS PCT: 20 %
Lymphs Abs: 1.7 10*3/uL (ref 0.7–4.0)
MCH: 32.9 pg (ref 26.0–34.0)
MCHC: 33.8 g/dL (ref 30.0–36.0)
MCV: 97.3 fL (ref 78.0–100.0)
Monocytes Absolute: 0.5 10*3/uL (ref 0.1–1.0)
Monocytes Relative: 6 %
NEUTROS ABS: 6.1 10*3/uL (ref 1.7–7.7)
Neutrophils Relative %: 69 %
PLATELETS: 155 10*3/uL (ref 150–400)
RBC: 3.37 MIL/uL — AB (ref 4.22–5.81)
RDW: 13.2 % (ref 11.5–15.5)
WBC: 8.6 10*3/uL (ref 4.0–10.5)

## 2018-04-25 LAB — TROPONIN I

## 2018-04-25 MED ORDER — POTASSIUM CHLORIDE CRYS ER 20 MEQ PO TBCR: 20.0000 meq | EXTENDED_RELEASE_TABLET | Freq: Every day | ORAL | 3 refills | Status: DC

## 2018-04-25 MED ORDER — ENSURE ENLIVE PO LIQD: 237.0000 mL | Freq: Two times a day (BID) | ORAL | Status: DC

## 2018-04-25 MED ORDER — POTASSIUM CHLORIDE CRYS ER 20 MEQ PO TBCR
40.0000 meq | EXTENDED_RELEASE_TABLET | Freq: Once | ORAL | Status: AC
  Administered 2018-04-25: 40 meq via ORAL
  Filled 2018-04-25: qty 2

## 2018-04-25 NOTE — Progress Notes (Signed)
Patient left via wheelchair with son. IVs were taken out. Patient belongings taken with  patient. Telemetry taken off and signed back in. Report given to RN at [facility] AVS given to patient and son.   Farley Ly RN

## 2018-04-25 NOTE — Progress Notes (Signed)
Physical Therapy Treatment Patient Details Name: John Blackburn MRN: [id number] DOB: 1946-05-27 Today's Date: 04/25/2018    History of Present Illness Pt is a 72 y/o male admitted secondary to prolonged nausea/vomiting and anorexia. Pt also found to be dehydrated and with ARF. PMH includes OSA, PE, CAD, ischemic cardiomyopathy, HTN, and dementia.     PT Comments    Pt progressing towards goals, however, limited secondary to fatigue. Required min to min guard A for mobility using RW secondary to unsteadiness. Required safety cues for use of RW throughout gait. Pt also performed supine HEP. Current recommendations appropriate. Will continue to follow acutely to maximize functional mobility independence and safety.    Follow Up Recommendations  SNF;Supervision/Assistance - 24 hour     Equipment Recommendations  None recommended by PT    Recommendations for Other Services       Precautions / Restrictions Precautions Precautions: Fall Precaution Comments: Pt reporting 3-4 falls within the past couple of weeks.  Restrictions Weight Bearing Restrictions: No    Mobility  Bed Mobility Overal bed mobility: Needs Assistance Bed Mobility: Supine to Sit;Sit to Supine     Supine to sit: Min assist Sit to supine: Supervision   General bed mobility comments: Min A for trunk elevation to come to sitting. Supervision to return to supine.   Transfers Overall transfer level: Needs assistance Equipment used: Rolling walker (2 wheeled) Transfers: Sit to/from Stand Sit to Stand: Min assist;From elevated surface         General transfer comment: Min A for lift assist and steadying. Verbal cues for safe hand placement.   Ambulation/Gait Ambulation/Gait assistance: Min guard;Min assist Gait Distance (Feet): 20 Feet Assistive device: Rolling walker (2 wheeled) Gait Pattern/deviations: Step-through pattern;Decreased stride length;Trunk flexed Gait velocity: Decreased    General Gait  Details: Slow, mildly unsteady gait requiring min guard to min A for steadying. Verbal cues for sequencing using RW. Distance limited secondary to fatigue.    Stairs             Wheelchair Mobility    Modified Rankin (Stroke Patients Only)       Balance Overall balance assessment: Needs assistance Sitting-balance support: No upper extremity supported;Feet supported Sitting balance-Leahy Scale: Fair     Standing balance support: Bilateral upper extremity supported;During functional activity Standing balance-Leahy Scale: Poor Standing balance comment: Reliant on BUE support                             Cognition Arousal/Alertness: Awake/alert Behavior During Therapy: WFL for tasks assessed/performed Overall Cognitive Status: History of cognitive impairments - at baseline                                 General Comments: Pt with difficulty remembering past occupations, etc. Required extended time to process information.       Exercises General Exercises - Lower Extremity Ankle Circles/Pumps: AROM;Both;20 reps Quad Sets: AROM;Both;10 reps Heel Slides: AROM;Both;10 reps Hip ABduction/ADduction: AROM;Both;10 reps    General Comments        Pertinent Vitals/Pain Pain Assessment: No/denies pain    Home Living                      Prior Function            PT Goals (current goals can now be found in the care plan section)  Acute Rehab PT Goals Patient Stated Goal: "to feel better"  PT Goal Formulation: With patient Time For Goal Achievement: 05/08/18 Potential to Achieve Goals: Good Progress towards PT goals: Progressing toward goals    Frequency    Min 2X/week      PT Plan Current plan remains appropriate    Co-evaluation              AM-PAC PT "6 Clicks" Daily Activity  Outcome Measure  Difficulty turning over in bed (including adjusting bedclothes, sheets and blankets)?: A Little Difficulty moving from  lying on back to sitting on the side of the bed? : Unable Difficulty sitting down on and standing up from a chair with arms (e.g., wheelchair, bedside commode, etc,.)?: Unable Help needed moving to and from a bed to chair (including a wheelchair)?: A Little Help needed walking in hospital room?: A Little Help needed climbing 3-5 steps with a railing? : A Lot 6 Click Score: 13    End of Session Equipment Utilized During Treatment: Gait belt Activity Tolerance: Patient limited by fatigue Patient left: in bed;with call bell/phone within reach;with bed alarm set;Other (comment)(dietician in room ) Nurse Communication: Mobility status PT Visit Diagnosis: Unsteadiness on feet (R26.81);Muscle weakness (generalized) (M62.81);History of falling (Z91.81);Repeated falls (R29.6);Difficulty in walking, not elsewhere classified (R26.2)     Time: 1610-9604 PT Time Calculation (min) (ACUTE ONLY): 18 min  Charges:  $Therapeutic Activity: 8-22 mins                     Leighton Ruff, PT, DPT  Acute Rehabilitation Services  Pager: [phone]    Rudean Hitt 04/25/2018, 12:06 PM

## 2018-04-25 NOTE — NC FL2 (Signed)
[place] LEVEL OF CARE SCREENING TOOL     IDENTIFICATION  Patient Name: John Blackburn Birthdate: 1946-08-18 Sex: male Admission Date (Current Location): 04/23/2018  [facility] and [place] Number:  Herbalist and Address:  [facility] [place] [address] [place], [place] [address]      Provider Number: [id number]  Attending Physician Name and Address:  Shelly Coss, MD  Relative Name and Phone Number:  [place] - son, [phone]    Current Level of Care: Hospital Recommended Level of Care: [facility] Prior Approval Number:    Date Approved/Denied:   PASRR Number: (Submitted for PASRR 8/21 - MUST FB#[id number])  Discharge Plan: SNF    Current Diagnoses: Patient Active Problem List   Diagnosis Date Noted  . ARF (acute renal failure) ([place]) 04/23/2018  . Hypokalemia 04/23/2018  . Dehydration 04/23/2018  . Anorexia 04/23/2018  . Leucocytosis 04/23/2018  . EKG abnormalities 04/23/2018  . Nausea and vomiting 04/12/2018  . B12 deficiency 04/12/2018  . Memory loss 11/28/2017  . Balance problem 11/28/2017  . skin cancer   . Sleep apnea, primary central   . GERD (gastroesophageal reflux disease)   . MDD (major depressive disorder), single episode, moderate ([place])   . CAD S/P percutaneous coronary angioplasty 09/02/2017  . Acute congestive heart failure ([place]) 08/30/2017  . Elevated blood sugar 01/17/2017  . Incontinence of feces   . Allergic rhinitis 10/15/2013  . Chronic venous insufficiency 07/20/2013  . History of pulmonary embolism 07/08/2013  . ED (erectile dysfunction) 06/01/2011  . Arthritis   . Hypertension   . Hyperlipidemia   . Obstructive sleep apnea     Orientation RESPIRATION BLADDER Height & Weight     Self, Time, Situation, Place  Normal Continent Weight: 202 lb 2.6 oz (91.7 kg) Height:  6' (182.9 cm)  BEHAVIORAL SYMPTOMS/MOOD NEUROLOGICAL BOWEL NUTRITION STATUS      Continent  Diet(Low sodium - Heart healthy)  AMBULATORY STATUS COMMUNICATION OF NEEDS Skin   Limited Assist(Min Guard/Min Assist) Verbally Other (Comment)(Ecchymosis bilateral legs)                       Personal Care Assistance Level of Assistance  Bathing, Feeding, Dressing Bathing Assistance: Limited assistance Feeding assistance: Independent Dressing Assistance: Limited assistance     Functional Limitations Info  Sight, Hearing, Speech Sight Info: Impaired(Wears reading glasses) Hearing Info: Adequate Speech Info: Adequate    SPECIAL CARE FACTORS FREQUENCY  PT (By licensed PT)     PT Frequency: Evaluated 8/20 and a minimum of 2X per week therapy recommended during acute inpatient stay              Contractures Contractures Info: Not present    Additional Factors Info  Code Status, Allergies, Isolation Precautions Code Status Info: Full Allergies Info: Cephalexin, Levofloxacin     Isolation Precautions Info: Loose, watery stools - Enteric precautions     Current Medications (04/25/2018):  This is the current hospital active medication list Current Facility-Administered Medications  Medication Dose Route Frequency Provider Last Rate Last Dose  . 0.9 %  sodium chloride infusion   Intravenous Continuous Shelly Coss, MD 100 mL/hr at 04/24/18 2101    . aspirin EC tablet 81 mg  81 mg Oral Daily Gala Romney L, MD   81 mg at 04/25/18 1008  . atorvastatin (LIPITOR) tablet 80 mg  80 mg Oral q1800 Elwyn Reach, MD   80 mg at  04/24/18 1812  . buPROPion (WELLBUTRIN XL) 24 hr tablet 300 mg  300 mg Oral Daily Gala Romney L, MD   300 mg at 04/25/18 1008  . clopidogrel (PLAVIX) tablet 75 mg  75 mg Oral Daily Gala Romney L, MD   75 mg at 04/25/18 1008  . escitalopram (LEXAPRO) tablet 10 mg  10 mg Oral Daily Elwyn Reach, MD   10 mg at 04/25/18 1008  . feeding supplement (ENSURE ENLIVE) (ENSURE ENLIVE) liquid 237 mL  237 mL Oral BID BM Adhikari, Amrit, MD      .  heparin injection 5,000 Units  5,000 Units Subcutaneous Q8H Elwyn Reach, MD   5,000 Units at 04/25/18 0513  . loratadine (CLARITIN) tablet 10 mg  10 mg Oral Daily PRN Elwyn Reach, MD      . Melatonin TABS 3 mg  3 mg Oral QHS PRN Elwyn Reach, MD   3 mg at 04/24/18 0059  . memantine (NAMENDA XR) 24 hr capsule 7 mg  7 mg Oral Daily Gala Romney L, MD   7 mg at 04/25/18 1008  . nitroGLYCERIN (NITROSTAT) SL tablet 0.4 mg  0.4 mg Sublingual Q5 min PRN Gala Romney L, MD      . pantoprazole (PROTONIX) EC tablet 40 mg  40 mg Oral Daily Gala Romney L, MD   40 mg at 04/25/18 1009  . potassium chloride SA (K-DUR,KLOR-CON) CR tablet 40 mEq  40 mEq Oral Daily Shelly Coss, MD   40 mEq at 04/25/18 1008     Discharge Medications: Please see discharge summary for a list of discharge medications.  Relevant Imaging Results:  Relevant Lab Results:   Additional Information ss#[id number].  Sable Feil, LCSW

## 2018-04-25 NOTE — Clinical Social Work Placement (Signed)
   CLINICAL SOCIAL WORK PLACEMENT  NOTE 04/25/18 - DISCHARGED TO [facility] VIA FAMILY  Date:  04/25/2018  Patient Details  Name: John Blackburn MRN: [id number] Date of Birth: [birth year]  Clinical Social Work is seeking post-discharge placement for this patient at the [place] level of care (*CSW will initial, date and re-position this form in  chart as items are completed):  Yes   Patient/family provided with [place] Work Department's list of facilities offering this level of care within the geographic area requested by the patient (or if unable, by the patient's family).  Yes   Patient/family informed of their freedom to choose among providers that offer the needed level of care, that participate in Medicare, Medicaid or managed care program needed by the patient, have an available bed and are willing to accept the patient.  Yes   Patient/family informed of [facility]'s ownership interest in [facility] and [facility], as well as of the fact that they are under no obligation to receive care at these facilities.  PASRR submitted to EDS on 04/25/18     PASRR number received on 04/25/18     Existing PASRR number confirmed on       FL2 transmitted to all facilities in geographic area requested by pt/family on 04/25/18     FL2 transmitted to all facilities within larger geographic area on       Patient informed that his/her managed care company has contracts with or will negotiate with certain facilities, including the following:        Yes   Patient/family informed of bed offers received.  Patient chooses bed at [facility]     Physician recommends and patient chooses bed at      Patient to be transferred to [facility] on 04/25/18.  Patient to be transferred to facility by Son via his vehicle     Patient family notified on 04/25/18 of transfer.  Name of family member notified:  Son  John Blackburn     PHYSICIAN       Additional Comment:    _______________________________________________ Sable Feil, LCSW 04/25/2018, 5:34 PM

## 2018-04-25 NOTE — Evaluation (Signed)
Occupational Therapy Evaluation Patient Details Name: John Blackburn MRN: [id number] DOB: March 04, 1946 Today's Date: 04/25/2018    History of Present Illness Pt is a 72 y/o male admitted secondary to prolonged nausea/vomiting and anorexia. Pt also found to be dehydrated and with ARF. PMH includes OSA, PE, CAD, ischemic cardiomyopathy, HTN, and dementia.    Clinical Impression   This 72 y/o male presents with the above. Pt reports he lives alone, reports at baseline he is independent with ADLs and functional mobility. Pt presenting with generalized weakness and decreased activity tolerance. Pt requiring minA for room level mobility using RW; currently requires minguard assist for seated UB ADL, modA for LB ADLs. Pt pleasant and willing to work with therapy, but fatigued with brief activity. Pt will benefit from continued acute OT services and recommend follow up therapy services in SNF setting after discharge to maximize his overall safety and independence with ADLs and mobility. Will follow.     Follow Up Recommendations  SNF;Supervision/Assistance - 24 hour    Equipment Recommendations  3 in 1 bedside commode;Other (comment)(TBD in next venue )           Precautions / Restrictions Precautions Precautions: Fall Precaution Comments: Pt reporting 3-4 falls within the past couple of weeks.  Restrictions Weight Bearing Restrictions: No      Mobility Bed Mobility Overal bed mobility: Needs Assistance Bed Mobility: Supine to Sit;Sit to Supine     Supine to sit: Min guard Sit to supine: Min guard   General bed mobility comments: minguard for safety, no physical assist required  Transfers Overall transfer level: Needs assistance Equipment used: Rolling walker (2 wheeled) Transfers: Sit to/from Stand Sit to Stand: Min assist         General transfer comment: Min A for lift assist and steadying. Verbal cues for safe hand placement.     Balance Overall balance assessment:  Needs assistance Sitting-balance support: No upper extremity supported;Feet supported Sitting balance-Leahy Scale: Fair     Standing balance support: Bilateral upper extremity supported;During functional activity Standing balance-Leahy Scale: Poor Standing balance comment: Reliant on BUE support                            ADL either performed or assessed with clinical judgement   ADL Overall ADL's : Needs assistance/impaired Eating/Feeding: Supervision/ safety;Sitting   Grooming: Set up;Sitting;Min guard   Upper Body Bathing: Min guard;Sitting   Lower Body Bathing: Minimal assistance;Sit to/from stand   Upper Body Dressing : Min guard;Sitting   Lower Body Dressing: Moderate assistance;Sit to/from stand   Toilet Transfer: Minimal assistance;Ambulation;Regular Toilet;RW Toilet Transfer Details (indicate cue type and reason): simulated in transfer to/from EOB  Toileting- Clothing Manipulation and Hygiene: Minimal assistance;Sit to/from stand       Functional mobility during ADLs: Minimal assistance;Rolling walker General ADL Comments: pt fatigued with room level activity      Vision         Perception     Praxis      Pertinent Vitals/Pain Pain Assessment: No/denies pain     Hand Dominance     Extremity/Trunk Assessment Upper Extremity Assessment Upper Extremity Assessment: Generalized weakness   Lower Extremity Assessment Lower Extremity Assessment: Defer to PT evaluation   Cervical / Trunk Assessment Cervical / Trunk Assessment: Kyphotic   Communication Communication Communication: No difficulties   Cognition Arousal/Alertness: Awake/alert Behavior During Therapy: WFL for tasks assessed/performed Overall Cognitive Status: History of cognitive impairments -  at baseline                                 General Comments: Pt required extended time to process information. Pt with family members in room and noted pt often joking with  therapist and family, appearing somewhat to cover up pt's confusion.    General Comments       Exercises Exercises: General Lower Extremity General Exercises - Lower Extremity Ankle Circles/Pumps: AROM;Both;20 reps Quad Sets: AROM;Both;10 reps Heel Slides: AROM;Both;10 reps Hip ABduction/ADduction: AROM;Both;10 reps   Shoulder Instructions      Home Living Family/patient expects to be discharged to:: Private residence Living Arrangements: Alone Available Help at Discharge: Family;Available PRN/intermittently Type of Home: House Home Access: Level entry     Home Layout: One level     Bathroom Shower/Tub: Teacher, early years/pre: Standard     Home Equipment: Walker - 2 wheels          Prior Functioning/Environment Level of Independence: Independent        Comments: Reports he had been staying in the bed most of the time, however, reports he did not use AD for ambulation.         OT Problem List: Decreased strength;Impaired balance (sitting and/or standing);Decreased range of motion      OT Treatment/Interventions: Self-care/ADL training;DME and/or AE instruction;Therapeutic activities;Balance training;Therapeutic exercise;Patient/family education;Energy conservation    OT Goals(Current goals can be found in the care plan section) Acute Rehab OT Goals Patient Stated Goal: "to feel better"  OT Goal Formulation: With patient Time For Goal Achievement: 05/09/18 Potential to Achieve Goals: Good  OT Frequency: Min 2X/week   Barriers to D/C:            Co-evaluation              AM-PAC PT "6 Clicks" Daily Activity     Outcome Measure Help from another person eating meals?: None Help from another person taking care of personal grooming?: A Little Help from another person toileting, which includes using toliet, bedpan, or urinal?: A Lot Help from another person bathing (including washing, rinsing, drying)?: A Lot Help from another person to  put on and taking off regular upper body clothing?: None Help from another person to put on and taking off regular lower body clothing?: A Lot 6 Click Score: 17   End of Session Equipment Utilized During Treatment: Gait belt;Rolling walker Nurse Communication: Mobility status  Activity Tolerance: Patient tolerated treatment well Patient left: in bed;with call bell/phone within reach;with bed alarm set;with family/visitor present  OT Visit Diagnosis: Muscle weakness (generalized) (M62.81)                Time: 5176-1607 OT Time Calculation (min): 18 min Charges:  OT General Charges $OT Visit: 1 Visit OT Evaluation $OT Eval Moderate Complexity: 1 Mod  Lou Cal, OT Pager [phone] 04/25/2018   Raymondo Band 04/25/2018, 3:40 PM

## 2018-04-25 NOTE — Clinical Social Work Note (Signed)
Clinical Social Work Assessment  Patient Details  Name: John Blackburn MRN: [id number] Date of Birth: April 12, 1946  Date of referral:  04/25/18               Reason for consult:  Facility Placement, Discharge Planning                Permission sought to share information with:  Family Supports Permission granted to share information::  Yes, Verbal Permission Granted  Name::     John Blackburn  Agency::     Relationship::  Son  Contact Information:  [phone]  Housing/Transportation Living arrangements for the past 2 months:  [place] of Information:  Patient Patient Interpreter Needed:  None Criminal Activity/Legal Involvement Pertinent to Current Situation/Hospitalization:  No - Comment as needed Significant Relationships:  Adult Children Lives with:  Self Do you feel safe going back to the place where you live?  No Need for family participation in patient care:  Yes (Comment)  Care giving concerns:  Patient agreeable to ST as he lives alone.  Social Worker assessment / plan: CSW talked with patient at the bedside regarding discharge disposition and recommendation of ST rehab. When asked, John Blackburn reported that he has been to rehab before but could not remember the name of the facility. Patient reported that he has 3 sons, John Blackburn and John Blackburn who live in [place] and Oaklyn who lives in [place]. CSW explained ST rehab recommendation and facility search process and patient expressed agreement. CSW talked with sons later this afternoon and they were also in agreement with SNF for ST rehab and assisted patient in choosing a skilled facility.    Employment status:  Retired Charity fundraiser) PT Recommendations:  [place] / Referral to community resources:  Skilled Nursing Facility(Patient provided with SNF list for [facility])  Patient/Family's Response to care: No concerns expressed  by patient or family regarding care during hospitalization.  Patient/Family's Understanding of and Emotional Response to Diagnosis, Current Treatment, and Prognosis:  Patient and sons expressed understanding of his medical status and the need for ST rehab.  Emotional Assessment Appearance:  Appears stated age Attitude/Demeanor/Rapport:  Engaged Affect (typically observed):  Appropriate, Pleasant Orientation:  Oriented to Self, Oriented to Place, Oriented to  Time, Oriented to Situation Alcohol / Substance use:  Tobacco Use, Alcohol Use, Illicit Drugs(Patient reported that he never smoked, drinks approx. 1 alcoholic drink per week and does not use illicit drugs) Psych involvement (Current and /or in the community):  No (Comment)  Discharge Needs  Concerns to be addressed:  Discharge Planning Concerns Readmission within the last 30 days:  No Current discharge risk:  None Barriers to Discharge:  No Barriers Identified   John Feil, LCSW 04/25/2018, 5:21 PM

## 2018-04-25 NOTE — Discharge Summary (Signed)
Physician Discharge Summary  John Blackburn:[id number] DOB: May 23, 1946 DOA: 04/23/2018  PCP: Hoyt Koch, MD  Admit date: 04/23/2018 Discharge date: 04/25/2018  Admitted From: Home Disposition:  Home  Discharge Condition:Stable CODE STATUS:FULL Diet recommendation: Heart Healthy  Brief/Interim Summary: Patient is a 72 year old male with past medical history of coronary disease, ischemic cardiomyopathy, hypertension, hyperlipidemia, GERD who presented with recurrent episodes of nausea, vomiting and loss of appetite from home.  Also noted to be mildly febrile at home.  Found to have acute kidney injury on presentation and also found to have hypokalemia.  His overall status significantly improved after hospitalization.  Because of his generalized weakness, PT/OT were consulted and he was recommended skilled nursing facility on discharge.  He is stable for discharge today to skilled nursing facility.  Following problems were addressed during his hospitalization:  Acute kidney injury: Most likely prerenal.  Patient had poor appetite at home.  Improved with hydration.  Lasix, Tenoretic on hold.  Check BMP in a week.  Severe dehydration: Much improved with IV fluids.  Hypokalemia:  Most likely associated  with diuretics.Continue supplementation at skilled nursing facility.  Anorexia: Nutrition consulted.  History of coronary disease: Denies any chest pain.  Continue his home medications.  On aspirin and Plavix.  Mild elevated troponin/EKG changes: EKG shows T wave inversion in the anterior and lateral leads.  Patient denies any chest pain.  Troponins are flat.    Does not need further intervention.  Hypertension: Currently his blood pressure soft.  Will hold all his home antihypertensives.  Dementia: Continue supportive care.  Continue home meds  GERD: Continue PPI  Hyperlipidemia: Continue Lipitor  Depression with anxiety: Continue Wellbutrin and  Lexapro.  Debility/deconditioning: PT eval to the patient and recommended SNF.  Social worker consulted.    Discharge Diagnoses:  Principal Problem:   ARF (acute renal failure) (HCC) Active Problems:   Hypertension   Hyperlipidemia   Obstructive sleep apnea   History of pulmonary embolism   GERD (gastroesophageal reflux disease)   Hypokalemia   Dehydration   Anorexia   Leucocytosis   EKG abnormalities    Discharge Instructions  Discharge Instructions    Diet - low sodium heart healthy   Complete by:  As directed    Discharge instructions   Complete by:  As directed    1) Do  CBC and BMP tests in a week.   Increase activity slowly   Complete by:  As directed      Allergies as of 04/25/2018      Reactions   Cephalexin Rash   Levofloxacin Rash      Medication List    STOP taking these medications   atenolol-chlorthalidone 50-25 MG tablet Commonly known as:  TENORETIC   furosemide 40 MG tablet Commonly known as:  LASIX     TAKE these medications   aspirin 81 MG EC tablet Take 1 tablet (81 mg total) by mouth daily.   atorvastatin 80 MG tablet Commonly known as:  LIPITOR TAKE 1 TABLET BY MOUTH ONCE DAILY AT  6PM What changed:  See the new instructions.   buPROPion 300 MG 24 hr tablet Commonly known as:  WELLBUTRIN XL TAKE 1 TABLET BY MOUTH ONCE DAILY   clopidogrel 75 MG tablet Commonly known as:  PLAVIX Take 1 tablet (75 mg total) by mouth daily.   escitalopram 10 MG tablet Commonly known as:  LEXAPRO Take 1 tablet (10 mg total) by mouth daily.   loratadine 10 MG tablet  Commonly known as:  CLARITIN Take 10 mg by mouth daily as needed for allergies.   Melatonin 1 MG/ML Liqd Take 2.5 mg by mouth at bedtime as needed (sleep).   memantine 7 MG Cp24 24 hr capsule Commonly known as:  NAMENDA XR Take 1 capsule (7 mg total) by mouth daily.   nitroGLYCERIN 0.4 MG SL tablet Commonly known as:  NITROSTAT Place 1 tablet (0.4 mg total) under the  tongue every 5 (five) minutes as needed.   pantoprazole 40 MG tablet Commonly known as:  PROTONIX TAKE 1 TABLET BY MOUTH ONCE DAILY **PLEASE  CALL  TO  MAKE  APPOINTMENT  FOR  FUTHER  REFILLS** What changed:  See the new instructions.   potassium chloride SA 20 MEQ tablet Commonly known as:  K-DUR,KLOR-CON Take 1 tablet (20 mEq total) by mouth daily. What changed:  how much to take      Follow-up Information    Hoyt Koch, MD. Schedule an appointment as soon as possible for a visit in 1 week(s).   Specialty:  Internal Medicine Contact information: [place] [address] [phone]          Allergies  Allergen Reactions  . Cephalexin Rash  . Levofloxacin Rash    Consultations: None  Procedures/Studies: Dg Chest 2 View  Result Date: 04/23/2018 CLINICAL DATA:  Cough EXAM: CHEST - 2 VIEW COMPARISON:  08/30/2017 FINDINGS: The heart size and mediastinal contours are within normal limits. Both lungs are clear. Surgical clips in the right upper quadrant. Degenerative changes of the spine. IMPRESSION: No active cardiopulmonary disease. Electronically Signed   By: Donavan Foil M.D.   On: 04/23/2018 19:26   Ct Head Wo Contrast  Result Date: 04/23/2018 CLINICAL DATA:  Altered level of consciousness (LOC), unexplained. Patient reports multiple falls. EXAM: CT HEAD WITHOUT CONTRAST TECHNIQUE: Contiguous axial images were obtained from the base of the skull through the vertex without intravenous contrast. COMPARISON:  Head CT 04/06/2018 FINDINGS: Brain: Unchanged atrophy and chronic small vessel ischemia. Remote bilateral basal ganglia air and left thalamic lacunar infarct. No intracranial hemorrhage, mass effect, or midline shift. No hydrocephalus. The basilar cisterns are patent. No evidence of territorial infarct or acute ischemia. No extra-axial or intracranial fluid collection. Vascular: Atherosclerosis of skullbase vasculature without hyperdense vessel  or abnormal calcification. Skull: No fracture or focal lesion. Sinuses/Orbits: Paranasal sinuses and mastoid air cells are clear. The visualized orbits are unremarkable. Other: None. IMPRESSION: 1.  No acute intracranial abnormality. 2. Unchanged advanced chronic small vessel ischemia, remote lacunar infarcts and generalized atrophy. Electronically Signed   By: Jeb Levering M.D.   On: 04/23/2018 21:43   Ct Head Wo Contrast  Result Date: 04/06/2018 CLINICAL DATA:  Progressive memory loss. EXAM: CT HEAD WITHOUT CONTRAST TECHNIQUE: Contiguous axial images were obtained from the base of the skull through the vertex without intravenous contrast. COMPARISON:  12/08/2017. FINDINGS: Brain: No evidence for acute infarction, hemorrhage, mass lesion, hydrocephalus, or extra-axial fluid. Generalized atrophy. BILATERAL basal ganglia infarcts, greater on the RIGHT. LEFT thalamic infarct. Extensive hypoattenuation of white matter, likely small vessel disease. Vascular: Calcification of the cavernous internal carotid arteries and distal vertebral arteries consistent with cerebrovascular atherosclerotic disease. No signs of intracranial large vessel occlusion. Skull: Normal. Negative for fracture or focal lesion. Sinuses/Orbits: No acute finding. Other: None. 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 Evidence of chronic ischemia. No acute intracranial findings. Electronically Signed   By: Roderic Ovens.D.  On: 04/06/2018 13:30       Subjective: Patient seen and examined at bedside this morning.  Remains comfortable.  Hemodynamically stable.  No active issues/ events.  Discharge Exam: Vitals:   04/25/18 0538 04/25/18 0808  BP: (!) 91/44 106/70  Pulse: (!) 54 (!) 52  Resp: 16 14  Temp: 98.6 F (37 C) 97.9 F (36.6 C)  SpO2: 100% 98%   Vitals:   04/24/18 1645 04/24/18 2058 04/25/18 0538 04/25/18 0808  BP: 112/73 101/65 (!) 91/44 106/70  Pulse: 77 (!) 56 (!) 54 (!) 52  Resp: 20 18  16 14   Temp: 97.9 F (36.6 C) 98.5 F (36.9 C) 98.6 F (37 C) 97.9 F (36.6 C)  TempSrc: Oral Oral Oral Oral  SpO2: 100% 100% 100% 98%  Weight:      Height:        General: Pt is alert, awake, not in acute distress Cardiovascular: RRR, S1/S2 +, no rubs, no gallops Respiratory: CTA bilaterally, no wheezing, no rhonchi Abdominal: Soft, NT, ND, bowel sounds + Extremities: no edema, no cyanosis    The results of significant diagnostics from this hospitalization (including imaging, microbiology, ancillary and laboratory) are listed below for reference.     Microbiology: No results found for this or any previous visit (from the past 240 hour(s)).   Labs: BNP (last 3 results) Recent Labs    08/30/17 1004  BNP 825.0*   Basic Metabolic Panel: Recent Labs  Lab 04/23/18 1610 04/24/18 0131 04/24/18 0714 04/24/18 1229 04/25/18 0412  NA 138  --  136  --  138  K 2.6*  --  3.0*  --  3.2*  CL 93*  --  100  --  105  CO2 31  --  27  --  27  GLUCOSE 226*  --  182*  --  114*  BUN 42*  --  42*  --  26*  CREATININE 2.11* 2.11* 2.01*  --  1.33*  CALCIUM 9.6  --  8.5*  --  8.1*  MG  --   --   --  1.8  --    Liver Function Tests: Recent Labs  Lab 04/23/18 1610 04/24/18 0714  AST 52* 33  ALT 42 34  ALKPHOS 157* 128*  BILITOT 1.5* 1.0  PROT 7.4 6.2*  ALBUMIN 3.9 3.2*   Recent Labs  Lab 04/23/18 1610  LIPASE 74*   No results for input(s): AMMONIA in the last 168 hours. CBC: Recent Labs  Lab 04/23/18 1610 04/24/18 0131 04/24/18 0714 04/25/18 0412  WBC 16.1* 14.8* 12.7* 8.6  NEUTROABS  --   --   --  6.1  HGB 15.3 14.2 12.9* 11.1*  HCT 44.9 41.0 37.2* 32.8*  MCV 96.8 96.0 96.4 97.3  PLT 253 208 182 155   Cardiac Enzymes: Recent Labs  Lab 04/24/18 0131 04/24/18 0714 04/24/18 1229 04/25/18 0412  TROPONINI 0.03* 0.03* <0.03 <0.03   BNP: Invalid input(s): POCBNP CBG: No results for input(s): GLUCAP in the last 168 hours. D-Dimer No results for input(s):  DDIMER in the last 72 hours. Hgb A1c No results for input(s): HGBA1C in the last 72 hours. Lipid Profile No results for input(s): CHOL, HDL, LDLCALC, TRIG, CHOLHDL, LDLDIRECT in the last 72 hours. Thyroid function studies Recent Labs    04/24/18 0131  TSH 0.470   Anemia work up No results for input(s): VITAMINB12, FOLATE, FERRITIN, TIBC, IRON, RETICCTPCT in the last 72 hours. Urinalysis    Component Value Date/Time  COLORURINE YELLOW 04/24/2018 0410   APPEARANCEUR HAZY (A) 04/24/2018 0410   LABSPEC 1.017 04/24/2018 0410   PHURINE 5.0 04/24/2018 0410   GLUCOSEU NEGATIVE 04/24/2018 0410   HGBUR NEGATIVE 04/24/2018 0410   BILIRUBINUR NEGATIVE 04/24/2018 0410   BILIRUBINUR negative 09/13/2016 1816   BILIRUBINUR neg 12/06/2011 1743   KETONESUR NEGATIVE 04/24/2018 0410   PROTEINUR NEGATIVE 04/24/2018 0410   UROBILINOGEN 0.2 09/13/2016 1816   NITRITE NEGATIVE 04/24/2018 0410   LEUKOCYTESUR NEGATIVE 04/24/2018 0410   Sepsis Labs Invalid input(s): PROCALCITONIN,  WBC,  LACTICIDVEN Microbiology No results found for this or any previous visit (from the past 240 hour(s)).  Please note: You were cared for by a hospitalist during your hospital stay. Once you are discharged, your primary care physician will handle any further medical issues. Please note that NO REFILLS for any discharge medications will be authorized once you are discharged, as it is imperative that you return to your primary care physician (or establish a relationship with a primary care physician if you do not have one) for your post hospital discharge needs so that they can reassess your need for medications and monitor your lab values.    Time coordinating discharge: 40 minutes  SIGNED:   Shelly Coss, MD  [facility] 04/25/2018, 11:46 AM Pager [phone]  If 7PM-7AM, please contact night-coverage [email or web address] Password [email or web address]

## 2018-04-26 ENCOUNTER — Ambulatory Visit: Payer: PPO

## 2018-04-26 ENCOUNTER — Telehealth: Payer: Self-pay | Admitting: *Deleted

## 2018-04-26 DIAGNOSIS — I2510 Atherosclerotic heart disease of native coronary artery without angina pectoris: Secondary | ICD-10-CM | POA: Diagnosis not present

## 2018-04-26 DIAGNOSIS — N178 Other acute kidney failure: Secondary | ICD-10-CM | POA: Diagnosis not present

## 2018-04-26 DIAGNOSIS — I10 Essential (primary) hypertension: Secondary | ICD-10-CM | POA: Diagnosis not present

## 2018-04-26 DIAGNOSIS — F0390 Unspecified dementia without behavioral disturbance: Secondary | ICD-10-CM | POA: Diagnosis not present

## 2018-04-26 NOTE — Telephone Encounter (Signed)
Pt was on TCM report admitted 04/23/18 for recurrent episodes of nausea, vomiting and loss of appetite from home. Pt was noted to be mildly febrile at home, and was found to have acute kidney injury and also have hypokalemia. Because of his generalized weakness, PT/OT were consulted and he was recommended skilled nursing facility and was D/C  Discharge 04/25/18. Per summary will need to follow-up w/PCP after discharge from SNF.Marland KitchenJohny Chess

## 2018-04-27 DIAGNOSIS — E46 Unspecified protein-calorie malnutrition: Secondary | ICD-10-CM | POA: Diagnosis not present

## 2018-04-27 DIAGNOSIS — E860 Dehydration: Secondary | ICD-10-CM | POA: Diagnosis not present

## 2018-04-27 DIAGNOSIS — N179 Acute kidney failure, unspecified: Secondary | ICD-10-CM | POA: Diagnosis not present

## 2018-04-27 DIAGNOSIS — F419 Anxiety disorder, unspecified: Secondary | ICD-10-CM | POA: Diagnosis not present

## 2018-04-27 DIAGNOSIS — I509 Heart failure, unspecified: Secondary | ICD-10-CM | POA: Diagnosis not present

## 2018-04-27 DIAGNOSIS — F322 Major depressive disorder, single episode, severe without psychotic features: Secondary | ICD-10-CM | POA: Diagnosis not present

## 2018-04-27 DIAGNOSIS — F0390 Unspecified dementia without behavioral disturbance: Secondary | ICD-10-CM | POA: Diagnosis not present

## 2018-04-27 DIAGNOSIS — R531 Weakness: Secondary | ICD-10-CM | POA: Diagnosis not present

## 2018-04-27 DIAGNOSIS — K219 Gastro-esophageal reflux disease without esophagitis: Secondary | ICD-10-CM | POA: Diagnosis not present

## 2018-04-27 DIAGNOSIS — I2510 Atherosclerotic heart disease of native coronary artery without angina pectoris: Secondary | ICD-10-CM | POA: Diagnosis not present

## 2018-04-27 DIAGNOSIS — L859 Epidermal thickening, unspecified: Secondary | ICD-10-CM | POA: Diagnosis not present

## 2018-05-02 ENCOUNTER — Other Ambulatory Visit: Payer: Self-pay | Admitting: *Deleted

## 2018-05-02 NOTE — Patient Outreach (Signed)
[place] [facility]) Care Management  05/02/2018  XYLAN SHEILS 22-Sep-1945 [id number]   Met with Vickii Chafe, SW at [place] at onsite visit.  Peggy reports patient lives alone but has supportive son, who is considering getting some private pay help in home for some IADL assistance such as meals. She anticipates patient will be discharging next week.  Met with patient at facility.  Patient reports he lives alone. He states he hopes to go home soon, hoping by next week at the latest. He reports he is mostly independent, he is still driving. He states he eats out mostly for meals but can "do a little cooking".  Patient reports his son assists with medication management by filling up a med box for him.  He states his oldest son is Shaun Runyon [phone].   RNCM discussed care management program with him, gave information packet.  Patient states best number for contacting him is [phone].  Patient verbally agrees to [facility] care management to call him upon discharge.   Will visit again next week and place referral for transition of care services upon discharge.  Will collaborate with [facility] Royetta Crochet. Laymond Purser, RN, BSN, [place] [phone]) Business Cell  [phone]) Toll Free Office

## 2018-05-03 DIAGNOSIS — I2510 Atherosclerotic heart disease of native coronary artery without angina pectoris: Secondary | ICD-10-CM | POA: Diagnosis not present

## 2018-05-03 DIAGNOSIS — Z86711 Personal history of pulmonary embolism: Secondary | ICD-10-CM | POA: Diagnosis not present

## 2018-05-03 DIAGNOSIS — I10 Essential (primary) hypertension: Secondary | ICD-10-CM | POA: Diagnosis not present

## 2018-05-03 DIAGNOSIS — F0390 Unspecified dementia without behavioral disturbance: Secondary | ICD-10-CM | POA: Diagnosis not present

## 2018-05-04 ENCOUNTER — Telehealth: Payer: Self-pay

## 2018-05-04 NOTE — Telephone Encounter (Signed)
Copied from [place] [id number]. Topic: General - Other >> May 04, 2018  4:09 PM Yvette Rack wrote: Reason for CRM: Nurse [place] from [facility] [phone] calling stating that the pt son state that Dr Sharlet Salina wanted pt to have a Vit D injection Nurse [place] states that she need to fax aver a RX for that order Fax # [phone]

## 2018-05-08 NOTE — Telephone Encounter (Signed)
[facility] back stating they need a written order that I can fax to them so patient can get the B12 injections

## 2018-05-08 NOTE — Telephone Encounter (Signed)
Needs B12 injections.

## 2018-05-08 NOTE — Telephone Encounter (Signed)
Left a message  With [facility] to give to [place] with MD response

## 2018-05-08 NOTE — Telephone Encounter (Signed)
faxed

## 2018-05-08 NOTE — Telephone Encounter (Signed)
Given rx

## 2018-05-10 ENCOUNTER — Other Ambulatory Visit: Payer: Self-pay | Admitting: *Deleted

## 2018-05-10 ENCOUNTER — Telehealth: Payer: Self-pay | Admitting: Internal Medicine

## 2018-05-10 DIAGNOSIS — Z86711 Personal history of pulmonary embolism: Secondary | ICD-10-CM | POA: Diagnosis not present

## 2018-05-10 DIAGNOSIS — I10 Essential (primary) hypertension: Secondary | ICD-10-CM | POA: Diagnosis not present

## 2018-05-10 DIAGNOSIS — F0390 Unspecified dementia without behavioral disturbance: Secondary | ICD-10-CM | POA: Diagnosis not present

## 2018-05-10 DIAGNOSIS — I2510 Atherosclerotic heart disease of native coronary artery without angina pectoris: Secondary | ICD-10-CM | POA: Diagnosis not present

## 2018-05-10 NOTE — Patient Outreach (Addendum)
[place] [facility] Care Management  05/10/2018  John Blackburn 06/27/1946 [id number]   Onsite visit to facility. Per Vickii Chafe, SW at facility patient already discharged, unable to meet with him.   Plan to place referral for telephonic transition of care follow up. Patient will also have home healthcare and private pay aide 3 hours a day 7 days a week.   Royetta Crochet. Laymond Purser, RN, BSN, [place] [phone]) Business Cell  [phone]) Toll Free Office

## 2018-05-10 NOTE — Telephone Encounter (Signed)
Copied from [place] [id number]. Topic: General - Other >> May 10, 2018  2:09 PM Valla Leaver wrote: Reason for CRM: Darlina Guys, LPN with [facility] notifying home health starting 09/09.

## 2018-05-10 NOTE — Telephone Encounter (Signed)
fyi

## 2018-05-11 ENCOUNTER — Other Ambulatory Visit: Payer: Self-pay | Admitting: *Deleted

## 2018-05-11 NOTE — Patient Outreach (Signed)
[facility]) Care Management  05/11/2018  MATTI MINNEY 1946/04/03 [id number]   Transition of Care Referral  Referral Date: 05/11/18 Referral Source: [facility] RN CM SNF liaison, [name]  Patient gave [phone] as best number  Son Jarman Litton is alternative contact [phone]  Date of Admission: 04/25/18 Diagnosis: AKI, hypokalemia Date of Discharge:05/10/18 Facility: [place]: HTA  Son Raydin Bielinski from the alternative contact [phone] returned a call to [facility] RN CM Patient is not available and hs dementia but [name] is able to verify HIPAA Reviewed and addressed Transitional of care referral with patient CM discussed that she also left a message for the patient but his son does not think the patient will return the call. This patient was previously followed by [place] [facility] health coach until he was not able to be reached in March 2019   Social: Mr Dollar is being [facility] starting 05/14/18 and the family is paying out of pocket for 3 hours daily home services from home instead Prior to admissions he lived alone and was mainly independent and driving  His son states he now assists with transportation and medication management (filling med box)  His son works and when asked about other caregivers for patient, [name] states again that Home instead is available 3 hours a day  He has a quad cane and a walker  Conditions AKI, hypokalemia, HF, coronary disease, ischemic cardiomyopathy, hypertension, hyperlipidemia, GERD, dementia, depression with anxiety, anorexia  Medications: they are presently using the medications sent home from the snf but will get prescriptions filled soon. [name] denies concerns with taking medications as prescribed, affording medications, side effects of medications and questions about medications   Appointments: There is an appointment to follow up with the neurologist that [name] states he made for Mr Brigg [place] will be making a hospital/facility appointment for follow up with the primary care provider, Dr Sharlet Salina (last seen 04/12/18) Cm discussed the importance of follow up care   Advance directives: He has a POA and is not interested in making changes at this time  Consent: [place] reviewed [facility] services with patient's son. Patient's son gave verbal consent for services and is interested in [place] services after inquiring about the charge. Advised patient's son that other post discharge calls may occur to assess how the patient is doing following the recent hospitalization. Patient's son voiced understanding and was appreciative of f/u call.    Plan: [facility] RN CM will refer Mr Krauser to [facility] RN CM for transition of care, disease management and pt/family education. He is being [facility] by [facility] starting 05/14/18 and the family is paying out of pocket for 3 hours daily home services from home instead Prior to admissions he lived alone and was mainly independent and driving    [place]. Lavina Hamman, RN, BSN, [place] Management Care Coordinator Direct Number [phone] Mobile number [phone]  Main [facility] number [phone] Fax number [phone]

## 2018-05-11 NOTE — Patient Outreach (Signed)
[facility]) Care Management  05/11/2018  John Blackburn 01-18-1946 [id number]   Transition of Care Referral   Referral Date: 05/11/18 Referral Source: [facility] RN CM SNF liaison, [name]  Patient gave [phone] as best number  Son John [facility] is alternative contact [phone]  Date of Admission: 04/25/18 Diagnosis: AKI, hypokalemia Date of Discharge:05/10/18 Facility: [place]: HTA  Outreach attempt # 1 No answer at [phone] [facility] RN CM left HIPAA compliant voicemail message along with CM's contact info.  Son John [facility] is alternative contact [phone] dialed related to noted dementia hx and Reached his son who requests to return a call to [facility] RN CM   Conditions  AKI, hypokalemia, coronary disease, ischemic cardiomyopathy, hypertension, hyperlipidemia, GERD, dementia,depression with anxiety,  anorexia   Plan: [facility] RN CM sent an unsuccessful outreach letter and scheduled this patient for another call attempt within 4 business days  John Kaner L. Lavina Hamman, RN, BSN, [place] Management Care Coordinator Direct Number [phone] Mobile number [phone]  Main [facility] number [phone] Fax number [phone]

## 2018-05-14 ENCOUNTER — Ambulatory Visit: Payer: PPO

## 2018-05-15 ENCOUNTER — Other Ambulatory Visit: Payer: Self-pay | Admitting: *Deleted

## 2018-05-15 ENCOUNTER — Other Ambulatory Visit: Payer: Self-pay

## 2018-05-15 NOTE — Patient Outreach (Signed)
Transition of care:  New referral. Week 1 Transition of care already completed by telephonic nurse.  Placed call to son John Blackburn and explained reason for call. Mr John [facility] ( son) reports that patient has caregivers for 3 hours per day 7 days a week. Reports that patients children check on patient throughout day with meals.   Mr. John Blackburn reports that patient sleeps a lot. States that he takes care of his father medications. Reports that key are taken away from patient right now due to memory concerns. Son denies any falls. Reports [facility] states multiple times they were coming to see patient and then did not show up. Son reports that when they did not come yesterday that he call [facility] and told them to forget it. Son reports that he called John Blackburn at [employer] and no one called him back.  [name]  PLAN: I placed call to John Blackburn ( post acute coordinator) and reviewed home health issue. She call facility and John [facility] encouraged son to call her back. Placed call back to son to give him the message.  Home visit is set for 05/21/2018 at 10:30. Address confirmed. Encouraged son to call me for any additional concerns prior to home visit.  John Rand, RN, BSN, CEN [facility] [employer] [phone]

## 2018-05-15 NOTE — Patient Outreach (Addendum)
[facility]) Care Management  05/15/2018  FORRESTER BLANDO [birth year] [id number]   Transition of Care Referral  Referral Date:05/11/18 Referral Source:[facility] RN CM SNF liaison, [name] Patient gave [phone] as best number  Son Tiegan Terpstra is alternative contact [phone] Date of Admission:04/25/18 Diagnosis:AKI, hypokalemia Date of Discharge:05/10/18 Facility:[place] Insurance:HTA  Mr Giammarino returned a call to [facility] RN CM  A voice message with noted on 05/14/18 that was left by him on 05/11/18 at 2157 CM returned a call to Mr [place]burn CM explained to him that his son [name] did return a call to [place] on 05/11/18  Mr Torbeck confirms today he has already received services from [facility] home care after he retrieved the Home health agency's package left for him He also confirms receiving 3 hours of services from home instead  He reports he is doing well using his walker   Medications- When CM assessed his medicines needs he states his son has taken away meds and "two keys"  from him recently and reports this "upsets me" He admits to memory issues.When he discussed that he was "upset" about it CM offered [facility] services but he refused services and states he might need them after "I talk with him"   CM encouraged Mr Kothari to have a conversation with his son about his concerns about his keys and meds. CM discussed possible safety concerns related to meds and driving Encouraged Mr Lalone to contact [facility] CM or his MD prn Mr Forcier states he lives alone but feels safe  He reports he has not taken meds today He states [place] visited "this morning" He confirms [name] is the primary care giver CM discuss the [place] RN CM referral   Plan [facility] RN CM updated [facility] RN CM via in basket of the patient's voiced concern    Kimberly L. Lavina Hamman, RN, BSN, [place] Coordinator Office number [phone] Mobile number [phone]  Main [facility] number [phone] Fax number [phone]

## 2018-05-17 ENCOUNTER — Telehealth: Payer: Self-pay | Admitting: Internal Medicine

## 2018-05-17 NOTE — Telephone Encounter (Signed)
fyi

## 2018-05-17 NOTE — Telephone Encounter (Signed)
Copied from [place] [id number]. Topic: Quick Communication - See Telephone Encounter >> May 17, 2018 10:51 AM Bea Graff, NT wrote: CRM for notification. See Telephone encounter for: 05/17/18. Lanny Hurst with [facility] states there will be a delay in the start of care for this pt. The son request they come out next week so that he may be present for the appt. CB#: [phone].

## 2018-05-21 ENCOUNTER — Other Ambulatory Visit: Payer: Self-pay

## 2018-05-21 NOTE — Patient Outreach (Signed)
[facility]) Care Management   05/21/2018  John Blackburn 05-Jun-1946 [id number]  John Blackburn is an 72 y.o. male Joint home visit with son, John Blackburn and caregiver John [facility]. Arrived for home visit and patient was in the shower. Subjective:  Son reports that prior to admission patient was not eating or drinking well. Reports frequent falls. Patient got lost while driving recently.  Son reports that patient was not taking his medications and not eating correctly.  Now, patient has a caregiver for 3 hours per day, 7 days a week. Son is currently manages medications with an electronic pill organizer with alarms.   Patient states that he is doing better now at home with a caregiver to prepare meals and assist as needed. Caregiver prompts patient to take his medications daily.   Patient reports he wants to drive but understands about safety concerns.   Home health PT to start on 05/22/2018 Objective:Awake and alert but forgetful. When patient is asked to put on his socks and shoes he only puts on one sock.  Unable to verbalize day of the week.  Ambulates slowly.  Today's Vitals   05/21/18 1109  BP: 110/64  Pulse: 77  Resp: 18  SpO2: 95%  Weight: 210 lb (95.3 kg)  Height: 1.829 m (6')    Review of Systems  Constitutional: Negative.   Eyes:       Wears reading glasses  Respiratory: Negative.   Cardiovascular: Negative.   Gastrointestinal: Negative.   Genitourinary: Negative.   Musculoskeletal: Positive for falls.  Skin: Negative.   Neurological: Positive for headaches.  Endo/Heme/Allergies: Negative.   Psychiatric/Behavioral: Positive for depression and memory loss.    Physical Exam  Constitutional: He appears well-developed and well-nourished.  Cardiovascular: Normal rate and regular rhythm.  Respiratory: Effort normal and breath sounds normal.  GI: Soft. Bowel sounds are normal.  Musculoskeletal: Normal range of motion. He exhibits edema.  Trace edema   Neurological: He is alert.  2019, Trump, September, unable to verbalize day of the week.  Unable to remember going to church yesterday.   Skin: Skin is warm and dry.  Psychiatric: He has a normal mood and affect. His behavior is normal. Judgment and thought content normal.    Encounter Medications:   Outpatient Encounter Medications as of 05/21/2018  Medication Sig  . aspirin EC 81 MG EC tablet Take 1 tablet (81 mg total) by mouth daily.  [name] atorvastatin (LIPITOR) 80 MG tablet TAKE 1 TABLET BY MOUTH ONCE DAILY AT  6PM (Patient taking differently: Take 80 mg by mouth daily at 6 PM. )  . buPROPion (WELLBUTRIN XL) 300 MG 24 hr tablet TAKE 1 TABLET BY MOUTH ONCE DAILY  . clopidogrel (PLAVIX) 75 MG tablet Take 1 tablet (75 mg total) by mouth daily.  [name] escitalopram (LEXAPRO) 10 MG tablet Take 1 tablet (10 mg total) by mouth daily.  . memantine (NAMENDA XR) 7 MG CP24 24 hr capsule Take 1 capsule (7 mg total) by mouth daily.  . nitroGLYCERIN (NITROSTAT) 0.4 MG SL tablet Place 1 tablet (0.4 mg total) under the tongue every 5 (five) minutes as needed.  . pantoprazole (PROTONIX) 40 MG tablet TAKE 1 TABLET BY MOUTH ONCE DAILY **PLEASE  CALL  TO  MAKE  APPOINTMENT  FOR  FUTHER  REFILLS** (Patient taking differently: Take 40 mg by mouth daily. )  . potassium chloride SA (K-DUR,KLOR-CON) 20 MEQ tablet Take 1 tablet (20 mEq total) by mouth daily.  [name] loratadine (CLARITIN)  10 MG tablet Take 10 mg by mouth daily as needed for allergies.   . Melatonin 1 MG/ML LIQD Take 2.5 mg by mouth at bedtime as needed (sleep).    No facility-administered encounter medications on file as of 05/21/2018.     Functional Status:   In your present state of health, do you have any difficulty performing the following activities: 05/21/2018 04/24/2018  Hearing? Y John [facility]burn? Y N  Comment wears reading glasses -  Difficulty concentrating or making decisions? Y N  Comment - -  Walking or climbing stairs? Y Y  Dressing  or bathing? Y N  Doing errands, shopping? John Blackburn  Preparing Food and eating ? Y -  Using the Toilet? N -  In the past six months, have you accidently leaked urine? N -  Do you have problems with loss of bowel control? Y -  Managing your Medications? Y -  Comment uses a med alert due to forgetting to take medications.  -  Managing your Finances? Y -  Housekeeping or managing your Housekeeping? Y -  Some recent data might be hidden    Fall/Depression Screening:    Fall Risk  05/21/2018 10/23/2017 10/17/2017  Falls in the past year? Yes Yes Yes  Comment - - -  Number falls in past yr: 2 or more 1 1  Comment - - -  Injury with Fall? Yes Yes Yes  Comment - - fractured right arm  Risk Factor Category  - - -  Risk for fall due to : History of fall(s) Impaired balance/gait;History of fall(s) Impaired balance/gait  Follow up Falls evaluation completed;Falls prevention discussed Education provided;Falls prevention discussed Falls prevention discussed   PHQ 2/9 Scores 05/21/2018 05/11/2018 03/09/2018 02/09/2018 10/23/2017 10/17/2017 10/03/2017  PHQ - 2 Score 0 0 4 0 4 4 0  PHQ- 9 Score - - 11 - 9 9 -    Assessment:   (1) reviewed [facility] care management program. Provided a new patient packet. Provided my contact card and [facility] calendar. Consent signed. Reviewed with patient that I would be calling once a week to check on him and he understood. (2)  Has a health care power of attorney but no living will. (3) frequent falls (4) difficulty taking medications (5) Home health PT to start on 05/22/2018 (6) caregiver 3 hours per day.  (7) not wearing CPAP on a regular basis due to being unable to remember to put it on. (8) heart failure: not weighing (9) son interested in talking with social worker about future placement and planning.  (10)memory loss Plan:  (1) consent scanned into chart. Next outreach in 1 week via phone (2)  Provided Advanced directive packet and reviewed with son.  (3) reviewed fall  precautions with patient, son and caregiver. Reviewed using walker. Encouraged patient to increase his exercise to improve his strength. (4) Reviewed and assisted son with medication and filling medication box.  Encouraged care giver to continue to prompt patient to take his medications. (5) Reviewed plan with patient and son. (6) reviewed plan with son and patient. Patient appears to be safe with caregiver support in the home. Reviewed safety like smoke alarms in the home and calling 911 with patient.  (7) discussed with patient the benefits of using a Cpap machine with memory and overall health. Encouraged patient to wear every night. (8) Encouraged son to get a scale. Caregiver states that she would weigh patient daily. (9)[facility] referral placed for social  Insurance underwriter. (10) Reviewed with patient things to keep his mind sharp like reading, puzzles. Encouraged patient to take all medications as prescribed.  Assisted son with a list of concerns to discuss during [facility] follow up this week with primary MD.  This note and barrier letter sent to MD.  [facility] CM Care Plan Problem One     Most Recent Value  Care Plan Problem One  Patient with recent admission to [facility] for weakness and falls.   Role Documenting the Problem One  Care Management [place] for Problem One  Active  [facility]N Long Term Goal   Patient will report no readmissions in the enxt 60 days.   [facility]N Long Term Goal Start Date  05/21/18  Interventions for Problem One Long Term Goal  Home visit completed. Reviewed transition of care program with patient and son. Will contact weekly for follow up. Provided my contact information and encouraged patient, caregiver or son to call as needed.  THN CM Short Term Goal #1   Patient will report no falls in the next 30 days.   THN CM Short Term Goal #1 Start Date  05/22/18  Interventions for Short Term Goal #1  Reviewed fall precautions and encouraged patient to be active.    THN CM Short Term  Goal #2   Patient will weigh daily and record weights in [facility] calendar for the next 30 days.   THN CM Short Term Goal #2 Start Date  05/22/18  Interventions for Short Term Goal #2  Encouraged son to get a scale and reviewed reasons for weighing. will reassess next week.   THN CM Short Term Goal #3  Patient will take his medications as prescribed in the next 30 days.   THN CM Short Term Goal #3 Start Date  05/22/18  Interventions for Short Tern Goal #3  assisted son with medications and reviewed how to take all medications. Confirmed that med box was correctly filled.      Tomasa Rand, RN, BSN, CEN [facility] [employer] [phone]

## 2018-05-22 ENCOUNTER — Telehealth: Payer: Self-pay

## 2018-05-22 NOTE — Telephone Encounter (Signed)
Copied from [place] [id number]. Topic: Inquiry >> May 22, 2018 11:59 AM Oliver Pila B wrote: Reason for CRM: [facility] called to state they will go out to see the pt on the 19th; contact if needed [phone]

## 2018-05-22 NOTE — Addendum Note (Signed)
Addended by: Thana Ates on: 05/22/2018 05:43 PM   Modules accepted: Orders

## 2018-05-22 NOTE — Telephone Encounter (Signed)
fyi

## 2018-05-23 ENCOUNTER — Encounter: Payer: Self-pay | Admitting: Internal Medicine

## 2018-05-23 ENCOUNTER — Other Ambulatory Visit: Payer: Self-pay

## 2018-05-23 NOTE — Patient Outreach (Signed)
Care coordination:  Placed call to [facility] with [facility] and requested that daily weight be added to care plan. Reviewed with Justice Rocher that patient has heart failure.  She voiced understanding and will add to careplan.  Tomasa Rand, RN, BSN, CEN [facility] [employer] [phone]

## 2018-05-24 ENCOUNTER — Other Ambulatory Visit (INDEPENDENT_AMBULATORY_CARE_PROVIDER_SITE_OTHER): Payer: PPO

## 2018-05-24 ENCOUNTER — Encounter: Payer: Self-pay | Admitting: Internal Medicine

## 2018-05-24 ENCOUNTER — Ambulatory Visit (INDEPENDENT_AMBULATORY_CARE_PROVIDER_SITE_OTHER): Payer: PPO | Admitting: Internal Medicine

## 2018-05-24 VITALS — BP 120/74 | HR 82 | Temp 97.6°F | Ht 72.0 in | Wt 222.0 lb

## 2018-05-24 DIAGNOSIS — I5032 Chronic diastolic (congestive) heart failure: Secondary | ICD-10-CM | POA: Diagnosis not present

## 2018-05-24 DIAGNOSIS — E538 Deficiency of other specified B group vitamins: Secondary | ICD-10-CM | POA: Diagnosis not present

## 2018-05-24 DIAGNOSIS — N179 Acute kidney failure, unspecified: Secondary | ICD-10-CM | POA: Diagnosis not present

## 2018-05-24 DIAGNOSIS — R159 Full incontinence of feces: Secondary | ICD-10-CM | POA: Diagnosis not present

## 2018-05-24 DIAGNOSIS — F321 Major depressive disorder, single episode, moderate: Secondary | ICD-10-CM | POA: Diagnosis not present

## 2018-05-24 DIAGNOSIS — Z23 Encounter for immunization: Secondary | ICD-10-CM | POA: Diagnosis not present

## 2018-05-24 DIAGNOSIS — I10 Essential (primary) hypertension: Secondary | ICD-10-CM | POA: Diagnosis not present

## 2018-05-24 DIAGNOSIS — R413 Other amnesia: Secondary | ICD-10-CM | POA: Diagnosis not present

## 2018-05-24 LAB — BASIC METABOLIC PANEL
BUN: 14 mg/dL (ref 6–23)
CHLORIDE: 105 meq/L (ref 96–112)
CO2: 30 meq/L (ref 19–32)
CREATININE: 1.07 mg/dL (ref 0.40–1.50)
Calcium: 9.2 mg/dL (ref 8.4–10.5)
GFR: 72.17 mL/min (ref >60.00)
Glucose, Bld: 102 mg/dL — ABNORMAL HIGH (ref 70–99)
POTASSIUM: 3.9 meq/L (ref 3.5–5.1)
Sodium: 142 mEq/L (ref 135–145)

## 2018-05-24 LAB — CBC
HEMATOCRIT: 37.6 % — AB (ref 39.0–52.0)
HEMOGLOBIN: 12.6 g/dL — AB (ref 13.0–17.0)
MCHC: 33.5 g/dL (ref 30.0–36.0)
MCV: 99.1 fl (ref 78.0–100.0)
Platelets: 230 10*3/uL (ref 150.0–400.0)
RBC: 3.79 Mil/uL — ABNORMAL LOW (ref 4.22–5.81)
RDW: 15.1 % (ref 11.5–15.5)
WBC: 7.3 10*3/uL (ref 4.0–10.5)

## 2018-05-24 MED ORDER — CYANOCOBALAMIN 1000 MCG/ML IJ SOLN
1000.0000 ug | Freq: Once | INTRAMUSCULAR | Status: AC
  Administered 2018-05-24: 1000 ug via INTRAMUSCULAR

## 2018-05-24 NOTE — Assessment & Plan Note (Signed)
Stable off meds currently. Has been on lasix before. No weight gain or SOB currently.

## 2018-05-24 NOTE — Assessment & Plan Note (Signed)
Currently stable on lexapro and wellbutrin although it is unclear if he is having benefit from medication change. He does not admit to being depressed currently. It is unclear if this is related to memory change although per family he has had poorly controlled depression for many decades.

## 2018-05-24 NOTE — Addendum Note (Signed)
Addended by: Raford Pitcher R on: 05/24/2018 11:30 AM   Modules accepted: Orders

## 2018-05-24 NOTE — Assessment & Plan Note (Signed)
B12 shot given at visit. It is unclear if this is related to memory changes.

## 2018-05-24 NOTE — Patient Instructions (Signed)
We are checking the labs and have given you the flu shot and B12 shot today.   Do not take potassium anymore.

## 2018-05-24 NOTE — Assessment & Plan Note (Signed)
Advised not to drive. FL-2 filled out for assisted living. Has at home help for 3 hours a day currently.

## 2018-05-24 NOTE — Assessment & Plan Note (Signed)
Some leaking as he does not know when he has to go. Asked them to schedule toileting to help with going to the bathroom without accidents.

## 2018-05-24 NOTE — Assessment & Plan Note (Signed)
Checking BMP to make sure resolution is still there. He is eating and drinking well per patient and his son. BP at goal off meds and will keep off meds currently.

## 2018-05-24 NOTE — Progress Notes (Signed)
   Subjective:    Patient ID: John Blackburn, male    DOB: Jan 06, 1946, 72 y.o.   MRN: [id number]  HPI The patient is a 72 YO man coming in for hospital follow up (in for nausea and vomiting with AKI and low potassium levels as well as low BP, given IV fluids and stopped several medications with resolution of symptoms). He did go to SNF for rehab and left there about 2 weeks ago. He is doing okay at home. In home care for 3 hours per day. Now having a pill alarm to help him remember to take pills appropriately at home. Denies falls at home. Using walker to get around. Able to walk 100 feet without stopping. PT/OT will start next week. Still with lots of short term memory problems. We tried aricept and stopped it thinking it was causing GI problems, now on namenda without problems. Not driving currently. Denies chest pains or SOB. Denies nausea or vomiting. Eating well since being home. Denies constipation or diarrhea. Denies blood in stool.   PMH, [facility], social history reviewed and updated.   Review of Systems  Unable to perform ROS: Dementia  Constitutional: Negative.   HENT: Negative.   Eyes: Negative.   Respiratory: Negative for cough, chest tightness and shortness of breath.   Cardiovascular: Negative for chest pain, palpitations and leg swelling.  Gastrointestinal: Negative for abdominal distention, abdominal pain, constipation, diarrhea, nausea and vomiting.  Musculoskeletal: Positive for gait problem.  Skin: Negative.   Neurological: Negative for dizziness, tremors, seizures, syncope, facial asymmetry, speech difficulty and weakness.  Psychiatric/Behavioral: Negative.       Objective:   Physical Exam  Constitutional: He appears well-developed and well-nourished.  HENT:  Head: Normocephalic and atraumatic.  Eyes: EOM are normal.  Neck: Normal range of motion.  Cardiovascular: Normal rate and regular rhythm.  Pulmonary/Chest: Effort normal and breath sounds normal. No respiratory  distress. He has no wheezes. He has no rales.  Abdominal: Soft. Bowel sounds are normal. He exhibits no distension. There is no tenderness. There is no rebound.  Musculoskeletal: He exhibits no edema.  Neurological: He is alert. Coordination abnormal.  walker  Skin: Skin is warm and dry.  Psychiatric: He has a normal mood and affect.   Vitals:   05/24/18 1029  BP: 120/74  Pulse: 82  Temp: 97.6 F (36.4 C)  TempSrc: Oral  SpO2: 97%  Weight: 222 lb (100.7 kg)  Height: 6' (1.829 m)      Assessment & Plan:  B12 and flu shot given at visit

## 2018-05-24 NOTE — Assessment & Plan Note (Signed)
BP controlled off meds. Will keep off medications at this time. Checking BMP today.

## 2018-05-28 DIAGNOSIS — G4733 Obstructive sleep apnea (adult) (pediatric): Secondary | ICD-10-CM | POA: Diagnosis not present

## 2018-05-29 ENCOUNTER — Telehealth: Payer: Self-pay

## 2018-05-29 ENCOUNTER — Other Ambulatory Visit: Payer: Self-pay

## 2018-05-29 DIAGNOSIS — Z86711 Personal history of pulmonary embolism: Secondary | ICD-10-CM | POA: Diagnosis not present

## 2018-05-29 DIAGNOSIS — F321 Major depressive disorder, single episode, moderate: Secondary | ICD-10-CM | POA: Diagnosis not present

## 2018-05-29 DIAGNOSIS — R41841 Cognitive communication deficit: Secondary | ICD-10-CM | POA: Diagnosis not present

## 2018-05-29 DIAGNOSIS — I252 Old myocardial infarction: Secondary | ICD-10-CM | POA: Diagnosis not present

## 2018-05-29 DIAGNOSIS — F0280 Dementia in other diseases classified elsewhere without behavioral disturbance: Secondary | ICD-10-CM | POA: Diagnosis not present

## 2018-05-29 DIAGNOSIS — Z9181 History of falling: Secondary | ICD-10-CM | POA: Diagnosis not present

## 2018-05-29 DIAGNOSIS — I110 Hypertensive heart disease with heart failure: Secondary | ICD-10-CM | POA: Diagnosis not present

## 2018-05-29 DIAGNOSIS — G309 Alzheimer's disease, unspecified: Secondary | ICD-10-CM | POA: Diagnosis not present

## 2018-05-29 DIAGNOSIS — Z7982 Long term (current) use of aspirin: Secondary | ICD-10-CM | POA: Diagnosis not present

## 2018-05-29 DIAGNOSIS — I509 Heart failure, unspecified: Secondary | ICD-10-CM | POA: Diagnosis not present

## 2018-05-29 DIAGNOSIS — I2510 Atherosclerotic heart disease of native coronary artery without angina pectoris: Secondary | ICD-10-CM | POA: Diagnosis not present

## 2018-05-29 DIAGNOSIS — M1991 Primary osteoarthritis, unspecified site: Secondary | ICD-10-CM | POA: Diagnosis not present

## 2018-05-29 DIAGNOSIS — Z7902 Long term (current) use of antithrombotics/antiplatelets: Secondary | ICD-10-CM | POA: Diagnosis not present

## 2018-05-29 NOTE — Telephone Encounter (Signed)
Please advise per Dr. Crawford's absence. Thank you  

## 2018-05-29 NOTE — Telephone Encounter (Signed)
Okay to give verbal? 

## 2018-05-29 NOTE — Telephone Encounter (Signed)
LVM giving verbals 

## 2018-05-29 NOTE — Patient Outreach (Signed)
Transition of care:  Placed call to patient who reports that he is doing well. Reports 1 fall last week.  States that he is weighing but scale is not set right.  Appears that scale is set on kg vs pounds.  Reviewed with patient to get caregiver to assist with setting scale to pounds.    Placed call to son who reports that primary MD appointment went well. Reports he was taken off potassium. Reports that patient did fall and has bruising to face. Son reports that PT will come today.   Denies any other new concern today.  PLAN: will continue weekly transition of care calls.  Tomasa Rand, RN, BSN, CEN [facility] [employer] [phone]

## 2018-05-29 NOTE — Telephone Encounter (Signed)
Copied from [place] [phone]. Topic: Quick Communication - See Telephone Encounter >> May 29, 2018  3:19 PM Antonieta Iba C wrote: CRM for notification. See Telephone encounter for: 05/29/18.  [facility] Stabs [facility] called in to request PT orders.   Frequency: 1 week 1, 2 week 4 and then 1 week 1 to work on balance, gate and strengthening.    CB: [phone]

## 2018-05-30 ENCOUNTER — Other Ambulatory Visit: Payer: Self-pay

## 2018-05-30 ENCOUNTER — Telehealth: Payer: Self-pay | Admitting: Internal Medicine

## 2018-05-30 NOTE — Patient Outreach (Signed)
[facility]) Care Management  05/30/2018  JAICION LAURIE [birth year] [id number]  2:55 pm  BSW placed call to patients son to assist with community resource questions. Unfortunately, son is on another call and asked to contact this BSW later today. BSW will await return call from the patients son, Cayton Cuevas.   3:15 pm  Return call from the patients son, HIPAA identifiers confirmed. BSW introduced self to the patients son and the reason for patients referral. [name] reports he feels his dad will run out of money within the next few months. [name] states the patients goal is to remain in the home but he will need caregiver assistance in order to achieve this goal. The patients monthly income is over the [facility] eligibility limits. BSW explained that although the patients income is over the limit, he may still qualify based on assets and medical bills. [name] is in agreement to begin the application process.  BSW explained different avenues in applying including in person, submitting a paper application, or via on-line submission. [name] requests this BSW mail an application to his home address [phone] [place], [place] [address]). Bryan plans to assist the patient with this application process.   During today's call BSW also spoke with [name] about [facility] as an alternative if the patient is not approved for Medicaid coverage. BSW explained the process of paying a monthly payment to access this program.   Plan: BSW to mail a Medicaid application to the patients son's home address. BSW to follow up with [name] in 1 week to continue assisting with Medicaid application.  Daneen Schick, BSW, CDP [facility] [phone]

## 2018-05-30 NOTE — Telephone Encounter (Signed)
Copied from [place] [phone]. Topic: Quick Communication - See Telephone Encounter >> May 30, 2018  3:40 PM Ivar Drape wrote: CRM for notification. See Telephone encounter for: 05/30/18. Lowella Bandy an OT w/[facility] [phone] needs orders for  1w1, 2w4.

## 2018-05-30 NOTE — Telephone Encounter (Signed)
Please advise per Dr. Crawford's absence. Thank you  

## 2018-05-30 NOTE — Telephone Encounter (Signed)
Okay to give verbal? 

## 2018-05-31 NOTE — Telephone Encounter (Signed)
Verbals given  

## 2018-06-05 ENCOUNTER — Other Ambulatory Visit: Payer: Self-pay

## 2018-06-05 ENCOUNTER — Encounter

## 2018-06-05 ENCOUNTER — Ambulatory Visit (INDEPENDENT_AMBULATORY_CARE_PROVIDER_SITE_OTHER): Payer: PPO | Admitting: Neurology

## 2018-06-05 ENCOUNTER — Encounter: Payer: Self-pay | Admitting: Neurology

## 2018-06-05 VITALS — BP 130/78 | HR 72 | Ht 72.0 in | Wt 231.0 lb

## 2018-06-05 DIAGNOSIS — G214 Vascular parkinsonism: Secondary | ICD-10-CM | POA: Diagnosis not present

## 2018-06-05 DIAGNOSIS — F01518 Vascular dementia, unspecified severity, with other behavioral disturbance: Secondary | ICD-10-CM

## 2018-06-05 DIAGNOSIS — F0151 Vascular dementia with behavioral disturbance: Secondary | ICD-10-CM | POA: Diagnosis not present

## 2018-06-05 DIAGNOSIS — E538 Deficiency of other specified B group vitamins: Secondary | ICD-10-CM

## 2018-06-05 NOTE — Patient Outreach (Signed)
Transition of care:  1:08 pm place call to patient. No answer. Left a message requesting a call back 1:40 pm  Second call no answer. 1:44pm  Placed call to son, who reports that patient has a neurology appointment today and he is on his way to pick up his father. Reports no new falls. Reports everything is going well as far as he knows.   Reports he will check on weight log when he arrives.  PLAN: will continue weekly transition of care calls.  Tomasa Rand, RN, BSN, CEN [facility] [employer] [phone]

## 2018-06-05 NOTE — Patient Instructions (Addendum)
MRI of the brain  Vascular Dementia Dementia is a condition in which a person has problems with thinking, memory, and behavior that are severe enough to interfere with daily life. Vascular dementia is a type of dementia. It results from brain damage that is caused by the brain not getting enough blood. Vascular dementia usually begins between 74 and 72 years of age. What are the causes? Vascular dementia is caused by conditions that lessen blood flow to the brain. Common causes include:  Multiple small strokes. These may happen without symptoms (silent stroke).  Major stroke.  Damage to small blood vessels in the brain (cerebral small vessel disease).  What increases the risk?  Advancing age.  Having had a stroke.  Having high blood pressure (hypertension) or high cholesterol.  Having a disease that affects the heart or blood vessels.  Smoking.  Having diabetes.  Being male.  Being obese.  Not being active.  Having depression. What are the signs or symptoms? Symptoms can vary a lot from one person to another. Symptoms may be mild or severe depending on the amount of damage and which parts of the brain have been affected. Symptoms may begin suddenly or may develop gradually. Symptoms may remain stable, or they may get worse over time. Symptoms of vascular dementia may be similar to those of Alzheimer disease. The two conditions can occur together (mixed dementia). Symptoms of vascular dementia may include: Mental  Confusion.  Memory problems.  Poor attention and concentration.  Trouble understanding speech.  Depression.  Personality changes.  Trouble recognizing familiar people.  Agitation or aggression.  Paranoia.  Delusions or hallucinations. Physical  Weakness.  Poor balance.  Loss of bladder or bowel control (incontinence).  Unsteady walking (gait).  Speaking problems. Behavioral  Getting lost in familiar places.  Problems with planning and  judgment.  Trouble following instructions.  Social problems.  Emotional outbursts.  Trouble with daily activities and self-care.  Problems handling money. How is this diagnosed? There is not a specific test to diagnose vascular dementia. The health care provider will consider the person's medical history and symptoms or changes that are reported by friends and family. The health care provider will do a physical exam and may order lab tests or other tests that check brain and nervous system function. Tests that may be done include:  Blood tests.  Brain imaging tests.  Tests of movement, speech, and other daily activities (neurological exam).  Tests of memory, thinking, and problem-solving (neuropsychological or neurocognitive testing).  Diagnosis may involve several specialists. These may include a health care provider who specializes in the brain and nervous system (neurologist), a provider who specializes in disorders of the mind (psychiatrist), and a provider who focuses on speech and language changes (Electrical engineer). How is this treated? There is no cure for vascular dementia. Brain damage that has already occurred cannot be reversed. Treatment depends on:  How severe the condition is.  Which parts of the brain have been affected.  The person's overall health.  Treatment measures aim to:  Treat the underlying cause of vascular dementia and manage risk factors. This may include: ? Controlling blood pressure. ? Lowering cholesterol. ? Treating diabetes. ? Quitting smoking. ? Losing weight.  Manage symptoms.  Prevent further brain damage.  Improve the person's health and quality of life.  Treatment for dementia may involve a team of health care providers, including:  A neurologist.  A psychiatrist.  An occupational therapist.  A speech pathologist.  A cardiologist.  An exercise physiologist or physical therapist.  Follow these instructions at  home: Home care for a person with vascular dementia depends on what caused the condition and how severe the symptoms are. General guidelines for care at home include:  Following the health care provider's instructions for treating the condition that caused the dementia.  Using medicines only as told by the person's health care provider.  Creating a safe living space.  Learning ways to help the person remember people, appointments, and daily activities.  Finding a support group to help caregivers and family to cope with the effects of dementia.  Helping family and friends learn about ways to communicate with a person who has dementia.  Making sure the person keeps all follow-up visits and goes to all rehabilitation appointments as told by the health care team. This is important.  Contact a health care provider if:  A fever develops.  New behavioral problems develop.  Problems with swallowing develop.  Confusion gets worse.  Sleepiness gets worse. Get help right away if:  Loss of consciousness occurs.  There is a sudden loss of speech, balance, or thinking ability.  New numbness or paralysis occurs.  Sudden, severe headache occurs.  Vision is lost or suddenly gets worse in one or both eyes. This information is not intended to replace advice given to you by your health care provider. Make sure you discuss any questions you have with your health care provider. Document Released: 08/12/2002 Document Revised: 01/28/2016 Document Reviewed: 12/03/2014 Elsevier Interactive Patient Education  2018 [employer].   Dementia Dementia is the loss of two or more brain functions, such as:  Memory.  Decision making.  Behavior.  Speaking.  Thinking.  Problem solving.  There are many types of dementia. The most common type is called progressive dementia. Progressive dementia gets worse with time and it is irreversible. An example of this type of dementia is Alzheimer  disease. What are the causes? This condition may be caused by:  Nerve cell damage in the brain.  Genetic mutations.  Certain medicines.  Multiple small strokes.  An infection, such as chronic meningitis.  A metabolic problem, such as vitamin B12 deficiency or thyroid disease.  Pressure on the brain, such as from a tumor or blood clot.  What are the signs or symptoms? Symptoms of this condition include:  Sudden changes in mood.  Depression.  Problems with balance.  Changes in personality.  Poor short-term memory.  Agitation.  Delusions.  Hallucinations.  Having a hard time: ? Speaking thoughts. ? Finding words. ? Solving problems. ? Doing familiar tasks. ? Understanding familiar ideas.  How is this diagnosed? This condition is diagnosed with an assessment by your health care provider. During this assessment, your health care provider will talk with you and your family, friends, or caregivers about your symptoms. A thorough medical history will be taken, and you will have a physical exam and tests. Tests may include:  Lab tests, such as blood or urine tests.  Imaging tests, such as a CT scan, PET scan, or MRI.  A lumbar puncture. This test involves removing and testing a small amount of the fluid that surrounds the brain and spinal cord.  An electroencephalogram (EEG). In this test, small metal discs are used to measure electrical activity in the brain.  Memory tests, cognitive tests, and neuropsychological tests. These tests evaluate brain function.  How is this treated? Treatment depends on the cause of the dementia. It may involve taking medicines that may  help:  To control the dementia.  To slow down the disease.  To manage symptoms.  In some cases, treating the cause of the dementia can improve symptoms, reverse symptoms, or slow down how quickly the dementia gets worse. Your health care provider can help direct you to support groups,  organizations, and other health care providers who can help with decisions about your care. Follow these instructions at home: Medicine  Take over-the-counter and prescription medicines only as told by your health care provider.  Avoid taking medicines that can affect thinking, such as pain or sleeping medicines. Lifestyle   Make healthy lifestyle choices: ? Be physically active as told by your health care provider. ? Do not use any tobacco products, such as cigarettes, chewing tobacco, and e-cigarettes. If you need help quitting, ask your health care provider. ? Eat a healthy diet. ? Practice stress-management techniques when you get stressed. ? Stay social.  Drink enough fluid to keep your urine clear or pale yellow.  Make sure to get quality sleep. These tips can help you to get a good night's rest: ? Avoid napping during the day. ? Keep your sleeping area dark and cool. ? Avoid exercising during the few hours before you go to bed. ? Avoid caffeine products in the evening. General instructions  Work with your health care provider to determine what you need help with and what your safety needs are.  If you were given a bracelet that tracks your location, make sure to wear it.  Keep all follow-up visits as told by your health care provider. This is important. Contact a health care provider if:  You have any new symptoms.  You have problems with choking or swallowing.  You have any symptoms of a different illness. Get help right away if:  You develop a fever.  You have new or worsening confusion.  You have new or worsening sleepiness.  You have a hard time staying awake.  You or your family members become concerned for your safety. This information is not intended to replace advice given to you by your health care provider. Make sure you discuss any questions you have with your health care provider. Document Released: 02/15/2001 Document Revised: 12/31/2015 Document  Reviewed: 05/20/2015 Elsevier Interactive Patient Education  2018 [employer].  Recommendations to prevent or slow progression of cognitive decline:   Exercise You should increase exercise 30 to 45 minutes per day at least 3 days a week although 5 to 7 would be preferred. Any type of exercise (including walking) is acceptable although a recumbent bicycle may be best if you are unsteady. Disease related apathy can be a significant roadblock to exercise and the only way to overcome this is to make it a daily routine and perhaps have a reward at the end (something your loved one loves to eat or drink perhaps) or a personal trainer coming to the home can also be very useful. In general a structured, repetitive schedule is best.   Cardiovascular Health: You should optimize all cardiovascular risk factors (blood pressure, sugar, cholesterol) as vascular disease such as strokes and heart attacks can make memory problems much worse.   Diet: Eating a heart healthy (Mediterranean) diet is also a good idea; fish and poultry instead of red meat, nuts (mostly non-peanuts), vegetables, fruits, olive oil or canola oil (instead of butter), minimal salt (use other spices to flavor foods), whole grain rice, bread, cereal and pasta and wine in moderation.  General Health: Any diseases which  effect your body will effect your brain such as a pneumonia, urinary infection, blood clot, heart attack or stroke. Keep contact with your primary care doctor for regular follow ups.  Sleep. A good nights sleep is healthy for the brain. Seven hours is recommended. If you have insomnia or poor sleep habits see the recommendations below  Tips: Structured and consistent daytime and nighttime routine, including regular wake times, bedtimes, and mealtimes, will be important for the patient to avoid confusion. Keeping frequently used items in designated places will help reduce stress from searching. If there are worries about getting  lost do not let the patient leave home unaccompanied. They might benefit from wearing an identification bracelet that will help others assist in finding home if they become lost. Information about nationwide safe return services and other helpful resources may be obtained through the Alzheimer's Association helpline at [phone].  Finances, Power of Producer, television/film/video Directives: You should consider putting legal safeguards in place with regard to financial and medical decision making. While the spouse always has power of attorney for medical and financial issues in the absence of any form, you should consider what you want in case the spouse / caregiver is no longer around or capable of making decisions.   [place] : http://www.welch.com/.pdf  Or Google "[place]" AND "An Forensic scientist for [employer]  Other States: ApartmentMom.com.ee  The signature on these forms should be notarized.   DRIVING:   Driving only during the day Drive only to familiar Locations Avoid driving during bad weather  If you would like to be tested to see if you are driving safely, [facility] has a Clinical Driving Evaluation. To schedule an appointment call [phone].                RESOURCES:  Memory Loss: Improve your short term memory By Silvio Pate  The Alzheimer's Reading Room http://www.alzheimersreadingroom.com/   The Alzheimer's Compendium http://www.alzcompend.info/  [employer] www.dukefamilysupport.QBH [phone]  Recommended resources for caregivers (All can be purchased on [employer]):  1) A Caregiver's Guide to Dementia: Using Activities and Other Strategies to Prevent, Reduce and Manage Behavioral Symptoms by Osie Bond. Tyler Aas and [employer]   2) A Caregiver's Guide to [employer] Dementia by Caleen Essex MS BSN and Gaston Islam   3) What If It's Not Alzheimer's?: A Caregiver's Guide to Dementia by Koren Shiver (Author), Octaviano Batty (Editor)  3) The 36 hour day by Rabins and Mace  4) Understanding Difficult Behaviors by Merita Norton and White  Online course for helping caregivers reduce stress, guilt and frustration called the Caregivers Helpbook. The website is www.powerfultoolsforcaregivers.org  As a caregiver you are a Art gallery manager. Problems you face as a caregiver are usually unique to your situation and the way your loved-one's disease manifests itself. The best way to use these books is to look at the Table of Contents and read any chapters of interest or that apply to challenges you are having as a caregiver.  NATIONAL RESOURCES: For more information on neurological disorders or research programs funded by the [employer] of Neurological Disorders and Stroke, contact the Institute's Agricultural consultant (BRAIN) at: [place] [address] [phone] (toll-free) MasterBoxes.it  Information on dementia is also available from the following organizations: Alzheimer's Disease Education and Referral ([place]) [facility] [address] [place] [address] [phone] (toll-free) DVDEnthusiasts.nl  Alzheimer's Association [address] [place], [place]  [address] [phone] (toll-free, 24-hour helpline) [phone] (TDD) CapitalMile.co.nz  Alzheimer's Foundation of America [address], [place] [address] [phone] (toll-free) www.alzfdn.org  [facility] [address], [place] [address] [phone] www.alzdiscovery.org  Association for [place] [address] [place] [address] [phone] (toll-free) [place] [address] [phone] (toll-free) www.brightfocus.org/alzheimers  Doran Stabler [facility] Alzheimer's [facility] [address], [place] [address] [phone] www.https://lambert-jackson.net/  Lewy Body Dementia Association [address], [place] [address] [phone] (toll-free LBD Caregiver Link) www.lbda.[place], [place], [place] [address] [phone] (toll-free) [phone] [facility]) https://carter.com/  National Organization for Rare Disorders [address] [place] [address] 8-251-898-MKJI [phone]) (toll-free) www.rarediseases.org  The Dementias: Hope Through Research was jointly produced by the [employer] of Neurological Disorders and Stroke (NINDS) and the [employer] on Aging (NIA), both part of the [employer], the [employer] research agency-supporting scientific studies that turn discovery into health. NINDS is the nation's leading funder of research on the brain and nervous system. The NINDS mission is to reduce the burden of neurological disease. For more information and resources, visit MasterBoxes.it [1] or call [phone]. NIA leads the federal government effort conducting and supporting research on aging and the health and well-being of older people. NIA's Alzheimer's Disease Education and Referral (ADEAR) Center offers information and publications on dementia and caregiving for families, caregivers, and professionals. For more information, visit DVDEnthusiasts.nl [2] or call ([phone]. Also available from NIA are publications and information about Alzheimer's disease as well as the booklets Frontotemporal Disorders: Information for Patients, Families, and Caregivers and Lewy Body Dementia: Information for Patients, Families, and  Professionals. Source URL: SocialSpecialists.co.nz

## 2018-06-05 NOTE — Progress Notes (Signed)
[facility]    Provider:  Dr Jaynee Eagles Referring Provider: Hoyt Koch, * Primary Care Physician:  Hoyt Koch, MD  CC:  Memory loss  HPI:  John Blackburn is a 72 y.o. male here as requested by Dr. Sharlet Salina for memory loss. PMHx OSA, hoarding behavior, MI, HTN, HLD, depression. About a month age he got sick and felt like he was losing his thught process. He was struggling to answer questions. Son is here maybe started last year and the end of June they were driving out of [place] and forgot how to get there and he has done the rip 100s of time, he got lost for 14 hours. He has a history of depression. Patient rents a small apartment in [place] and rents from his friend.  Recently not driving.  Son helps with finances. Son started helping 3-4 months ago, things were not paid, bills stacking up. Son bought him a new machine that helps with medication. Medication was not being taken. He does not cook or shop or clean. He has clutter and hoarding problems for years. He has had several falls. Short ter memory. More progressive short-term memory.   Reviewed notes, labs and imaging from outside physicians, which showed:  04/2018: CT head IMPRESSION:personally reviewed imaging and agree with the following 1.  No acute intracranial abnormality. 2. Unchanged advanced chronic small vessel ischemia, remote lacunar infarcts and generalized atrophy.  TSH nml, B12 210  Review of Systems: Patient complains of symptoms per HPI as well as the following symptoms: memory loss. Pertinent negatives and positives per HPI. All others negative.   Social History   Socioeconomic History  . Marital status: Divorced    Spouse name: Not on file  . Number of children: 3  . Years of education: 80  . Highest education level: Not on file  Occupational History  . 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 Financial resource strain: Somewhat hard  . Food  insecurity:    Worry: Sometimes true    Inability: Sometimes true  . Transportation needs:    Medical: No    Non-medical: No  Tobacco Use  . Smoking status: Never Smoker  . Smokeless tobacco: Never Used  Substance and Sexual Activity  . Alcohol use: Yes    Alcohol/week: 1.0 standard drinks    Types: 1 Standard drinks or equivalent per week    Comment: social  . Drug use: No  . Sexual activity: Not Currently    Partners: Female  Lifestyle  . Physical activity:    Days per week: 3 days    Minutes per session: 40 min  . Stress: To some extent  Relationships  . Social connections:    Talks on phone: Once a week    Gets together: Once a week    Attends religious service: More than 4 times per year    Active member of club or organization: Yes    Attends meetings of clubs or organizations: 1 to 4 times per year    Relationship status: Divorced  . Intimate partner violence:    Fear of current or ex partner: Not on file    Emotionally abused: Not on file    Physically abused: Not on file    Forced sexual activity: Not on file  Other Topics Concern  . 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 3 sons - '74, '76, '87.  1 grandchild.  Work - retired Occupational psychologist. Lives alone. No pets. Serially monogamous.   ACP - he has a living will: DNR, DNI.   Pt is a full code   Right handed        Family History  Problem Relation Age of Onset  . Stroke Mother   . Hypertension Mother   . Heart disease Mother   . Heart disease Father        CAD/MI-fatal sudden death  . Colon cancer Father   . Colon cancer Sister        colon cancer 20090-survivor  . Heart disease Paternal Uncle   . Colon cancer Paternal Uncle   . Heart disease Paternal Grandfather   . Colon cancer Paternal Aunt   . Colon cancer Cousin   . Colon cancer Cousin   . Colon cancer Paternal Uncle   . Stomach cancer Neg Hx   .  Rectal cancer Neg Hx   . Esophageal cancer Neg Hx     Past Medical History:  Diagnosis Date  . Allergy   . 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 CAD (coronary artery disease)    08/2017 PCI/DES mRCA, RPDA, CTO pf pLCX with collaterals, normal EF  . Depression    on multiple meds. Has been seen at [facility]. and Dr. Sabra Heck is his prescriber  . Diverticulitis of colon 2000 's   treated as an outpatient  . GERD (gastroesophageal reflux disease)    UGI done August '12 - ulcer/. Dr. Ferdinand Lango, gastroenterologist in [facility].   [name] Hx of adenomatous colonic polyps   . Hyperlipidemia    last lipid panel: HDL 44, LDL 117  . Hypertension   . Myocardial infarction ([place])   . Peptic ulcer    in the past and just recently-August '12  . Pulmonary emboli [facility])    noted October 2014 - treated by Dr. Linda Hedges  . skin cancer    skin CA  . Sleep apnea, primary central    wears CPAP    Past Surgical History:  Procedure Laterality Date  . ANAL RECTAL MANOMETRY N/A 11/30/2016   Procedure: ANO RECTAL MANOMETRY;  Surgeon: Mauri Pole, MD;  Location: [facility];  Service: Endoscopy;  Laterality: N/A;  . CARDIAC CATHETERIZATION    . CHOLECYSTECTOMY  1990   laproscopic   . CORONARY STENT INTERVENTION N/A 09/01/2017   Procedure: CORONARY STENT INTERVENTION;  Surgeon: Jettie Booze, MD;  Location: [facility];  Service: Cardiovascular;  Laterality: N/A;  . HERNIA REPAIR    . JOINT REPLACEMENT     knee  . PROSTATE SURGERY     needle biopsy's, TURP  . RIGHT/LEFT HEART CATH AND CORONARY ANGIOGRAPHY N/A 09/01/2017   Procedure: RIGHT/LEFT HEART CATH AND CORONARY ANGIOGRAPHY;  Surgeon: Jettie Booze, MD;  Location: [facility];  Service: Cardiovascular;  Laterality: N/A;  . TKR bilateral  2006   Dr. Violet Baldy  . UPPER GASTROINTESTINAL ENDOSCOPY    . VASECTOMY      Current Outpatient Medications  Medication Sig Dispense Refill  . aspirin EC  81 MG EC tablet Take 1 tablet (81 mg total) by mouth daily.    [name] atorvastatin (LIPITOR) 80 MG tablet TAKE 1 TABLET BY MOUTH ONCE DAILY AT  6PM (Patient taking differently: Take 80 mg by mouth daily at 6 PM. ) 90 tablet 1  . buPROPion (WELLBUTRIN XL) 300 MG 24 hr tablet TAKE 1  TABLET BY MOUTH ONCE DAILY 30 tablet 2  . clopidogrel (PLAVIX) 75 MG tablet Take 1 tablet (75 mg total) by mouth daily. 90 tablet 2  . escitalopram (LEXAPRO) 10 MG tablet Take 1 tablet (10 mg total) by mouth daily. 30 tablet 6  . loratadine (CLARITIN) 10 MG tablet Take 10 mg by mouth daily as needed for allergies.     . Melatonin 1 MG/ML LIQD Take 2.5 mg by mouth at bedtime as needed (sleep).     . memantine (NAMENDA XR) 7 MG CP24 24 hr capsule Take 1 capsule (7 mg total) by mouth daily. 30 capsule 3  . pantoprazole (PROTONIX) 40 MG tablet TAKE 1 TABLET BY MOUTH ONCE DAILY **PLEASE  CALL  TO  MAKE  APPOINTMENT  FOR  FUTHER  REFILLS** (Patient taking differently: Take 40 mg by mouth daily. ) 30 tablet 1  . nitroGLYCERIN (NITROSTAT) 0.4 MG SL tablet Place 1 tablet (0.4 mg total) under the tongue every 5 (five) minutes as needed. 25 tablet 2   No current facility-administered medications for this visit.     Allergies as of 06/05/2018 - Review Complete 06/05/2018  Allergen Reaction Noted  . Cephalexin Rash 05/03/2011  . Levofloxacin Rash 05/03/2011    Vitals: BP 130/78 (BP Location: Left Arm, Patient Position: Sitting)   Pulse 72   Ht 6' (1.829 m)   Wt 231 lb (104.8 kg)   BMI 31.33 kg/m  Last Weight:  Wt Readings from Last 1 Encounters:  06/05/18 231 lb (104.8 kg)   Last Height:   Ht Readings from Last 1 Encounters:  06/05/18 6' (1.829 m)     Physical exam: Exam: Gen: NAD                    CV: RRR, no MRG. No Carotid Bruits. No peripheral edema, warm, nontender Eyes: Conjunctivae clear without exudates or hemorrhage  MMSE - Mini Mental State Exam 06/05/2018 10/13/2015  Not completed: - (No Data)    Orientation to time 4 -  Orientation to Place 4 -  Registration 3 -  Attention/ Calculation 1 -  Recall 0 -  Language- name 2 objects 2 -  Language- repeat 0 -  Language- follow 3 step command 2 -  Language- read & follow direction 1 -  Write a sentence 1 -  Copy design 0 -  Total score 18 -    Neuro: Detailed Neurologic Exam  Speech:    Speech is without aphasia or dysarthria Cognition:     MMSE - Mini Mental State Exam 06/05/2018 10/13/2015  Not completed: - (No Data)  Orientation to time 4 -  Orientation to Place 4 -  Registration 3 -  Attention/ Calculation 1 -  Recall 0 -  Language- name 2 objects 2 -  Language- repeat 0 -  Language- follow 3 step command 2 -  Language- read & follow direction 1 -  Write a sentence 1 -  Copy design 0 -  Total score 18 -    Cranial Nerves: Masked Facies    The pupils are pinpoint but equal, round, and reactive to light.Attempted fundoscopy could not visualize due to small pupiles. Visual fields are full to finger confrontation. Extraocular movements are intact. Trigeminal sensation is intact and the muscles of mastication are normal. The face is symmetric. The palate elevates in the midline. Hearing intact. Voice is normal. Shoulder shrug is normal. The tongue has normal motion without fasciculations.   Coordination:  No dysmetria   Gait:    Slightly stooped, shuffling, decreased arm swing, bradykinetic  Motor Observation:    Mild action tremor, no resting tremor, abnormal mouth movement likely tardive dyskinesias Tone:    No cogwheeling    Strength:    Strength is V/V in the upper and lower limbs.      Sensation: intact to LT     Reflex Exam:  DTR's:    Deep tendon reflexes in the upper and lower extremities are symmetrical bilaterally.   Toes:    The toes are downgoing bilaterally.   Clonus:    Clonus is absent.   Assessment/Plan:  72 year old with dementia. Imaging shows advanced chronic small vessel ischemia,  remote lacunar infarcts and generalized atrophy.   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 Si Raider scheduled, this is very important discussed - Son has POA and HCPOA and is involved with father's daily medication and finances - Will have them back in 4 weeks to review MRI and then in March after Formal Neurocog testing complete - He could not tolerate Aricept. On Namenda at this time, can continue but limited data in Vascular Dementia - Discussed causes of advanced chronic small vessel ischemia, needs close management of vascular risk factors with pcp - at next appointment can provide info on [facility], PACE program, provided online resources and books to read - f/u 4 weeks - fall risk, discussed fall precautions, use walking aid at all time   Orders Placed This Encounter  Procedures  . MR BRAIN W WO CONTRAST     A total of 90 minutes was spent face-to-face with this patient and son. Over half this time was spent on counseling patient on the  1. 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 B12 deficiency    diagnosis and different diagnostic and therapeutic options, counseling and coordination of care, risks ans benefits of management, compliance, or risk factor reduction and education.        Sarina Ill, MD  [facility] [address] [place], [place] [address]  Phone [phone] Fax [phone]

## 2018-06-06 ENCOUNTER — Encounter: Payer: Self-pay | Admitting: Neurology

## 2018-06-06 ENCOUNTER — Telehealth: Payer: Self-pay | Admitting: Neurology

## 2018-06-06 NOTE — Telephone Encounter (Signed)
Health team order sent to GI. No auth they will reach out to the pt to schedule.  °

## 2018-06-07 ENCOUNTER — Other Ambulatory Visit: Payer: Self-pay

## 2018-06-07 NOTE — Patient Outreach (Signed)
[facility]) Care Management  06/07/2018  John Blackburn [birth year] [id number]  Successful call to the patients son who is able to confirm receipt of Medicaid application. [name] states he has completed the Medicaid application and plans to see his father Saturday to have him sign the document in order to submit to DSS. [name] discussed a recent visit with Dr. Jaynee Eagles who had a talk with the patient about long-term care options. [name] feels his dad will need to move into a long-term care setting "by the first quarter". BSW to sign off on case and refer CSW to assist with long-term care Medicaid questions as well as assistance with placement.   Daneen Schick, BSW, CDP [facility] [phone]

## 2018-06-07 NOTE — Addendum Note (Signed)
Addended byDaneen Schick on: 06/07/2018 02:03 PM   Modules accepted: Orders

## 2018-06-11 ENCOUNTER — Other Ambulatory Visit: Payer: Self-pay | Admitting: Internal Medicine

## 2018-06-11 DIAGNOSIS — F32A Depression, unspecified: Secondary | ICD-10-CM

## 2018-06-11 DIAGNOSIS — F329 Major depressive disorder, single episode, unspecified: Secondary | ICD-10-CM

## 2018-06-12 ENCOUNTER — Other Ambulatory Visit: Payer: Self-pay

## 2018-06-12 ENCOUNTER — Other Ambulatory Visit: Payer: Self-pay | Admitting: *Deleted

## 2018-06-12 NOTE — Patient Outreach (Signed)
Transition of care:  Placed call to patient who reports that he is doing well. Reports that he is eating and drinking well and taking his medications as prescribed. Reports todays weight of 220 pounds.  PLAN: patient has successfully completed transition of care program. Will plan follow up in 2 weeks. Active with [facility] social  Worker at this time.  Tomasa Rand, RN, BSN, CEN [facility] [employer] [phone]

## 2018-06-12 NOTE — Patient Outreach (Signed)
[facility]) Care Management  06/12/2018  John Blackburn July 27, 1946 [id number]   CSW was able to make contact with pt's son, [name], today by phone. Pt's identity confirmed and pt's son was expecting my call.  CSW inquired about their interest and needs; to which son shared, "he is going to run out of money and needs assisted living".  CSW discussed the process briefly and will mail pt's son info on ALF's for him to begin researching and consideration. Pt's son is not ready to place him but feels like "in the next quarter" he will. CSW discussed the importance of planning ahead with Medicaid application, searching/touring facilities that may be of interest as well as PCP completing an FL2.  Pt's son declines CSW home visit at this time. He requests a follow up call in about 2 weeks to check in and follow up on resources mailed and further planning. CSW has updated  [facility] RNCM and will also route CSW note to PCP.  Eduard Clos, MSW, [place] Worker  [place] [phone]

## 2018-06-13 NOTE — Patient Outreach (Signed)
[facility]) Care Management  06/13/2018  BROXTON BROADY 01/03/1946 [id number]  BSW mailed resources to the patients son, Aundrea Higginbotham, as requested by CSW Eduard Clos.  Daneen Schick, BSW, CDP [facility] [phone]

## 2018-06-14 DIAGNOSIS — F321 Major depressive disorder, single episode, moderate: Secondary | ICD-10-CM

## 2018-06-14 DIAGNOSIS — M1991 Primary osteoarthritis, unspecified site: Secondary | ICD-10-CM | POA: Diagnosis not present

## 2018-06-14 DIAGNOSIS — Z7902 Long term (current) use of antithrombotics/antiplatelets: Secondary | ICD-10-CM

## 2018-06-14 DIAGNOSIS — I2510 Atherosclerotic heart disease of native coronary artery without angina pectoris: Secondary | ICD-10-CM

## 2018-06-14 DIAGNOSIS — I110 Hypertensive heart disease with heart failure: Secondary | ICD-10-CM | POA: Diagnosis not present

## 2018-06-14 DIAGNOSIS — Z86711 Personal history of pulmonary embolism: Secondary | ICD-10-CM

## 2018-06-14 DIAGNOSIS — Z7982 Long term (current) use of aspirin: Secondary | ICD-10-CM

## 2018-06-14 DIAGNOSIS — Z9181 History of falling: Secondary | ICD-10-CM

## 2018-06-14 DIAGNOSIS — I252 Old myocardial infarction: Secondary | ICD-10-CM

## 2018-06-14 DIAGNOSIS — F0280 Dementia in other diseases classified elsewhere without behavioral disturbance: Secondary | ICD-10-CM | POA: Diagnosis not present

## 2018-06-14 DIAGNOSIS — R41841 Cognitive communication deficit: Secondary | ICD-10-CM

## 2018-06-14 DIAGNOSIS — I509 Heart failure, unspecified: Secondary | ICD-10-CM | POA: Diagnosis not present

## 2018-06-14 DIAGNOSIS — G309 Alzheimer's disease, unspecified: Secondary | ICD-10-CM | POA: Diagnosis not present

## 2018-06-22 ENCOUNTER — Other Ambulatory Visit: Payer: Self-pay

## 2018-06-22 NOTE — Patient Outreach (Signed)
Telephone follow up: Placed call to patient for follow up. Patient answered the phone and reports that he is doing well. Reports that he is eating well with a caregiver cooking meals. Reports weight is unchanged. Reports slight swelling in his legs.  Denies any new falls.   PLAN: will close to community nursing at this time and transfer to the health coach for ongoing CHF management.  Patient remains active with [facility] Education officer, museum.  Tomasa Rand, RN, BSN, CEN [facility] [employer] [phone]

## 2018-06-25 ENCOUNTER — Other Ambulatory Visit: Payer: Self-pay | Admitting: *Deleted

## 2018-06-25 NOTE — Patient Outreach (Signed)
[facility]) Care Management  06/25/2018  SUE MCALEXANDER 23-Sep-1945 [id number]   CSW was able to make contact with pt's son, [name], by phone. He reports no changes in pt's condition and plans to complete and submit Medicaid application asap.  CSW reminded him that the process for Medicaid determination can take up to 45 days. CSW encouraged him to also review and visit/tour possible ALF preferences.   CSW will contact PCP for FL2 completion and plan f/u call to family in 1-2 weeks.      Eduard Clos, MSW, [place] Worker  [place] [phone]

## 2018-06-26 ENCOUNTER — Encounter: Payer: Self-pay | Admitting: *Deleted

## 2018-06-27 DIAGNOSIS — G4733 Obstructive sleep apnea (adult) (pediatric): Secondary | ICD-10-CM | POA: Diagnosis not present

## 2018-07-01 ENCOUNTER — Ambulatory Visit
Admission: RE | Admit: 2018-07-01 | Discharge: 2018-07-01 | Disposition: A | Discharge status: Home / Self Care | Payer: PPO | Source: Ambulatory Visit | Attending: Neurology | Admitting: Neurology

## 2018-07-01 DIAGNOSIS — F0151 Vascular dementia with behavioral disturbance: Secondary | ICD-10-CM

## 2018-07-01 DIAGNOSIS — F01518 Vascular dementia, unspecified severity, with other behavioral disturbance: Secondary | ICD-10-CM

## 2018-07-01 DIAGNOSIS — G214 Vascular parkinsonism: Secondary | ICD-10-CM | POA: Diagnosis not present

## 2018-07-01 MED ORDER — GADOBENATE DIMEGLUMINE 529 MG/ML IV SOLN
20.0000 mL | Freq: Once | INTRAVENOUS | Status: AC | PRN
  Administered 2018-07-01: 20 mL via INTRAVENOUS

## 2018-07-01 MED ORDER — GADOBENATE DIMEGLUMINE 529 MG/ML IV SOLN: 20.0000 mL | Freq: Once | INTRAVENOUS | Status: DC | PRN

## 2018-07-03 ENCOUNTER — Encounter: Payer: Self-pay | Admitting: Neurology

## 2018-07-03 ENCOUNTER — Ambulatory Visit (INDEPENDENT_AMBULATORY_CARE_PROVIDER_SITE_OTHER): Payer: PPO | Admitting: Neurology

## 2018-07-03 DIAGNOSIS — F0150 Vascular dementia without behavioral disturbance: Secondary | ICD-10-CM | POA: Diagnosis not present

## 2018-07-03 NOTE — Progress Notes (Signed)
[facility]    Provider:  Dr Jaynee Eagles Referring Provider: Hoyt Koch, * Primary Care Physician:  Hoyt Koch, MD  CC:  Memory loss  Interval history 07/03/2018: Reviewd MRI of the brain today which showed advanced microvascular ischemic changes and multiple lacunar strokes. Reviewed images with patient and son. Discussed that this correlates with vascular dementia. The next step is for formal neurocgnitive testing. Answered all questions.  HPI:  John Blackburn is a 72 y.o. male here as requested by Dr. Sharlet Salina for memory loss. PMHx OSA, hoarding behavior, MI, HTN, HLD, depression. About a month age he got sick and felt like he was losing his thught process. He was struggling to answer questions. Son is here maybe started last year and the end of June they were driving out of [place] and forgot how to get there and he has done the rip 100s of time, he got lost for 14 hours. He has a history of depression. Patient rents a small apartment in [place] and rents from his friend.  Recently not driving.  Son helps with finances. Son started helping 3-4 months ago, things were not paid, bills stacking up. Son bought him a new machine that helps with medication. Medication was not being taken. He does not cook or shop or clean. He has clutter and hoarding problems for years. He has had several falls. Short ter memory. More progressive short-term memory.   Reviewed notes, labs and imaging from outside physicians, which showed:  04/2018: CT head IMPRESSION:personally reviewed imaging and agree with the following 1.  No acute intracranial abnormality. 2. Unchanged advanced chronic small vessel ischemia, remote lacunar infarcts and generalized atrophy.  TSH nml, B12 210  Review of Systems: Patient complains of symptoms per HPI as well as the following symptoms: memory loss. Pertinent negatives and positives per HPI. All others negative.   Social History     Socioeconomic History  . Marital status: Divorced    Spouse name: Not on file  . Number of children: 3  . Years of education: 63  . Highest education level: Not on file  Occupational History  . 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 Financial resource strain: Somewhat hard  . Food insecurity:    Worry: Sometimes true    Inability: Sometimes true  . Transportation needs:    Medical: No    Non-medical: No  Tobacco Use  . Smoking status: Never Smoker  . Smokeless tobacco: Never Used  Substance and Sexual Activity  . Alcohol use: Yes    Alcohol/week: 1.0 standard drinks    Types: 1 Standard drinks or equivalent per week    Comment: social  . Drug use: No  . Sexual activity: Not Currently    Partners: Female  Lifestyle  . Physical activity:    Days per week: 3 days    Minutes per session: 40 min  . Stress: To some extent  Relationships  . Social connections:    Talks on phone: Once a week    Gets together: Once a week    Attends religious service: More than 4 times per year    Active member of club or organization: Yes    Attends meetings of clubs or organizations: 1 to 4 times per year    Relationship status: Divorced  . Intimate partner violence:    Fear of current or ex partner: Not on file    Emotionally abused: Not on file    Physically abused: Not  on file    Forced sexual activity: Not on file  Other Topics Concern  . 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 3 sons - '74, '76, '87.  1 grandchild.   Work - retired Occupational psychologist. Lives alone. No pets. Serially monogamous.   ACP - he has a living will: DNR, DNI.   Pt is a full code   Right handed        Family History  Problem Relation Age of Onset  . Stroke Mother   . Hypertension Mother   . Heart disease Mother   . Heart disease Father        CAD/MI-fatal sudden death  . Colon cancer Father   . Colon  cancer Sister        colon cancer 20090-survivor  . Heart disease Paternal Uncle   . Colon cancer Paternal Uncle   . Heart disease Paternal Grandfather   . Colon cancer Paternal Aunt   . Colon cancer Cousin   . Colon cancer Cousin   . Colon cancer Paternal Uncle   . Stomach cancer Neg Hx   . Rectal cancer Neg Hx   . Esophageal cancer Neg Hx     Past Medical History:  Diagnosis Date  . Allergy   . 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 CAD (coronary artery disease)    08/2017 PCI/DES mRCA, RPDA, CTO pf pLCX with collaterals, normal EF  . Depression    on multiple meds. Has been seen at [facility]. and Dr. Sabra Heck is his prescriber  . Diverticulitis of colon 2000 's   treated as an outpatient  . GERD (gastroesophageal reflux disease)    UGI done August '12 - ulcer/. Dr. Ferdinand Lango, gastroenterologist in [facility].   [name] Hx of adenomatous colonic polyps   . Hyperlipidemia    last lipid panel: HDL 44, LDL 117  . Hypertension   . Myocardial infarction ([place])   . Peptic ulcer    in the past and just recently-August '12  . Pulmonary emboli [facility])    noted October 2014 - treated by Dr. Linda Hedges  . skin cancer    skin CA  . Sleep apnea, primary central    wears CPAP    Past Surgical History:  Procedure Laterality Date  . ANAL RECTAL MANOMETRY N/A 11/30/2016   Procedure: ANO RECTAL MANOMETRY;  Surgeon: Mauri Pole, MD;  Location: [facility];  Service: Endoscopy;  Laterality: N/A;  . CARDIAC CATHETERIZATION    . CHOLECYSTECTOMY  1990   laproscopic   . CORONARY STENT INTERVENTION N/A 09/01/2017   Procedure: CORONARY STENT INTERVENTION;  Surgeon: Jettie Booze, MD;  Location: [place] [facility];  Service: Cardiovascular;  Laterality: N/A;  . HERNIA REPAIR    . JOINT REPLACEMENT     knee  . PROSTATE SURGERY     needle biopsy's, TURP  . RIGHT/LEFT HEART CATH AND CORONARY ANGIOGRAPHY N/A 09/01/2017   Procedure: RIGHT/LEFT HEART CATH AND CORONARY  ANGIOGRAPHY;  Surgeon: Jettie Booze, MD;  Location: [place] [facility];  Service: Cardiovascular;  Laterality: N/A;  . TKR bilateral  2006   Dr. Violet Baldy  . UPPER GASTROINTESTINAL ENDOSCOPY    . VASECTOMY      Current Outpatient Medications  Medication Sig Dispense Refill  . aspirin EC 81 MG EC tablet Take 1 tablet (81 mg total) by mouth daily.    [name]  atorvastatin (LIPITOR) 80 MG tablet TAKE 1 TABLET BY MOUTH ONCE DAILY AT  6PM (Patient taking differently: Take 80 mg by mouth daily at 6 PM. ) 90 tablet 1  . buPROPion (WELLBUTRIN XL) 300 MG 24 hr tablet TAKE 1 TABLET BY MOUTH ONCE DAILY 90 tablet 3  . clopidogrel (PLAVIX) 75 MG tablet Take 1 tablet (75 mg total) by mouth daily. 90 tablet 2  . escitalopram (LEXAPRO) 10 MG tablet Take 1 tablet (10 mg total) by mouth daily. 30 tablet 6  . loratadine (CLARITIN) 10 MG tablet Take 10 mg by mouth daily as needed for allergies.     . Melatonin 1 MG/ML LIQD Take 2.5 mg by mouth at bedtime as needed (sleep).     . memantine (NAMENDA XR) 7 MG CP24 24 hr capsule Take 1 capsule (7 mg total) by mouth daily. 30 capsule 3  . nitroGLYCERIN (NITROSTAT) 0.4 MG SL tablet Place 1 tablet (0.4 mg total) under the tongue every 5 (five) minutes as needed. 25 tablet 2  . pantoprazole (PROTONIX) 40 MG tablet TAKE 1 TABLET BY MOUTH ONCE DAILY **PLEASE  CALL  TO  MAKE  APPOINTMENT  FOR  FUTHER  REFILLS** (Patient taking differently: Take 40 mg by mouth daily. ) 30 tablet 1  . UNABLE TO FIND Take by mouth. Med Name: penicillin course for 7 days     No current facility-administered medications for this visit.     Allergies as of 07/03/2018 - Review Complete 07/03/2018  Allergen Reaction Noted  . Cephalexin Rash 05/03/2011  . Levofloxacin Rash 05/03/2011    Vitals: BP 133/79 (BP Location: Right Arm, Patient Position: Sitting)   Pulse 76   Ht 6' (1.829 m)   Wt 227 lb (103 kg)   BMI 30.79 kg/m  Last Weight:  Wt Readings from Last 1 Encounters:    07/03/18 227 lb (103 kg)   Last Height:   Ht Readings from Last 1 Encounters:  07/03/18 6' (1.829 m)     Physical exam: Exam: Gen: NAD                    CV: RRR, no MRG. No Carotid Bruits. No peripheral edema, warm, nontender Eyes: Conjunctivae clear without exudates or hemorrhage  MMSE - Mini Mental State Exam 06/05/2018 10/13/2015  Not completed: - (No Data)  Orientation to time 4 -  Orientation to Place 4 -  Registration 3 -  Attention/ Calculation 1 -  Recall 0 -  Language- name 2 objects 2 -  Language- repeat 0 -  Language- follow 3 step command 2 -  Language- read & follow direction 1 -  Write a sentence 1 -  Copy design 0 -  Total score 18 -    Neuro: Detailed Neurologic Exam  Speech:    Speech is without aphasia or dysarthria Cognition:     MMSE - Mini Mental State Exam 06/05/2018 10/13/2015  Not completed: - (No Data)  Orientation to time 4 -  Orientation to Place 4 -  Registration 3 -  Attention/ Calculation 1 -  Recall 0 -  Language- name 2 objects 2 -  Language- repeat 0 -  Language- follow 3 step command 2 -  Language- read & follow direction 1 -  Write a sentence 1 -  Copy design 0 -  Total score 18 -    Cranial Nerves: Masked Facies    The pupils are pinpoint but equal, round, and reactive to light.Attempted fundoscopy  could not visualize due to small pupiles. Visual fields are full to finger confrontation. Extraocular movements are intact. Trigeminal sensation is intact and the muscles of mastication are normal. The face is symmetric. The palate elevates in the midline. Hearing intact. Voice is normal. Shoulder shrug is normal. The tongue has normal motion without fasciculations.   Coordination:    No dysmetria   Gait:    Slightly stooped, shuffling, decreased arm swing, bradykinetic  Motor Observation:    Mild action tremor, no resting tremor, abnormal mouth movement likely tardive dyskinesias Tone:    No cogwheeling    Strength:     Strength is V/V in the upper and lower limbs.      Sensation: intact to LT     Reflex Exam:  DTR's:    Deep tendon reflexes in the upper and lower extremities are symmetrical bilaterally.   Toes:    The toes are downgoing bilaterally.   Clonus:    Clonus is absent.   Assessment/Plan:  72 year old with dementia. Imaging shows advanced chronic small vessel ischemia, remote lacunar infarcts and generalized atrophy.   - MRI of the brain shows advanced microvascular ischemic changes and multiple lacunar strokes consistent with vascular dementia. 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 Si Raider scheduled, this is very important discussed. However she is leaving, we will send referral elsewhere discussed with our staff today. - Son has POA and HCPOA and is involved with father's daily medication and finances - Will have them back after Formal Neurocog testing complete - He could not tolerate Aricept. On Namenda at this time, can continue but limited data in Vascular Dementia - Discussed causes of advanced chronic small vessel ischemia, needs close management of vascular risk factors with pcp - at next appointment can provide info on [facility], PACE program, provided online resources and books to read - fall risk, discussed fall precautions, use walking aid at all time   Orders Placed This Encounter  Procedures  . Ambulatory referral to Neuropsychology     A total of 30 minutes was spent face-to-face with this patient and son. Over half this time  was spent on counseling patient on the  1. Vascular dementia without behavioral disturbance (HCC)    diagnosis and different diagnostic and therapeutic options, counseling and coordination of care, risks ans benefits of management, compliance, or risk factor reduction and education.        Sarina Ill, MD  [facility] [address] [place], [place] [address]  Phone [phone] Fax [phone]

## 2018-07-04 ENCOUNTER — Other Ambulatory Visit: Payer: PPO

## 2018-07-04 ENCOUNTER — Telehealth: Payer: Self-pay | Admitting: Neurology

## 2018-07-04 NOTE — Telephone Encounter (Signed)
I have called and spoke to patient's son and relayed referral has been sent as urgent . To Dr. Sima Matas. [phone].

## 2018-07-06 ENCOUNTER — Other Ambulatory Visit: Payer: Self-pay | Admitting: *Deleted

## 2018-07-06 ENCOUNTER — Encounter: Payer: Self-pay | Admitting: Psychology

## 2018-07-06 NOTE — Patient Outreach (Signed)
[place] [facility]) Care Management  07/06/2018  John Blackburn 22-Jan-1946 [id number]  CSW spoke with pt's son by phone who reports he has completed Medicaid application. CSW updated son that [facility] form has been completed by PCP. Son is encouraged to review the resources mailed for facility options and determine which facilities he may want to pursue.   CSW will contact DSS/Medicaid for further info on status and options.     Eduard Clos, MSW, [place] Worker  [place] [phone]

## 2018-07-09 ENCOUNTER — Other Ambulatory Visit: Payer: Self-pay | Admitting: Cardiovascular Disease

## 2018-07-09 ENCOUNTER — Other Ambulatory Visit: Payer: Self-pay | Admitting: Cardiology

## 2018-07-09 NOTE — Telephone Encounter (Signed)
Patient needs to set up OV with Dr. Gwenlyn Found for further refills

## 2018-07-09 NOTE — Telephone Encounter (Signed)
Plavix sent into pharmacy # 30 with no refills. Needs OV for further refills

## 2018-07-12 ENCOUNTER — Other Ambulatory Visit: Payer: Self-pay | Admitting: *Deleted

## 2018-07-13 NOTE — Patient Outreach (Signed)
[place] [facility]) Care Management  07/13/2018  John Blackburn 1946-08-18 [id number]  CSW spoke with pt's family member, [name], who reports he has followed up at [facility] regarding the Medicaid application. "We now are in the 45 day window" and await determination. [name] is thinking he will pursue placement after the holidays/new year and has also connected with an agency who can assist him with "narrowing down the facility options".  Family requests that CSW follow up in a few weeks for updates and further needs/support.   CSW will plan a f/u call in 2-3 weeks.   Eduard Clos, MSW, [place] Worker  [place] [phone]

## 2018-07-19 ENCOUNTER — Other Ambulatory Visit: Payer: Self-pay | Admitting: *Deleted

## 2018-07-19 NOTE — Patient Outreach (Signed)
[facility]) Care Management  07/19/2018  John Blackburn 1945-09-28 [id number]   RN Health Coach Initial Assessment  Referral Date:  06/22/2018 Referral Source:  Transfer from [place] Reason for Referral:  Continued Disease Management Education Insurance:  Health Team Advantage   Outreach Attempt:   Outreach attempt #1 to patient for initial telephone assessment. No answer. RN Health Coach left HIPAA compliant voicemail message along with contact information.  Plan:  RN Health Coach will attempt another outreach within the month of November.   [place] [phone] [email or web address]

## 2018-07-20 NOTE — Patient Outreach (Signed)
[facility]) Care Management  07/20/2018  John Blackburn [birth year] [id number]   RN Health Coach Initial Assessment  Referral Date:  06/22/2018 Referral Source:  Transfer from [place] Reason for Referral:  Continued Disease Management Education Insurance:  Health Team Advantage   Outreach Attempt:  Received call back from patient's son.  HIPAA verified with son, Release of Information on file.  RN Health Coach introduced self and role.  Son, John Blackburn verifies they are looking into permanent nursing home placement of patient right after the Holidays.  Declines completing the initial telephone assessment at this time.  Request a call back after the Holidays to finalize plan versus possible continuing with Disease Management outreaches.  Plan:  RN Health Coach will make outreach attempt in the month of January per son's request.  Hubert [place] RN [place] [phone] [email or web address]

## 2018-07-28 DIAGNOSIS — G4733 Obstructive sleep apnea (adult) (pediatric): Secondary | ICD-10-CM | POA: Diagnosis not present

## 2018-07-31 ENCOUNTER — Other Ambulatory Visit: Payer: Self-pay | Admitting: Cardiovascular Disease

## 2018-07-31 ENCOUNTER — Other Ambulatory Visit: Payer: Self-pay | Admitting: Physician Assistant

## 2018-07-31 NOTE — Telephone Encounter (Signed)
Rx request sent to pharmacy.  

## 2018-07-31 NOTE — Telephone Encounter (Signed)
Rx(s) sent to pharmacy electronically.  

## 2018-08-03 ENCOUNTER — Other Ambulatory Visit: Payer: Self-pay | Admitting: *Deleted

## 2018-08-03 ENCOUNTER — Ambulatory Visit: Payer: Self-pay | Admitting: *Deleted

## 2018-08-03 NOTE — Patient Outreach (Signed)
[facility] [facility]) Care Management  08/03/2018  John Blackburn 28-Apr-1946 [id number]   CSW attempted to reach pt's son by phone today and was unsuccessful. CSW did leave a voice message for return call and will try again in 3-4 business days if no return call is received.     Eduard Clos, MSW, [place] Worker  [place] [phone]

## 2018-08-07 ENCOUNTER — Other Ambulatory Visit: Payer: Self-pay | Admitting: *Deleted

## 2018-08-16 DIAGNOSIS — H2513 Age-related nuclear cataract, bilateral: Secondary | ICD-10-CM | POA: Diagnosis not present

## 2018-08-16 DIAGNOSIS — H04123 Dry eye syndrome of bilateral lacrimal glands: Secondary | ICD-10-CM | POA: Diagnosis not present

## 2018-08-20 ENCOUNTER — Encounter: Payer: PPO | Admitting: Psychology

## 2018-08-27 DIAGNOSIS — G4733 Obstructive sleep apnea (adult) (pediatric): Secondary | ICD-10-CM | POA: Diagnosis not present

## 2018-09-08 ENCOUNTER — Other Ambulatory Visit: Payer: Self-pay | Admitting: Cardiovascular Disease

## 2018-09-08 ENCOUNTER — Other Ambulatory Visit: Payer: Self-pay | Admitting: Internal Medicine

## 2018-09-12 ENCOUNTER — Other Ambulatory Visit: Payer: Self-pay | Admitting: *Deleted

## 2018-09-12 ENCOUNTER — Encounter: Payer: Self-pay | Admitting: *Deleted

## 2018-09-12 ENCOUNTER — Ambulatory Visit: Payer: Self-pay | Admitting: *Deleted

## 2018-09-12 NOTE — Patient Outreach (Signed)
[facility]) Care Management  09/12/2018  LOFTON LEON 1945-11-10 [id number]   CSW spoke with pt briefly today- likley confused/disortiented,  by phone. " I am getting better and need to get back to work soon". Pt's son not available by phone for updates and CSW left voice message for son to return call.   CSW will plan to outreach son again in 3-4 business days if no return call received.   Eduard Clos, MSW, [place] Worker  [place] [phone]

## 2018-09-17 ENCOUNTER — Other Ambulatory Visit: Payer: Self-pay | Admitting: *Deleted

## 2018-09-17 NOTE — Patient Outreach (Signed)
[place] [facility]) Care Management  09/17/2018  John Blackburn [birth year] [id number]   CSW spoke with John Blackburn, family/caregiver,  states pt is stronger and getting around well in the apartment. He has inquired with DSS about Medicaid and determined his income is too high for ALF so would need to consider an ALF Memory Care unit (when appropriate). CSW to ask Daneen Schick, BSW, CDP, to mail [place] care unit options that accept Medicaid. John Blackburn anticipates making a move in the next few months and asked that I touch base in March for updates. Encouraged him to call if needs arise before then,        Eduard Clos, MSW, [place] Worker  [place] [phone]

## 2018-09-20 ENCOUNTER — Other Ambulatory Visit: Payer: Self-pay | Admitting: *Deleted

## 2018-09-20 NOTE — Patient Outreach (Signed)
[place] [facility]) Care Management  09/20/2018  GERLAD PELZEL 16-Oct-1945 [id number]   [place] Initial Assessment  Referral Date:06/22/2018 Referral Source:Transfer from [facility] Nurse Reason for Referral:Continued Disease Management Education Insurance:Health Team Advantage   Outreach Attempt:  Outreach attempt #2 to patient's son for verification of continuing with [facility] Disease Management services and initial telephone assessment.  On first call placed to son, telephone line hung up after requesting to speak with "Mr. Donny Pique".  Attempted another telephone outreach to son, no answer.  RN Health Coach left HIPAA compliant voicemail message with return contact information.  Plan:  RN Health Coach will attempt another telephone outreach to son within the month of February, if no return call back from son.  [place] [phone] [email or web address]

## 2018-09-27 DIAGNOSIS — G4733 Obstructive sleep apnea (adult) (pediatric): Secondary | ICD-10-CM | POA: Diagnosis not present

## 2018-10-01 ENCOUNTER — Other Ambulatory Visit: Payer: Self-pay | Admitting: Cardiovascular Disease

## 2018-10-01 ENCOUNTER — Encounter: Payer: PPO | Attending: Psychology | Admitting: Psychology

## 2018-10-01 DIAGNOSIS — R413 Other amnesia: Secondary | ICD-10-CM | POA: Diagnosis not present

## 2018-10-01 DIAGNOSIS — F0150 Vascular dementia without behavioral disturbance: Secondary | ICD-10-CM | POA: Insufficient documentation

## 2018-10-02 ENCOUNTER — Other Ambulatory Visit: Payer: Self-pay | Admitting: Nurse Practitioner

## 2018-10-02 NOTE — Telephone Encounter (Signed)
New  Message    *STAT* If patient is at the pharmacy, call can be transferred to refill team.   1. Which medications need to be refilled? (please list name of each medication and dose if known) clopidogrel (PLAVIX) 75 MG tablet   pantoprazole (PROTONIX) 40 MG tablet  2. Which pharmacy/location (including street and city if local pharmacy) is medication to be sent to?[place], [place]  3. Do they need a 30 day or 90 day supply? [place]

## 2018-10-03 MED ORDER — CLOPIDOGREL BISULFATE 75 MG PO TABS: 75.0000 mg | ORAL_TABLET | Freq: Every day | ORAL | 0 refills | Status: DC

## 2018-10-03 MED ORDER — PANTOPRAZOLE SODIUM 40 MG PO TBEC: 40.0000 mg | DELAYED_RELEASE_TABLET | Freq: Every day | ORAL | 0 refills | Status: DC

## 2018-10-08 ENCOUNTER — Encounter: Payer: PPO | Admitting: Psychology

## 2018-10-10 ENCOUNTER — Other Ambulatory Visit: Payer: Self-pay | Admitting: *Deleted

## 2018-10-10 NOTE — Patient Outreach (Signed)
[place] [facility]) Care Management  10/10/2018  CHANNON AMBROSINI [birth year] [id number]   [place] Initial Assessment  Referral Date:06/22/2018 Referral Source:Transfer from [facility] Nurse Reason for Referral:Continued Disease Management Education Insurance:Health Team Advantage   Outreach Attempt:  Outreach attempt #3 to patient's son for verification of continued interest in [place] [facility] Disease Management program and initial telephone assessment . No answer. RN Health Coach left HIPAA compliant voicemail message along with contact information.  Plan:  RN Health Coach will make another outreach attempt within the month of March.  RN Health Coach will send message to [place] Worker, Marcie Bal to request that she verifies interest in services at her next outreach to son.  [place] [phone] [email or web address]

## 2018-10-11 ENCOUNTER — Encounter: Payer: Self-pay | Admitting: Psychology

## 2018-10-11 NOTE — Progress Notes (Signed)
Neuropsychological Consultation   Patient:   John Blackburn   DOB:   Jan 09, 1946  MR Number:  [id number]  Location:  [facility] [place], [place] [id number] [place] [address] Dept: [phone]           Date of Service:   10/01/2018  Start Time:   3 PM End Time:   4 PM  Provider/Observer:  Ilean Skill, Psy.D.       Clinical Neuropsychologist       Billing Code/Service: Neurobehavioral diagnostic evaluation  Chief Complaint:    John Blackburn is a 73 year old male referred by Dr. Jaynee Eagles with [facility] for neuropsychological evaluation due to progressive short-term memory changes, geographic disorientation and difficulty driving and increasing difficulties with ADLs.  The purpose of the evaluation was to contribute to diagnostic considerations including indications of cerebrovascular issues related to small vessel disease and history of multiple lacunar stroke, obtained at objective assessment of current level of cognitive impairments and provide information to be helpful for the [facility] 2 forms for memory care placement.  Reason for Service:  John Blackburn is a 73 year old male referred by Dr. Jaynee Eagles with [facility] for neuropsychological evaluation due to progressive short-term memory changes, geographic disorientation and difficulty driving and increasing difficulties with ADLs.  The purpose of the evaluation was to contribute to diagnostic considerations including indications of cerebrovascular issues related to small vessel disease and history of multiple lacunar stroke, obtained at objective assessment of current level of cognitive impairments and provide information to be helpful for the [facility] 2 forms for memory care placement.  The patient has had an MRI conducted on 07/03/2018 that showed advanced microvascular ischemic changes and multiple lacunar  strokes.  The patient was accompanied for the clinical interview today along with his son.  The patient's son reports that the patient got lost driving to a family reunion that was in [place] when he was trying to drive from [place].  The patient essentially took 15 and half hours driving in different directions along the interstate and ultimately ended up back in [place].  The patient had a heart attack in 2018 and also had an episode in August where he was not eating and became dehydrated and had several falls.  There was a time where the patient was in a skilled nursing facility for a few weeks but is now receiving in-home health care.  The patient is having trouble keeping up and being able to feed himself and his aides help with food.  There are changes in motor function including shuffling when he walks and tongue movements similar to tardive dyskinesia types of movements.  There also significant problem solving and other executive functioning deficits.  The patient has a history of memory loss as well as a history of obstructive sleep apnea, hoarding behaviors, MI, hypertension, high cholesterol, and depression.  The patient had an acute worsening in September where he got sick and he was having difficulty maintaining his thought process and answering questions.  The patient does have a prior history of depression.  He is not currently driving.  The patient's son helps with finances and started helping 5 or 6 months ago.  This was due to things not getting paid and bills stacking up.  The patient does not cook or shop or clean.  He has had a hoarding problem for many years.  Current Status:  The  patient has been having increasing memory problems as well as executive functioning deficits and has had time in a skilled nursing facility and is currently being aided with in-home health care taking care of ADLs which the patient is not able to take care of himself.  Reliability of  Information: The information is derived from 1 hour face-to-face clinical interview with the patient as well as review of available medical records.  Behavioral Observation: John Blackburn  presents as a 73 y.o.-year-old Right Caucasian Male who appeared his stated age. his dress was Appropriate and he was Well Groomed and his manners were Appropriate to the situation.  his participation was indicative of Appropriate and Inattentive behaviors.  There were any physical disabilities noted.  he displayed an appropriate level of cooperation and motivation.     Interactions:    Active Inattentive and Redirectable  Attention:   abnormal and attention span appeared shorter than expected for age  Memory:   abnormal; global memory impairment noted  Visuo-spatial:  not examined  Speech (Volume):  low  Speech:   normal; slowed verbal response patterns  Thought Process:  Coherent and Circumstantial  Though Content:  WNL; not suicidal and not homicidal  Orientation:   person  Judgment:   Poor  Planning:   Poor  Affect:    Lethargic  Mood:    Dysphoric  Insight:   Lacking  Intelligence:   high  Marital Status/Living: The patient was born and raised in [place] and had 1 sibling.  The patient currently lives alone but has extensive in-home health care helping with ADLs, nutrition, cleaning, and general functioning.  Finances are taken care of by his son.  The patient is divorced and married in 1971 and was married for 35 years.  The patient has a 70 year old son, 74 year old son, and a 11 year old son the patient's youngest son suffered significant head trauma at 45 years of age.  Current Employment: The patient is not working.  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  Substance Use:  No concerns of  substance abuse are reported.    Education:   The patient graduated from [employer] with a 3.0 GPA average.  Medical History:   Past Medical History:  Diagnosis Date  . Allergy   . 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 CAD (coronary artery disease)    08/2017 PCI/DES mRCA, RPDA, CTO pf pLCX with collaterals, normal EF  . Depression    on multiple meds. Has been seen at [facility]. and Dr. Sabra Heck is his prescriber  . Diverticulitis of colon 2000 's   treated as an outpatient  . GERD (gastroesophageal reflux disease)    UGI done August '12 - ulcer/. Dr. Ferdinand Lango, gastroenterologist in [facility].   [name] Hx of adenomatous colonic polyps   . Hyperlipidemia    last lipid panel: HDL 44, LDL 117  . Hypertension   . Myocardial infarction ([place])   . Peptic ulcer    in the past and just recently-August '12  . Pulmonary emboli [facility])    noted October 2014 - treated by Dr. Linda Hedges  . skin cancer    skin CA  . Sleep apnea, primary central    wears CPAP        Psychiatric History:  The patient does have a prior history of significant depression.  Family Med/Psych History:  Family History  Problem Relation Age of Onset  . Stroke Mother   . Hypertension Mother   . Heart disease Mother   . Heart disease Father        CAD/MI-fatal sudden death  . Colon cancer Father   . Colon cancer Sister        colon cancer 20090-survivor  . Heart disease Paternal Uncle   . Colon cancer Paternal Uncle   . Heart disease Paternal Grandfather   . Colon cancer Paternal Aunt   . Colon cancer Cousin   . Colon cancer Cousin   . Colon cancer Paternal Uncle   . Stomach cancer Neg Hx   . Rectal cancer Neg Hx   . Esophageal cancer Neg Hx     Risk of Suicide/Violence: low the patient denies any suicidal or homicidal ideation.  Impression/DX:  John Blackburn is a 73 year old male referred by Dr. Jaynee Eagles with [facility] for neuropsychological evaluation due to  progressive short-term memory changes, geographic disorientation and difficulty driving and increasing difficulties with ADLs.  The purpose of the evaluation was to contribute to diagnostic considerations including indications of cerebrovascular issues related to small vessel disease and history of multiple lacunar stroke, obtained at objective assessment of current level of cognitive impairments and provide information to be helpful for the [facility] 2 forms for memory care placement.  The patient has had an MRI conducted on 07/03/2018 that showed advanced microvascular ischemic changes and multiple lacunar strokes.  The patient was accompanied for the clinical interview today along with his son.  The patient's son reports that the patient got lost driving to a family reunion that was in [place] when he was trying to drive from [place].  The patient essentially took 71 and half hours driving in different directions along the interstate and ultimately ended up back in [place].  The patient had a heart attack in 2018 and also had an episode in August where he was not eating and became dehydrated and had several falls.  There was a time where the patient was in a skilled nursing facility for a few weeks but is now receiving in-home health care.  The patient is having trouble keeping up and being able to feed himself and his aides help with food.  There are changes in motor function including shuffling when he walks and tongue movements similar to tardive dyskinesia types of movements.  There also significant problem solving and other executive functioning deficits.  The patient has a history of memory loss as well as a history of obstructive sleep apnea, hoarding behaviors, MI, hypertension, high cholesterol, and depression.  The patient had an acute worsening in September where he got sick and he was having difficulty maintaining his thought process and answering questions.  The patient does have  a prior history of depression.  He is not currently driving.  The patient's son helps with finances and started helping 5 or 6 months ago.  This was due to things not getting paid and bills stacking up.  The patient does not cook or shop or clean.  He has had a hoarding problem for many years.   Disposition/Plan:  We have set the patient up for formal neuropsychological evaluation primarily utilizing the RBANS A neuropsychological test battery.  Once this is completed and will be determination of other formal testing is needed to answer referral questions provide accurate feedback  to the patient and his family.  Diagnosis:    Memory loss due to medical condition  Vascular dementia without behavioral disturbance ([place])         Electronically Signed   _______________________ Ilean Skill, Psy.D.

## 2018-10-19 ENCOUNTER — Encounter: Payer: PPO | Attending: Psychology | Admitting: Psychology

## 2018-10-19 ENCOUNTER — Encounter: Payer: Self-pay | Admitting: Psychology

## 2018-10-19 DIAGNOSIS — R413 Other amnesia: Secondary | ICD-10-CM | POA: Insufficient documentation

## 2018-10-19 DIAGNOSIS — F0150 Vascular dementia without behavioral disturbance: Secondary | ICD-10-CM | POA: Diagnosis not present

## 2018-10-19 NOTE — Progress Notes (Signed)
BEHAVIOR OBSERVATIONS: Patient was on time to his 3:00pm testing appointment and was accompanied by his son. He was administered the Repeatable Battery for the Assessment of Neuropsychological Status (RBANS, Form B). The evaluation lasted around 60 minutes. His participation was indicative of appropriate and redirectable behaviors.  He was appropriately dressed and adequately groomed. He displayed an appropriate level of cooperation and motivation. Mood was euthymic overall. Affect was appropriate and congruent with mood. Next testing session scheduled for 10/22/2018 at 3:00pm.   Results of the RBANS (Form B) are as follows:    Measure  Standard Score/ Scaled Score Percentile Description  Immediate Memory 44 <1 Impaired  List Learning 2 <1 Impaired  Story Memory 1 <1 Impaired  Visuospatial/Constructional 64 1 Impaired  Figure Copy 7 16 Low Average  Line Orientation - ?2 Impaired  Language 85 16 Low Average  Picture Naming - 26-50 Average  Semantic Fluency 4 2 Borderline  Attention 49 <1 Impaired  Digit Span 4 2 Borderline  Coding 1 <1 Impaired  Delayed Memory 40 <1 Impaired  List Recall - ?2 Impaired  List Recognition - ?2 Impaired  Story Recall 1 <1 Impaired  Figure Recall 3 1 Impaired  Total  50 <1 Impaired

## 2018-10-22 ENCOUNTER — Encounter: Payer: PPO | Admitting: Psychology

## 2018-10-22 ENCOUNTER — Encounter: Payer: Self-pay | Admitting: Psychology

## 2018-10-22 DIAGNOSIS — R413 Other amnesia: Secondary | ICD-10-CM | POA: Diagnosis not present

## 2018-10-22 DIAGNOSIS — F0150 Vascular dementia without behavioral disturbance: Secondary | ICD-10-CM

## 2018-10-22 NOTE — Progress Notes (Signed)
BEHAVIOR OBSERVATIONS: Patient was on time to his 3:00pm testing appointment.  He was administered the Wechsler Memory Scale, 4th Edition, Older Adult Battery, which lasted around 60 minutes. He was oriented to person and place but not time or date (e.g. stated the current year as 2010, month as December, date as the 61rd of the month, and time of the evaluation as 10:00am). He was appropriately dressed and adequately groomed. He was cooperative and gave good effort. He continued to have difficulty understanding test instructions and required additional prompting and questionings to be repeated multiple times. Mood was euthymic overall. Affect was appropriate and congruent with mood.   Results of the WMS-IV (Older Adult Battery) are as follows:    Brief Cognitive Status Exam Classification  Age Years of Education Raw Score Classification Level Base Rate  72 years 7 months 16 10 Very Low 0.0   Index Score Summary  Index Sum of Scaled Scores Index Score Percentile Rank 95% Confidence Interval Qualitative Descriptor  Auditory Memory (AMI) 9 51 0.1 47-60 Extremely Low  Visual Memory (VMI) 2 40 <0.1 37-47 Extremely Low  Immediate Memory (IMI) 6 49 <0.1 45-58 Extremely Low  Delayed Memory (DMI) 5 46 <0.1 43-58 Extremely Low   Primary Subtest Scaled Score Summary  Subtest Domain Raw Score Scaled Score Percentile Rank  Logical Memory I AM 5 1 0.1  Logical Memory II AM 0 1 0.1  Verbal Paired Associates I AM 5 4 2   Verbal Paired Associates II AM 1 3 1   Visual Reproduction I VM 0 1 0.1  Visual Reproduction II VM 0 1 0.1  Symbol Span VWM 2 2 0.4   Auditory Memory Process Score Summary  Process Score Raw Score Scaled Score Percentile Rank Cumulative Percentage (Base Rate)  LM II Recognition 17 - - 26-50%  VPA II Recognition 16 - - <=2%   Visual Memory Process Score Summary  Process Score Raw Score Scaled Score Percentile Rank Cumulative Percentage (Base Rate)  VR II Recognition 0 - - <=2%    VR II Copy 5 - - <=2%

## 2018-10-23 ENCOUNTER — Ambulatory Visit: Payer: PPO

## 2018-10-30 ENCOUNTER — Telehealth: Payer: Self-pay | Admitting: Physician Assistant

## 2018-10-30 MED ORDER — PANTOPRAZOLE SODIUM 40 MG PO TBEC: 40.0000 mg | DELAYED_RELEASE_TABLET | Freq: Every day | ORAL | 0 refills | Status: DC

## 2018-10-30 MED ORDER — CLOPIDOGREL BISULFATE 75 MG PO TABS: 75.0000 mg | ORAL_TABLET | Freq: Every day | ORAL | 0 refills | Status: DC

## 2018-10-30 NOTE — Telephone Encounter (Signed)
°*  STAT* If patient is at the pharmacy, call can be transferred to refill team.   1. Which medications need to be refilled? (please list name of each medication and dose if known)  pantoprazole (PROTONIX) 40 MG tablet  clopidogrel (PLAVIX) 75 MG tablet     2. Which pharmacy/location (including street and city if local pharmacy) is medication to be sent to? [place]  3. Do they need a 30 day or 90 day supply? 90   PT has an appt with PA on 12/12/18. Pt will be out of meds before appt, and son says a 90 day refill will get the pt to his appt

## 2018-10-30 NOTE — Telephone Encounter (Signed)
Protonix and Plavix RX sent in to the pharmacy

## 2018-11-01 ENCOUNTER — Other Ambulatory Visit: Payer: Self-pay | Admitting: Internal Medicine

## 2018-11-01 ENCOUNTER — Other Ambulatory Visit: Payer: Self-pay | Admitting: Cardiology

## 2018-11-06 ENCOUNTER — Other Ambulatory Visit: Payer: Self-pay | Admitting: *Deleted

## 2018-11-06 NOTE — Patient Outreach (Signed)
[facility]) Care Management  11/06/2018  TANUJ MULLENS 03-Dec-1945 [id number]   CSW made contact with pt's son who states they are still considering long term options for pt. He is aware of the need for FL2 from PCP once they are closer to being ready for placement. Pt's son is also aware of the payment piece for ALF and has met with DSS worker and knows the steps he needs to take to "spend down"  prior to completing the application.  Pt's has the ALF lists provided and they are planning to tour and make some selections for top choices.   Per son, his dad is planning to be seen by a Neuro Psychologist and some testing is still to be completed.   Pt's son request a follow up call in 4-6 weeks and is encouraged to call CSW if needs arise before that time.    Eduard Clos, MSW, LCSW Clinical Social Worker  [place], MSW, [place] Worker  [place] [phone]

## 2018-11-09 ENCOUNTER — Encounter: Payer: PPO | Attending: Psychology | Admitting: Psychology

## 2018-11-09 ENCOUNTER — Encounter: Payer: Self-pay | Admitting: Psychology

## 2018-11-09 DIAGNOSIS — Z86711 Personal history of pulmonary embolism: Secondary | ICD-10-CM | POA: Diagnosis not present

## 2018-11-09 DIAGNOSIS — R413 Other amnesia: Secondary | ICD-10-CM | POA: Insufficient documentation

## 2018-11-09 DIAGNOSIS — I2510 Atherosclerotic heart disease of native coronary artery without angina pectoris: Secondary | ICD-10-CM | POA: Insufficient documentation

## 2018-11-09 DIAGNOSIS — Z79899 Other long term (current) drug therapy: Secondary | ICD-10-CM | POA: Insufficient documentation

## 2018-11-09 DIAGNOSIS — I252 Old myocardial infarction: Secondary | ICD-10-CM | POA: Diagnosis not present

## 2018-11-09 DIAGNOSIS — Z86010 Personal history of colonic polyps: Secondary | ICD-10-CM | POA: Diagnosis not present

## 2018-11-09 DIAGNOSIS — G4731 Primary central sleep apnea: Secondary | ICD-10-CM | POA: Insufficient documentation

## 2018-11-09 DIAGNOSIS — I10 Essential (primary) hypertension: Secondary | ICD-10-CM | POA: Insufficient documentation

## 2018-11-09 DIAGNOSIS — F0150 Vascular dementia without behavioral disturbance: Secondary | ICD-10-CM | POA: Insufficient documentation

## 2018-11-09 DIAGNOSIS — K219 Gastro-esophageal reflux disease without esophagitis: Secondary | ICD-10-CM | POA: Insufficient documentation

## 2018-11-09 DIAGNOSIS — Z7982 Long term (current) use of aspirin: Secondary | ICD-10-CM | POA: Insufficient documentation

## 2018-11-09 DIAGNOSIS — E785 Hyperlipidemia, unspecified: Secondary | ICD-10-CM | POA: Insufficient documentation

## 2018-11-09 NOTE — Progress Notes (Signed)
Neuropsychological Evaluation   Patient:  John Blackburn   DOB: 1946/08/07  MR Number: [id number]  Location: [facility] [place], [place] [id number] [place] [id number] Dept: [phone]  Start: 8 AM 11/09/2018 End: 9 AM   11/09/2018  Provider/Observer:     Edgardo Roys PsyD  Chief Complaint:      Chief Complaint  Patient presents with  . Memory Loss  . Sleeping Problem  . Other    Geographic disorientation    Reason For Service:     John Blackburn is a 73 year old male referred by Dr. Jaynee Eagles with [facility] for neuropsychological evaluation due to progressive short-term memory changes, geographic disorientation and difficulty driving and increasing difficulties with ADLs.  The purpose of the evaluation was to contribute to diagnostic considerations including indications of cerebrovascular issues related to small vessel disease and history of multiple lacunar stroke, obtain an objective assessment of current level of cognitive impairments and provide information to be helpful for the [facility] forms for memory care placement.  The patient has had an MRI conducted on 07/03/2018 that showed advanced microvascular ischemic changes and multiple lacunar strokes.  The patient was accompanied for the clinical interview today along with his son.  The patient's son reports that the patient got lost driving to a family reunion that was in [place] when he was trying to drive from [place].  The patient essentially took 1 and half hours driving [place] different [place] and ultimately ended up back in [place].  The patient had a heart attack in 2018 and also had an episode in August where he was not eating and became dehydrated and had several falls.  There was a time where the patient was in a skilled nursing facility for a few weeks  but is now receiving in-home health care.  The patient is having trouble keeping up and being able to feed himself and his aides help with food preperation.  There are changes in motor function including shuffling when he walks and tongue movements similar to tardive dyskinesia types of movements.  There also significant problem solving and other executive functioning deficits.  The patient has a history of memory loss as well as a history of obstructive sleep apnea, hoarding behaviors, MI, hypertension, high cholesterol, and depression.  The patient had an acute worsening in September where he got sick and he was having difficulty maintaining his thought process and answering questions.  The patient does have a prior history of depression.  He is not currently driving.  The patient's son helps with finances and started helping 5 or 6 months ago.  This was due to things not getting paid and bills stacking up.  The patient does not cook or shop or clean.  He has had a hoarding problem for many years.  Testing Administered:  The patient was administered the RBANS B neuropsychological test battery as well as the [place] for older adults.  Participation Level:   Active  Participation Quality:  Appropriate and Redirectable      Behavioral Observation:  Well Groomed, Alert, and Appropriate.   I will include the behavioral observations made by Dr. Darol Destine during both the 2/14 and 2/16 test administration sessions.  BEHAVIOR OBSERVATIONS: Patient was on time to his 3:00pm testing appointment and was accompanied by his son. He was administered the Repeatable Battery for the Assessment of  Neuropsychological Status (RBANS, Form B). The evaluation lasted around 60 minutes. Hisparticipation was indicative of appropriate and redirectablebehaviors. He was appropriately dressed and adequately groomed. Hedisplayed an appropriatelevel of cooperation and motivation. Mood was euthymic overall. Affect  was appropriate and congruent with mood. Next testing session scheduled for 10/22/2018 at 3:00pm.  BEHAVIOR OBSERVATIONS: Patient was on time to his 3:00pm testing appointment.  He was administered the Wechsler Memory Scale, 4th Edition, Older Adult Battery, which lasted around 60 minutes. He was oriented to person and place but not time or date (e.g. stated the current year as 2010, month as December, date as the 72rd of the month, and time of the evaluation as 10:00am). He was appropriately dressed and adequately groomed. He was cooperative and gave good effort. He continued to have difficulty understanding test instructions and required additional prompting and questionings to be repeated multiple times. Mood was euthymic overall. Affect was appropriate and congruent with mood.   Test Results:   Initially, the patient completed the repeatable battery for neuropsychological status form B.  This administration was conducted on 10/19/2018.     RBANS Update Form B Total Scale 50  The patient produced a global/total scale index score of 50 which falls below the 1st percentile and performance in relationship to his normative comparison group where he is matched for age and education.  This performance suggests global cognitive deficits across multiple domains and is indicative of significant cognitive and memory deficits.     RBANS Update Form B Attention 49 Digit Span4 Coding1  The patient produced an attention index score of 49 which falls below the 1st percentile of functioning relative to his normative comparison group.  Individual measures making up this index were impaired for both auditory encoding abilities as well as information processing speed measures that included visual scanning and visual searching components.  This is a significantly impaired score with regard to attention and encoding abilities.     RBANS Update Form B Visuospatial/ Constructional 64 Figure Copy7 Line  Orientation<=2  The patient produced a visual spatial/visual construction index score of 64 which falls in the severely impaired range and at the 1st percentile of functioning relative to his normative comparison group.  Individual subtest making up this measure shows significant deficits with regard to line orientation and visual-spatial abilities as well as constructional abilities.  The patient's constructional abilities were better than his visual-spatial abilities on this measure but were still in the mild impairment range.      RBANS Update Form B Language 85 Picture Naming26-50 Semantic Fluency4  The patient produced a language index score of 85 which falls at the 16th percentile.  This was his best performing area of functioning with the patient doing generally well with regard to targeted naming but had some greater difficulties with regard to somatic fluency measures.  The patient showed problems with verbal fluency and free recall of words whereas he did much better when he was cued and had targeted naming components.      RBANS Update Form B Immediate Memory 44 List [place]  The patient produced an immediate memory index score of 44 which falls below the 1st percentile and is indicative of significant and severely impaired immediate memory and learning capacity.  Individual subtest making up this measure included both list learning over 4 learning trials as well as story memory learning.  Both of these subtest were severely and significantly impaired.      RBANS Update Form B Delayed  Memory 40 List Recall<=2 List Recognition<=2 Story Recall1 Figure Recall3  Finally, the patient was placed in delayed memory task and produced a delayed memory index score of 40 which was his most severely and significantly impaired score and falls well below the 1st percentile of functioning relative to his normative comparison group.  The patient had significant severe deficits with  regard to recall of the initially presented list information and list words as well as significant deficits with regard to recognition when placed in a cueing format for this list.  The patient's story recall and figure recall were both significantly and severely impaired.  Overall, the patient's global performance on this objective assessment of a wide range of neuropsychological functioning showed global deficits across almost all domains assessed.  The patient's did have low average to mildly impaired scores with regard to his language assessment and the patient did much better with regard to targeted naming of common objects but had moderate to significant deficits with regard to verbal fluency measures.   The patient was then administered the [place] for older adults in order to get a more broad assessment of memory and learning capacities.  Below are the global index scores for this measure.   Brief Cognitive Status Exam Classification  Age Years of Education Raw Score Classification Level Base Rate  72 years 7 months 12 10 Very Low 0.0    Index Score Summary  Index Sum of Scaled Scores Index Score Percentile Rank 95% Confidence Interval Qualitative Descriptor  Auditory Memory (AMI) 9 51 0.1 47-60 Extremely Low  Visual Memory (VMI) 2 40 <0.1 37-47 Extremely Low  Immediate Memory (IMI) 6 49 <0.1 45-58 Extremely Low  Delayed Memory (DMI) 5 46 <0.1 43-58 Extremely Low   Initially, the patient performed in the very low range of functioning with regard to his overall mental status/brief cognitive status exam.  This is indicative of global cognitive deficits noted during the RBANS.  The patient produced an immediate memory index score of 49 which falls below the 0.1 percentile for functioning and is in the extremely low/severely impaired range for immediate memory.  Breaking up his visual and auditory memory in 2 separate assessment domains the patient produced an auditory  memory index score of 51 which falls at the point 1 percentile and is in the extremely low range/severely impaired range relative to his normative comparison group.  This pattern was also seen with even greater deficits with regard to visual memory as he produced an index score of 40 falling below the 0.1 percentile and in the extremely low/severely impaired range.  Finally, the patient was assessed these earlier memory challenges in a delayed memory format.  The patient produced a delayed memory index score of 46 which falls below the 0.1 percentile range and was also in the extremely low/severely impaired range.   Summary of Results:   Overall, the current objective assessment over a broad range of neuropsychological/cognitive areas, show that the patient performed in the extremely low/significantly impaired range with regard to his global neuropsychological performance as indicated by his total score index on the RBANS.  The patient's best performing area has to do with expressive language, where he performed at the 16th percentile globally.  The patient did much better with regard to targeted/cued naming recall where he was between the 25th to the 50th percentile of performance.  The patient had greater difficulties with regard to verbal fluency and free recall of words.  The patient showed significant  and profound deficits with regard to visual spatial/visual constructional abilities, primary attention and concentration measures including auditory and visual encoding abilities as well as overall information processing speed and focus execute abilities.  The patient also displayed significant and profound deficits with regard to immediate learning and memory as well as delayed learning and memory on both the RBANS as well as the [place].  These memory and learning deficits were seen in all domains assessed including initial encoding abilities, visual memory and auditory memory.  The patient  also displayed significant profound deficits with regard to delayed memory functioning.  Impression/Diagnosis:   The results of the current objective neuropsychological evaluation are consistent with significant and global cognitive deficits.  The patient has had significant cerebrovascular changes/events including small vessel disease and history of multiple lacunar strokes as well as a history of myocardial infarction.  The patient showed significant geographical disorientation, significant loss in ADLs including preparing food and managing his medications as well as significant executive functioning deficits.  While the patient is able to effectively communicate and express his thoughts and feelings, he is no longer able to take care of himself independently given the level of memory, executive functioning, visual-spatial, and attentional deficits observed as indicated by both clinical information provided by the patient and his son and the objective neuropsychological assessment.  I do think that it is clearly time for the patient to be aided with skilled nursing placement as the patient's need for care is now outstripping the abilities for in-home health care.  The patient's diagnostic picture given objective neurological findings related to multiple lacunar strokes as well as significant small vessel disease would be the most likely explanation as far as etiological factors regarding his cognitive decline.  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  I will provide feedback to the patient and his family and coordinate care with his treating neurologist in any way that is needed.  Diagnosis:    Axis I: Vascular dementia without behavioral disturbance ([place])  Memory loss due to medical  condition   Ilean Skill, Psy.D. Neuropsychologist

## 2018-11-13 ENCOUNTER — Encounter: Payer: Self-pay | Admitting: Internal Medicine

## 2018-11-19 ENCOUNTER — Other Ambulatory Visit: Payer: Self-pay | Admitting: Internal Medicine

## 2018-11-20 ENCOUNTER — Other Ambulatory Visit: Payer: Self-pay | Admitting: *Deleted

## 2018-11-20 NOTE — Patient Outreach (Signed)
John Blackburn) Care Management  11/20/2018  John Blackburn 09/29/1945 [id number]   John Blackburn Initial Assessment  Referral Date:06/22/2018 Referral Source:Transfer from [facility] Nurse Reason for Referral:Continued Disease Management Education Insurance:[employer]   Outreach Attempt:  Outreach attempt #4 to patient's son for verification of continued interest in [facility] Disease Management program and initial telephone assessment.  Sounded like telephone line was picked up and hung up or went dead.  Unable to leave message.  Per chart review, looks as if son is looking for Memory Care Placement for patient and is speaking with [facility] Social Worker and primary care.  Plan:  RN Health Coach will attempt another outreach to patient's son within the month of April.  RN Health Coach will monitor chart for possible ALF/Memory Care Unit placement.  RN Health Coach will keep contact with [facility] Social Worker for updates.  [place] [place] [phone] [email or web address]

## 2018-12-05 NOTE — Progress Notes (Deleted)
{Choose 1 Note Type (Telehealth Visit or Telephone Visit):[phone]}  Evaluation Performed:  Follow-up visit  This visit type was conducted due to national recommendations for restrictions regarding the COVID-19 Pandemic (e.g. social distancing).  This format is felt to be most appropriate for this patient at this time.  All issues noted in this document were discussed and addressed.  No physical exam was performed (except for noted visual exam findings with Video Visits).  Please refer to the patient's chart (MyChart message for video visits and phone note for telephone visits) for the patient's consent to telehealth for [facility].  Date:  12/05/2018   ID:  John Blackburn, DOB 07-23-1946, MRN [id number]  Patient Location:  ***  Provider location:   ***  PCP:  Hoyt Koch, MD  Cardiologist:  Quay Burow, MD 08/30/2017 in-hospital Electrophysiologist:  None   Chief Complaint:  ***  History of Present Illness:    John Blackburn is a 73 y.o. male who presents via audio/video conferencing for a telehealth visit today.    73 yo male w/ hx HTN, HLD, post-op PE 2014, GERD, OA, OSA on CPAP, depression, diverticulitis, D-CHF, CAD s/p DES RCA and med rx for 100% CFX 08/2017, D-CHF dx 08/2017, vascular dementia w/ SNF placement planned. Rx for Protonix and Plavix called in 10/30/2018.   The patient {does/does not:200015} have symptoms concerning for COVID-19 infection (fever, chills, cough, or new shortness of breath).    Prior CV studies:   The following studies were reviewed today:  CAROTID DOPPLERS: 12/18/2017 Final Interpretation: Right Carotid: There was no evidence of thrombus, dissection, atherosclerotic                plaque or stenosis in the cervical carotid system.  Left Carotid: Velocities in the left ICA are consistent with a 1-39% stenosis.               The extracranial vessels were near-normal with only minimal wall               thickening  or plaque.  Vertebrals:  Bilateral vertebral arteries demonstrate antegrade flow. Subclavians: Normal flow hemodynamics were seen in bilateral subclavian              arteries.  CARDIAC CATH: 09/01/2017  Mid RCA lesion is 80% stenosed.  A drug-eluting stent was successfully placed using a STENT SYNERGY DES 3.5X16, postdilated to 4 mm.  Post intervention, there is a 0% residual stenosis.  RPDA lesion is 85% stenosed.  A drug-eluting stent was successfully placed using a STENT SYNERGY DES 2.25X16.  Post intervention, there is a 0% residual stenosis.  Prox Cx lesion is 100% stenosed. This is a chronic total occlusion.  Prox LAD lesion is 25% stenosed.  Mid LAD lesion is 25% stenosed.  The left ventricular systolic function is normal.  LV end diastolic pressure is normal.  The left ventricular ejection fraction is 50-55% by visual estimate.  There is no aortic valve stenosis.  LV end diastolic pressure is normal.  CO 6.5 L/min; CI 2.83, PA sat 71%; Ao sat 97%; mean PCWP 10 mm Hg   COntinue aggressive secondary prevention.   Consider CTO PCI of the circumflex in the future.    Intervention    ECHO: 08/30/2017 - Left ventricle: The cavity size was mildly dilated. Wall   thickness was increased in a pattern of mild LVH. Systolic   function was normal. The estimated ejection fraction was in the  range of 55% to 60%. Wall motion was normal; there were no   regional wall motion abnormalities. Doppler parameters are   consistent with abnormal left ventricular relaxation (grade 1   diastolic dysfunction). - Aortic valve: There was trivial regurgitation. - Aortic root: The aortic root was mildly dilated. - Mitral valve: There was mild regurgitation. - Pulmonary arteries: Systolic pressure was moderately increased.   PA peak pressure: 49 mm Hg (S). Impressions: - Normal LV systolic function; mild LVH and LVE; mild diastolic   dysfunction; trace AI; mildly dilated  aortic root; mild MR; mild   TR; moderately elevated pulmonary pressure.   Past Medical History:  Diagnosis Date  . Allergy   . 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 CAD (coronary artery disease)    08/2017 PCI/DES mRCA, RPDA, CTO pf pLCX with collaterals, normal EF  . Depression    on multiple meds. Has been seen at [facility]. and Dr. Sabra Heck is his prescriber  . Diverticulitis of colon 2000 's   treated as an outpatient  . GERD (gastroesophageal reflux disease)    UGI done August '12 - ulcer/. Dr. Ferdinand Lango, gastroenterologist in [facility].   [name] Hx of adenomatous colonic polyps   . Hyperlipidemia    last lipid panel: HDL 44, LDL 117  . Hypertension   . Myocardial infarction ([place])   . Peptic ulcer    in the past and just recently-August '12  . Pulmonary emboli [facility])    noted October 2014 - treated by Dr. Linda Hedges  . skin cancer    skin CA  . Sleep apnea, primary central    wears CPAP   Past Surgical History:  Procedure Laterality Date  . ANAL RECTAL MANOMETRY N/A 11/30/2016   Procedure: ANO RECTAL MANOMETRY;  Surgeon: Mauri Pole, MD;  Location: [facility] ENDOSCOPY;  Service: Endoscopy;  Laterality: N/A;  . CARDIAC CATHETERIZATION    . CHOLECYSTECTOMY  1990   laproscopic   . CORONARY STENT INTERVENTION N/A 09/01/2017   Procedure: CORONARY STENT INTERVENTION;  Surgeon: Jettie Booze, MD;  Location: [facility] CV LAB;  Service: Cardiovascular;  Laterality: N/A;  . HERNIA REPAIR    . JOINT REPLACEMENT     knee  . PROSTATE SURGERY     needle biopsy's, TURP  . RIGHT/LEFT HEART CATH AND CORONARY ANGIOGRAPHY N/A 09/01/2017   Procedure: RIGHT/LEFT HEART CATH AND CORONARY ANGIOGRAPHY;  Surgeon: Jettie Booze, MD;  Location: [facility] [address] CV LAB;  Service: Cardiovascular;  Laterality: N/A;  . TKR bilateral  2006   Dr. Violet Baldy  . UPPER GASTROINTESTINAL ENDOSCOPY    . VASECTOMY       No outpatient medications have been marked as taking for the  12/12/18 encounter (Appointment) with Minal Stuller, Evelene Croon, PA-C.     Allergies:   Cephalexin and Levofloxacin   Social History   Tobacco Use  . Smoking status: Never Smoker  . Smokeless tobacco: Never Used  Substance Use Topics  . Alcohol use: Yes    Alcohol/week: 1.0 standard drinks    Types: 1 Standard drinks or equivalent per week    Comment: social  . Drug use: No     Family Hx: The patient's family history includes Colon cancer in his cousin, cousin, father, paternal aunt, paternal uncle, paternal uncle, and sister; Heart disease in his father, mother, paternal grandfather, and paternal uncle; Hypertension in his mother; Stroke in his mother. There is no history of Stomach cancer, Rectal  cancer, or Esophageal cancer.  ROS:   Please see the history of present illness.    *** All other systems reviewed and are negative.   Labs/Other Tests and Data Reviewed:    Recent Labs: 04/24/2018: ALT 34; Magnesium 1.8; TSH 0.470 05/24/2018: BUN 14; Creatinine, Ser 1.07; Hemoglobin 12.6; Platelets 230.0; Potassium 3.9; Sodium 142   Recent Lipid Panel Lab Results  Component Value Date/Time   CHOL 166 09/01/2017 05:38 AM   TRIG 109 09/01/2017 05:38 AM   HDL 30 (L) 09/01/2017 05:38 AM   CHOLHDL 5.5 09/01/2017 05:38 AM   LDLCALC 114 (H) 09/01/2017 05:38 AM   LDLDIRECT 64.5 03/26/2013 04:28 PM    Wt Readings from Last 3 Encounters:  07/03/18 227 lb (103 kg)  06/12/18 220 lb (99.8 kg)  06/05/18 231 lb (104.8 kg)     Objective:    Vital Signs:  There were no vitals taken for this visit.   Well nourished, well developed male in no*** acute distress. ***  ASSESSMENT & PLAN:    1.  ***  COVID-19 Education: The signs and symptoms of COVID-19 were discussed with the patient and how to seek care for testing (follow up with PCP or arrange E-visit).  ***The importance of social distancing was discussed today.  Patient Risk:   After full review of this patient's clinical status, I  feel that they are at least moderate risk at this time.  Time:   Today, I have spent *** minutes with the patient with telehealth technology discussing ***.     Medication Adjustments/Labs and Tests Ordered: Current medicines are reviewed at length with the patient today.  Concerns regarding medicines are outlined above.  Tests Ordered: No orders of the defined types were placed in this encounter.  Medication Changes: No orders of the defined types were placed in this encounter.   Disposition:  Follow up {follow up:15908}  Signed, Rosaria Ferries, PA-C  12/05/2018 1:32 PM    [facility]

## 2018-12-07 ENCOUNTER — Other Ambulatory Visit: Payer: Self-pay | Admitting: Cardiovascular Disease

## 2018-12-07 ENCOUNTER — Telehealth: Payer: Self-pay | Admitting: Physician Assistant

## 2018-12-07 MED ORDER — PANTOPRAZOLE SODIUM 40 MG PO TBEC: 40.0000 mg | DELAYED_RELEASE_TABLET | Freq: Every day | ORAL | 1 refills | Status: AC

## 2018-12-07 MED ORDER — CLOPIDOGREL BISULFATE 75 MG PO TABS: 75.0000 mg | ORAL_TABLET | Freq: Every day | ORAL | 1 refills | Status: AC

## 2018-12-07 NOTE — Telephone Encounter (Signed)
Called pt son to see if able to do virtual visit with pt. Pt son stated father has been doing great, no cardiac symptoms. Pt son also stated ok to wait for in-person visit in August with Dr. Gwenlyn Blackburn. Pt son also stated visit was just to have medications refilled.   Told pt son I will send in refills and route this to cancel pool to be rescheduled in August 2020. Pt son verbalized thanks.  Appt was originally scheduled on 12/12/18 with John Ferries, PA.      Primary Cardiologist:  John Burow, MD   Patient contacted.  History reviewed.  No symptoms to suggest any unstable cardiac conditions.  Based on discussion, with current pandemic situation, we will be postponing this appointment for John Blackburn with a plan for f/u in AUGUST 2020 or sooner if feasible/necessary.  If symptoms change, he has been instructed to contact our office.   Routing to C19 CANCEL pool for tracking (P CV DIV CV19 CANCEL - reason for visit "other.") and assigning priority (1 = 4-6 wks, 2 = 6-12 wks, 3 = >12 wks).   Therisa Doyne  12/07/2018 3:56 PM         .

## 2018-12-10 ENCOUNTER — Other Ambulatory Visit: Payer: Self-pay | Admitting: *Deleted

## 2018-12-10 NOTE — Patient Outreach (Signed)
[facility]) Care Management  12/10/2018  SCHUYLER OLDEN 1946-02-03 [id number]   CSW was able to make contact with pt who reports he is doing well- no concerns or complaints. CSW left message for son to inquire about updates and plans for pt (Placement?) and await callback.    Eduard Clos, MSW, [place] Worker  [place] [phone]

## 2018-12-12 ENCOUNTER — Ambulatory Visit: Payer: PPO | Admitting: Physician Assistant

## 2018-12-18 ENCOUNTER — Ambulatory Visit: Payer: PPO | Admitting: Psychology

## 2018-12-20 ENCOUNTER — Encounter: Payer: PPO | Attending: Psychology | Admitting: Psychology

## 2018-12-20 ENCOUNTER — Encounter: Payer: Self-pay | Admitting: Psychology

## 2018-12-20 ENCOUNTER — Other Ambulatory Visit: Payer: Self-pay

## 2018-12-20 ENCOUNTER — Encounter

## 2018-12-20 DIAGNOSIS — G4731 Primary central sleep apnea: Secondary | ICD-10-CM | POA: Insufficient documentation

## 2018-12-20 DIAGNOSIS — Z86010 Personal history of colonic polyps: Secondary | ICD-10-CM | POA: Insufficient documentation

## 2018-12-20 DIAGNOSIS — Z86711 Personal history of pulmonary embolism: Secondary | ICD-10-CM | POA: Insufficient documentation

## 2018-12-20 DIAGNOSIS — E785 Hyperlipidemia, unspecified: Secondary | ICD-10-CM | POA: Insufficient documentation

## 2018-12-20 DIAGNOSIS — Z79899 Other long term (current) drug therapy: Secondary | ICD-10-CM | POA: Insufficient documentation

## 2018-12-20 DIAGNOSIS — I2510 Atherosclerotic heart disease of native coronary artery without angina pectoris: Secondary | ICD-10-CM | POA: Insufficient documentation

## 2018-12-20 DIAGNOSIS — R413 Other amnesia: Secondary | ICD-10-CM | POA: Diagnosis not present

## 2018-12-20 DIAGNOSIS — K219 Gastro-esophageal reflux disease without esophagitis: Secondary | ICD-10-CM | POA: Insufficient documentation

## 2018-12-20 DIAGNOSIS — I252 Old myocardial infarction: Secondary | ICD-10-CM | POA: Insufficient documentation

## 2018-12-20 DIAGNOSIS — F0150 Vascular dementia without behavioral disturbance: Secondary | ICD-10-CM | POA: Diagnosis not present

## 2018-12-20 DIAGNOSIS — Z7982 Long term (current) use of aspirin: Secondary | ICD-10-CM | POA: Insufficient documentation

## 2018-12-20 DIAGNOSIS — I10 Essential (primary) hypertension: Secondary | ICD-10-CM | POA: Insufficient documentation

## 2018-12-20 NOTE — Progress Notes (Signed)
12/20/2018: Today I provided feedback regarding the results of the recent neuropsychological evaluation.  Below you will find the summary and impressions from the appointment and formal report that can be found in his medical chart dated 11/09/2018.  Today the appointment was conducted via WebEx and the patient and his son were both present on their end of the video conference call.  The call was made from my office today and included video and audio.  The patient does have some significant and profound cognitive deficits with memory primarily related to deficits of encoding the thin have a profound effect on further learning including short-term memory and long-term memory functions.  This was true for both auditory and visual memory functions.  The patient had his best areas of functioning with regard to expressive language abilities and to a lesser degree visual-spatial abilities although they were both mildly to moderately impaired.  The patient had profound deficits with regard to attention and encoding and overall information processing speed.  The patient was globally impaired.  The pattern of these impairments are not consistent with cortically mediated degenerative dementia such as Alzheimer's but are consistent with his MRI findings of some significant small vessel disease and white matter involvement as well as his previous strokes.  Below you will find the results from that evaluation that were provided during this feedback.   Summary of Results:                        Overall, the current objective assessment over a broad range of neuropsychological/cognitive areas, show that the patient performed in the extremely low/significantly impaired range with regard to his global neuropsychological performance as indicated by his total score index on the RBANS.  The patient's best performing area has to do with expressive language, where he performed at the 16th percentile globally.  The patient did much better  with regard to targeted/cued naming recall where he was between the 25th to the 50th percentile of performance.  The patient had greater difficulties with regard to verbal fluency and free recall of words.  The patient showed significant and profound deficits with regard to visual spatial/visual constructional abilities, primary attention and concentration measures including auditory and visual encoding abilities as well as overall information processing speed and focus execute abilities.  The patient also displayed significant and profound deficits with regard to immediate learning and memory as well as delayed learning and memory on both the RBANS as well as the [place].  These memory and learning deficits were seen in all domains assessed including initial encoding abilities, visual memory and auditory memory.  The patient also displayed significant profound deficits with regard to delayed memory functioning.  Impression/Diagnosis:                     The results of the current objective neuropsychological evaluation are consistent with significant and global cognitive deficits.  The patient has had significant cerebrovascular changes/events including small vessel disease and history of multiple lacunar strokes as well as a history of myocardial infarction.  The patient showed significant geographical disorientation, significant loss in ADLs including preparing food and managing his medications as well as significant executive functioning deficits.  While the patient is able to effectively communicate and express his thoughts and feelings, he is no longer able to take care of himself independently given the level of memory, executive functioning, visual-spatial, and attentional deficits observed as indicated by both clinical information  provided by the patient and his son and the objective neuropsychological assessment.  I do think that it is clearly time for the patient to be aided with  skilled nursing placement as the patient's need for care is now outstripping the abilities for in-home health care.  The patient's diagnostic picture given objective neurological findings related to multiple lacunar strokes as well as significant small vessel disease would be the most likely explanation as far as etiological factors regarding his cognitive decline.  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  I will provide feedback to the patient and his family and coordinate care with his treating neurologist in any way that is needed.  Diagnosis:                               Axis I: Vascular dementia without behavioral disturbance ([place])  Memory loss due to medical condition   Ilean Skill, Psy.D. Neuropsychologist

## 2018-12-27 ENCOUNTER — Other Ambulatory Visit: Payer: Self-pay | Admitting: *Deleted

## 2018-12-27 NOTE — Patient Outreach (Signed)
[facility]) Care Management  12/27/2018  ALEKS NAWROT 1946-08-07 [id number]   [place] Initial Assessment  Referral Date:06/22/2018 Referral Source:Transfer from [facility] Nurse Reason for Referral:Continued Disease Management Education Insurance:[employer]   Outreach Attempt:  Outreach attempt #5 to patient's son for verification of continued interest in [place] Management Disease Management program and initial telephone assessment.. No answer. RN Health Coach left HIPAA compliant voicemail message along with contact information.  Plan:  RN Health Coach will make another outreach attempt within the month of May if no return call back from son.  [place] [phone] [email or web address]

## 2019-01-08 ENCOUNTER — Other Ambulatory Visit: Payer: Self-pay | Admitting: Cardiology

## 2019-01-30 ENCOUNTER — Other Ambulatory Visit: Payer: Self-pay | Admitting: *Deleted

## 2019-01-30 NOTE — Patient Outreach (Signed)
[facility] [place] [facility]) Care Management  01/30/2019  HEITH HAIGLER 1946-04-06 [id number]   [place] Initial Assessment  Referral Date:06/22/2018 Referral Source:Transfer from [facility] Nurse Reason for Referral:Continued Disease Management Education Insurance:[employer]age   Outreach Attempt:  Outreach attempt #6 to patient's son for verification of continued interest in [place] Management Disease Management program and possible initial telephone assessment. No answer. RN Health Coach left HIPAA compliant voicemail message along with contact information.  Plan:  RN Health Coach will make another outreach attempt within the month of June.  [place] [phone] [email or web address]

## 2019-02-11 ENCOUNTER — Other Ambulatory Visit: Payer: Self-pay | Admitting: Internal Medicine

## 2019-02-11 ENCOUNTER — Other Ambulatory Visit: Payer: Self-pay | Admitting: Cardiovascular Disease

## 2019-02-15 ENCOUNTER — Other Ambulatory Visit: Payer: Self-pay | Admitting: *Deleted

## 2019-02-21 ENCOUNTER — Other Ambulatory Visit: Payer: Self-pay

## 2019-02-21 ENCOUNTER — Emergency Department (HOSPITAL_COMMUNITY)
Admission: EM | Admit: 2019-02-21 | Discharge: 2019-02-21 | Disposition: A | Discharge status: Home / Self Care | Payer: PPO | Attending: Emergency Medicine | Admitting: Emergency Medicine

## 2019-02-21 ENCOUNTER — Emergency Department (HOSPITAL_COMMUNITY): Payer: PPO

## 2019-02-21 DIAGNOSIS — R0902 Hypoxemia: Secondary | ICD-10-CM | POA: Diagnosis not present

## 2019-02-21 DIAGNOSIS — W19XXXA Unspecified fall, initial encounter: Secondary | ICD-10-CM

## 2019-02-21 DIAGNOSIS — S0990XA Unspecified injury of head, initial encounter: Secondary | ICD-10-CM | POA: Diagnosis not present

## 2019-02-21 DIAGNOSIS — Y929 Unspecified place or not applicable: Secondary | ICD-10-CM | POA: Insufficient documentation

## 2019-02-21 DIAGNOSIS — S79812A Other specified injuries of left hip, initial encounter: Secondary | ICD-10-CM | POA: Diagnosis not present

## 2019-02-21 DIAGNOSIS — I252 Old myocardial infarction: Secondary | ICD-10-CM | POA: Insufficient documentation

## 2019-02-21 DIAGNOSIS — M25562 Pain in left knee: Secondary | ICD-10-CM | POA: Diagnosis not present

## 2019-02-21 DIAGNOSIS — Z7982 Long term (current) use of aspirin: Secondary | ICD-10-CM | POA: Insufficient documentation

## 2019-02-21 DIAGNOSIS — I2510 Atherosclerotic heart disease of native coronary artery without angina pectoris: Secondary | ICD-10-CM | POA: Insufficient documentation

## 2019-02-21 DIAGNOSIS — Z7902 Long term (current) use of antithrombotics/antiplatelets: Secondary | ICD-10-CM | POA: Insufficient documentation

## 2019-02-21 DIAGNOSIS — Y9301 Activity, walking, marching and hiking: Secondary | ICD-10-CM | POA: Diagnosis not present

## 2019-02-21 DIAGNOSIS — Y999 Unspecified external cause status: Secondary | ICD-10-CM | POA: Diagnosis not present

## 2019-02-21 DIAGNOSIS — W010XXA Fall on same level from slipping, tripping and stumbling without subsequent striking against object, initial encounter: Secondary | ICD-10-CM | POA: Insufficient documentation

## 2019-02-21 DIAGNOSIS — S8982XA Other specified injuries of left lower leg, initial encounter: Secondary | ICD-10-CM | POA: Diagnosis not present

## 2019-02-21 DIAGNOSIS — M25552 Pain in left hip: Secondary | ICD-10-CM | POA: Diagnosis not present

## 2019-02-21 DIAGNOSIS — R58 Hemorrhage, not elsewhere classified: Secondary | ICD-10-CM | POA: Diagnosis not present

## 2019-02-21 DIAGNOSIS — Z96653 Presence of artificial knee joint, bilateral: Secondary | ICD-10-CM | POA: Diagnosis not present

## 2019-02-21 DIAGNOSIS — R52 Pain, unspecified: Secondary | ICD-10-CM | POA: Diagnosis not present

## 2019-02-21 DIAGNOSIS — M542 Cervicalgia: Secondary | ICD-10-CM | POA: Insufficient documentation

## 2019-02-21 DIAGNOSIS — I10 Essential (primary) hypertension: Secondary | ICD-10-CM | POA: Diagnosis not present

## 2019-02-21 DIAGNOSIS — Z79899 Other long term (current) drug therapy: Secondary | ICD-10-CM | POA: Diagnosis not present

## 2019-02-21 DIAGNOSIS — S098XXA Other specified injuries of head, initial encounter: Secondary | ICD-10-CM | POA: Diagnosis not present

## 2019-02-21 DIAGNOSIS — R410 Disorientation, unspecified: Secondary | ICD-10-CM | POA: Diagnosis not present

## 2019-02-21 DIAGNOSIS — S1989XA Other specified injuries of other specified part of neck, initial encounter: Secondary | ICD-10-CM | POA: Diagnosis not present

## 2019-02-21 LAB — CBG MONITORING, ED: Glucose-Capillary: 103 mg/dL — ABNORMAL HIGH (ref 70–99)

## 2019-02-21 IMAGING — CR LEFT KNEE - COMPLETE 4+ VIEW
4 series · 4 of 4 positions shown · non-contrast
Comparison: None.

CLINICAL DATA: Fall.  Pain

EXAM:
LEFT KNEE - COMPLETE 4+ VIEW

[knee ap]
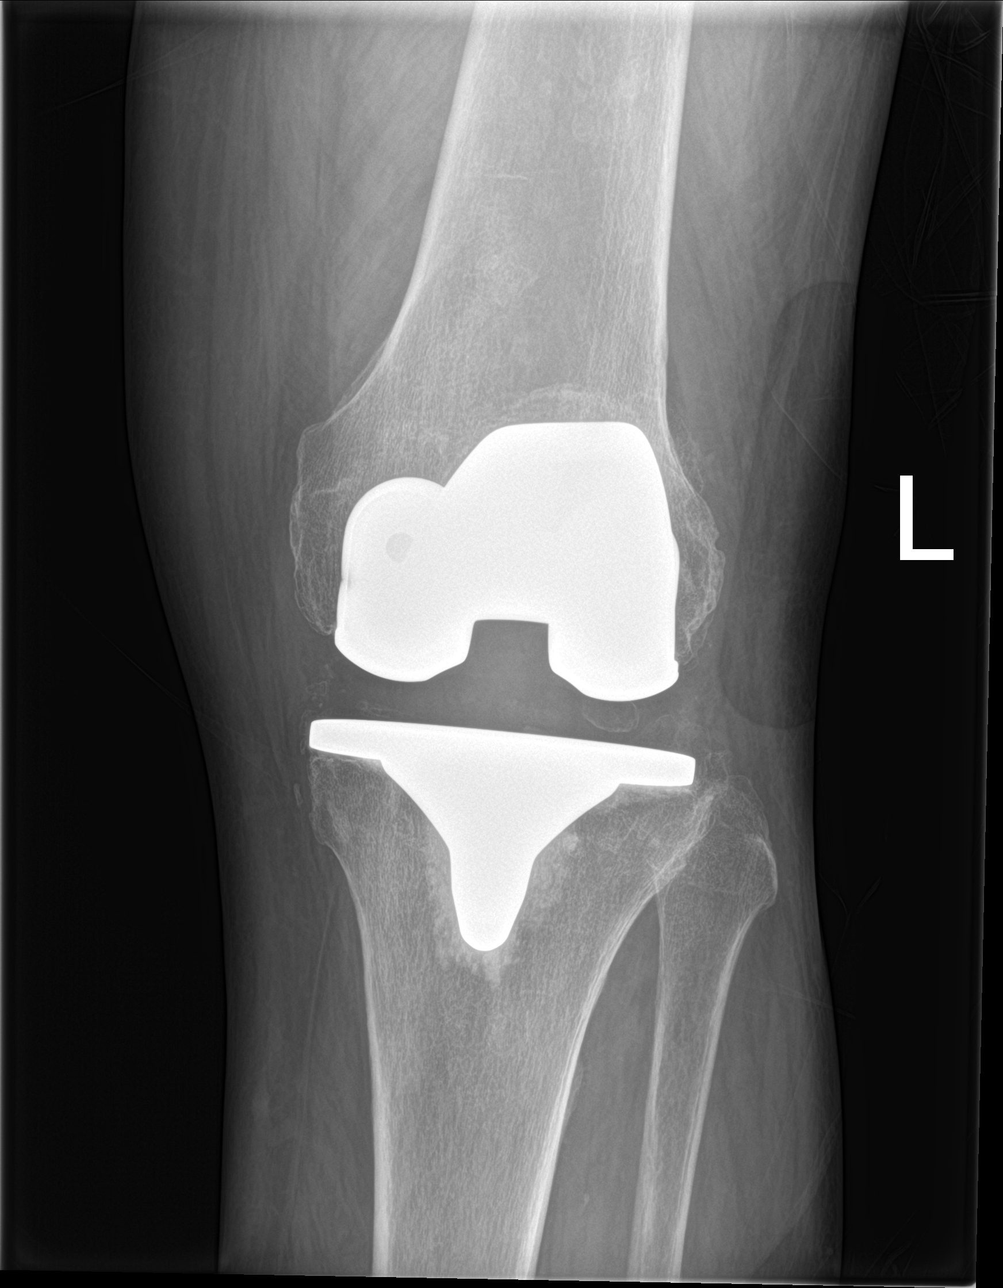

[knee lat]
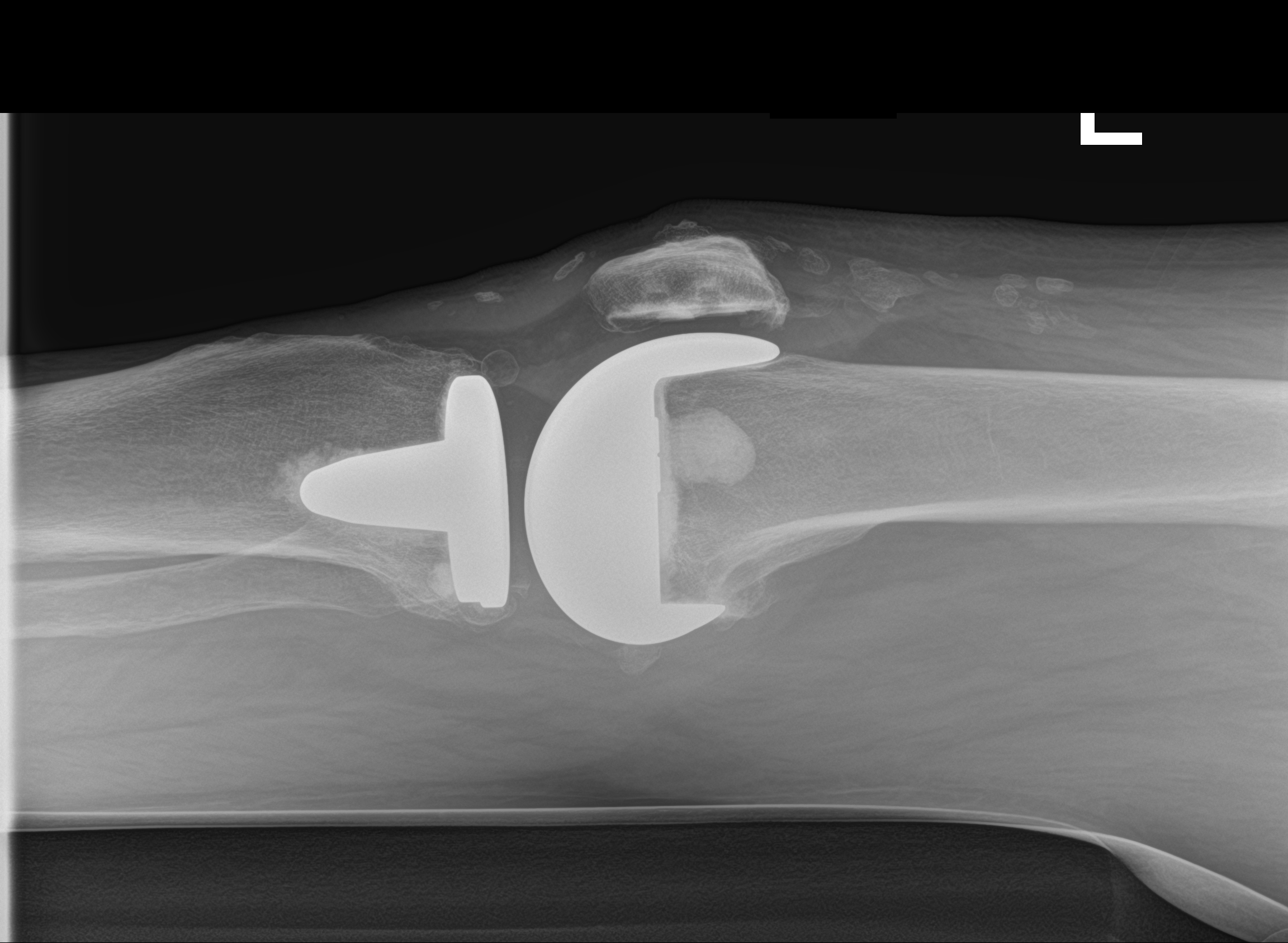

[knee obl (1 of 2)]
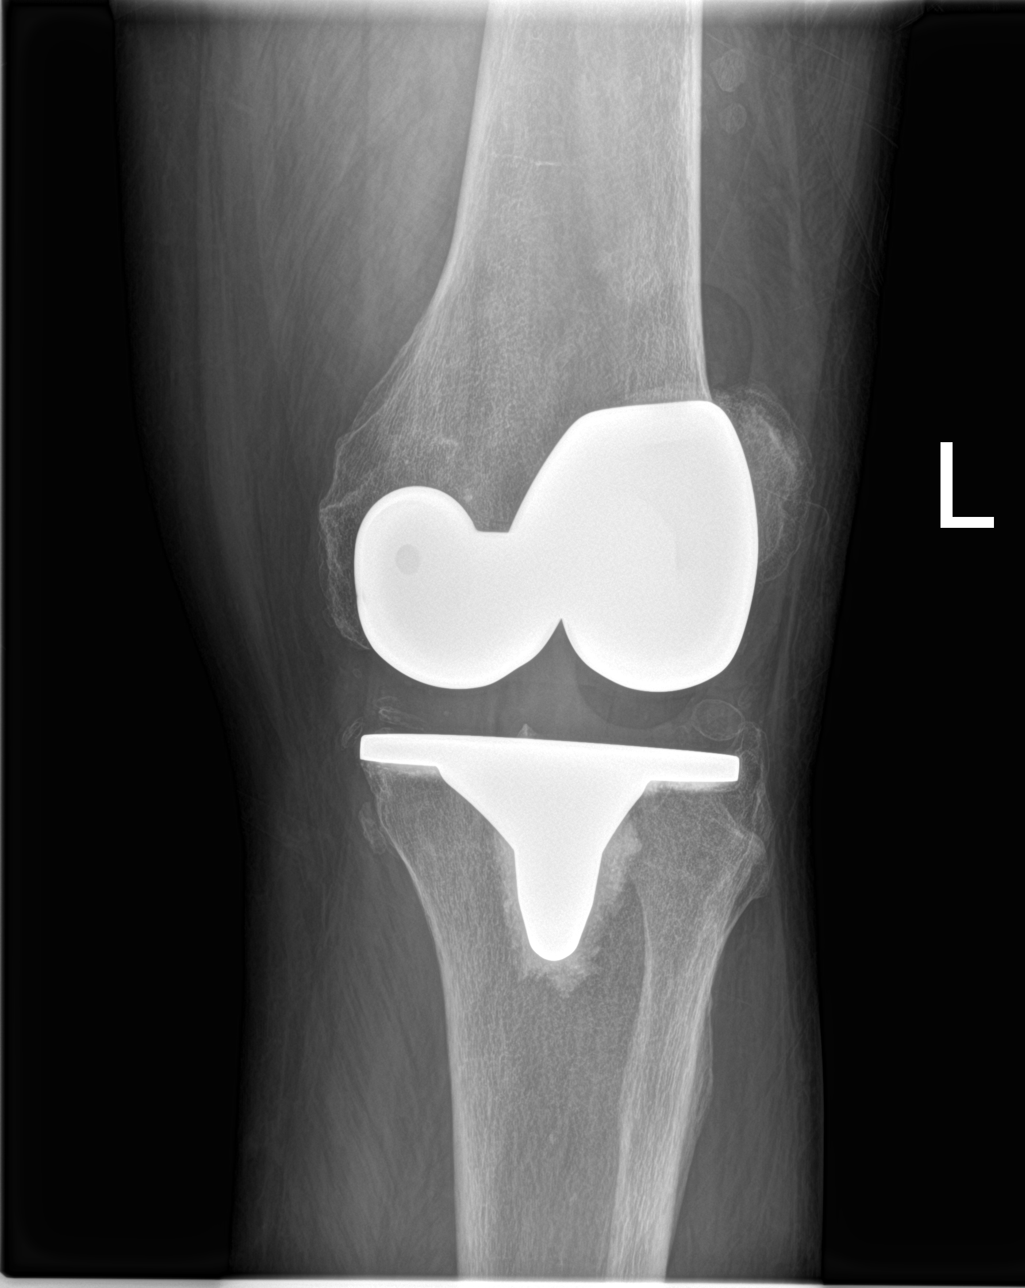

[knee obl (2 of 2)]
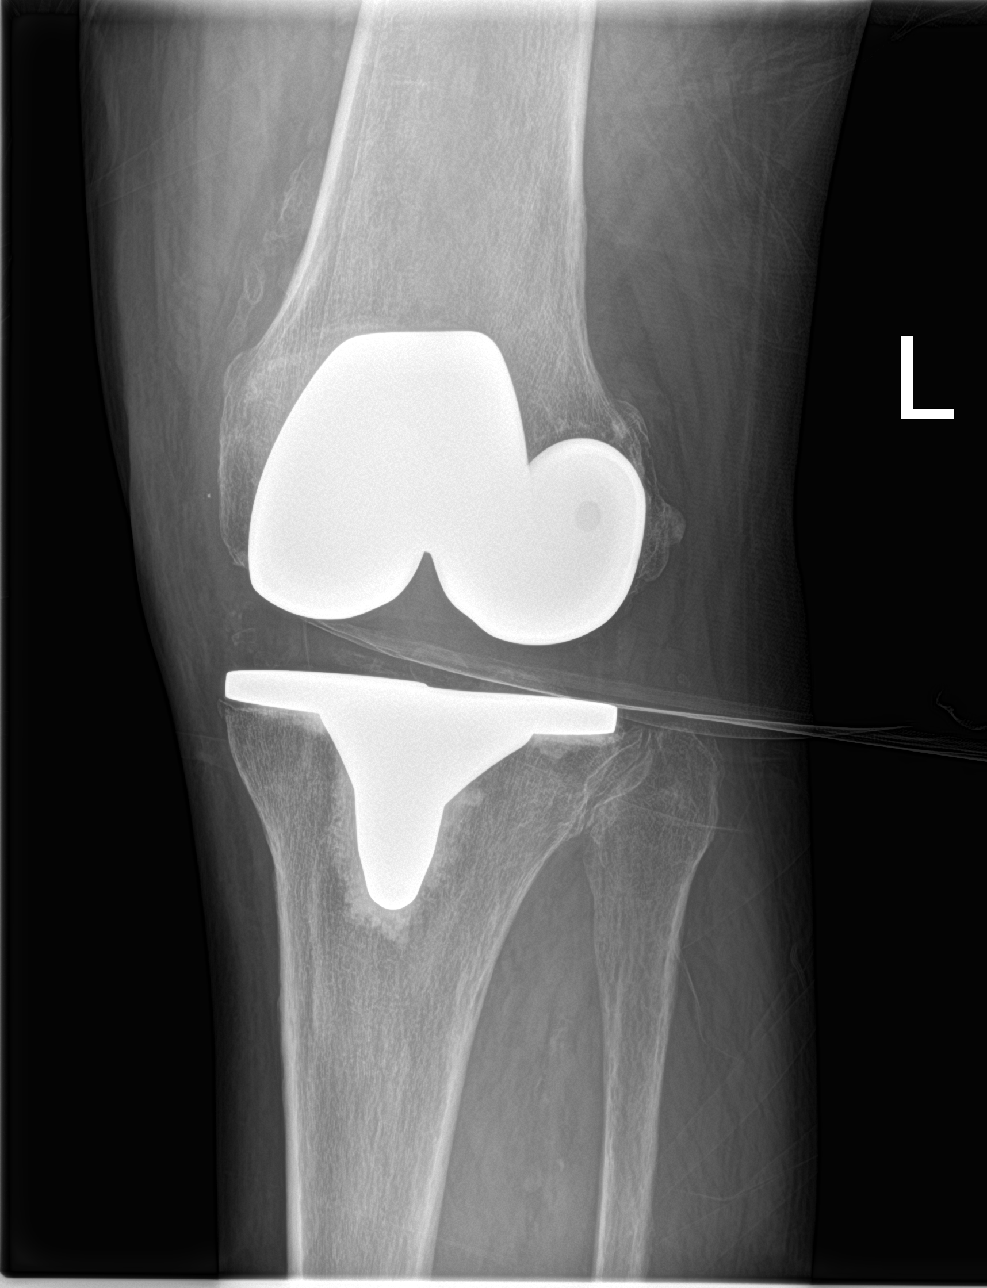

[4 of 4 positions shown; findings below may reference images not displayed]

FINDINGS: Total knee replacement in satisfactory position alignment. No
fracture or joint effusion.
IMPRESSION: No acute abnormality.

## 2019-02-21 IMAGING — CT CT CERVICAL SPINE WITHOUT CONTRAST
4 of 6 series · 16 of 33 positions shown, 18 images · non-contrast
Comparison: CT head [DATE]

CLINICAL DATA: Fall.  Head injury.

EXAM:
CT HEAD WITHOUT CONTRAST
CT CERVICAL SPINE WITHOUT CONTRAST
TECHNIQUE: Multidetector CT imaging of the head and cervical spine was
performed following the standard protocol without intravenous
contrast. Multiplanar CT image reconstructions of the cervical spine
were also generated.

[Series 4: head 5.0 h30s · axial · 0.47mm/px · z∈[-138,-78]mm · 2 of 36 slices shown]
[im 12/36  bone]
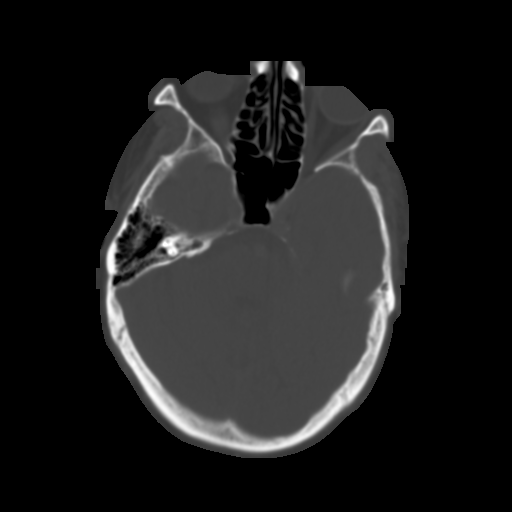
[im 24/36  bone]
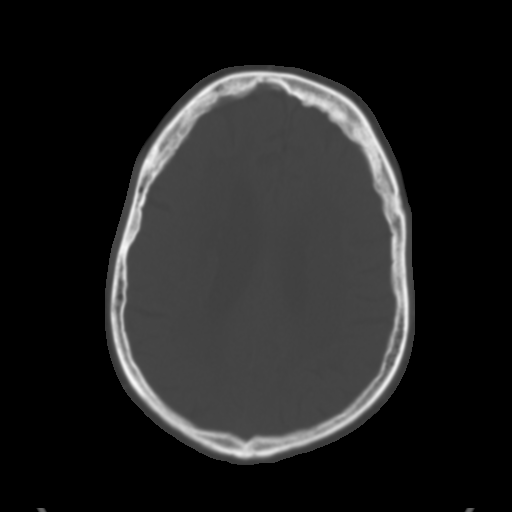

[Series 9: c_spine 2.0 st · axial · 0.47mm/px · z∈[-341,-187]mm · 8 of 99 slices shown, 10 images]
[im 11/99  soft-tissue]
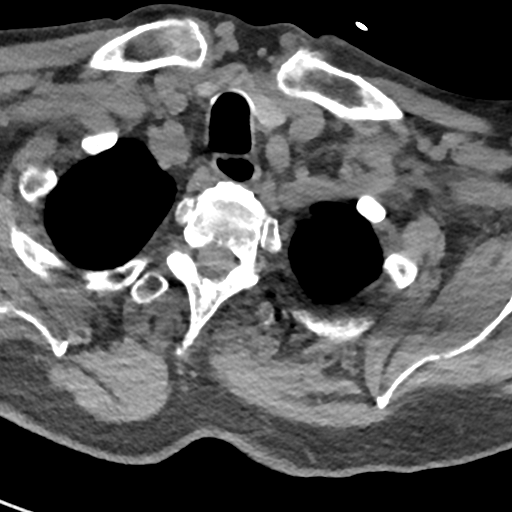
[im 11/99  bone]
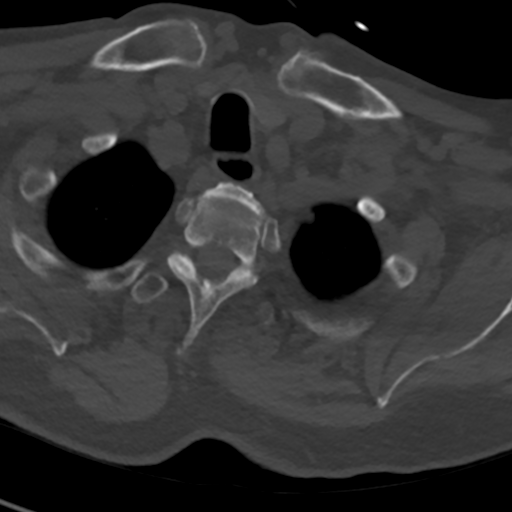
[im 22/99  bone]
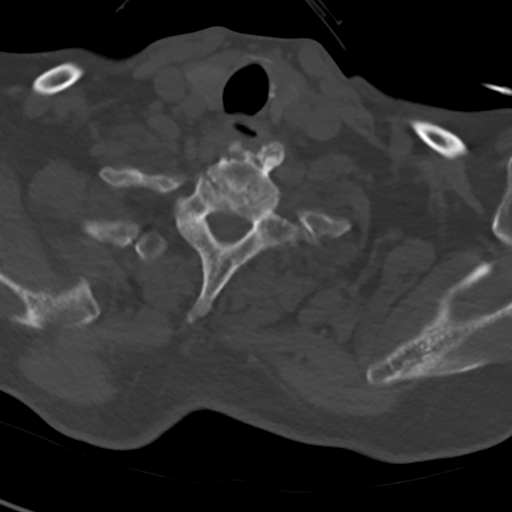
[im 33/99  bone]
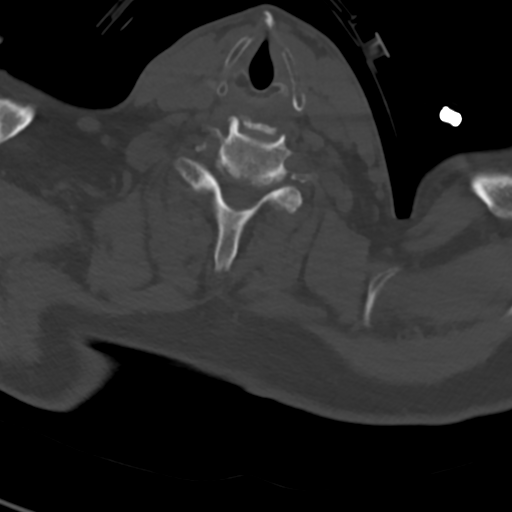
[im 44/99  bone]
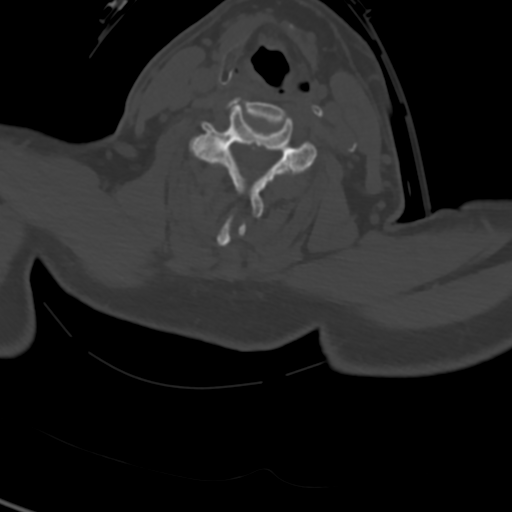
[im 55/99  soft-tissue]
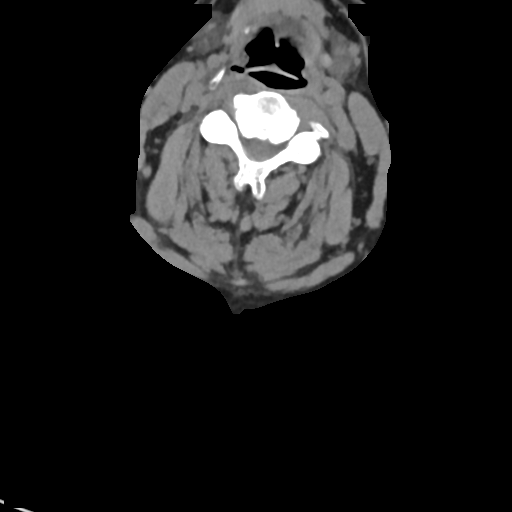
[im 55/99  bone]
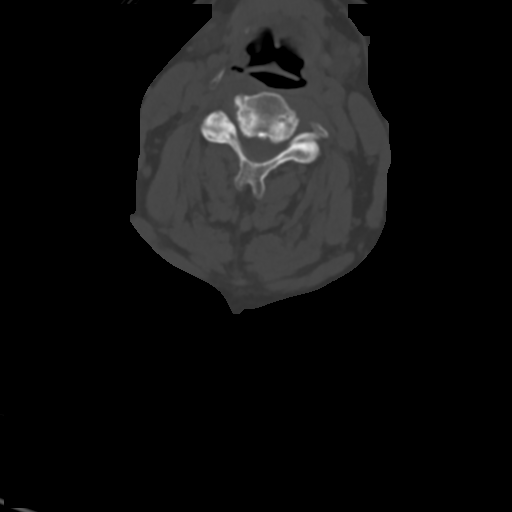
[im 66/99  bone]
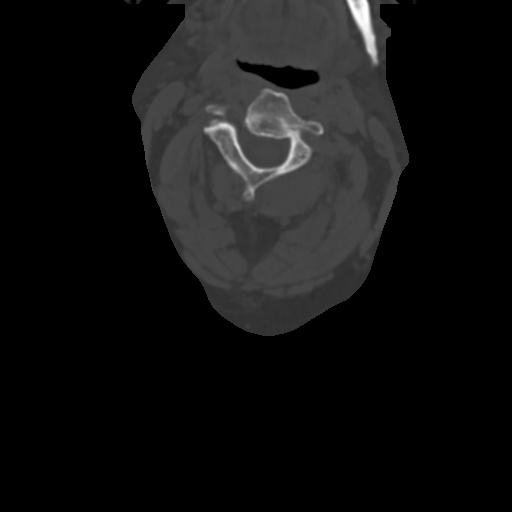
[im 77/99  bone]
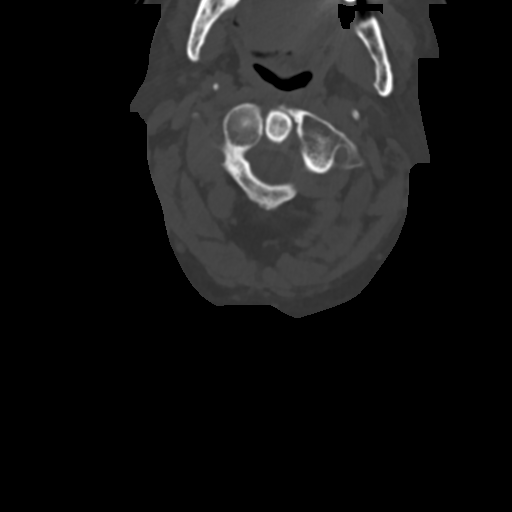
[im 88/99  bone]
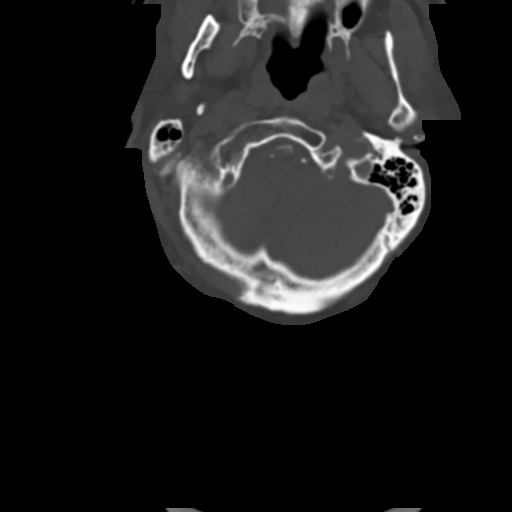

[Series 10: coronal bone · coronal · 0.29mm/px · 1 of 75 slices shown]
[im 38/75  bone]
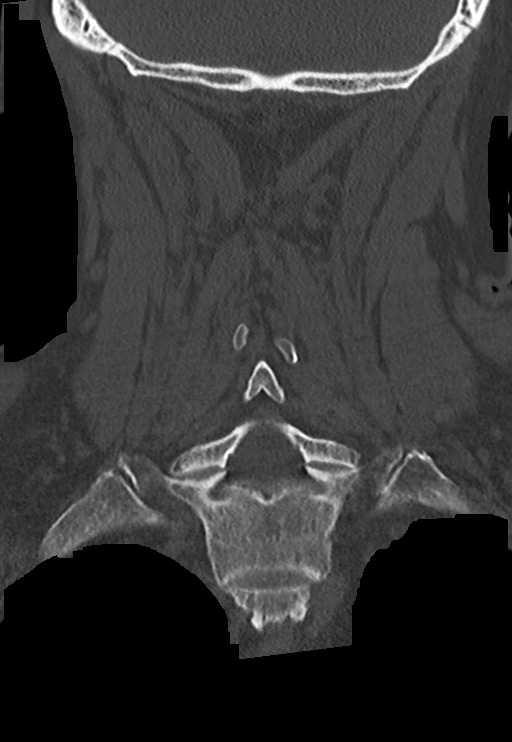

[Series 11: sagittal bone · sagittal · 0.33mm/px · 5 of 67 slices shown]
[im 12/67  bone]
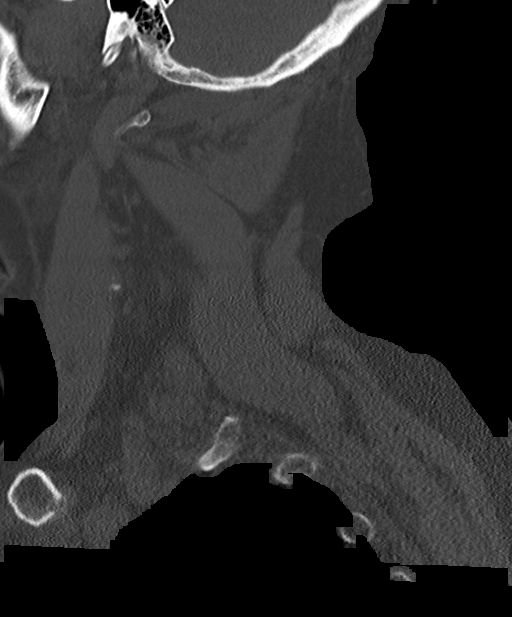
[im 23/67  bone]
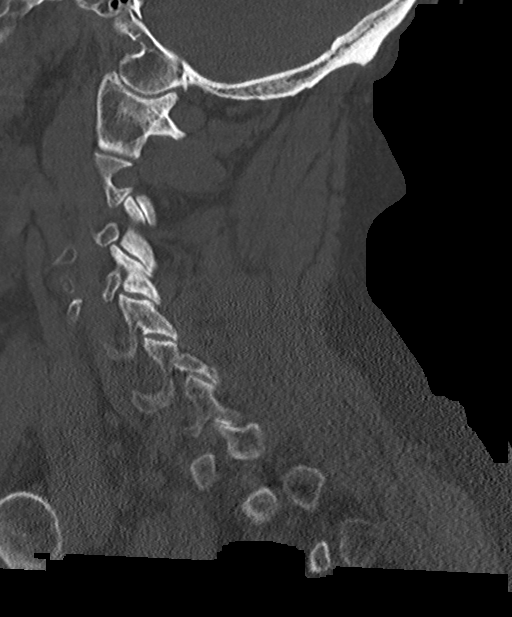
[im 34/67  bone]
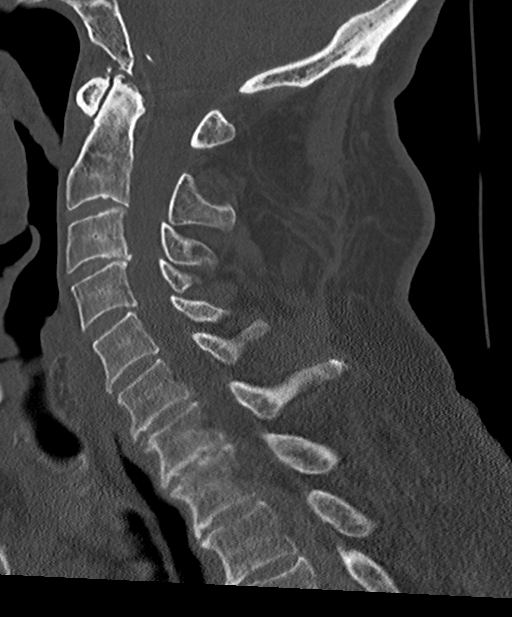
[im 45/67  bone]
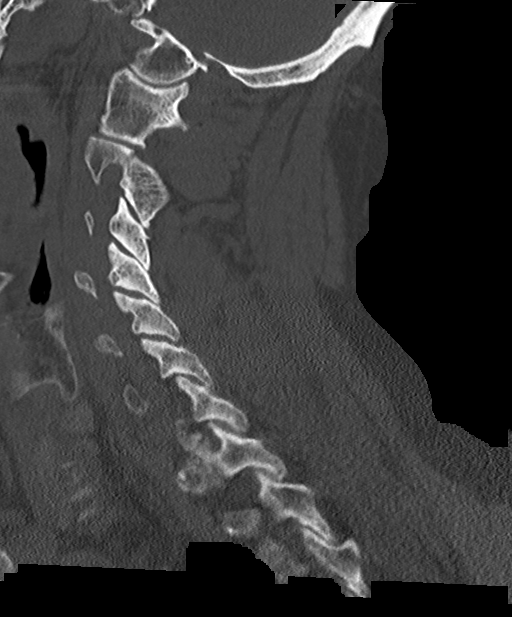
[im 56/67  bone]
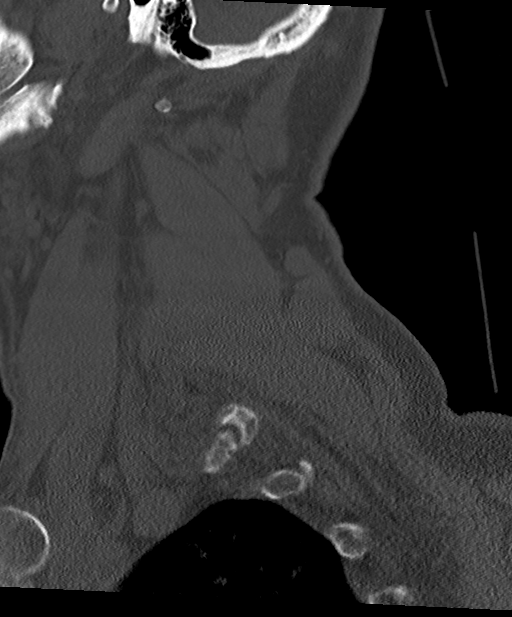

[16 of 33 positions shown; findings below may reference images not displayed]

FINDINGS: CT HEAD FINDINGS

Brain: Generalized atrophy. Chronic microvascular ischemic change
throughout the white matter and basal ganglia. No acute infarct.
Negative for hemorrhage or mass. No midline shift. No change from
the prior.

Vascular: Negative for hyperdense vessel. Atherosclerotic
calcification.

Skull: Negative for skull fracture

Sinuses/Orbits: Mild mucosal edema paranasal sinuses. Negative
orbit.

Other: None

CT CERVICAL SPINE FINDINGS

Alignment: Mild retrolisthesis C3-4 and C4-5

Skull base and vertebrae: Negative for fracture

Soft tissues and spinal canal: Negative

Disc levels: Multilevel disc degeneration and spurring in the
cervical spine. Multilevel spinal and foraminal stenosis due to
spurring.

Upper chest: Negative

Other: None
IMPRESSION: 1. No acute intracranial abnormality. Atrophy with extensive chronic
microvascular ischemia
2. Negative for cervical spine fracture. Moderate cervical
spondylosis.

## 2019-02-21 IMAGING — CR DG HIP (WITH OR WITHOUT PELVIS) 2-3V LEFT
3 series · 3 of 3 positions shown · non-contrast
Comparison: None.

CLINICAL DATA: Fall.  Left hip pain

EXAM:
DG HIP (WITH OR WITHOUT PELVIS) 2-3V LEFT

[pelvis ap]
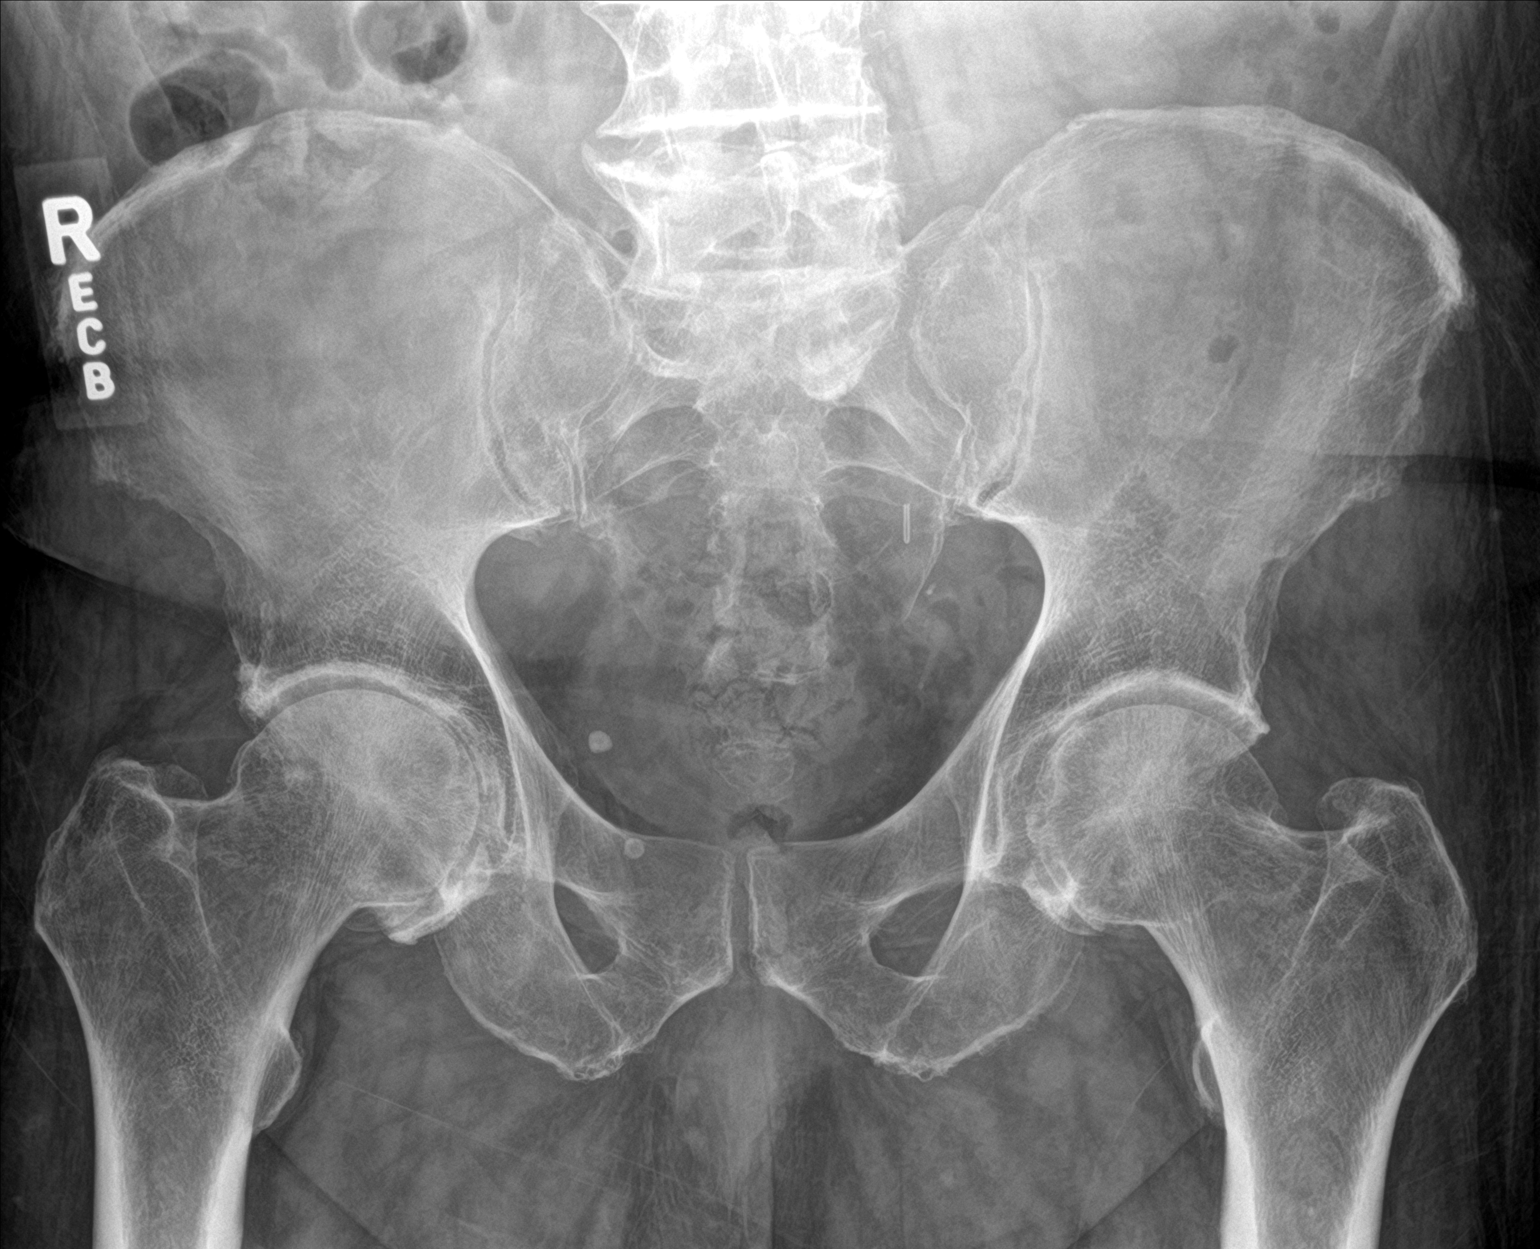

[hip ap]
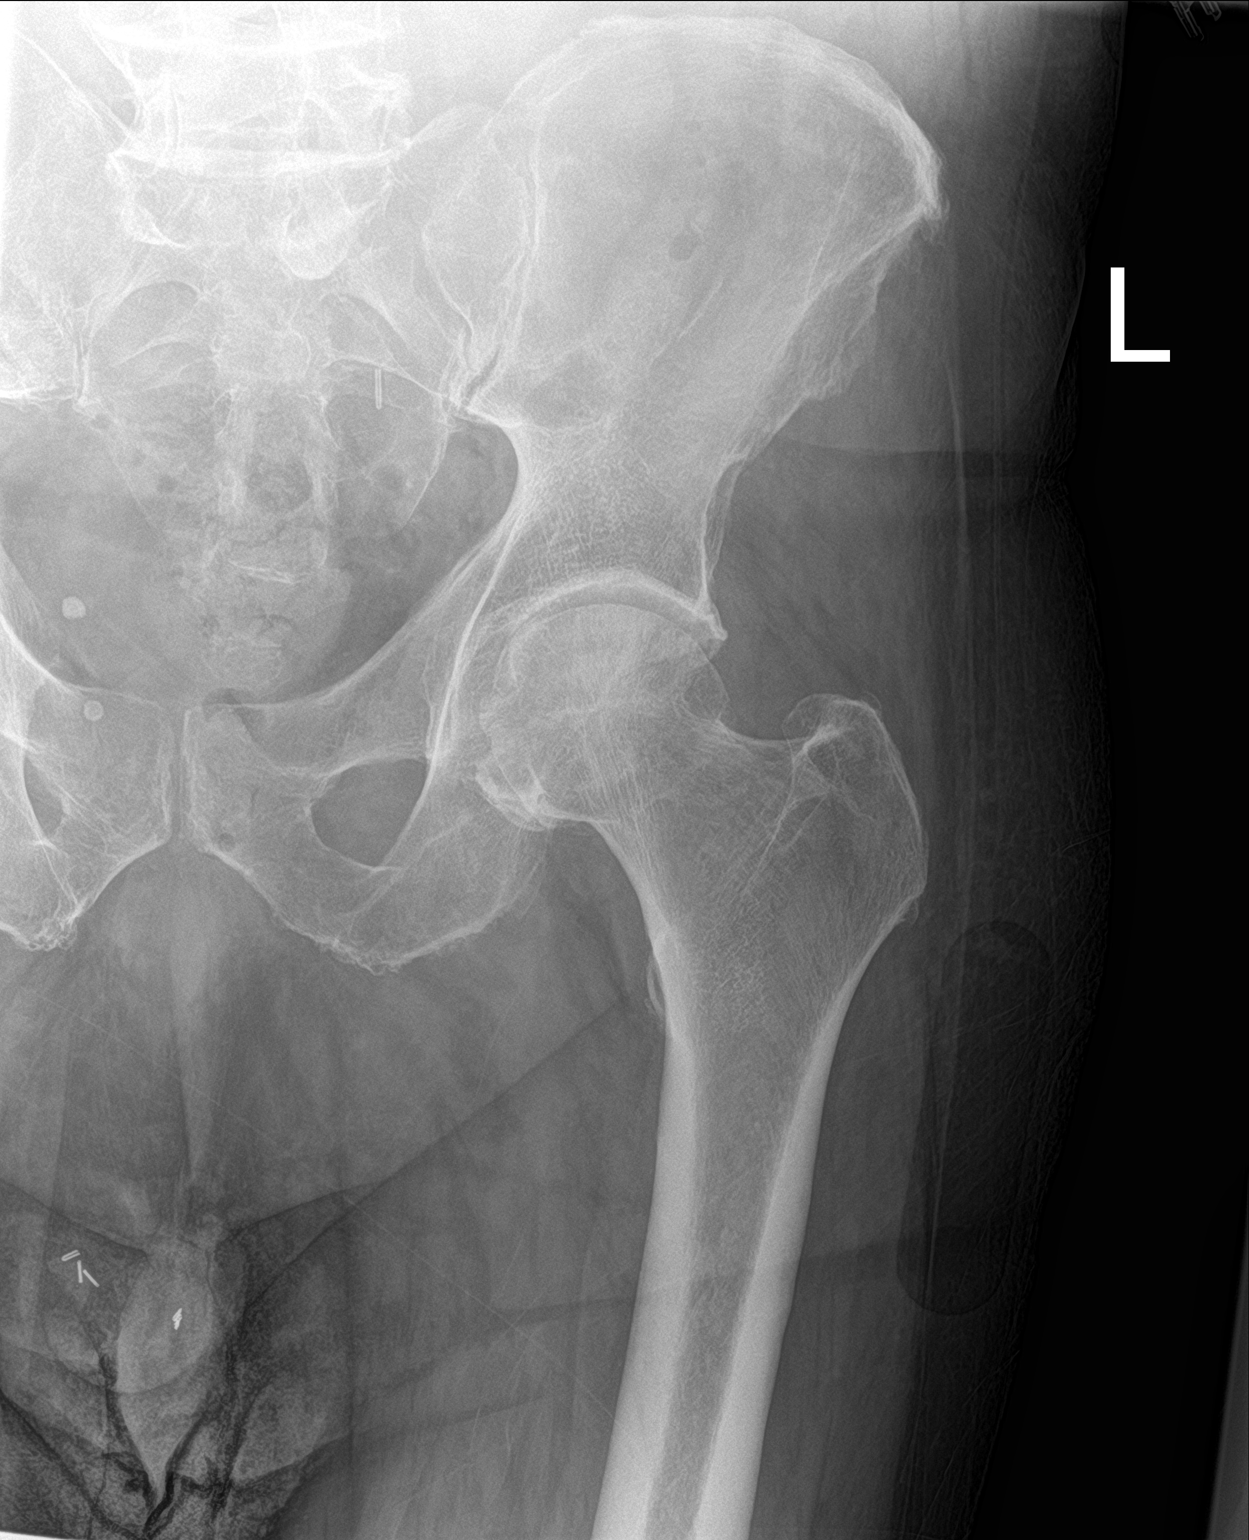

[hip lat]
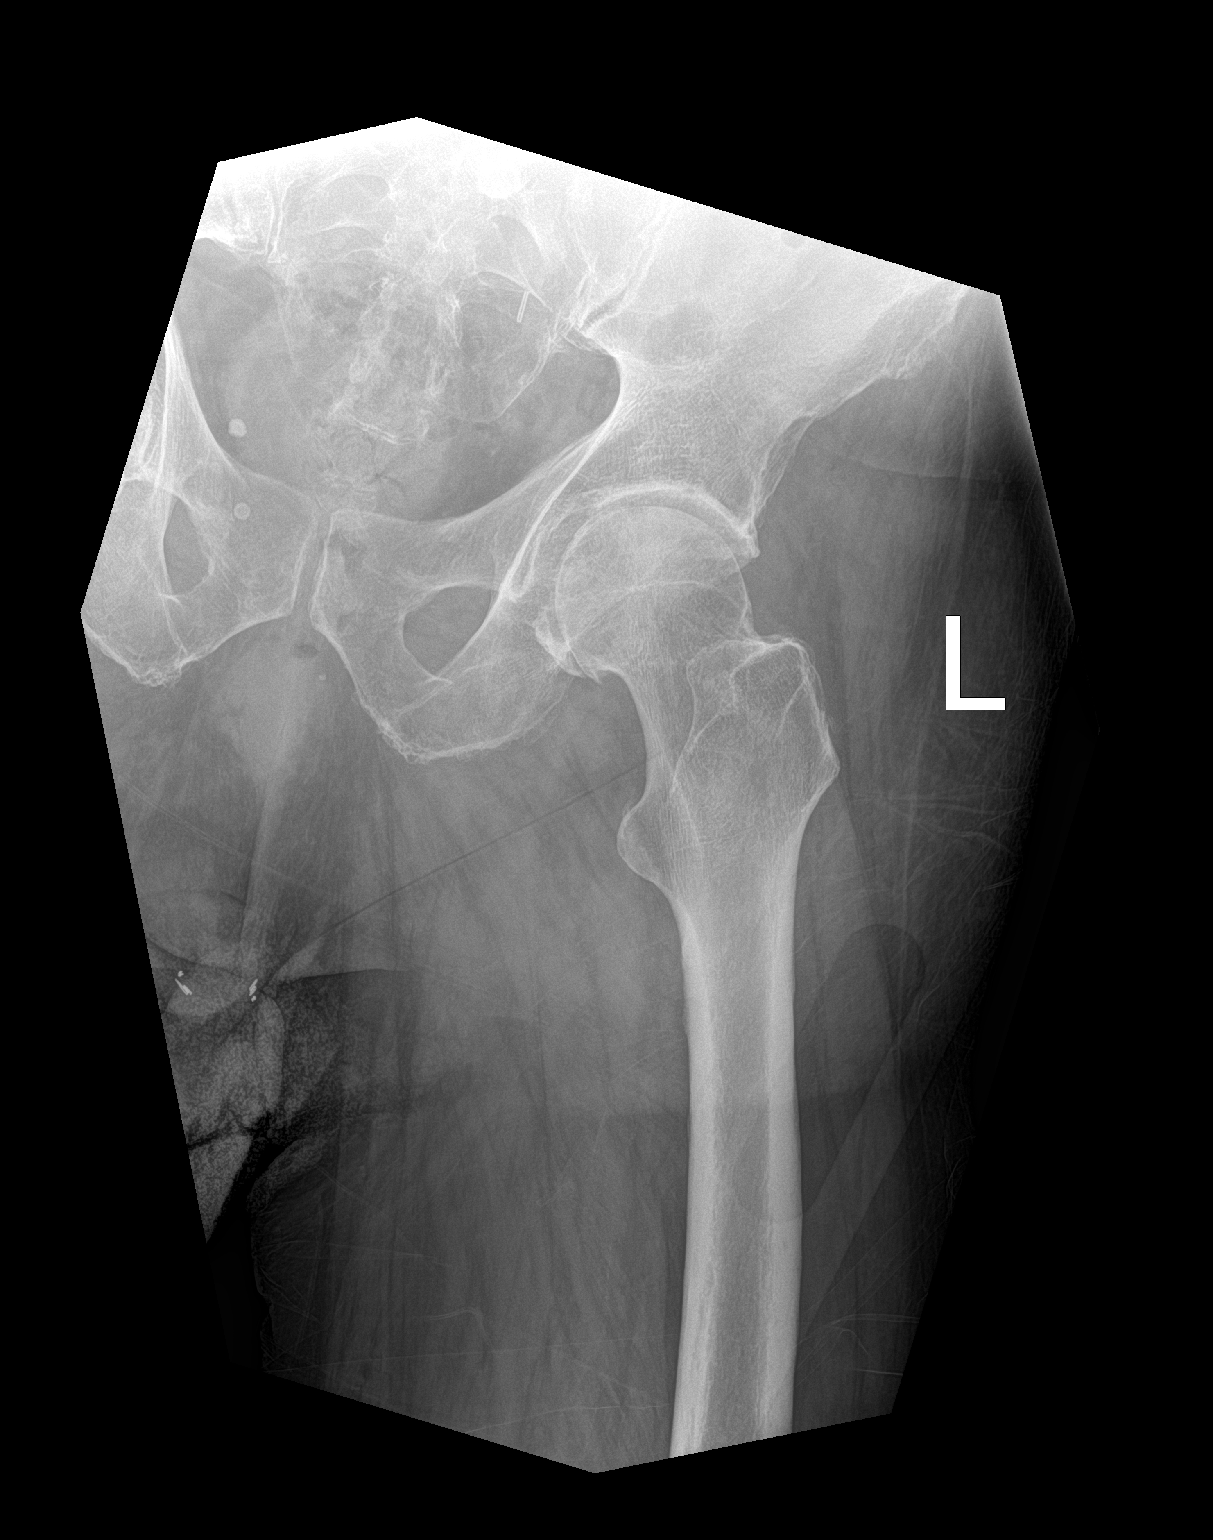

[3 of 3 positions shown; findings below may reference images not displayed]

FINDINGS: Negative for fracture. Mild degenerative change in the left hip and
moderate degenerative change in the right hip. Degenerative change
in the lumbar spine.
IMPRESSION: Negative for fracture.

## 2019-02-21 MED ORDER — ACETAMINOPHEN 325 MG PO TABS
650.0000 mg | ORAL_TABLET | Freq: Once | ORAL | Status: AC
  Administered 2019-02-21: 650 mg via ORAL
  Filled 2019-02-21: qty 2

## 2019-02-21 NOTE — ED Triage Notes (Signed)
Pt BIB [facility] from home. Pt was getting out of car and fall backwards out of car then rolled down driveway. NO LOC. Pt daughter reports he hits his head, but no signs of hematoma or bruising. Opt takes plavix. Pt c/o of neck pain and muscle pain in his abdomen. No obvious sx of injury, Neuro is intact. Pt has hx of dementia and that is his baseline. Pt daughter states he has fallen about 3 other times this week because he forgets to use his walker.

## 2019-02-21 NOTE — ED Notes (Signed)
Contact Pt's son -Malikhi Ogan [phone]

## 2019-02-21 NOTE — ED Notes (Signed)
Pt discharged from ED; instructions provided; Pt encouraged to return to ED if symptoms worsen and to f/u with PCP; Pt verbalized understanding of all instructions 

## 2019-02-21 NOTE — ED Provider Notes (Signed)
[facility] EMERGENCY DEPARTMENT Provider Note   CSN: [id number] Arrival date & time: 02/21/19  1706     History   Chief Complaint Chief Complaint  Patient presents with  . Fall    HPI John Blackburn is a 73 y.o. male who presents with a fall and head injury.  Past medical history significant for coronary artery disease on Plavix, history of PE, GERD/PUD, dementia.  States he was getting out of the car and lost his balance and fell backwards and hit his head.  His daughter was with him at the time.  He denies loss of consciousness.  He was able to get up with help.  EMS was called and he was placed in a c-collar because of complaints of neck pain.  He reports some pain over the back of his head and has endorsed some pain with his left knee with range of motion.  He denies dizziness, vision changes, chest pain, SOB, abdominal pain, N/V, numbness/tingling or weakness in the arms or legs. Apparently the patient is supposed to ambulate with a walker and has frequent falls due to not using it.      HPI  Past Medical History:  Diagnosis Date  . Allergy   . 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 CAD (coronary artery disease)    08/2017 PCI/DES mRCA, RPDA, CTO pf pLCX with collaterals, normal EF  . Depression    on multiple meds. Has been seen at [facility]. and Dr. Sabra Heck is his prescriber  . Diverticulitis of colon 2000 's   treated as an outpatient  . GERD (gastroesophageal reflux disease)    UGI done August '12 - ulcer/. Dr. Ferdinand Lango, gastroenterologist in [facility].   [name] Hx of adenomatous colonic polyps   . Hyperlipidemia    last lipid panel: HDL 44, LDL 117  . Hypertension   . Myocardial infarction ([place])   . Peptic ulcer    in the past and just recently-August '12  . Pulmonary emboli [facility])    noted October 2014 - treated by Dr. Linda Hedges  . skin cancer    skin CA  . Sleep apnea, primary central    wears CPAP    Patient Active Problem  List   Diagnosis Date Noted  . Vascular dementia ([place]) 07/03/2018  . ARF (acute renal failure) ([place]) 04/23/2018  . Hypokalemia 04/23/2018  . Anorexia 04/23/2018  . Leucocytosis 04/23/2018  . EKG abnormalities 04/23/2018  . B12 deficiency 04/12/2018  . Memory loss 11/28/2017  . Balance problem 11/28/2017  . skin cancer   . Sleep apnea, primary central   . GERD (gastroesophageal reflux disease)   . MDD (major depressive disorder), single episode, moderate ([place])   . CAD S/P percutaneous coronary angioplasty 09/02/2017  . Chronic diastolic (congestive) heart failure ([place]) 08/30/2017  . Elevated blood sugar 01/17/2017  . Incontinence of feces   . Allergic rhinitis 10/15/2013  . Chronic venous insufficiency 07/20/2013  . History of pulmonary embolism 07/08/2013  . ED (erectile dysfunction) 06/01/2011  . Arthritis   . Hypertension   . Hyperlipidemia   . Obstructive sleep apnea     Past Surgical History:  Procedure Laterality Date  . ANAL RECTAL MANOMETRY N/A 11/30/2016   Procedure: ANO RECTAL MANOMETRY;  Surgeon: Mauri Pole, MD;  Location: [facility] ENDOSCOPY;  Service: Endoscopy;  Laterality: N/A;  . CARDIAC CATHETERIZATION    . CHOLECYSTECTOMY  1990   laproscopic   . CORONARY  STENT INTERVENTION N/A 09/01/2017   Procedure: CORONARY STENT INTERVENTION;  Surgeon: Jettie Booze, MD;  Location: [facility] CV LAB;  Service: Cardiovascular;  Laterality: N/A;  . HERNIA REPAIR    . JOINT REPLACEMENT     knee  . PROSTATE SURGERY     needle biopsy's, TURP  . RIGHT/LEFT HEART CATH AND CORONARY ANGIOGRAPHY N/A 09/01/2017   Procedure: RIGHT/LEFT HEART CATH AND CORONARY ANGIOGRAPHY;  Surgeon: Jettie Booze, MD;  Location: [facility] CV LAB;  Service: Cardiovascular;  Laterality: N/A;  . TKR bilateral  2006   Dr. Violet Baldy  . UPPER GASTROINTESTINAL ENDOSCOPY    . VASECTOMY          Home Medications    Prior to Admission medications   Medication Sig Start Date  End Date Taking? Authorizing Provider  aspirin EC 81 MG EC tablet Take 1 tablet (81 mg total) by mouth daily. 09/05/17   Cheryln Manly, NP  atorvastatin (LIPITOR) 80 MG tablet Take 1 tablet (80 mg total) by mouth daily at 6 PM. OFFICE VISIT NEEDED 02/12/19   Lorretta Harp, MD  buPROPion (WELLBUTRIN XL) 300 MG 24 hr tablet TAKE 1 TABLET BY MOUTH ONCE DAILY 06/11/18   Hoyt Koch, MD  clopidogrel (PLAVIX) 75 MG tablet Take 1 tablet (75 mg total) by mouth daily. Patient will be scheduled for AUGUST 2020. 12/07/18   Lorretta Harp, MD  escitalopram [place]) 10 MG tablet Take 1 tablet by mouth once daily 11/01/18   Hoyt Koch, MD  loratadine (CLARITIN) 10 MG tablet Take 10 mg by mouth daily as needed for allergies.     [provider]  Melatonin 1 MG/ML LIQD Take 2.5 mg by mouth at bedtime as needed (sleep).     [provider]  memantine (NAMENDA XR) 7 MG CP24 24 hr capsule TAKE 1  BY MOUTH ONCE DAILY 02/11/19   Hoyt Koch, MD  nitroGLYCERIN (NITROSTAT) 0.4 MG SL tablet Place 1 tablet (0.4 mg total) under the tongue every 5 (five) minutes as needed. 09/04/17   Cheryln Manly, NP  pantoprazole (PROTONIX) 40 MG tablet Take 1 tablet (40 mg total) by mouth daily. Patient will be scheduled for AUGUST 2020. 12/07/18   Lorretta Harp, MD  UNABLE TO FIND Take by mouth. Med Name: penicillin course for 7 days    [provider]    Family History Family History  Problem Relation Age of Onset  . Stroke Mother   . Hypertension Mother   . Heart disease Mother   . Heart disease Father        CAD/MI-fatal sudden death  . Colon cancer Father   . Colon cancer Sister        colon cancer 20090-survivor  . Heart disease Paternal Uncle   . Colon cancer Paternal Uncle   . Heart disease Paternal Grandfather   . Colon cancer Paternal Aunt   . Colon cancer Cousin   . Colon cancer Cousin   . Colon cancer Paternal Uncle   . Stomach cancer Neg Hx   .  Rectal cancer Neg Hx   . Esophageal cancer Neg Hx     Social History Social History   Tobacco Use  . Smoking status: Never Smoker  . Smokeless tobacco: Never Used  Substance Use Topics  . Alcohol use: Yes    Alcohol/week: 1.0 standard drinks    Types: 1 Standard drinks or equivalent per week    Comment: social  .  Drug use: No     Allergies   Cephalexin and Levofloxacin   Review of Systems Review of Systems  Constitutional: Negative for fever.  Respiratory: Negative for shortness of breath.   Cardiovascular: Negative for chest pain.  Gastrointestinal: Negative for abdominal pain, nausea and vomiting.  Musculoskeletal: Positive for arthralgias and myalgias. Negative for back pain.  Neurological: Positive for headaches. Negative for dizziness, syncope and weakness.  Hematological: Bruises/bleeds easily.  All other systems reviewed and are negative.    Physical Exam Updated Vital Signs BP 136/70 (BP Location: Right Arm)   Pulse 69   Temp 99 F (37.2 C) (Oral)   Resp (!) 21   Ht 6' (1.829 m)   Wt 100.7 kg   SpO2 97%   BMI 30.11 kg/m   Physical Exam Vitals signs and nursing note reviewed.  Constitutional:      General: He is not in acute distress.    Appearance: Normal appearance. He is well-developed. He is not ill-appearing.     Comments: Calm, cooperative. Pt in C-collar. Pt gives appropriate history but is somewhat slowed  HENT:     Head: Normocephalic and atraumatic.     Comments: No visible signs of trauma Eyes:     General: No scleral icterus.       Right eye: No discharge.        Left eye: No discharge.     Conjunctiva/sclera: Conjunctivae normal.     Pupils: Pupils are equal, round, and reactive to light.  Neck:     Musculoskeletal: Normal range of motion.  Cardiovascular:     Rate and Rhythm: Normal rate and regular rhythm.  Pulmonary:     Effort: Pulmonary effort is normal. No respiratory distress.     Breath sounds: Normal breath sounds.   Abdominal:     General: There is no distension.     Palpations: Abdomen is soft.     Tenderness: There is no abdominal tenderness.  Musculoskeletal:     Comments: Bilateral knee replacements. Minimal tenderness over the left knee and hip. Pain with ROM of the knee and hip  Skin:    General: Skin is warm and dry.  Neurological:     Mental Status: He is alert and oriented to person, place, and time.  Psychiatric:        Behavior: Behavior normal.      ED Treatments / Results  Labs (all labs ordered are listed, but only abnormal results are displayed) Labs Reviewed  CBG MONITORING, ED - Abnormal; Notable for the following components:      Result Value   Glucose-Capillary 103 (*)    All other components within normal limits    EKG EKG Interpretation  Date/Time:  Thursday February 21 2019 17:10:38 EDT Ventricular Rate:  69 PR Interval:    QRS Duration: 116 QT Interval:  470 QTC Calculation: 504 R Axis:   -31 Text Interpretation:  Age not entered, assumed to be  74 years old for purpose of ECG interpretation Sinus rhythm Nonspecific intraventricular conduction delay Borderline T abnormalities, anterior leads nonspecific t flattening compared with prior 8/19 Confirmed by Aletta Edouard [phone]) on 02/21/2019 5:19:49 PM   Radiology No results found.  Procedures Procedures (including critical care time)  Medications Ordered in ED Medications  acetaminophen (TYLENOL) tablet 650 mg (has no administration in time range)     Initial Impression / Assessment and Plan / ED Course  I have reviewed the triage vital signs and the nursing notes.  Pertinent labs & imaging results that were available during my care of the patient were reviewed by me and considered in my medical decision making (see chart for details).  Clinical Course as of Feb 20 1854  Thu Feb 21, 3388  [id number] 73 year old male with history of dementia here after what sounds like a mechanical fall getting out of his car  witnessed by his family.  He is complaining of some head pain and some left leg pain.  He otherwise appears in no distress.  He is getting CT imaging of his head and neck along with some plain films of his left leg.  Anticipate discharge if work-up negative.   [MB]    Clinical Course User Index [MB] Hayden Rasmussen, MD    73 year old male presents with a fall which sounds mechanical. He is mildy hypertensive but otherwise vitals are normal. There is no evidence of trauma on exam. Due to being on Plavix, a CT head was obtained. He complained of neck pain earlier as well so will add CT C-spine. He also seems to have some L hip and knee pain. Will check xray. EKG is SR. Shared visit with Dr. Melina Copa.  6:55 PM CT and xrays are negative. Pt was able to stand with some assistance to use the urinal at bedside without severe pain. Spoke with the patient's son, [name] regarding results. He is ready to pick up pt.    Final Clinical Impressions(s) / ED Diagnoses   Final diagnoses:  Fall, initial encounter  Injury of head, initial encounter  Acute pain of left knee    ED Discharge Orders    None       Recardo Evangelist, PA-C 02/21/19 1859    Hayden Rasmussen, MD 02/22/19 1048

## 2019-02-25 ENCOUNTER — Other Ambulatory Visit: Payer: Self-pay | Admitting: Cardiovascular Disease

## 2019-02-26 ENCOUNTER — Ambulatory Visit: Payer: Self-pay | Admitting: *Deleted

## 2019-02-27 ENCOUNTER — Ambulatory Visit: Payer: Self-pay | Admitting: *Deleted

## 2019-02-28 ENCOUNTER — Ambulatory Visit: Payer: Self-pay | Admitting: *Deleted

## 2019-03-01 ENCOUNTER — Other Ambulatory Visit: Payer: Self-pay | Admitting: *Deleted

## 2019-03-01 NOTE — Patient Outreach (Signed)
[name]) Care Management  03/01/2019  John Blackburn May 10, 1946 [id number]   CSW attempted to reach pt's family to get an update and was unable to reach but left a HIPPA compliant voice message. CSW will try again in 2-3 weeks.    Eduard Clos, MSW, [place] Worker  [place] [phone]

## 2019-03-03 ENCOUNTER — Inpatient Hospital Stay (HOSPITAL_BASED_OUTPATIENT_CLINIC_OR_DEPARTMENT_OTHER)
Admission: EM | Admit: 2019-03-03 | Discharge: 2019-03-21 | DRG: 640 | Disposition: A | Discharge status: Skilled Nursing Facility | Payer: PPO | Attending: Internal Medicine | Admitting: Internal Medicine

## 2019-03-03 ENCOUNTER — Emergency Department (HOSPITAL_BASED_OUTPATIENT_CLINIC_OR_DEPARTMENT_OTHER): Payer: PPO

## 2019-03-03 ENCOUNTER — Other Ambulatory Visit: Payer: Self-pay

## 2019-03-03 ENCOUNTER — Encounter (HOSPITAL_BASED_OUTPATIENT_CLINIC_OR_DEPARTMENT_OTHER): Payer: Self-pay | Admitting: Emergency Medicine

## 2019-03-03 DIAGNOSIS — Z85828 Personal history of other malignant neoplasm of skin: Secondary | ICD-10-CM

## 2019-03-03 DIAGNOSIS — E669 Obesity, unspecified: Secondary | ICD-10-CM | POA: Diagnosis present

## 2019-03-03 DIAGNOSIS — Z6830 Body mass index (BMI) 30.0-30.9, adult: Secondary | ICD-10-CM | POA: Diagnosis not present

## 2019-03-03 DIAGNOSIS — E876 Hypokalemia: Principal | ICD-10-CM | POA: Diagnosis present

## 2019-03-03 DIAGNOSIS — Z8673 Personal history of transient ischemic attack (TIA), and cerebral infarction without residual deficits: Secondary | ICD-10-CM | POA: Diagnosis not present

## 2019-03-03 DIAGNOSIS — I252 Old myocardial infarction: Secondary | ICD-10-CM

## 2019-03-03 DIAGNOSIS — I5032 Chronic diastolic (congestive) heart failure: Secondary | ICD-10-CM | POA: Diagnosis not present

## 2019-03-03 DIAGNOSIS — Z881 Allergy status to other antibiotic agents status: Secondary | ICD-10-CM

## 2019-03-03 DIAGNOSIS — F0390 Unspecified dementia without behavioral disturbance: Secondary | ICD-10-CM | POA: Diagnosis present

## 2019-03-03 DIAGNOSIS — Z86711 Personal history of pulmonary embolism: Secondary | ICD-10-CM

## 2019-03-03 DIAGNOSIS — I2510 Atherosclerotic heart disease of native coronary artery without angina pectoris: Secondary | ICD-10-CM | POA: Diagnosis not present

## 2019-03-03 DIAGNOSIS — Z1159 Encounter for screening for other viral diseases: Secondary | ICD-10-CM

## 2019-03-03 DIAGNOSIS — R4182 Altered mental status, unspecified: Secondary | ICD-10-CM | POA: Diagnosis not present

## 2019-03-03 DIAGNOSIS — Z8249 Family history of ischemic heart disease and other diseases of the circulatory system: Secondary | ICD-10-CM

## 2019-03-03 DIAGNOSIS — I110 Hypertensive heart disease with heart failure: Secondary | ICD-10-CM | POA: Diagnosis present

## 2019-03-03 DIAGNOSIS — I499 Cardiac arrhythmia, unspecified: Secondary | ICD-10-CM | POA: Diagnosis not present

## 2019-03-03 DIAGNOSIS — R413 Other amnesia: Secondary | ICD-10-CM | POA: Diagnosis present

## 2019-03-03 DIAGNOSIS — E785 Hyperlipidemia, unspecified: Secondary | ICD-10-CM | POA: Diagnosis present

## 2019-03-03 DIAGNOSIS — Z86010 Personal history of colonic polyps: Secondary | ICD-10-CM

## 2019-03-03 DIAGNOSIS — R29818 Other symptoms and signs involving the nervous system: Secondary | ICD-10-CM | POA: Diagnosis not present

## 2019-03-03 DIAGNOSIS — Z955 Presence of coronary angioplasty implant and graft: Secondary | ICD-10-CM

## 2019-03-03 DIAGNOSIS — Z9049 Acquired absence of other specified parts of digestive tract: Secondary | ICD-10-CM

## 2019-03-03 DIAGNOSIS — R059 Cough, unspecified: Secondary | ICD-10-CM

## 2019-03-03 DIAGNOSIS — N179 Acute kidney failure, unspecified: Secondary | ICD-10-CM | POA: Diagnosis not present

## 2019-03-03 DIAGNOSIS — Z823 Family history of stroke: Secondary | ICD-10-CM

## 2019-03-03 DIAGNOSIS — F329 Major depressive disorder, single episode, unspecified: Secondary | ICD-10-CM | POA: Diagnosis present

## 2019-03-03 DIAGNOSIS — R531 Weakness: Secondary | ICD-10-CM

## 2019-03-03 DIAGNOSIS — J690 Pneumonitis due to inhalation of food and vomit: Secondary | ICD-10-CM | POA: Diagnosis not present

## 2019-03-03 DIAGNOSIS — R1310 Dysphagia, unspecified: Secondary | ICD-10-CM | POA: Diagnosis present

## 2019-03-03 DIAGNOSIS — I10 Essential (primary) hypertension: Secondary | ICD-10-CM | POA: Diagnosis present

## 2019-03-03 DIAGNOSIS — F419 Anxiety disorder, unspecified: Secondary | ICD-10-CM | POA: Diagnosis not present

## 2019-03-03 DIAGNOSIS — Z7401 Bed confinement status: Secondary | ICD-10-CM | POA: Diagnosis not present

## 2019-03-03 DIAGNOSIS — Z79890 Hormone replacement therapy (postmenopausal): Secondary | ICD-10-CM

## 2019-03-03 DIAGNOSIS — R270 Ataxia, unspecified: Secondary | ICD-10-CM | POA: Diagnosis present

## 2019-03-03 DIAGNOSIS — R4020 Unspecified coma: Secondary | ICD-10-CM | POA: Diagnosis not present

## 2019-03-03 DIAGNOSIS — Z20828 Contact with and (suspected) exposure to other viral communicable diseases: Secondary | ICD-10-CM | POA: Diagnosis not present

## 2019-03-03 DIAGNOSIS — K219 Gastro-esophageal reflux disease without esophagitis: Secondary | ICD-10-CM | POA: Diagnosis present

## 2019-03-03 DIAGNOSIS — R05 Cough: Secondary | ICD-10-CM

## 2019-03-03 DIAGNOSIS — R509 Fever, unspecified: Secondary | ICD-10-CM

## 2019-03-03 DIAGNOSIS — Z7902 Long term (current) use of antithrombotics/antiplatelets: Secondary | ICD-10-CM

## 2019-03-03 DIAGNOSIS — N390 Urinary tract infection, site not specified: Secondary | ICD-10-CM | POA: Diagnosis not present

## 2019-03-03 DIAGNOSIS — Z79899 Other long term (current) drug therapy: Secondary | ICD-10-CM

## 2019-03-03 DIAGNOSIS — M2550 Pain in unspecified joint: Secondary | ICD-10-CM | POA: Diagnosis not present

## 2019-03-03 DIAGNOSIS — Z800 Family history of malignant neoplasm of digestive organs: Secondary | ICD-10-CM

## 2019-03-03 DIAGNOSIS — G4733 Obstructive sleep apnea (adult) (pediatric): Secondary | ICD-10-CM | POA: Diagnosis not present

## 2019-03-03 DIAGNOSIS — R410 Disorientation, unspecified: Secondary | ICD-10-CM | POA: Diagnosis not present

## 2019-03-03 DIAGNOSIS — Z7982 Long term (current) use of aspirin: Secondary | ICD-10-CM

## 2019-03-03 DIAGNOSIS — R0902 Hypoxemia: Secondary | ICD-10-CM | POA: Diagnosis not present

## 2019-03-03 LAB — COMPREHENSIVE METABOLIC PANEL
ALT: 20 U/L (ref 0–44)
ALT: 15 U/L (ref 0–44)
AST: 28 U/L (ref 15–41)
AST: 20 U/L (ref 15–41)
Albumin: 3.3 g/dL — ABNORMAL LOW (ref 3.5–5.0)
Albumin: 2.8 g/dL — ABNORMAL LOW (ref 3.5–5.0)
Alkaline Phosphatase: 70 U/L (ref 38–126)
Alkaline Phosphatase: 61 U/L (ref 38–126)
Anion gap: 10 (ref 5–15)
Anion gap: 8 (ref 5–15)
BUN: 23 mg/dL (ref 8–23)
BUN: 21 mg/dL (ref 8–23)
CO2: 28 mmol/L (ref 22–32)
CO2: 25 mmol/L (ref 22–32)
Calcium: 8.9 mg/dL (ref 8.9–10.3)
Calcium: 7.4 mg/dL — ABNORMAL LOW (ref 8.9–10.3)
Chloride: 98 mmol/L (ref 98–111)
Chloride: 105 mmol/L (ref 98–111)
Creatinine, Ser: 0.99 mg/dL (ref 0.61–1.24)
Creatinine, Ser: 0.82 mg/dL (ref 0.61–1.24)
GFR calc Af Amer: >60 mL/min (ref >60)
GFR calc Af Amer: >60 mL/min (ref >60)
GFR calc non Af Amer: >60 mL/min (ref >60)
GFR calc non Af Amer: >60 mL/min (ref >60)
Glucose, Bld: 177 mg/dL — ABNORMAL HIGH (ref 70–99)
Glucose, Bld: 161 mg/dL — ABNORMAL HIGH (ref 70–99)
Potassium: 3.3 mmol/L — ABNORMAL LOW (ref 3.5–5.1)
Potassium: 2.5 mmol/L — CL (ref 3.5–5.1)
Sodium: 136 mmol/L (ref 135–145)
Sodium: 138 mmol/L (ref 135–145)
Total Bilirubin: 1.3 mg/dL — ABNORMAL HIGH (ref 0.3–1.2)
Total Bilirubin: 0.8 mg/dL (ref 0.3–1.2)
Total Protein: 7.1 g/dL (ref 6.5–8.1)
Total Protein: 5.7 g/dL — ABNORMAL LOW (ref 6.5–8.1)

## 2019-03-03 LAB — TROPONIN I (HIGH SENSITIVITY): Troponin I (High Sensitivity): 12 ng/L (ref <18)

## 2019-03-03 LAB — URINALYSIS, ROUTINE W REFLEX MICROSCOPIC
Bilirubin Urine: NEGATIVE
Glucose, UA: NEGATIVE mg/dL
Ketones, ur: NEGATIVE mg/dL
Leukocytes,Ua: NEGATIVE
Nitrite: NEGATIVE
Protein, ur: 30 mg/dL — AB
Specific Gravity, Urine: 1.025 (ref 1.005–1.030)
pH: 6 (ref 5.0–8.0)

## 2019-03-03 LAB — CBC WITH DIFFERENTIAL/PLATELET
Abs Immature Granulocytes: 0.03 10*3/uL (ref 0.00–0.07)
Basophils Absolute: 0.1 10*3/uL (ref 0.0–0.1)
Basophils Relative: 1 %
Eosinophils Absolute: 0.3 10*3/uL (ref 0.0–0.5)
Eosinophils Relative: 3 %
HCT: 44.9 % (ref 39.0–52.0)
Hemoglobin: 15 g/dL (ref 13.0–17.0)
Immature Granulocytes: 0 %
Lymphocytes Relative: 13 %
Lymphs Abs: 1.2 10*3/uL (ref 0.7–4.0)
MCH: 33.1 pg (ref 26.0–34.0)
MCHC: 33.4 g/dL (ref 30.0–36.0)
MCV: 99.1 fL (ref 80.0–100.0)
Monocytes Absolute: 0.7 10*3/uL (ref 0.1–1.0)
Monocytes Relative: 7 %
Neutro Abs: 7.7 10*3/uL (ref 1.7–7.7)
Neutrophils Relative %: 76 %
Platelets: 248 10*3/uL (ref 150–400)
RBC: 4.53 MIL/uL (ref 4.22–5.81)
RDW: 13.3 % (ref 11.5–15.5)
WBC: 10 10*3/uL (ref 4.0–10.5)
nRBC: 0 % (ref 0.0–0.2)

## 2019-03-03 LAB — SARS CORONAVIRUS 2 AG (30 MIN TAT): SARS Coronavirus 2 Ag: NEGATIVE

## 2019-03-03 IMAGING — DX PORTABLE CHEST - 1 VIEW
1 series · 1 of 1 positions shown · non-contrast
Comparison: [DATE]

CLINICAL DATA: Cough

EXAM:
PORTABLE CHEST 1 VIEW

[chest ap]
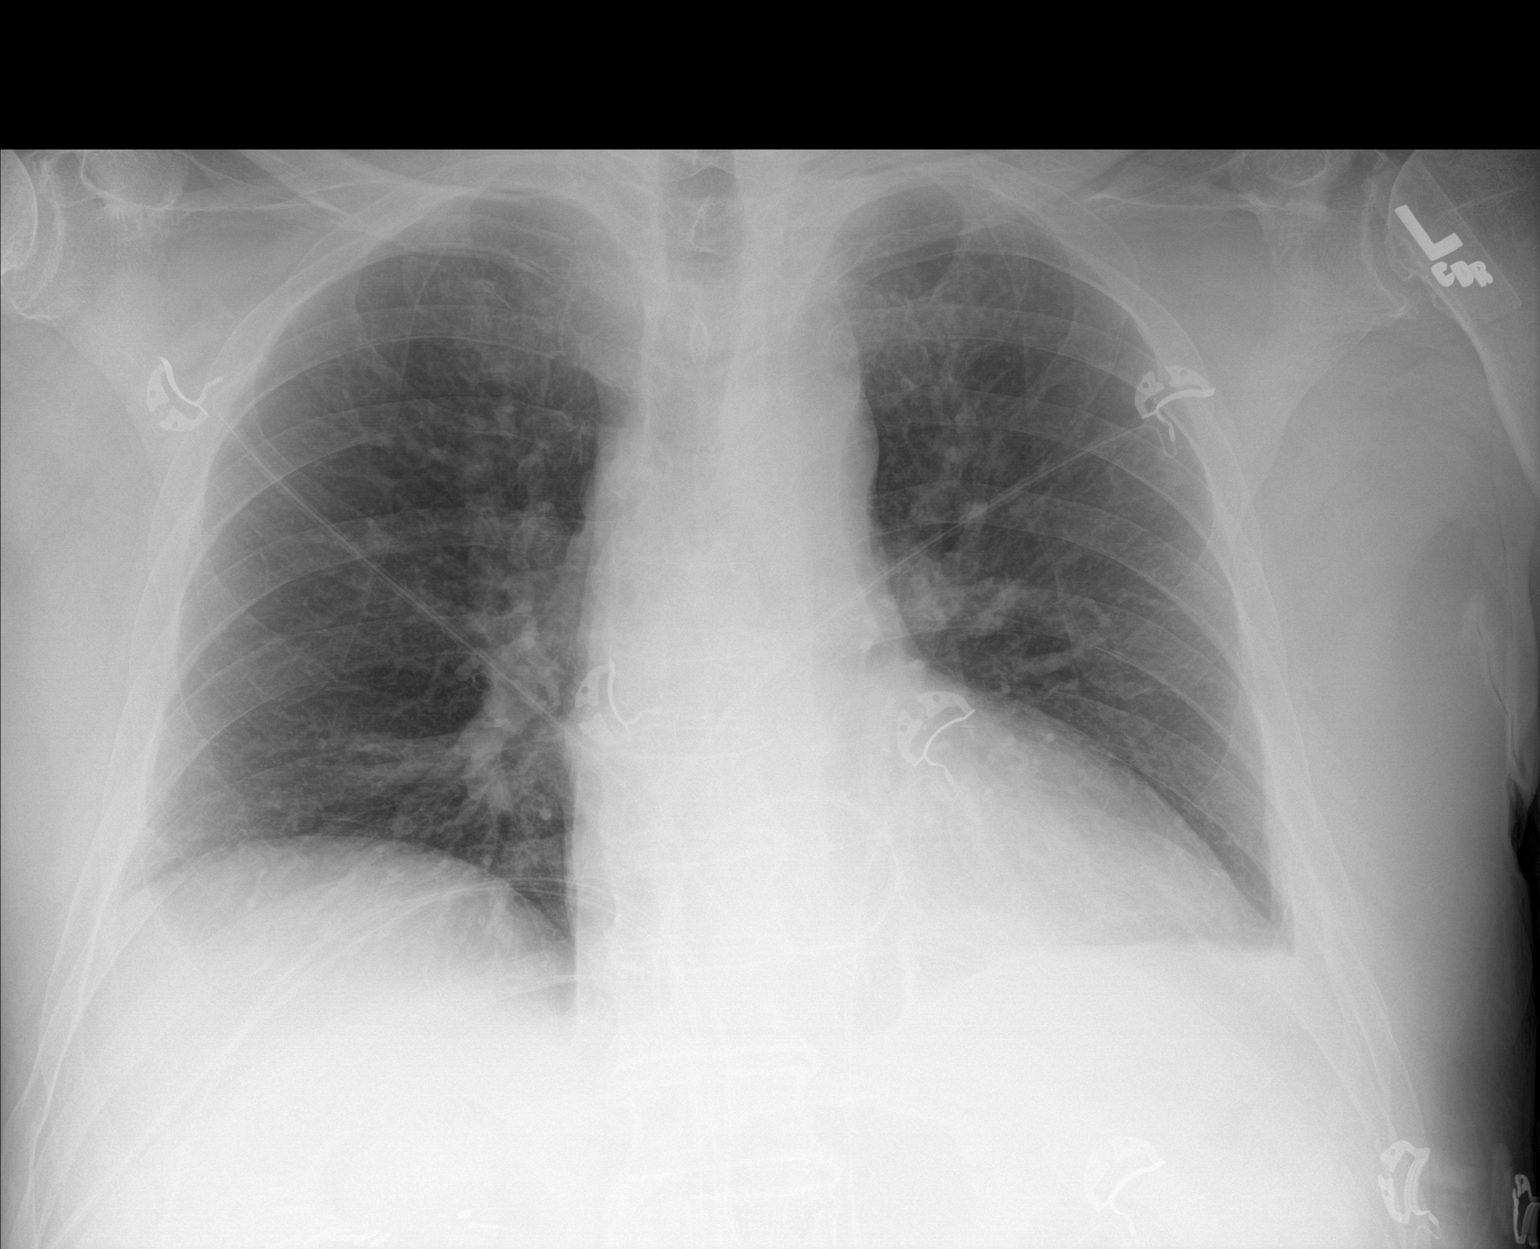

[1 of 1 positions shown; findings below may reference images not displayed]

FINDINGS: Heart is borderline in size. No confluent airspace opacities.
Blunting of the left costophrenic angle may reflect small left
effusion or pleural thickening.
IMPRESSION: Small left effusion versus pleural thickening.

## 2019-03-03 IMAGING — CT CT HEAD WITHOUT CONTRAST
3 series · 16 of 47 positions shown, 19 images · non-contrast
Comparison: [DATE]

CLINICAL DATA: Altered level of consciousness, history coronary
artery disease, hypertension

EXAM:
CT HEAD WITHOUT CONTRAST
TECHNIQUE: Contiguous axial images were obtained from the base of the skull
through the vertex without intravenous contrast. Sagittal and
coronal MPR images reconstructed from axial data set.

[Series 2: head wo · axial · 0.46mm/px · z∈[-166,-16]mm · 10 of 36 slices shown, 13 images]
[im 3/36  brain]
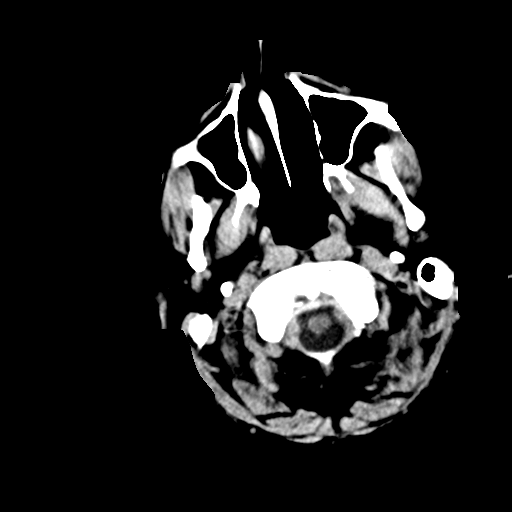
[im 3/36  bone]
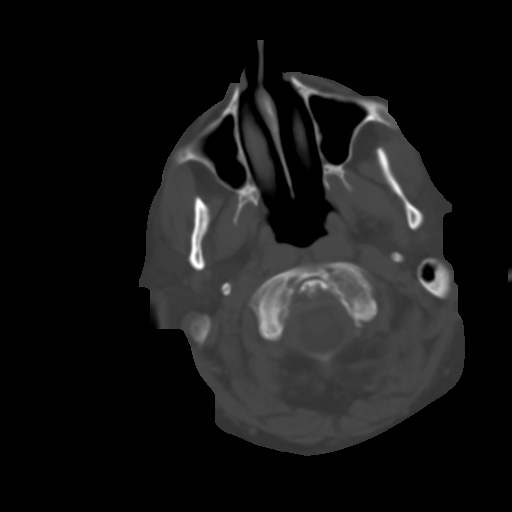
[im 7/36  brain]
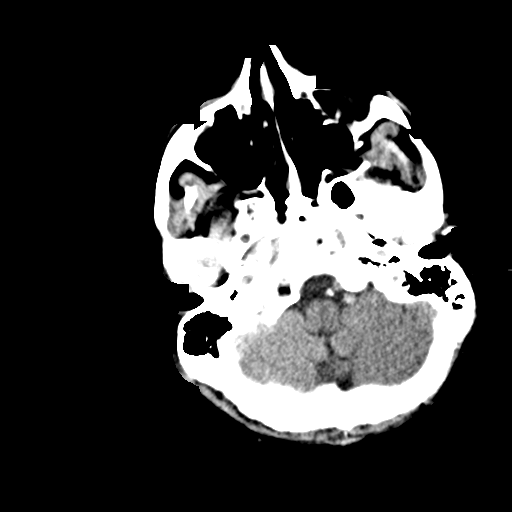
[im 10/36  brain]
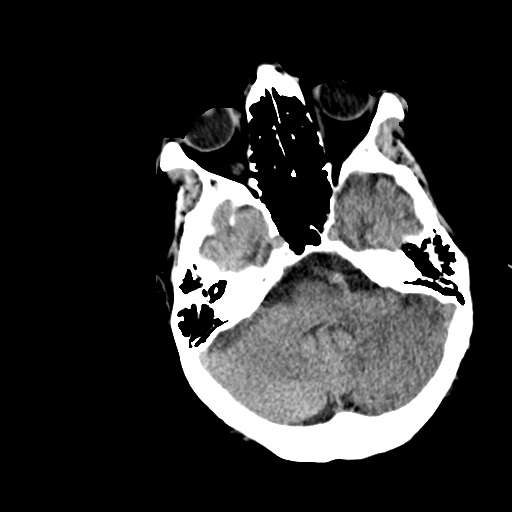
[im 13/36  brain]
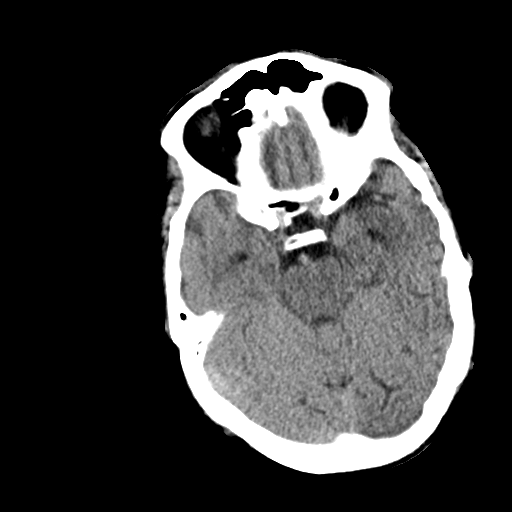
[im 16/36  brain]
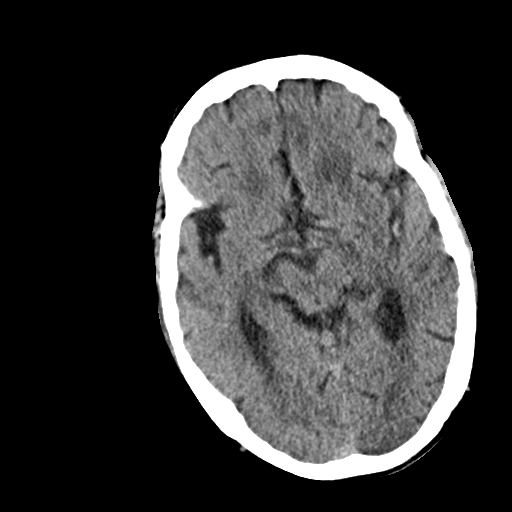
[im 16/36  bone]
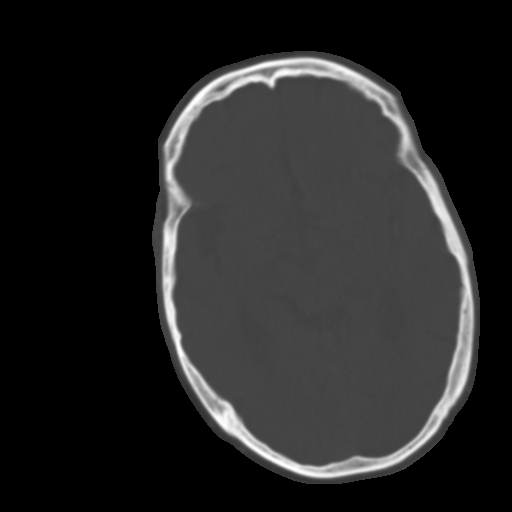
[im 20/36  brain]
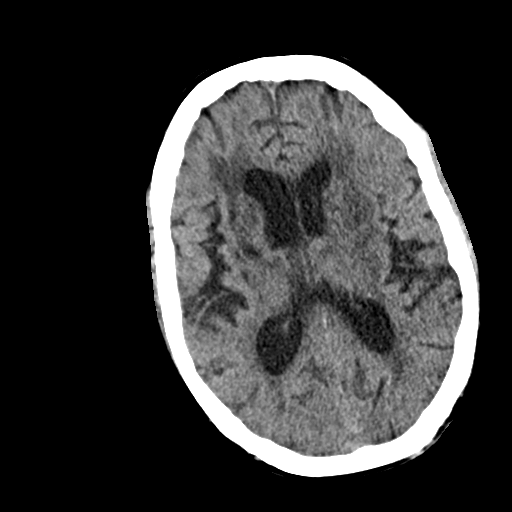
[im 23/36  brain]
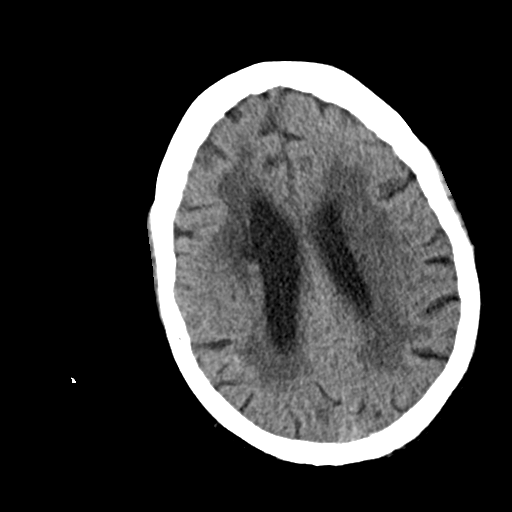
[im 27/36  brain]
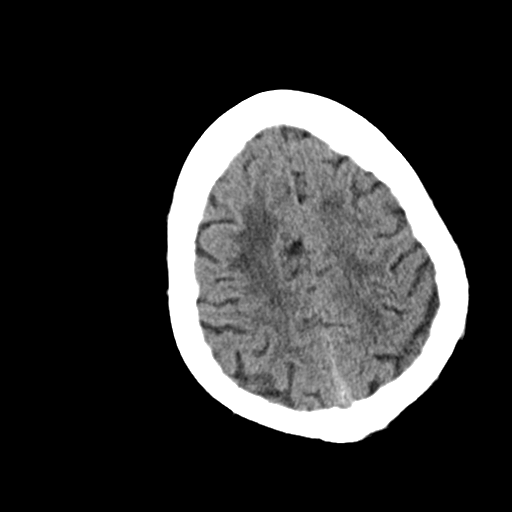
[im 29/36  brain]
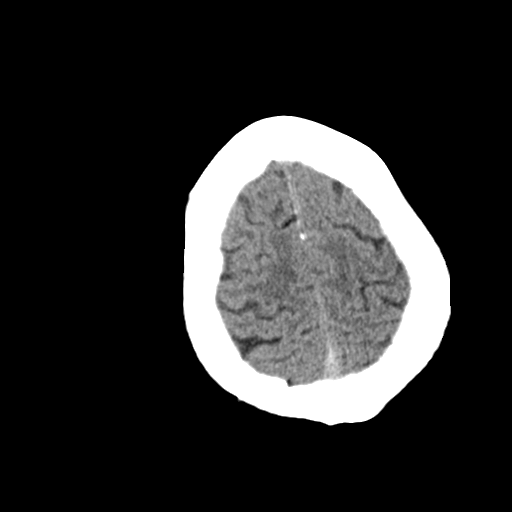
[im 29/36  bone]
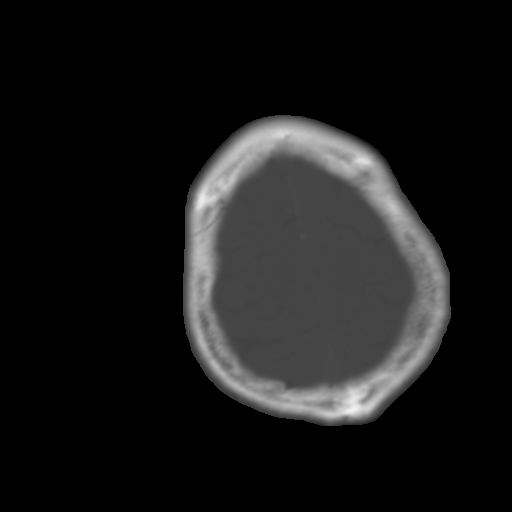
[im 33/36  brain]
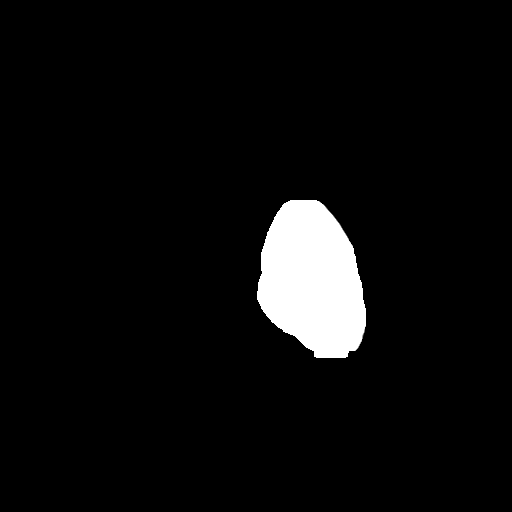

[Series 4: coronal soft · coronal · 0.35mm/px · 3 of 71 slices shown]
[im 24/71  brain]
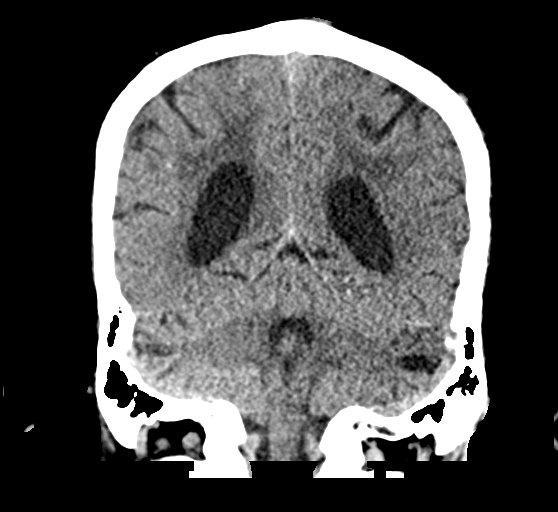
[im 32/71  brain]
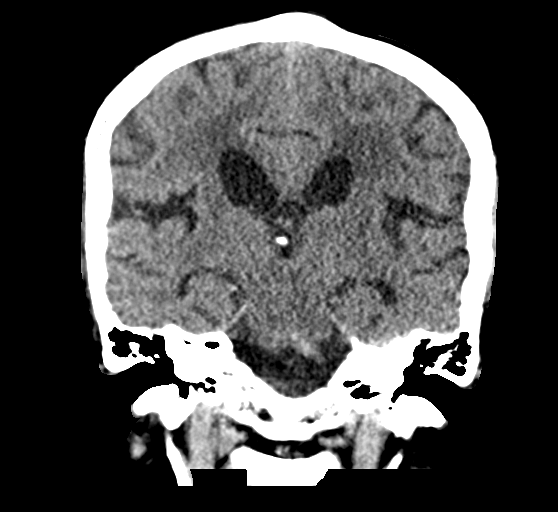
[im 39/71  brain]
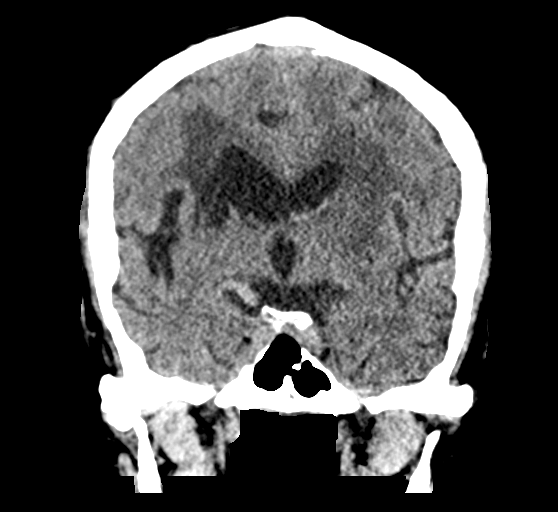

[Series 5: sag soft · sagittal · 0.35mm/px · 3 of 56 slices shown]
[im 19/56  brain]
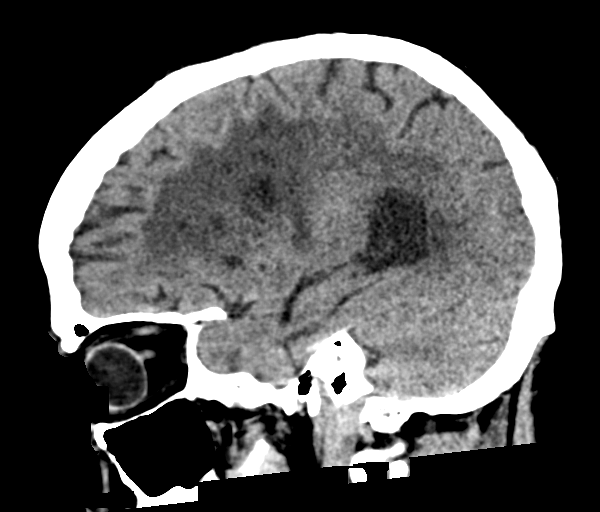
[im 28/56  brain]
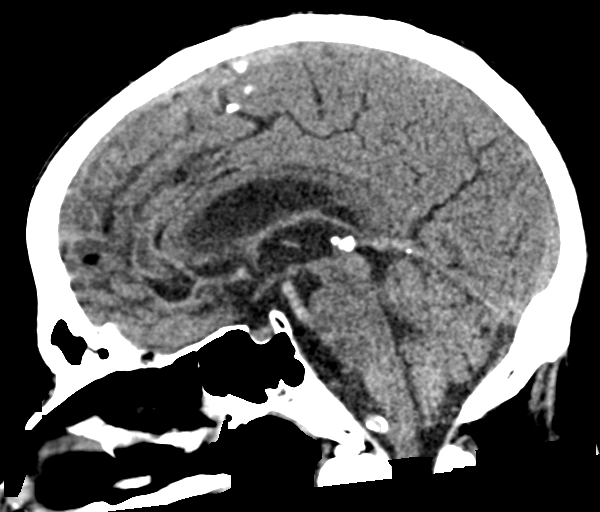
[im 37/56  brain]
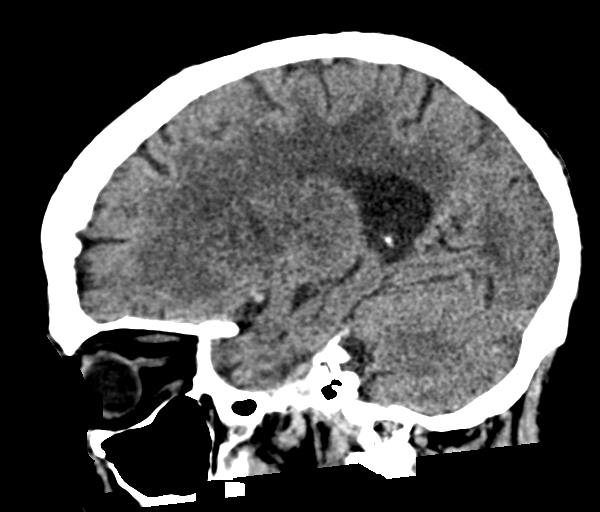

[16 of 47 positions shown; findings below may reference images not displayed]

FINDINGS: Brain: Generalized atrophy. Normal ventricular morphology. No
midline shift or mass effect. Small vessel chronic ischemic changes
of deep cerebral white matter. Old lacunar infarcts at the basal
ganglia and LEFT thalamus with extension into RIGHT periventricular
white matter. No intracranial hemorrhage or mass lesion. Subtle area
of new low-attenuation at the LEFT occipital lobe suspicious for
acute infarct. No extra-axial fluid collections.

Vascular: Mild atherosclerotic calcifications within vertebral and
carotid arteries at skull base.

Skull: Grossly intact, scattered motion artifacts noted

Sinuses/Orbits: Clear

Other: N/A
IMPRESSION: Atrophy with small vessel chronic ischemic changes of deep cerebral
white matter.

Multiple old lacunar infarcts.

New area of subtle low attenuation in the LEFT occipital lobe
suspicious for acute infarct.

No intracranial hemorrhage.

## 2019-03-03 MED ORDER — POTASSIUM CHLORIDE 10 MEQ/100ML IV SOLN
10.0000 meq | INTRAVENOUS | Status: DC
  Administered 2019-03-04 (×3): 10 meq via INTRAVENOUS
  Filled 2019-03-03 (×3): qty 100

## 2019-03-03 NOTE — ED Triage Notes (Signed)
Brought by ems from home for c/o increased urinary frequency with foul odor and dark color per son.

## 2019-03-03 NOTE — ED Notes (Signed)
Imaging after labs collected, RN@bedside 

## 2019-03-03 NOTE — ED Notes (Signed)
Redrew light green tube at this time, previous specimen hemolyzed.

## 2019-03-03 NOTE — ED Provider Notes (Signed)
[facility] Provider Note   CSN: [id number] Arrival date & time: 03/03/19  2152     History   Chief Complaint Chief Complaint  Patient presents with  . Urinary Frequency    HPI John Blackburn is a 73 y.o. male.     Patient is a 73 year old male with a history of CAD, hypertension, dementia, CHF who is presenting today with generalized weakness and decreased mobility.  Patient's son gives most of the history but states patient is still currently living independently at his home with an aide that comes in for 3 hours a day.  This week patient has been going downhill per his son.  Last week patient did have a fall and hit his head and was evaluated at [facility] with a negative head CT.  He since returned home and was doing okay but Tuesday was having more difficulty ambulating.  They called paramedics who evaluated the patient and felt that he was doing okay so he remained at home.  Since that time patient has continued to decline and since Friday has not been able to get up at all except with significant assistance to get him from a bed to a chair and then he sits in the chair all day until they can get him back in bed.  He has had decreased appetite but no vomiting.  Son has noticed over the last few days he has had increased urinary frequency and foul-smelling urine.  He is also had a mild cough but no significant shortness of breath and unknown if he has had a fever.  There have been no known sick contacts and no recent medication changes.  Family is currently working to get the patient into a memory care unit but is still filling out paperwork.  The history is provided by the patient and a relative.  Urinary Frequency    Past Medical History:  Diagnosis Date  . Allergy   . 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 CAD (coronary artery disease)    08/2017 PCI/DES mRCA, RPDA, CTO pf pLCX with collaterals, normal EF  . Depression    on  multiple meds. Has been seen at [facility]. and Dr. Sabra Heck is his prescriber  . Diverticulitis of colon 2000 's   treated as an outpatient  . GERD (gastroesophageal reflux disease)    UGI done August '12 - ulcer/. Dr. Ferdinand Lango, gastroenterologist in [facility].   [name] Hx of adenomatous colonic polyps   . Hyperlipidemia    last lipid panel: HDL 44, LDL 117  . Hypertension   . Myocardial infarction ([place])   . Peptic ulcer    in the past and just recently-August '12  . Pulmonary emboli [facility])    noted October 2014 - treated by Dr. Linda Hedges  . skin cancer    skin CA  . Sleep apnea, primary central    wears CPAP    Patient Active Problem List   Diagnosis Date Noted  . Vascular dementia ([place]) 07/03/2018  . ARF (acute renal failure) ([employer]) 04/23/2018  . Hypokalemia 04/23/2018  . Anorexia 04/23/2018  . Leucocytosis 04/23/2018  . EKG abnormalities 04/23/2018  . B12 deficiency 04/12/2018  . Memory loss 11/28/2017  . Balance problem 11/28/2017  . skin cancer   . Sleep apnea, primary central   . GERD (gastroesophageal reflux disease)   . MDD (major depressive disorder), single episode, moderate ([place])   . CAD S/P percutaneous coronary  angioplasty 09/02/2017  . Chronic diastolic (congestive) heart failure ([place]) 08/30/2017  . Elevated blood sugar 01/17/2017  . Incontinence of feces   . Allergic rhinitis 10/15/2013  . Chronic venous insufficiency 07/20/2013  . History of pulmonary embolism 07/08/2013  . ED (erectile dysfunction) 06/01/2011  . Arthritis   . Hypertension   . Hyperlipidemia   . Obstructive sleep apnea     Past Surgical History:  Procedure Laterality Date  . ANAL RECTAL MANOMETRY N/A 11/30/2016   Procedure: ANO RECTAL MANOMETRY;  Surgeon: Mauri Pole, MD;  Location: [facility];  Service: Endoscopy;  Laterality: N/A;  . CARDIAC CATHETERIZATION    . CHOLECYSTECTOMY  1990   laproscopic   . CORONARY STENT INTERVENTION N/A 09/01/2017   Procedure: CORONARY STENT  INTERVENTION;  Surgeon: Jettie Booze, MD;  Location: [facility];  Service: Cardiovascular;  Laterality: N/A;  . HERNIA REPAIR    . JOINT REPLACEMENT     knee  . PROSTATE SURGERY     needle biopsy's, TURP  . RIGHT/LEFT HEART CATH AND CORONARY ANGIOGRAPHY N/A 09/01/2017   Procedure: RIGHT/LEFT HEART CATH AND CORONARY ANGIOGRAPHY;  Surgeon: Jettie Booze, MD;  Location: [facility];  Service: Cardiovascular;  Laterality: N/A;  . TKR bilateral  2006   Dr. Violet Baldy  . UPPER GASTROINTESTINAL ENDOSCOPY    . VASECTOMY          Home Medications    Prior to Admission medications   Medication Sig Start Date End Date Taking? Authorizing Provider  aspirin EC 81 MG EC tablet Take 1 tablet (81 mg total) by mouth daily. 09/05/17   Cheryln Manly, NP  atorvastatin (LIPITOR) 80 MG tablet Take 1 tablet (80 mg total) by mouth daily at 6 PM. KEEP OV. 02/25/19   Lorretta Harp, MD  buPROPion (WELLBUTRIN XL) 300 MG 24 hr tablet TAKE 1 TABLET BY MOUTH ONCE DAILY 06/11/18   Hoyt Koch, MD  clopidogrel (PLAVIX) 75 MG tablet Take 1 tablet (75 mg total) by mouth daily. Patient will be scheduled for AUGUST 2020. 12/07/18   Lorretta Harp, MD  escitalopram [place]) 10 MG tablet Take 1 tablet by mouth once daily 11/01/18   Hoyt Koch, MD  loratadine (CLARITIN) 10 MG tablet Take 10 mg by mouth daily as needed for allergies.     [provider]  Melatonin 1 MG/ML LIQD Take 2.5 mg by mouth at bedtime as needed (sleep).     [provider]  memantine (NAMENDA XR) 7 MG CP24 24 hr capsule TAKE 1  BY MOUTH ONCE DAILY 02/11/19   Hoyt Koch, MD  nitroGLYCERIN (NITROSTAT) 0.4 MG SL tablet Place 1 tablet (0.4 mg total) under the tongue every 5 (five) minutes as needed. 09/04/17   Cheryln Manly, NP  pantoprazole (PROTONIX) 40 MG tablet Take 1 tablet (40 mg total) by mouth daily. Patient will be scheduled for AUGUST 2020. 12/07/18   Lorretta Harp, MD  UNABLE TO FIND Take by mouth. Med Name: penicillin course for 7 days    [provider]    Family History Family History  Problem Relation Age of Onset  . Stroke Mother   . Hypertension Mother   . Heart disease Mother   . Heart disease Father        CAD/MI-fatal sudden death  . Colon cancer Father   . Colon cancer Sister        colon cancer [id number]-survivor  .  Heart disease Paternal Uncle   . Colon cancer Paternal Uncle   . Heart disease Paternal Grandfather   . Colon cancer Paternal Aunt   . Colon cancer Cousin   . Colon cancer Cousin   . Colon cancer Paternal Uncle   . Stomach cancer Neg Hx   . Rectal cancer Neg Hx   . Esophageal cancer Neg Hx     Social History Social History   Tobacco Use  . Smoking status: Never Smoker  . Smokeless tobacco: Never Used  Substance Use Topics  . Alcohol use: Yes    Alcohol/week: 1.0 standard drinks    Types: 1 Standard drinks or equivalent per week    Comment: social  . Drug use: No     Allergies   Cephalexin and Levofloxacin   Review of Systems Review of Systems  Genitourinary: Positive for frequency.  All other systems reviewed and are negative.    Physical Exam Updated Vital Signs BP (!) 150/83 (BP Location: Left Arm)   Pulse 85   Temp 99.4 F (37.4 C) (Rectal)   Resp 18   Ht 6' (1.829 m)   Wt 100.7 kg   SpO2 94%   BMI 30.11 kg/m   Physical Exam Vitals signs and nursing note reviewed.  Constitutional:      General: He is not in acute distress.    Appearance: He is well-developed and normal weight.     Comments: Patient is awake and attempts to answer questions but seems confused  HENT:     Head: Normocephalic and atraumatic.  Eyes:     Conjunctiva/sclera: Conjunctivae normal.     Pupils: Pupils are equal, round, and reactive to light.  Neck:     Musculoskeletal: Normal range of motion and neck supple.  Cardiovascular:     Rate and Rhythm: Normal rate and regular rhythm.      Pulses: Normal pulses.     Heart sounds: No murmur.  Pulmonary:     Effort: Pulmonary effort is normal. No respiratory distress.     Breath sounds: Examination of the right-lower field reveals decreased breath sounds. Examination of the left-lower field reveals decreased breath sounds. Decreased breath sounds present. No wheezing or rales.  Abdominal:     General: There is no distension.     Palpations: Abdomen is soft.     Tenderness: There is no abdominal tenderness. There is no guarding or rebound.  Musculoskeletal: Normal range of motion.        General: No tenderness.  Skin:    General: Skin is warm and dry.     Findings: No erythema or rash.  Neurological:     Mental Status: He is alert.     Comments: Oriented to person and place.  Weakness noted in the left upper and lower extremity unclear if this is baseline.  Patient requires significant assistance from 2 people just to sit up in bed  Psychiatric:     Comments: Confused but calm and cooperative      ED Treatments / Results  Labs (all labs ordered are listed, but only abnormal results are displayed) Labs Reviewed  URINE CULTURE  SARS CORONAVIRUS 2 (HOSP ORDER, PERFORMED IN [facility] VIA ABBOTT ID)  CULTURE, BLOOD (ROUTINE X 2)  CULTURE, BLOOD (ROUTINE X 2)  CBC WITH DIFFERENTIAL/PLATELET  COMPREHENSIVE METABOLIC PANEL  URINALYSIS, ROUTINE W REFLEX MICROSCOPIC  TROPONIN I (HIGH SENSITIVITY)  TROPONIN I (HIGH SENSITIVITY)    EKG    Radiology No results  found.  Procedures Procedures (including critical care time)  Medications Ordered in ED Medications - No data to display   Initial Impression / Assessment and Plan / ED Course  I have reviewed the triage vital signs and the nursing notes.  Pertinent labs & imaging results that were available during my care of the patient were reviewed by me and considered in my medical decision making (see chart for details).       Elderly male presenting from  home with worsening generalized weakness and now he is not even able to get out of bed.  Patient currently is still living independently with only an aide for 3 hours during the day.  Family reports until this week he was up ambulating in his house with a walker and now requires 2 people just to help him sit up.  Concern for infectious etiology versus new stroke or head bleed.  Patient is awake and alert and cooperative.  He does have left-sided deficits however suspect this is most likely old because he has had a stroke in the past.  Labs, ekg and imaging pending.  Final Clinical Impressions(s) / ED Diagnoses   Final diagnoses:  None    ED Discharge Orders    None       Blanchie Dessert, MD 03/03/19 2308

## 2019-03-03 NOTE — ED Notes (Signed)
Patient transported to CT 

## 2019-03-04 ENCOUNTER — Inpatient Hospital Stay (HOSPITAL_COMMUNITY): Payer: PPO

## 2019-03-04 DIAGNOSIS — Z9049 Acquired absence of other specified parts of digestive tract: Secondary | ICD-10-CM | POA: Diagnosis not present

## 2019-03-04 DIAGNOSIS — I2510 Atherosclerotic heart disease of native coronary artery without angina pectoris: Secondary | ICD-10-CM | POA: Diagnosis present

## 2019-03-04 DIAGNOSIS — R531 Weakness: Secondary | ICD-10-CM

## 2019-03-04 DIAGNOSIS — N390 Urinary tract infection, site not specified: Secondary | ICD-10-CM | POA: Diagnosis not present

## 2019-03-04 DIAGNOSIS — Z8673 Personal history of transient ischemic attack (TIA), and cerebral infarction without residual deficits: Secondary | ICD-10-CM | POA: Diagnosis not present

## 2019-03-04 DIAGNOSIS — N179 Acute kidney failure, unspecified: Secondary | ICD-10-CM | POA: Diagnosis not present

## 2019-03-04 DIAGNOSIS — Z823 Family history of stroke: Secondary | ICD-10-CM | POA: Diagnosis not present

## 2019-03-04 DIAGNOSIS — F419 Anxiety disorder, unspecified: Secondary | ICD-10-CM | POA: Diagnosis present

## 2019-03-04 DIAGNOSIS — I5032 Chronic diastolic (congestive) heart failure: Secondary | ICD-10-CM | POA: Diagnosis present

## 2019-03-04 DIAGNOSIS — K219 Gastro-esophageal reflux disease without esophagitis: Secondary | ICD-10-CM

## 2019-03-04 DIAGNOSIS — Z881 Allergy status to other antibiotic agents status: Secondary | ICD-10-CM | POA: Diagnosis not present

## 2019-03-04 DIAGNOSIS — I252 Old myocardial infarction: Secondary | ICD-10-CM | POA: Diagnosis not present

## 2019-03-04 DIAGNOSIS — Z1159 Encounter for screening for other viral diseases: Secondary | ICD-10-CM | POA: Diagnosis not present

## 2019-03-04 DIAGNOSIS — E669 Obesity, unspecified: Secondary | ICD-10-CM | POA: Diagnosis present

## 2019-03-04 DIAGNOSIS — J690 Pneumonitis due to inhalation of food and vomit: Secondary | ICD-10-CM | POA: Diagnosis not present

## 2019-03-04 DIAGNOSIS — G4733 Obstructive sleep apnea (adult) (pediatric): Secondary | ICD-10-CM | POA: Diagnosis present

## 2019-03-04 DIAGNOSIS — Z86010 Personal history of colonic polyps: Secondary | ICD-10-CM | POA: Diagnosis not present

## 2019-03-04 DIAGNOSIS — E876 Hypokalemia: Principal | ICD-10-CM

## 2019-03-04 DIAGNOSIS — Z85828 Personal history of other malignant neoplasm of skin: Secondary | ICD-10-CM | POA: Diagnosis not present

## 2019-03-04 DIAGNOSIS — R413 Other amnesia: Secondary | ICD-10-CM

## 2019-03-04 DIAGNOSIS — I110 Hypertensive heart disease with heart failure: Secondary | ICD-10-CM | POA: Diagnosis present

## 2019-03-04 DIAGNOSIS — F329 Major depressive disorder, single episode, unspecified: Secondary | ICD-10-CM | POA: Diagnosis present

## 2019-03-04 DIAGNOSIS — Z955 Presence of coronary angioplasty implant and graft: Secondary | ICD-10-CM | POA: Diagnosis not present

## 2019-03-04 DIAGNOSIS — Z86711 Personal history of pulmonary embolism: Secondary | ICD-10-CM | POA: Diagnosis not present

## 2019-03-04 DIAGNOSIS — E785 Hyperlipidemia, unspecified: Secondary | ICD-10-CM | POA: Diagnosis present

## 2019-03-04 DIAGNOSIS — Z6830 Body mass index (BMI) 30.0-30.9, adult: Secondary | ICD-10-CM | POA: Diagnosis not present

## 2019-03-04 DIAGNOSIS — I10 Essential (primary) hypertension: Secondary | ICD-10-CM

## 2019-03-04 LAB — CBC
HCT: 42.9 % (ref 39.0–52.0)
Hemoglobin: 14.3 g/dL (ref 13.0–17.0)
MCH: 32.6 pg (ref 26.0–34.0)
MCHC: 33.3 g/dL (ref 30.0–36.0)
MCV: 97.9 fL (ref 80.0–100.0)
Platelets: 239 10*3/uL (ref 150–400)
RBC: 4.38 MIL/uL (ref 4.22–5.81)
RDW: 13.3 % (ref 11.5–15.5)
WBC: 9.7 10*3/uL (ref 4.0–10.5)
nRBC: 0 % (ref 0.0–0.2)

## 2019-03-04 LAB — PROTIME-INR
INR: 1.1 (ref 0.8–1.2)
Prothrombin Time: 13.9 seconds (ref 11.4–15.2)

## 2019-03-04 LAB — BASIC METABOLIC PANEL
Anion gap: 10 (ref 5–15)
Anion gap: 9 (ref 5–15)
BUN: 18 mg/dL (ref 8–23)
BUN: 16 mg/dL (ref 8–23)
CO2: 27 mmol/L (ref 22–32)
CO2: 28 mmol/L (ref 22–32)
Calcium: 8.8 mg/dL — ABNORMAL LOW (ref 8.9–10.3)
Calcium: 8.9 mg/dL (ref 8.9–10.3)
Chloride: 101 mmol/L (ref 98–111)
Chloride: 103 mmol/L (ref 98–111)
Creatinine, Ser: 0.84 mg/dL (ref 0.61–1.24)
Creatinine, Ser: 0.89 mg/dL (ref 0.61–1.24)
GFR calc Af Amer: >60 mL/min (ref >60)
GFR calc Af Amer: >60 mL/min (ref >60)
GFR calc non Af Amer: >60 mL/min (ref >60)
GFR calc non Af Amer: >60 mL/min (ref >60)
Glucose, Bld: 104 mg/dL — ABNORMAL HIGH (ref 70–99)
Glucose, Bld: 150 mg/dL — ABNORMAL HIGH (ref 70–99)
Potassium: 3.3 mmol/L — ABNORMAL LOW (ref 3.5–5.1)
Potassium: 3.8 mmol/L (ref 3.5–5.1)
Sodium: 138 mmol/L (ref 135–145)
Sodium: 140 mmol/L (ref 135–145)

## 2019-03-04 LAB — URINALYSIS, MICROSCOPIC (REFLEX)

## 2019-03-04 LAB — TROPONIN I (HIGH SENSITIVITY): Troponin I (High Sensitivity): 14 ng/L (ref <18)

## 2019-03-04 LAB — MAGNESIUM: Magnesium: 1.9 mg/dL (ref 1.7–2.4)

## 2019-03-04 LAB — SEDIMENTATION RATE: Sed Rate: 31 mm/hr — ABNORMAL HIGH (ref 0–16)

## 2019-03-04 LAB — TSH: TSH: 1.177 u[IU]/mL (ref 0.350–4.500)

## 2019-03-04 LAB — CK: Total CK: 53 U/L (ref 49–397)

## 2019-03-04 LAB — BRAIN NATRIURETIC PEPTIDE: B Natriuretic Peptide: 124 pg/mL — ABNORMAL HIGH (ref 0.0–100.0)

## 2019-03-04 LAB — VITAMIN B12: Vitamin B-12: 286 pg/mL (ref 180–914)

## 2019-03-04 IMAGING — MR MRI HEAD WITHOUT CONTRAST
12 of 13 series · 44 of 48 positions shown · non-contrast
Comparison: Head CT from yesterday.  Brain MRI [DATE]

CLINICAL DATA: Focal neuro deficit

EXAM:
MRI HEAD WITHOUT CONTRAST
TECHNIQUE: Multiplanar, multiecho pulse sequences of the brain and surrounding
structures were obtained without intravenous contrast.

[Series 5: DWI · axial · 3.0mm · 0.88mm/px · z∈[-64,+82]mm · 7 of 100 slices shown (1 of 4)]
[im 1/100]
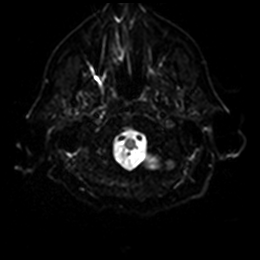
[im 17/100]
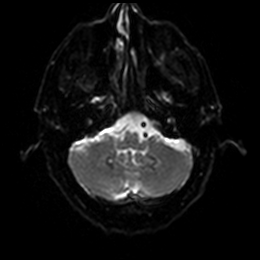
[im 34/100]
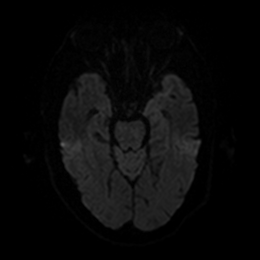
[im 50/100]
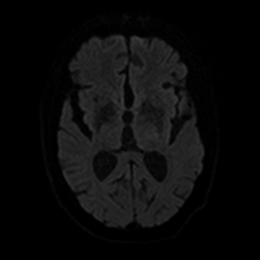
[im 67/100]
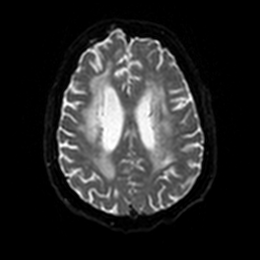
[im 83/100]
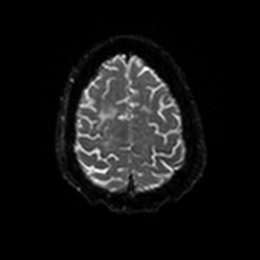
[im 100/100]
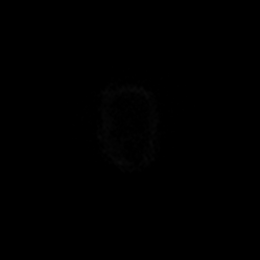

[Series 6: DWI · axial · 3.0mm · 0.88mm/px · z∈[-64,+82]mm · 4 of 50 slices shown (2 of 4)]
[im 1/50]
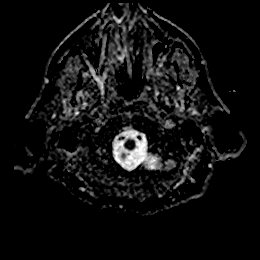
[im 17/50]
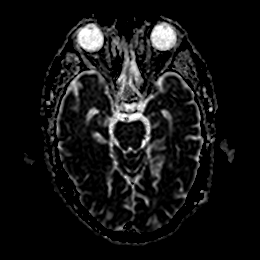
[im 33/50]
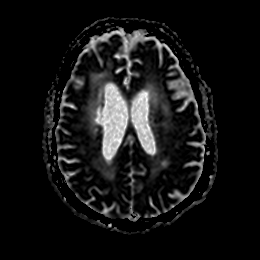
[im 50/50]
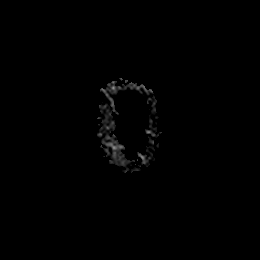

[Series 7: DWI · coronal · 4.0mm · 0.88mm/px · 6 of 74 slices shown (3 of 4)]
[im 1/74]
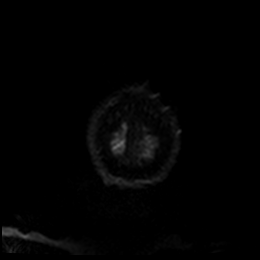
[im 15/74]
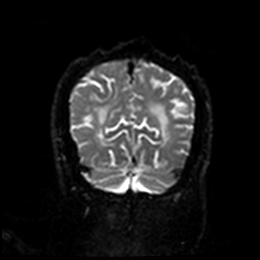
[im 30/74]
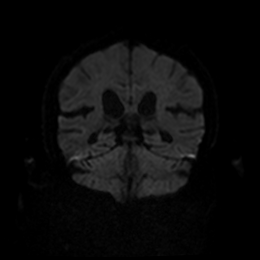
[im 44/74]
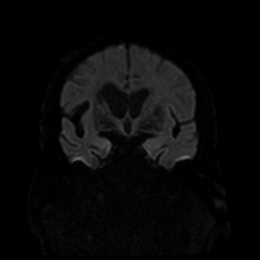
[im 59/74]
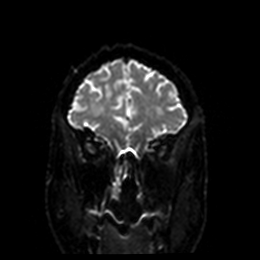
[im 74/74]
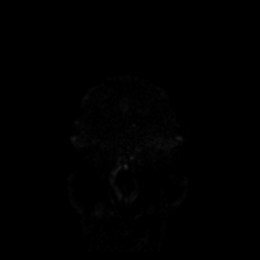

[Series 8: DWI · coronal · 4.0mm · 0.88mm/px · 3 of 37 slices shown (4 of 4)]
[im 1/37]
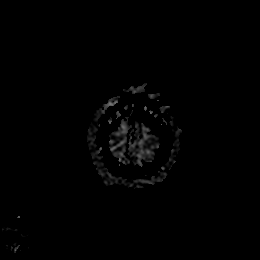
[im 19/37]
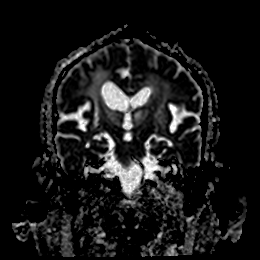
[im 37/37]
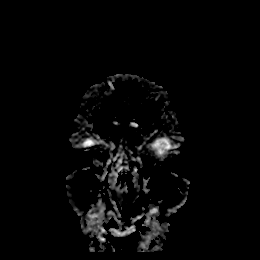

[Series 9: T1 · sagittal · 5.0mm · 0.75mm/px · 2 of 23 slices shown]
[im 1/23]
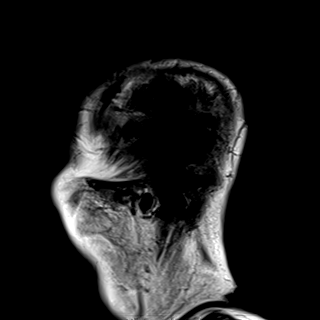
[im 23/23]
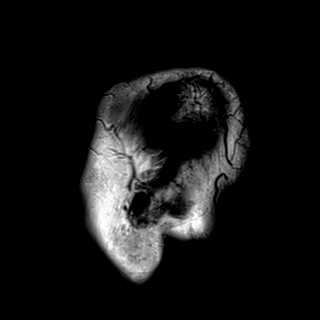

[Series 10: T2 · axial · 5.0mm · 0.72mm/px · z∈[-63,+81]mm · 2 of 23 slices shown (1 of 2)]
[im 1/23]
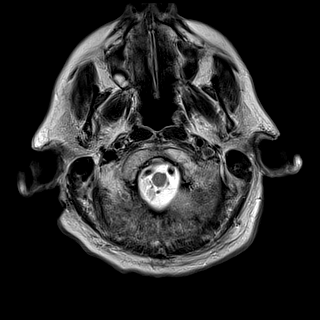
[im 23/23]
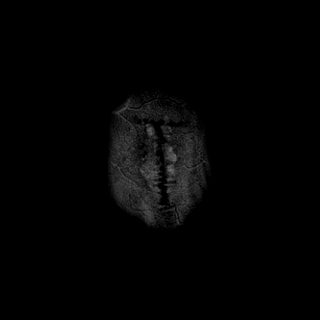

[Series 11: FLAIR · axial · 5.0mm · 0.45mm/px · z∈[-61,+82]mm · 2 of 25 slices shown]
[im 1/25]
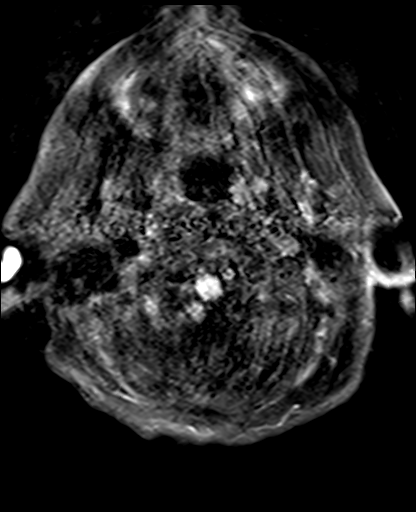
[im 25/25]
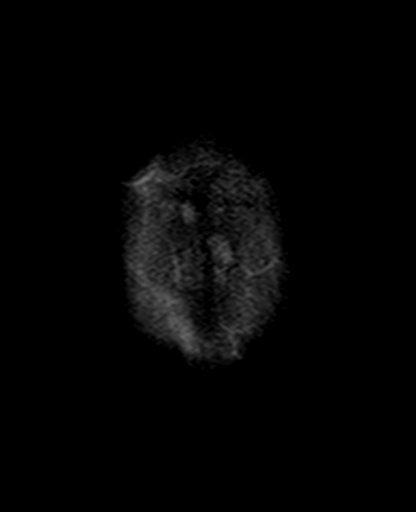

[Series 12: mag_images · axial · 3.0mm · 0.90mm/px · z∈[-72,+93]mm · 4 of 56 slices shown]
[im 1/56]
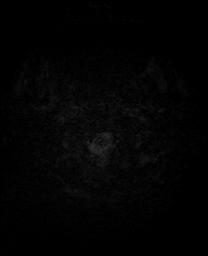
[im 19/56]
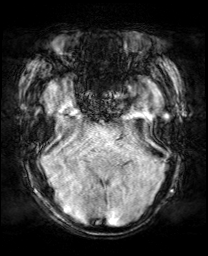
[im 37/56]
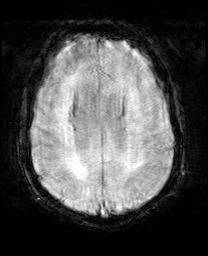
[im 56/56]
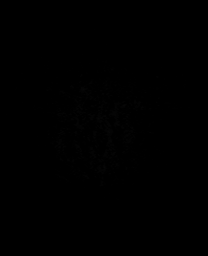

[Series 13: pha_images · axial · 3.0mm · 0.90mm/px · z∈[-72,+78]mm · 4 of 51 slices shown]
[im 1/51]
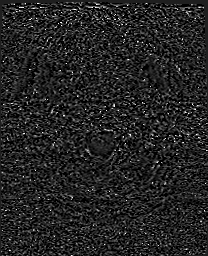
[im 17/51]
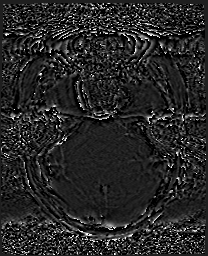
[im 34/51]
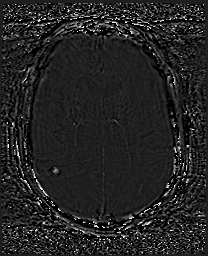
[im 51/51]
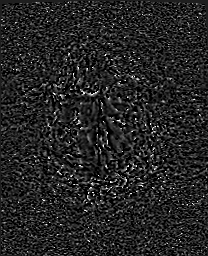

[Series 14: swi_images · axial · 3.0mm · 0.90mm/px · z∈[-72,+93]mm · 4 of 56 slices shown]
[im 1/56]
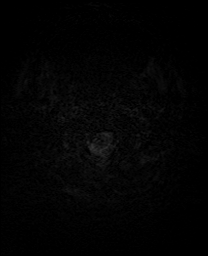
[im 19/56]
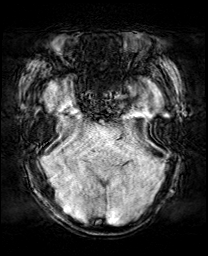
[im 37/56]
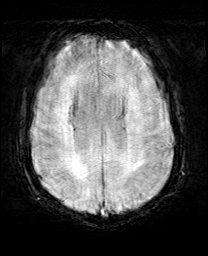
[im 56/56]
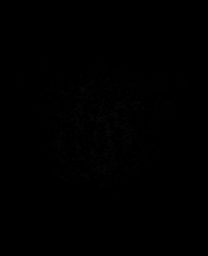

[Series 15: mip_images(sw) · axial · 24.0mm · 0.90mm/px · z∈[-61,+82]mm · 4 of 49 slices shown]
[im 1/49]
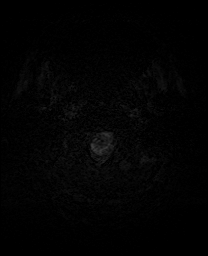
[im 17/49]
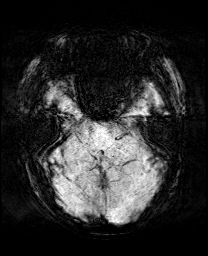
[im 33/49]
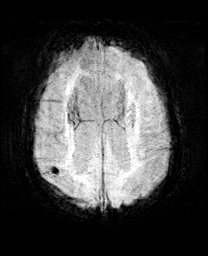
[im 49/49]
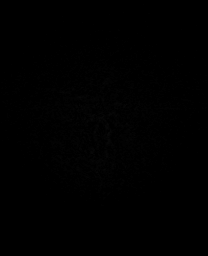

[Series 17: T2 · coronal · 5.0mm · 0.43mm/px · 2 of 31 slices shown (2 of 2)]
[im 1/31]
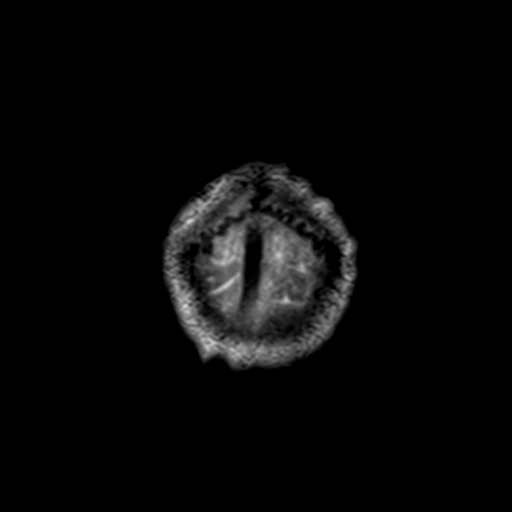
[im 31/31]
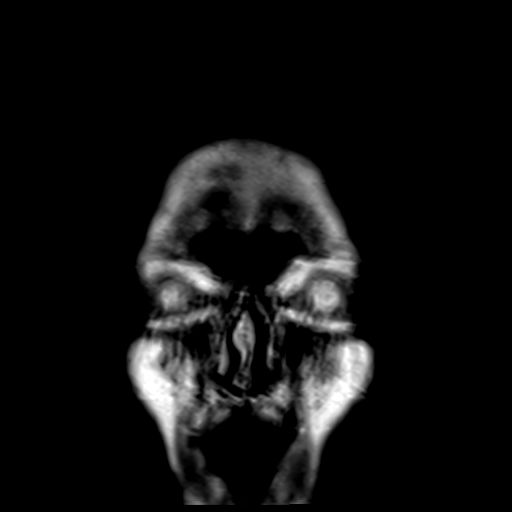

[44 of 48 positions shown; findings below may reference images not displayed]

FINDINGS: Brain: No acute infarction, hemorrhage, hydrocephalus, extra-axial
collection or mass lesion.

Advanced chronic small vessel ischemia with confluent gliosis in the
deep white matter and across the bilateral pons. There are
accentuated dilated perivascular spaces with remote bilateral
thalamic infarcts and a right lateral lenticulostriate basal ganglia
infarct. Chronic blood products in the right parietal lobe. Mild
cerebral volume loss.

Vascular: Major flow voids are preserved.

Skull and upper cervical spine: Negative for marrow lesion

Sinuses/Orbits: Negative
IMPRESSION: 1. No acute finding.
2. Advanced chronic small vessel ischemia.

## 2019-03-04 MED ORDER — ACETAMINOPHEN 325 MG PO TABS: 650.0000 mg | ORAL_TABLET | Freq: Four times a day (QID) | ORAL | Status: DC | PRN

## 2019-03-04 MED ORDER — ATORVASTATIN CALCIUM 80 MG PO TABS: 80.0000 mg | ORAL_TABLET | Freq: Every day | ORAL | Status: DC

## 2019-03-04 MED ORDER — CLOPIDOGREL BISULFATE 75 MG PO TABS
75.0000 mg | ORAL_TABLET | Freq: Every day | ORAL | Status: DC
  Administered 2019-03-04 – 2019-03-21 (×18): 75 mg via ORAL
  Filled 2019-03-04 (×18): qty 1

## 2019-03-04 MED ORDER — PANTOPRAZOLE SODIUM 40 MG PO TBEC
40.0000 mg | DELAYED_RELEASE_TABLET | Freq: Every day | ORAL | Status: DC
  Administered 2019-03-04 – 2019-03-21 (×18): 40 mg via ORAL
  Filled 2019-03-04 (×18): qty 1

## 2019-03-04 MED ORDER — ACETAMINOPHEN 650 MG RE SUPP: 650.0000 mg | Freq: Four times a day (QID) | RECTAL | Status: DC | PRN

## 2019-03-04 MED ORDER — MAGNESIUM SULFATE IN D5W 1-5 GM/100ML-% IV SOLN
1.0000 g | Freq: Once | INTRAVENOUS | Status: AC
  Administered 2019-03-04: 1 g via INTRAVENOUS
  Filled 2019-03-04: qty 100

## 2019-03-04 MED ORDER — STROKE: EARLY STAGES OF RECOVERY BOOK
Freq: Once | Status: AC
  Administered 2019-03-04: 05:00:00
  Filled 2019-03-04: qty 1

## 2019-03-04 MED ORDER — ASPIRIN 81 MG PO CHEW
81.0000 mg | CHEWABLE_TABLET | Freq: Every day | ORAL | Status: DC
  Administered 2019-03-04 – 2019-03-21 (×18): 81 mg via ORAL
  Filled 2019-03-04 (×18): qty 1

## 2019-03-04 MED ORDER — ESCITALOPRAM OXALATE 10 MG PO TABS: 10.0000 mg | ORAL_TABLET | Freq: Every day | ORAL | Status: DC

## 2019-03-04 MED ORDER — BUPROPION HCL ER (XL) 150 MG PO TB24: 300.0000 mg | ORAL_TABLET | Freq: Every day | ORAL | Status: DC

## 2019-03-04 MED ORDER — POTASSIUM CHLORIDE IN NACL 40-0.9 MEQ/L-% IV SOLN
INTRAVENOUS | Status: DC
  Administered 2019-03-04: 125 mL/h via INTRAVENOUS
  Filled 2019-03-04 (×3): qty 1000

## 2019-03-04 MED ORDER — MELATONIN 3 MG PO TABS
3.0000 mg | ORAL_TABLET | Freq: Every evening | ORAL | Status: DC | PRN
  Filled 2019-03-04: qty 1

## 2019-03-04 MED ORDER — POTASSIUM CHLORIDE CRYS ER 20 MEQ PO TBCR
40.0000 meq | EXTENDED_RELEASE_TABLET | ORAL | Status: AC
  Administered 2019-03-04 (×2): 40 meq via ORAL
  Filled 2019-03-04 (×2): qty 2

## 2019-03-04 MED ORDER — NITROGLYCERIN 0.4 MG SL SUBL: 0.4000 mg | SUBLINGUAL_TABLET | SUBLINGUAL | Status: DC | PRN

## 2019-03-04 MED ORDER — LORATADINE 10 MG PO TABS: 10.0000 mg | ORAL_TABLET | Freq: Every day | ORAL | Status: DC | PRN

## 2019-03-04 MED ORDER — MEMANTINE HCL ER 7 MG PO CP24
7.0000 mg | ORAL_CAPSULE | Freq: Every day | ORAL | Status: DC
  Administered 2019-03-04 – 2019-03-21 (×18): 7 mg via ORAL
  Filled 2019-03-04 (×18): qty 1

## 2019-03-04 MED ORDER — HYDRALAZINE HCL 20 MG/ML IJ SOLN: 5.0000 mg | INTRAMUSCULAR | Status: DC | PRN

## 2019-03-04 MED ORDER — ENOXAPARIN SODIUM 40 MG/0.4ML ~~LOC~~ SOLN
40.0000 mg | SUBCUTANEOUS | Status: DC
  Administered 2019-03-04 – 2019-03-21 (×18): 40 mg via SUBCUTANEOUS
  Filled 2019-03-04 (×18): qty 0.4

## 2019-03-04 MED ORDER — POTASSIUM CHLORIDE 10 MEQ/100ML IV SOLN
10.0000 meq | INTRAVENOUS | Status: AC
  Administered 2019-03-04: 10 meq via INTRAVENOUS
  Filled 2019-03-04: qty 100

## 2019-03-04 NOTE — ED Notes (Signed)
Called [facility] - advised that bed was ready  [facility] 3W 15

## 2019-03-04 NOTE — ED Notes (Signed)
Son updated by Dr Betsey Holiday.

## 2019-03-04 NOTE — ED Provider Notes (Signed)
Patient signed out to me by Dr. Maryan Rued to follow work-up.  Patient seen for generalized weakness.  Patient found to be very hypokalemic.  Potassium 2.5 on redraw after first specimen was hemolyzed.  Initiated on IV potassium replacement.  Urinalysis obtained, no evidence of infection at this time.  Remainder of his blood work was essentially unremarkable.  Patient's chest x-ray does not show acute pathology.  Patient CT, however, concerning for possible left cerebellar infarct.  I did go back and reexamined the patient.  From a neurologic standpoint he has extremity ataxia, lower extremity greater than upper.  This is symmetric.  Patient has 3+ out of 5 symmetric lower extremity strength, 4+ out of 5 symmetric upper extremity strength.  This does not represent any obvious focal abnormality.  Unclear if CT findings would explain the patient's current presentation.  Discussed briefly with Dr. Rory Percy, call for neurology.  Specifically my question to him was can the patient be admitted to the hospital and have a nonurgent MRI during his hospitalization for work-up.  Timing of the symptoms is unclear, patient has reportedly been weak and unable to get out of bed for several days.  There is nothing on exam that would represent a large vessel occlusion.  Dr. Rory Percy did confirm that the patient did not need to be transferred to the ED at this time to have emergent MRI.  He asks that neurology be consulted if work-up reveals evidence of a stroke.  Patient will be [facility] to [facility] for further work-up.   Orpah Greek, MD 03/04/19 0005

## 2019-03-04 NOTE — Plan of Care (Addendum)
73 year old male lives alone admitted with profound generalized weakness of unclear etiology.  Upon discussion with his son [name] reports that 18 June they took him out for [employer] Day and when he got out of the car he fell and hit his head he came to the ER the CT of his head did not find anything acute and was discharged home.  Ever since he has been declining fast with decreased p.o. intake decreased appetite and generalized weakness.  He started walking less and less in the house and few days prior to admission to hospital patient has been having very difficulty getting up from the chair to bed and bed to the chair without assistance.  Work-up so far UA unremarkable chest x-ray no acute changes MRI of the brain advanced chronic small vessel ischemia with no acute stroke but evidence of old strokes seen normal TSH normal white count.  I have added B12 folate sed rate CPK.  I am holding his Lipitor and getting PT evaluation.  His K was very low at 2.5 which was replaced with low mag which was also replaced will recheck later today.  Patient very lethargic with minimal p.o. intake I will also start him on IV fluids.  Patient most likely will need SNF consulted case management and social worker.  Patient needs continuous ongoing hospital stay for further work-up to be completed as well as his electrolytes to be back to normal.  PT eval pending.

## 2019-03-04 NOTE — TOC Initial Note (Signed)
Transition of Care [facility]) - Initial/Assessment Note    Patient Details  Name: John Blackburn MRN: [id number] Date of Birth: [birth year]  Transition of Care [facility]) CM/SW Contact:    Pollie Friar, RN Phone Number: 03/04/2019, 1:47 PM  Clinical Narrative:                 Pt denies issues with medications at home. He states that son can assist some at home. Pt states he still drives.    Barriers to Discharge: Continued Medical Work up   Patient Goals and CMS Choice        Expected Discharge Plan and Services         Living arrangements for the past 2 months: Apartment(ground level)                                      Prior Living Arrangements/Services Living arrangements for the past 2 months: Apartment(ground level) Lives with:: Self Patient language and need for interpreter reviewed:: Yes(no needs) Do you feel safe going back to the place where you live?: Yes          Current home services: DME(cane, walker, shower seat) Criminal Activity/Legal Involvement Pertinent to Current Situation/Hospitalization: No - Comment as needed  Activities of Daily Living      Permission Sought/Granted                  Emotional Assessment Appearance:: Appears older than stated age Attitude/Demeanor/Rapport: Engaged Affect (typically observed): Accepting, Pleasant Orientation: : Oriented to Self, Oriented to Place   Psych Involvement: No (comment)  Admission diagnosis:  Hypokalemia [E87.6] Generalized weakness [R53.1] Patient Active Problem List   Diagnosis Date Noted  . Weakness generalized 03/04/2019  . CAD (coronary artery disease) 03/04/2019  . Generalized weakness 03/04/2019  . Vascular dementia ([place]) 07/03/2018  . ARF (acute renal failure) ([place]) 04/23/2018  . Hypokalemia 04/23/2018  . Anorexia 04/23/2018  . Leucocytosis 04/23/2018  . EKG abnormalities 04/23/2018  . B12 deficiency 04/12/2018  . Memory loss 11/28/2017  . Balance problem  11/28/2017  . skin cancer   . Sleep apnea, primary central   . GERD (gastroesophageal reflux disease)   . MDD (major depressive disorder), single episode, moderate ([place])   . CAD S/P percutaneous coronary angioplasty 09/02/2017  . Chronic diastolic (congestive) heart failure ([place]) 08/30/2017  . Elevated blood sugar 01/17/2017  . Incontinence of feces   . Allergic rhinitis 10/15/2013  . Chronic venous insufficiency 07/20/2013  . History of pulmonary embolism 07/08/2013  . ED (erectile dysfunction) 06/01/2011  . Arthritis   . Hypertension   . Hyperlipidemia   . Obstructive sleep apnea    PCP:  Hoyt Koch, MD Pharmacy:   [place], [place] - [address] [address] Phone: [phone] Fax: [phone]     Social Determinants of Health (SDOH) Interventions    Readmission Risk Interventions No flowsheet data found.

## 2019-03-04 NOTE — Evaluation (Signed)
Occupational Therapy Evaluation Patient Details Name: John Blackburn MRN: [id number] DOB: September 11, 1945 Today's Date: 03/04/2019    History of Present Illness Patient is a 73 year old male. Per notes sons report that over the past 2 weeks the patient has had increasing weakness. He is confused today. 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 PMH: PE, sleep apnea, skin cancer, MI, depression, GERD, HTN, allergies, OA, bilateral knee replacements    Clinical Impression   This 73 yo male admitted with above presents to acute OT with decreased sitting and standing balance, decreased safety awareness, slow to respond to questions and actions all affecting his safety and independence with basic ADLs. He will continue to benefit from acute OT with follow up OT at SNF.    Follow Up Recommendations  SNF;Supervision/Assistance - 24 hour    Equipment Recommendations  (TBD next venue)       Precautions / Restrictions Precautions Precautions: Fall Restrictions Weight Bearing Restrictions: No      Mobility Bed Mobility Overal bed mobility: Needs Assistance Bed Mobility: Supine to Sit;Sit to Supine     Supine to sit: Max assist Sit to supine: Mod assist    Transfers Overall transfer level: Needs assistance Equipment used: Rolling walker (2 wheeled) Transfers: Sit to/from Stand Sit to Stand: Mod assist         General transfer comment: mod a to stand for stability     Balance Overall balance assessment: Needs assistance Sitting-balance support: Bilateral upper extremity supported;Feet supported Sitting balance-Leahy Scale: Poor Sitting balance - Comments: posterior lean   Standing balance support: Bilateral upper extremity supported Standing balance-Leahy Scale: Poor                           ADL either performed or assessed with clinical judgement   ADL Overall ADL's : Needs assistance/impaired Eating/Feeding: Set up;Supervision/ safety Eating/Feeding Details  (indicate cue type and reason): supported sitting Grooming: Minimal assistance Grooming Details (indicate cue type and reason): supported sitting Upper Body Bathing: Minimal assistance Upper Body Bathing Details (indicate cue type and reason): supported sitting Lower Body Bathing: Maximal assistance Lower Body Bathing Details (indicate cue type and reason): Mod A sit<>stand and to maintain standing Upper Body Dressing : Moderate assistance Upper Body Dressing Details (indicate cue type and reason): supported sitting Lower Body Dressing: Total assistance Lower Body Dressing Details (indicate cue type and reason): Mod A sit<>stand and to maintain standing                     Vision Patient Visual Report: No change from baseline Additional Comments: Pt can track in all directions but has great difficulty in keeping head still and just moving eyes            Pertinent Vitals/Pain Pain Assessment: No/denies pain     Hand Dominance Right   Extremity/Trunk Assessment Upper Extremity Assessment Upper Extremity Assessment: Generalized weakness     Communication Communication Communication: No difficulties   Cognition Arousal/Alertness: Awake/alert Behavior During Therapy: Flat affect Overall Cognitive Status: No family/caregiver present to determine baseline cognitive functioning Area of Impairment: Orientation;Memory;Attention;Following commands;Safety/judgement;Problem solving                 Orientation Level: Disoriented to;Situation;Time(july, but could not correct when given cue that it is the month before) Current Attention Level: Sustained Memory: Decreased short-term memory Following Commands: Follows one step commands inconsistently Safety/Judgement: Decreased awareness of safety;Decreased awareness of deficits  Problem Solving: Slow processing;Decreased initiation;Difficulty sequencing;Requires verbal cues;Requires tactile cues    Prior  Functioning/Environment Level of Independence: Needs assistance  Gait / Transfers Assistance Needed: over the past few weeks he has required increased assistance to transfer  ADL's / Homemaking Assistance Needed: prior to decline was able to do most of his own B/D/toileting, per chart it does have an aid 3 hours a day   Comments: Most information comes from ntoes. Patient can answer some simple questions but appears confused         OT Problem List: Decreased strength;Decreased range of motion;Impaired balance (sitting and/or standing)      OT Treatment/Interventions: Self-care/ADL training;DME and/or AE instruction;Balance training;Therapeutic activities    OT Goals(Current goals can be found in the care plan section) Acute Rehab OT Goals Patient Stated Goal: when asked at end of session he kept drifting off to sleep OT Goal Formulation: Patient unable to participate in goal setting Time For Goal Achievement: 03/18/19 Potential to Achieve Goals: Fair  OT Frequency: Min 2X/week   Barriers to D/C: Decreased caregiver support             AM-PAC OT "6 Clicks" Daily Activity     Outcome Measure Help from another person eating meals?: A Little Help from another person taking care of personal grooming?: A Little Help from another person toileting, which includes using toliet, bedpan, or urinal?: A Lot Help from another person bathing (including washing, rinsing, drying)?: A Lot Help from another person to put on and taking off regular upper body clothing?: A Lot Help from another person to put on and taking off regular lower body clothing?: Total 6 Click Score: 13   End of Session Equipment Utilized During Treatment: Gait belt;Rolling walker Nurse Communication: Mobility status(did not get pt up due no chair alarms currently available)  Activity Tolerance: Patient tolerated treatment well Patient left: in bed;with bed alarm set  OT Visit Diagnosis: Unsteadiness on feet  (R26.81);Other abnormalities of gait and mobility (R26.89);Muscle weakness (generalized) (M62.81);Other symptoms and signs involving cognitive function                Time: 1034-1106 OT Time Calculation (min): 32 min Charges:  OT General Charges $OT Visit: 1 Visit OT Evaluation $OT Eval Moderate Complexity: 1 Mod OT Treatments $Self Care/Home Management : 8-22 mins  Golden Circle, OTR/L Acute [employer] Pager [phone] Office [phone]      Almon Register 03/04/2019, 3:38 PM

## 2019-03-04 NOTE — H&P (Signed)
History and Physical    John Blackburn:[id number] DOB: Mar 15, 1946 DOA: 03/03/2019  Referring MD/NP/PA:   PCP: Hoyt Koch, MD   Patient coming from:  The patient is coming from home.  At baseline, pt is partially dependent for most of ADL.        Chief Complaint: Generalized weakness  HPI: John Blackburn is a 73 y.o. male with medical history significant of hypertension, hyperlipidemia, GERD, depression, CAD, stent placement, PE 2014 (not on anticoagulants currently), OSA on CPAP, dCHF, who presents with generalized weakness.  Per report, pt's son reported to EDP that patient has been declining recently.  Patient has generalized weakness, decreased mobility, decreased appetite. Last week, patient did have a fall and hit his head and was evaluated at [facility] with a negative head CT. Pt is so week that he has difficulty moving from chair to bed without help.  Patient denies any chest pain, shortness breath.  He has mild dry cough.  No fever or chills. Has nausea, but no vomiting, diarrhea or abdominal pain.  Patient has increased urinary frequency per his son's report.  Patient denies unilateral numbness or tingling in his extremities. No facial droop or slurred speech. Family is currently working to get the patient into a memory care unit but is still filling out paperwork.  ED Course: pt was found to have negative troponin x2, negative urinalysis, negative COVID-19 test, potassium 2.5, renal function normal, temperature 99.2, blood pressure 156/101, heart rate 73, oxygen saturation 93 to 97% on room air.  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 Pt is admitted to telemetry bed as inpatient.  ED physician discussed with neurologist, Dr. Rory Percy, who recommended MRI for brain.  Review of Systems:   General: no fevers, chills, no body weight gain, has poor appetite, has fatigue  HEENT: no blurry vision, hearing changes or sore throat Respiratory: no dyspnea, coughing, wheezing CV: no chest pain, no palpitations GI: has nausea, no vomiting, abdominal pain, diarrhea, constipation GU: no dysuria, burning on urination, increased urinary frequency, hematuria  Ext: no leg edema Neuro: No vision change or hearing loss. Has generalized weakness Skin: no rash, no skin tear. MSK: No muscle spasm, no deformity, no limitation of range of movement in spin Heme: No easy bruising.  Travel history: No recent long distant travel.  Allergy:  Allergies  Allergen Reactions  . Cephalexin Rash  . Levofloxacin Rash    Past Medical History:  Diagnosis Date  . Allergy   . 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 CAD (coronary artery disease)    08/2017 PCI/DES mRCA, RPDA, CTO pf pLCX with collaterals, normal EF  . Depression    on multiple meds. Has been seen at [facility]. and Dr. Sabra Heck is his prescriber  . Diverticulitis of colon 2000 's   treated as an outpatient  . GERD (gastroesophageal reflux disease)    UGI done August '12 - ulcer/. Dr. Ferdinand Lango, gastroenterologist in [facility].   [name] Hx of adenomatous colonic polyps   . Hyperlipidemia    last lipid panel: HDL 44, LDL 117  . Hypertension   . Myocardial infarction ([place])   . Peptic ulcer    in the past and just recently-August '12  . Pulmonary emboli [facility])    noted October 2014 - treated by Dr. Linda Hedges  . skin cancer  skin CA  . Sleep apnea, primary central    wears CPAP    Past Surgical History:  Procedure Laterality Date  . ANAL RECTAL MANOMETRY N/A 11/30/2016   Procedure: ANO RECTAL MANOMETRY;  Surgeon: Mauri Pole, MD;  Location: [facility];  Service: Endoscopy;  Laterality: N/A;  . CARDIAC CATHETERIZATION    . CHOLECYSTECTOMY  1990   laproscopic   . CORONARY STENT INTERVENTION N/A 09/01/2017   Procedure: CORONARY STENT INTERVENTION;  Surgeon: Jettie Booze, MD;   Location: [place] [facility];  Service: Cardiovascular;  Laterality: N/A;  . HERNIA REPAIR    . JOINT REPLACEMENT     knee  . PROSTATE SURGERY     needle biopsy's, TURP  . RIGHT/LEFT HEART CATH AND CORONARY ANGIOGRAPHY N/A 09/01/2017   Procedure: RIGHT/LEFT HEART CATH AND CORONARY ANGIOGRAPHY;  Surgeon: Jettie Booze, MD;  Location: [place] CV [facility];  Service: Cardiovascular;  Laterality: N/A;  . TKR bilateral  2006   Dr. Violet Baldy  . UPPER GASTROINTESTINAL ENDOSCOPY    . VASECTOMY      Social History:  reports that he has never smoked. He has never used smokeless tobacco. He reports current alcohol use of about 1.0 standard drinks of alcohol per week. He reports that he does not use drugs.  Family History:  Family History  Problem Relation Age of Onset  . Stroke Mother   . Hypertension Mother   . Heart disease Mother   . Heart disease Father        CAD/MI-fatal sudden death  . Colon cancer Father   . Colon cancer Sister        colon cancer [address]90-survivor  . Heart disease Paternal Uncle   . Colon cancer Paternal Uncle   . Heart disease Paternal Grandfather   . Colon cancer Paternal Aunt   . Colon cancer Cousin   . Colon cancer Cousin   . Colon cancer Paternal Uncle   . Stomach cancer Neg Hx   . Rectal cancer Neg Hx   . Esophageal cancer Neg Hx      Prior to Admission medications   Medication Sig Start Date End Date Taking? Authorizing Provider  aspirin EC 81 MG EC tablet Take 1 tablet (81 mg total) by mouth daily. 09/05/17   Cheryln Manly, NP  atorvastatin (LIPITOR) 80 MG tablet Take 1 tablet (80 mg total) by mouth daily at 6 PM. KEEP OV. 02/25/19   Lorretta Harp, MD  buPROPion (WELLBUTRIN XL) 300 MG 24 hr tablet TAKE 1 TABLET BY MOUTH ONCE DAILY 06/11/18   Hoyt Koch, MD  clopidogrel (PLAVIX) 75 MG tablet Take 1 tablet (75 mg total) by mouth daily. Patient will be scheduled for AUGUST 2020. 12/07/18   Lorretta Harp, MD  escitalopram  Loma Sousa) 10 MG tablet Take 1 tablet by mouth once daily 11/01/18   Hoyt Koch, MD  loratadine (CLARITIN) 10 MG tablet Take 10 mg by mouth daily as needed for allergies.     [provider]  Melatonin 1 MG/ML LIQD Take 2.5 mg by mouth at bedtime as needed (sleep).     [provider]  memantine (NAMENDA XR) 7 MG CP24 24 hr capsule TAKE 1  BY MOUTH ONCE DAILY 02/11/19   Hoyt Koch, MD  nitroGLYCERIN (NITROSTAT) 0.4 MG SL tablet Place 1 tablet (0.4 mg total) under the tongue every 5 (five) minutes as needed. 09/04/17   Cheryln Manly, NP  pantoprazole (PROTONIX) 40  MG tablet Take 1 tablet (40 mg total) by mouth daily. Patient will be scheduled for AUGUST 2020. 12/07/18   Lorretta Harp, MD  UNABLE TO FIND Take by mouth. Med Name: penicillin course for 7 days    [provider]    Physical Exam: Vitals:   03/04/19 0045 03/04/19 0100 03/04/19 0150 03/04/19 0306  BP:  (!) 155/95  (!) 156/101  Pulse: 73 75 71 73  Resp: 19 (!) 22 17 17   Temp:    99.2 F (37.3 C)  TempSrc:    Oral  SpO2: 94% 93% 97% 97%  Weight:      Height:       General: Not in acute distress HEENT:       Eyes: PERRL, EOMI, no scleral icterus.       ENT: No discharge from the ears and nose, no pharynx injection, no tonsillar enlargement.        Neck: No JVD, no bruit, no mass felt. Heme: No neck lymph node enlargement. Cardiac: S1/S2, RRR, No murmurs, No gallops or rubs. Respiratory: No rales, wheezing, rhonchi or rubs. GI: Soft, nondistended, nontender, no rebound pain, no organomegaly, BS present. GU: No hematuria Ext: No pitting leg edema bilaterally. 2+DP/PT pulse bilaterally. Musculoskeletal: No joint deformities, No joint redness or warmth, no limitation of ROM in spin. Skin: No rashes.  Neuro: Alert, oriented X3, cranial nerves II-XII grossly intact, moves all extremities normally. Muscle strength 4/5 in both arms and 3/5 in both legs. Sensation to light touch  intact. Negative Babinski's sign. Psych: Patient is not psychotic, no suicidal or hemocidal ideation.  Labs on Admission: I have personally reviewed following labs and imaging studies  CBC: Recent Labs  Lab 03/03/19 2224  WBC 10.0  NEUTROABS 7.7  HGB 15.0  HCT 44.9  MCV 99.1  PLT 578   Basic Metabolic Panel: Recent Labs  Lab 03/03/19 2224 03/03/19 2307  NA 136 138  K 3.3* 2.5*  CL 98 105  CO2 28 25  GLUCOSE 177* 161*  BUN 23 21  CREATININE 0.99 0.82  CALCIUM 8.9 7.4*  MG  --  1.9   GFR: Estimated Creatinine Clearance: 100 mL/min (by C-G formula based on SCr of 0.82 mg/dL). Liver Function Tests: Recent Labs  Lab 03/03/19 2224 03/03/19 2307  AST 28 20  ALT 20 15  ALKPHOS 70 61  BILITOT 1.3* 0.8  PROT 7.1 5.7*  ALBUMIN 3.3* 2.8*   No results for input(s): LIPASE, AMYLASE in the last 168 hours. No results for input(s): AMMONIA in the last 168 hours. Coagulation Profile: No results for input(s): INR, PROTIME in the last 168 hours. Cardiac Enzymes: No results for input(s): CKTOTAL, CKMB, CKMBINDEX, TROPONINI in the last 168 hours. BNP (last 3 results) No results for input(s): PROBNP in the last 8760 hours. HbA1C: No results for input(s): HGBA1C in the last 72 hours. CBG: No results for input(s): GLUCAP in the last 168 hours. Lipid Profile: No results for input(s): CHOL, HDL, LDLCALC, TRIG, CHOLHDL, LDLDIRECT in the last 72 hours. Thyroid Function Tests: No results for input(s): TSH, T4TOTAL, FREET4, T3FREE, THYROIDAB in the last 72 hours. Anemia Panel: No results for input(s): VITAMINB12, FOLATE, FERRITIN, TIBC, IRON, RETICCTPCT in the last 72 hours. Urine analysis:    Component Value Date/Time   COLORURINE YELLOW 03/03/2019 2340   APPEARANCEUR CLEAR 03/03/2019 2340   LABSPEC 1.025 03/03/2019 2340   PHURINE 6.0 03/03/2019 2340   GLUCOSEU NEGATIVE 03/03/2019 2340   HGBUR TRACE (A)  03/03/2019 [place] 03/03/2019 2340   BILIRUBINUR  negative 09/13/2016 1816   BILIRUBINUR neg 12/06/2011 [place] 03/03/2019 2340   PROTEINUR 30 (A) 03/03/2019 2340   UROBILINOGEN 0.2 09/13/2016 1816   NITRITE NEGATIVE 03/03/2019 2340   LEUKOCYTESUR NEGATIVE 03/03/2019 2340   Sepsis Labs: @LABRCNTIP (procalcitonin:4,lacticidven:4) ) Recent Results (from the past 240 hour(s))  SARS Coronavirus 2 (Hosp order,Performed in [place] lab via [employer] [facility])     Status: None   Collection Time: 03/03/19 10:24 PM   Specimen: Dry Nasal Swab ([employer] [facility] Now)  Result Value Ref Range Status   SARS Coronavirus 2 ([employer] [facility] Now) NEGATIVE NEGATIVE Final    Comment: (NOTE) Interpretive Result Comment(s): COVID 19 Positive SARS CoV 2 target nucleic acids are DETECTED. The SARS CoV 2 RNA is generally detectable in upper and lower respiratory specimens during the acute phase of infection.  Positive results are indicative of active infection with SARS CoV 2.  Clinical correlation with patient history and other diagnostic information is necessary to determine patient infection status.  Positive results do not rule out bacterial infection or coinfection with other viruses. The expected result is Negative. COVID 19 Negative SARS CoV 2 target nucleic acids are NOT DETECTED. The SARS CoV 2 RNA is generally detectable in upper and lower respiratory specimens during the acute phase of infection.  Negative results do not preclude SARS CoV 2 infection, do not rule out coinfections with other pathogens, and should not be used as the sole basis for treatment or other patient management decisions.  Negative results must be combined with clinical  observations, patient history, and epidemiological information. The expected result is Negative. Invalid Presence or absence of SARS CoV 2 nucleic acids cannot be determined. Repeat testing was performed on the submitted specimen and repeated Invalid results were obtained.  If clinically indicated,  additional testing on a new specimen with an alternate test methodology [phone]) is advised.  The SARS CoV 2 RNA is generally detectable in upper and lower respiratory specimens during the acute phase of infection. The expected result is Negative. Fact Sheet for Patients:  GolfingFamily.no Fact Sheet for Healthcare Providers: https://www.hernandez-brewer.com/ This test is not yet approved or cleared by the [place] FDA and has been authorized for detection and/or diagnosis of SARS CoV 2 by FDA under an Emergency Use Authorization (EUA).  This EUA will remain in effect (meaning this test can be used) for the duration of the COVID19 d eclaration under Section 564(b)(1) of the Act, 21 U.S.C. section [phone] 3(b)(1), unless the authorization is terminated or revoked sooner. Performed at [facility], [address] [place], [place] [address]      Radiological Exams on Admission: Ct Head Wo Contrast  Result Date: 03/03/2019 CLINICAL DATA:  Altered level of consciousness, history coronary artery disease, hypertension EXAM: CT HEAD WITHOUT CONTRAST TECHNIQUE: Contiguous axial images were obtained from the base of the skull through the vertex without intravenous contrast. Sagittal and coronal MPR images reconstructed from axial data set. COMPARISON:  02/21/2019 FINDINGS: Brain: Generalized atrophy. Normal ventricular morphology. No midline shift or mass effect. Small vessel chronic ischemic changes of deep cerebral white matter. Old lacunar infarcts at the basal ganglia and LEFT thalamus with extension into RIGHT periventricular white matter. No intracranial hemorrhage or mass lesion. Subtle area of new low-attenuation at the LEFT occipital lobe suspicious for acute infarct. No extra-axial fluid collections. Vascular: Mild atherosclerotic calcifications within vertebral and carotid arteries at  skull base. Skull: Grossly intact, scattered motion  artifacts noted Sinuses/Orbits: Clear Other: N/A IMPRESSION: Atrophy with small vessel chronic ischemic changes of deep cerebral white matter. 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 New area of subtle low attenuation in the LEFT occipital lobe suspicious for acute infarct. No intracranial hemorrhage. Electronically Signed   By: Lavonia Dana M.D.   On: 03/03/2019 22:56   Dg Chest Port 1 View  Result Date: 03/03/2019 CLINICAL DATA:  Cough EXAM: PORTABLE CHEST 1 VIEW COMPARISON:  04/23/2018 FINDINGS: Heart is borderline in size. No confluent airspace opacities. Blunting of the left costophrenic angle may reflect small left effusion or pleural thickening. IMPRESSION: Small left effusion versus pleural thickening. Electronically Signed   By: Rolm Baptise M.D.   On: 03/03/2019 22:58     EKG: Independently reviewed.  Sinus rhythm, QTC 572, mild ST depression in precordial leads, and stable baseline EKG, poor quality.   Assessment/Plan Principal Problem:   Generalized weakness Active Problems:   Hypertension   Hyperlipidemia   Obstructive sleep apnea   Chronic diastolic (congestive) heart failure (HCC)   GERD (gastroesophageal reflux disease)   Memory loss   Hypokalemia   Weakness generalized   CAD (coronary artery disease)  Generalized weakness: Etiology is not clear.  Patient seems to have symmetric weakness in extremities, but CT head showed new area of subtle low attenuation in the LEFT occipital lobe suspicious for acute infarct.  ED physician discussed with neurologist, Dr. Rory Percy who recommended to get MRI of her brain.  If positive for stroke, needed to call back to neurologist.  - will admit to tele bed as inpt - Obtain MRI-brain - Continue Plavix and add ASA, lipitor - fasting lipid panel and HbA1c  - swallowing screen. If fails, will get SLP - PT/OT consult - check TSH -consult CM and SW for possible SNF placement  HTN: pt is not taking meds at home  -IV hydralazine prn   Hyperlipidemia: -lipitor  Obstructive sleep apnea: -CPAP  Chronic diastolic (congestive) heart failure ([place]): 2D echo on 08/30/2017 showed EF of 55-60% with grade 1 diastolic dysfunction.  Patient does not have leg edema or JVD.  No shortness of breath.  CHF is compensated. -Check BNP  GERD (gastroesophageal reflux disease): -Protonix  Memory loss: -Namenda  Hypokalemia: K= 2.5   on admission. Mg 1.9 - Repleted with total of 120 mEQ of KCl - Give 1 g of magnesium sulfate  Hx of CAD (coronary artery disease): s/p of stent. No chest pain. Trop negative x 2 -Continue Lipitor, aspirin, Plavix - PRN nitroglycerin  Depression and anxiety:  -hold Lexapro, Wellbutrin due to QTc prolongation    Inpatient status:  # Patient requires inpatient status due to high intensity of service, high risk for further deterioration and high frequency of surveillance required.  I certify that at the point of admission it is my clinical judgment that the patient will require inpatient hospital care spanning beyond 2 midnights from the point of admission.  . This patient has multiple chronic comorbidities including hypertension, hyperlipidemia, GERD, depression, CAD, stent placement, PE 2014 (not on anticoagulants currently), OSA on CPAP, dCHF, who presents with generalized weakness. .  . Now patient has presenting with generalized weakness and a possible stroke . The worrisome physical exam findings include weakness in extremities. . The initial radiographic and laboratory data are worrisome because CT of the head showed new area of subtle low attenuation in the LEFT occipital lobe suspicious for acute infarct. . Current medical needs:  please see my assessment and plan . Predictability of an adverse outcome (risk): Patient has multiple comorbidities, now presents with generalized weakness, CT head showed possible stroke.  Given his old age and multiple comorbidities, patient is at high risk for  deteriorating.  Will need to be treated in hospital for at least 2 days.     DVT ppx: SQ Lovenox Code Status: Full code Family Communication: None at bed side.    Disposition Plan: to be determined Consults called:  EDP discussed with neurologist, Dr. Rory Percy Admission status:  Inpatient/tele     Date of Service 03/04/2019    [facility]   If 7PM-7AM, please contact night-coverage [email or web address] Password [email or web address] 03/04/2019, 5:11 AM

## 2019-03-04 NOTE — ED Notes (Signed)
ED TO INPATIENT HANDOFF REPORT  ED Nurse Name and Phone #: Gilda Crease Name/Age/Gender John Blackburn 73 y.o. male Room/Bed: MH05/MH05  Code Status   Code Status: Prior  Home/SNF/Other Rehab Patient oriented to: self Is this baseline? No   Triage Complete: Triage complete  Chief Complaint Urinary Frequency  Triage Note Brought by ems from home for c/o increased urinary frequency with foul odor and dark color per son.   Allergies Allergies  Allergen Reactions  . Cephalexin Rash  . Levofloxacin Rash    Level of Care/Admitting Diagnosis ED Disposition    ED Disposition Condition [facility] Area: [place] [100100]  Level of Care: Telemetry Medical [104]  Covid Evaluation: Screening Protocol (No Symptoms)  Diagnosis: Weakness generalized [989211]  Admitting Physician: Elwyn Reach [[id number]]  Attending Physician: Elwyn Reach [[id number]]  Estimated length of stay: past midnight tomorrow  Certification:: I certify this patient will need inpatient services for at least 2 midnights  PT Class (Do Not Modify): Inpatient [101]  PT Acc Code (Do Not Modify): Private [1]       B Medical/Surgery History Past Medical History:  Diagnosis Date  . Allergy   . 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 CAD (coronary artery disease)    08/2017 PCI/DES mRCA, RPDA, CTO pf pLCX with collaterals, normal EF  . Depression    on multiple meds. Has been seen at [facility]. and Dr. Sabra Heck is his prescriber  . Diverticulitis of colon 2000 's   treated as an outpatient  . GERD (gastroesophageal reflux disease)    UGI done August '12 - ulcer/. Dr. Ferdinand Lango, gastroenterologist in [facility].   [name] Hx of adenomatous colonic polyps   . Hyperlipidemia    last lipid panel: HDL 44, LDL 117  . Hypertension   . Myocardial infarction ([place])   . Peptic ulcer    in the past and just recently-August '12  . Pulmonary emboli [facility])    noted  October 2014 - treated by Dr. Linda Hedges  . skin cancer    skin CA  . Sleep apnea, primary central    wears CPAP   Past Surgical History:  Procedure Laterality Date  . ANAL RECTAL MANOMETRY N/A 11/30/2016   Procedure: ANO RECTAL MANOMETRY;  Surgeon: Mauri Pole, MD;  Location: [facility];  Service: Endoscopy;  Laterality: N/A;  . CARDIAC CATHETERIZATION    . CHOLECYSTECTOMY  1990   laproscopic   . CORONARY STENT INTERVENTION N/A 09/01/2017   Procedure: CORONARY STENT INTERVENTION;  Surgeon: Jettie Booze, MD;  Location: [place] [facility];  Service: Cardiovascular;  Laterality: N/A;  . HERNIA REPAIR    . JOINT REPLACEMENT     knee  . PROSTATE SURGERY     needle biopsy's, TURP  . RIGHT/LEFT HEART CATH AND CORONARY ANGIOGRAPHY N/A 09/01/2017   Procedure: RIGHT/LEFT HEART CATH AND CORONARY ANGIOGRAPHY;  Surgeon: Jettie Booze, MD;  Location: [place] [facility];  Service: Cardiovascular;  Laterality: N/A;  . TKR bilateral  2006   Dr. Violet Baldy  . UPPER GASTROINTESTINAL ENDOSCOPY    . VASECTOMY       A IV Location/Drains/Wounds Patient Lines/Drains/Airways Status   Active Line/Drains/Airways    Name:   Placement date:   Placement time:   Site:   Days:   Peripheral IV 03/03/19 Left Forearm   03/03/19    2220    Forearm  1   Peripheral IV 03/03/19 Right Antecubital   03/03/19    2230    Antecubital   1          Intake/Output Last 24 hours  Intake/Output Summary (Last 24 hours) at 03/04/2019 0044 Last data filed at 03/03/2019 2350 Gross per 24 hour  Intake -  Output 200 ml  Net -200 ml    Labs/Imaging Results for orders placed or performed during the hospital encounter of 03/03/19 (from the past 48 hour(s))  CBC with Differential/Platelet     Status: None   Collection Time: 03/03/19 10:24 PM  Result Value Ref Range   WBC 10.0 4.0 - 10.5 K/uL   RBC 4.53 4.22 - 5.81 MIL/uL   Hemoglobin 15.0 13.0 - 17.0 g/dL   HCT 44.9 39.0 - 52.0 %   MCV 99.1 80.0  - 100.0 fL   MCH 33.1 26.0 - 34.0 pg   MCHC 33.4 30.0 - 36.0 g/dL   RDW 13.3 11.5 - 15.5 %   Platelets 248 150 - 400 K/uL   nRBC 0.0 0.0 - 0.2 %   Neutrophils Relative % 76 %   Neutro Abs 7.7 1.7 - 7.7 K/uL   Lymphocytes Relative 13 %   Lymphs Abs 1.2 0.7 - 4.0 K/uL   Monocytes Relative 7 %   Monocytes Absolute 0.7 0.1 - 1.0 K/uL   Eosinophils Relative 3 %   Eosinophils Absolute 0.3 0.0 - 0.5 K/uL   Basophils Relative 1 %   Basophils Absolute 0.1 0.0 - 0.1 K/uL   Immature Granulocytes 0 %   Abs Immature Granulocytes 0.03 0.00 - 0.07 K/uL    Comment: Performed at [facility], [address] [place], [place] [address]  Comprehensive metabolic panel     Status: Abnormal   Collection Time: 03/03/19 10:24 PM  Result Value Ref Range   Sodium 136 135 - 145 mmol/L   Potassium 3.3 (L) 3.5 - 5.1 mmol/L   Chloride 98 98 - 111 mmol/L   CO2 28 22 - 32 mmol/L   Glucose, Bld 177 (H) 70 - 99 mg/dL   BUN 23 8 - 23 mg/dL   Creatinine, Ser 0.99 0.61 - 1.24 mg/dL   Calcium 8.9 8.9 - 10.3 mg/dL   Total Protein 7.1 6.5 - 8.1 g/dL   Albumin 3.3 (L) 3.5 - 5.0 g/dL   AST 28 15 - 41 U/L   ALT 20 0 - 44 U/L   Alkaline Phosphatase 70 38 - 126 U/L   Total Bilirubin 1.3 (H) 0.3 - 1.2 mg/dL   GFR calc non Af Amer >60 >60 mL/min   GFR calc Af Amer >60 >60 mL/min   Anion gap 10 5 - 15    Comment: Performed at [facility], [address] [place], [place] [address]  Troponin I (High Sensitivity)     Status: None   Collection Time: 03/03/19 10:24 PM  Result Value Ref Range   Troponin I (High Sensitivity) 12 <18 ng/L    Comment: (NOTE) Elevated high sensitivity troponin I (hsTnI) values and significant  changes across serial measurements may suggest ACS but many other  chronic and acute conditions are known to elevate hsTnI results.  Refer to the "Links" section for chest pain algorithms and additional  guidance. Performed at [facility], [address]  [place], [place] [address]   SARS Coronavirus 2 (Hosp order,Performed in [facility] lab via [employer] ID)  Status: None   Collection Time: 03/03/19 10:24 PM   Specimen: Dry Nasal Swab ([employer] ID Now)  Result Value Ref Range   SARS Coronavirus 2 ([employer] ID Now) NEGATIVE NEGATIVE    Comment: (NOTE) Interpretive Result Comment(s): COVID 19 Positive SARS CoV 2 target nucleic acids are DETECTED. The SARS CoV 2 RNA is generally detectable in upper and lower respiratory specimens during the acute phase of infection.  Positive results are indicative of active infection with SARS CoV 2.  Clinical correlation with patient history and other diagnostic information is necessary to determine patient infection status.  Positive results do not rule out bacterial infection or coinfection with other viruses. The expected result is Negative. COVID 19 Negative SARS CoV 2 target nucleic acids are NOT DETECTED. The SARS CoV 2 RNA is generally detectable in upper and lower respiratory specimens during the acute phase of infection.  Negative results do not preclude SARS CoV 2 infection, do not rule out coinfections with other pathogens, and should not be used as the sole basis for treatment or other patient management decisions.  Negative results must be combined with clinical  observations, patient history, and epidemiological information. The expected result is Negative. Invalid Presence or absence of SARS CoV 2 nucleic acids cannot be determined. Repeat testing was performed on the submitted specimen and repeated Invalid results were obtained.  If clinically indicated, additional testing on a new specimen with an alternate test methodology [id number] is advised.  The SARS CoV 2 RNA is generally detectable in upper and lower respiratory specimens during the acute phase of infection. The expected result is Negative. Fact Sheet for Patients:  GolfingFamily.no Fact Sheet for  Healthcare Providers: https://www.hernandez-brewer.com/ This test is not yet approved or cleared by the [place] FDA and has been authorized for detection and/or diagnosis of SARS CoV 2 by FDA under an Emergency Use Authorization (EUA).  This EUA will remain in effect (meaning this test can be used) for the duration of the COVID19 d eclaration under Section 564(b)(1) of the Act, 21 U.S.C. section [phone] 3(b)(1), unless the authorization is terminated or revoked sooner. Performed at [facility], [address] [place], [place] [address]   Comprehensive metabolic panel     Status: Abnormal   Collection Time: 03/03/19 11:07 PM  Result Value Ref Range   Sodium 138 135 - 145 mmol/L   Potassium 2.5 (LL) 3.5 - 5.1 mmol/L    Comment: DELTA CHECK NOTED CRITICAL RESULT CALLED TO, READ BACK BY AND VERIFIED WITH: THOMPSON,A, RN @ 2337 03/03/19 BY GWYN,P     Chloride 105 98 - 111 mmol/L   CO2 25 22 - 32 mmol/L   Glucose, Bld 161 (H) 70 - 99 mg/dL   BUN 21 8 - 23 mg/dL   Creatinine, Ser 0.82 0.61 - 1.24 mg/dL   Calcium 7.4 (L) 8.9 - 10.3 mg/dL   Total Protein 5.7 (L) 6.5 - 8.1 g/dL   Albumin 2.8 (L) 3.5 - 5.0 g/dL   AST 20 15 - 41 U/L   ALT 15 0 - 44 U/L   Alkaline Phosphatase 61 38 - 126 U/L   Total Bilirubin 0.8 0.3 - 1.2 mg/dL   GFR calc non Af Amer >60 >60 mL/min   GFR calc Af Amer >60 >60 mL/min   Anion gap 8 5 - 15    Comment: Performed at [facility], [address] [place], [place] [address]  Magnesium     Status:  None   Collection Time: 03/03/19 11:07 PM  Result Value Ref Range   Magnesium 1.9 1.7 - 2.4 mg/dL    Comment: Performed at [facility], [address] [place], [place] [address]  Urinalysis, Routine w reflex microscopic     Status: Abnormal   Collection Time: 03/03/19 11:40 PM  Result Value Ref Range   Color, Urine YELLOW YELLOW   APPearance CLEAR CLEAR   Specific Gravity, Urine 1.025 1.005 - 1.030   pH 6.0 5.0 -  8.0   Glucose, UA NEGATIVE NEGATIVE mg/dL   Hgb urine dipstick TRACE (A) NEGATIVE   Bilirubin Urine NEGATIVE NEGATIVE   Ketones, ur NEGATIVE NEGATIVE mg/dL   Protein, ur 30 (A) NEGATIVE mg/dL   Nitrite NEGATIVE NEGATIVE   Leukocytes,Ua NEGATIVE NEGATIVE    Comment: Performed at [facility], [address] [place], [place] [address]  Urinalysis, Microscopic (reflex)     Status: Abnormal   Collection Time: 03/03/19 11:40 PM  Result Value Ref Range   RBC / HPF 0-5 0 - 5 RBC/hpf   WBC, UA 0-5 0 - 5 WBC/hpf   Bacteria, UA FEW (A) NONE SEEN   Squamous Epithelial / LPF 0-5 0 - 5   Non Squamous Epithelial PRESENT (A) NONE SEEN    Comment: Performed at [facility], [address] [place], [place] [address]  Troponin I (High Sensitivity)     Status: None   Collection Time: 03/04/19 12:06 AM  Result Value Ref Range   Troponin I (High Sensitivity) 14 <18 ng/L    Comment: (NOTE) Elevated high sensitivity troponin I (hsTnI) values and significant  changes across serial measurements may suggest ACS but many other  chronic and acute conditions are known to elevate hsTnI results.  Refer to the "Links" section for chest pain algorithms and additional  guidance. Performed at [facility], [address] [place], [place] [address]    Ct Head Wo Contrast  Result Date: 03/03/2019 CLINICAL DATA:  Altered level of consciousness, history coronary artery disease, hypertension EXAM: CT HEAD WITHOUT CONTRAST TECHNIQUE: Contiguous axial images were obtained from the base of the skull through the vertex without intravenous contrast. Sagittal and coronal MPR images reconstructed from axial data set. COMPARISON:  02/21/2019 FINDINGS: Brain: Generalized atrophy. Normal ventricular morphology. No midline shift or mass effect. Small vessel chronic ischemic changes of deep cerebral white matter. 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 No intracranial hemorrhage or mass lesion. Subtle area of new low-attenuation at the LEFT occipital lobe suspicious for acute infarct. No extra-axial fluid collections. Vascular: Mild atherosclerotic calcifications within vertebral and carotid arteries at skull base. Skull: Grossly intact, scattered motion artifacts noted Sinuses/Orbits: Clear Other: N/A IMPRESSION: Atrophy with small vessel chronic ischemic changes of deep cerebral white matter. 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 New area of subtle low attenuation in the LEFT occipital lobe suspicious for acute infarct. No intracranial hemorrhage. Electronically Signed   By: Lavonia Dana M.D.   On: 03/03/2019 22:56   Dg Chest Port 1 View  Result Date: 03/03/2019 CLINICAL DATA:  Cough EXAM: PORTABLE CHEST 1 VIEW COMPARISON:  04/23/2018 FINDINGS: Heart is borderline in size. No confluent airspace opacities. Blunting of the left costophrenic angle may reflect small left effusion or pleural thickening. IMPRESSION: Small left effusion versus pleural thickening. Electronically Signed   By: Rolm Baptise M.D.   On: 03/03/2019  22:58    Pending Labs Unresulted Labs (From admission, onward)    Start     Ordered   03/03/19 2356  Magnesium  Add-on,   AD     03/03/19 2356   03/03/19 2210  Blood culture (routine x 2)  BLOOD CULTURE X 2,   STAT     03/03/19 2209   03/03/19 2206  Urine Culture  Once,   STAT    Question:  Patient immune status  Answer:  Normal   03/03/19 2208          Vitals/Pain Today's Vitals   03/03/19 2212 03/03/19 2315 03/03/19 2330 03/04/19 0000  BP:   (!) 145/95 (!) 154/86  Pulse:  81  78  Resp:  (!) 24  (!) 23  Temp: 99.4 F (37.4 C)     TempSrc: Rectal     SpO2:  95%  94%  Weight:      Height:        Isolation Precautions No active isolations  Medications Medications  potassium chloride 10 mEq in 100 mL IVPB (10 mEq Intravenous New Bag/Given 03/04/19 0005)    Mobility walks with  device High fall risk   Focused Assessments Neuro Assessment Handoff:  Swallow screen pass? Yes  Cardiac Rhythm: Normal sinus rhythm       Neuro Assessment: Exceptions to WDL Neuro Checks:      Last Documented NIHSS Modified Score:   Has TPA been given? No If patient is a Neuro Trauma and patient is going to OR before floor call report to [place] nurse: [phone] or [phone]     R Recommendations: See Admitting Provider Note  Report given to:   Additional Notes:

## 2019-03-05 ENCOUNTER — Other Ambulatory Visit: Payer: Self-pay | Admitting: *Deleted

## 2019-03-05 LAB — LIPID PANEL
Cholesterol: 111 mg/dL (ref 0–200)
HDL: 27 mg/dL — ABNORMAL LOW (ref >40)
LDL Cholesterol: 73 mg/dL (ref 0–99)
Total CHOL/HDL Ratio: 4.1 RATIO
Triglycerides: 53 mg/dL (ref <150)
VLDL: 11 mg/dL (ref 0–40)

## 2019-03-05 LAB — BASIC METABOLIC PANEL
Anion gap: 9 (ref 5–15)
BUN: 13 mg/dL (ref 8–23)
CO2: 25 mmol/L (ref 22–32)
Calcium: 8.9 mg/dL (ref 8.9–10.3)
Chloride: 106 mmol/L (ref 98–111)
Creatinine, Ser: 0.83 mg/dL (ref 0.61–1.24)
GFR calc Af Amer: >60 mL/min (ref >60)
GFR calc non Af Amer: >60 mL/min (ref >60)
Glucose, Bld: 103 mg/dL — ABNORMAL HIGH (ref 70–99)
Potassium: 4.2 mmol/L (ref 3.5–5.1)
Sodium: 140 mmol/L (ref 135–145)

## 2019-03-05 LAB — MAGNESIUM: Magnesium: 2.1 mg/dL (ref 1.7–2.4)

## 2019-03-05 LAB — URINE CULTURE: Culture: NO GROWTH

## 2019-03-05 LAB — HEMOGLOBIN A1C
Hgb A1c MFr Bld: 5.4 % (ref 4.8–5.6)
Mean Plasma Glucose: 108.28 mg/dL

## 2019-03-05 LAB — FOLATE RBC
Folate, Hemolysate: 452 ng/mL
Folate, RBC: 1100 ng/mL (ref >498)
Hematocrit: 41.1 % (ref 37.5–51.0)

## 2019-03-05 LAB — RPR: RPR Ser Ql: NONREACTIVE

## 2019-03-05 NOTE — Progress Notes (Signed)
PROGRESS NOTE    John Blackburn  JME:[id number] DOB: 1945-12-20 DOA: 03/03/2019 PCP: Hoyt Koch, MD    Brief Narrative: 73 y.o. male with medical history significant of hypertension, hyperlipidemia, GERD, depression, CAD, stent placement, PE 2014 (not on anticoagulants currently), OSA on CPAP, dCHF, who presents with generalized weakness.  Per report, pt's son reported to EDP that patient has been declining recently.  Patient has generalized weakness, decreased mobility, decreased appetite. Last week, patient did have a fall and hit his head and was evaluated at [facility] with a negative head CT. Pt is so week that he has difficulty moving from chair to bed without help.  Patient denies any chest pain, shortness breath.  He has mild dry cough.  No fever or chills. Has nausea, but no vomiting, diarrhea or abdominal pain.  Patient has increased urinary frequency per his son's report.  Patient denies unilateral numbness or tingling in his extremities. No facial droop or slurred speech. Family is currently working to get the patient into a memory care unit but is still filling out paperwork.  ED Course: pt was found to have negative troponin x2, negative urinalysis, negative COVID-19 test, potassium 2.5, renal function normal, temperature 99.2, blood pressure 156/101, heart rate 73, oxygen saturation 93 to 97% on room air.  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 Pt is admitted to telemetry bed as inpatient.  ED physician discussed with neurologist, Dr. Rory Percy, who recommended MRI for brain.   Assessment & Plan:   Principal Problem:   Generalized weakness Active Problems:   Hypertension   Hyperlipidemia   Obstructive sleep apnea   Chronic diastolic (congestive) heart failure (HCC)   GERD (gastroesophageal reflux disease)   Memory loss   Hypokalemia   Weakness generalized   CAD  (coronary artery disease)  Generalized weakness: Secondary to severe hypokalemia with a K of 2.5 at the time of admission.  No evidence of acute stroke.  MRI of the brain shows-acute finding advanced chronic small vessel ischemia.  B12 286 LDL 73 hemoglobin A1c 5.4 TSH 1.17 sed rate 31 CPK normal. -Patient seen by PT recommends SNF.  HTN: pt is not taking meds at home  start Norvasc 2.5 mg daily. -IV hydralazine prn  Hyperlipidemia: -lipitor  Obstructive sleep apnea: -CPAP  Chronic diastolic (congestive) heart failure ([place]): 2D echo on 08/30/2017 showed EF of 55-60% with grade 1 diastolic dysfunction.  Patient does not have leg edema or JVD.  No shortness of breath.  CHF is compensated.   GERD (gastroesophageal reflux disease): -Protonix  Memory loss: -Namenda  Hypokalemia: K= 2.5   on admission. Mg 1.9, potassium 4.2 today  Hx of CAD (coronary artery disease): s/p of stent. No chest pain. Trop negative x 2 -Continue Lipitor, aspirin, Plavix - PRN nitroglycerin  Depression and anxiety:  -hold Lexapro, Wellbutrin due to QTc prolongation    Estimated body mass index is 30.59 kg/m as calculated from the following:   Height as of this encounter: 6' (1.829 m).   Weight as of this encounter: 102.3 kg.   Subjective: Feels a little stronger than yesterday anxious to go to rehab  Objective: Vitals:   03/04/19 2100 03/04/19 2320 03/05/19 0306 03/05/19 0753  BP:  (!) 165/91 (!) 160/95 (!) 164/112  Pulse:  72 73 75  Resp: 18 20 16 18   Temp:  98 F (36.7 C) 97.7 F (36.5 C)  98.3 F (36.8 C)  TempSrc:   Oral Oral  SpO2:  98% 100% 96%  Weight:      Height:        Intake/Output Summary (Last 24 hours) at 03/05/2019 1150 Last data filed at 03/05/2019 1115 Gross per 24 hour  Intake 375.75 ml  Output 3650 ml  Net -3274.25 ml   Filed Weights   03/03/19 2154 03/04/19 0657  Weight: 100.7 kg 102.3 kg    Examination:  General exam: Appears calm and comfortable   Respiratory system: Clear to auscultation. Respiratory effort normal. Cardiovascular system: S1 & S2 heard, RRR. No JVD, murmurs, rubs, gallops or clicks. No pedal edema. Gastrointestinal system: Abdomen is nondistended, soft and nontender. No organomegaly or masses felt. Normal bowel sounds heard. Central nervous system: Alert and oriented. No focal neurological deficits. Extremities: Symmetric 5 x 5 power. Skin: No rashes, lesions or ulcers Psychiatry: Judgement and insight appear normal. Mood & affect appropriate.     Data Reviewed: I have personally reviewed following labs and imaging studies  CBC: Recent Labs  Lab 03/03/19 2224 03/04/19 0455  WBC 10.0 9.7  NEUTROABS 7.7  --   HGB 15.0 14.3  HCT 44.9 42.9  MCV 99.1 97.9  PLT 248 333   Basic Metabolic Panel: Recent Labs  Lab 03/03/19 2224 03/03/19 2307 03/04/19 0455 03/04/19 1357 03/05/19 0413  NA 136 138 138 140 140  K 3.3* 2.5* 3.3* 3.8 4.2  CL 98 105 101 103 106  CO2 28 25 27 28 25   GLUCOSE 177* 161* 104* 150* 103*  BUN 23 21 18 16 13   CREATININE 0.99 0.82 0.84 0.89 0.83  CALCIUM 8.9 7.4* 8.8* 8.9 8.9  MG  --  1.9  --   --  2.1   GFR: Estimated Creatinine Clearance: 99.6 mL/min (by C-G formula based on SCr of 0.83 mg/dL). Liver Function Tests: Recent Labs  Lab 03/03/19 2224 03/03/19 2307  AST 28 20  ALT 20 15  ALKPHOS 70 61  BILITOT 1.3* 0.8  PROT 7.1 5.7*  ALBUMIN 3.3* 2.8*   No results for input(s): LIPASE, AMYLASE in the last 168 hours. No results for input(s): AMMONIA in the last 168 hours. Coagulation Profile: Recent Labs  Lab 03/04/19 0455  INR 1.1   Cardiac Enzymes: Recent Labs  Lab 03/04/19 1116  CKTOTAL 53   BNP (last 3 results) No results for input(s): PROBNP in the last 8760 hours. HbA1C: Recent Labs    03/05/19 0413  HGBA1C 5.4   CBG: No results for input(s): GLUCAP in the last 168 hours. Lipid Profile: Recent Labs    03/05/19 0413  CHOL 111  HDL 27*  LDLCALC 73   TRIG 53  CHOLHDL 4.1   Thyroid Function Tests: Recent Labs    03/04/19 0455  TSH 1.177   Anemia Panel: Recent Labs    03/04/19 1116  VITAMINB12 286   Sepsis Labs: No results for input(s): PROCALCITON, LATICACIDVEN in the last 168 hours.  Recent Results (from the past 240 hour(s))  Blood culture (routine x 2)     Status: None (Preliminary result)   Collection Time: 03/03/19 10:20 PM   Specimen: BLOOD LEFT FOREARM  Result Value Ref Range Status   Specimen Description   Final    BLOOD LEFT FOREARM Performed at [facility], [address] [place], [place] [address]    Special Requests   Final    BOTTLES DRAWN AEROBIC AND ANAEROBIC Blood Culture adequate volume Performed  at [facility], [address] [place], [place] [address]    Culture   Final    NO GROWTH < 24 HOURS Performed at [facility], [place] [address] [place], [place] [address]    Report Status PENDING  Incomplete  Blood culture (routine x 2)     Status: None (Preliminary result)   Collection Time: 03/03/19 10:20 PM   Specimen: BLOOD RIGHT ARM  Result Value Ref Range Status   Specimen Description   Final    BLOOD RIGHT ARM Performed at [facility], [address] [place], [place] [address]    Special Requests   Final    BOTTLES DRAWN AEROBIC AND ANAEROBIC Blood Culture adequate volume Performed at [facility], [address] [place], [place] [address]    Culture   Final    NO GROWTH < 24 HOURS Performed at [facility], [place] [address] [place], [place] [address]    Report Status PENDING  Incomplete  SARS Coronavirus 2 (Hosp order,Performed in [facility] via [employer] ID)     Status: None   Collection Time: 03/03/19 10:24 PM   Specimen: Dry Nasal Swab (Abbott ID Now)  Result Value Ref Range Status   SARS Coronavirus 2 (Abbott ID Now) NEGATIVE NEGATIVE Final    Comment: (NOTE) Interpretive Result Comment(s): COVID 19 Positive SARS CoV 2  target nucleic acids are DETECTED. The SARS CoV 2 RNA is generally detectable in upper and lower respiratory specimens during the acute phase of infection.  Positive results are indicative of active infection with SARS CoV 2.  Clinical correlation with patient history and other diagnostic information is necessary to determine patient infection status.  Positive results do not rule out bacterial infection or coinfection with other viruses. The expected result is Negative. COVID 19 Negative SARS CoV 2 target nucleic acids are NOT DETECTED. The SARS CoV 2 RNA is generally detectable in upper and lower respiratory specimens during the acute phase of infection.  Negative results do not preclude SARS CoV 2 infection, do not rule out coinfections with other pathogens, and should not be used as the sole basis for treatment or other patient management decisions.  Negative results must be combined with clinical  observations, patient history, and epidemiological information. The expected result is Negative. Invalid Presence or absence of SARS CoV 2 nucleic acids cannot be determined. Repeat testing was performed on the submitted specimen and repeated Invalid results were obtained.  If clinically indicated, additional testing on a new specimen with an alternate test methodology [phone]) is advised.  The SARS CoV 2 RNA is generally detectable in upper and lower respiratory specimens during the acute phase of infection. The expected result is Negative. Fact Sheet for Patients:  GolfingFamily.no Fact Sheet for Healthcare Providers: https://www.hernandez-brewer.com/ This test is not yet approved or cleared by the [place] FDA and has been authorized for detection and/or diagnosis of SARS CoV 2 by FDA under an Emergency Use Authorization (EUA).  This EUA will remain in effect (meaning this test can be used) for the duration of the COVID19 d eclaration under  Section 564(b)(1) of the Act, 21 U.S.C. section [phone] 3(b)(1), unless the authorization is terminated or revoked sooner. Performed at [facility], [address] [place], [place] [address]   Urine Culture     Status: None   Collection Time: 03/03/19 11:40 PM   Specimen: Urine, Random  Result  Value Ref Range Status   Specimen Description   Final    URINE, RANDOM Performed at [facility], [address] [place], [place] [address]    Special Requests   Final    NONE Performed at [facility], [address] [place], [place] [address]    Culture   Final    NO GROWTH Performed at [facility], [place] [address], [place], [place] [address]    Report Status 03/05/2019 FINAL  Final         Radiology Studies: Ct Head Wo Contrast  Result Date: 03/03/2019 CLINICAL DATA:  Altered level of consciousness, history coronary artery disease, hypertension EXAM: CT HEAD WITHOUT CONTRAST TECHNIQUE: Contiguous axial images were obtained from the base of the skull through the vertex without intravenous contrast. Sagittal and coronal MPR images reconstructed from axial data set. COMPARISON:  02/21/2019 FINDINGS: Brain: Generalized atrophy. Normal ventricular morphology. No midline shift or mass effect. Small vessel chronic ischemic changes of deep cerebral white matter. Old lacunar infarcts at the basal ganglia and LEFT thalamus with extension into RIGHT periventricular white matter. No intracranial hemorrhage or mass lesion. Subtle area of new low-attenuation at the LEFT occipital lobe suspicious for acute infarct. No extra-axial fluid collections. Vascular: Mild atherosclerotic calcifications within vertebral and carotid arteries at skull base. Skull: Grossly intact, scattered motion artifacts noted Sinuses/Orbits: Clear Other: N/A IMPRESSION: Atrophy with small vessel chronic ischemic changes of deep cerebral white matter. 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 New area  of subtle low attenuation in the LEFT occipital lobe suspicious for acute infarct. No intracranial hemorrhage. Electronically Signed   By: Lavonia Dana M.D.   On: 03/03/2019 22:56   Mr Brain Wo Contrast  Result Date: 03/04/2019 CLINICAL DATA:  Focal neuro deficit EXAM: MRI HEAD WITHOUT CONTRAST TECHNIQUE: Multiplanar, multiecho pulse sequences of the brain and surrounding structures were obtained without intravenous contrast. COMPARISON:  Head CT from yesterday.  Brain MRI 07/01/2018 FINDINGS: Brain: No acute infarction, hemorrhage, hydrocephalus, extra-axial collection or mass lesion. Advanced chronic small vessel ischemia with confluent gliosis in the deep white matter and across the bilateral pons. There are accentuated dilated perivascular spaces with remote bilateral thalamic infarcts and a right lateral lenticulostriate basal ganglia infarct. Chronic blood products in the right parietal lobe. Mild cerebral volume loss. Vascular: Major flow voids are preserved. Skull and upper cervical spine: Negative for marrow lesion Sinuses/Orbits: Negative IMPRESSION: 1. No acute finding. 2. Advanced chronic small vessel ischemia. Electronically Signed   By: Monte Fantasia M.D.   On: 03/04/2019 07:01   Dg Chest Port 1 View  Result Date: 03/03/2019 CLINICAL DATA:  Cough EXAM: PORTABLE CHEST 1 VIEW COMPARISON:  04/23/2018 FINDINGS: Heart is borderline in size. No confluent airspace opacities. Blunting of the left costophrenic angle may reflect small left effusion or pleural thickening. IMPRESSION: Small left effusion versus pleural thickening. Electronically Signed   By: Rolm Baptise M.D.   On: 03/03/2019 22:58        Scheduled Meds: . aspirin  81 mg Oral Daily  . clopidogrel  75 mg Oral Daily  . enoxaparin (LOVENOX) injection  40 mg Subcutaneous Q24H  . memantine  7 mg Oral Daily  . pantoprazole  40 mg Oral Daily   Continuous Infusions:   LOS: 1 day     Georgette Shell, MD [facility]   If 7PM-7AM, please contact night-coverage [email or web address] Password [email or web address] 03/05/2019, 11:50 AM

## 2019-03-05 NOTE — NC FL2 (Addendum)
[place] LEVEL OF CARE SCREENING TOOL     IDENTIFICATION  Patient Name: John Blackburn Birthdate: 1945/11/13 Sex: male Admission Date (Current Location): 03/03/2019  [facility] and [place] Number:  Herbalist and Address:  [facility] [place], [place] [address], [place], [place] [address]      Provider Number: [id number]  Attending Physician Name and Address:  Georgette Shell, MD  Relative Name and Phone Number:       Current Level of Care: Hospital Recommended Level of Care: [place] Prior Approval Number:    Date Approved/Denied:   PASRR Number: [id number]  Discharge Plan: SNF    Current Diagnoses: Patient Active Problem List   Diagnosis Date Noted  . Weakness generalized 03/04/2019  . CAD (coronary artery disease) 03/04/2019  . Generalized weakness 03/04/2019  . Vascular dementia ([place]) 07/03/2018  . ARF (acute renal failure) ([place]) 04/23/2018  . Hypokalemia 04/23/2018  . Anorexia 04/23/2018  . Leucocytosis 04/23/2018  . EKG abnormalities 04/23/2018  . B12 deficiency 04/12/2018  . Memory loss 11/28/2017  . Balance problem 11/28/2017  . skin cancer   . Sleep apnea, primary central   . GERD (gastroesophageal reflux disease)   . MDD (major depressive disorder), single episode, moderate ([place])   . CAD S/P percutaneous coronary angioplasty 09/02/2017  . Chronic diastolic (congestive) heart failure ([place]) 08/30/2017  . Elevated blood sugar 01/17/2017  . Incontinence of feces   . Allergic rhinitis 10/15/2013  . Chronic venous insufficiency 07/20/2013  . History of pulmonary embolism 07/08/2013  . ED (erectile dysfunction) 06/01/2011  . Arthritis   . Hypertension   . Hyperlipidemia   . Obstructive sleep apnea     Orientation RESPIRATION BLADDER Height & Weight     Self, Place  Normal Incontinent Weight: 102.3 kg Height:  6' (182.9 cm)  BEHAVIORAL SYMPTOMS/MOOD NEUROLOGICAL BOWEL NUTRITION STATUS    Incontinent Diet(heart healthy with thin liquids)  AMBULATORY STATUS COMMUNICATION OF NEEDS Skin   Extensive Assist Verbally (ecchymosis to rt leg)                       Personal Care Assistance Level of Assistance  Bathing, Feeding, Dressing Bathing Assistance: Maximum assistance Feeding assistance: Limited assistance Dressing Assistance: Maximum assistance     Functional Limitations Info  Sight, Hearing, Speech Sight Info: Adequate Hearing Info: Adequate Speech Info: Adequate    SPECIAL CARE FACTORS FREQUENCY  PT (By licensed PT), OT (By licensed OT), Speech therapy     PT Frequency: 5x/wk OT Frequency: 5x/wk     Speech Therapy Frequency: 5x/wk      Contractures Contractures Info: Not present    Additional Factors Info  Code Status, Allergies, Psychotropic Code Status Info: Full Allergies Info: Cephalexin/ levofloxacin Psychotropic Info: Namenda XR 7 mg Daily         Current Medications (03/05/2019):  This is the current hospital active medication list Current Facility-Administered Medications  Medication Dose Route Frequency Provider Last Rate Last Dose  . acetaminophen (TYLENOL) tablet 650 mg  650 mg Oral Q6H PRN Ivor Costa, MD       Or  . acetaminophen (TYLENOL) suppository 650 mg  650 mg Rectal Q6H PRN Ivor Costa, MD      . aspirin chewable tablet 81 mg  81 mg Oral Daily Ivor Costa, MD   81 mg at 03/05/19 1033  . clopidogrel (PLAVIX) tablet 75 mg  75 mg Oral Daily Ivor Costa, MD  75 mg at 03/05/19 1033  . enoxaparin (LOVENOX) injection 40 mg  40 mg Subcutaneous Q24H Ivor Costa, MD   40 mg at 03/05/19 0500  . hydrALAZINE (APRESOLINE) injection 5 mg  5 mg Intravenous Q2H PRN Ivor Costa, MD      . loratadine (CLARITIN) tablet 10 mg  10 mg Oral Daily PRN Ivor Costa, MD      . Melatonin TABS 3 mg  3 mg Oral QHS PRN Ivor Costa, MD      . memantine (NAMENDA XR) 24 hr capsule 7 mg  7 mg Oral Daily Ivor Costa, MD   7 mg at 03/05/19 1033  . nitroGLYCERIN  (NITROSTAT) SL tablet 0.4 mg  0.4 mg Sublingual Q5 min PRN Ivor Costa, MD      . pantoprazole (PROTONIX) EC tablet 40 mg  40 mg Oral Daily Ivor Costa, MD   40 mg at 03/05/19 1033     Discharge Medications: Please see discharge summary for a list of discharge medications.  Relevant Imaging Results:  Relevant Lab Results:   Additional Information SS#: [id number] will be a long term care. He has medicaid pending.  Pollie Friar, RN

## 2019-03-05 NOTE — Patient Outreach (Addendum)
[facility]) Care Management  03/05/2019  John Blackburn Oct 14, 1945 [id number]   Case Closure/Transition to [facility] Health/CCI  Referral Date:06/22/2018 Referral Source:Transfer from [place] Reason for Referral:Continued Disease Management Education Insurance:[employer]   Outreach:  Patient case has been transitioned to [facility] Health/CCI for further Care Management assistance.  [place] [facility] is working with patient and son.  Plan: RN Health Coach will close case at this time. RN Health Coach will send primary care provider Care Management Case Closure Letter. RN Health Coach will keep patient active with [facility] Management at this time while [facility] Social Worker is working with.  [place] [phone] [email or web address]

## 2019-03-05 NOTE — Consult Note (Signed)
   [facility] Inpatient Consult   03/05/2019  John Blackburn September 10, 1945 [id number]    Patient is currently active with [facility] Care Management. Our community based plan of care has focused on disease management and community resource support.Patient has been engaged by [facility] SW for assistance with community resources prior to admission.  Plan: Patient will be followed by an external care management team with [facility] for disease care management services with [employer].   Alerted [facility] social workerof hospitalization/ possible disposition.   For additional questions,please contact:  Andreyah Natividad A. Damien Batty, BSN, RN-BC [facility] Liaison Cell: [phone]

## 2019-03-05 NOTE — TOC Progression Note (Signed)
Transition of Care [facility]) - Progression Note    Patient Details  Name: John Blackburn MRN: [id number] Date of Birth: 1946-02-21  Transition of [facility]) CM/SW Contact  Pollie Friar, RN Phone Number: 03/05/2019, 1:49 PM  Clinical Narrative:    [facility] met with patient and he is agreeable to SNF rehab. He asked to be faxed out in the [facility] area. He was in agreement with TOC reaching out to his son: [name]. TOC has left [name] a message.  TOC following.   Expected Discharge Plan: [place] Barriers to Discharge: Continued Medical Work up  Expected Discharge Plan and Services Expected Discharge Plan: [place]       Living arrangements for the past 2 months: Apartment(ground level)                                       Social Determinants of Health (SDOH) Interventions    Readmission Risk Interventions No flowsheet data found.

## 2019-03-06 LAB — BASIC METABOLIC PANEL
Anion gap: 10 (ref 5–15)
BUN: 16 mg/dL (ref 8–23)
CO2: 26 mmol/L (ref 22–32)
Calcium: 9.4 mg/dL (ref 8.9–10.3)
Chloride: 101 mmol/L (ref 98–111)
Creatinine, Ser: 0.82 mg/dL (ref 0.61–1.24)
GFR calc Af Amer: >60 mL/min (ref >60)
GFR calc non Af Amer: >60 mL/min (ref >60)
Glucose, Bld: 110 mg/dL — ABNORMAL HIGH (ref 70–99)
Potassium: 3.9 mmol/L (ref 3.5–5.1)
Sodium: 137 mmol/L (ref 135–145)

## 2019-03-06 MED ORDER — AMLODIPINE BESYLATE 5 MG PO TABS: 5.0000 mg | ORAL_TABLET | Freq: Every day | ORAL | 11 refills | Status: DC

## 2019-03-06 MED ORDER — LORATADINE 10 MG PO TABS: 10.0000 mg | ORAL_TABLET | Freq: Every day | ORAL | Status: AC | PRN

## 2019-03-06 NOTE — Progress Notes (Signed)
Patient placed on CPAP and tolerating well at this time. No issues

## 2019-03-06 NOTE — Discharge Summary (Addendum)
Physician Discharge Summary  John Blackburn DTO:[id number] DOB: 08-Mar-1946 DOA: 03/03/2019  PCP: Hoyt Koch, MD  Admit date: 03/03/2019 Discharge date: 03/06/2019  Admitted From: Home Disposition: To SNF Recommendations for Outpatient Follow-up:  1. Follow up with PCP in 1-2 weeks 2. Please obtain BMP/CBC in one week  Home Health none Equipment/Devices: None  Discharge Condition: Stable CODE STATUS full code Diet recommendation: Cardiac Brief/Interim Summary:72 y.o.malewith medical history significant ofhypertension, hyperlipidemia, GERD, depression, CAD, stent placement, PE 2014 (not onanticoagulants currently),OSA on CPAP,dCHF, who presents with generalized weakness.  Per report, pt's son reported to EDP thatpatient has been declining recently. Patient has generalized weakness, decreased mobility, decreased appetite.Last week,patient did have a fall and hit his head and [facility] negative head CT.Pt is so week thathe has difficulty moving from chair to bed without help. Patient denies any chest pain, shortness breath.He has mild dry cough. No fever or chills. Has nausea, butno vomiting, diarrhea or abdominal pain. Patient has increased urinary frequency per his son's report.Patient denies unilateral numbness or tingling inhis extremities. No facial droop or slurred speech.Family is currently working to get the patient into a memory care unit but is still filling out paperwork.  ED Course:pt was found to have negative troponin x2, negative urinalysis, negative COVID-19 test, potassium 2.5, renal function normal, temperature 99.2, blood pressure 156/101,heart rate 73, oxygen saturation 93 to 97% on room air. 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 Pt isadmitted to telemetry bed as inpatient. ED physician discussed with neurologist,  Dr. Rory Percy, who recommended MRI for brain.  Discharge Diagnoses:  Principal Problem:   Generalized weakness Active Problems:   Hypertension   Hyperlipidemia   Obstructive sleep apnea   Chronic diastolic (congestive) heart failure (HCC)   GERD (gastroesophageal reflux disease)   Memory loss   Hypokalemia   Weakness generalized   CAD (coronary artery disease)  Generalized weakness: Secondary to severe hypokalemia with a K of 2.5 at the time of admission.  No evidence of acute stroke.  MRI of the brain shows- no acute finding advanced chronic small vessel ischemia.  B12 286- LDL 73- hemoglobin A1c 5.4 -TSH 1.17- sed rate 31 -CPK normal. -Patient seen by PT recommends SNF.  HTN:pt is not taking meds at homeincrease Norvasc to 5 mg daily.   Hyperlipidemia: -lipitor  Obstructive sleep apnea: -CPAP  Chronic diastolic (congestive) heart failure (HCC):2D echo on 08/30/2017 showed EF of 55-60% with grade 1 diastolic dysfunction. Patient does not have leg edema or JVD. No shortness of breath. CHF is compensated.   GERD (gastroesophageal reflux disease): -Protonix  Memory loss: -Namenda  Hx ofCAD (coronary artery disease):s/p of stent. No chest pain. Trop negative x 2 -Continue Lipitor, aspirin, Plavix -PRN nitroglycerin  Depression and anxiety: -Continue Lexapro and Wellbutrin  Estimated body mass index is 30.59 kg/m as calculated from the following:   Height as of this encounter: 6' (1.829 m).   Weight as of this encounter: 102.3 kg.  Discharge Instructions  Discharge Instructions    Call MD for:  difficulty breathing, headache or visual disturbances   Complete by: As directed    Call MD for:  persistant nausea and vomiting   Complete by: As directed    Call MD for:  severe uncontrolled pain   Complete by: As directed    Diet - low sodium heart healthy   Complete by: As  directed    Increase activity slowly   Complete by: As directed       Allergies as of 03/06/2019      Reactions   Cephalexin Rash   Levofloxacin Rash      Medication List    TAKE these medications   aspirin 81 MG EC tablet Take 1 tablet (81 mg total) by mouth daily.   atorvastatin 80 MG tablet Commonly known as: LIPITOR Take 1 tablet (80 mg total) by mouth daily at 6 PM. KEEP OV. What changed: when to take this   buPROPion 300 MG 24 hr tablet Commonly known as: WELLBUTRIN XL TAKE 1 TABLET BY MOUTH ONCE DAILY   clopidogrel 75 MG tablet Commonly known as: PLAVIX Take 1 tablet (75 mg total) by mouth daily. Patient will be scheduled for AUGUST 2020.   escitalopram 10 MG tablet Commonly known as: LEXAPRO Take 1 tablet by mouth once daily   loratadine 10 MG tablet Commonly known as: CLARITIN Take 1 tablet (10 mg total) by mouth daily as needed for allergies.   memantine 7 MG Cp24 24 hr capsule Commonly known as: NAMENDA XR TAKE 1  BY MOUTH ONCE DAILY What changed: See the new instructions.   nitroGLYCERIN 0.4 MG SL tablet Commonly known as: Nitrostat Place 1 tablet (0.4 mg total) under the tongue every 5 (five) minutes as needed.   pantoprazole 40 MG tablet Commonly known as: PROTONIX Take 1 tablet (40 mg total) by mouth daily. Patient will be scheduled for AUGUST 2020.      Follow-up Information    Hoyt Koch, MD Follow up.   Specialty: Internal Medicine Contact information: [place] [address] [phone]        Lorretta Harp, MD .   Specialties: Cardiology, Radiology Contact information: [address] [place] [address] [place] [address] [phone]          Allergies  Allergen Reactions  . Cephalexin Rash  . Levofloxacin Rash    Consultations:  None   Procedures/Studies: Ct Head Wo Contrast  Result Date: 03/03/2019 CLINICAL DATA:  Altered level of consciousness, history coronary artery disease, hypertension EXAM: CT HEAD WITHOUT CONTRAST TECHNIQUE: Contiguous axial  images were obtained from the base of the skull through the vertex without intravenous contrast. Sagittal and coronal MPR images reconstructed from axial data set. COMPARISON:  02/21/2019 FINDINGS: Brain: Generalized atrophy. Normal ventricular morphology. No midline shift or mass effect. Small vessel chronic ischemic changes of deep cerebral white matter. Old lacunar infarcts at the basal ganglia and LEFT thalamus with extension into RIGHT periventricular white matter. No intracranial hemorrhage or mass lesion. Subtle area of new low-attenuation at the LEFT occipital lobe suspicious for acute infarct. No extra-axial fluid collections. Vascular: Mild atherosclerotic calcifications within vertebral and carotid arteries at skull base. Skull: Grossly intact, scattered motion artifacts noted Sinuses/Orbits: Clear Other: N/A IMPRESSION: Atrophy with small vessel chronic ischemic changes of deep cerebral white matter. 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 New area of subtle low attenuation in the LEFT occipital lobe suspicious for acute infarct. No intracranial hemorrhage. Electronically Signed   By: Lavonia Dana M.D.   On: 03/03/2019 22:56   Ct Head Wo Contrast  Result Date: 02/21/2019 CLINICAL DATA:  Fall.  Head injury. EXAM: CT HEAD WITHOUT CONTRAST CT CERVICAL SPINE WITHOUT CONTRAST TECHNIQUE: Multidetector CT imaging of the head and cervical spine was performed following the standard protocol without intravenous contrast. Multiplanar CT image reconstructions of the cervical spine were also generated.  COMPARISON:  CT head 04/23/2018 FINDINGS: CT HEAD FINDINGS Brain: Generalized atrophy. Chronic microvascular ischemic change throughout the white matter and basal ganglia. No acute infarct. Negative for hemorrhage or mass. No midline shift. No change from the prior. Vascular: Negative for hyperdense vessel. Atherosclerotic calcification. Skull: Negative for skull fracture Sinuses/Orbits: Mild mucosal edema paranasal sinuses.  Negative orbit. Other: None CT CERVICAL SPINE FINDINGS Alignment: Mild retrolisthesis C3-4 and C4-5 Skull base and vertebrae: Negative for fracture Soft tissues and spinal canal: Negative Disc levels: Multilevel disc degeneration and spurring in the cervical spine. Multilevel spinal and foraminal stenosis due to spurring. Upper chest: Negative Other: None IMPRESSION: 1. No acute intracranial abnormality. Atrophy with extensive chronic microvascular ischemia 2. Negative for cervical spine fracture. Moderate cervical spondylosis. Electronically Signed   By: Franchot Gallo M.D.   On: 02/21/2019 18:44   Ct Cervical Spine Wo Contrast  Result Date: 02/21/2019 CLINICAL DATA:  Fall.  Head injury. EXAM: CT HEAD WITHOUT CONTRAST CT CERVICAL SPINE WITHOUT CONTRAST TECHNIQUE: Multidetector CT imaging of the head and cervical spine was performed following the standard protocol without intravenous contrast. Multiplanar CT image reconstructions of the cervical spine were also generated. COMPARISON:  CT head 04/23/2018 FINDINGS: CT HEAD FINDINGS Brain: Generalized atrophy. Chronic microvascular ischemic change throughout the white matter and basal ganglia. No acute infarct. Negative for hemorrhage or mass. No midline shift. No change from the prior. Vascular: Negative for hyperdense vessel. Atherosclerotic calcification. Skull: Negative for skull fracture Sinuses/Orbits: Mild mucosal edema paranasal sinuses. Negative orbit. Other: None CT CERVICAL SPINE FINDINGS Alignment: Mild retrolisthesis C3-4 and C4-5 Skull base and vertebrae: Negative for fracture Soft tissues and spinal canal: Negative Disc levels: Multilevel disc degeneration and spurring in the cervical spine. Multilevel spinal and foraminal stenosis due to spurring. Upper chest: Negative Other: None IMPRESSION: 1. No acute intracranial abnormality. Atrophy with extensive chronic microvascular ischemia 2. Negative for cervical spine fracture. Moderate cervical  spondylosis. Electronically Signed   By: Franchot Gallo M.D.   On: 02/21/2019 18:44   Mr Brain Wo Contrast  Result Date: 03/04/2019 CLINICAL DATA:  Focal neuro deficit EXAM: MRI HEAD WITHOUT CONTRAST TECHNIQUE: Multiplanar, multiecho pulse sequences of the brain and surrounding structures were obtained without intravenous contrast. COMPARISON:  Head CT from yesterday.  Brain MRI 07/01/2018 FINDINGS: Brain: No acute infarction, hemorrhage, hydrocephalus, extra-axial collection or mass lesion. Advanced chronic small vessel ischemia with confluent gliosis in the deep white matter and across the bilateral pons. There are accentuated dilated perivascular spaces with remote bilateral thalamic infarcts and a right lateral lenticulostriate basal ganglia infarct. Chronic blood products in the right parietal lobe. Mild cerebral volume loss. Vascular: Major flow voids are preserved. Skull and upper cervical spine: Negative for marrow lesion Sinuses/Orbits: Negative IMPRESSION: 1. No acute finding. 2. Advanced chronic small vessel ischemia. Electronically Signed   By: Monte Fantasia M.D.   On: 03/04/2019 07:01   Dg Chest Port 1 View  Result Date: 03/03/2019 CLINICAL DATA:  Cough EXAM: PORTABLE CHEST 1 VIEW COMPARISON:  04/23/2018 FINDINGS: Heart is borderline in size. No confluent airspace opacities. Blunting of the left costophrenic angle may reflect small left effusion or pleural thickening. IMPRESSION: Small left effusion versus pleural thickening. Electronically Signed   By: Rolm Baptise M.D.   On: 03/03/2019 22:58   Dg Knee Complete 4 Views Left  Result Date: 02/21/2019 CLINICAL DATA:  Fall.  Pain EXAM: LEFT KNEE - COMPLETE 4+ VIEW COMPARISON:  None. FINDINGS: Total knee replacement in satisfactory position alignment.  No fracture or joint effusion. IMPRESSION: No acute abnormality. Electronically Signed   By: Franchot Gallo M.D.   On: 02/21/2019 18:46   Dg Hip Unilat W Or Wo Pelvis 2-3 Views Left  Result  Date: 02/21/2019 CLINICAL DATA:  Fall.  Left hip pain EXAM: DG HIP (WITH OR WITHOUT PELVIS) 2-3V LEFT COMPARISON:  None. FINDINGS: Negative for fracture. Mild degenerative change in the left hip and moderate degenerative change in the right hip. Degenerative change in the lumbar spine. IMPRESSION: Negative for fracture. Electronically Signed   By: Franchot Gallo M.D.   On: 02/21/2019 18:45   (Echo, Carotid, EGD, Colonoscopy, ERCP)    Subjective:  Patient resting in bed reports he feels better than 2 days ago anxious to go to rehab Discharge Exam: Vitals:   03/06/19 0325 03/06/19 0722  BP: (!) 169/104 (!) 169/100  Pulse: 82 81  Resp: 17 18  Temp: 98.5 F (36.9 C) 98.2 F (36.8 C)  SpO2: 98% 95%   Vitals:   03/06/19 0108 03/06/19 0115 03/06/19 0325 03/06/19 0722  BP: (!) 161/101 (!) 159/99 (!) 169/104 (!) 169/100  Pulse: 80 79 82 81  Resp: 17  17 18   Temp: 98.5 F (36.9 C)  98.5 F (36.9 C) 98.2 F (36.8 C)  TempSrc: Oral  Oral Oral  SpO2: 97%  98% 95%  Weight:      Height:        General: Pt is alert, awake, not in acute distress Cardiovascular: RRR, S1/S2 +, no rubs, no gallops Respiratory: CTA bilaterally, no wheezing, no rhonchi Abdominal: Soft, NT, ND, bowel sounds + Extremities: no edema, no cyanosis    The results of significant diagnostics from this hospitalization (including imaging, microbiology, ancillary and laboratory) are listed below for reference.     Microbiology: Recent Results (from the past 240 hour(s))  Blood culture (routine x 2)     Status: None (Preliminary result)   Collection Time: 03/03/19 10:20 PM   Specimen: BLOOD LEFT FOREARM  Result Value Ref Range Status   Specimen Description   Final    BLOOD LEFT FOREARM Performed at [facility], [address] [place], [place] [address]    Special Requests   Final    BOTTLES DRAWN AEROBIC AND ANAEROBIC Blood Culture adequate volume Performed at [facility], [address] [place], [place] [address]    Culture   Final    NO GROWTH 2 DAYS Performed at [facility], [place] [address] [place], [place] [address]    Report Status PENDING  Incomplete  Blood culture (routine x 2)     Status: None (Preliminary result)   Collection Time: 03/03/19 10:20 PM   Specimen: BLOOD RIGHT ARM  Result Value Ref Range Status   Specimen Description   Final    BLOOD RIGHT ARM Performed at [facility], [address] [place], [place] [address]    Special Requests   Final    BOTTLES DRAWN AEROBIC AND ANAEROBIC Blood Culture adequate volume Performed at [facility], [address] [place], [place] [address]    Culture   Final    NO GROWTH 2 DAYS Performed at [facility], [place] [address], [place], [place] [address]    Report Status PENDING  Incomplete  SARS Coronavirus 2 ([facility] order,Performed in [facility] lab via Abbott ID)     Status: None   Collection Time: 03/03/19 10:24  PM   Specimen: Dry Nasal Swab (Abbott ID Now)  Result Value Ref Range Status   SARS Coronavirus 2 (Abbott ID Now) NEGATIVE NEGATIVE Final    Comment: (NOTE) Interpretive Result Comment(s): COVID 19 Positive SARS CoV 2 target nucleic acids are DETECTED. The SARS CoV 2 RNA is generally detectable in upper and lower respiratory specimens during the acute phase of infection.  Positive results are indicative of active infection with SARS CoV 2.  Clinical correlation with patient history and other diagnostic information is necessary to determine patient infection status.  Positive results do not rule out bacterial infection or coinfection with other viruses. The expected result is Negative. COVID 19 Negative SARS CoV 2 target nucleic acids are NOT DETECTED. The SARS CoV 2 RNA is generally detectable in upper and lower respiratory specimens during the acute phase of infection.  Negative results do not preclude SARS CoV 2 infection, do not rule out coinfections  with other pathogens, and should not be used as the sole basis for treatment or other patient management decisions.  Negative results must be combined with clinical  observations, patient history, and epidemiological information. The expected result is Negative. Invalid Presence or absence of SARS CoV 2 nucleic acids cannot be determined. Repeat testing was performed on the submitted specimen and repeated Invalid results were obtained.  If clinically indicated, additional testing on a new specimen with an alternate test methodology [phone]) is advised.  The SARS CoV 2 RNA is generally detectable in upper and lower respiratory specimens during the acute phase of infection. The expected result is Negative. Fact Sheet for Patients:  GolfingFamily.no Fact Sheet for Healthcare Providers: https://www.hernandez-brewer.com/ This test is not yet approved or cleared by the [place] FDA and has been authorized for detection and/or diagnosis of SARS CoV 2 by FDA under an Emergency Use Authorization (EUA).  This EUA will remain in effect (meaning this test can be used) for the duration of the COVID19 d eclaration under Section 564(b)(1) of the Act, 21 U.S.C. section [phone] 3(b)(1), unless the authorization is terminated or revoked sooner. Performed at [facility], [address] [place], [place] [address]   Urine Culture     Status: None   Collection Time: 03/03/19 11:40 PM   Specimen: Urine, Random  Result Value Ref Range Status   Specimen Description   Final    URINE, RANDOM Performed at [facility], [address] [place], [place] [address]    Special Requests   Final    NONE Performed at [facility], [address] [place], [place] [address]    Culture   Final    NO GROWTH Performed at [facility], [place] [address] [place], [place] [address]    Report Status 03/05/2019 FINAL  Final      Labs: BNP (last 3 results) Recent Labs    03/04/19 0455  BNP 939.0*   Basic Metabolic Panel: Recent Labs  Lab 03/03/19 2307 03/04/19 0455 03/04/19 1357 03/05/19 0413 03/06/19 0518  NA 138 138 140 140 137  K 2.5* 3.3* 3.8 4.2 3.9  CL 105 101 103 106 101  CO2 25 27 28 25 26   GLUCOSE 161* 104* 150* 103* 110*  BUN 21 18 16 13 16   CREATININE 0.82 0.84 0.89 0.83 0.82  CALCIUM 7.4* 8.8* 8.9 8.9 9.4  MG 1.9  --   --  2.1  --    Liver Function Tests: Recent Labs  Lab 03/03/19 2224 03/03/19 2307  AST 28 20  ALT 20 15  ALKPHOS 70 61  BILITOT 1.3* 0.8  PROT 7.1 5.7*  ALBUMIN 3.3* 2.8*   No results for input(s): LIPASE, AMYLASE in the last 168 hours. No results for input(s): AMMONIA in the last 168 hours. CBC: Recent Labs  Lab 03/03/19 2224 03/04/19 0455 03/04/19 1116  WBC 10.0 9.7  --   NEUTROABS 7.7  --   --   HGB 15.0 14.3  --   HCT 44.9 42.9 41.1  MCV 99.1 97.9  --   PLT 248 239  --    Cardiac Enzymes: Recent Labs  Lab 03/04/19 1116  CKTOTAL 53   BNP: Invalid input(s): POCBNP CBG: No results for input(s): GLUCAP in the last 168 hours. D-Dimer No results for input(s): DDIMER in the last 72 hours. Hgb A1c Recent Labs    03/05/19 0413  HGBA1C 5.4   Lipid Profile Recent Labs    03/05/19 0413  CHOL 111  HDL 27*  LDLCALC 73  TRIG 53  CHOLHDL 4.1   Thyroid function studies Recent Labs    03/04/19 0455  TSH 1.177   Anemia work up Recent Labs    03/04/19 1116  VITAMINB12 286   Urinalysis    Component Value Date/Time   COLORURINE YELLOW 03/03/2019 [place] 03/03/2019 2340   LABSPEC 1.025 03/03/2019 2340   PHURINE 6.0 03/03/2019 2340   GLUCOSEU NEGATIVE 03/03/2019 2340   HGBUR TRACE (A) 03/03/2019 2340   BILIRUBINUR NEGATIVE 03/03/2019 2340   BILIRUBINUR negative 09/13/2016 1816   BILIRUBINUR neg 12/06/2011 1743   KETONESUR NEGATIVE 03/03/2019 2340   PROTEINUR 30 (A) 03/03/2019 2340   UROBILINOGEN 0.2  09/13/2016 1816   NITRITE NEGATIVE 03/03/2019 2340   LEUKOCYTESUR NEGATIVE 03/03/2019 2340   Sepsis Labs Invalid input(s): PROCALCITONIN,  WBC,  LACTICIDVEN Microbiology Recent Results (from the past 240 hour(s))  Blood culture (routine x 2)     Status: None (Preliminary result)   Collection Time: 03/03/19 10:20 PM   Specimen: BLOOD LEFT FOREARM  Result Value Ref Range Status   Specimen Description   Final    BLOOD LEFT FOREARM Performed at [facility], [address] [place], [place] [address]    Special Requests   Final    BOTTLES DRAWN AEROBIC AND ANAEROBIC Blood Culture adequate volume Performed at [facility], [address] [place], [place] [address]    Culture   Final    NO GROWTH 2 DAYS Performed at [facility], [place] [address] [place], [place] [address]    Report Status PENDING  Incomplete  Blood culture (routine x 2)     Status: None (Preliminary result)   Collection Time: 03/03/19 10:20 PM   Specimen: BLOOD RIGHT ARM  Result Value Ref Range Status   Specimen Description   Final    BLOOD RIGHT ARM Performed at [facility], [address] [place], [place] [address]    Special Requests   Final    BOTTLES DRAWN AEROBIC AND ANAEROBIC Blood Culture adequate volume Performed at [facility], [address] [place], [place] [address]    Culture   Final    NO GROWTH 2 DAYS Performed at [facility], [place] [address] [place], [place] [address]    Report Status PENDING  Incomplete  SARS Coronavirus 2 (Hosp order,Performed in [facility] lab via Abbott ID)  Status: None   Collection Time: 03/03/19 10:24 PM   Specimen: Dry Nasal Swab (Abbott ID Now)  Result Value Ref Range Status   SARS Coronavirus 2 (Abbott ID Now) NEGATIVE NEGATIVE Final    Comment: (NOTE) Interpretive Result Comment(s): COVID 19 Positive SARS CoV 2 target nucleic acids are DETECTED. The SARS CoV 2 RNA is generally detectable in upper  and lower respiratory specimens during the acute phase of infection.  Positive results are indicative of active infection with SARS CoV 2.  Clinical correlation with patient history and other diagnostic information is necessary to determine patient infection status.  Positive results do not rule out bacterial infection or coinfection with other viruses. The expected result is Negative. COVID 19 Negative SARS CoV 2 target nucleic acids are NOT DETECTED. The SARS CoV 2 RNA is generally detectable in upper and lower respiratory specimens during the acute phase of infection.  Negative results do not preclude SARS CoV 2 infection, do not rule out coinfections with other pathogens, and should not be used as the sole basis for treatment or other patient management decisions.  Negative results must be combined with clinical  observations, patient history, and epidemiological information. The expected result is Negative. Invalid Presence or absence of SARS CoV 2 nucleic acids cannot be determined. Repeat testing was performed on the submitted specimen and repeated Invalid results were obtained.  If clinically indicated, additional testing on a new specimen with an alternate test methodology [phone]) is advised.  The SARS CoV 2 RNA is generally detectable in upper and lower respiratory specimens during the acute phase of infection. The expected result is Negative. Fact Sheet for Patients:  GolfingFamily.no Fact Sheet for Healthcare Providers: https://www.hernandez-brewer.com/ This test is not yet approved or cleared by the [place] FDA and has been authorized for detection and/or diagnosis of SARS CoV 2 by FDA under an Emergency Use Authorization (EUA).  This EUA will remain in effect (meaning this test can be used) for the duration of the COVID19 d eclaration under Section 564(b)(1) of the Act, 21 U.S.C. section [phone] 3(b)(1), unless the  authorization is terminated or revoked sooner. Performed at [facility], [address], [place], [place] [address]   Urine Culture     Status: None   Collection Time: 03/03/19 11:40 PM   Specimen: Urine, Random  Result Value Ref Range Status   Specimen Description   Final    URINE, RANDOM Performed at [facility], [address] [place], [place] [address]    Special Requests   Final    NONE Performed at [facility], [address] [place], [place] [address]    Culture   Final    NO GROWTH Performed at [facility], [place] [address] [place], [place] [address]    Report Status 03/05/2019 FINAL  Final     Time coordinating discharge:  36 minutes  SIGNED:   Georgette Shell, MD  [facility] 03/06/2019, 10:52 AM Pager   If 7PM-7AM, please contact night-coverage [email or web address] Password [email or web address]

## 2019-03-06 NOTE — Progress Notes (Signed)
OT  Note  Patient Details Name: John Blackburn MRN: [id number] DOB: 1945-12-26   OT order received.  Pt is already on OT caseload.  Will continue to follow according to POC.  Lucille Passy, OTR/L Acute Rehabilitation Services Pager [phone] Office [phone]   Lucille Passy M 03/06/2019, 5:29 PM

## 2019-03-07 ENCOUNTER — Other Ambulatory Visit: Payer: Self-pay | Admitting: *Deleted

## 2019-03-07 NOTE — TOC Progression Note (Addendum)
Transition of Care [facility]) - Progression Note    Patient Details  Name: John Blackburn MRN: [id number] Date of Birth: 12/07/1945  Transition of Care [facility]) CM/SW Contact  Eileen Stanford, LCSW Phone Number: 03/07/2019, 1:52 PM  Clinical Narrative: MD has completed Peer to Peer and the denial stands. Pt's family has to option to appeal. [facility] is faxed letter to [place]. CSW called pt's son to explain and offer option to appeal--had to leave voicemail, awaiting call back.    3:15 Pt's son will do the appeal via phone, denial letter emailed to pt's son.    Expected Discharge Plan: [place] Barriers to Discharge: Continued Medical Work up  Expected Discharge Plan and Services Expected Discharge Plan: [place] arrangements for the past 2 months: Apartment(ground level) Expected Discharge Date: 03/06/19                                     Social Determinants of Health (SDOH) Interventions    Readmission Risk Interventions No flowsheet data found.

## 2019-03-07 NOTE — Patient Outreach (Signed)
[facility]) Care Management  03/07/2019  John Blackburn 07-11-1946 [id number]   CSW received an urgent message from patient's son, Dejuan Elman today, requesting that CSW return his call at her earliest convenience, preferably today.  CSW is currently covering for LCSW colleague, Eduard Clos, also with [employer], and will notify her of CSW's conversation with Mr. Fahs and request that she follow-up with Mr. Lebeau on Monday, March 11, 2019.  CSW will outreach to Mr. Greenstreet today to see how CSW can be of assistance, in the meantime.  In talking with Mr. Mahl, he reported that he received a call from patient's Inpatient Hospital Social Worker, [place], indicating that [facility] has denied her request for rehabilitative services for patient in a skilled nursing facility.  Despite inpatient physical therapy and occupational therapy notes recommending skilled nursing level of care, and patient's attending physician completing a peer-to-peer review with an attending physician at [facility], patient continues to be denied for skilled nursing placement, due to his diagnosis of Dementia.  Mr. Devaul reported that patient is supposed to be discharged from the hospital today, but is unsafe to return home to live alone and try to perform activities of daily living independently.  Mr. Stmarie was encouraged to contact [employer] to do an appeal over the phone, but Mr. Sitzmann felt that this would be a waste of time, stating "[employer] has already made up their mind".  CSW agreed to contact [employer] on behalf of Mr. Bosque, but he did not believe this would be necessary.  Mr. Bidinger indicated that he has already applied for Long-Term Care Medicaid for patient, at the [facility], as of March 01, 2019, and that patient's application is still pending.  CSW agreed to  contact Mrs. Cobb to see if she would be able to obtain a Letter of Guarantee for patient.  Mr. Wilner reported that he would like for patient to be placed at [facility] or [facility], both skilled nursing facilities, and believes that bed offers have already been granted, pending [employer] approval.  CSW encouraged Mrs. Cobb to follow-up with Mr. Dotzler directly, to inform him of whether or not she is able to obtain a Letter of Guarantee for patient.  CSW also encouraged Mr. Isidore to contact CSW if he has additional questions/concerns or requires more social work assistance.  Mr. Barnhill voiced understanding and was agreeable to this plan.  Mr. Nunziato is aware that [place] [facility] will be closed on Friday, March 08, 2019, in observance of Independence Day.  Nat Christen, BSW, MSW, LCSW  Licensed Education officer, [facility]  [address] [place] [address], [place], [place] [address] Physical Address-[address], [place], [place] [address] Toll Free Main # [phone] Fax # [phone] Cell # [phone]  Office # [phone] [email or web address]

## 2019-03-07 NOTE — Evaluation (Addendum)
Physical Therapy Evaluation Patient Details Name: John Blackburn MRN: [id number] DOB: 1946-02-07 Today's Date: 03/07/2019   History of Present Illness  Patient is a 73 year old male. Per notes sons report that over the past 2 weeks the patient has had increasing weakness. He is confused today. 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 PMH: PE, sleep apnea, skin cancer, MI, depression, GERD, HTN, allergies, OA, bilateral knee replacements   Clinical Impression  Patient requires significant assist for mobility. He was only able to take side streps up the edge of the bed. He required mod assist for bed mobility. He has some confusion. At this time he may benefit most from rehab at a SNF. He only has intermittent help at home.     Follow Up Recommendations SNF 24 hour assist     Equipment Recommendations  None recommended by PT    Recommendations for Other Services Rehab consult     Precautions / Restrictions Precautions Precautions: Fall Restrictions Weight Bearing Restrictions: No      Mobility  Bed Mobility Overal bed mobility: Needs Assistance Bed Mobility: Supine to Sit     Supine to sit: Mod assist     General bed mobility comments: mod a to the edge of the bed. Patient required min a to remain sitting.   Transfers Overall transfer level: Needs assistance Equipment used: Rolling walker (2 wheeled) Transfers: Sit to/from Stand Sit to Stand: Mod assist         General transfer comment: mod a to stand for stability   Ambulation/Gait Ambulation/Gait assistance: Mod assist   Assistive device: Rolling walker (2 wheeled)   Gait velocity: decreased   General Gait Details: took side steps up the edge of the bed. Heavey posterior lean. Mod verbal and tactile cuing to stay in the walker   Stairs            Wheelchair Mobility    Modified Rankin (Stroke Patients Only) Modified Rankin (Stroke Patients Only) Pre-Morbid Rankin Score: Slight  disability Modified Rankin: Moderately severe disability     Balance Overall balance assessment: Needs assistance Sitting-balance support: Bilateral upper extremity supported;Feet supported Sitting balance-Leahy Scale: Poor Sitting balance - Comments: ferequent assist to not fall backwards    Standing balance support: Bilateral upper extremity supported Standing balance-Leahy Scale: Poor Standing balance comment: mod a toremain standint                              Pertinent Vitals/Pain      Home Living Family/patient expects to be discharged to:: Skilled nursing facility Living Arrangements: Alone Available Help at Discharge: Family;Available PRN/intermittently Type of Home: House Home Access: Level entry     Home Layout: One level Home Equipment: Walker - 2 wheels      Prior Function Level of Independence: Needs assistance   Gait / Transfers Assistance Needed: over the past few weeks he has required increased assistance to transfer   ADL's / Homemaking Assistance Needed: prior to decline was able to do on his own  Comments: Most information comes from ntoes. Patient can answer some simple questions but appears confused      Hand Dominance        Extremity/Trunk Assessment             Cervical / Trunk Assessment Cervical / Trunk Assessment: Normal  Communication   Communication: No difficulties  Cognition Arousal/Alertness: Awake/alert Behavior During Therapy: Restless Overall Cognitive Status:  Impaired/Different from baseline Area of Impairment: Orientation;Attention;Memory;Following commands;Safety/judgement                 Orientation Level: Person Current Attention Level: Sustained Memory: Decreased recall of precautions;Decreased short-term memory Following Commands: Follows one step commands inconsistently Safety/Judgement: Decreased awareness of safety;Decreased awareness of deficits     General Comments: Patient had  puloled heart monitor off. He was pill rolling with his left hand. He was pleasent but confused. He could say his name but not his date of birth. He did not know hwich day it was       General Comments      Exercises     Assessment/Plan    PT Assessment Patient needs continued PT services  PT Problem List Decreased strength;Decreased activity tolerance;Decreased balance;Decreased mobility;Decreased coordination;Decreased knowledge of use of DME;Decreased safety awareness       PT Treatment Interventions DME instruction;Gait training;Functional mobility training;Therapeutic activities;Therapeutic exercise;Neuromuscular re-education;Patient/family education    PT Goals (Current goals can be found in the Care Plan section)  Acute Rehab PT Goals Patient Stated Goal: unable to state  PT Goal Formulation: With patient Time For Goal Achievement: 03/11/19 Potential to Achieve Goals: Fair    Frequency Min 2X/week   Barriers to discharge Decreased caregiver support      Co-evaluation               AM-PAC PT "6 Clicks" Mobility  Outcome Measure Help needed turning from your back to your side while in a flat bed without using bedrails?: A Lot Help needed moving from lying on your back to sitting on the side of a flat bed without using bedrails?: A Lot Help needed moving to and from a bed to a chair (including a wheelchair)?: A Lot Help needed standing up from a chair using your arms (e.g., wheelchair or bedside chair)?: A Lot Help needed to walk in hospital room?: A Lot Help needed climbing 3-5 steps with a railing? : Total 6 Click Score: 11    End of Session Equipment Utilized During Treatment: Gait belt Activity Tolerance: Patient limited by fatigue Patient left: in bed;with call bell/phone within reach;with bed alarm set(no chair alarm pads available on the floor ) Nurse Communication: Mobility status PT Visit Diagnosis: Other abnormalities of gait and mobility  (R26.89);Muscle weakness (generalized) (M62.81);Difficulty in walking, not elsewhere classified (R26.2)    Time:  -      Charges:  Moderate Complexity eval  PT Evaluation $PT Eval Moderate Complexity: 1 Mod            Carney Living PT DPT  03/07/2019, 8:56 AM

## 2019-03-07 NOTE — Care Management Important Message (Signed)
Important Message  Patient Details  Name: John Blackburn MRN: [id number] Date of Birth: [birth year]   Medicare Important Message Given:  Yes     Orbie Pyo 03/07/2019, 1:34 PM

## 2019-03-07 NOTE — TOC Progression Note (Signed)
Transition of Care [facility]) - Progression Note    Patient Details  Name: John Blackburn MRN: [id number] Date of Birth: 05/24/1946  Transition of Care [facility]) CM/SW Contact  Eileen [facility], LCSW Phone Number: 03/07/2019, 10:02 AM  Clinical Narrative:   Pt received a insurance denial yesterday afternoon. If MD agreeable the next step is Peer to Peer. MD aware.    Expected Discharge Plan: [place] Barriers to Discharge: Continued Medical Work up  Expected Discharge Plan and Services Expected Discharge Plan: [place] arrangements for the past 2 months: Apartment(ground level) Expected Discharge Date: 03/06/19                                     Social Determinants of Health (SDOH) Interventions    Readmission Risk Interventions No flowsheet data found.

## 2019-03-07 NOTE — Progress Notes (Signed)
Physical Therapy Treatment Patient Details Name: John Blackburn MRN: [id number] DOB: 1946/07/13 Today's Date: 03/07/2019    History of Present Illness Patient is a 73 year old male. Per notes sons report that over the past 2 weeks the patient has had increasing weakness. He is confused today. 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 PMH: PE, sleep apnea, skin cancer, MI, depression, GERD, HTN, allergies, OA, bilateral knee replacements     PT Comments    Patient seen for mobility progression. Pt is oriented to person only and with slow processing. Pt presents with  posterior and left lateral bias in sitting and posterior bias in standing. Pt requires max A-max A +2 for bed mobility and sit to stand transfer. Pt appears to have further impaired mobility and more confusion than on initial PT evaluation. Continue to recommend SNF for further skilled PT services to maximize independence and safety with mobility.    Follow Up Recommendations  SNF     Equipment Recommendations  (TBD next venue)    Recommendations for Other Services       Precautions / Restrictions Precautions Precautions: Fall Restrictions Weight Bearing Restrictions: No    Mobility  Bed Mobility Overal bed mobility: Needs Assistance Bed Mobility: Supine to Sit;Sit to Supine     Supine to sit: Max assist Sit to supine: Max assist;+2 for physical assistance   General bed mobility comments: multimodal cues for sequencing and assistance to bring bilat LE/hips to EOB and to elevate trunk into sitting; use of rail  Transfers Overall transfer level: Needs assistance Equipment used: Rolling walker (2 wheeled) Transfers: Sit to/from Stand Sit to Stand: Max assist;+2 physical assistance         General transfer comment: assistance required to power up into standig and to maintain balance once in standing; pt wiht heavy posterior bias  Ambulation/Gait                 Stairs              Wheelchair Mobility    Modified Rankin (Stroke Patients Only) Modified Rankin (Stroke Patients Only) Pre-Morbid Rankin Score: Slight disability Modified Rankin: Severe disability     Balance Overall balance assessment: Needs assistance Sitting-balance support: Bilateral upper extremity supported;Feet supported Sitting balance-Leahy Scale: Zero Sitting balance - Comments: max A required to maintain sitting balance EOB due to posterior bias; pt reports feeling like he's falling forward Postural control: Posterior lean;Left lateral lean Standing balance support: Bilateral upper extremity supported Standing balance-Leahy Scale: Zero                              Cognition Arousal/Alertness: Awake/alert Behavior During Therapy: Flat affect Overall Cognitive Status: Impaired/Different from baseline Area of Impairment: Orientation;Attention;Memory;Following commands;Safety/judgement;Problem solving                 Orientation Level: Disoriented to;Place;Time;Situation Current Attention Level: Sustained Memory: Decreased recall of precautions;Decreased short-term memory Following Commands: Follows one step commands inconsistently;Follows one step commands with increased time Safety/Judgement: Decreased awareness of safety;Decreased awareness of deficits   Problem Solving: Slow processing;Decreased initiation;Difficulty sequencing;Requires verbal cues;Requires tactile cues        Exercises      General Comments General comments (skin integrity, edema, etc.): unable to transfer OOB due to BM once in standing       Pertinent Vitals/Pain Pain Assessment: No/denies pain    Home Living  Prior Function            PT Goals (current goals can now be found in the care plan section) Acute Rehab PT Goals Patient Stated Goal: unable to state  Progress towards PT goals: Progressing toward goals    Frequency    Min  3X/week      PT Plan Frequency needs to be updated    Co-evaluation              AM-PAC PT "6 Clicks" Mobility   Outcome Measure  Help needed turning from your back to your side while in a flat bed without using bedrails?: A Lot Help needed moving from lying on your back to sitting on the side of a flat bed without using bedrails?: A Lot Help needed moving to and from a bed to a chair (including a wheelchair)?: A Lot Help needed standing up from a chair using your arms (e.g., wheelchair or bedside chair)?: A Lot Help needed to walk in hospital room?: Total Help needed climbing 3-5 steps with a railing? : Total 6 Click Score: 10    End of Session Equipment Utilized During Treatment: Gait belt Activity Tolerance: Patient tolerated treatment well Patient left: in bed;with call bell/phone within reach;with bed alarm set Nurse Communication: Mobility status PT Visit Diagnosis: Other abnormalities of gait and mobility (R26.89);Muscle weakness (generalized) (M62.81);Difficulty in walking, not elsewhere classified (R26.2)     Time: 7282-0601 PT Time Calculation (min) (ACUTE ONLY): 16 min  Charges:  $Therapeutic Activity: 8-22 mins                     Earney Navy, PTA Acute Rehabilitation Services Pager: [phone] Office: [phone]     Darliss Cheney 03/07/2019, 1:41 PM

## 2019-03-07 NOTE — Progress Notes (Addendum)
PROGRESS NOTE    John Blackburn  WYO:[id number] DOB: [birth year] DOA: 03/03/2019 PCP: Hoyt Koch, MD  Brief Narrative:   PROGRESS NOTE    John Blackburn  DXA:[id number] DOB: May 06, 1946 DOA: 03/03/2019 PCP: Hoyt Koch, MD    Brief Narrative: 73 y.o. male with medical history significant of hypertension, hyperlipidemia, GERD, depression, CAD, stent placement, PE 2014 (not on anticoagulants currently), OSA on CPAP, dCHF, who presents with generalized weakness.  Per report, pt's son reported to EDP that patient has been declining recently.  Patient has generalized weakness, decreased mobility, decreased appetite. Last week, patient did have a fall and hit his head and was evaluated at [facility] with a negative head CT. Pt is so week that he has difficulty moving from chair to bed without help.  Patient denies any chest pain, shortness breath.  He has mild dry cough.  No fever or chills. Has nausea, but no vomiting, diarrhea or abdominal pain.  Patient has increased urinary frequency per his son's report.  Patient denies unilateral numbness or tingling in his extremities. No facial droop or slurred speech. Family is currently working to get the patient into a memory care unit but is still filling out paperwork.  ED Course: pt was found to have negative troponin x2, negative urinalysis, negative COVID-19 test, potassium 2.5, renal function normal, temperature 99.2, blood pressure 156/101, heart rate 73, oxygen saturation 93 to 97% on room air.  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 Pt is admitted to telemetry bed as inpatient.  ED physician discussed with neurologist, Dr. Rory Percy, who recommended MRI for brain.   Assessment & Plan:   Principal Problem:   Generalized weakness Active Problems:   Hypertension   Hyperlipidemia   Obstructive sleep apnea   Chronic  diastolic (congestive) heart failure (HCC)   GERD (gastroesophageal reflux disease)   Memory loss   Hypokalemia   Weakness generalized   CAD (coronary artery disease)  Generalized weakness: Secondary to severe hypokalemia with a K of 2.5 at the time of admission.  No evidence of acute stroke.  MRI of the brain shows-acute finding advanced chronic small vessel ischemia.  B12 286 LDL 73 hemoglobin A1c 5.4 TSH 1.17 sed rate 31 CPK normal. -Patient seen by PT recommends SNF. Family appealing snf denied  Patient lives alone and is an unsafe dc to home.he is not able to do ADLS without significant assistance  HTN: pt is not taking meds at home  start Norvasc 2.5 mg daily. -IV hydralazine prn  Hyperlipidemia: -lipitor  Obstructive sleep apnea: -CPAP  Chronic diastolic (congestive) heart failure ([place]): 2D echo on 08/30/2017 showed EF of 55-60% with grade 1 diastolic dysfunction.  Patient does not have leg edema or JVD.  No shortness of breath.  CHF is compensated.   GERD (gastroesophageal reflux disease): -Protonix  Memory loss: -Namenda  Hypokalemia: K= 2.5   on admission. Mg 1.9, potassium 4.2   Hx of CAD (coronary artery disease): s/p of stent. No chest pain. Trop negative x 2 -Continue Lipitor, aspirin, Plavix - PRN nitroglycerin  Depression and anxiety:  -hold Lexapro, Wellbutrin due to QTc prolongation    Estimated body mass index is 30.59 kg/m as calculated from the following:   Height as of this encounter: 6' (1.829 m).   Weight as of this encounter: 102.3 kg.   Subjective: Sleeping with cpap on appear comfortable no new  events  Objective: Vitals:   03/07/19 0446 03/07/19 0840 03/07/19 0934 03/07/19 1146  BP: (!) 142/91 (!) 172/99 (!) 144/93 (!) 150/85  Pulse: 79 89  89  Resp: 18 18  18   Temp: 98 F (36.7 C) 98.1 F (36.7 C)  99.2 F (37.3 C)  TempSrc: Oral Oral  Oral  SpO2: 99% 97%  95%  Weight:      Height:        Intake/Output Summary  (Last 24 hours) at 03/07/2019 1458 Last data filed at 03/07/2019 0447 Gross per 24 hour  Intake 480 ml  Output 400 ml  Net 80 ml   Filed Weights   03/03/19 2154 03/04/19 0657  Weight: 100.7 kg 102.3 kg    Data Reviewed: I have personally reviewed following labs and imaging studies  CBC: Recent Labs  Lab 03/03/19 2224 03/04/19 0455 03/04/19 1116  WBC 10.0 9.7  --   NEUTROABS 7.7  --   --   HGB 15.0 14.3  --   HCT 44.9 42.9 41.1  MCV 99.1 97.9  --   PLT 248 239  --    Basic Metabolic Panel: Recent Labs  Lab 03/03/19 2307 03/04/19 0455 03/04/19 1357 03/05/19 0413 03/06/19 0518  NA 138 138 140 140 137  K 2.5* 3.3* 3.8 4.2 3.9  CL 105 101 103 106 101  CO2 25 27 28 25 26   GLUCOSE 161* 104* 150* 103* 110*  BUN 21 18 16 13 16   CREATININE 0.82 0.84 0.89 0.83 0.82  CALCIUM 7.4* 8.8* 8.9 8.9 9.4  MG 1.9  --   --  2.1  --    GFR: Estimated Creatinine Clearance: 100.8 mL/min (by C-G formula based on SCr of 0.82 mg/dL). Liver Function Tests: Recent Labs  Lab 03/03/19 2224 03/03/19 2307  AST 28 20  ALT 20 15  ALKPHOS 70 61  BILITOT 1.3* 0.8  PROT 7.1 5.7*  ALBUMIN 3.3* 2.8*   No results for input(s): LIPASE, AMYLASE in the last 168 hours. No results for input(s): AMMONIA in the last 168 hours. Coagulation Profile: Recent Labs  Lab 03/04/19 0455  INR 1.1   Cardiac Enzymes: Recent Labs  Lab 03/04/19 1116  CKTOTAL 53   BNP (last 3 results) No results for input(s): PROBNP in the last 8760 hours. HbA1C: Recent Labs    03/05/19 0413  HGBA1C 5.4   CBG: No results for input(s): GLUCAP in the last 168 hours. Lipid Profile: Recent Labs    03/05/19 0413  CHOL 111  HDL 27*  LDLCALC 73  TRIG 53  CHOLHDL 4.1   Thyroid Function Tests: No results for input(s): TSH, T4TOTAL, FREET4, T3FREE, THYROIDAB in the last 72 hours. Anemia Panel: No results for input(s): VITAMINB12, FOLATE, FERRITIN, TIBC, IRON, RETICCTPCT in the last 72 hours. Sepsis Labs: No  results for input(s): PROCALCITON, LATICACIDVEN in the last 168 hours.  Recent Results (from the past 240 hour(s))  Blood culture (routine x 2)     Status: None (Preliminary result)   Collection Time: 03/03/19 10:20 PM   Specimen: BLOOD LEFT FOREARM  Result Value Ref Range Status   Specimen Description   Final    BLOOD LEFT FOREARM Performed at [facility], [address] [place], [place] [address]    Special Requests   Final    BOTTLES DRAWN AEROBIC AND ANAEROBIC Blood Culture adequate volume Performed at [facility], [address], [place], [place] [address]    Culture  Final    NO GROWTH 3 DAYS Performed at [facility], [place] [address], [place], [place] [address]    Report Status PENDING  Incomplete  Blood culture (routine x 2)     Status: None (Preliminary result)   Collection Time: 03/03/19 10:20 PM   Specimen: BLOOD RIGHT ARM  Result Value Ref Range Status   Specimen Description   Final    BLOOD RIGHT ARM Performed at [facility], [address] [place], [place] [address]    Special Requests   Final    BOTTLES DRAWN AEROBIC AND ANAEROBIC Blood Culture adequate volume Performed at [facility], [address] [place], [place] [address]    Culture   Final    NO GROWTH 3 DAYS Performed at [facility], [place] [address], [place], [place] [address]    Report Status PENDING  Incomplete  SARS Coronavirus 2 (Hosp order,Performed in [facility] lab via Abbott ID)     Status: None   Collection Time: 03/03/19 10:24 PM   Specimen: Dry Nasal Swab (Abbott ID Now)  Result Value Ref Range Status   SARS Coronavirus 2 (Abbott ID Now) NEGATIVE NEGATIVE Final    Comment: (NOTE) Interpretive Result Comment(s): COVID 19 Positive SARS CoV 2 target nucleic acids are DETECTED. The SARS CoV 2 RNA is generally detectable in upper and lower respiratory specimens during the acute phase of infection.  Positive results are  indicative of active infection with SARS CoV 2.  Clinical correlation with patient history and other diagnostic information is necessary to determine patient infection status.  Positive results do not rule out bacterial infection or coinfection with other viruses. The expected result is Negative. COVID 19 Negative SARS CoV 2 target nucleic acids are NOT DETECTED. The SARS CoV 2 RNA is generally detectable in upper and lower respiratory specimens during the acute phase of infection.  Negative results do not preclude SARS CoV 2 infection, do not rule out coinfections with other pathogens, and should not be used as the sole basis for treatment or other patient management decisions.  Negative results must be combined with clinical  observations, patient history, and epidemiological information. The expected result is Negative. Invalid Presence or absence of SARS CoV 2 nucleic acids cannot be determined. Repeat testing was performed on the submitted specimen and repeated Invalid results were obtained.  If clinically indicated, additional testing on a new specimen with an alternate test methodology [id number]) is advised.  The SARS CoV 2 RNA is generally detectable in upper and lower respiratory specimens during the acute phase of infection. The expected result is Negative. Fact Sheet for Patients:  GolfingFamily.no Fact Sheet for Healthcare Providers: https://www.hernandez-brewer.com/ This test is not yet approved or cleared by the [place] FDA and has been authorized for detection and/or diagnosis of SARS CoV 2 by FDA under an Emergency Use Authorization (EUA).  This EUA will remain in effect (meaning this test can be used) for the duration of the COVID19 d eclaration under Section 564(b)(1) of the Act, 21 U.S.C. section [phone] 3(b)(1), unless the authorization is terminated or revoked sooner. Performed at [facility], [address] [place], [place] [address]   Urine Culture     Status: None   Collection Time: 03/03/19 11:40 PM   Specimen: Urine, Random  Result Value Ref Range Status   Specimen Description   Final    URINE, RANDOM Performed at [facility]  [address] [place], [place] [address]    Special Requests   Final    NONE Performed at [facility], [address], [place], [place] [address]    Culture   Final    NO GROWTH Performed at [facility], [place] [address], [place], [place] [address]    Report Status 03/05/2019 FINAL  Final         Radiology Studies: No results found.      Scheduled Meds: . aspirin  81 mg Oral Daily  . clopidogrel  75 mg Oral Daily  . enoxaparin (LOVENOX) injection  40 mg Subcutaneous Q24H  . memantine  7 mg Oral Daily  . pantoprazole  40 mg Oral Daily   Continuous Infusions:   LOS: 3 days     Georgette Shell, MD [facility]  If 7PM-7AM, please contact night-coverage [email or web address] Password [facility] 03/07/2019, 2:58 PM

## 2019-03-08 MED ORDER — SENNOSIDES-DOCUSATE SODIUM 8.6-50 MG PO TABS: 1.0000 | ORAL_TABLET | Freq: Two times a day (BID) | ORAL | Status: DC | PRN

## 2019-03-08 MED ORDER — AMLODIPINE BESYLATE 5 MG PO TABS
5.0000 mg | ORAL_TABLET | Freq: Every day | ORAL | Status: DC
  Administered 2019-03-08 – 2019-03-21 (×14): 5 mg via ORAL
  Filled 2019-03-08 (×14): qty 1

## 2019-03-08 NOTE — Progress Notes (Signed)
PROGRESS NOTE    John Blackburn  LKG:[id number] DOB: Feb 24, 1946 DOA: 03/03/2019 PCP: Hoyt Koch, MD  Brief Narrative: 73 y.o.malewith medical history significant ofhypertension, hyperlipidemia, GERD, depression, CAD, stent placement, PE 2014 (not onanticoagulants currently),OSA on CPAP,dCHF, who presents with generalized weakness.  Per report, pt's son reported to EDP thatpatient has been declining recently. Patient has generalized weakness, decreased mobility, decreased appetite.Last week,patient did have a fall and hit his head and was evaluated at [facility] with a negative head CT.Pt is so week thathe has difficulty moving from chair to bed without help. Patient denies any chest pain, shortness breath.He has mild dry cough. No fever or chills. Has nausea, butno vomiting, diarrhea or abdominal pain. Patient has increased urinary frequency per his son's report.Patient denies unilateral numbness or tingling inhis extremities. No facial droop or slurred speech.Family is currently working to get the patient into a memory care unit but is still filling out paperwork.  ED Course:pt was found to have negative troponin x2, negative urinalysis, negative COVID-19 test, potassium 2.5, renal function normal, temperature 99.2, blood pressure 156/101,heart rate 73, oxygen saturation 93 to 97% on room air. 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 Pt isadmitted to telemetry bed as inpatient. ED physician discussed with neurologist, Dr. Rory Percy, who recommended MRI for brain.  Assessment & Plan:   Principal Problem:   Generalized weakness Active Problems:   Hypertension   Hyperlipidemia   Obstructive sleep apnea   Chronic diastolic (congestive) heart failure (HCC)   GERD (gastroesophageal reflux disease)   Memory loss   Hypokalemia   Weakness generalized   CAD  (coronary artery disease)  Generalized weakness: Secondary to severe hypokalemia with a K of 2.5 at the time of admission.  No evidence of acute stroke.  MRI of the brain shows-acute finding advanced chronic small vessel ischemia.  B12 286 LDL 73 hemoglobin A1c 5.4 TSH 1.17 sed rate 31 CPK normal. -Patient seen by PT recommends SNF. Family appealing snf denied  Patient lives alone and is an unsafe dc to home.he is not able to do ADLS without significant assistance  HTN:pt is not taking meds at home  increase Norvasc 5 mg daily. -IV hydralazine prn  Hyperlipidemia: -lipitor  Obstructive sleep apnea: -CPAP  Chronic diastolic (congestive) heart failure (HCC):2D echo on 08/30/2017 showed EF of 55-60% with grade 1 diastolic dysfunction. Patient does not have leg edema or JVD. No shortness of breath. CHF is compensated.   GERD (gastroesophageal reflux disease): -Protonix  Memory loss: -Namenda  Hypokalemia: K=2.5on admission. Mg 1.9, potassium 4.2   Hx ofCAD (coronary artery disease):s/p of stent. No chest pain. Trop negative x 2 -Continue Lipitor, aspirin, Plavix -PRN nitroglycerin  Depression and anxiety: -holdLexapro, Wellbutrindue to QTcprolongation  DVT ppx: SQ Lovenox Code Status: Full code Family Communication: Discussed with [name] patient's son Disposition Plan: Patient is a very unsafe discharge to home as he lives alone and is very unstable on his feet has had a recent fall family appealing the denial Consults called:  EDP discussed with neurologist, Dr. Rory Percy  Estimated body mass index is 30.59 kg/m as calculated from the following:   Height as of this encounter: 6' (1.829 m).   Weight as of this encounter: 102.3 kg.   Subjective:  Resting in bed awake alert no specific complaints have no complaints of nausea vomiting Objective: Vitals:   03/08/19 0015 03/08/19 0359 03/08/19 0806 03/08/19  1232  BP: (!) 151/87 (!) 145/87 (!) 164/106  (!) 149/108  Pulse: 77 86 86 80  Resp: 17 17 16 18   Temp: 98.9 F (37.2 C) 97.9 F (36.6 C) 99 F (37.2 C) 98.4 F (36.9 C)  TempSrc: Oral Oral Oral Oral  SpO2: 93% 98% 95% 97%  Weight:      Height:        Intake/Output Summary (Last 24 hours) at 03/08/2019 1256 Last data filed at 03/08/2019 0435 Gross per 24 hour  Intake --  Output 800 ml  Net -800 ml   Filed Weights   03/03/19 2154 03/04/19 0657  Weight: 100.7 kg 102.3 kg    Examination:  General exam: Appears calm and comfortable  Respiratory system: Clear to auscultation. Respiratory effort normal. Cardiovascular system: S1 & S2 heard, RRR. No JVD, murmurs, rubs, gallops or clicks. No pedal edema. Gastrointestinal system: Abdomen is nondistended, soft and nontender. No organomegaly or masses felt. Normal bowel sounds heard. Central nervous system: Alert and oriented. No focal neurological deficits. Extremities: Symmetric 5 x 5 power. Skin: No rashes, lesions or ulcers Psychiatry: Judgement and insight appear normal. Mood & affect appropriate.     Data Reviewed: I have personally reviewed following labs and imaging studies  CBC: Recent Labs  Lab 03/03/19 2224 03/04/19 0455 03/04/19 1116  WBC 10.0 9.7  --   NEUTROABS 7.7  --   --   HGB 15.0 14.3  --   HCT 44.9 42.9 41.1  MCV 99.1 97.9  --   PLT 248 239  --    Basic Metabolic Panel: Recent Labs  Lab 03/03/19 2307 03/04/19 0455 03/04/19 1357 03/05/19 0413 03/06/19 0518  NA 138 138 140 140 137  K 2.5* 3.3* 3.8 4.2 3.9  CL 105 101 103 106 101  CO2 25 27 28 25 26   GLUCOSE 161* 104* 150* 103* 110*  BUN 21 18 16 13 16   CREATININE 0.82 0.84 0.89 0.83 0.82  CALCIUM 7.4* 8.8* 8.9 8.9 9.4  MG 1.9  --   --  2.1  --    GFR: Estimated Creatinine Clearance: 100.8 mL/min (by C-G formula based on SCr of 0.82 mg/dL). Liver Function Tests: Recent Labs  Lab 03/03/19 2224 03/03/19 2307  AST 28 20  ALT 20 15  ALKPHOS 70 61  BILITOT 1.3* 0.8  PROT 7.1 5.7*   ALBUMIN 3.3* 2.8*   No results for input(s): LIPASE, AMYLASE in the last 168 hours. No results for input(s): AMMONIA in the last 168 hours. Coagulation Profile: Recent Labs  Lab 03/04/19 0455  INR 1.1   Cardiac Enzymes: Recent Labs  Lab 03/04/19 1116  CKTOTAL 53   BNP (last 3 results) No results for input(s): PROBNP in the last 8760 hours. HbA1C: No results for input(s): HGBA1C in the last 72 hours. CBG: No results for input(s): GLUCAP in the last 168 hours. Lipid Profile: No results for input(s): CHOL, HDL, LDLCALC, TRIG, CHOLHDL, LDLDIRECT in the last 72 hours. Thyroid Function Tests: No results for input(s): TSH, T4TOTAL, FREET4, T3FREE, THYROIDAB in the last 72 hours. Anemia Panel: No results for input(s): VITAMINB12, FOLATE, FERRITIN, TIBC, IRON, RETICCTPCT in the last 72 hours. Sepsis Labs: No results for input(s): PROCALCITON, LATICACIDVEN in the last 168 hours.  Recent Results (from the past 240 hour(s))  Blood culture (routine x 2)     Status: None (Preliminary result)   Collection Time: 03/03/19 10:20 PM   Specimen: BLOOD LEFT FOREARM  Result Value Ref Range Status  Specimen Description   Final    BLOOD LEFT FOREARM Performed at [facility], [address] [place], [place] [address]    Special Requests   Final    BOTTLES DRAWN AEROBIC AND ANAEROBIC Blood Culture adequate volume Performed at [facility], [address] [place], [place] [address]    Culture   Final    NO GROWTH 4 DAYS Performed at [facility], [place] [address], [place], [place] [address]    Report Status PENDING  Incomplete  Blood culture (routine x 2)     Status: None (Preliminary result)   Collection Time: 03/03/19 10:20 PM   Specimen: BLOOD RIGHT ARM  Result Value Ref Range Status   Specimen Description   Final    BLOOD RIGHT ARM Performed at [facility], [address] [place], [place] [address]    Special Requests   Final    BOTTLES  DRAWN AEROBIC AND ANAEROBIC Blood Culture adequate volume Performed at [facility], [address] [place], [place] [address]    Culture   Final    NO GROWTH 4 DAYS Performed at [facility], [place] [address] [place], [place] [address]    Report Status PENDING  Incomplete  SARS Coronavirus 2 (Hosp order,Performed in [facility] lab via Abbott ID)     Status: None   Collection Time: 03/03/19 10:24 PM   Specimen: Dry Nasal Swab (Abbott ID Now)  Result Value Ref Range Status   SARS Coronavirus 2 (Abbott ID Now) NEGATIVE NEGATIVE Final    Comment: (NOTE) Interpretive Result Comment(s): COVID 19 Positive SARS CoV 2 target nucleic acids are DETECTED. The SARS CoV 2 RNA is generally detectable in upper and lower respiratory specimens during the acute phase of infection.  Positive results are indicative of active infection with SARS CoV 2.  Clinical correlation with patient history and other diagnostic information is necessary to determine patient infection status.  Positive results do not rule out bacterial infection or coinfection with other viruses. The expected result is Negative. COVID 19 Negative SARS CoV 2 target nucleic acids are NOT DETECTED. The SARS CoV 2 RNA is generally detectable in upper and lower respiratory specimens during the acute phase of infection.  Negative results do not preclude SARS CoV 2 infection, do not rule out coinfections with other pathogens, and should not be used as the sole basis for treatment or other patient management decisions.  Negative results must be combined with clinical  observations, patient history, and epidemiological information. The expected result is Negative. Invalid Presence or absence of SARS CoV 2 nucleic acids cannot be determined. Repeat testing was performed on the submitted specimen and repeated Invalid results were obtained.  If clinically indicated, additional testing on a new specimen with an alternate  test methodology [id number]) is advised.  The SARS CoV 2 RNA is generally detectable in upper and lower respiratory specimens during the acute phase of infection. The expected result is Negative. Fact Sheet for Patients:  GolfingFamily.no Fact Sheet for Healthcare Providers: https://www.hernandez-brewer.com/ This test is not yet approved or cleared by the [place] FDA and has been authorized for detection and/or diagnosis of SARS CoV 2 by FDA under an Emergency Use Authorization (EUA).  This EUA will remain in effect (meaning this test can be used) for the duration of the COVID19 d eclaration under Section 564(b)(1) of the Act, 21 U.S.C. section [phone] 3(b)(1), unless the  authorization is terminated or revoked sooner. Performed at [facility], [address] [place], [place] [address]   Urine Culture     Status: None   Collection Time: 03/03/19 11:40 PM   Specimen: Urine, Random  Result Value Ref Range Status   Specimen Description   Final    URINE, RANDOM Performed at [facility], [address] [place], [place] [address]    Special Requests   Final    NONE Performed at [facility], [address] [place], [place] [address]    Culture   Final    NO GROWTH Performed at [facility], [place] [address], [place], [place] [address]    Report Status 03/05/2019 FINAL  Final         Radiology Studies: No results found.      Scheduled Meds:  aspirin  81 mg Oral Daily   clopidogrel  75 mg Oral Daily   enoxaparin (LOVENOX) injection  40 mg Subcutaneous Q24H   memantine  7 mg Oral Daily   pantoprazole  40 mg Oral Daily   Continuous Infusions:   LOS: 4 days       Georgette Shell, MD [facility]  If 7PM-7AM, please contact night-coverage [email or web address] Password [email or web address] 03/08/2019, 12:56 PM

## 2019-03-08 NOTE — Progress Notes (Signed)
Physical Therapy Treatment Patient Details Name: John Blackburn MRN: [id number] DOB: Jun 01, 1946 Today's Date: 03/08/2019    History of Present Illness Patient is a 73 year old male. Per notes sons report that over the past 2 weeks the patient has had increasing weakness. He is confused today. 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 PMH: PE, sleep apnea, skin cancer, MI, depression, GERD, HTN, allergies, OA, bilateral knee replacements     PT Comments    Patient seen for mobility progression. Pt continues to present with slow processing and impaired mobility and is oriented to self only. Pt requires +2 assist for functional transfer training. Given pt's current mobility level continue to recommend SNF for further skilled PT services to maximize independence and safety with mobility.     Follow Up Recommendations  SNF     Equipment Recommendations  (TBD next venue)    Recommendations for Other Services       Precautions / Restrictions Precautions Precautions: Fall Restrictions Weight Bearing Restrictions: No    Mobility  Bed Mobility Overal bed mobility: Needs Assistance Bed Mobility: Supine to Sit;Sit to Supine;Rolling Rolling: Max assist   Supine to sit: Max assist Sit to supine: Max assist;+2 for physical assistance   General bed mobility comments: multimodal cues for sequencing and assistance to bring bilat LE/hips to EOB and to elevate trunk into sitting; use of rail  Transfers Overall transfer level: Needs assistance Equipment used: 2 person hand held assist Transfers: Sit to/from Stand Sit to Stand: Max assist;+2 physical assistance         General transfer comment: pt stood X 2 trials with assistance to power up into standing and bed pad for hip extension; pt unable to achieve upright posture   Ambulation/Gait             General Gait Details: unable    Stairs             Wheelchair Mobility    Modified Rankin (Stroke Patients  Only) Modified Rankin (Stroke Patients Only) Pre-Morbid Rankin Score: Slight disability Modified Rankin: Severe disability     Balance Overall balance assessment: Needs assistance Sitting-balance support: Bilateral upper extremity supported;Feet supported Sitting balance-Leahy Scale: Poor Sitting balance - Comments: verying levels of assist needed; at times pt is able to maintain sitting balance EOB with min guard for safety but just briefly  Postural control: Posterior lean;Left lateral lean Standing balance support: Bilateral upper extremity supported Standing balance-Leahy Scale: Zero                              Cognition Arousal/Alertness: Awake/alert Behavior During Therapy: Flat affect Overall Cognitive Status: Impaired/Different from baseline Area of Impairment: Orientation;Attention;Memory;Following commands;Safety/judgement;Problem solving                 Orientation Level: Disoriented to;Place;Time;Situation Current Attention Level: Sustained Memory: Decreased short-term memory Following Commands: Follows one step commands inconsistently;Follows one step commands with increased time Safety/Judgement: Decreased awareness of safety;Decreased awareness of deficits   Problem Solving: Slow processing;Decreased initiation;Difficulty sequencing;Requires verbal cues;Requires tactile cues General Comments: pt able to state month and day of birth but not the year; reports he's in [facility]; pt soiled upon arrival and pt unaware       Exercises      General Comments        Pertinent Vitals/Pain Pain Assessment: No/denies pain    Home Living  Prior Function            PT Goals (current goals can now be found in the care plan section) Progress towards PT goals: Progressing toward goals    Frequency    Min 3X/week      PT Plan Current plan remains appropriate    Co-evaluation              AM-PAC PT "6  Clicks" Mobility   Outcome Measure  Help needed turning from your back to your side while in a flat bed without using bedrails?: A Lot Help needed moving from lying on your back to sitting on the side of a flat bed without using bedrails?: A Lot Help needed moving to and from a bed to a chair (including a wheelchair)?: A Lot Help needed standing up from a chair using your arms (e.g., wheelchair or bedside chair)?: A Lot Help needed to walk in hospital room?: Total Help needed climbing 3-5 steps with a railing? : Total 6 Click Score: 10    End of Session Equipment Utilized During Treatment: Gait belt Activity Tolerance: Patient tolerated treatment well Patient left: in bed;with call bell/phone within reach;with bed alarm set Nurse Communication: Mobility status PT Visit Diagnosis: Other abnormalities of gait and mobility (R26.89);Muscle weakness (generalized) (M62.81);Difficulty in walking, not elsewhere classified (R26.2)     Time: 0051-1021 PT Time Calculation (min) (ACUTE ONLY): 34 min  Charges:  $Gait Training: 8-22 mins $Therapeutic Activity: 8-22 mins                     Earney Navy, PTA Acute Rehabilitation Services Pager: [phone] Office: [phone]     Darliss Cheney 03/08/2019, 11:32 AM

## 2019-03-08 NOTE — TOC Progression Note (Signed)
Transition of Care [facility]) - Progression Note    Patient Details  Name: John Blackburn MRN: [id number] Date of Birth: 1945/12/09  Transition of Care [facility]) CM/SW [place], [place] Phone Number: 03/08/2019, 12:27 PM  Clinical Narrative:     CSW called and spoke with the patient's son about the insurance appeal. He stated that he hasn't appealed the decision yet, he stated that it was late in the day yesterday when he found out about the denial. He stated that he had spoken with Di Kindle at [facility] and they recommended an LOG. CSW explained that the LOG would be the last option and would have to be discussed with the [place] Director.   CSW explained that they would need a final denial before an LOG could get approve.   CSW called and spoke with [facility] Surveyor, quantity, Nathaniel Man. Zack stated that the CSW would need to call and speak with [facility] Olga Coaster, she is now screening all LOG.   CSW called and spoke with Hassan Rowan about the patient. She stated that she would run it by [employer].   CSW will continue to follow.   Expected Discharge Plan: [place] Barriers to Discharge: Continued Medical Work up  Expected Discharge Plan and Services Expected Discharge Plan: [place] arrangements for the past 2 months: Apartment(ground level) Expected Discharge Date: 03/06/19                                     Social Determinants of Health (SDOH) Interventions    Readmission Risk Interventions No flowsheet data found.

## 2019-03-09 LAB — CULTURE, BLOOD (ROUTINE X 2)
Culture: NO GROWTH
Culture: NO GROWTH
Special Requests: ADEQUATE
Special Requests: ADEQUATE

## 2019-03-09 NOTE — Progress Notes (Signed)
RT placed pt on CPAP dream station for the night. Pt on home setting of 10 cmH2O with no O2 bled into the system. Pt respiratory status stable at this time and tolerating CPAP well. RT will continue to monitor.

## 2019-03-09 NOTE — Progress Notes (Signed)
...  PROGRESS NOTE    John Blackburn  EPP:[id number] DOB: [birth year] DOA: 03/03/2019 PCP: Hoyt Koch, MD   Brief Narrative:73 y.o.malewith medical history significant ofhypertension, hyperlipidemia, GERD, depression, CAD, stent placement, PE 2014 (not onanticoagulants currently),OSA on CPAP,dCHF, who presents with generalized weakness.  Per report, pt's son reported to EDP thatpatient has been declining recently. Patient has generalized weakness, decreased mobility, decreased appetite.Last week,patient did have a fall and hit his head and was evaluated at [facility] with a negative head CT.Pt is so week thathe has difficulty moving from chair to bed without help. Patient denies any chest pain, shortness breath.He has mild dry cough. No fever or chills. Has nausea, butno vomiting, diarrhea or abdominal pain. Patient has increased urinary frequency per his son's report.Patient denies unilateral numbness or tingling inhis extremities. No facial droop or slurred speech.Family is currently working to get the patient into a memory care unit but is still filling out paperwork.  ED Course:pt was found to have negative troponin x2, negative urinalysis, negative COVID-19 test, potassium 2.5, renal function normal, temperature 99.2, blood pressure 156/101,heart rate 73, oxygen saturation 93 to 97% on room air. 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 Pt isadmitted to telemetry bed as inpatient. ED physician discussed with neurologist, Dr. Rory Percy, who recommended MRI for brain.  Assessment & Plan:   Principal Problem:   Generalized weakness Active Problems:   Hypertension   Hyperlipidemia   Obstructive sleep apnea   Chronic diastolic (congestive) heart failure (HCC)   GERD (gastroesophageal reflux disease)   Memory loss   Hypokalemia   Weakness generalized   CAD  (coronary artery disease)   Generalized weakness: Secondary to severe hypokalemia with a K of 2.5 at the time of admission.No evidence of acute stroke. MRI of the brain shows-acute finding advanced chronic small vessel ischemia. B12 286 LDL 73 hemoglobin A1c 5.4 TSH 1.17 sed rate 31 CPK normal. -Patient seen by PT recommends SNF. Family appealing snf denied Patient lives alone and is an unsafe dc to home.he is not able to do ADLS without significant assistance  HTN:pt is not taking meds at home increase Norvasc 5 mg daily.bp better. -IV hydralazine prn  Hyperlipidemia: -lipitor  Obstructive sleep apnea: -CPAP  Chronic diastolic (congestive) heart failure (HCC):2D echo on 08/30/2017 showed EF of 55-60% with grade 1 diastolic dysfunction. Patient does not have leg edema or JVD. No shortness of breath. CHF is compensated.   GERD (gastroesophageal reflux disease): -Protonix  Memory loss: -Namenda  Hypokalemia: K=2.5on admission. Mg 1.9,resolved   Hx ofCAD (coronary artery disease):s/p of stent. No chest pain. Trop negative x 2 -Continue Lipitor, aspirin, Plavix -PRN nitroglycerin  Depression and anxiety:continue lexapro and wellbutrin  DVT ppx: SQ Lovenox  Code Status:Full code  Family Communication: Discussed with [name] patient's son  Disposition Plan: Patient is a very unsafe discharge to home as he lives alone and is very unstable on his feet has had a recent fall family appealing the denial  Consults called:EDPdiscussed with neurologist, Dr. Rory Percy   Estimated body mass index is 30.59 kg/m as calculated from the following:   Height as of this encounter: 6' (1.829 m).   Weight as of this encounter: 102.3 kg.    Subjective:  Resting in bed in nad  Objective: Vitals:   03/08/19 2313 03/08/19 2358 03/09/19 0413 03/09/19 0807  BP: 139/87  (!) 155/86 (!) 143/79  Pulse:  91  88 93  Resp: 16  18 18   Temp: 98.4 F (36.9 C)  98.5  F (36.9 C) 98.2 F (36.8 C)  TempSrc: Oral  Oral Oral  SpO2: 98% 98% 98% 99%  Weight:      Height:        Intake/Output Summary (Last 24 hours) at 03/09/2019 1108 Last data filed at 03/09/2019 0412 Gross per 24 hour  Intake -  Output 500 ml  Net -500 ml   Filed Weights   03/03/19 2154 03/04/19 0657  Weight: 100.7 kg 102.3 kg    Examination:  General exam: Appears calm and comfortable  Respiratory system: Clear to auscultation. Respiratory effort normal. Cardiovascular system: S1 & S2 heard, RRR. No JVD, murmurs, rubs, gallops or clicks. No pedal edema. Gastrointestinal system: Abdomen is nondistended, soft and nontender. No organomegaly or masses felt. Normal bowel sounds heard. Central nervous system: Alert and oriented. No focal neurological deficits. Extremities: Symmetric 5 x 5 power. Skin: No rashes, lesions or ulcers Psychiatry: Judgement and insight appear normal. Mood & affect appropriate.     Data Reviewed: I have personally reviewed following labs and imaging studies  CBC: Recent Labs  Lab 03/03/19 2224 03/04/19 0455 03/04/19 1116  WBC 10.0 9.7  --   NEUTROABS 7.7  --   --   HGB 15.0 14.3  --   HCT 44.9 42.9 41.1  MCV 99.1 97.9  --   PLT 248 239  --    Basic Metabolic Panel: Recent Labs  Lab 03/03/19 2307 03/04/19 0455 03/04/19 1357 03/05/19 0413 03/06/19 0518  NA 138 138 140 140 137  K 2.5* 3.3* 3.8 4.2 3.9  CL 105 101 103 106 101  CO2 25 27 28 25 26   GLUCOSE 161* 104* 150* 103* 110*  BUN 21 18 16 13 16   CREATININE 0.82 0.84 0.89 0.83 0.82  CALCIUM 7.4* 8.8* 8.9 8.9 9.4  MG 1.9  --   --  2.1  --    GFR: Estimated Creatinine Clearance: 100.8 mL/min (by C-G formula based on SCr of 0.82 mg/dL). Liver Function Tests: Recent Labs  Lab 03/03/19 2224 03/03/19 2307  AST 28 20  ALT 20 15  ALKPHOS 70 61  BILITOT 1.3* 0.8  PROT 7.1 5.7*  ALBUMIN 3.3* 2.8*   No results for input(s): LIPASE, AMYLASE in the last 168 hours. No results for  input(s): AMMONIA in the last 168 hours. Coagulation Profile: Recent Labs  Lab 03/04/19 0455  INR 1.1   Cardiac Enzymes: Recent Labs  Lab 03/04/19 1116  CKTOTAL 53   BNP (last 3 results) No results for input(s): PROBNP in the last 8760 hours. HbA1C: No results for input(s): HGBA1C in the last 72 hours. CBG: No results for input(s): GLUCAP in the last 168 hours. Lipid Profile: No results for input(s): CHOL, HDL, LDLCALC, TRIG, CHOLHDL, LDLDIRECT in the last 72 hours. Thyroid Function Tests: No results for input(s): TSH, T4TOTAL, FREET4, T3FREE, THYROIDAB in the last 72 hours. Anemia Panel: No results for input(s): VITAMINB12, FOLATE, FERRITIN, TIBC, IRON, RETICCTPCT in the last 72 hours. Sepsis Labs: No results for input(s): PROCALCITON, LATICACIDVEN in the last 168 hours.  Recent Results (from the past 240 hour(s))  Blood culture (routine x 2)     Status: None (Preliminary result)   Collection Time: 03/03/19 10:20 PM   Specimen: BLOOD LEFT FOREARM  Result Value Ref Range Status   Specimen Description   Final    BLOOD LEFT FOREARM Performed at [facility], [address] [place], [place] [address]    Special Requests   Final    BOTTLES DRAWN AEROBIC AND ANAEROBIC Blood Culture adequate volume Performed at [facility], [address] [place], [place] [address]    Culture   Final    NO GROWTH 4 DAYS Performed at [facility], [place] [address] [place], [place] [address]    Report Status PENDING  Incomplete  Blood culture (routine x 2)     Status: None (Preliminary result)   Collection Time: 03/03/19 10:20 PM   Specimen: BLOOD RIGHT ARM  Result Value Ref Range Status   Specimen Description   Final    BLOOD RIGHT ARM Performed at [facility], [address] [place], [place] [address]    Special Requests   Final    BOTTLES DRAWN AEROBIC AND ANAEROBIC Blood Culture adequate volume Performed at [facility], [address] [place], [place] [address]    Culture   Final    NO GROWTH 4 DAYS Performed at [facility], [place] [address] [place], [place] [address]    Report Status PENDING  Incomplete  SARS Coronavirus 2 (Hosp order,Performed in [facility] lab via [employer] ID)     Status: None   Collection Time: 03/03/19 10:24 PM   Specimen: Dry Nasal Swab ([employer] ID Now)  Result Value Ref Range Status   SARS Coronavirus 2 ([employer] ID Now) NEGATIVE NEGATIVE Final    Comment: (NOTE) Interpretive Result Comment(s): COVID 19 Positive SARS CoV 2 target nucleic acids are DETECTED. The SARS CoV 2 RNA is generally detectable in upper and lower respiratory specimens during the acute phase of infection.  Positive results are indicative of active infection with SARS CoV 2.  Clinical correlation with patient history and other diagnostic information is necessary to determine patient infection status.  Positive results do not rule out bacterial infection or coinfection with other viruses. The expected result is Negative. COVID 19 Negative SARS CoV 2 target nucleic acids are NOT DETECTED. The SARS CoV 2 RNA is generally detectable in upper and lower respiratory specimens during the acute phase of infection.  Negative results do not preclude SARS CoV 2 infection, do not rule out coinfections with other pathogens, and should not be used as the sole basis for treatment or other patient management decisions.  Negative results must be combined with clinical  observations, patient history, and epidemiological information. The expected result is Negative. Invalid Presence or absence of SARS CoV 2 nucleic acids cannot be determined. Repeat testing was performed on the submitted specimen and repeated Invalid results were obtained.  If clinically indicated, additional testing on a new specimen with an alternate test methodology [phone]) is advised.  The SARS CoV 2 RNA is generally detectable in upper and  lower respiratory specimens during the acute phase of infection. The expected result is Negative. Fact Sheet for Patients:  GolfingFamily.no Fact Sheet for Healthcare Providers: https://www.hernandez-brewer.com/ This test is not yet approved or cleared by the [place] FDA and has been authorized for detection and/or diagnosis of SARS CoV 2 by FDA under an Emergency Use Authorization (EUA).  This EUA will remain in effect (meaning this test can be used) for the duration of the COVID19 d eclaration under Section 564(b)(1) of the Act, 21 U.S.C. section [phone] 3(b)(1), unless the authorization is terminated or revoked sooner. Performed at [facility], [place]  [address] [place], [place] [address]   Urine Culture     Status: None   Collection Time: 03/03/19 11:40 PM   Specimen: Urine, Random  Result Value Ref Range Status   Specimen Description   Final    URINE, RANDOM Performed at [facility], [address] [place], [place] [address]    Special Requests   Final    NONE Performed at [facility], [address] [place], [place] [address]    Culture   Final    NO GROWTH Performed at [facility], [place] [address] [place], [place] [address]    Report Status 03/05/2019 FINAL  Final         Radiology Studies: No results found.      Scheduled Meds: . amLODipine  5 mg Oral Daily  . aspirin  81 mg Oral Daily  . clopidogrel  75 mg Oral Daily  . enoxaparin (LOVENOX) injection  40 mg Subcutaneous Q24H  . memantine  7 mg Oral Daily  . pantoprazole  40 mg Oral Daily   Continuous Infusions:   LOS: 5 days     Georgette Shell, MD [facility]  If 7PM-7AM, please contact night-coverage [email or web address] Password [email or web address] 03/09/2019, 11:08 AM ,mpn

## 2019-03-10 NOTE — Progress Notes (Signed)
PROGRESS NOTE    KENT RIENDEAU  BVQ:[id number] DOB: 02-Oct-1945 DOA: 03/03/2019 PCP: Hoyt Koch, MD  Brief Narrative:  73 y.o.malewith medical history significant ofhypertension, hyperlipidemia, GERD, depression, CAD, stent placement, PE 2014 (not onanticoagulants currently),OSA on CPAP,dCHF, who presents with generalized weakness.  Per report, pt's son reported to EDP thatpatient has been declining recently. Patient has generalized weakness, decreased mobility, decreased appetite.Last week,patient did have a fall and hit his head and was evaluated at [facility] with a negative head CT.Pt is so week thathe has difficulty moving from chair to bed without help. Patient denies any chest pain, shortness breath.He has mild dry cough. No fever or chills. Has nausea, butno vomiting, diarrhea or abdominal pain. Patient has increased urinary frequency per his son's report.Patient denies unilateral numbness or tingling inhis extremities. No facial droop or slurred speech.Family is currently working to get the patient into a memory care unit but is still filling out paperwork.  ED Course:pt was found to have negative troponin x2, negative urinalysis, negative COVID-19 test, potassium 2.5, renal function normal, temperature 99.2, blood pressure 156/101,heart rate 73, oxygen saturation 93 to 97% on room air. 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 Pt isadmitted to telemetry bed as inpatient. ED physician discussed with neurologist, Dr. Rory Percy, who recommended MRI for brain. Assessment & Plan:   Principal Problem:   Generalized weakness Active Problems:   Hypertension   Hyperlipidemia   Obstructive sleep apnea   Chronic diastolic (congestive) heart failure (HCC)   GERD (gastroesophageal reflux disease)   Memory loss   Hypokalemia   Weakness generalized   CAD  (coronary artery disease)   Generalized weakness: Secondary to severe hypokalemia with a K of 2.5 at the time of admission.No evidence of acute stroke. MRI of the brain shows-acute finding advanced chronic small vessel ischemia. B12 286 LDL 73 hemoglobin A1c 5.4 TSH 1.17 sed rate 31 CPK normal. -Patient seen by PT recommends SNF. Family appealing snf denied Patient lives alone and is an unsafe dc to home.he is not able to do ADLS without significant assistance  HTN:pt is not taking meds at homeincreaseNorvasc5 mg daily.bp better. -IV hydralazine prn  Hyperlipidemia: -lipitor  Obstructive sleep apnea: -CPAP  Chronic diastolic (congestive) heart failure (HCC):2D echo on 08/30/2017 showed EF of 55-60% with grade 1 diastolic dysfunction. Patient does not have leg edema or JVD. No shortness of breath. CHF is compensated.   GERD (gastroesophageal reflux disease): -Protonix  Memory loss: -Namenda  Hx ofCAD (coronary artery disease):s/p of stent. No chest pain. Trop negative x 2 -Continue Lipitor, aspirin, Plavix -PRN nitroglycerin  Depression and anxiety:continue lexapro and wellbutrin  DVT ppx: SQ Lovenox  Code Status:Full code  Family Communication:Discussed with [name] patient's son  Disposition Plan:Patient is a very unsafe discharge to home as he lives alone and is very unstable on his feet has had a recent fall family appealing the denial  Consults called:EDPdiscussed with neurologist, Dr. Rory Percy  Estimated body mass index is 30.59 kg/m as calculated from the following:   Height as of this encounter: 6' (1.829 m).   Weight as of this encounter: 102.3 kg.    Subjective: Patient resting in bed denies any new complaints overnight staff reports no issues Objective: Vitals:   03/10/19 0029 03/10/19 0438 03/10/19 0905 03/10/19 1058  BP: 135/83 140/79 130/76 118/73  Pulse: 81 85 88 81  Resp: 18 16 16  16  Temp: 97.8 F (36.6 C) 97.9  F (36.6 C) 98.2 F (36.8 C) (!) 97.5 F (36.4 C)  TempSrc: Oral Oral Oral Axillary  SpO2: 100% 98% 97% 96%  Weight:      Height:        Intake/Output Summary (Last 24 hours) at 03/10/2019 1059 Last data filed at 03/10/2019 0518 Gross per 24 hour  Intake -  Output 1100 ml  Net -1100 ml   Filed Weights   03/03/19 2154 03/04/19 0657  Weight: 100.7 kg 102.3 kg    Examination:  General exam: Appears calm and comfortable  Respiratory system: Clear to auscultation. Respiratory effort normal. Cardiovascular system: S1 & S2 heard, RRR. No JVD, murmurs, rubs, gallops or clicks. No pedal edema. Gastrointestinal system: Abdomen is nondistended, soft and nontender. No organomegaly or masses felt. Normal bowel sounds heard. Central nervous system: Alert and oriented. No focal neurological deficits. Extremities: Symmetric 5 x 5 power. Skin: No rashes, lesions or ulcers Psychiatry: Judgement and insight appear normal. Mood & affect appropriate.     Data Reviewed: I have personally reviewed following labs and imaging studies  CBC: Recent Labs  Lab 03/03/19 2224 03/04/19 0455 03/04/19 1116  WBC 10.0 9.7  --   NEUTROABS 7.7  --   --   HGB 15.0 14.3  --   HCT 44.9 42.9 41.1  MCV 99.1 97.9  --   PLT 248 239  --    Basic Metabolic Panel: Recent Labs  Lab 03/03/19 2307 03/04/19 0455 03/04/19 1357 03/05/19 0413 03/06/19 0518  NA 138 138 140 140 137  K 2.5* 3.3* 3.8 4.2 3.9  CL 105 101 103 106 101  CO2 25 27 28 25 26   GLUCOSE 161* 104* 150* 103* 110*  BUN 21 18 16 13 16   CREATININE 0.82 0.84 0.89 0.83 0.82  CALCIUM 7.4* 8.8* 8.9 8.9 9.4  MG 1.9  --   --  2.1  --    GFR: Estimated Creatinine Clearance: 100.8 mL/min (by C-G formula based on SCr of 0.82 mg/dL). Liver Function Tests: Recent Labs  Lab 03/03/19 2224 03/03/19 2307  AST 28 20  ALT 20 15  ALKPHOS 70 61  BILITOT 1.3* 0.8  PROT 7.1 5.7*  ALBUMIN 3.3* 2.8*   No results for input(s): LIPASE, AMYLASE in the  last 168 hours. No results for input(s): AMMONIA in the last 168 hours. Coagulation Profile: Recent Labs  Lab 03/04/19 0455  INR 1.1   Cardiac Enzymes: Recent Labs  Lab 03/04/19 1116  CKTOTAL 53   BNP (last 3 results) No results for input(s): PROBNP in the last 8760 hours. HbA1C: No results for input(s): HGBA1C in the last 72 hours. CBG: No results for input(s): GLUCAP in the last 168 hours. Lipid Profile: No results for input(s): CHOL, HDL, LDLCALC, TRIG, CHOLHDL, LDLDIRECT in the last 72 hours. Thyroid Function Tests: No results for input(s): TSH, T4TOTAL, FREET4, T3FREE, THYROIDAB in the last 72 hours. Anemia Panel: No results for input(s): VITAMINB12, FOLATE, FERRITIN, TIBC, IRON, RETICCTPCT in the last 72 hours. Sepsis Labs: No results for input(s): PROCALCITON, LATICACIDVEN in the last 168 hours.  Recent Results (from the past 240 hour(s))  Blood culture (routine x 2)     Status: None   Collection Time: 03/03/19 10:20 PM   Specimen: BLOOD LEFT FOREARM  Result Value Ref Range Status   Specimen Description   Final    BLOOD LEFT FOREARM Performed at [facility], [address] [place],  [place] [address]    Special Requests   Final    BOTTLES DRAWN AEROBIC AND ANAEROBIC Blood Culture adequate volume Performed at [facility], [address] [place], [place] [address]    Culture   Final    NO GROWTH 5 DAYS Performed at [facility], [place] [address], [place], [place] [address]    Report Status 03/09/2019 FINAL  Final  Blood culture (routine x 2)     Status: None   Collection Time: 03/03/19 10:20 PM   Specimen: BLOOD RIGHT ARM  Result Value Ref Range Status   Specimen Description   Final    BLOOD RIGHT ARM Performed at [facility], [address] [place], [place] [address]    Special Requests   Final    BOTTLES DRAWN AEROBIC AND ANAEROBIC Blood Culture adequate volume Performed at [facility], [address] [place], [place] [address]    Culture   Final    NO GROWTH 5 DAYS Performed at [facility], [place] [address] [place], [place] [address]    Report Status 03/09/2019 FINAL  Final  SARS Coronavirus 2 (Hosp order,Performed in [facility] lab via Abbott ID)     Status: None   Collection Time: 03/03/19 10:24 PM   Specimen: Dry Nasal Swab (Abbott ID Now)  Result Value Ref Range Status   SARS Coronavirus 2 (Abbott ID Now) NEGATIVE NEGATIVE Final    Comment: (NOTE) Interpretive Result Comment(s): COVID 19 Positive SARS CoV 2 target nucleic acids are DETECTED. The SARS CoV 2 RNA is generally detectable in upper and lower respiratory specimens during the acute phase of infection.  Positive results are indicative of active infection with SARS CoV 2.  Clinical correlation with patient history and other diagnostic information is necessary to determine patient infection status.  Positive results do not rule out bacterial infection or coinfection with other viruses. The expected result is Negative. COVID 19 Negative SARS CoV 2 target nucleic acids are NOT DETECTED. The SARS CoV 2 RNA is generally detectable in upper and lower respiratory specimens during the acute phase of infection.  Negative results do not preclude SARS CoV 2 infection, do not rule out coinfections with other pathogens, and should not be used as the sole basis for treatment or other patient management decisions.  Negative results must be combined with clinical  observations, patient history, and epidemiological information. The expected result is Negative. Invalid Presence or absence of SARS CoV 2 nucleic acids cannot be determined. Repeat testing was performed on the submitted specimen and repeated Invalid results were obtained.  If clinically indicated, additional testing on a new specimen with an alternate test methodology [id number]) is advised.  The SARS CoV 2 RNA is generally detectable in upper and lower  respiratory specimens during the acute phase of infection. The expected result is Negative. Fact Sheet for Patients:  GolfingFamily.no Fact Sheet for Healthcare Providers: https://www.hernandez-brewer.com/ This test is not yet approved or cleared by the [place] FDA and has been authorized for detection and/or diagnosis of SARS CoV 2 by FDA under an Emergency Use Authorization (EUA).  This EUA will remain in effect (meaning this test can be used) for the duration of the COVID19 d eclaration under Section 564(b)(1) of the Act, 21 U.S.C. section [phone] 3(b)(1), unless the authorization is terminated or revoked sooner. Performed at [facility], [address], [place], [place] [address]   Urine  Culture     Status: None   Collection Time: 03/03/19 11:40 PM   Specimen: Urine, Random  Result Value Ref Range Status   Specimen Description   Final    URINE, RANDOM Performed at [facility], [address] [place], [place] [address]    Special Requests   Final    NONE Performed at [facility], [address] [place], [place] [address]    Culture   Final    NO GROWTH Performed at [facility], [place] [address], [place], [place] [address]    Report Status 03/05/2019 FINAL  Final         Radiology Studies: No results found.      Scheduled Meds: . amLODipine  5 mg Oral Daily  . aspirin  81 mg Oral Daily  . clopidogrel  75 mg Oral Daily  . enoxaparin (LOVENOX) injection  40 mg Subcutaneous Q24H  . memantine  7 mg Oral Daily  . pantoprazole  40 mg Oral Daily   Continuous Infusions:   LOS: 6 days      Georgette Shell, MD [facility]  If 7PM-7AM, please contact night-coverage [email or web address] Password [email or web address] 03/10/2019, 10:59 AM

## 2019-03-11 NOTE — Progress Notes (Signed)
TOC spoke to patients son: [name]. He called and filled out the paperwork for the appeal this am and is sending it back to [employer]. He is going to inform TOC once he gets a decision. CM also encouraged him to be looking at [place] facilities for his dad.

## 2019-03-11 NOTE — Progress Notes (Signed)
PROGRESS NOTE    John Blackburn  VZD:[id number] DOB: Apr 27, 1946 DOA: 03/03/2019 PCP: Hoyt Koch, MD  Brief Narrative:  73 y.o.malewith medical history significant ofhypertension, hyperlipidemia, GERD, depression, CAD, stent placement, PE 2014 (not onanticoagulants currently),OSA on CPAP,dCHF, who presents with generalized weakness.  Per report, pt's son reported to EDP thatpatient has been declining recently. Patient has generalized weakness, decreased mobility, decreased appetite.Last week,patient did have a fall and hit his head and was evaluated at [facility] with a negative head CT.Pt is so week thathe has difficulty moving from chair to bed without help. Patient denies any chest pain, shortness breath.He has mild dry cough. No fever or chills. Has nausea, butno vomiting, diarrhea or abdominal pain. Patient has increased urinary frequency per his son's report.Patient denies unilateral numbness or tingling inhis extremities. No facial droop or slurred speech.Family is currently working to get the patient into a memory care unit but is still filling out paperwork.  ED Course:pt was found to have negative troponin x2, negative urinalysis, negative COVID-19 test, potassium 2.5, renal function normal, temperature 99.2, blood pressure 156/101,heart rate 73, oxygen saturation 93 to 97% on room air. 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 Pt isadmitted to telemetry bed as inpatient. ED physician discussed with neurologist, Dr. Rory Percy, who recommended MRI for brain. Assessment & Plan:   Principal Problem:   Generalized weakness Active Problems:   Hypertension   Hyperlipidemia   Obstructive sleep apnea   Chronic diastolic (congestive) heart failure (HCC)   GERD (gastroesophageal reflux disease)   Memory loss   Hypokalemia   Weakness generalized   CAD  (coronary artery disease)   Generalized weakness: Secondary to severe hypokalemia with a K of 2.5 at the time of admission.No evidence of acute stroke. MRI of the brain shows-acute finding advanced chronic small vessel ischemia. B12 286 LDL 73 hemoglobin A1c 5.4 TSH 1.17 sed rate 31 CPK normal. -Patient seen by PT recommends SNF. Family appealing snf denied Patient lives alone and is an unsafe dc to home.he is not able to do ADLS without significant assistance  HTN:pt is not taking meds at homeincreaseNorvasc5 mg daily.bp better. -IV hydralazine prn  Hyperlipidemia: -lipitor  Obstructive sleep apnea: -CPAP  Chronic diastolic (congestive) heart failure (HCC):2D echo on 08/30/2017 showed EF of 55-60% with grade 1 diastolic dysfunction. Patient does not have leg edema or JVD. No shortness of breath. CHF is compensated.   GERD (gastroesophageal reflux disease): -Protonix  Memory loss: -Namenda  Hx ofCAD (coronary artery disease):s/p of stent. No chest pain. Trop negative x 2 -Continue Lipitor, aspirin, Plavix -PRN nitroglycerin  Depression and anxiety:continue lexapro and wellbutrin  DVT ppx: SQ Lovenox  Code Status:Full code  Family Communication:Discussed with [name] patient's son  Disposition Plan:Patient is a very unsafe discharge to home as he lives alone and is very unstable on his feet has had a recent fall family appealing the denial  Consults called:EDPdiscussed with neurologist, Dr. Rory Percy  Estimated body mass index is 30.59 kg/m as calculated from the following:   Height as of this encounter: 6' (1.829 m).   Weight as of this encounter: 102.3 kg.    Subjective: Patient resting in bed denies any new complaints overnight staff reports no issues Objective: Vitals:   03/10/19 2347 03/11/19 0402 03/11/19 0731 03/11/19 1121  BP: 134/70 (!) 141/88 (!) 147/91 122/81  Pulse: 82 87 84 89  Resp: 18 18 18  18  Temp: 97.7 F (36.5  C) (!) 97.5 F (36.4 C) 97.8 F (36.6 C) 97.6 F (36.4 C)  TempSrc: Axillary Axillary Oral Oral  SpO2: 97% 97% 97% 98%  Weight:      Height:        Intake/Output Summary (Last 24 hours) at 03/11/2019 1601 Last data filed at 03/11/2019 4627 Gross per 24 hour  Intake -  Output 800 ml  Net -800 ml   Filed Weights   03/03/19 2154 03/04/19 0657  Weight: 100.7 kg 102.3 kg    Examination:  General exam: Appears calm and comfortable  Respiratory system: Clear to auscultation. Respiratory effort normal. Cardiovascular system: S1 & S2 heard, RRR. No JVD, murmurs, rubs, gallops or clicks. No pedal edema. Gastrointestinal system: Abdomen is nondistended, soft and nontender. No organomegaly or masses felt. Normal bowel sounds heard. Central nervous system: Alert and oriented. No focal neurological deficits. Extremities: Symmetric 5 x 5 power. Skin: No rashes, lesions or ulcers Psychiatry: Judgement and insight appear normal. Mood & affect appropriate.     Data Reviewed: I have personally reviewed following labs and imaging studies  CBC: No results for input(s): WBC, NEUTROABS, HGB, HCT, MCV, PLT in the last 168 hours. Basic Metabolic Panel: Recent Labs  Lab 03/05/19 0413 03/06/19 0518  NA 140 137  K 4.2 3.9  CL 106 101  CO2 25 26  GLUCOSE 103* 110*  BUN 13 16  CREATININE 0.83 0.82  CALCIUM 8.9 9.4  MG 2.1  --    GFR: Estimated Creatinine Clearance: 100.8 mL/min (by C-G formula based on SCr of 0.82 mg/dL). Liver Function Tests: No results for input(s): AST, ALT, ALKPHOS, BILITOT, PROT, ALBUMIN in the last 168 hours. No results for input(s): LIPASE, AMYLASE in the last 168 hours. No results for input(s): AMMONIA in the last 168 hours. Coagulation Profile: No results for input(s): INR, PROTIME in the last 168 hours. Cardiac Enzymes: No results for input(s): CKTOTAL, CKMB, CKMBINDEX, TROPONINI in the last 168 hours. BNP (last 3 results) No results for input(s): PROBNP in  the last 8760 hours. HbA1C: No results for input(s): HGBA1C in the last 72 hours. CBG: No results for input(s): GLUCAP in the last 168 hours. Lipid Profile: No results for input(s): CHOL, HDL, LDLCALC, TRIG, CHOLHDL, LDLDIRECT in the last 72 hours. Thyroid Function Tests: No results for input(s): TSH, T4TOTAL, FREET4, T3FREE, THYROIDAB in the last 72 hours. Anemia Panel: No results for input(s): VITAMINB12, FOLATE, FERRITIN, TIBC, IRON, RETICCTPCT in the last 72 hours. Sepsis Labs: No results for input(s): PROCALCITON, LATICACIDVEN in the last 168 hours.  Recent Results (from the past 240 hour(s))  Blood culture (routine x 2)     Status: None   Collection Time: 03/03/19 10:20 PM   Specimen: BLOOD LEFT FOREARM  Result Value Ref Range Status   Specimen Description   Final    BLOOD LEFT FOREARM Performed at [facility], [address] [place], [place] [address]    Special Requests   Final    BOTTLES DRAWN AEROBIC AND ANAEROBIC Blood Culture adequate volume Performed at [facility], [address] [place], [place] [address]    Culture   Final    NO GROWTH 5 DAYS Performed at [facility], [place] [address], [place], [place] [address]    Report Status 03/09/2019 FINAL  Final  Blood culture (routine x 2)     Status: None   Collection Time: 03/03/19 10:20 PM  Specimen: BLOOD RIGHT ARM  Result Value Ref Range Status   Specimen Description   Final    BLOOD RIGHT ARM Performed at [facility], [address] [place], [place] [address]    Special Requests   Final    BOTTLES DRAWN AEROBIC AND ANAEROBIC Blood Culture adequate volume Performed at [facility], [address] [place], [place] [address]    Culture   Final    NO GROWTH 5 DAYS Performed at [facility], [place] [address] [place], [place] [address]    Report Status 03/09/2019 FINAL  Final  SARS Coronavirus 2 (Hosp order,Performed in [facility] lab via Abbott ID)      Status: None   Collection Time: 03/03/19 10:24 PM   Specimen: Dry Nasal Swab (Abbott ID Now)  Result Value Ref Range Status   SARS Coronavirus 2 (Abbott ID Now) NEGATIVE NEGATIVE Final    Comment: (NOTE) Interpretive Result Comment(s): COVID 19 Positive SARS CoV 2 target nucleic acids are DETECTED. The SARS CoV 2 RNA is generally detectable in upper and lower respiratory specimens during the acute phase of infection.  Positive results are indicative of active infection with SARS CoV 2.  Clinical correlation with patient history and other diagnostic information is necessary to determine patient infection status.  Positive results do not rule out bacterial infection or coinfection with other viruses. The expected result is Negative. COVID 19 Negative SARS CoV 2 target nucleic acids are NOT DETECTED. The SARS CoV 2 RNA is generally detectable in upper and lower respiratory specimens during the acute phase of infection.  Negative results do not preclude SARS CoV 2 infection, do not rule out coinfections with other pathogens, and should not be used as the sole basis for treatment or other patient management decisions.  Negative results must be combined with clinical  observations, patient history, and epidemiological information. The expected result is Negative. Invalid Presence or absence of SARS CoV 2 nucleic acids cannot be determined. Repeat testing was performed on the submitted specimen and repeated Invalid results were obtained.  If clinically indicated, additional testing on a new specimen with an alternate test methodology [phone]) is advised.  The SARS CoV 2 RNA is generally detectable in upper and lower respiratory specimens during the acute phase of infection. The expected result is Negative. Fact Sheet for Patients:  GolfingFamily.no Fact Sheet for Healthcare Providers: https://www.hernandez-brewer.com/ This test is not yet  approved or cleared by the [place] FDA and has been authorized for detection and/or diagnosis of SARS CoV 2 by FDA under an Emergency Use Authorization (EUA).  This EUA will remain in effect (meaning this test can be used) for the duration of the COVID19 d eclaration under Section 564(b)(1) of the Act, 21 U.S.C. section [phone] 3(b)(1), unless the authorization is terminated or revoked sooner. Performed at [facility], [address] [place], [place] [address]   Urine Culture     Status: None   Collection Time: 03/03/19 11:40 PM   Specimen: Urine, Random  Result Value Ref Range Status   Specimen Description   Final    URINE, RANDOM Performed at [facility], [address] [place], [place] [address]    Special Requests   Final    NONE Performed at [facility], [address] [place], [place] [address]    Culture   Final    NO GROWTH Performed at [facility], [place] [address], [place], [place] [address]    Report Status 03/05/2019 FINAL  Final         Radiology Studies: No results found.      Scheduled Meds: . amLODipine  5 mg Oral Daily  . aspirin  81 mg Oral Daily  . clopidogrel  75 mg Oral Daily  . enoxaparin (LOVENOX) injection  40 mg Subcutaneous Q24H  . memantine  7 mg Oral Daily  . pantoprazole  40 mg Oral Daily   Continuous Infusions:   LOS: 7 days      Georgette Shell, MD [facility]  If 7PM-7AM, please contact night-coverage [email or web address] Password [email or web address] 03/11/2019, 4:01 PM

## 2019-03-11 NOTE — Progress Notes (Signed)
Physical Therapy Treatment Patient Details Name: John Blackburn MRN: [id number] DOB: 04/22/1946 Today's Date: 03/11/2019    History of Present Illness Patient is a 73 year old male. Per notes sons report that over the past 2 weeks the patient has had increasing weakness. He is confused today. 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 PMH: PE, sleep apnea, skin cancer, MI, depression, GERD, HTN, allergies, OA, bilateral knee replacements     PT Comments    Patient making good progress towards goals this session. Min A for bed level mobility with patient able to follow some simple commands with this. Mod A +2 for sit to stand and stand pivot to recliner. Improved balance with pre-gait marching in place - does require Min A and consistent verbal cueing for sequencing. PT to continue to recommend SNF at discharge. Will continue to follow.     Follow Up Recommendations  SNF     Equipment Recommendations  None recommended by PT    Recommendations for Other Services       Precautions / Restrictions Precautions Precautions: Fall Restrictions Weight Bearing Restrictions: No    Mobility  Bed Mobility Overal bed mobility: Needs Assistance Bed Mobility: Supine to Sit     Supine to sit: Min assist     General bed mobility comments: cueing to bring LE towards EOB and to reach over with R UE to assist; Min A at trunk to push up into sitting  Transfers Overall transfer level: Needs assistance Equipment used: Rolling walker (2 wheeled) Transfers: Sit to/from [facility] Sit to Stand: Mod assist;+2 physical assistance Stand pivot transfers: Mod assist;+2 physical assistance       General transfer comment: Mod A +2 for sit to stand x 2 trials with RW; Mod A+2 for pivot transfer with consistent cueing for safety and sequencing  Ambulation/Gait             General Gait Details: pre-gait activity of marching in place - requires cueing for step sequencing, however  only Min A to maintain balance   Stairs             Wheelchair Mobility    Modified Rankin (Stroke Patients Only)       Balance Overall balance assessment: Needs assistance Sitting-balance support: Bilateral upper extremity supported;Feet supported Sitting balance-Leahy Scale: Fair Sitting balance - Comments: initially Min A for balance - progresion to supervision   Standing balance support: Bilateral upper extremity supported;During functional activity Standing balance-Leahy Scale: Poor Standing balance comment: reliant on UE support                             Cognition Arousal/Alertness: Awake/alert Behavior During Therapy: WFL for tasks assessed/performed;Flat affect Overall Cognitive Status: Impaired/Different from baseline Area of Impairment: Orientation;Following commands;Safety/judgement;Problem solving                 Orientation Level: Disoriented to;Place;Time;Situation     Following Commands: Follows one step commands with increased time;Follows multi-step commands inconsistently Safety/Judgement: Decreased awareness of safety;Decreased awareness of deficits   Problem Solving: Slow processing;Decreased initiation;Difficulty sequencing;Requires verbal cues;Requires tactile cues General Comments: patient able to state name, DOB, and month; otherwise not oriented; able to talk about where he used to live      Exercises      General Comments        Pertinent Vitals/Pain Pain Assessment: No/denies pain    Home Living  Prior Function            PT Goals (current goals can now be found in the care plan section) Acute Rehab PT Goals Patient Stated Goal: unable to state  PT Goal Formulation: With patient Time For Goal Achievement: 03/25/19 Potential to Achieve Goals: Fair Progress towards PT goals: Progressing toward goals    Frequency    Min 3X/week      PT Plan Current plan remains  appropriate    Co-evaluation PT/OT/SLP Co-Evaluation/Treatment: Yes Reason for Co-Treatment: Necessary to address cognition/behavior during functional activity;For patient/therapist safety;To address functional/ADL transfers PT goals addressed during session: Mobility/safety with mobility;Balance;Proper use of DME        AM-PAC PT "6 Clicks" Mobility   Outcome Measure  Help needed turning from your back to your side while in a flat bed without using bedrails?: A Little Help needed moving from lying on your back to sitting on the side of a flat bed without using bedrails?: A Little Help needed moving to and from a bed to a chair (including a wheelchair)?: A Lot Help needed standing up from a chair using your arms (e.g., wheelchair or bedside chair)?: A Lot Help needed to walk in hospital room?: A Lot Help needed climbing 3-5 steps with a railing? : Total 6 Click Score: 13    End of Session Equipment Utilized During Treatment: Gait belt Activity Tolerance: Patient tolerated treatment well Patient left: in chair;with call bell/phone within reach;with chair alarm set Nurse Communication: Mobility status PT Visit Diagnosis: Other abnormalities of gait and mobility (R26.89);Muscle weakness (generalized) (M62.81);Difficulty in walking, not elsewhere classified (R26.2)     Time: 4315-4008 PT Time Calculation (min) (ACUTE ONLY): 26 min  Charges:  $Therapeutic Activity: 8-22 mins                     Lanney Gins, PT, DPT Supplemental Physical Therapist 03/11/19 9:40 AM Pager: [phone] Office: [phone]

## 2019-03-11 NOTE — Progress Notes (Signed)
Occupational Therapy Treatment Patient Details Name: John Blackburn MRN: [id number] DOB: 1946-08-12 Today's Date: 03/11/2019    History of present illness Patient is a 73 year old male. Per notes sons report that over the past 2 weeks the patient has had increasing weakness. He is confused today. 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 PMH: PE, sleep apnea, skin cancer, MI, depression, GERD, HTN, allergies, OA, bilateral knee replacements    OT comments  Patient progressing slowly.  Requires min assist for bed mobility and mod assist +2 for simulated toilet transfers using RW.  He requires mod-max assist for LB ADLs, but demonstrating increased dynamic sitting balance and standing tolerance. Cognitively, he is oriented to self only, requires re-orientation to place throughout session, follows 1 step commands given increased time and cueing for initiation and sequencing of tasks.    Follow Up Recommendations  SNF;Supervision/Assistance - 24 hour    Equipment Recommendations  Other (comment)(TBD at next venue of care)    Recommendations for Other Services      Precautions / Restrictions Precautions Precautions: Fall Restrictions Weight Bearing Restrictions: No       Mobility Bed Mobility Overal bed mobility: Needs Assistance Bed Mobility: Supine to Sit     Supine to sit: Min assist     General bed mobility comments: cueing to bring LE towards EOB and to reach over with R UE to assist; Min A at trunk to push up into sitting  Transfers Overall transfer level: Needs assistance Equipment used: Rolling walker (2 wheeled) Transfers: Sit to/from [facility] Sit to Stand: Mod assist;+2 physical assistance Stand pivot transfers: Mod assist;+2 physical assistance       General transfer comment: Mod A +2 for sit to stand x 2 trials with RW; Mod A+2 for pivot transfer with consistent cueing for safety and sequencing    Balance Overall balance assessment: Needs  assistance Sitting-balance support: Bilateral upper extremity supported;Feet supported Sitting balance-Leahy Scale: Fair Sitting balance - Comments: initially Min A for balance - progresion to supervision   Standing balance support: Bilateral upper extremity supported;During functional activity Standing balance-Leahy Scale: Poor Standing balance comment: reliant on UE support                            ADL either performed or assessed with clinical judgement   ADL Overall ADL's : Needs assistance/impaired     Grooming: Wash/dry face;Wash/dry hands;Set up;Sitting       Lower Body Bathing: Moderate assistance;Sit to/from stand;+2 for physical assistance Lower Body Bathing Details (indicate cue type and reason): mod assist +2 sit<>stand , reliant on BUE support and decreased reach to B feet     Lower Body Dressing: Maximal assistance;Sit to/from stand;+2 for physical assistance Lower Body Dressing Details (indicate cue type and reason): mod assist +2 sit<>stand , reliant on BUE support and decreased reach to B feet Toilet Transfer: Moderate assistance;+2 for physical assistance;RW;Stand-pivot Toilet Transfer Details (indicate cue type and reason): simulated to recliner         Functional mobility during ADLs: Moderate assistance;Cueing for sequencing;+2 for physical assistance       Vision       Perception     Praxis      Cognition Arousal/Alertness: Awake/alert Behavior During Therapy: WFL for tasks assessed/performed;Flat affect Overall Cognitive Status: Impaired/Different from baseline Area of Impairment: Orientation;Following commands;Safety/judgement;Problem solving;Attention;Memory;Awareness  Orientation Level: Disoriented to;Place;Time;Situation Current Attention Level: Sustained Memory: Decreased short-term memory;Decreased recall of precautions Following Commands: Follows one step commands with increased time;Follows  multi-step commands inconsistently Safety/Judgement: Decreased awareness of safety;Decreased awareness of deficits Awareness: Intellectual Problem Solving: Slow processing;Decreased initiation;Difficulty sequencing;Requires verbal cues;Requires tactile cues General Comments: patient able to state name, DOB, and month; otherwise not oriented; requires cueing to recall place throughout session; pleasant and able to follow 1 step commands         Exercises     Shoulder Instructions       General Comments      Pertinent Vitals/ Pain       Pain Assessment: No/denies pain  Home Living                                          Prior Functioning/Environment              Frequency  Min 2X/week        Progress Toward Goals  OT Goals(current goals can now be found in the care plan section)  Progress towards OT goals: Progressing toward goals  Acute Rehab OT Goals Patient Stated Goal: none stated OT Goal Formulation: Patient unable to participate in goal setting  Plan Discharge plan remains appropriate;Frequency remains appropriate    Co-evaluation    PT/OT/SLP Co-Evaluation/Treatment: Yes Reason for Co-Treatment: Necessary to address cognition/behavior during functional activity;For patient/therapist safety;To address functional/ADL transfers PT goals addressed during session: Mobility/safety with mobility;Balance;Proper use of DME OT goals addressed during session: ADL's and self-care      AM-PAC OT "6 Clicks" Daily Activity     Outcome Measure   Help from another person eating meals?: A Little Help from another person taking care of personal grooming?: A Little Help from another person toileting, which includes using toliet, bedpan, or urinal?: A Lot Help from another person bathing (including washing, rinsing, drying)?: A Lot Help from another person to put on and taking off regular upper body clothing?: A Little Help from another person to put  on and taking off regular lower body clothing?: A Lot 6 Click Score: 15    End of Session Equipment Utilized During Treatment: Gait belt;Rolling walker  OT Visit Diagnosis: Unsteadiness on feet (R26.81);Other abnormalities of gait and mobility (R26.89);Muscle weakness (generalized) (M62.81);Other symptoms and signs involving cognitive function   Activity Tolerance Patient tolerated treatment well   Patient Left in chair;with call bell/phone within reach;with chair alarm set   Nurse Communication Mobility status        Time: 1610-9604 OT Time Calculation (min): 26 min  Charges: OT General Charges $OT Visit: 1 Visit OT Treatments $Self Care/Home Management : 8-22 mins  Delight Stare, OT [place] Pager [phone] Office [phone]    Delight Stare 03/11/2019, 11:31 AM

## 2019-03-11 NOTE — Care Management Important Message (Signed)
Important Message  Patient Details  Name: John Blackburn MRN: [id number] Date of Birth: [birth year]   Medicare Important Message Given:  Yes     Adena Sima 03/11/2019, 1:32 PM

## 2019-03-12 LAB — COMPREHENSIVE METABOLIC PANEL
ALT: 26 U/L (ref 0–44)
AST: 24 U/L (ref 15–41)
Albumin: 2.9 g/dL — ABNORMAL LOW (ref 3.5–5.0)
Alkaline Phosphatase: 81 U/L (ref 38–126)
Anion gap: 10 (ref 5–15)
BUN: 22 mg/dL (ref 8–23)
CO2: 27 mmol/L (ref 22–32)
Calcium: 9.4 mg/dL (ref 8.9–10.3)
Chloride: 101 mmol/L (ref 98–111)
Creatinine, Ser: 0.95 mg/dL (ref 0.61–1.24)
GFR calc Af Amer: >60 mL/min (ref >60)
GFR calc non Af Amer: >60 mL/min (ref >60)
Glucose, Bld: 128 mg/dL — ABNORMAL HIGH (ref 70–99)
Potassium: 3.8 mmol/L (ref 3.5–5.1)
Sodium: 138 mmol/L (ref 135–145)
Total Bilirubin: 0.6 mg/dL (ref 0.3–1.2)
Total Protein: 7.1 g/dL (ref 6.5–8.1)

## 2019-03-12 LAB — CBC WITH DIFFERENTIAL/PLATELET
Abs Immature Granulocytes: 0.07 10*3/uL (ref 0.00–0.07)
Basophils Absolute: 0.1 10*3/uL (ref 0.0–0.1)
Basophils Relative: 1 %
Eosinophils Absolute: 0.3 10*3/uL (ref 0.0–0.5)
Eosinophils Relative: 3 %
HCT: 46.1 % (ref 39.0–52.0)
Hemoglobin: 15.4 g/dL (ref 13.0–17.0)
Immature Granulocytes: 1 %
Lymphocytes Relative: 18 %
Lymphs Abs: 1.8 10*3/uL (ref 0.7–4.0)
MCH: 33.1 pg (ref 26.0–34.0)
MCHC: 33.4 g/dL (ref 30.0–36.0)
MCV: 99.1 fL (ref 80.0–100.0)
Monocytes Absolute: 0.6 10*3/uL (ref 0.1–1.0)
Monocytes Relative: 6 %
Neutro Abs: 7.2 10*3/uL (ref 1.7–7.7)
Neutrophils Relative %: 71 %
Platelets: 352 10*3/uL (ref 150–400)
RBC: 4.65 MIL/uL (ref 4.22–5.81)
RDW: 13.2 % (ref 11.5–15.5)
WBC: 10 10*3/uL (ref 4.0–10.5)
nRBC: 0 % (ref 0.0–0.2)

## 2019-03-12 MED ORDER — SENNOSIDES-DOCUSATE SODIUM 8.6-50 MG PO TABS: 1.0000 | ORAL_TABLET | Freq: Two times a day (BID) | ORAL | Status: DC | PRN

## 2019-03-12 NOTE — Progress Notes (Signed)
CM spoke to patient's son: [name] over the phone. He is understanding that pt is ready to d/c and needs to look at something other than SNF rehab. He asked that the patients information be sent to [facility] CM faxed the information as requested. They want to do a face to face with the patient tomorrow. CM will arrange over the phone. MD updated.

## 2019-03-12 NOTE — Discharge Summary (Signed)
Physician Discharge Summary  SILVANO GAROFANO QIO:[id number] DOB: July 11, 1946 DOA: 03/03/2019  PCP: Hoyt Koch, MD  Admit date: 03/03/2019 Discharge date: 03/12/2019  Admitted From: Home Disposition: SNF  Recommendations for Outpatient Follow-up:  1. Follow up with PCP in 1-2 weeks 2. Please obtain BMP/CBC in one week  Home Health: None Equipment/Devices: None none  Discharge Condition stable and improved CODE STATUS full code Diet recommendation: Cardiac Brief/Interim Summary:73 y.o.malewith medical history significant ofhypertension, hyperlipidemia, GERD, depression, CAD, stent placement, PE 2014 (not onanticoagulants currently),OSA on CPAP,dCHF, who presents with generalized weakness.  Per report, pt's son reported to EDP thatpatient has been declining recently. Patient has generalized weakness, decreased mobility, decreased appetite.Last week,patient did have a fall and hit his head and was evaluated at [facility] with a negative head CT.Pt is so week thathe has difficulty moving from chair to bed without help. Patient denies any chest pain, shortness breath.He has mild dry cough. No fever or chills. Has nausea, butno vomiting, diarrhea or abdominal pain. Patient has increased urinary frequency per his son's report.Patient denies unilateral numbness or tingling inhis extremities. No facial droop or slurred speech.Family is currently working to get the patient into a memory care unit but is still filling out paperwork.  ED Course:pt was found to have negative troponin x2, negative urinalysis, negative COVID-19 test, potassium 2.5, renal function normal, temperature 99.2, blood pressure 156/101,heart rate 73, oxygen saturation 93 to 97% on room air. 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 Pt isadmitted to telemetry bed as inpatient. ED physician discussed  with neurologist, Dr. Rory Percy, who recommended MRI for brain.  Discharge Diagnoses:  Principal Problem:   Generalized weakness Active Problems:   Hypertension   Hyperlipidemia   Obstructive sleep apnea   Chronic diastolic (congestive) heart failure (HCC)   GERD (gastroesophageal reflux disease)   Memory loss   Hypokalemia   Weakness generalized   CAD (coronary artery disease)   Generalized weakness: Secondary to severe hypokalemia with a K of 2.5 at the time of admission.No evidence of acute stroke. MRI of the brain shows-acute finding advanced chronic small vessel ischemia. B12 286 LDL 73 hemoglobin A1c 5.4 TSH 1.17 sed rate 31 CPK normal. -Patient seen by PT recommends SNF. Family appealing snf denied Patient lives alone and is an unsafe dc to home.he is not able to do ADLS without significant assistance  HTN:pt is not taking meds at homeincreaseNorvasc5 mg daily.bp better. -IV hydralazine prn  Hyperlipidemia: -lipitor  Obstructive sleep apnea: -CPAP  Chronic diastolic (congestive) heart failure (HCC):2D echo on 08/30/2017 showed EF of 55-60% with grade 1 diastolic dysfunction. Patient does not have leg edema or JVD. No shortness of breath. CHF is compensated.   GERD (gastroesophageal reflux disease): -Protonix  Memory loss: -Namenda  Hx ofCAD (coronary artery disease):s/p of stent. No chest pain. Trop negative x 2 -Continue Lipitor, aspirin, Plavix -PRN nitroglycerin  Depression and anxiety:continue lexapro and wellbutrin Estimated body mass index is 30.59 kg/m as calculated from the following:   Height as of this encounter: 6' (1.829 m).   Weight as of this encounter: 102.3 kg.  Discharge Instructions  Discharge Instructions    Call MD for:  difficulty breathing, headache or visual disturbances   Complete by: As directed    Call MD for:  difficulty breathing, headache or visual disturbances   Complete by: As directed    Call MD  for:  persistant  dizziness or light-headedness   Complete by: As directed    Call MD for:  persistant nausea and vomiting   Complete by: As directed    Call MD for:  persistant nausea and vomiting   Complete by: As directed    Call MD for:  severe uncontrolled pain   Complete by: As directed    Diet - low sodium heart healthy   Complete by: As directed    Diet - low sodium heart healthy   Complete by: As directed    Increase activity slowly   Complete by: As directed    Increase activity slowly   Complete by: As directed      Allergies as of 03/12/2019      Reactions   Cephalexin Rash   Levofloxacin Rash      Medication List    TAKE these medications   amLODipine 5 MG tablet Commonly known as: NORVASC Take 1 tablet (5 mg total) by mouth daily.   aspirin 81 MG EC tablet Take 1 tablet (81 mg total) by mouth daily.   atorvastatin 80 MG tablet Commonly known as: LIPITOR Take 1 tablet (80 mg total) by mouth daily at 6 PM. KEEP OV. What changed: when to take this   buPROPion 300 MG 24 hr tablet Commonly known as: WELLBUTRIN XL TAKE 1 TABLET BY MOUTH ONCE DAILY   clopidogrel 75 MG tablet Commonly known as: PLAVIX Take 1 tablet (75 mg total) by mouth daily. Patient will be scheduled for AUGUST 2020.   escitalopram 10 MG tablet Commonly known as: LEXAPRO Take 1 tablet by mouth once daily   loratadine 10 MG tablet Commonly known as: CLARITIN Take 1 tablet (10 mg total) by mouth daily as needed for allergies.   memantine 7 MG Cp24 24 hr capsule Commonly known as: NAMENDA XR TAKE 1  BY MOUTH ONCE DAILY What changed: See the new instructions.   nitroGLYCERIN 0.4 MG SL tablet Commonly known as: Nitrostat Place 1 tablet (0.4 mg total) under the tongue every 5 (five) minutes as needed.   pantoprazole 40 MG tablet Commonly known as: PROTONIX Take 1 tablet (40 mg total) by mouth daily. Patient will be scheduled for AUGUST 2020.   senna-docusate 8.6-50 MG  tablet Commonly known as: Senokot-S Take 1 tablet by mouth 2 (two) times daily as needed for mild constipation.       Contact information for follow-up providers    Hoyt Koch, MD Follow up.   Specialty: Internal Medicine Contact information: [place] [address] [phone]        Lorretta Harp, MD .   Specialties: Cardiology, Radiology Contact information: [address] [place] [address] [phone]            Contact information for after-discharge care    Destination    [facility] Preferred SNF .   Service: Skilled Nursing Contact information: [place] [phone]                 Allergies  Allergen Reactions  . Cephalexin Rash  . Levofloxacin Rash    Consultations: None  Procedures/Studies: Ct Head Wo Contrast  Result Date: 03/03/2019 CLINICAL DATA:  Altered level of consciousness, history coronary artery disease, hypertension EXAM: CT HEAD WITHOUT CONTRAST TECHNIQUE: Contiguous axial images were obtained from the base of the skull through the vertex without intravenous contrast. Sagittal and coronal MPR images reconstructed from axial data set. COMPARISON:  02/21/2019 FINDINGS: Brain: Generalized atrophy. Normal ventricular morphology. No midline shift or mass effect. Small vessel chronic ischemic changes of deep cerebral white matter. Old lacunar infarcts at the basal ganglia and LEFT thalamus with extension into RIGHT periventricular white matter. No intracranial hemorrhage or mass lesion. Subtle area of new low-attenuation at the LEFT occipital lobe suspicious for acute infarct. No extra-axial fluid collections. Vascular: Mild atherosclerotic calcifications within vertebral and carotid arteries at skull base. Skull: Grossly intact, scattered motion artifacts noted Sinuses/Orbits: Clear Other: N/A IMPRESSION: Atrophy with small vessel  chronic ischemic changes of deep cerebral white matter. 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 New area of subtle low attenuation in the LEFT occipital lobe suspicious for acute infarct. No intracranial hemorrhage. Electronically Signed   By: Lavonia Dana M.D.   On: 03/03/2019 22:56   Ct Head Wo Contrast  Result Date: 02/21/2019 CLINICAL DATA:  Fall.  Head injury. EXAM: CT HEAD WITHOUT CONTRAST CT CERVICAL SPINE WITHOUT CONTRAST TECHNIQUE: Multidetector CT imaging of the head and cervical spine was performed following the standard protocol without intravenous contrast. Multiplanar CT image reconstructions of the cervical spine were also generated. COMPARISON:  CT head 04/23/2018 FINDINGS: CT HEAD FINDINGS Brain: Generalized atrophy. Chronic microvascular ischemic change throughout the white matter and basal ganglia. No acute infarct. Negative for hemorrhage or mass. No midline shift. No change from the prior. Vascular: Negative for hyperdense vessel. Atherosclerotic calcification. Skull: Negative for skull fracture Sinuses/Orbits: Mild mucosal edema paranasal sinuses. Negative orbit. Other: None CT CERVICAL SPINE FINDINGS Alignment: Mild retrolisthesis C3-4 and C4-5 Skull base and vertebrae: Negative for fracture Soft tissues and spinal canal: Negative Disc levels: Multilevel disc degeneration and spurring in the cervical spine. Multilevel spinal and foraminal stenosis due to spurring. Upper chest: Negative Other: None IMPRESSION: 1. No acute intracranial abnormality. Atrophy with extensive chronic microvascular ischemia 2. Negative for cervical spine fracture. Moderate cervical spondylosis. Electronically Signed   By: Franchot Gallo M.D.   On: 02/21/2019 18:44   Ct Cervical Spine Wo Contrast  Result Date: 02/21/2019 CLINICAL DATA:  Fall.  Head injury. EXAM: CT HEAD WITHOUT CONTRAST CT CERVICAL SPINE WITHOUT CONTRAST TECHNIQUE: Multidetector CT imaging of the head and cervical spine was performed following the  standard protocol without intravenous contrast. Multiplanar CT image reconstructions of the cervical spine were also generated. COMPARISON:  CT head 04/23/2018 FINDINGS: CT HEAD FINDINGS Brain: Generalized atrophy. Chronic microvascular ischemic change throughout the white matter and basal ganglia. No acute infarct. Negative for hemorrhage or mass. No midline shift. No change from the prior. Vascular: Negative for hyperdense vessel. Atherosclerotic calcification. Skull: Negative for skull fracture Sinuses/Orbits: Mild mucosal edema paranasal sinuses. Negative orbit. Other: None CT CERVICAL SPINE FINDINGS Alignment: Mild retrolisthesis C3-4 and C4-5 Skull base and vertebrae: Negative for fracture Soft tissues and spinal canal: Negative Disc levels: Multilevel disc degeneration and spurring in the cervical spine. Multilevel spinal and foraminal stenosis due to spurring. Upper chest: Negative Other: None IMPRESSION: 1. No acute intracranial abnormality. Atrophy with extensive chronic microvascular ischemia 2. Negative for cervical spine fracture. Moderate cervical spondylosis. Electronically Signed   By: Franchot Gallo M.D.   On: 02/21/2019 18:44   Mr Brain Wo Contrast  Result Date: 03/04/2019 CLINICAL DATA:  Focal neuro deficit EXAM: MRI HEAD WITHOUT CONTRAST TECHNIQUE: Multiplanar, multiecho pulse sequences of the brain and surrounding structures were obtained without intravenous contrast. COMPARISON:  Head CT from yesterday.  Brain MRI 07/01/2018 FINDINGS: Brain: No acute infarction, hemorrhage, hydrocephalus, extra-axial collection or mass lesion. Advanced  chronic small vessel ischemia with confluent gliosis in the deep white matter and across the bilateral pons. There are accentuated dilated perivascular spaces with remote bilateral thalamic infarcts and a right lateral lenticulostriate basal ganglia infarct. Chronic blood products in the right parietal lobe. Mild cerebral volume loss. Vascular: Major flow  voids are preserved. Skull and upper cervical spine: Negative for marrow lesion Sinuses/Orbits: Negative IMPRESSION: 1. No acute finding. 2. Advanced chronic small vessel ischemia. Electronically Signed   By: Monte Fantasia M.D.   On: 03/04/2019 07:01   Dg Chest Port 1 View  Result Date: 03/03/2019 CLINICAL DATA:  Cough EXAM: PORTABLE CHEST 1 VIEW COMPARISON:  04/23/2018 FINDINGS: Heart is borderline in size. No confluent airspace opacities. Blunting of the left costophrenic angle may reflect small left effusion or pleural thickening. IMPRESSION: Small left effusion versus pleural thickening. Electronically Signed   By: Rolm Baptise M.D.   On: 03/03/2019 22:58   Dg Knee Complete 4 Views Left  Result Date: 02/21/2019 CLINICAL DATA:  Fall.  Pain EXAM: LEFT KNEE - COMPLETE 4+ VIEW COMPARISON:  None. FINDINGS: Total knee replacement in satisfactory position alignment. No fracture or joint effusion. IMPRESSION: No acute abnormality. Electronically Signed   By: Franchot Gallo M.D.   On: 02/21/2019 18:46   Dg Hip Unilat W Or Wo Pelvis 2-3 Views Left  Result Date: 02/21/2019 CLINICAL DATA:  Fall.  Left hip pain EXAM: DG HIP (WITH OR WITHOUT PELVIS) 2-3V LEFT COMPARISON:  None. FINDINGS: Negative for fracture. Mild degenerative change in the left hip and moderate degenerative change in the right hip. Degenerative change in the lumbar spine. IMPRESSION: Negative for fracture. Electronically Signed   By: Franchot Gallo M.D.   On: 02/21/2019 18:45    (Echo, Carotid, EGD, Colonoscopy, ERCP)    Subjective: Patient is resting in bed no new complaints  Discharge Exam: Vitals:   03/12/19 0341 03/12/19 0832  BP: 140/73 123/80  Pulse: 81 91  Resp: 18 17  Temp: (!) 97.5 F (36.4 C) 99 F (37.2 C)  SpO2: 97% 95%   Vitals:   03/11/19 1933 03/11/19 2308 03/12/19 0341 03/12/19 0832  BP: 134/83 132/82 140/73 123/80  Pulse: 86 80 81 91  Resp: 18 18 18 17   Temp: 98.2 F (36.8 C) (!) 97.5 F (36.4 C) (!)  97.5 F (36.4 C) 99 F (37.2 C)  TempSrc: Oral Oral Oral Oral  SpO2: 97% 98% 97% 95%  Weight:      Height:        General: Pt is alert, awake, not in acute distress Cardiovascular: RRR, S1/S2 +, no rubs, no gallops Respiratory: CTA bilaterally, no wheezing, no rhonchi Abdominal: Soft, NT, ND, bowel sounds + Extremities: no edema, no cyanosis    The results of significant diagnostics from this hospitalization (including imaging, microbiology, ancillary and laboratory) are listed below for reference.     Microbiology: Recent Results (from the past 240 hour(s))  Blood culture (routine x 2)     Status: None   Collection Time: 03/03/19 10:20 PM   Specimen: BLOOD LEFT FOREARM  Result Value Ref Range Status   Specimen Description   Final    BLOOD LEFT FOREARM Performed at [facility], [address] [place], [place] [address]    Special Requests   Final    BOTTLES DRAWN AEROBIC AND ANAEROBIC Blood Culture adequate volume Performed at [facility], [address] [place], [place] [address]    Culture  Final    NO GROWTH 5 DAYS Performed at [facility], [place] [address], [place], [place] [address]    Report Status 03/09/2019 FINAL  Final  Blood culture (routine x 2)     Status: None   Collection Time: 03/03/19 10:20 PM   Specimen: BLOOD RIGHT ARM  Result Value Ref Range Status   Specimen Description   Final    BLOOD RIGHT ARM Performed at [facility], [address] [place], [place] [address]    Special Requests   Final    BOTTLES DRAWN AEROBIC AND ANAEROBIC Blood Culture adequate volume Performed at [facility], [address], [place], [place] [address]    Culture   Final    NO GROWTH 5 DAYS Performed at [facility], [place] [address], [place], [place] [address]    Report Status 03/09/2019 FINAL  Final  SARS Coronavirus 2 (Hosp order,Performed in [facility] lab via Abbott ID)     Status: None   Collection  Time: 03/03/19 10:24 PM   Specimen: Dry Nasal Swab (Abbott ID Now)  Result Value Ref Range Status   SARS Coronavirus 2 (Abbott ID Now) NEGATIVE NEGATIVE Final    Comment: (NOTE) Interpretive Result Comment(s): COVID 19 Positive SARS CoV 2 target nucleic acids are DETECTED. The SARS CoV 2 RNA is generally detectable in upper and lower respiratory specimens during the acute phase of infection.  Positive results are indicative of active infection with SARS CoV 2.  Clinical correlation with patient history and other diagnostic information is necessary to determine patient infection status.  Positive results do not rule out bacterial infection or coinfection with other viruses. The expected result is Negative. COVID 19 Negative SARS CoV 2 target nucleic acids are NOT DETECTED. The SARS CoV 2 RNA is generally detectable in upper and lower respiratory specimens during the acute phase of infection.  Negative results do not preclude SARS CoV 2 infection, do not rule out coinfections with other pathogens, and should not be used as the sole basis for treatment or other patient management decisions.  Negative results must be combined with clinical  observations, patient history, and epidemiological information. The expected result is Negative. Invalid Presence or absence of SARS CoV 2 nucleic acids cannot be determined. Repeat testing was performed on the submitted specimen and repeated Invalid results were obtained.  If clinically indicated, additional testing on a new specimen with an alternate test methodology [phone]) is advised.  The SARS CoV 2 RNA is generally detectable in upper and lower respiratory specimens during the acute phase of infection. The expected result is Negative. Fact Sheet for Patients:  GolfingFamily.no Fact Sheet for Healthcare Providers: https://www.hernandez-brewer.com/ This test is not yet approved or cleared by the [place] FDA and has been authorized for detection and/or diagnosis of SARS CoV 2 by FDA under an Emergency Use Authorization (EUA).  This EUA will remain in effect (meaning this test can be used) for the duration of the COVID19 d eclaration under Section 564(b)(1) of the Act, 21 U.S.C. section [phone] 3(b)(1), unless the authorization is terminated or revoked sooner. Performed at [facility], [address] [place], [place] [address]   Urine Culture     Status: None   Collection Time: 03/03/19 11:40 PM   Specimen: Urine, Random  Result Value Ref Range Status   Specimen Description   Final    URINE, RANDOM Performed at [facility]  [address], [address], [place], [place] [address]    Special Requests   Final    NONE Performed at [facility], [address] [place], [place] [address]    Culture   Final    NO GROWTH Performed at [facility], [place] [address] [place], [place] [address]    Report Status 03/05/2019 FINAL  Final     Labs: BNP (last 3 results) Recent Labs    03/04/19 0455  BNP 914.7*   Basic Metabolic Panel: Recent Labs  Lab 03/06/19 0518 03/12/19 0519  NA 137 138  K 3.9 3.8  CL 101 101  CO2 26 27  GLUCOSE 110* 128*  BUN 16 22  CREATININE 0.82 0.95  CALCIUM 9.4 9.4   Liver Function Tests: Recent Labs  Lab 03/12/19 0519  AST 24  ALT 26  ALKPHOS 81  BILITOT 0.6  PROT 7.1  ALBUMIN 2.9*   No results for input(s): LIPASE, AMYLASE in the last 168 hours. No results for input(s): AMMONIA in the last 168 hours. CBC: Recent Labs  Lab 03/12/19 0519  WBC 10.0  NEUTROABS 7.2  HGB 15.4  HCT 46.1  MCV 99.1  PLT 352   Cardiac Enzymes: No results for input(s): CKTOTAL, CKMB, CKMBINDEX, TROPONINI in the last 168 hours. BNP: Invalid input(s): POCBNP CBG: No results for input(s): GLUCAP in the last 168 hours. D-Dimer No results for input(s): DDIMER in the last 72 hours. Hgb A1c No results for input(s): HGBA1C  in the last 72 hours. Lipid Profile No results for input(s): CHOL, HDL, LDLCALC, TRIG, CHOLHDL, LDLDIRECT in the last 72 hours. Thyroid function studies No results for input(s): TSH, T4TOTAL, T3FREE, THYROIDAB in the last 72 hours.  Invalid input(s): FREET3 Anemia work up No results for input(s): VITAMINB12, FOLATE, FERRITIN, TIBC, IRON, RETICCTPCT in the last 72 hours. Urinalysis    Component Value Date/Time   COLORURINE YELLOW 03/03/2019 2340   APPEARANCEUR CLEAR 03/03/2019 2340   LABSPEC 1.025 03/03/2019 2340   PHURINE 6.0 03/03/2019 2340   GLUCOSEU NEGATIVE 03/03/2019 2340   HGBUR TRACE (A) 03/03/2019 2340   BILIRUBINUR NEGATIVE 03/03/2019 2340   BILIRUBINUR negative 09/13/2016 1816   BILIRUBINUR neg 12/06/2011 1743   KETONESUR NEGATIVE 03/03/2019 2340   PROTEINUR 30 (A) 03/03/2019 2340   UROBILINOGEN 0.2 09/13/2016 1816   NITRITE NEGATIVE 03/03/2019 2340   LEUKOCYTESUR NEGATIVE 03/03/2019 2340   Sepsis Labs Invalid input(s): PROCALCITONIN,  WBC,  LACTICIDVEN Microbiology Recent Results (from the past 240 hour(s))  Blood culture (routine x 2)     Status: None   Collection Time: 03/03/19 10:20 PM   Specimen: BLOOD LEFT FOREARM  Result Value Ref Range Status   Specimen Description   Final    BLOOD LEFT FOREARM Performed at [facility], [place] [place], [place] [address]    Special Requests   Final    BOTTLES DRAWN AEROBIC AND ANAEROBIC Blood Culture adequate volume Performed at [facility], [address] [place], [place] [address]    Culture   Final    NO GROWTH 5 DAYS Performed at [facility], [place] [address] [place], [place] [address]    Report Status 03/09/2019 FINAL  Final  Blood culture (routine x 2)     Status: None   Collection Time: 03/03/19 10:20 PM   Specimen: BLOOD RIGHT ARM  Result Value Ref Range Status   Specimen Description   Final  BLOOD RIGHT ARM Performed at [facility], [address] [place], [place] [address]    Special Requests   Final    BOTTLES DRAWN AEROBIC AND ANAEROBIC Blood Culture adequate volume Performed at [facility], [address] [place], [place] [address]    Culture   Final    NO GROWTH 5 DAYS Performed at [facility], [place] [address] [place], [place] [address]    Report Status 03/09/2019 FINAL  Final  SARS Coronavirus 2 (Hosp order,Performed in [facility] lab via Abbott ID)     Status: None   Collection Time: 03/03/19 10:24 PM   Specimen: Dry Nasal Swab (Abbott ID Now)  Result Value Ref Range Status   SARS Coronavirus 2 (Abbott ID Now) NEGATIVE NEGATIVE Final    Comment: (NOTE) Interpretive Result Comment(s): COVID 19 Positive SARS CoV 2 target nucleic acids are DETECTED. The SARS CoV 2 RNA is generally detectable in upper and lower respiratory specimens during the acute phase of infection.  Positive results are indicative of active infection with SARS CoV 2.  Clinical correlation with patient history and other diagnostic information is necessary to determine patient infection status.  Positive results do not rule out bacterial infection or coinfection with other viruses. The expected result is Negative. COVID 19 Negative SARS CoV 2 target nucleic acids are NOT DETECTED. The SARS CoV 2 RNA is generally detectable in upper and lower respiratory specimens during the acute phase of infection.  Negative results do not preclude SARS CoV 2 infection, do not rule out coinfections with other pathogens, and should not be used as the sole basis for treatment or other patient management decisions.  Negative results must be combined with clinical  observations, patient history, and epidemiological information. The expected result is Negative. Invalid Presence or absence of SARS CoV 2 nucleic acids cannot be determined. Repeat testing was performed on the submitted specimen and repeated Invalid results were obtained.  If  clinically indicated, additional testing on a new specimen with an alternate test methodology [phone]) is advised.  The SARS CoV 2 RNA is generally detectable in upper and lower respiratory specimens during the acute phase of infection. The expected result is Negative. Fact Sheet for Patients:  GolfingFamily.no Fact Sheet for Healthcare Providers: https://www.hernandez-brewer.com/ This test is not yet approved or cleared by the [place] FDA and has been authorized for detection and/or diagnosis of SARS CoV 2 by FDA under an Emergency Use Authorization (EUA).  This EUA will remain in effect (meaning this test can be used) for the duration of the COVID19 d eclaration under Section 564(b)(1) of the Act, 21 U.S.C. section [phone] 3(b)(1), unless the authorization is terminated or revoked sooner. Performed at [facility], [address], [place], [place] [address]   Urine Culture     Status: None   Collection Time: 03/03/19 11:40 PM   Specimen: Urine, Random  Result Value Ref Range Status   Specimen Description   Final    URINE, RANDOM Performed at [facility], [address] [place], [place] [address]    Special Requests   Final    NONE Performed at [facility], [address] [place], [place] [address]    Culture   Final    NO GROWTH Performed at [facility], [place] [address] [place], [place] [address]    Report Status 03/05/2019 FINAL  Final  Time coordinating discharge:  34 minutes  SIGNED:   Georgette Shell, MD  [facility] 03/12/2019, 10:04 AM Pager   If 7PM-7AM, please contact night-coverage [email or web address] Password [email or web address]

## 2019-03-12 NOTE — Progress Notes (Signed)
Physical Therapy Treatment Patient Details Name: John Blackburn MRN: [id number] DOB: 02-18-1946 Today's Date: 03/12/2019    History of Present Illness Patient is a 73 year old male. Per notes sons report that over the past 2 weeks the patient has had increasing weakness. He is confused today. 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 PMH: PE, sleep apnea, skin cancer, MI, depression, GERD, HTN, allergies, OA, bilateral knee replacements     PT Comments    Pt was able to stand EOB with me multiple times with RW today. He still needs two people to safely transfer to the recliner chair and cannot tolerate gait progression due to how quickly he fatigues and needs to sit down.   He did preform sit to stand 6 times before he transferred and has improved sitting balance with practice.    Follow Up Recommendations  SNF     Equipment Recommendations  None recommended by PT    Recommendations for Other Services   NA     Precautions / Restrictions Precautions Precautions: Fall    Mobility  Bed Mobility Overal bed mobility: Needs Assistance Bed Mobility: Supine to Sit     Supine to sit: Mod assist;HOB elevated     General bed mobility comments: Mod assist to support trunk, initiate movement to EOB, and verbal and tactile cues for sequencing and bed rail use.   Transfers Overall transfer level: Needs assistance Equipment used: Rolling walker (2 wheeled) Transfers: Sit to/from [facility] Sit to Stand: Mod assist;From elevated surface Stand pivot transfers: Mod assist;+2 safety/equipment;From elevated surface       General transfer comment: Mod assist to stand from elevated surface x6 (initially for practice, then for pericare). Second person used for safety for pivot to chair with RW.  Pt attempting to sit prematurely and then with uncontrolled descent.       Balance Overall balance assessment: Needs assistance Sitting-balance support: Feet supported;Bilateral  upper extremity supported Sitting balance-Leahy Scale: Poor Sitting balance - Comments: can get as low as supervision with bil UE support (and actually sitting balance improved after multiple attempts at standing).  Mod assist initially due to left posterior lean, pt unable to report the direction he is leaning even if allowed to fall over.  Postural control: Posterior lean;Left lateral lean Standing balance support: Bilateral upper extremity supported;During functional activity Standing balance-Leahy Scale: Poor Standing balance comment: needs support from RW and therapist.                             Cognition Arousal/Alertness: Awake/alert Behavior During Therapy: WFL for tasks assessed/performed Overall Cognitive Status: Impaired/Different from baseline Area of Impairment: Orientation;Attention;Memory;Following commands;Safety/judgement;Awareness;Problem solving                 Orientation Level: Disoriented to;Time Current Attention Level: Sustained Memory: Decreased short-term memory(pt kept asking me where I am from) Following Commands: Follows one step commands with increased time;Follows one step commands inconsistently Safety/Judgement: Decreased awareness of safety;Decreased awareness of deficits Awareness: Intellectual Problem Solving: Decreased initiation;Difficulty sequencing;Requires verbal cues General Comments: Pt able to tell me his name, kept asking me the same question every 5 mins or so throughout the session, has poor initiation, and needs time to process even basic commands.              Pertinent Vitals/Pain Pain Assessment: No/denies pain           PT Goals (current goals can  now be found in the care plan section) Acute Rehab PT Goals Patient Stated Goal: none stated Progress towards PT goals: Progressing toward goals    Frequency    Min 3X/week      PT Plan Current plan remains appropriate       AM-PAC PT "6 Clicks"  Mobility   Outcome Measure  Help needed turning from your back to your side while in a flat bed without using bedrails?: A Lot Help needed moving from lying on your back to sitting on the side of a flat bed without using bedrails?: A Lot Help needed moving to and from a bed to a chair (including a wheelchair)?: A Lot Help needed standing up from a chair using your arms (e.g., wheelchair or bedside chair)?: A Lot Help needed to walk in hospital room?: A Lot Help needed climbing 3-5 steps with a railing? : Total 6 Click Score: 11    End of Session Equipment Utilized During Treatment: Gait belt Activity Tolerance: Patient limited by fatigue Patient left: in chair;with call bell/phone within reach;with chair alarm set Nurse Communication: Mobility status(to Engineer, manufacturing) PT Visit Diagnosis: Other abnormalities of gait and mobility (R26.89);Muscle weakness (generalized) (M62.81);Difficulty in walking, not elsewhere classified (R26.2)     Time: 2536-6440 PT Time Calculation (min) (ACUTE ONLY): 21 min  Charges:  $Therapeutic Activity: 8-22 mins                    Aiyah Scarpelli B. Johnice Riebe, PT, DPT  Acute Rehabilitation [phone] pager [phone] office  @ [facility]: [phone]   03/12/2019, 12:42 PM

## 2019-03-12 NOTE — NC FL2 (Signed)
[place] LEVEL OF CARE SCREENING TOOL     IDENTIFICATION  Patient Name: John Blackburn Birthdate: 03/05/1946 Sex: male Admission Date (Current Location): 03/03/2019  [facility] and [place] Number:  Herbalist and Address:  [facility], [place] [address], [place], [place] [address]      Provider Number: [id number]  Attending Physician Name and Address:  Georgette Shell, MD  Relative Name and Phone Number:       Current Level of Care: Other (Comment)(ALF- Memory care) Recommended Level of Care: [place], Memory Care Prior Approval Number:    Date Approved/Denied:   PASRR Number: [id number]  Discharge Plan: Other (Comment)(ALF-Memory Care)    Current Diagnoses: Patient Active Problem List   Diagnosis Date Noted  . Weakness generalized 03/04/2019  . CAD (coronary artery disease) 03/04/2019  . Generalized weakness 03/04/2019  . Vascular dementia ([place]) 07/03/2018  . ARF (acute renal failure) ([place]) 04/23/2018  . Hypokalemia 04/23/2018  . Anorexia 04/23/2018  . Leucocytosis 04/23/2018  . EKG abnormalities 04/23/2018  . B12 deficiency 04/12/2018  . Memory loss 11/28/2017  . Balance problem 11/28/2017  . skin cancer   . Sleep apnea, primary central   . GERD (gastroesophageal reflux disease)   . MDD (major depressive disorder), single episode, moderate ([place])   . CAD S/P percutaneous coronary angioplasty 09/02/2017  . Chronic diastolic (congestive) heart failure ([place]) 08/30/2017  . Elevated blood sugar 01/17/2017  . Incontinence of feces   . Allergic rhinitis 10/15/2013  . Chronic venous insufficiency 07/20/2013  . History of pulmonary embolism 07/08/2013  . ED (erectile dysfunction) 06/01/2011  . Arthritis   . Hypertension   . Hyperlipidemia   . Obstructive sleep apnea     Orientation RESPIRATION BLADDER Height & Weight     Self, Place  Normal Incontinent Weight: 102.3 kg Height:  6' (182.9 cm)   BEHAVIORAL SYMPTOMS/MOOD NEUROLOGICAL BOWEL NUTRITION STATUS      Incontinent Diet(heart health with thin liquids)  AMBULATORY STATUS COMMUNICATION OF NEEDS Skin   Extensive Assist Verbally Normal                       Personal Care Assistance Level of Assistance  Bathing, Feeding, Dressing Bathing Assistance: Limited assistance Feeding assistance: Independent Dressing Assistance: Limited assistance     Functional Limitations Info  Sight, Hearing, Speech Sight Info: Adequate Hearing Info: Adequate Speech Info: Adequate    SPECIAL CARE FACTORS FREQUENCY  PT (By licensed PT), OT (By licensed OT), Speech therapy     PT Frequency: 3x/wk OT Frequency: 3x/wk     Speech Therapy Frequency: 3x/wk      Contractures Contractures Info: Not present    Additional Factors Info  Code Status, Allergies Code Status Info: Full Allergies Info: Cephaxelin/ Levofloxacin Psychotropic Info: Namenda XR 7 mg daily         Current Medications (03/12/2019):  This is the current hospital active medication list Current Facility-Administered Medications  Medication Dose Route Frequency Provider Last Rate Last Dose  . acetaminophen (TYLENOL) tablet 650 mg  650 mg Oral Q6H PRN Ivor Costa, MD       Or  . acetaminophen (TYLENOL) suppository 650 mg  650 mg Rectal Q6H PRN Ivor Costa, MD      . amLODipine (NORVASC) tablet 5 mg  5 mg Oral Daily Georgette Shell, MD   5 mg at 03/12/19 1308  . aspirin chewable tablet 81 mg  81  mg Oral Daily Ivor Costa, MD   81 mg at 03/12/19 0951  . clopidogrel (PLAVIX) tablet 75 mg  75 mg Oral Daily Ivor Costa, MD   75 mg at 03/12/19 2641  . enoxaparin (LOVENOX) injection 40 mg  40 mg Subcutaneous Q24H Ivor Costa, MD   40 mg at 03/12/19 0549  . hydrALAZINE (APRESOLINE) injection 5 mg  5 mg Intravenous Q2H PRN Ivor Costa, MD      . loratadine (CLARITIN) tablet 10 mg  10 mg Oral Daily PRN Ivor Costa, MD      . Melatonin TABS 3 mg  3 mg Oral QHS PRN Ivor Costa, MD       . memantine (NAMENDA XR) 24 hr capsule 7 mg  7 mg Oral Daily Ivor Costa, MD   7 mg at 03/12/19 0951  . nitroGLYCERIN (NITROSTAT) SL tablet 0.4 mg  0.4 mg Sublingual Q5 min PRN Ivor Costa, MD      . pantoprazole (PROTONIX) EC tablet 40 mg  40 mg Oral Daily Ivor Costa, MD   40 mg at 03/12/19 5830  . senna-docusate (Senokot-S) tablet 1 tablet  1 tablet Oral BID PRN Georgette Shell, MD         Discharge Medications: Please see discharge summary for a list of discharge medications.  Relevant Imaging Results:  Relevant Lab Results:   Additional Information SS#: [id number]  Pollie Friar, RN

## 2019-03-13 DIAGNOSIS — E785 Hyperlipidemia, unspecified: Secondary | ICD-10-CM

## 2019-03-13 LAB — NOVEL CORONAVIRUS, NAA (HOSP ORDER, SEND-OUT TO REF LAB; TAT 18-24 HRS): SARS-CoV-2, NAA: NOT DETECTED

## 2019-03-13 MED ORDER — TUBERCULIN PPD 5 UNIT/0.1ML ID SOLN
5.0000 [IU] | Freq: Once | INTRADERMAL | Status: AC
  Administered 2019-03-13: 5 [IU] via INTRADERMAL
  Filled 2019-03-13: qty 0.1

## 2019-03-13 NOTE — Progress Notes (Signed)
Patient has mittens on.  Patient would not be able to effectively remove CPAP mask.  Patient not placed on CPAP at this time.  Patient vitals stable.  RT will continue to monitor.

## 2019-03-13 NOTE — Progress Notes (Signed)
PROGRESS NOTE    SANFORD LINDBLAD  GHW:[id number] DOB: October 23, 1945 DOA: 03/03/2019 PCP: Hoyt Koch, MD   Brief Narrative: John Blackburn is a 73 y.o. medical history significant ofhypertension, hyperlipidemia, GERD, depression, CAD, stent placement, PE 2014 (not onanticoagulants currently),OSA on CPAP,dCHF. Patient presented with generalized weakness.   Assessment & Plan:   Principal Problem:   Generalized weakness Active Problems:   Hypertension   Hyperlipidemia   Obstructive sleep apnea   Chronic diastolic (congestive) heart failure (HCC)   GERD (gastroesophageal reflux disease)   Memory loss   Hypokalemia   Weakness generalized   CAD (coronary artery disease)   Generalized weakness Initial concern for stroke, with a negative MRI. TSH normal. Patient unsafe for discharge home secondary to inability to perform ADLs and he lives alone -Plan for discharge to ALF at this time  Essential hypertension Mostly controlled. -Continue amlodipine  Obstructive sleep apnea -Continue CPAP qhs  Chronic diastolic heart failure Stable.  GERD -Continue Protonix  Memory loss Patient is on Namenda  Depression/anxiety Continue Lexapro and Wellbutrin XL  DVT prophylaxis: Lovenox Code Status:   Code Status: Full Code Family Communication: None Disposition Plan: unsafe discharge at this time. Unable to discharge to SNF secondary to insurance denial. Plan now for discharge to ALF if able.  Obesity Body mass index is 30.59 kg/m.   Consultants:   None  Procedures:   None  Antimicrobials:  None    Subjective: No concerns overnight  Objective: Vitals:   03/13/19 0008 03/13/19 0423 03/13/19 0828 03/13/19 1209  BP: (!) 155/87 (!) 147/93 125/83 130/87  Pulse: 81 79 80 79  Resp: 15 17 17 16   Temp: 98.5 F (36.9 C) 97.8 F (36.6 C) 98.5 F (36.9 C) 98.1 F (36.7 C)  TempSrc: Oral Oral Oral Oral  SpO2: 98% 95% 97% 99%  Weight:      Height:         Intake/Output Summary (Last 24 hours) at 03/13/2019 1439 Last data filed at 03/13/2019 0930 Gross per 24 hour  Intake -  Output 1000 ml  Net -1000 ml   Filed Weights   03/03/19 2154 03/04/19 0657  Weight: 100.7 kg 102.3 kg    Examination:  General exam: Appears calm and comfortable Respiratory system: Clear to auscultation. Respiratory effort normal. Cardiovascular system: S1 & S2 heard, RRR. No murmurs, rubs, gallops or clicks. Gastrointestinal system: Abdomen is nondistended, soft and nontender. No organomegaly or masses felt. Normal bowel sounds heard. Central nervous system: Alert Extremities: No edema. No calf tenderness Skin: No cyanosis. No rashes Psychiatry: Judgement and insight appear normal. Mood & affect appropriate.     Data Reviewed: I have personally reviewed following labs and imaging studies  CBC: Recent Labs  Lab 03/12/19 0519  WBC 10.0  NEUTROABS 7.2  HGB 15.4  HCT 46.1  MCV 99.1  PLT 789   Basic Metabolic Panel: Recent Labs  Lab 03/12/19 0519  NA 138  K 3.8  CL 101  CO2 27  GLUCOSE 128*  BUN 22  CREATININE 0.95  CALCIUM 9.4   GFR: Estimated Creatinine Clearance: 87 mL/min (by C-G formula based on SCr of 0.95 mg/dL). Liver Function Tests: Recent Labs  Lab 03/12/19 0519  AST 24  ALT 26  ALKPHOS 81  BILITOT 0.6  PROT 7.1  ALBUMIN 2.9*   No results for input(s): LIPASE, AMYLASE in the last 168 hours. No results for input(s): AMMONIA in the last 168 hours. Coagulation Profile: No results for input(s):  INR, PROTIME in the last 168 hours. Cardiac Enzymes: No results for input(s): CKTOTAL, CKMB, CKMBINDEX, TROPONINI in the last 168 hours. BNP (last 3 results) No results for input(s): PROBNP in the last 8760 hours. HbA1C: No results for input(s): HGBA1C in the last 72 hours. CBG: No results for input(s): GLUCAP in the last 168 hours. Lipid Profile: No results for input(s): CHOL, HDL, LDLCALC, TRIG, CHOLHDL, LDLDIRECT in the  last 72 hours. Thyroid Function Tests: No results for input(s): TSH, T4TOTAL, FREET4, T3FREE, THYROIDAB in the last 72 hours. Anemia Panel: No results for input(s): VITAMINB12, FOLATE, FERRITIN, TIBC, IRON, RETICCTPCT in the last 72 hours. Sepsis Labs: No results for input(s): PROCALCITON, LATICACIDVEN in the last 168 hours.  Recent Results (from the past 240 hour(s))  Blood culture (routine x 2)     Status: None   Collection Time: 03/03/19 10:20 PM   Specimen: BLOOD LEFT FOREARM  Result Value Ref Range Status   Specimen Description   Final    BLOOD LEFT FOREARM Performed at [facility], [address] [place], [place] [address]    Special Requests   Final    BOTTLES DRAWN AEROBIC AND ANAEROBIC Blood Culture adequate volume Performed at [facility], [address] [place], [place] [address]    Culture   Final    NO GROWTH 5 DAYS Performed at [facility], [place] [address] [place], [place] [address]    Report Status 03/09/2019 FINAL  Final  Blood culture (routine x 2)     Status: None   Collection Time: 03/03/19 10:20 PM   Specimen: BLOOD RIGHT ARM  Result Value Ref Range Status   Specimen Description   Final    BLOOD RIGHT ARM Performed at [facility], [address] [place], [place] [address]    Special Requests   Final    BOTTLES DRAWN AEROBIC AND ANAEROBIC Blood Culture adequate volume Performed at [facility], [address] [place], [place] [address]    Culture   Final    NO GROWTH 5 DAYS Performed at [facility], [place] [address], [place], [place] [address]    Report Status 03/09/2019 FINAL  Final  SARS Coronavirus 2 (Hosp order,Performed in [facility] lab via [employer] ID)     Status: None   Collection Time: 03/03/19 10:24 PM   Specimen: Dry Nasal Swab ([employer] ID Now)  Result Value Ref Range Status   SARS Coronavirus 2 ([employer] Now) NEGATIVE NEGATIVE Final    Comment: (NOTE) Interpretive Result  Comment(s): COVID 19 Positive SARS CoV 2 target nucleic acids are DETECTED. The SARS CoV 2 RNA is generally detectable in upper and lower respiratory specimens during the acute phase of infection.  Positive results are indicative of active infection with SARS CoV 2.  Clinical correlation with patient history and other diagnostic information is necessary to determine patient infection status.  Positive results do not rule out bacterial infection or coinfection with other viruses. The expected result is Negative. COVID 19 Negative SARS CoV 2 target nucleic acids are NOT DETECTED. The SARS CoV 2 RNA is generally detectable in upper and lower respiratory specimens during the acute phase of infection.  Negative results do not preclude SARS CoV 2 infection, do not rule out coinfections with other pathogens, and should not be used as the sole basis for treatment or other patient management decisions.  Negative results must be combined with  clinical  observations, patient history, and epidemiological information. The expected result is Negative. Invalid Presence or absence of SARS CoV 2 nucleic acids cannot be determined. Repeat testing was performed on the submitted specimen and repeated Invalid results were obtained.  If clinically indicated, additional testing on a new specimen with an alternate test methodology [phone]) is advised.  The SARS CoV 2 RNA is generally detectable in upper and lower respiratory specimens during the acute phase of infection. The expected result is Negative. Fact Sheet for Patients:  GolfingFamily.no Fact Sheet for Healthcare Providers: https://www.hernandez-brewer.com/ This test is not yet approved or cleared by the [place] FDA and has been authorized for detection and/or diagnosis of SARS CoV 2 by FDA under an Emergency Use Authorization (EUA).  This EUA will remain in effect (meaning this test can be used) for the  duration of the COVID19 d eclaration under Section 564(b)(1) of the Act, 21 U.S.C. section [phone] 3(b)(1), unless the authorization is terminated or revoked sooner. Performed at [facility], [address], [place], [place] [address]   Urine Culture     Status: None   Collection Time: 03/03/19 11:40 PM   Specimen: Urine, Random  Result Value Ref Range Status   Specimen Description   Final    URINE, RANDOM Performed at [facility], [address] [place], [place] [address]    Special Requests   Final    NONE Performed at [facility], [address] [place], [place] [address]    Culture   Final    NO GROWTH Performed at [facility], [place] [address], [place], [place] [address]    Report Status 03/05/2019 FINAL  Final         Radiology Studies: No results found.      Scheduled Meds: . amLODipine  5 mg Oral Daily  . aspirin  81 mg Oral Daily  . clopidogrel  75 mg Oral Daily  . enoxaparin (LOVENOX) injection  40 mg Subcutaneous Q24H  . memantine  7 mg Oral Daily  . pantoprazole  40 mg Oral Daily  . tuberculin  5 Units Intradermal Once   Continuous Infusions:   LOS: 9 days     Cordelia Poche, MD [facility] 03/13/2019, 2:39 PM  If 7PM-7AM, please contact night-coverage [email or web address]

## 2019-03-14 MED ORDER — LORAZEPAM 2 MG/ML IJ SOLN
1.0000 mg | Freq: Once | INTRAMUSCULAR | Status: AC
  Administered 2019-03-14: 1 mg via INTRAVENOUS
  Filled 2019-03-14: qty 1

## 2019-03-14 NOTE — Progress Notes (Signed)
PROGRESS NOTE    John Blackburn  WNI:[id number] DOB: 06-26-1946 DOA: 03/03/2019 PCP: Hoyt Koch, MD   Brief Narrative: John Blackburn is a 73 y.o. medical history significant ofhypertension, hyperlipidemia, GERD, depression, CAD, stent placement, PE 2014 (not onanticoagulants currently),OSA on CPAP,dCHF. Patient presented with generalized weakness.   Assessment & Plan:   Principal Problem:   Generalized weakness Active Problems:   Hypertension   Hyperlipidemia   Obstructive sleep apnea   Chronic diastolic (congestive) heart failure (HCC)   GERD (gastroesophageal reflux disease)   Memory loss   Hypokalemia   Weakness generalized   CAD (coronary artery disease)   Generalized weakness Initial concern for stroke, with a negative MRI. TSH normal. Patient unsafe for discharge home secondary to inability to perform ADLs and he lives alone -Plan for discharge to ALF at this time  Essential hypertension Well controlled -Continue amlodipine  Obstructive sleep apnea -Continue CPAP qhs  Chronic diastolic heart failure Stable.  GERD -Continue Protonix  Memory loss Patient is on Namenda  Depression/anxiety Continue Lexapro and Wellbutrin XL  DVT prophylaxis: Lovenox Code Status:   Code Status: Full Code Family Communication: None Disposition Plan: unsafe discharge at this time. Unable to discharge to SNF secondary to insurance denial. Plan now for discharge to ALF if able.  Obesity Body mass index is 30.59 kg/m.   Consultants:   None  Procedures:   None  Antimicrobials:  None    Subjective: No concerns this morning.  Objective: Vitals:   03/14/19 0408 03/14/19 0819 03/14/19 1223 03/14/19 1635  BP: (!) 160/95 128/89 100/85 127/81  Pulse: 86 82 88 73  Resp: 17 16 17 16   Temp: (!) 97.3 F (36.3 C) 98.2 F (36.8 C) 98.1 F (36.7 C) 98.1 F (36.7 C)  TempSrc: Oral Oral Oral Axillary  SpO2: 98% 97% 95% 100%  Weight:       Height:        Intake/Output Summary (Last 24 hours) at 03/14/2019 1750 Last data filed at 03/13/2019 1901 Gross per 24 hour  Intake -  Output 850 ml  Net -850 ml   Filed Weights   03/03/19 2154 03/04/19 0657  Weight: 100.7 kg 102.3 kg    Examination:  General exam: Appears calm and comfortable Respiratory system: Clear to auscultation. Respiratory effort normal. Cardiovascular system: S1 & S2 heard, RRR. No murmurs, rubs, gallops or clicks. Gastrointestinal system: Abdomen is nondistended, soft and nontender. No organomegaly or masses felt. Normal bowel sounds heard. Central nervous system: Alert Extremities: No edema. No calf tenderness Skin: No cyanosis. No rashes     Data Reviewed: I have personally reviewed following labs and imaging studies  CBC: Recent Labs  Lab 03/12/19 0519  WBC 10.0  NEUTROABS 7.2  HGB 15.4  HCT 46.1  MCV 99.1  PLT 381   Basic Metabolic Panel: Recent Labs  Lab 03/12/19 0519  NA 138  K 3.8  CL 101  CO2 27  GLUCOSE 128*  BUN 22  CREATININE 0.95  CALCIUM 9.4   GFR: Estimated Creatinine Clearance: 87 mL/min (by C-G formula based on SCr of 0.95 mg/dL). Liver Function Tests: Recent Labs  Lab 03/12/19 0519  AST 24  ALT 26  ALKPHOS 81  BILITOT 0.6  PROT 7.1  ALBUMIN 2.9*   No results for input(s): LIPASE, AMYLASE in the last 168 hours. No results for input(s): AMMONIA in the last 168 hours. Coagulation Profile: No results for input(s): INR, PROTIME in the last 168 hours. Cardiac Enzymes:  No results for input(s): CKTOTAL, CKMB, CKMBINDEX, TROPONINI in the last 168 hours. BNP (last 3 results) No results for input(s): PROBNP in the last 8760 hours. HbA1C: No results for input(s): HGBA1C in the last 72 hours. CBG: No results for input(s): GLUCAP in the last 168 hours. Lipid Profile: No results for input(s): CHOL, HDL, LDLCALC, TRIG, CHOLHDL, LDLDIRECT in the last 72 hours. Thyroid Function Tests: No results for input(s):  TSH, T4TOTAL, FREET4, T3FREE, THYROIDAB in the last 72 hours. Anemia Panel: No results for input(s): VITAMINB12, FOLATE, FERRITIN, TIBC, IRON, RETICCTPCT in the last 72 hours. Sepsis Labs: No results for input(s): PROCALCITON, LATICACIDVEN in the last 168 hours.  Recent Results (from the past 240 hour(s))  Novel Coronavirus, NAA (hospital order; send-out to ref lab)     Status: None   Collection Time: 03/11/19  1:42 PM   Specimen: Nasopharyngeal Swab; Respiratory  Result Value Ref Range Status   SARS-CoV-2, NAA NOT DETECTED NOT DETECTED Final    Comment: (NOTE) This test was developed and its performance characteristics determined by [employer]. This test has not been FDA cleared or approved. This test has been authorized by FDA under an Emergency Use Authorization (EUA). This test is only authorized for the duration of time the declaration that circumstances exist justifying the authorization of the emergency use of in vitro diagnostic tests for detection of SARS-CoV-2 virus and/or diagnosis of COVID-19 infection under section 564(b)(1) of the Act, 21 U.S.C. 409WJX-9(J)(4), unless the authorization is terminated or revoked sooner. When diagnostic testing is negative, the possibility of a false negative result should be considered in the context of a patient's recent exposures and the presence of clinical signs and symptoms consistent with COVID-19. An individual without symptoms of COVID-19 and who is not shedding SARS-CoV-2 virus would expect to have a negative (not detected) result in this assay. Performed  At: [facility] [address] [place], [place] [id number] Rush Farmer MD [id number]    [place]  Final    Comment: Performed at [facility], [place] [address] [place], [place] [address]         Radiology Studies: No results found.      Scheduled Meds: . amLODipine  5 mg Oral Daily  . aspirin  81 mg Oral  Daily  . clopidogrel  75 mg Oral Daily  . enoxaparin (LOVENOX) injection  40 mg Subcutaneous Q24H  . memantine  7 mg Oral Daily  . pantoprazole  40 mg Oral Daily  . tuberculin  5 Units Intradermal Once   Continuous Infusions:   LOS: 10 days     Cordelia Poche, MD [facility] 03/14/2019, 5:50 PM  If 7PM-7AM, please contact night-coverage [email or web address]

## 2019-03-14 NOTE — Progress Notes (Signed)
Occupational Therapy Treatment Patient Details Name: John Blackburn MRN: [id number] DOB: [birth year] Today's Date: 03/14/2019    History of present illness Patient is a 73 year old male. Per notes sons report that over the past 2 weeks the patient has had increasing weakness. He is confused today. 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 PMH: PE, sleep apnea, skin cancer, MI, depression, GERD, HTN, allergies, OA, bilateral knee replacements    OT comments  Pt progressing well. ModA +2 for bed mobility and transfers. Pt ambulating from bed to sink and stood at sink ~2 mins with fair balance. Pt performing grooming at sink with set-upA and minguardA to avoid leaning to R side. Pt continues to be weak on R side and unable to self correct requiring cues for stability. Pt would benefit from continued OT skilled services for ADL, mobility and safety in SNF setting. OT following acutely.    Follow Up Recommendations  SNF;Supervision/Assistance - 24 hour    Equipment Recommendations  Other (comment)    Recommendations for Other Services      Precautions / Restrictions Precautions Precautions: Fall Restrictions Weight Bearing Restrictions: No       Mobility Bed Mobility Overal bed mobility: Needs Assistance Bed Mobility: Supine to Sit     Supine to sit: Mod assist;+2 for physical assistance     General bed mobility comments: Assist for trunk elevation and to guide BLEs  Transfers Overall transfer level: Needs assistance Equipment used: Rolling walker (2 wheeled) Transfers: Sit to/from Stand Sit to Stand: +2 physical assistance;Mod assist;From elevated surface Stand pivot transfers: Mod assist;+2 safety/equipment;From elevated surface       General transfer comment: Two person mod assist from elevated bed to standing.  Verbal cues for safe hand placement.  Multiple sit to stands for peri care, and for rest breaks at sink while preforming ADLs    Balance Overall balance  assessment: Needs assistance Sitting-balance support: Feet supported;Bilateral upper extremity supported Sitting balance-Leahy Scale: Poor Sitting balance - Comments: Cues for midline shift to sitting upright. Pt unable to know to self correct. Postural control: Right lateral lean;Posterior lean Standing balance support: Bilateral upper extremity supported Standing balance-Leahy Scale: Poor Standing balance comment: needs support from RW and therapist.                            ADL either performed or assessed with clinical judgement   ADL Overall ADL's : Needs assistance/impaired     Grooming: Min guard;Wash/dry hands;Oral care;Wash/dry face;Standing Grooming Details (indicate cue type and reason): with chair behind pt in case.                 Toilet Transfer: Moderate assistance;+2 for physical assistance;RW;Stand-pivot   Toileting- Clothing Manipulation and Hygiene: Total assistance;Sitting/lateral lean;Sit to/from stand       Functional mobility during ADLs: Minimal assistance;+2 for physical assistance;Cueing for sequencing;Rolling walker;Cueing for safety General ADL Comments: ASsist for toilet hygiene as pt stood from bed and was having a BM, Pt totalA for clean up.     Vision       Perception     Praxis      Cognition Arousal/Alertness: Awake/alert Behavior During Therapy: WFL for tasks assessed/performed Overall Cognitive Status: Impaired/Different from baseline Area of Impairment: Attention;Memory;Safety/judgement;Following commands;Awareness;Problem solving                   Current Attention Level: Sustained   Following Commands: Follows one step  commands with increased time Safety/Judgement: Decreased awareness of safety;Decreased awareness of deficits Awareness: Emergent Problem Solving: Difficulty sequencing;Decreased initiation;Slow processing;Requires verbal cues;Requires tactile cues General Comments: Pt slow to process  commands, but when given extra time will do so.  Repeated cues for sequencing and safe hand placement during transitions.  Pt reported he was aware he had a BM, but did not initiate calling to let anyone know.         Exercises     Shoulder Instructions       General Comments      Pertinent Vitals/ Pain       Pain Assessment: No/denies pain  Home Living                                          Prior Functioning/Environment              Frequency  Min 2X/week        Progress Toward Goals  OT Goals(current goals can now be found in the care plan section)  Progress towards OT goals: Progressing toward goals  Acute Rehab OT Goals Patient Stated Goal: none stated OT Goal Formulation: Patient unable to participate in goal setting Time For Goal Achievement: 03/18/19 Potential to Achieve Goals: Fair ADL Goals Pt Will Perform Grooming: with set-up;with supervision Pt Will Perform Upper Body Bathing: with set-up;with supervision Pt Will Perform Lower Body Bathing: with min assist Pt Will Perform Toileting - Clothing Manipulation and hygiene: with min guard assist Additional ADL Goal #1: Pt will be min A into and OOB for basic ADLs Additional ADL Goal #2: Pt will be able to sit EOB with S for dynamic tasks  Plan Discharge plan remains appropriate    Co-evaluation    PT/OT/SLP Co-Evaluation/Treatment: Yes Reason for Co-Treatment: Complexity of the patient's impairments (multi-system involvement)   OT goals addressed during session: ADL's and self-care      AM-PAC OT "6 Clicks" Daily Activity     Outcome Measure   Help from another person eating meals?: A Little Help from another person taking care of personal grooming?: A Little Help from another person toileting, which includes using toliet, bedpan, or urinal?: A Lot Help from another person bathing (including washing, rinsing, drying)?: A Lot Help from another person to put on and taking off  regular upper body clothing?: A Little Help from another person to put on and taking off regular lower body clothing?: A Lot 6 Click Score: 15    End of Session Equipment Utilized During Treatment: Gait belt;Rolling walker  OT Visit Diagnosis: Unsteadiness on feet (R26.81);Other abnormalities of gait and mobility (R26.89);Muscle weakness (generalized) (M62.81);Other symptoms and signs involving cognitive function   Activity Tolerance Patient tolerated treatment well   Patient Left in chair;with call bell/phone within reach;with chair alarm set   Nurse Communication Mobility status        Time: 7062-3762 OT Time Calculation (min): 36 min  Charges: OT General Charges $OT Visit: 1 Visit OT Treatments $Self Care/Home Management : 8-22 mins  Darryl Nestle) Marsa Aris OTR/L Acute Rehabilitation Services Pager: [phone] Office: [phone]    John Blackburn 03/14/2019, 4:48 PM

## 2019-03-14 NOTE — Progress Notes (Signed)
Chart reviewed; B Zadkiel Dragan RN,MHA,BSN  Advanced Care Supervisor [phone] 

## 2019-03-14 NOTE — Progress Notes (Signed)
CM called and spoke to [facility] at [facility]. They are not able to take John Blackburn d/t him not able to ambulate independently. They state that their sister facility: John Blackburn is able to take pts requiring a wheelchair. John Blackburn is going to send the patients information to [facility] for review and they should reach out to CM and the patients son.  CM called [name] (son) and updated him on the above. He is in agreement with [facility].

## 2019-03-14 NOTE — Progress Notes (Signed)
RT offered to place pt on CPAP dream station for the night and pt declined. Pt respiratory status stable at this time on RA. RT will continue to monitor.

## 2019-03-15 ENCOUNTER — Inpatient Hospital Stay (HOSPITAL_COMMUNITY): Payer: PPO

## 2019-03-15 LAB — PROCALCITONIN: Procalcitonin: 0.48 ng/mL

## 2019-03-15 LAB — CBC
HCT: 45.1 % (ref 39.0–52.0)
Hemoglobin: 15.3 g/dL (ref 13.0–17.0)
MCH: 33.3 pg (ref 26.0–34.0)
MCHC: 33.9 g/dL (ref 30.0–36.0)
MCV: 98 fL (ref 80.0–100.0)
Platelets: 318 10*3/uL (ref 150–400)
RBC: 4.6 MIL/uL (ref 4.22–5.81)
RDW: 13.2 % (ref 11.5–15.5)
WBC: 32.1 10*3/uL — ABNORMAL HIGH (ref 4.0–10.5)
nRBC: 0 % (ref 0.0–0.2)

## 2019-03-15 IMAGING — DX PORTABLE CHEST - 1 VIEW
1 series · 1 of 1 positions shown · non-contrast
Comparison: [DATE]

CLINICAL DATA: Fever and cough.

EXAM:
PORTABLE CHEST 1 VIEW

[chest]
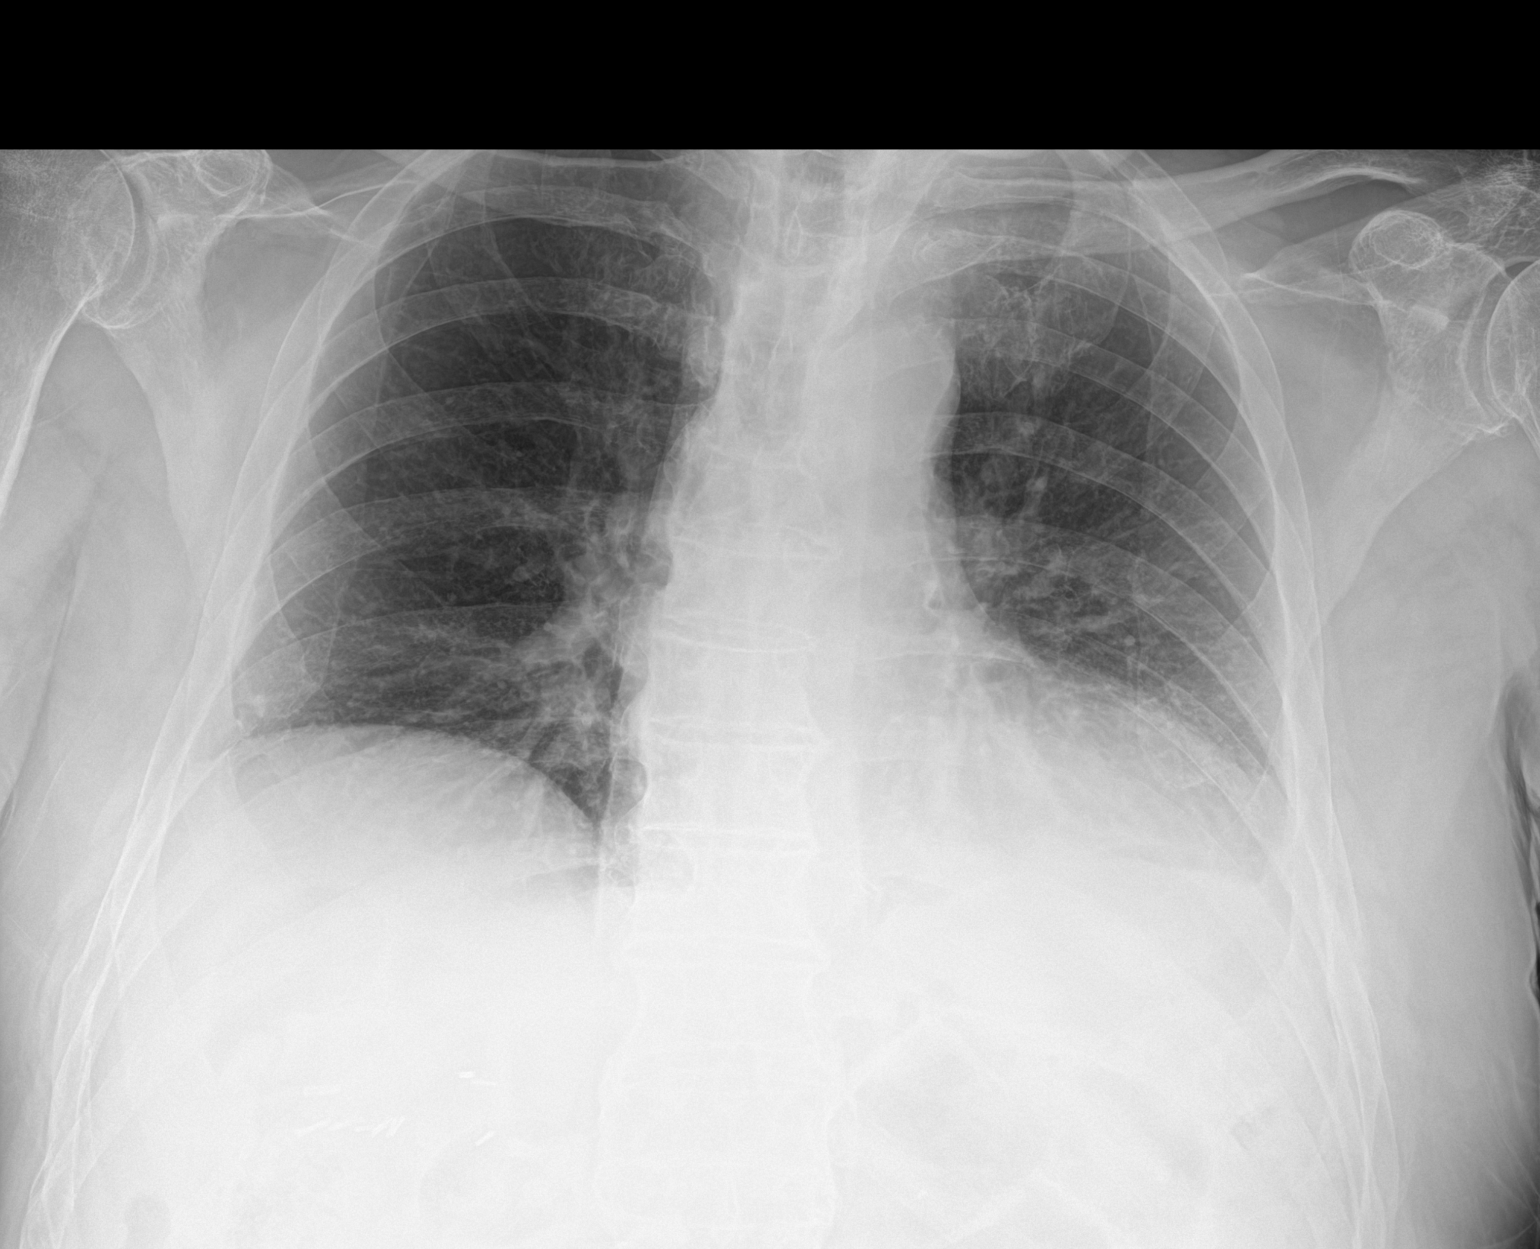

[1 of 1 positions shown; findings below may reference images not displayed]

FINDINGS: There is either pleural thickening or a tiny effusion on the left.
There appears to be increased density in the left retrocardiac
region. No other interval changes.
IMPRESSION: Suspected increasing opacity in the left retrocardiac region may
represent atelectasis or infiltrate. Recommend attention on
follow-up.

Tiny left pleural effusion versus pleural thickening.

## 2019-03-15 MED ORDER — SODIUM CHLORIDE 0.9 % IV SOLN
3.0000 g | Freq: Four times a day (QID) | INTRAVENOUS | Status: DC
  Administered 2019-03-15 – 2019-03-20 (×19): 3 g via INTRAVENOUS
  Filled 2019-03-15: qty 3
  Filled 2019-03-15: qty 8
  Filled 2019-03-15: qty 3
  Filled 2019-03-15 (×2): qty 8
  Filled 2019-03-15: qty 3
  Filled 2019-03-15: qty 8
  Filled 2019-03-15: qty 3
  Filled 2019-03-15 (×3): qty 8
  Filled 2019-03-15: qty 3
  Filled 2019-03-15 (×2): qty 8
  Filled 2019-03-15: qty 3
  Filled 2019-03-15 (×2): qty 8
  Filled 2019-03-15 (×4): qty 3

## 2019-03-15 NOTE — Care Management Important Message (Signed)
Important Message  Patient Details  Name: John Blackburn MRN: [id number] Date of Birth: 04-Jun-1946   Medicare Important Message Given:  Yes     Orbie Pyo 03/15/2019, 3:38 PM

## 2019-03-15 NOTE — Progress Notes (Signed)
Physical Therapy Treatment Patient Details Name: John Blackburn MRN: [id number] DOB: 1946-04-28 Today's Date: 03/15/2019    History of Present Illness Patient is a 73 year old male. Per notes sons report that over the past 2 weeks the patient has had increasing weakness. He is confused today. 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 PMH: PE, sleep apnea, skin cancer, MI, depression, GERD, HTN, allergies, OA, bilateral knee replacements     PT Comments    Patient seen for mobility progression. This session focused on transfer training and sitting balance EOB. Pt requires increased assist for mobility this session. Continue to progress as tolerated.    Follow Up Recommendations  SNF     Equipment Recommendations  None recommended by PT    Recommendations for Other Services       Precautions / Restrictions Precautions Precautions: Fall Restrictions Weight Bearing Restrictions: No    Mobility  Bed Mobility Overal bed mobility: Needs Assistance Bed Mobility: Supine to Sit;Sit to Supine     Supine to sit: +2 for physical assistance;Max assist;HOB elevated Sit to supine: Max assist;+2 for physical assistance   General bed mobility comments: assist to bring bilat LE and hips to EOB and to elevate trunk into sitting; cues for sequencing   Transfers Overall transfer level: Needs assistance Equipment used: Rolling walker (2 wheeled) Transfers: Sit to/from Stand Sit to Stand: +2 physical assistance;From elevated surface;Max assist         General transfer comment: attempted sit to stand from EOB intially with RW and then using back of recliner however pt unable to acheive standing this session   Ambulation/Gait                 Stairs             Wheelchair Mobility    Modified Rankin (Stroke Patients Only) Modified Rankin (Stroke Patients Only) Pre-Morbid Rankin Score: Slight disability Modified Rankin: Moderately severe disability     Balance  Overall balance assessment: Needs assistance Sitting-balance support: Feet supported;Bilateral upper extremity supported Sitting balance-Leahy Scale: Poor Sitting balance - Comments: multimodal cues and assistance required to correct posture to midline  Postural control: Posterior lean;Left lateral lean                                  Cognition Arousal/Alertness: Awake/alert Behavior During Therapy: WFL for tasks assessed/performed Overall Cognitive Status: Impaired/Different from baseline Area of Impairment: Attention;Memory;Safety/judgement;Following commands;Awareness;Problem solving                   Current Attention Level: Sustained Memory: Decreased short-term memory Following Commands: Follows one step commands with increased time;Follows one step commands inconsistently Safety/Judgement: Decreased awareness of safety;Decreased awareness of deficits   Problem Solving: Difficulty sequencing;Decreased initiation;Slow processing;Requires verbal cues;Requires tactile cues General Comments: pt is easily distracted while in sitting and when attempting to stand took hand from RW/back of recliner to adjust gown, lines, etc      Exercises      General Comments        Pertinent Vitals/Pain Pain Assessment: No/denies pain    Home Living                      Prior Function            PT Goals (current goals can now be found in the care plan section) Acute Rehab PT Goals Patient Stated  Goal: none stated Progress towards PT goals: Progressing toward goals    Frequency    Min 4X/week      PT Plan Current plan remains appropriate    Co-evaluation              AM-PAC PT "6 Clicks" Mobility   Outcome Measure  Help needed turning from your back to your side while in a flat bed without using bedrails?: A Lot Help needed moving from lying on your back to sitting on the side of a flat bed without using bedrails?: A Lot Help needed  moving to and from a bed to a chair (including a wheelchair)?: A Lot Help needed standing up from a chair using your arms (e.g., wheelchair or bedside chair)?: A Lot Help needed to walk in hospital room?: A Lot Help needed climbing 3-5 steps with a railing? : Total 6 Click Score: 11    End of Session Equipment Utilized During Treatment: Gait belt Activity Tolerance: Patient limited by fatigue Patient left: with call bell/phone within reach;in bed;with bed alarm set Nurse Communication: Mobility status PT Visit Diagnosis: Other abnormalities of gait and mobility (R26.89);Muscle weakness (generalized) (M62.81);Difficulty in walking, not elsewhere classified (R26.2)     Time: 6803-2122 PT Time Calculation (min) (ACUTE ONLY): 19 min  Charges:  $Therapeutic Activity: 23-37 mins                     Earney Navy, PTA Acute Rehabilitation Services Pager: [phone] Office: [phone]     Darliss Cheney 03/15/2019, 4:15 PM

## 2019-03-15 NOTE — Progress Notes (Signed)
03/15/2019 Late entry note from 03/14/19.  Pt is progressing well with short distance gait and mobility, still requiring two person assist for safety. He remains appropriate for post acute therapy at discharge.  SW and RN CM are working on dispo as he has been denied SNF by his insurance.  Barbarann Ehlers Vi Biddinger, PT, DPT  Acute Rehabilitation [phone] pager [phone] office  @ [facility]: [phone]        03/14/19 1157  PT Visit Information  Last PT Received On 03/14/19  Assistance Needed +2 (for safety with transfers and gait progression)  PT/OT/SLP Co-Evaluation/Treatment Yes  Reason for Co-Treatment Complexity of the patient's impairments (multi-system involvement);Necessary to address cognition/behavior during functional activity;To address functional/ADL transfers;For patient/therapist safety  PT goals addressed during session Mobility/safety with mobility;Balance;Proper use of DME  History of Present Illness Patient is a 73 year old male. Per notes sons report that over the past 2 weeks the patient has had increasing weakness. He is confused today. 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 PMH: PE, sleep apnea, skin cancer, MI, depression, GERD, HTN, allergies, OA, bilateral knee replacements   Subjective Data  Patient Stated Goal none stated  Precautions  Precautions Fall  Pain Assessment  Pain Assessment No/denies pain  Cognition  Arousal/Alertness Awake/alert  Behavior During Therapy WFL for tasks assessed/performed  Overall Cognitive Status Impaired/Different from baseline  Area of Impairment Attention;Memory;Safety/judgement;Following commands;Awareness;Problem solving  Current Attention Level Sustained (easily distracted by hall and TV)  Memory  (pt did remember that I went to [facility] from 2 days ago)  Following Commands Follows one step commands with increased time  Safety/Judgement Decreased awareness of safety;Decreased awareness of deficits   Awareness Emergent  Problem Solving Difficulty sequencing;Decreased initiation;Slow processing;Requires verbal cues;Requires tactile cues  General Comments Pt slow to process commands, but when given extra time will do so.  Repeated cues for sequencing and safe hand placement during transitions.  Pt reported he was aware he had a BM, but did not initiate calling to let anyone know.   Bed Mobility  Overal bed mobility Needs Assistance  Bed Mobility Supine to Sit  Supine to sit Mod assist;+2 for physical assistance  General bed mobility comments Mod assist to support trunk during transition up, slow initiation to command, but if given extra time will complete.  Most assist needed at trunk to come up and at hips to scoot out to EOB.  Posterior/R preference throughout.   Transfers  Overall transfer level Needs assistance  Equipment used Rolling walker (2 wheeled)  Transfers Sit to/from Stand  Sit to Stand +2 physical assistance;Mod assist;From elevated surface  General transfer comment Two person mod assist from elevated bed to standing.  Verbal cues for safe hand placement.  Multiple sit to stands for peri care, and for rest breaks at sink while preforming ADLs  Ambulation/Gait  Ambulation/Gait assistance Mod assist;+2 physical assistance;+2 safety/equipment  Gait Distance (Feet) 8 Feet  Assistive device Rolling walker (2 wheeled)  Gait Pattern/deviations Step-through pattern;Trunk flexed;Drifts right/left;Narrow base of support;Shuffle  General Gait Details Pt with narrow base, short choppy steps and progressively flexing trunk and knees.  Cues for upright posutre, tuck his buttocks and larger steps.  Fatigues quickly, second person physical assist and for safety to pull chair up behind.   Modified Rankin (Stroke Patients Only)  Pre-Morbid Rankin Score 2  Modified Rankin 4  Balance  Overall balance assessment Needs assistance  Sitting-balance support Feet supported;Bilateral upper extremity  supported  Sitting balance-Leahy  Scale Poor  Sitting balance - Comments Pt can be close supervision with cues to come back to midline for 1-2 mins, however, he does better with min tactile assist with cues for L anterior correction of R posterior lean  Postural control Right lateral lean;Posterior lean  Standing balance support Bilateral upper extremity supported  Standing balance-Leahy Scale Poor  Standing balance comment needs support from RW and therapist.   PT - End of Session  Equipment Utilized During Treatment Gait belt  Activity Tolerance Patient limited by fatigue  Patient left in chair;with call bell/phone within reach;with chair alarm set   PT - Assessment/Plan  PT Plan Current plan remains appropriate  PT Visit Diagnosis Other abnormalities of gait and mobility (R26.89);Muscle weakness (generalized) (M62.81);Difficulty in walking, not elsewhere classified (R26.2)  PT Frequency (ACUTE ONLY) Min 4X/week  Follow Up Recommendations SNF  PT equipment None recommended by PT  AM-PAC PT "6 Clicks" Mobility Outcome Measure (Version 2)  Help needed turning from your back to your side while in a flat bed without using bedrails? 2  Help needed moving from lying on your back to sitting on the side of a flat bed without using bedrails? 2  Help needed moving to and from a bed to a chair (including a wheelchair)? 2  Help needed standing up from a chair using your arms (e.g., wheelchair or bedside chair)? 2  Help needed to walk in hospital room? 2  Help needed climbing 3-5 steps with a railing?  1  6 Click Score 11  Consider Recommendation of Discharge To: CIR/SNF/LTACH  PT Goal Progression  Progress towards PT goals Progressing toward goals  PT Time Calculation  PT Start Time (ACUTE ONLY) 1110  PT Stop Time (ACUTE ONLY) 1143  PT Time Calculation (min) (ACUTE ONLY) 33 min  PT General Charges  $$ ACUTE PT VISIT 1 Visit  PT Treatments  $Therapeutic Activity 8-22 mins

## 2019-03-15 NOTE — Evaluation (Signed)
Clinical/Bedside Swallow Evaluation Patient Details  Name: John Blackburn MRN: [id number] Date of Birth: 07/12/1946  Today's Date: 03/15/2019 Time: SLP Start Time (ACUTE ONLY): 1520 SLP Stop Time (ACUTE ONLY): 1545 SLP Time Calculation (min) (ACUTE ONLY): 25 min  Past Medical History:  Past Medical History:  Diagnosis Date  . Allergy   . 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 CAD (coronary artery disease)    08/2017 PCI/DES mRCA, RPDA, CTO pf pLCX with collaterals, normal EF  . Depression    on multiple meds. Has been seen at [facility]. and Dr. Sabra Heck is his prescriber  . Diverticulitis of colon 2000 's   treated as an outpatient  . GERD (gastroesophageal reflux disease)    UGI done August '12 - ulcer/. Dr. Ferdinand Lango, gastroenterologist in [facility].   [name] Hx of adenomatous colonic polyps   . Hyperlipidemia    last lipid panel: HDL 44, LDL 117  . Hypertension   . Myocardial infarction ([place])   . Peptic ulcer    in the past and just recently-August '12  . Pulmonary emboli [facility])    noted October 2014 - treated by Dr. Linda Hedges  . skin cancer    skin CA  . Sleep apnea, primary central    wears CPAP   Past Surgical History:  Past Surgical History:  Procedure Laterality Date  . ANAL RECTAL MANOMETRY N/A 11/30/2016   Procedure: ANO RECTAL MANOMETRY;  Surgeon: Mauri Pole, MD;  Location: [facility];  Service: Endoscopy;  Laterality: N/A;  . CARDIAC CATHETERIZATION    . CHOLECYSTECTOMY  1990   laproscopic   . CORONARY STENT INTERVENTION N/A 09/01/2017   Procedure: CORONARY STENT INTERVENTION;  Surgeon: Jettie Booze, MD;  Location: [place] [facility];  Service: Cardiovascular;  Laterality: N/A;  . HERNIA REPAIR    . JOINT REPLACEMENT     knee  . PROSTATE SURGERY     needle biopsy's, TURP  . RIGHT/LEFT HEART CATH AND CORONARY ANGIOGRAPHY N/A 09/01/2017   Procedure: RIGHT/LEFT HEART CATH AND CORONARY ANGIOGRAPHY;  Surgeon: Jettie Booze,  MD;  Location: [place] [facility];  Service: Cardiovascular;  Laterality: N/A;  . TKR bilateral  2006   Dr. Violet Baldy  . UPPER GASTROINTESTINAL ENDOSCOPY    . VASECTOMY     HPI:  Patient is a 73 year old male. Per notes sons report that over the past 2 weeks the patient has had increasing weakness. He is confused today. 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 PMH: PE, sleep apnea, skin cancer, MI, depression, GERD, HTN, allergies, OA, bilateral knee replacements   Assessment / Plan / Recommendation Clinical Impression  Patient exhibits a moderate oral and a min-moderate pharyngeal dysphagia with decreased mastication of regular and Dys 3 solids, suspected delayed swallow initiation, immediate coughing with thin liquids. His teeth are all in very poor condition, he is weak, voice is weak and hoarse, he is not able to effectively manage solids other than purees at this time. SLP Visit Diagnosis: Dysphagia, oropharyngeal phase (R13.12)    Aspiration Risk       Diet Recommendation Nectar-thick liquid;Dysphagia 1 (Puree)   Liquid Administration via: Cup;Straw Medication Administration: Crushed with puree Supervision: Staff to assist with self feeding;Full supervision/cueing for compensatory strategies Compensations: Minimize environmental distractions;Slow rate;Small sips/bites;Lingual sweep for clearance of pocketing Postural Changes: Seated upright at 90 degrees    Other  Recommendations Oral Care Recommendations: Oral care BID   Follow up  Recommendations Home health SLP;Skilled Nursing facility;24 hour supervision/assistance      Frequency and Duration min 2x/week  1 week       Prognosis Prognosis for Safe Diet Advancement: Fair Barriers to Reach Goals: Time post onset;Severity of deficits      Swallow Study   General Date of Onset: 03/15/19 HPI: Patient is a 73 year old male. Per notes sons report that over the past 2 weeks the patient has had increasing weakness. He is  confused today. 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 PMH: PE, sleep apnea, skin cancer, MI, depression, GERD, HTN, allergies, OA, bilateral knee replacements Type of Study: Bedside Swallow Evaluation Previous Swallow Assessment: N/A Diet Prior to this Study: Regular;Thin liquids Temperature Spikes Noted: No Respiratory Status: Room air History of Recent Intubation: No Behavior/Cognition: Alert;Cooperative;Requires cueing;Lethargic/Drowsy Oral Cavity Assessment: Within Functional Limits Oral Care Completed by SLP: Yes Oral Cavity - Dentition: Poor condition Vision: Functional for self-feeding Self-Feeding Abilities: Needs assist;Needs set up Patient Positioning: Upright in bed Baseline Vocal Quality: Hoarse;Low vocal intensity Volitional Cough: Weak Volitional Swallow: Unable to elicit    Oral/Motor/Sensory Function Overall Oral Motor/Sensory Function: Within functional limits   Ice Chips     Thin Liquid Thin Liquid: Impaired Presentation: Cup Pharyngeal  Phase Impairments: Cough - Immediate    Nectar Thick Nectar Thick Liquid: Impaired Pharyngeal Phase Impairments: Suspected delayed Swallow Other Comments: No overt s/s of aspiraiton or penetration   Honey Thick     Puree Puree: Within functional limits   Solid     Solid: Impaired Oral Phase Impairments: Impaired mastication Oral Phase Functional Implications: Prolonged oral transit;Oral residue Other Comments: Patient was chewing on food from lunch (time of session was 3:20PM. He had mashed up peas in mouth and SLP saw partially masticated brocolli on table and in napkin.      Nadara Mode Tarrell 03/15/2019,5:14 PM   Sonia Baller, MA, CCC-SLP Speech Therapy [facility] Acute Rehab Pager: [phone]

## 2019-03-15 NOTE — Progress Notes (Signed)
[facility] is interested in the patient but wont have paperwork and things together until next week. Pt is very weak and CM is not sure he will be able to go to an ALF. CM spoke to pts son and he is agreeable to LT SNF fax out also.  CM spoke to : [facility], [facility], [facility], [facility], [facility] and none of these facilities have LT beds currently.  [facility] isnt taking admissions. [facility] is going to look at the referral.  Awaiting to hear from [facility] and [facility].

## 2019-03-15 NOTE — Progress Notes (Signed)
TB test: Negative

## 2019-03-15 NOTE — Progress Notes (Signed)
PROGRESS NOTE    John Blackburn  PIR:[id number] DOB: June 08, 1946 DOA: 03/03/2019 PCP: Hoyt Koch, MD   Brief Narrative: John Blackburn is a 73 y.o. medical history significant ofhypertension, hyperlipidemia, GERD, depression, CAD, stent placement, PE 2014 (not onanticoagulants currently),OSA on CPAP,dCHF. Patient presented with generalized weakness.   Assessment & Plan:   Principal Problem:   Generalized weakness Active Problems:   Hypertension   Hyperlipidemia   Obstructive sleep apnea   Chronic diastolic (congestive) heart failure (HCC)   GERD (gastroesophageal reflux disease)   Memory loss   Hypokalemia   Weakness generalized   CAD (coronary artery disease)   Generalized weakness Initial concern for stroke, with a negative MRI. TSH normal. Patient unsafe for discharge home secondary to inability to perform ADLs and he lives alone -Plan for discharge to ALF at this time  Fever Unknown etiology. No symptoms -CBC, monitor fever curve  Essential hypertension Well controlled -Continue amlodipine  Obstructive sleep apnea -Continue CPAP qhs (patient continues to decline. Have spoken to him about this)  Chronic diastolic heart failure Stable.  GERD -Continue Protonix  Memory loss Patient is on Namenda  Depression/anxiety Continue Lexapro and Wellbutrin XL  DVT prophylaxis: Lovenox Code Status:   Code Status: Full Code Family Communication: None Disposition Plan: unsafe discharge at this time. Unable to discharge to SNF secondary to insurance denial. Plan now for discharge to ALF if able.  Obesity Body mass index is 30.59 kg/m.   Consultants:   None  Procedures:   None  Antimicrobials:  None    Subjective: No issues overnight or this morning.  Objective: Vitals:   03/14/19 1931 03/14/19 2310 03/15/19 0348 03/15/19 0750  BP: 136/77 (!) 173/77 131/81 124/76  Pulse: 75 92 (!) 121 (!) 115  Resp: 16 16 16 16   Temp: 98 F  (36.7 C) 99.8 F (37.7 C) 99.8 F (37.7 C) (!) 101 F (38.3 C)  TempSrc: Oral Oral Oral Oral  SpO2: 98% 94% 94% 93%  Weight:      Height:        Intake/Output Summary (Last 24 hours) at 03/15/2019 1101 Last data filed at 03/15/2019 0349 Gross per 24 hour  Intake -  Output 350 ml  Net -350 ml   Filed Weights   03/03/19 2154 03/04/19 0657  Weight: 100.7 kg 102.3 kg    Examination:  General exam: Appears calm and comfortable Respiratory system: Clear to auscultation. Respiratory effort normal. Cardiovascular system: S1 & S2 heard, RRR. No murmurs, rubs, gallops or clicks. Gastrointestinal system: Abdomen is nondistended, soft and nontender. No organomegaly or masses felt. Normal bowel sounds heard. Central nervous system: Alert Extremities: No edema. No calf tenderness Skin: No cyanosis. No rashes Psychiatry: Judgement and insight appear normal. Blunt affect.     Data Reviewed: I have personally reviewed following labs and imaging studies  CBC: Recent Labs  Lab 03/12/19 0519  WBC 10.0  NEUTROABS 7.2  HGB 15.4  HCT 46.1  MCV 99.1  PLT 630   Basic Metabolic Panel: Recent Labs  Lab 03/12/19 0519  NA 138  K 3.8  CL 101  CO2 27  GLUCOSE 128*  BUN 22  CREATININE 0.95  CALCIUM 9.4   GFR: Estimated Creatinine Clearance: 87 mL/min (by C-G formula based on SCr of 0.95 mg/dL). Liver Function Tests: Recent Labs  Lab 03/12/19 0519  AST 24  ALT 26  ALKPHOS 81  BILITOT 0.6  PROT 7.1  ALBUMIN 2.9*   No results for input(s):  LIPASE, AMYLASE in the last 168 hours. No results for input(s): AMMONIA in the last 168 hours. Coagulation Profile: No results for input(s): INR, PROTIME in the last 168 hours. Cardiac Enzymes: No results for input(s): CKTOTAL, CKMB, CKMBINDEX, TROPONINI in the last 168 hours. BNP (last 3 results) No results for input(s): PROBNP in the last 8760 hours. HbA1C: No results for input(s): HGBA1C in the last 72 hours. CBG: No results for  input(s): GLUCAP in the last 168 hours. Lipid Profile: No results for input(s): CHOL, HDL, LDLCALC, TRIG, CHOLHDL, LDLDIRECT in the last 72 hours. Thyroid Function Tests: No results for input(s): TSH, T4TOTAL, FREET4, T3FREE, THYROIDAB in the last 72 hours. Anemia Panel: No results for input(s): VITAMINB12, FOLATE, FERRITIN, TIBC, IRON, RETICCTPCT in the last 72 hours. Sepsis Labs: No results for input(s): PROCALCITON, LATICACIDVEN in the last 168 hours.  Recent Results (from the past 240 hour(s))  Novel Coronavirus, NAA (hospital order; send-out to ref lab)     Status: None   Collection Time: 03/11/19  1:42 PM   Specimen: Nasopharyngeal Swab; Respiratory  Result Value Ref Range Status   SARS-CoV-2, NAA NOT DETECTED NOT DETECTED Final    Comment: (NOTE) This test was developed and its performance characteristics determined by [employer]. This test has not been FDA cleared or approved. This test has been authorized by FDA under an Emergency Use Authorization (EUA). This test is only authorized for the duration of time the declaration that circumstances exist justifying the authorization of the emergency use of in vitro diagnostic tests for detection of SARS-CoV-2 virus and/or diagnosis of COVID-19 infection under section 564(b)(1) of the Act, 21 U.S.C. 932IZT-2(W)(5), unless the authorization is terminated or revoked sooner. When diagnostic testing is negative, the possibility of a false negative result should be considered in the context of a patient's recent exposures and the presence of clinical signs and symptoms consistent with COVID-19. An individual without symptoms of COVID-19 and who is not shedding SARS-CoV-2 virus would expect to have a negative (not detected) result in this assay. Performed  At: [facility] [address] [place], [place] [id number] Rush Farmer MD NK:[id number]    [place]  Final    Comment: Performed at  [facility], [place] [address], [place], [place] [address]         Radiology Studies: No results found.      Scheduled Meds: . amLODipine  5 mg Oral Daily  . aspirin  81 mg Oral Daily  . clopidogrel  75 mg Oral Daily  . enoxaparin (LOVENOX) injection  40 mg Subcutaneous Q24H  . memantine  7 mg Oral Daily  . pantoprazole  40 mg Oral Daily  . tuberculin  5 Units Intradermal Once   Continuous Infusions:   LOS: 11 days     Cordelia Poche, MD [facility] 03/15/2019, 11:01 AM  If 7PM-7AM, please contact night-coverage [email or web address]

## 2019-03-15 NOTE — Progress Notes (Signed)
Pharmacy Antibiotic Note  John Blackburn is a 73 y.o. male admitted on 03/03/2019 with generalized weakness.  Now with fever and leukocytosis, so Pharmacy has been consulted for Unasyn dosing for aspiration PNA.  SCr 0.95, CrCL 87 ml/min, Tmax 101, WBC 32.1, PCT 0.48.  Plan: Unasyn 3gm IV Q6H Monitor renal fxn, clinical progress  Height: 6' (182.9 cm) Weight: 225 lb 8.5 oz (102.3 kg) IBW/kg (Calculated) : 77.6  Temp (24hrs), Avg:99.6 F (37.6 C), Min:98 F (36.7 C), Max:101 F (38.3 C)  Recent Labs  Lab 03/12/19 0519 03/15/19 1203  WBC 10.0 32.1*  CREATININE 0.95  --     Estimated Creatinine Clearance: 87 mL/min (by C-G formula based on SCr of 0.95 mg/dL).    Allergies  Allergen Reactions  . Cephalexin Rash  . Levofloxacin Rash    Unasyn 7/10 >>  6/28 covid - negative 6/28 UCx - negative 6/28 BCx -  negative 7/6 covid - negative 7/10 UCx - 7/10 BCx -   [name] D. Mina Marble, PharmD, BCPS, [place] 03/15/2019, 7:14 PM

## 2019-03-16 LAB — BASIC METABOLIC PANEL
Anion gap: 12 (ref 5–15)
BUN: 28 mg/dL — ABNORMAL HIGH (ref 8–23)
CO2: 26 mmol/L (ref 22–32)
Calcium: 9.1 mg/dL (ref 8.9–10.3)
Chloride: 101 mmol/L (ref 98–111)
Creatinine, Ser: 1.32 mg/dL — ABNORMAL HIGH (ref 0.61–1.24)
GFR calc Af Amer: >60 mL/min (ref >60)
GFR calc non Af Amer: 54 mL/min — ABNORMAL LOW (ref >60)
Glucose, Bld: 123 mg/dL — ABNORMAL HIGH (ref 70–99)
Potassium: 4 mmol/L (ref 3.5–5.1)
Sodium: 139 mmol/L (ref 135–145)

## 2019-03-16 LAB — CBC
HCT: 43 % (ref 39.0–52.0)
Hemoglobin: 14.5 g/dL (ref 13.0–17.0)
MCH: 33.2 pg (ref 26.0–34.0)
MCHC: 33.7 g/dL (ref 30.0–36.0)
MCV: 98.4 fL (ref 80.0–100.0)
Platelets: 284 10*3/uL (ref 150–400)
RBC: 4.37 MIL/uL (ref 4.22–5.81)
RDW: 13.3 % (ref 11.5–15.5)
WBC: 22.3 10*3/uL — ABNORMAL HIGH (ref 4.0–10.5)
nRBC: 0 % (ref 0.0–0.2)

## 2019-03-16 LAB — PROCALCITONIN: Procalcitonin: 0.73 ng/mL

## 2019-03-16 MED ORDER — SODIUM CHLORIDE 0.9 % IV SOLN
INTRAVENOUS | Status: AC
  Administered 2019-03-16 – 2019-03-17 (×2): via INTRAVENOUS

## 2019-03-16 NOTE — Progress Notes (Addendum)
  Speech Language Pathology Treatment: Dysphagia  Patient Details Name: John Blackburn MRN: [id number] DOB: 09/20/1945 Today's Date: 03/16/2019 Time: 4818-5909 SLP Time Calculation (min) (ACUTE ONLY): 19 min  Assessment / Plan / Recommendation Clinical Impression  Skilled observation with intake of cup sips/tsp amount of nectar-thickened liquids/puree consistencies with mod verbal/visual cues provided for volume control; pt with one incident of immediate throat clearing with initial nectar-thick consistency, but with cueing/pacing, pt able to eliminate for rest of trial/swallow tx; minimally hoarse vocal quality noted during tx, but nursing stated pt "had no difficulty" with breakfast meal and ate 100%; ST will continue efforts in acute setting for diet tolerance/education re: swallowing precautions during meals vs objective testing prior to d/c as pt may be moving into memory care facility per chart review.  Suspect cognitive-based dysphagia d/t hx of prior CVAs and cognitive decline; continue Dysphagia 1 (puree)/nectar-thickened liquids for safety purposes.  HPI HPI: Patient is a 73 year old male. Per notes sons report that over the past 2 weeks the patient has had increasing weakness. He is confused today. 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 PMH: PE, sleep apnea, skin cancer, MI, depression, GERD, HTN, allergies, OA, bilateral knee replacements      SLP Plan  Continue with current plan of care       Recommendations  Diet recommendations: Dysphagia 1 (puree);Nectar-thick liquid Liquids provided via: Cup;No straw Medication Administration: Crushed with puree Supervision: Patient able to self feed;Staff to assist with self feeding;Intermittent supervision to cue for compensatory strategies Compensations: Minimize environmental distractions;Slow rate;Small sips/bites;Lingual sweep for clearance of pocketing                Oral Care Recommendations: Oral care BID Follow up  Recommendations: Home health SLP;Skilled Nursing facility;24 hour supervision/assistance SLP Visit Diagnosis: Dysphagia, oropharyngeal phase (R13.12) Plan: Continue with current plan of care                      Elvina Sidle, M.S., CCC-SLP 03/16/2019, 12:24 PM

## 2019-03-16 NOTE — Plan of Care (Signed)
  Problem: Education: Goal: Knowledge of General Education information will improve Description: Including pain rating scale, medication(s)/side effects and non-pharmacologic comfort measures Outcome: Progressing   Problem: Health Behavior/Discharge Planning: Goal: Ability to manage health-related needs will improve Outcome: Progressing   Problem: Clinical Measurements: Goal: Ability to maintain clinical measurements within normal limits will improve Outcome: Progressing Goal: Will remain free from infection Outcome: Progressing Goal: Respiratory complications will improve Outcome: Progressing Goal: Cardiovascular complication will be avoided Outcome: Progressing   Problem: Activity: Goal: Risk for activity intolerance will decrease Outcome: Progressing   Problem: Nutrition: Goal: Adequate nutrition will be maintained Outcome: Progressing   Problem: Safety: Goal: Ability to remain free from injury will improve Outcome: Progressing   Problem: Skin Integrity: Goal: Risk for impaired skin integrity will decrease Outcome: Progressing   Problem: Education: Goal: Knowledge of disease or condition will improve Outcome: Progressing Goal: Knowledge of secondary prevention will improve Outcome: Progressing Goal: Knowledge of patient specific risk factors addressed and post discharge goals established will improve Outcome: Progressing Goal: Individualized Educational Video(s) Outcome: Progressing   Problem: Coping: Goal: Will verbalize positive feelings about self Outcome: Progressing Goal: Will identify appropriate support needs Outcome: Progressing   Problem: Health Behavior/Discharge Planning: Goal: Ability to manage health-related needs will improve Outcome: Progressing   Problem: Self-Care: Goal: Ability to participate in self-care as condition permits will improve Outcome: Progressing Goal: Verbalization of feelings and concerns over difficulty with self-care will  improve Outcome: Progressing Goal: Ability to communicate needs accurately will improve Outcome: Progressing   Problem: Nutrition: Goal: Risk of aspiration will decrease Outcome: Progressing Goal: Dietary intake will improve Outcome: Progressing   Problem: Ischemic Stroke/TIA Tissue Perfusion: Goal: Complications of ischemic stroke/TIA will be minimized Outcome: Progressing

## 2019-03-16 NOTE — Progress Notes (Signed)
PROGRESS NOTE    John Blackburn  HCW:[id number] DOB: 11-05-1945 DOA: 03/03/2019 PCP: Hoyt Koch, MD   Brief Narrative: John Blackburn is a 73 y.o. medical history significant ofhypertension, hyperlipidemia, GERD, depression, CAD, stent placement, PE 2014 (not onanticoagulants currently),OSA on CPAP,dCHF. Patient presented with generalized weakness.   Assessment & Plan:   Principal Problem:   Generalized weakness Active Problems:   Hypertension   Hyperlipidemia   Obstructive sleep apnea   Chronic diastolic (congestive) heart failure (HCC)   GERD (gastroesophageal reflux disease)   Memory loss   Hypokalemia   Weakness generalized   CAD (coronary artery disease)   Generalized weakness Initial concern for stroke, with a negative MRI. TSH normal. Patient unsafe for discharge home secondary to inability to perform ADLs and he lives alone -Plan for discharge to ALF at this time  Fever Possible aspiration pneumonia. Significantly elevated WBC. Procalcitonin elevated. -Unasyn -Blood cultures/urine culture pending  AKI Unsure of etiology. Mild. Possibly secondary to  -IV fluids -Repeat BMP in AM  Essential hypertension Well controlled -Continue amlodipine  Dysphagia -Dysphagia 1 diet (puree), nectar thickened liquid per SLP recommendations  Obstructive sleep apnea -Continue CPAP qhs  Chronic diastolic heart failure Stable.  GERD -Continue Protonix  Memory loss Patient is on Namenda  Depression/anxiety Continue Lexapro and Wellbutrin XL  DVT prophylaxis: Lovenox Code Status:   Code Status: Full Code Family Communication: None Disposition Plan: unsafe discharge at this time. Unable to discharge to SNF secondary to insurance denial. Plan now for discharge to ALF if able. TB test negative  Obesity Body mass index is 30.59 kg/m.   Consultants:   None  Procedures:   None  Antimicrobials:  Unasyn (7/10>>   Subjective: No  issues per patient. No cough, abdominal pain, dysuria. No symptoms to explain fever.  Objective: Vitals:   03/15/19 2045 03/15/19 2357 03/16/19 0417 03/16/19 0719  BP:  134/81 131/79 120/64  Pulse: (!) 104 (!) 102 90 91  Resp: 18 18 14 18   Temp:  98 F (36.7 C) 98.4 F (36.9 C) 98 F (36.7 C)  TempSrc:   Oral Oral  SpO2: 94% 96% 99% 97%  Weight:      Height:        Intake/Output Summary (Last 24 hours) at 03/16/2019 0906 Last data filed at 03/16/2019 0847 Gross per 24 hour  Intake 340 ml  Output -  Net 340 ml   Filed Weights   03/03/19 2154 03/04/19 0657  Weight: 100.7 kg 102.3 kg    Examination:  General exam: Appears calm and comfortable Respiratory system: Decreased breath sounds. Respiratory effort normal. Cardiovascular system: S1 & S2 heard, RRR. No murmurs, rubs, gallops or clicks. Gastrointestinal system: Abdomen is nondistended, soft and nontender. No organomegaly or masses felt. Normal bowel sounds heard. Central nervous system: Alert. No focal neurological deficits. Extremities: No edema. No calf tenderness Skin: No cyanosis. No rashes Psychiatry: Blunt affect.     Data Reviewed: I have personally reviewed following labs and imaging studies  CBC: Recent Labs  Lab 03/12/19 0519 03/15/19 1203 03/16/19 0457  WBC 10.0 32.1* 22.3*  NEUTROABS 7.2  --   --   HGB 15.4 15.3 14.5  HCT 46.1 45.1 43.0  MCV 99.1 98.0 98.4  PLT 352 318 176   Basic Metabolic Panel: Recent Labs  Lab 03/12/19 0519 03/16/19 0457  NA 138 139  K 3.8 4.0  CL 101 101  CO2 27 26  GLUCOSE 128* 123*  BUN 22 28*  CREATININE 0.95 1.32*  CALCIUM 9.4 9.1   GFR: Estimated Creatinine Clearance: 62.6 mL/min (A) (by C-G formula based on SCr of 1.32 mg/dL (H)). Liver Function Tests: Recent Labs  Lab 03/12/19 0519  AST 24  ALT 26  ALKPHOS 81  BILITOT 0.6  PROT 7.1  ALBUMIN 2.9*   No results for input(s): LIPASE, AMYLASE in the last 168 hours. No results for input(s):  AMMONIA in the last 168 hours. Coagulation Profile: No results for input(s): INR, PROTIME in the last 168 hours. Cardiac Enzymes: No results for input(s): CKTOTAL, CKMB, CKMBINDEX, TROPONINI in the last 168 hours. BNP (last 3 results) No results for input(s): PROBNP in the last 8760 hours. HbA1C: No results for input(s): HGBA1C in the last 72 hours. CBG: No results for input(s): GLUCAP in the last 168 hours. Lipid Profile: No results for input(s): CHOL, HDL, LDLCALC, TRIG, CHOLHDL, LDLDIRECT in the last 72 hours. Thyroid Function Tests: No results for input(s): TSH, T4TOTAL, FREET4, T3FREE, THYROIDAB in the last 72 hours. Anemia Panel: No results for input(s): VITAMINB12, FOLATE, FERRITIN, TIBC, IRON, RETICCTPCT in the last 72 hours. Sepsis Labs: Recent Labs  Lab 03/15/19 1203 03/16/19 0457  PROCALCITON 0.48 0.73    Recent Results (from the past 240 hour(s))  Novel Coronavirus, NAA (hospital order; send-out to ref lab)     Status: None   Collection Time: 03/11/19  1:42 PM   Specimen: Nasopharyngeal Swab; Respiratory  Result Value Ref Range Status   SARS-CoV-2, NAA NOT DETECTED NOT DETECTED Final    Comment: (NOTE) This test was developed and its performance characteristics determined by [employer]. This test has not been FDA cleared or approved. This test has been authorized by FDA under an Emergency Use Authorization (EUA). This test is only authorized for the duration of time the declaration that circumstances exist justifying the authorization of the emergency use of in vitro diagnostic tests for detection of SARS-CoV-2 virus and/or diagnosis of COVID-19 infection under section 564(b)(1) of the Act, 21 U.S.C. 308MVH-8(I)(6), unless the authorization is terminated or revoked sooner. When diagnostic testing is negative, the possibility of a false negative result should be considered in the context of a patient's recent exposures and the presence of clinical signs  and symptoms consistent with COVID-19. An individual without symptoms of COVID-19 and who is not shedding SARS-CoV-2 virus would expect to have a negative (not detected) result in this assay. Performed  At: [facility] [address] [place], [place] [id number] Rush Farmer MD LK:[id number]    [place]  Final    Comment: Performed at [facility], [place] [address] [place], [place] [address]         Radiology Studies: Dg Chest Port 1 View  Result Date: 03/15/2019 CLINICAL DATA:  Fever and cough. EXAM: PORTABLE CHEST 1 VIEW COMPARISON:  March 03, 2019 FINDINGS: There is either pleural thickening or a tiny effusion on the left. There appears to be increased density in the left retrocardiac region. No other interval changes. IMPRESSION: Suspected increasing opacity in the left retrocardiac region may represent atelectasis or infiltrate. Recommend attention on follow-up. Tiny left pleural effusion versus pleural thickening. Electronically Signed   By: Dorise Bullion III M.D   On: 03/15/2019 13:40        Scheduled Meds: . amLODipine  5 mg Oral Daily  . aspirin  81 mg Oral Daily  . clopidogrel  75 mg Oral Daily  . enoxaparin (LOVENOX) injection  40 mg Subcutaneous Q24H  .  memantine  7 mg Oral Daily  . pantoprazole  40 mg Oral Daily   Continuous Infusions: . sodium chloride    . ampicillin-sulbactam (UNASYN) IV 3 g (03/16/19 0844)     LOS: 12 days     Cordelia Poche, MD [facility] 03/16/2019, 9:06 AM  If 7PM-7AM, please contact night-coverage [email or web address]

## 2019-03-17 LAB — BASIC METABOLIC PANEL
Anion gap: 8 (ref 5–15)
BUN: 24 mg/dL — ABNORMAL HIGH (ref 8–23)
CO2: 26 mmol/L (ref 22–32)
Calcium: 8.7 mg/dL — ABNORMAL LOW (ref 8.9–10.3)
Chloride: 108 mmol/L (ref 98–111)
Creatinine, Ser: 1.15 mg/dL (ref 0.61–1.24)
GFR calc Af Amer: >60 mL/min (ref >60)
GFR calc non Af Amer: >60 mL/min (ref >60)
Glucose, Bld: 145 mg/dL — ABNORMAL HIGH (ref 70–99)
Potassium: 4 mmol/L (ref 3.5–5.1)
Sodium: 142 mmol/L (ref 135–145)

## 2019-03-17 LAB — CBC
HCT: 41.7 % (ref 39.0–52.0)
Hemoglobin: 13.4 g/dL (ref 13.0–17.0)
MCH: 32.7 pg (ref 26.0–34.0)
MCHC: 32.1 g/dL (ref 30.0–36.0)
MCV: 101.7 fL — ABNORMAL HIGH (ref 80.0–100.0)
Platelets: 218 K/uL (ref 150–400)
RBC: 4.1 MIL/uL — ABNORMAL LOW (ref 4.22–5.81)
RDW: 13.8 % (ref 11.5–15.5)
WBC: 7.3 K/uL (ref 4.0–10.5)
nRBC: 0 % (ref 0.0–0.2)

## 2019-03-17 LAB — URINE CULTURE: Culture: >=100000 — AB

## 2019-03-17 LAB — PROCALCITONIN: Procalcitonin: 0.42 ng/mL

## 2019-03-17 NOTE — Progress Notes (Signed)
PROGRESS NOTE    HAMDAN Blackburn  IRS:[id number] DOB: Jul 20, 1946 DOA: 03/03/2019 PCP: Hoyt Koch, MD   Brief Narrative: John Blackburn is a 73 y.o. medical history significant ofhypertension, hyperlipidemia, GERD, depression, CAD, stent placement, PE 2014 (not onanticoagulants currently),OSA on CPAP,dCHF. Patient presented with generalized weakness.   Assessment & Plan:   Principal Problem:   Generalized weakness Active Problems:   Hypertension   Hyperlipidemia   Obstructive sleep apnea   Chronic diastolic (congestive) heart failure (HCC)   GERD (gastroesophageal reflux disease)   Memory loss   Hypokalemia   Weakness generalized   CAD (coronary artery disease)   Generalized weakness Initial concern for stroke, with a negative MRI. TSH normal. Patient unsafe for discharge home secondary to inability to perform ADLs and he lives alone -Plan for discharge to ALF at this time  Fever Possible aspiration pneumonia vs urinary tract infection. Peak WBC of 32.1k, now significantly improved. Procalcitonin with initial increase, now trending down. Urine culture significant for multiple species. Blood culture with no growth -Unasyn -Repeat urine culture  AKI Unsure of etiology. Mild. Possibly secondary to infection. Improving. -IV fluids -Repeat BMP in AM  Essential hypertension Well controlled -Continue amlodipine  Dysphagia -Dysphagia 1 diet (puree), nectar thickened liquid per SLP recommendations  Obstructive sleep apnea -Continue CPAP qhs  Chronic diastolic heart failure Stable.  GERD -Continue Protonix  Memory loss Patient is on Namenda  Depression/anxiety Continue Lexapro and Wellbutrin XL  DVT prophylaxis: Lovenox Code Status:   Code Status: Full Code Family Communication: None Disposition Plan: Not yet medically ready for discharge secondary to infection. Likely medically ready in 24 hours. Unsafe discharge at this time. Unable to  discharge to SNF secondary to insurance denial. Plan now for discharge to ALF if able. TB test negative  Obesity Body mass index is 30.59 kg/m.   Consultants:   None  Procedures:   None  Antimicrobials:  Unasyn (7/10>>   Subjective: No concerns today. No cough with eating. No dysuria.  Objective: Vitals:   03/16/19 2338 03/17/19 0000 03/17/19 0352 03/17/19 0747  BP: 110/63  (!) 143/68 140/86  Pulse: 95  88 83  Resp: 18  18 16   Temp: 99.7 F (37.6 C)  99.7 F (37.6 C) 98.5 F (36.9 C)  TempSrc: Oral  Oral Oral  SpO2: 96% 96% 99% 98%  Weight:      Height:        Intake/Output Summary (Last 24 hours) at 03/17/2019 1120 Last data filed at 03/17/2019 1013 Gross per 24 hour  Intake 1628.81 ml  Output 1100 ml  Net 528.81 ml   Filed Weights   03/03/19 2154 03/04/19 0657  Weight: 100.7 kg 102.3 kg    Examination:  General exam: Appears calm and comfortable Respiratory system: Clear to auscultation. Respiratory effort normal. Cardiovascular system: S1 & S2 heard, RRR. No murmurs, rubs, gallops or clicks. Gastrointestinal system: Abdomen is nondistended, soft and nontender. No organomegaly or masses felt. Normal bowel sounds heard. Central nervous system: Alert. No focal neurological deficits. Extremities: No edema. No calf tenderness Skin: No cyanosis. No rashes Psychiatry: Judgement and insight appear normal. Blunt affect.     Data Reviewed: I have personally reviewed following labs and imaging studies  CBC: Recent Labs  Lab 03/12/19 0519 03/15/19 1203 03/16/19 0457 03/17/19 0503  WBC 10.0 32.1* 22.3* 7.3  NEUTROABS 7.2  --   --   --   HGB 15.4 15.3 14.5 13.4  HCT 46.1 45.1 43.0 41.7  MCV 99.1 98.0 98.4 101.7*  PLT 352 318 284 825   Basic Metabolic Panel: Recent Labs  Lab 03/12/19 0519 03/16/19 0457 03/17/19 0503  NA 138 139 142  K 3.8 4.0 4.0  CL 101 101 108  CO2 27 26 26   GLUCOSE 128* 123* 145*  BUN 22 28* 24*  CREATININE 0.95 1.32*  1.15  CALCIUM 9.4 9.1 8.7*   GFR: Estimated Creatinine Clearance: 70.8 mL/min (by C-G formula based on SCr of 1.15 mg/dL). Liver Function Tests: Recent Labs  Lab 03/12/19 0519  AST 24  ALT 26  ALKPHOS 81  BILITOT 0.6  PROT 7.1  ALBUMIN 2.9*   No results for input(s): LIPASE, AMYLASE in the last 168 hours. No results for input(s): AMMONIA in the last 168 hours. Coagulation Profile: No results for input(s): INR, PROTIME in the last 168 hours. Cardiac Enzymes: No results for input(s): CKTOTAL, CKMB, CKMBINDEX, TROPONINI in the last 168 hours. BNP (last 3 results) No results for input(s): PROBNP in the last 8760 hours. HbA1C: No results for input(s): HGBA1C in the last 72 hours. CBG: No results for input(s): GLUCAP in the last 168 hours. Lipid Profile: No results for input(s): CHOL, HDL, LDLCALC, TRIG, CHOLHDL, LDLDIRECT in the last 72 hours. Thyroid Function Tests: No results for input(s): TSH, T4TOTAL, FREET4, T3FREE, THYROIDAB in the last 72 hours. Anemia Panel: No results for input(s): VITAMINB12, FOLATE, FERRITIN, TIBC, IRON, RETICCTPCT in the last 72 hours. Sepsis Labs: Recent Labs  Lab 03/15/19 1203 03/16/19 0457 03/17/19 0503  PROCALCITON 0.48 0.73 0.42    Recent Results (from the past 240 hour(s))  Novel Coronavirus, NAA (hospital order; send-out to ref lab)     Status: None   Collection Time: 03/11/19  1:42 PM   Specimen: Nasopharyngeal Swab; Respiratory  Result Value Ref Range Status   SARS-CoV-2, NAA NOT DETECTED NOT DETECTED Final    Comment: (NOTE) This test was developed and its performance characteristics determined by [employer]. This test has not been FDA cleared or approved. This test has been authorized by FDA under an Emergency Use Authorization (EUA). This test is only authorized for the duration of time the declaration that circumstances exist justifying the authorization of the emergency use of in vitro diagnostic tests for  detection of SARS-CoV-2 virus and/or diagnosis of COVID-19 infection under section 564(b)(1) of the Act, 21 U.S.C. 053ZJQ-7(H)(4), unless the authorization is terminated or revoked sooner. When diagnostic testing is negative, the possibility of a false negative result should be considered in the context of a patient's recent exposures and the presence of clinical signs and symptoms consistent with COVID-19. An individual without symptoms of COVID-19 and who is not shedding SARS-CoV-2 virus would expect to have a negative (not detected) result in this assay. Performed  At: [facility] [address] [place], [place] [id number] Rush Farmer MD XB:[id number]    [place]  Final    Comment: Performed at [facility], [place] [address] [place], [place] [address]  Culture, blood (routine x 2)     Status: None (Preliminary result)   Collection Time: 03/15/19  6:35 PM   Specimen: BLOOD  Result Value Ref Range Status   Specimen Description BLOOD LEFT ANTECUBITAL  Final   Special Requests   Final    BOTTLES DRAWN AEROBIC AND ANAEROBIC Blood Culture results may not be optimal due to an excessive volume of blood received in culture bottles   Culture   Final    NO GROWTH <  24 HOURS Performed at [facility], [place] [address] [place], [place] [address]    Report Status PENDING  Incomplete  Culture, blood (routine x 2)     Status: None (Preliminary result)   Collection Time: 03/15/19  6:59 PM   Specimen: BLOOD RIGHT HAND  Result Value Ref Range Status   Specimen Description BLOOD RIGHT HAND  Final   Special Requests AEROBIC BOTTLE ONLY Blood Culture adequate volume  Final   Culture   Final    NO GROWTH < 24 HOURS Performed at [facility], [place] [address] [place], [place] [address]    Report Status PENDING  Incomplete  Culture, Urine     Status: Abnormal   Collection Time: 03/15/19 10:48 PM   Specimen: Urine, Clean Catch  Result Value Ref  Range Status   Specimen Description URINE, CLEAN CATCH  Final   Special Requests   Final    NONE Performed at [facility], [place] [address] [place], [place] [address]    Culture (A)  Final    >=100,000 COLONIES/mL MULTIPLE SPECIES PRESENT, SUGGEST RECOLLECTION   Report Status 03/17/2019 FINAL  Final         Radiology Studies: Dg Chest Port 1 View  Result Date: 03/15/2019 CLINICAL DATA:  Fever and cough. EXAM: PORTABLE CHEST 1 VIEW COMPARISON:  March 03, 2019 FINDINGS: There is either pleural thickening or a tiny effusion on the left. There appears to be increased density in the left retrocardiac region. No other interval changes. IMPRESSION: Suspected increasing opacity in the left retrocardiac region may represent atelectasis or infiltrate. Recommend attention on follow-up. Tiny left pleural effusion versus pleural thickening. Electronically Signed   By: Dorise Bullion III M.D   On: 03/15/2019 13:40        Scheduled Meds: . amLODipine  5 mg Oral Daily  . aspirin  81 mg Oral Daily  . clopidogrel  75 mg Oral Daily  . enoxaparin (LOVENOX) injection  40 mg Subcutaneous Q24H  . memantine  7 mg Oral Daily  . pantoprazole  40 mg Oral Daily   Continuous Infusions: . ampicillin-sulbactam (UNASYN) IV 3 g (03/17/19 1003)     LOS: 13 days     Cordelia Poche, MD [facility] 03/17/2019, 11:20 AM  If 7PM-7AM, please contact night-coverage [email or web address]

## 2019-03-17 NOTE — Progress Notes (Signed)
Pt placed on nasal CPAP and tolerating well at this time.  RT will continue to monitor.

## 2019-03-18 LAB — URINE CULTURE: Culture: <10000 — AB

## 2019-03-18 MED ORDER — RESOURCE THICKENUP CLEAR PO POWD
ORAL | Status: DC | PRN
  Filled 2019-03-18: qty 125

## 2019-03-18 NOTE — Progress Notes (Signed)
Pharmacy Antibiotic Note  John Blackburn is a 73 y.o. male admitted on 03/03/2019 with generalized weakness.  Now with fever and leukocytosis, so Pharmacy has been consulted for Unasyn dosing for aspiration PNA.  Day # 4 of Unasyn.  SCr 1.15, CrCL 71 ml/min, Tmax 101, WBC down to within normal limits, PCT down to 0.42.   Plan: Unasyn 3gm IV Q6H Monitor renal fxn, clinical progress  Height: 6' (182.9 cm) Weight: 225 lb 8.5 oz (102.3 kg) IBW/kg (Calculated) : 77.6  Temp (24hrs), Avg:98.6 F (37 C), Min:97.9 F (36.6 C), Max:99.1 F (37.3 C)  Recent Labs  Lab 03/12/19 0519 03/15/19 1203 03/16/19 0457 03/17/19 0503  WBC 10.0 32.1* 22.3* 7.3  CREATININE 0.95  --  1.32* 1.15    Estimated Creatinine Clearance: 70.8 mL/min (by C-G formula based on SCr of 1.15 mg/dL).    Allergies  Allergen Reactions  . Cephalexin Rash  . Levofloxacin Rash    Unasyn 7/10 >>  6/28 covid - negative 6/28 UCx - negative 6/28 BCx -  negative 7/6 covid - negative 7/10 UCx - multiple species present, suggest recollection 7/10 BCx - ngtd x2 days 7/12 UCx - insignificant growth  Sloan Leiter, PharmD, BCPS, BCCCP Clinical Pharmacist Please refer to [facility] for [place] numbers 03/18/2019, 1:49 PM

## 2019-03-18 NOTE — Progress Notes (Signed)
Physical Therapy Treatment Patient Details Name: John Blackburn MRN: [id number] DOB: Jan 31, 1946 Today's Date: 03/18/2019    History of Present Illness Patient is a 73 year old male. Per notes sons report that over the past 2 weeks the patient has had increasing weakness. He is confused today. 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 PMH: PE, sleep apnea, skin cancer, MI, depression, GERD, HTN, allergies, OA, bilateral knee replacements     PT Comments    Patient seen for mobility progression. Pt is making progress toward PT goals and following single step commands with increased time.  Pt is able to ambulate 10 ft with RW and mod A (+2 for safety/chair follow) this session. Continue to progress as tolerated.   Follow Up Recommendations  SNF     Equipment Recommendations  None recommended by PT    Recommendations for Other Services       Precautions / Restrictions Precautions Precautions: Fall Restrictions Weight Bearing Restrictions: No    Mobility  Bed Mobility Overal bed mobility: Needs Assistance Bed Mobility: Supine to Sit     Supine to sit: HOB elevated;Mod assist     General bed mobility comments: cues for sequencing and for use of bed rail going toward R side; assistance to elevate trunk into sitting  Transfers Overall transfer level: Needs assistance Equipment used: Rolling walker (2 wheeled) Transfers: Sit to/from Stand Sit to Stand: From elevated surface;Mod assist;+2 physical assistance         General transfer comment: cues for safe hand placement; assistance to power up into standing and to gain balance upon standing due to posterior bias  Ambulation/Gait Ambulation/Gait assistance: Mod assist;+2 safety/equipment Gait Distance (Feet): 10 Feet Assistive device: Rolling walker (2 wheeled) Gait Pattern/deviations: Step-through pattern;Trunk flexed;Decreased step length - right;Decreased step length - left;Decreased dorsiflexion - right;Decreased  dorsiflexion - left Gait velocity: decreased   General Gait Details: multimodal cues for step lengths, upright posture, and sequencing and vc for maintaining safe proximity to RW ; assistance for balance and guiding RW   Stairs             Wheelchair Mobility    Modified Rankin (Stroke Patients Only) Modified Rankin (Stroke Patients Only) Pre-Morbid Rankin Score: Slight disability Modified Rankin: Moderately severe disability     Balance Overall balance assessment: Needs assistance Sitting-balance support: Feet supported;Bilateral upper extremity supported Sitting balance-Leahy Scale: Poor   Postural control: Posterior lean;Left lateral lean Standing balance support: Bilateral upper extremity supported Standing balance-Leahy Scale: Poor                              Cognition Arousal/Alertness: Awake/alert Behavior During Therapy: WFL for tasks assessed/performed Overall Cognitive Status: Impaired/Different from baseline Area of Impairment: Memory;Safety/judgement;Following commands;Problem solving                     Memory: Decreased short-term memory Following Commands: Follows one step commands with increased time;Follows one step commands consistently Safety/Judgement: Decreased awareness of safety;Decreased awareness of deficits   Problem Solving: Difficulty sequencing;Decreased initiation;Slow processing;Requires verbal cues;Requires tactile cues General Comments: pt able to recall that his birthday was yesterday and states he is "70"      Exercises      General Comments        Pertinent Vitals/Pain Pain Assessment: No/denies pain    Home Living  Prior Function            PT Goals (current goals can now be found in the care plan section) Acute Rehab PT Goals Patient Stated Goal: none stated Progress towards PT goals: Progressing toward goals    Frequency    Min 4X/week      PT Plan  Current plan remains appropriate    Co-evaluation              AM-PAC PT "6 Clicks" Mobility   Outcome Measure  Help needed turning from your back to your side while in a flat bed without using bedrails?: A Lot Help needed moving from lying on your back to sitting on the side of a flat bed without using bedrails?: A Lot Help needed moving to and from a bed to a chair (including a wheelchair)?: A Lot Help needed standing up from a chair using your arms (e.g., wheelchair or bedside chair)?: A Lot Help needed to walk in hospital room?: A Lot Help needed climbing 3-5 steps with a railing? : Total 6 Click Score: 11    End of Session Equipment Utilized During Treatment: Gait belt Activity Tolerance: Patient tolerated treatment well Patient left: with call bell/phone within reach;in chair;with chair alarm set Nurse Communication: Mobility status PT Visit Diagnosis: Other abnormalities of gait and mobility (R26.89);Muscle weakness (generalized) (M62.81);Difficulty in walking, not elsewhere classified (R26.2)     Time: 1216-2446 PT Time Calculation (min) (ACUTE ONLY): 21 min  Charges:  $Gait Training: 8-22 mins                     Earney Navy, PTA Acute Rehabilitation Services Pager: [phone] Office: [phone]     Darliss Cheney 03/18/2019, 3:18 PM

## 2019-03-18 NOTE — Progress Notes (Signed)
Patient refuse NIV for the night. Machine is at bedside. Instructed patient to notify staff if changes his mind.

## 2019-03-18 NOTE — Progress Notes (Addendum)
PROGRESS NOTE    John Blackburn  WUJ:[id number] DOB: Jan 25, 1946 DOA: 03/03/2019 PCP: Hoyt Koch, MD   Brief Narrative: John Blackburn is a 73 y.o. medical history significant ofhypertension, hyperlipidemia, GERD, depression, CAD, stent placement, PE 2014 (not onanticoagulants currently),OSA on CPAP,dCHF. Patient presented with generalized weakness.   Assessment & Plan:   Principal Problem:   Generalized weakness Active Problems:   Hypertension   Hyperlipidemia   Obstructive sleep apnea   Chronic diastolic (congestive) heart failure (HCC)   GERD (gastroesophageal reflux disease)   Memory loss   Hypokalemia   Weakness generalized   CAD (coronary artery disease)   Generalized weakness Initial concern for stroke, with a negative MRI. TSH normal. Patient unsafe for discharge home secondary to inability to perform ADLs and he lives alone -Plan for discharge to ALF at this time  Fever Possible aspiration pneumonia vs urinary tract infection. Peak WBC of 32.1k, now significantly improved. Procalcitonin with initial increase, now trending down. Urine culture significant for multiple species. Blood culture with no growth -Unasyn, plan to treat for 7 days of antibiotics -Repeat urine culture  AKI Unsure of etiology. Mild. Possibly secondary to infection. Improving.  Essential hypertension Well controlled -Continue amlodipine  Dysphagia -Dysphagia 1 diet (puree), nectar thickened liquid per SLP recommendations  Obstructive sleep apnea -Continue CPAP qhs  Chronic diastolic heart failure Stable.  GERD -Continue Protonix  Memory loss Patient is on Namenda  Depression/anxiety Continue Lexapro and Wellbutrin XL  DVT prophylaxis: Lovenox Code Status:   Code Status: Full Code Family Communication: None Disposition Plan: Medically ready for discharge. Unsafe discharge at this time. Unable to discharge to SNF secondary to insurance denial. Plan now for  discharge to ALF if able. TB test negative  Obesity Body mass index is 30.59 kg/m.   Consultants:   None  Procedures:   None  Antimicrobials:  Unasyn (7/10>>   Subjective: No issues today.  Objective: Vitals:   03/17/19 2120 03/18/19 0000 03/18/19 0454 03/18/19 0730  BP:  132/77 137/87 (!) 141/85  Pulse: 85 84 79 79  Resp: 16 18 18 18   Temp:  98.9 F (37.2 C) 98.4 F (36.9 C) 97.9 F (36.6 C)  TempSrc:  Oral Oral Oral  SpO2: 97% 98% 99% 100%  Weight:      Height:        Intake/Output Summary (Last 24 hours) at 03/18/2019 1119 Last data filed at 03/18/2019 0646 Gross per 24 hour  Intake 954.87 ml  Output 800 ml  Net 154.87 ml   Filed Weights   03/03/19 2154 03/04/19 0657  Weight: 100.7 kg 102.3 kg    Examination:  General exam: Appears calm and comfortable. Resting with towel over his eyes Respiratory system: Clear to auscultation. Respiratory effort normal. Cardiovascular system: S1 & S2 heard, RRR. No murmurs, rubs, gallops or clicks. Gastrointestinal system: Abdomen is nondistended, soft and nontender. No organomegaly or masses felt. Normal bowel sounds heard. Central nervous system: Alert. No focal neurological deficits. Extremities: No edema. No calf tenderness Skin: No cyanosis. No rashes Psychiatry: Judgement and insight appear normal. Mood & affect appropriate.     Data Reviewed: I have personally reviewed following labs and imaging studies  CBC: Recent Labs  Lab 03/12/19 0519 03/15/19 1203 03/16/19 0457 03/17/19 0503  WBC 10.0 32.1* 22.3* 7.3  NEUTROABS 7.2  --   --   --   HGB 15.4 15.3 14.5 13.4  HCT 46.1 45.1 43.0 41.7  MCV 99.1 98.0 98.4 101.7*  PLT 352 318 284 956   Basic Metabolic Panel: Recent Labs  Lab 03/12/19 0519 03/16/19 0457 03/17/19 0503  NA 138 139 142  K 3.8 4.0 4.0  CL 101 101 108  CO2 27 26 26   GLUCOSE 128* 123* 145*  BUN 22 28* 24*  CREATININE 0.95 1.32* 1.15  CALCIUM 9.4 9.1 8.7*   GFR: Estimated  Creatinine Clearance: 70.8 mL/min (by C-G formula based on SCr of 1.15 mg/dL). Liver Function Tests: Recent Labs  Lab 03/12/19 0519  AST 24  ALT 26  ALKPHOS 81  BILITOT 0.6  PROT 7.1  ALBUMIN 2.9*   No results for input(s): LIPASE, AMYLASE in the last 168 hours. No results for input(s): AMMONIA in the last 168 hours. Coagulation Profile: No results for input(s): INR, PROTIME in the last 168 hours. Cardiac Enzymes: No results for input(s): CKTOTAL, CKMB, CKMBINDEX, TROPONINI in the last 168 hours. BNP (last 3 results) No results for input(s): PROBNP in the last 8760 hours. HbA1C: No results for input(s): HGBA1C in the last 72 hours. CBG: No results for input(s): GLUCAP in the last 168 hours. Lipid Profile: No results for input(s): CHOL, HDL, LDLCALC, TRIG, CHOLHDL, LDLDIRECT in the last 72 hours. Thyroid Function Tests: No results for input(s): TSH, T4TOTAL, FREET4, T3FREE, THYROIDAB in the last 72 hours. Anemia Panel: No results for input(s): VITAMINB12, FOLATE, FERRITIN, TIBC, IRON, RETICCTPCT in the last 72 hours. Sepsis Labs: Recent Labs  Lab 03/15/19 1203 03/16/19 0457 03/17/19 0503  PROCALCITON 0.48 0.73 0.42    Recent Results (from the past 240 hour(s))  Novel Coronavirus, NAA (hospital order; send-out to ref lab)     Status: None   Collection Time: 03/11/19  1:42 PM   Specimen: Nasopharyngeal Swab; Respiratory  Result Value Ref Range Status   SARS-CoV-2, NAA NOT DETECTED NOT DETECTED Final    Comment: (NOTE) This test was developed and its performance characteristics determined by [employer]. This test has not been FDA cleared or approved. This test has been authorized by FDA under an Emergency Use Authorization (EUA). This test is only authorized for the duration of time the declaration that circumstances exist justifying the authorization of the emergency use of in vitro diagnostic tests for detection of SARS-CoV-2 virus and/or diagnosis of  COVID-19 infection under section 564(b)(1) of the Act, 21 U.S.C. 387FIE-3(P)(2), unless the authorization is terminated or revoked sooner. When diagnostic testing is negative, the possibility of a false negative result should be considered in the context of a patient's recent exposures and the presence of clinical signs and symptoms consistent with COVID-19. An individual without symptoms of COVID-19 and who is not shedding SARS-CoV-2 virus would expect to have a negative (not detected) result in this assay. Performed  At: [facility] [address] [place], [place] [id number] Rush Farmer MD AY:[id number]    [place]  Final    Comment: Performed at [facility], [place] [address] [place], [place] [address]  Culture, blood (routine x 2)     Status: None (Preliminary result)   Collection Time: 03/15/19  6:35 PM   Specimen: BLOOD  Result Value Ref Range Status   Specimen Description BLOOD LEFT ANTECUBITAL  Final   Special Requests   Final    BOTTLES DRAWN AEROBIC AND ANAEROBIC Blood Culture results may not be optimal due to an excessive volume of blood received in culture bottles   Culture   Final    NO GROWTH 2 DAYS Performed at [facility], [place] [address] [place], [place] [address]    Report Status PENDING  Incomplete  Culture, blood (routine x 2)     Status: None (Preliminary result)   Collection Time: 03/15/19  6:59 PM   Specimen: BLOOD RIGHT HAND  Result Value Ref Range Status   Specimen Description BLOOD RIGHT HAND  Final   Special Requests AEROBIC BOTTLE ONLY Blood Culture adequate volume  Final   Culture   Final    NO GROWTH 2 DAYS Performed at [facility], [place] [address], [place], [place] [address]    Report Status PENDING  Incomplete  Culture, Urine     Status: Abnormal   Collection Time: 03/15/19 10:48 PM   Specimen: Urine, Clean Catch  Result Value Ref Range Status   Specimen Description URINE, CLEAN CATCH   Final   Special Requests   Final    NONE Performed at [facility], [place] [address] [place], [place] [address]    Culture (A)  Final    >=100,000 COLONIES/mL MULTIPLE SPECIES PRESENT, SUGGEST RECOLLECTION   Report Status 03/17/2019 FINAL  Final  Culture, Urine     Status: Abnormal   Collection Time: 03/17/19 10:44 AM   Specimen: Urine, Random  Result Value Ref Range Status   Specimen Description URINE, RANDOM  Final   Special Requests NONE  Final   Culture (A)  Final    <10,000 COLONIES/mL INSIGNIFICANT GROWTH Performed at [facility], [place] [address], [place], [place] [address]    Report Status 03/18/2019 FINAL  Final         Radiology Studies: No results found.      Scheduled Meds: . amLODipine  5 mg Oral Daily  . aspirin  81 mg Oral Daily  . clopidogrel  75 mg Oral Daily  . enoxaparin (LOVENOX) injection  40 mg Subcutaneous Q24H  . memantine  7 mg Oral Daily  . pantoprazole  40 mg Oral Daily   Continuous Infusions: . ampicillin-sulbactam (UNASYN) IV 3 g (03/18/19 1104)     LOS: 14 days     Cordelia Poche, MD [facility] 03/18/2019, 11:18 AM  If 7PM-7AM, please contact night-coverage [email or web address]

## 2019-03-18 NOTE — NC FL2 (Signed)
[place] LEVEL OF CARE SCREENING TOOL     IDENTIFICATION  Patient Name: John Blackburn Birthdate: May 03, 1946 Sex: male Admission Date (Current Location): 03/03/2019  [facility] and [place] Number:  Herbalist and Address:  [facility], [place] [address], [place], [place] [address]      Provider Number: [id number]  Attending Physician Name and Address:  Mariel Aloe, MD  Relative Name and Phone Number:       Current Level of Care: Hospital Recommended Level of Care: Skilled Nursing Facility(long term placement) Prior Approval Number:    Date Approved/Denied:   PASRR Number: [id number]  Discharge Plan: SNF    Current Diagnoses: Patient Active Problem List   Diagnosis Date Noted  . Weakness generalized 03/04/2019  . CAD (coronary artery disease) 03/04/2019  . Generalized weakness 03/04/2019  . Vascular dementia ([place]) 07/03/2018  . ARF (acute renal failure) ([place]) 04/23/2018  . Hypokalemia 04/23/2018  . Anorexia 04/23/2018  . Leucocytosis 04/23/2018  . EKG abnormalities 04/23/2018  . B12 deficiency 04/12/2018  . Memory loss 11/28/2017  . Balance problem 11/28/2017  . skin cancer   . Sleep apnea, primary central   . GERD (gastroesophageal reflux disease)   . MDD (major depressive disorder), single episode, moderate ([place])   . CAD S/P percutaneous coronary angioplasty 09/02/2017  . Chronic diastolic (congestive) heart failure ([place]) 08/30/2017  . Elevated blood sugar 01/17/2017  . Incontinence of feces   . Allergic rhinitis 10/15/2013  . Chronic venous insufficiency 07/20/2013  . History of pulmonary embolism 07/08/2013  . ED (erectile dysfunction) 06/01/2011  . Arthritis   . Hypertension   . Hyperlipidemia   . Obstructive sleep apnea     Orientation RESPIRATION BLADDER Height & Weight     Self  Normal Incontinent Weight: 102.3 kg Height:  6' (182.9 cm)  BEHAVIORAL SYMPTOMS/MOOD NEUROLOGICAL BOWEL NUTRITION  STATUS      Incontinent Diet(dysphagia 1 nectar thick)  AMBULATORY STATUS COMMUNICATION OF NEEDS Skin   Extensive Assist Verbally Bruising(ecchymosis on arms and legs/ MASD on bottom)                       Personal Care Assistance Level of Assistance  Bathing, Feeding, Dressing Bathing Assistance: Maximum assistance Feeding assistance: Limited assistance Dressing Assistance: Maximum assistance     Functional Limitations Info  Sight, Hearing, Speech Sight Info: Adequate Hearing Info: Adequate Speech Info: Adequate    SPECIAL CARE FACTORS FREQUENCY  PT (By licensed PT), OT (By licensed OT), Speech therapy     PT Frequency: 5x/wk OT Frequency: 5x/wk     Speech Therapy Frequency: 5x/wk      Contractures Contractures Info: Not present    Additional Factors Info  Code Status, Allergies, Psychotropic Code Status Info: Full Allergies Info: Cephalexin/ levofloxacin Psychotropic Info: Namenda XR 7 mg daily         Current Medications (03/18/2019):  This is the current hospital active medication list Current Facility-Administered Medications  Medication Dose Route Frequency Provider Last Rate Last Dose  . acetaminophen (TYLENOL) tablet 650 mg  650 mg Oral Q6H PRN Ivor Costa, MD       Or  . acetaminophen (TYLENOL) suppository 650 mg  650 mg Rectal Q6H PRN Ivor Costa, MD      . amLODipine (NORVASC) tablet 5 mg  5 mg Oral Daily Georgette Shell, MD   5 mg at 03/17/19 1005  . Ampicillin-Sulbactam (UNASYN) 3 g in  sodium chloride 0.9 % 100 mL IVPB  3 g Intravenous Q6H Dang, Thuy D, RPH 200 mL/hr at 03/18/19 0534 3 g at 03/18/19 0534  . aspirin chewable tablet 81 mg  81 mg Oral Daily Ivor Costa, MD   81 mg at 03/17/19 1005  . clopidogrel (PLAVIX) tablet 75 mg  75 mg Oral Daily Ivor Costa, MD   75 mg at 03/17/19 1005  . enoxaparin (LOVENOX) injection 40 mg  40 mg Subcutaneous Q24H Ivor Costa, MD   40 mg at 03/18/19 0533  . hydrALAZINE (APRESOLINE) injection 5 mg  5 mg  Intravenous Q2H PRN Ivor Costa, MD      . loratadine (CLARITIN) tablet 10 mg  10 mg Oral Daily PRN Ivor Costa, MD      . Melatonin TABS 3 mg  3 mg Oral QHS PRN Ivor Costa, MD      . memantine (NAMENDA XR) 24 hr capsule 7 mg  7 mg Oral Daily Ivor Costa, MD   7 mg at 03/17/19 1004  . nitroGLYCERIN (NITROSTAT) SL tablet 0.4 mg  0.4 mg Sublingual Q5 min PRN Ivor Costa, MD      . pantoprazole (PROTONIX) EC tablet 40 mg  40 mg Oral Daily Ivor Costa, MD   40 mg at 03/17/19 1005  . senna-docusate (Senokot-S) tablet 1 tablet  1 tablet Oral BID PRN Georgette Shell, MD         Discharge Medications: Please see discharge summary for a list of discharge medications.  Relevant Imaging Results:  Relevant Lab Results:   Additional Information SS#: [id number]----[facility] is a long term placement patient with MEDICAID pending  Pollie Friar, RN

## 2019-03-18 NOTE — Progress Notes (Signed)
  Speech Language Pathology Treatment: Dysphagia  Patient Details Name: John Blackburn MRN: [id number] DOB: March 15, 1946 Today's Date: 03/18/2019 Time: 1025-1040 SLP Time Calculation (min) (ACUTE ONLY): 15 min  Assessment / Plan / Recommendation Clinical Impression  He continues to have delayed cough following thin water trials and would benefit from MBS to investigate etiology and potential treatment recommendations. Improved mastication and manipulation with solid, therefore upgraded to regular. Continue nectar until MBS- planning for tomorrow morning. Pills can be whole in puree versus crushed.    HPI HPI: Patient is a 73 year old male. Per notes sons report that over the past 2 weeks the patient has had increasing weakness. He is confused today. 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 PMH: PE, sleep apnea, skin cancer, MI, depression, GERD, HTN, allergies, OA, bilateral knee replacements      SLP Plan  Continue with current plan of care       Recommendations  Liquids provided via: Cup Medication Administration: Whole meds with puree Compensations: Slow rate;Small sips/bites;Lingual sweep for clearance of pocketing Postural Changes and/or Swallow Maneuvers: Seated upright 90 degrees                Oral Care Recommendations: Oral care BID Follow up Recommendations: Other (comment)(TBD) SLP Visit Diagnosis: Dysphagia, oropharyngeal phase (R13.12) Plan: Continue with current plan of care                      Houston Siren 03/18/2019, 10:51 AM   Orbie Pyo Colvin Caroli.Ed Risk analyst [phone] Office [phone]

## 2019-03-19 ENCOUNTER — Inpatient Hospital Stay (HOSPITAL_COMMUNITY): Payer: PPO

## 2019-03-19 NOTE — Progress Notes (Signed)
Modified Barium Swallow Progress Note  Patient Details  Name: John Blackburn MRN: [id number] Date of Birth: Aug 09, 1946  Today's Date: 03/19/2019  Modified Barium Swallow completed.  Full report located under Chart Review in the Imaging Section.  Brief recommendations include the following:  Clinical Impression  Pt's oropharyngeal range of motion and strength noted to be functional although residue in valleculae ranged from none to moderate (following solid). While swallowing barium pill with thin there was flash penetration of thin that is common likely due to coordination during with dual consistencies. Residue was cleared with sips thin. Minimal and inconsistent instances of barium in cervical esophagus area. Pill hesitated in esophagus before transiting with sips thin. Recommend thin liquids, Dys 3 texture, no straws and pills whole in applesauce. Speech Pathology will continue to follow.        Swallow Evaluation Recommendations       SLP Diet Recommendations: Dysphagia 3 (Mech soft) solids;Thin liquid   Liquid Administration via: Cup;Straw   Medication Administration: Whole meds with puree   Supervision: Patient able to self feed;Intermittent supervision to cue for compensatory strategies   Compensations: Small sips/bites;Slow rate   Postural Changes: Seated upright at 90 degrees   Oral Care Recommendations: Oral care BID        Houston Siren 03/19/2019,1:02 PM   Orbie Pyo Colvin Caroli.Ed Risk analyst [phone] Office [phone]

## 2019-03-19 NOTE — Progress Notes (Signed)
Physical Therapy Treatment Patient Details Name: John Blackburn MRN: [id number] DOB: 10-28-1945 Today's Date: 03/19/2019    History of Present Illness Patient is a 73 year old male. Per notes sons report that over the past 2 weeks the patient has had increasing weakness. He is confused today. 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 PMH: PE, sleep apnea, skin cancer, MI, depression, GERD, HTN, allergies, OA, bilateral knee replacements     PT Comments    Patient seen for mobility progression. Pt continues to make progress toward PT goals and tolerated increased activity this session. Pt requires min-max A +2 for functional transfer training with increased assist required when fatigued and mod A for short distance gait training. Continue to progress as tolerated.     Follow Up Recommendations  SNF     Equipment Recommendations  None recommended by PT    Recommendations for Other Services       Precautions / Restrictions Precautions Precautions: Fall Restrictions Weight Bearing Restrictions: No    Mobility  Bed Mobility Overal bed mobility: Needs Assistance Bed Mobility: Supine to Sit     Supine to sit: HOB elevated;Min guard     General bed mobility comments: pt requires multimodal cues for sequencing and hand over hand assist for reach toward bed rail; min guard for safety and increased time and effort needed  Transfers Overall transfer level: Needs assistance Equipment used: Rolling walker (2 wheeled) Transfers: Sit to/from Stand Sit to Stand: From elevated surface;Mod assist;+2 physical assistance;Min assist;Max assist         General transfer comment: pt stood 3-4 times during session; cues for safe hand placement; pt required varying levels of assistance throughout session with max A +2 for initial stand from EOB and min A +2 to stand from [facility] and then when fatigued mod A +2 to stand from [facility]  Ambulation/Gait Ambulation/Gait assistance: Mod assist Gait  Distance (Feet): 8 Feet Assistive device: Rolling walker (2 wheeled) Gait Pattern/deviations: Step-through pattern;Trunk flexed;Decreased step length - right;Decreased step length - left;Decreased dorsiflexion - right;Decreased dorsiflexion - left Gait velocity: decreased   General Gait Details: multimodal cues for step lengths, upright posture, and sequencing and vc for maintaining safe proximity to RW ; assistance for balance and guiding RW   Stairs             Wheelchair Mobility    Modified Rankin (Stroke Patients Only) Modified Rankin (Stroke Patients Only) Pre-Morbid Rankin Score: Slight disability Modified Rankin: Moderately severe disability     Balance Overall balance assessment: Needs assistance Sitting-balance support: Feet supported;Bilateral upper extremity supported Sitting balance-Leahy Scale: Poor   Postural control: Posterior lean;Left lateral lean Standing balance support: Bilateral upper extremity supported Standing balance-Leahy Scale: Poor                              Cognition Arousal/Alertness: Awake/alert Behavior During Therapy: WFL for tasks assessed/performed Overall Cognitive Status: Impaired/Different from baseline Area of Impairment: Memory;Safety/judgement;Following commands;Problem solving                 Orientation Level: Disoriented to;Situation;Time Current Attention Level: Sustained Memory: Decreased short-term memory Following Commands: Follows one step commands with increased time;Follows one step commands consistently Safety/Judgement: Decreased awareness of safety;Decreased awareness of deficits   Problem Solving: Difficulty sequencing;Decreased initiation;Slow processing;Requires verbal cues;Requires tactile cues        Exercises      General Comments  Pertinent Vitals/Pain Pain Assessment: No/denies pain    Home Living                      Prior Function            PT Goals  (current goals can now be found in the care plan section) Acute Rehab PT Goals Patient Stated Goal: none stated Progress towards PT goals: Progressing toward goals    Frequency    Min 4X/week      PT Plan Current plan remains appropriate    Co-evaluation PT/OT/SLP Co-Evaluation/Treatment: Yes Reason for Co-Treatment: Necessary to address cognition/behavior during functional activity;For patient/therapist safety;To address functional/ADL transfers PT goals addressed during session: Mobility/safety with mobility;Balance;Proper use of DME        AM-PAC PT "6 Clicks" Mobility   Outcome Measure  Help needed turning from your back to your side while in a flat bed without using bedrails?: A Little Help needed moving from lying on your back to sitting on the side of a flat bed without using bedrails?: A Lot Help needed moving to and from a bed to a chair (including a wheelchair)?: A Lot Help needed standing up from a chair using your arms (e.g., wheelchair or bedside chair)?: A Lot Help needed to walk in hospital room?: A Lot Help needed climbing 3-5 steps with a railing? : Total 6 Click Score: 12    End of Session Equipment Utilized During Treatment: Gait belt Activity Tolerance: Patient tolerated treatment well Patient left: with call bell/phone within reach;in chair;with chair alarm set Nurse Communication: Mobility status PT Visit Diagnosis: Other abnormalities of gait and mobility (R26.89);Muscle weakness (generalized) (M62.81);Difficulty in walking, not elsewhere classified (R26.2)     Time: 3500-9381 PT Time Calculation (min) (ACUTE ONLY): 31 min  Charges:  $Gait Training: 8-22 mins                     Earney Navy, PTA Acute Rehabilitation Services Pager: [phone] Office: [phone]     Darliss Cheney 03/19/2019, 11:21 AM

## 2019-03-19 NOTE — Progress Notes (Signed)
Occupational Therapy Treatment Patient Details Name: John Blackburn MRN: [id number] DOB: May 23, 1946 Today's Date: 03/19/2019    History of present illness Patient is a 73 year old male. Per notes sons report that over the past 2 weeks the patient has had increasing weakness. He is confused today. 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 PMH: PE, sleep apnea, skin cancer, MI, depression, GERD, HTN, allergies, OA, bilateral knee replacements    OT comments  Engaged pt in self-care tasks at sink to address increased standing tolerance and sequencing during familiar tasks.  Pt demonstrated fluctuations in ability to complete sit <> stand requiring as little as min A to max A +2.  Pt demonstrated increased activity tolerance to engage in grooming tasks in standing at sink.  Pt required seated rest break while completing oral care, but returned to standing to complete task.  Pt will continue to benefit from skilled OT acutely to prepare for d/c to next venue of care.  Follow Up Recommendations  SNF;Supervision/Assistance - 24 hour    Equipment Recommendations  Other (comment)(TBD at next venue)       Precautions / Restrictions Precautions Precautions: Fall Restrictions Weight Bearing Restrictions: No       Mobility Bed Mobility Overal bed mobility: Needs Assistance Bed Mobility: Supine to Sit     Supine to sit: HOB elevated;Min guard     General bed mobility comments: pt requires multimodal cues for sequencing and hand over hand assist for reach toward bed rail; min guard for safety and increased time and effort needed  Transfers Overall transfer level: Needs assistance Equipment used: Rolling walker (2 wheeled) Transfers: Sit to/from Stand Sit to Stand: From elevated surface;Mod assist;+2 physical assistance;Min assist;Max assist         General transfer comment: pt stood 3-4 times during session; cues for safe hand placement; pt required varying levels of assistance  throughout session with max A +2 for initial stand from EOB and min A +2 to stand from [facility] and then when fatigued mod A +2 to stand from [facility]    Balance Overall balance assessment: Needs assistance Sitting-balance support: Feet supported;Bilateral upper extremity supported Sitting balance-Leahy Scale: Poor   Postural control: Posterior lean;Left lateral lean Standing balance support: Bilateral upper extremity supported Standing balance-Leahy Scale: Poor                             ADL either performed or assessed with clinical judgement   ADL Overall ADL's : Needs assistance/impaired     Grooming: Min guard;Wash/dry hands;Oral care;Wash/dry face;Standing Grooming Details (indicate cue type and reason): with chair behind pt in case.  Pt required seated rest break while brushing teeth, returned to standing to spit and rinse mouth         Upper Body Dressing : Minimal assistance Upper Body Dressing Details (indicate cue type and reason): hospital gown     Toilet Transfer: Moderate assistance;+2 for physical assistance;RW;Ambulation Toilet Transfer Details (indicate cue type and reason): +2 for sit > stand from elevated EOB, but once upright +1 for mobility Toileting- Clothing Manipulation and Hygiene: Total assistance;Sit to/from stand       Functional mobility during ADLs: Minimal assistance;+2 for physical assistance;Cueing for sequencing;Rolling walker;Cueing for safety General ADL Comments: Requires up to +2 for sit > stand depending on endurance and UE support     Vision Patient Visual Report: No change from baseline  Cognition Arousal/Alertness: Awake/alert Behavior During Therapy: WFL for tasks assessed/performed Overall Cognitive Status: Impaired/Different from baseline Area of Impairment: Memory;Safety/judgement;Following commands;Problem solving                 Orientation Level: Disoriented to;Situation;Time Current Attention  Level: Sustained Memory: Decreased short-term memory Following Commands: Follows one step commands with increased time;Follows one step commands consistently Safety/Judgement: Decreased awareness of safety;Decreased awareness of deficits   Problem Solving: Difficulty sequencing;Decreased initiation;Slow processing;Requires verbal cues;Requires tactile cues                     Pertinent Vitals/ Pain       Pain Assessment: No/denies pain         Frequency  Min 2X/week        Progress Toward Goals  OT Goals(current goals can now be found in the care plan section)  Progress towards OT goals: Progressing toward goals  Acute Rehab OT Goals Patient Stated Goal: none stated OT Goal Formulation: Patient unable to participate in goal setting Potential to Achieve Goals: [place] Discharge plan remains appropriate    Co-evaluation    PT/OT/SLP Co-Evaluation/Treatment: Yes Reason for Co-Treatment: Necessary to address cognition/behavior during functional activity;For patient/therapist safety;To address functional/ADL transfers PT goals addressed during session: Mobility/safety with mobility;Balance;Proper use of DME OT goals addressed during session: ADL's and self-care;Proper use of Adaptive equipment and DME      AM-PAC OT "6 Clicks" Daily Activity     Outcome Measure   Help from another person eating meals?: A Little Help from another person taking care of personal grooming?: A Little Help from another person toileting, which includes using toliet, bedpan, or urinal?: A Lot Help from another person bathing (including washing, rinsing, drying)?: A Lot Help from another person to put on and taking off regular upper body clothing?: A Little Help from another person to put on and taking off regular lower body clothing?: A Lot 6 Click Score: 15    End of Session Equipment Utilized During Treatment: Gait belt;Rolling walker  OT Visit Diagnosis: Unsteadiness on feet  (R26.81);Other abnormalities of gait and mobility (R26.89);Muscle weakness (generalized) (M62.81);Other symptoms and signs involving cognitive function   Activity Tolerance Patient tolerated treatment well   Patient Left in chair;with call bell/phone within reach;with chair alarm set   Nurse Communication Mobility status        Time: 4627-0350 OT Time Calculation (min): 31 min  Charges: OT General Charges $OT Visit: 1 Visit OT Treatments $Self Care/Home Management : 8-22 mins    Simonne Come, [phone] 03/19/2019, 11:36 AM

## 2019-03-19 NOTE — Progress Notes (Signed)
PROGRESS NOTE    John Blackburn  CNO:[id number] DOB: 14-Sep-1945 DOA: 03/03/2019 PCP: Hoyt Koch, MD   Brief Narrative: RABON Blackburn is a 73 y.o. medical history significant ofhypertension, hyperlipidemia, GERD, depression, CAD, stent placement, PE 2014 (not onanticoagulants currently),OSA on CPAP,dCHF. Patient presented with generalized weakness.   Assessment & Plan:   Principal Problem:   Generalized weakness Active Problems:   Hypertension   Hyperlipidemia   Obstructive sleep apnea   Chronic diastolic (congestive) heart failure (HCC)   GERD (gastroesophageal reflux disease)   Memory loss   Hypokalemia   Weakness generalized   CAD (coronary artery disease)   Generalized weakness Initial concern for stroke, with a negative MRI. TSH normal. Patient unsafe for discharge home secondary to inability to perform ADLs and he lives alone. Not appropriate for ALF secondary to inability to transfer on his own. -SNF pending availability/insurance  Aspiration pneumonia Urinary tract infection Will treat as both. Peak WBC of 32.1k, now significantly improved. Procalcitonin with initial increase, now trending down. Urine culture significant for multiple species. Blood culture with no growth. Repeat Urine culture (7/12) with insignificant growth -Unasyn, plan to treat for 7 days of antibiotics  AKI Unsure of etiology. Mild. Possibly secondary to infection. Improving.  Essential hypertension Well controlled -Continue amlodipine  Dysphagia -Dysphagia 1 diet (puree), nectar thickened liquid per SLP recommendations  Obstructive sleep apnea -Continue CPAP qhs  Chronic diastolic heart failure Stable.  GERD -Continue Protonix  Memory loss Patient is on Namenda  Depression/anxiety Continue Lexapro and Wellbutrin XL  DVT prophylaxis: Lovenox Code Status:   Code Status: Full Code Family Communication: None Disposition Plan: Medically ready for discharge.  Unsafe discharge at this time. Unable to discharge to SNF secondary to insurance denial. Plan changed to ALF discharge, but patient not appropriate secondary to level of assist needed. Tuberculin test negative  Obesity Body mass index is 30.59 kg/m.   Consultants:   None  Procedures:   None  Antimicrobials:  Unasyn (7/10>>   Subjective: No concerns  Objective: Vitals:   03/19/19 0023 03/19/19 0335 03/19/19 0743 03/19/19 1222  BP: 123/80 134/80 127/76 123/84  Pulse: 74 80 80 74  Resp: 18 18 18 18   Temp: 98.6 F (37 C) 98.2 F (36.8 C) 98.1 F (36.7 C) 98 F (36.7 C)  TempSrc: Oral Oral Oral Oral  SpO2: 96% 95% 97% 99%  Weight:      Height:        Intake/Output Summary (Last 24 hours) at 03/19/2019 1328 Last data filed at 03/19/2019 1100 Gross per 24 hour  Intake 760 ml  Output 1800 ml  Net -1040 ml   Filed Weights   03/03/19 2154 03/04/19 0657  Weight: 100.7 kg 102.3 kg    Examination:  General exam: Appears calm and comfortable Respiratory system: Diminished to auscultation. Respiratory effort normal. Cardiovascular system: S1 & S2 heard, RRR. No murmurs, rubs, gallops or clicks. Gastrointestinal system: Abdomen is nondistended, soft and nontender. No organomegaly or masses felt. Normal bowel sounds heard. Central nervous system: Alert and oriented to person and city. Extremities: No edema. No calf tenderness Skin: No cyanosis. No rashes Psychiatry: Judgement and insight appear impaired. Blunt affect    Data Reviewed: I have personally reviewed following labs and imaging studies  CBC: Recent Labs  Lab 03/15/19 1203 03/16/19 0457 03/17/19 0503  WBC 32.1* 22.3* 7.3  HGB 15.3 14.5 13.4  HCT 45.1 43.0 41.7  MCV 98.0 98.4 101.7*  PLT 318 284 218  Basic Metabolic Panel: Recent Labs  Lab 03/16/19 0457 03/17/19 0503  NA 139 142  K 4.0 4.0  CL 101 108  CO2 26 26  GLUCOSE 123* 145*  BUN 28* 24*  CREATININE 1.32* 1.15  CALCIUM 9.1 8.7*    GFR: Estimated Creatinine Clearance: 70.8 mL/min (by C-G formula based on SCr of 1.15 mg/dL). Liver Function Tests: No results for input(s): AST, ALT, ALKPHOS, BILITOT, PROT, ALBUMIN in the last 168 hours. No results for input(s): LIPASE, AMYLASE in the last 168 hours. No results for input(s): AMMONIA in the last 168 hours. Coagulation Profile: No results for input(s): INR, PROTIME in the last 168 hours. Cardiac Enzymes: No results for input(s): CKTOTAL, CKMB, CKMBINDEX, TROPONINI in the last 168 hours. BNP (last 3 results) No results for input(s): PROBNP in the last 8760 hours. HbA1C: No results for input(s): HGBA1C in the last 72 hours. CBG: No results for input(s): GLUCAP in the last 168 hours. Lipid Profile: No results for input(s): CHOL, HDL, LDLCALC, TRIG, CHOLHDL, LDLDIRECT in the last 72 hours. Thyroid Function Tests: No results for input(s): TSH, T4TOTAL, FREET4, T3FREE, THYROIDAB in the last 72 hours. Anemia Panel: No results for input(s): VITAMINB12, FOLATE, FERRITIN, TIBC, IRON, RETICCTPCT in the last 72 hours. Sepsis Labs: Recent Labs  Lab 03/15/19 1203 03/16/19 0457 03/17/19 0503  PROCALCITON 0.48 0.73 0.42    Recent Results (from the past 240 hour(s))  Novel Coronavirus, NAA (hospital order; send-out to ref lab)     Status: None   Collection Time: 03/11/19  1:42 PM   Specimen: Nasopharyngeal Swab; Respiratory  Result Value Ref Range Status   SARS-CoV-2, NAA NOT DETECTED NOT DETECTED Final    Comment: (NOTE) This test was developed and its performance characteristics determined by [employer]. This test has not been FDA cleared or approved. This test has been authorized by FDA under an Emergency Use Authorization (EUA). This test is only authorized for the duration of time the declaration that circumstances exist justifying the authorization of the emergency use of in vitro diagnostic tests for detection of SARS-CoV-2 virus and/or diagnosis of  COVID-19 infection under section 564(b)(1) of the Act, 21 U.S.C. 443XVQ-0(G)(8), unless the authorization is terminated or revoked sooner. When diagnostic testing is negative, the possibility of a false negative result should be considered in the context of a patient's recent exposures and the presence of clinical signs and symptoms consistent with COVID-19. An individual without symptoms of COVID-19 and who is not shedding SARS-CoV-2 virus would expect to have a negative (not detected) result in this assay. Performed  At: [facility] [address] [place], [place] [id number] Rush Farmer MD OI:[id number]    [place]  Final    Comment: Performed at [facility], [place] [address], [place], [place] [address]  Culture, blood (routine x 2)     Status: None (Preliminary result)   Collection Time: 03/15/19  6:35 PM   Specimen: BLOOD  Result Value Ref Range Status   Specimen Description BLOOD LEFT ANTECUBITAL  Final   Special Requests   Final    BOTTLES DRAWN AEROBIC AND ANAEROBIC Blood Culture results may not be optimal due to an excessive volume of blood received in culture bottles   Culture   Final    NO GROWTH 3 DAYS Performed at [facility], [place] [address], [place], [place] [address]    Report Status PENDING  Incomplete  Culture, blood (routine x 2)     Status:  None (Preliminary result)   Collection Time: 03/15/19  6:59 PM   Specimen: BLOOD RIGHT HAND  Result Value Ref Range Status   Specimen Description BLOOD RIGHT HAND  Final   Special Requests AEROBIC BOTTLE ONLY Blood Culture adequate volume  Final   Culture   Final    NO GROWTH 3 DAYS Performed at [facility], [place] [address] [place], [place] [address]    Report Status PENDING  Incomplete  Culture, Urine     Status: Abnormal   Collection Time: 03/15/19 10:48 PM   Specimen: Urine, Clean Catch  Result Value Ref Range Status   Specimen Description URINE, CLEAN CATCH   Final   Special Requests   Final    NONE Performed at [facility], [place] [address], [place], [place] [address]    Culture (A)  Final    >=100,000 COLONIES/mL MULTIPLE SPECIES PRESENT, SUGGEST RECOLLECTION   Report Status 03/17/2019 FINAL  Final  Culture, Urine     Status: Abnormal   Collection Time: 03/17/19 10:44 AM   Specimen: Urine, Random  Result Value Ref Range Status   Specimen Description URINE, RANDOM  Final   Special Requests NONE  Final   Culture (A)  Final    <10,000 COLONIES/mL INSIGNIFICANT GROWTH Performed at [facility], [place] [address], [place], [place] [address]    Report Status 03/18/2019 FINAL  Final         Radiology Studies: Dg Swallowing Func-speech Pathology  Result Date: 03/19/2019 Objective Swallowing Evaluation: Type of Study: MBS-Modified Barium Swallow Study  Patient Details Name: KANON NOVOSEL MRN: [id number] Date of Birth: 10-07-1945 Today's Date: 03/19/2019 Time: SLP Start Time (ACUTE ONLY): 1032 -SLP Stop Time (ACUTE ONLY): 1050 SLP Time Calculation (min) (ACUTE ONLY): 18 min Past Medical History: Past Medical History: Diagnosis Date  Allergy   Arthritis   knees,but better after TKR bilateral  CAD (coronary artery disease)   08/2017 PCI/DES mRCA, RPDA, CTO pf pLCX with collaterals, normal EF  Depression   on multiple meds. Has been seen at [facility]. and Dr. Sabra Heck is his prescriber  Diverticulitis of colon 2000 's  treated as an outpatient  GERD (gastroesophageal reflux disease)   UGI done August '12 - ulcer/. Dr. Ferdinand Lango, gastroenterologist in [facility].   Hx of adenomatous colonic polyps   Hyperlipidemia   last lipid panel: HDL 44, LDL 117  Hypertension   Myocardial infarction ([place])   Peptic ulcer   in the past and just recently-August '12  Pulmonary emboli ([place])   noted October 2014 - treated by Dr. Linda Hedges  skin cancer   skin CA  Sleep apnea, primary central   wears CPAP Past Surgical History: Past Surgical History:  Procedure Laterality Date  ANAL RECTAL MANOMETRY N/A 11/30/2016  Procedure: ANO RECTAL MANOMETRY;  Surgeon: Mauri Pole, MD;  Location: [facility];  Service: Endoscopy;  Laterality: N/A;  CARDIAC CATHETERIZATION    CHOLECYSTECTOMY  1990  laproscopic   CORONARY STENT INTERVENTION N/A 09/01/2017  Procedure: CORONARY STENT INTERVENTION;  Surgeon: Jettie Booze, MD;  Location: [facility];  Service: Cardiovascular;  Laterality: N/A;  HERNIA REPAIR    JOINT REPLACEMENT    knee  PROSTATE SURGERY    needle biopsy's, TURP  RIGHT/LEFT HEART CATH AND CORONARY ANGIOGRAPHY N/A 09/01/2017  Procedure: RIGHT/LEFT HEART CATH AND CORONARY ANGIOGRAPHY;  Surgeon: Jettie Booze, MD;  Location: [place] [facility];  Service: Cardiovascular;  Laterality: N/A;  TKR bilateral  2006  Dr. Violet Baldy  UPPER GASTROINTESTINAL ENDOSCOPY    VASECTOMY   HPI: Patient is a 73 year old male. Per notes sons report that over the past 2 weeks the patient has had increasing weakness. He is confused today. 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 PMH: PE, sleep apnea, skin cancer, MI, depression, GERD, HTN, allergies, OA, bilateral knee replacements  Subjective: pleasant, low vocal intensity and hoarse voice, condom cath has come off and patient has urinated on self Assessment / Plan / Recommendation CHL IP CLINICAL IMPRESSIONS 03/19/2019 Clinical Impression Pt's oropharyngeal range of motion and strength noted to be functional although residue in valleculae ranged from none to moderate (following solid). While swallowing barium pill with thin there was flash penetration of thin that is common likely due to coordination during with dual consistencies. Residue was cleared with sips thin. Minimal and inconsistent instances of barium in cervical esophagus area. Pill hesitated in esophagus before transiting with sips thin. Recommend thin liquids, Dys 3 texture, no straws and pills whole in applesauce. Speech Pathology  will continue to follow.      SLP Visit Diagnosis Dysphagia, pharyngeal phase (R13.13) Attention and concentration deficit following -- Frontal lobe and executive function deficit following -- Impact on safety and function Mild aspiration risk   CHL IP TREATMENT RECOMMENDATION 03/19/2019 Treatment Recommendations Therapy as outlined in treatment plan below   Prognosis 03/19/2019 Prognosis for Safe Diet Advancement Good Barriers to Reach Goals Cognitive deficits Barriers/Prognosis Comment -- CHL IP DIET RECOMMENDATION 03/19/2019 SLP Diet Recommendations Dysphagia 3 (Mech soft) solids;Thin liquid Liquid Administration via Cup;Straw Medication Administration Whole meds with puree Compensations Small sips/bites;Slow rate Postural Changes Seated upright at 90 degrees   CHL IP OTHER RECOMMENDATIONS 03/19/2019 Recommended Consults -- Oral Care Recommendations Oral care BID Other Recommendations --   CHL IP FOLLOW UP RECOMMENDATIONS 03/19/2019 Follow up Recommendations 24 hour supervision/assistance   CHL IP FREQUENCY AND DURATION 03/19/2019 Speech Therapy Frequency (ACUTE ONLY) min 2x/week Treatment Duration 2 weeks      CHL IP ORAL PHASE 03/19/2019 Oral Phase WFL Oral - Pudding Teaspoon -- Oral - Pudding Cup -- Oral - Honey Teaspoon -- Oral - Honey Cup -- Oral - Nectar Teaspoon -- Oral - Nectar Cup -- Oral - Nectar Straw -- Oral - Thin Teaspoon -- Oral - Thin Cup -- Oral - Thin Straw -- Oral - Puree -- Oral - Mech Soft -- Oral - Regular -- Oral - Multi-Consistency -- Oral - Pill -- Oral Phase - Comment --  CHL IP PHARYNGEAL PHASE 03/19/2019 Pharyngeal Phase Impaired Pharyngeal- Pudding Teaspoon -- Pharyngeal -- Pharyngeal- Pudding Cup -- Pharyngeal -- Pharyngeal- Honey Teaspoon -- Pharyngeal -- Pharyngeal- Honey Cup -- Pharyngeal -- Pharyngeal- Nectar Teaspoon -- Pharyngeal -- Pharyngeal- Nectar Cup WFL Pharyngeal -- Pharyngeal- Nectar Straw -- Pharyngeal -- Pharyngeal- Thin Teaspoon -- Pharyngeal -- Pharyngeal- Thin Cup  Penetration/Aspiration during swallow Pharyngeal Material enters airway, remains ABOVE vocal cords then ejected out Pharyngeal- Thin Straw Pharyngeal residue - valleculae Pharyngeal -- Pharyngeal- Puree -- Pharyngeal -- Pharyngeal- Mechanical Soft -- Pharyngeal -- Pharyngeal- Regular Pharyngeal residue - valleculae Pharyngeal -- Pharyngeal- Multi-consistency -- Pharyngeal -- Pharyngeal- Pill -- Pharyngeal -- Pharyngeal Comment --  CHL IP CERVICAL ESOPHAGEAL PHASE 03/19/2019 Cervical Esophageal Phase (No Data) Pudding Teaspoon -- Pudding Cup -- Honey Teaspoon -- Honey Cup -- Nectar Teaspoon -- Nectar Cup -- Nectar Straw -- Thin Teaspoon -- Thin Cup -- Thin Straw -- Puree -- Mechanical Soft -- Regular -- Multi-consistency --  Pill -- Cervical Esophageal Comment -- [place] Siren 03/19/2019, 1:01 PM Orbie Pyo Colvin Caroli.Ed Actor Pager [phone] Office [phone]                   Scheduled Meds:  amLODipine  5 mg Oral Daily   aspirin  81 mg Oral Daily   clopidogrel  75 mg Oral Daily   enoxaparin (LOVENOX) injection  40 mg Subcutaneous Q24H   memantine  7 mg Oral Daily   pantoprazole  40 mg Oral Daily   Continuous Infusions:  ampicillin-sulbactam (UNASYN) IV 3 g (03/19/19 1223)     LOS: 15 days     Cordelia Poche, MD [facility] 03/19/2019, 1:28 PM  If 7PM-7AM, please contact night-coverage [email or web address]

## 2019-03-20 LAB — CULTURE, BLOOD (ROUTINE X 2)
Culture: NO GROWTH
Culture: NO GROWTH
Special Requests: ADEQUATE

## 2019-03-20 LAB — BASIC METABOLIC PANEL
Anion gap: 10 (ref 5–15)
BUN: 13 mg/dL (ref 8–23)
CO2: 26 mmol/L (ref 22–32)
Calcium: 8.9 mg/dL (ref 8.9–10.3)
Chloride: 104 mmol/L (ref 98–111)
Creatinine, Ser: 0.83 mg/dL (ref 0.61–1.24)
GFR calc Af Amer: >60 mL/min (ref >60)
GFR calc non Af Amer: >60 mL/min (ref >60)
Glucose, Bld: 120 mg/dL — ABNORMAL HIGH (ref 70–99)
Potassium: 3.6 mmol/L (ref 3.5–5.1)
Sodium: 140 mmol/L (ref 135–145)

## 2019-03-20 MED ORDER — AMOXICILLIN-POT CLAVULANATE 875-125 MG PO TABS
1.0000 | ORAL_TABLET | Freq: Two times a day (BID) | ORAL | Status: DC
  Administered 2019-03-20 – 2019-03-21 (×2): 1 via ORAL
  Filled 2019-03-20 (×2): qty 1

## 2019-03-20 NOTE — Progress Notes (Signed)
Physical Therapy Treatment Patient Details Name: John Blackburn MRN: [id number] DOB: 25-Apr-1946 Today's Date: 03/20/2019    History of Present Illness Patient is a 73 year old male. Per notes sons report that over the past 2 weeks the patient has had increasing weakness. He is confused today. 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 PMH: PE, sleep apnea, skin cancer, MI, depression, GERD, HTN, allergies, OA, bilateral knee replacements     PT Comments    Patient seen for mobility progression. Pt is pleasant and agreeable to participate in therapy. Pt is making progress toward PT goals and ambulated 50 ft with RW and min/mod A. This session pt follows single step commands with increased time. Continue to progress as tolerated.    Follow Up Recommendations  SNF     Equipment Recommendations  None recommended by PT    Recommendations for Other Services       Precautions / Restrictions Precautions Precautions: Fall Restrictions Weight Bearing Restrictions: No    Mobility  Bed Mobility Overal bed mobility: Needs Assistance Bed Mobility: Supine to Sit     Supine to sit: HOB elevated;Min guard     General bed mobility comments: min guard for safety; use of rail and increased time and effort needed  Transfers Overall transfer level: Needs assistance Equipment used: Rolling walker (2 wheeled) Transfers: Sit to/from Stand Sit to Stand: Mod assist;From elevated surface         General transfer comment: cues for safe hand placement; assist to power up into standing   Ambulation/Gait Ambulation/Gait assistance: Min assist;+2 safety/equipment;Mod assist Gait Distance (Feet): 50 Feet Assistive device: Rolling walker (2 wheeled) Gait Pattern/deviations: Step-through pattern;Trunk flexed;Decreased step length - right;Decreased step length - left;Decreased dorsiflexion - right;Decreased dorsiflexion - left Gait velocity: decreased   General Gait Details: cues for upright  posture, safe proximity to RW, and increased bilat step lengths; assist to steady and guide RW; grossly min A required   Stairs             Wheelchair Mobility    Modified Rankin (Stroke Patients Only) Modified Rankin (Stroke Patients Only) Pre-Morbid Rankin Score: Slight disability Modified Rankin: Moderately severe disability     Balance Overall balance assessment: Needs assistance Sitting-balance support: Feet supported;Bilateral upper extremity supported Sitting balance-Leahy Scale: Poor     Standing balance support: Bilateral upper extremity supported Standing balance-Leahy Scale: Poor                              Cognition Arousal/Alertness: Awake/alert Behavior During Therapy: WFL for tasks assessed/performed Overall Cognitive Status: History of cognitive impairments - at baseline                               Problem Solving: Difficulty sequencing;Decreased initiation;Slow processing;Requires verbal cues;Requires tactile cues General Comments: needs increased time to follow commands      Exercises      General Comments        Pertinent Vitals/Pain Pain Assessment: No/denies pain    Home Living                      Prior Function            PT Goals (current goals can now be found in the care plan section) Progress towards PT goals: Progressing toward goals    Frequency    Min  4X/week      PT Plan Current plan remains appropriate    Co-evaluation              AM-PAC PT "6 Clicks" Mobility   Outcome Measure  Help needed turning from your back to your side while in a flat bed without using bedrails?: A Little Help needed moving from lying on your back to sitting on the side of a flat bed without using bedrails?: A Little Help needed moving to and from a bed to a chair (including a wheelchair)?: A Little Help needed standing up from a chair using your arms (e.g., wheelchair or bedside chair)?: A  Lot Help needed to walk in hospital room?: A Lot Help needed climbing 3-5 steps with a railing? : Total 6 Click Score: 14    End of Session Equipment Utilized During Treatment: Gait belt Activity Tolerance: Patient tolerated treatment well Patient left: with call bell/phone within reach;in chair;with chair alarm set Nurse Communication: Mobility status PT Visit Diagnosis: Other abnormalities of gait and mobility (R26.89);Muscle weakness (generalized) (M62.81);Difficulty in walking, not elsewhere classified (R26.2)     Time: 0940-1005 PT Time Calculation (min) (ACUTE ONLY): 25 min  Charges:  $Gait Training: 23-37 mins                     Earney Navy, PTA Acute Rehabilitation Services Pager: [phone] Office: [phone]     Darliss Cheney 03/20/2019, 11:36 AM

## 2019-03-20 NOTE — Progress Notes (Signed)
  Speech Language Pathology Treatment: Dysphagia  Patient Details Name: John Blackburn MRN: [id number] DOB: [birth year] Today's Date: 03/20/2019 Time: 4650-3546 SLP Time Calculation (min) (ACUTE ONLY): 13 min  Assessment / Plan / Recommendation Clinical Impression  Pt consumed regular solids and thin liquids with prolonged mastication, perhaps related to poor dentition, but cognitive he also is slow to process and respond. He appropriately self-regulated bolus size and rate of intake. Other than a cough that followed his first sip of water, no other overt signs of aspiration were noted. SLP reiterated recommendations from MBS and updated sign at the [facility]. Will continue to follow briefly for tolerance.   HPI HPI: Patient is a 73 year old male. Per notes sons report that over the past 2 weeks the patient has had increasing weakness. He is confused today. 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 PMH: PE, sleep apnea, skin cancer, MI, depression, GERD, HTN, allergies, OA, bilateral knee replacements      SLP Plan  Continue with current plan of care       Recommendations  Diet recommendations: Dysphagia 3 (mechanical soft);Thin liquid Liquids provided via: Cup;Straw Medication Administration: Whole meds with puree Supervision: Patient able to self feed;Staff to assist with self feeding;Intermittent supervision to cue for compensatory strategies Compensations: Small sips/bites;Slow rate Postural Changes and/or Swallow Maneuvers: Seated upright 90 degrees                Oral Care Recommendations: Oral care BID Follow up Recommendations: 24 hour supervision/assistance SLP Visit Diagnosis: Dysphagia, pharyngeal phase (R13.13) Plan: Continue with current plan of care       GO                Venita Sheffield Tereza Gilham 03/20/2019, 2:03 PM   Pollyann Glen, M.A. [place] Acute Environmental education officer [phone] Office [phone]

## 2019-03-20 NOTE — Progress Notes (Signed)
Chaplain rec'd referral for visit from the patient's cousin John Blackburn.  Patient has been here 16 days.  Turned 73 just days ago.  Patient was receptive for a visit but slowly formed thoughts and words.  Pt spoke of being a part of non-denominational church in [place]. The largest he said,  Chaplain identified that he used to work as  Clinical biochemist for [employer].  "The most fulfilling work I ever did" he said.  Patient said he grew up in [place], near his cousin [place]. He repeatedly said she was a good person. Speech therapist came to work with patient. Chaplain will continue to follow. Rev.Tamsen Snider Pager [phone]

## 2019-03-20 NOTE — Progress Notes (Signed)
PROGRESS NOTE    John Blackburn  OZD:[id number] DOB: [birth year] DOA: 03/03/2019 PCP: Hoyt Koch, MD   Brief Narrative:  Patient is a 73 year old male with history of hypertension, hyperlipidemia, GERD, depression,CAD status post stent placement, PE not on anticoagulation, OSA on CPAP, diastolic CHF,memory loss  who presented from home with generalized weakness.  He was suspected to have aspiration pneumonia and is being treated with antibiotics.  Currently waiting for placement.  Assessment & Plan:   Principal Problem:   Generalized weakness Active Problems:   Hypertension   Hyperlipidemia   Obstructive sleep apnea   Chronic diastolic (congestive) heart failure (HCC)   GERD (gastroesophageal reflux disease)   Memory loss   Hypokalemia   Weakness generalized   CAD (coronary artery disease)   Generalized weakness: Initial concern for stroke.  MRI negative for stroke.  TSH normal.  Patient evaluated by physical therapy and recommended skilled nursing facility.  Patient unable to perform ADLs and lives alone.  SNF placement pending.  Case manager/social worker following.  Aspiration pneumonia/urinary tract infection: Presented with leukocytosis in the range of  30,000.  Elevated procalcitonin.Now significantly improved.    Urine culture showed multiple species.  Blood cultures did not show any growth.  CXR showed suspected increasing opacity in the left retrocardiac region representing  atelectasis or infiltrate.Currently on Unasyn.  Speech therapy evaluated him and recommended dysphagia 3 diet.  Acute kidney injury: Resolved  Essential hypertension: Currently blood pressure well controlled.  Continue current meds  OSA: Continue CPAP  Chronic diastolic CHF: Currently euvolemic.  GERD: Continue protonix  Memory loss: On Namenda.  His manager working on placement on memory care.  Depression/anxiety: On Lexapro, Wellbutrin  Patient is medically stable for discharge  to ALF/skilled nursing facility soon as bed is available          DVT prophylaxis: Lovenox Code Status: Full Family Communication: None Disposition Plan: Waiting for placement.  Most likely ALF.  Case manager working   Consultants: None  Procedures: None  Antimicrobials:  Anti-infectives (From admission, onward)   Start     Dose/Rate Route Frequency Ordered Stop   03/15/19 2200  Ampicillin-Sulbactam (UNASYN) 3 g in sodium chloride 0.9 % 100 mL IVPB     3 g 200 mL/hr over 30 Minutes Intravenous Every 6 hours 03/15/19 2057        Subjective:  Patient seen and examined the bedside this morning.  Currently hemodynamically stable.  Alert and oriented.  Still looks weak.  Mostly bedbound.  Denies any new complaints.  Objective: Vitals:   03/19/19 1926 03/20/19 0048 03/20/19 0616 03/20/19 0855  BP: (!) 151/87 132/87 132/88 140/88  Pulse: 74 79 79 73  Resp: 18 18 18 18   Temp: 97.7 F (36.5 C) 97.7 F (36.5 C) 97.8 F (36.6 C) 97.6 F (36.4 C)  TempSrc: Oral Oral Oral Oral  SpO2: 100% 93% 97% 99%  Weight:      Height:        Intake/Output Summary (Last 24 hours) at 03/20/2019 0948 Last data filed at 03/20/2019 0900 Gross per 24 hour  Intake 960 ml  Output 1650 ml  Net -690 ml   Filed Weights   03/03/19 2154 03/04/19 0657  Weight: 100.7 kg 102.3 kg    Examination:  General exam: weak,debilitated HEENT:PERRL,Oral mucosa moist, Ear/Nose normal on gross exam Respiratory system: Bilateral equal air entry, normal vesicular breath sounds, no wheezes or crackles  Cardiovascular system: S1 & S2 heard, RRR. No JVD, murmurs,  rubs, gallops or clicks. No pedal edema. Gastrointestinal system: Abdomen is nondistended, soft and nontender. No organomegaly or masses felt. Normal bowel sounds heard. Central nervous system: Alert and oriented to place and person. No focal neurological deficits.  Judgment/insight impaired. Extremities: No edema, no clubbing ,no cyanosis, distal  peripheral pulses palpable. Skin: No rashes, lesions or ulcers,no icterus ,no pallor    Data Reviewed: I have personally reviewed following labs and imaging studies  CBC: Recent Labs  Lab 03/15/19 1203 03/16/19 0457 03/17/19 0503  WBC 32.1* 22.3* 7.3  HGB 15.3 14.5 13.4  HCT 45.1 43.0 41.7  MCV 98.0 98.4 101.7*  PLT 318 284 425   Basic Metabolic Panel: Recent Labs  Lab 03/16/19 0457 03/17/19 0503 03/20/19 0459  NA 139 142 140  K 4.0 4.0 3.6  CL 101 108 104  CO2 26 26 26   GLUCOSE 123* 145* 120*  BUN 28* 24* 13  CREATININE 1.32* 1.15 0.83  CALCIUM 9.1 8.7* 8.9   GFR: Estimated Creatinine Clearance: 98.1 mL/min (by C-G formula based on SCr of 0.83 mg/dL). Liver Function Tests: No results for input(s): AST, ALT, ALKPHOS, BILITOT, PROT, ALBUMIN in the last 168 hours. No results for input(s): LIPASE, AMYLASE in the last 168 hours. No results for input(s): AMMONIA in the last 168 hours. Coagulation Profile: No results for input(s): INR, PROTIME in the last 168 hours. Cardiac Enzymes: No results for input(s): CKTOTAL, CKMB, CKMBINDEX, TROPONINI in the last 168 hours. BNP (last 3 results) No results for input(s): PROBNP in the last 8760 hours. HbA1C: No results for input(s): HGBA1C in the last 72 hours. CBG: No results for input(s): GLUCAP in the last 168 hours. Lipid Profile: No results for input(s): CHOL, HDL, LDLCALC, TRIG, CHOLHDL, LDLDIRECT in the last 72 hours. Thyroid Function Tests: No results for input(s): TSH, T4TOTAL, FREET4, T3FREE, THYROIDAB in the last 72 hours. Anemia Panel: No results for input(s): VITAMINB12, FOLATE, FERRITIN, TIBC, IRON, RETICCTPCT in the last 72 hours. Sepsis Labs: Recent Labs  Lab 03/15/19 1203 03/16/19 0457 03/17/19 0503  PROCALCITON 0.48 0.73 0.42    Recent Results (from the past 240 hour(s))  Novel Coronavirus, NAA (hospital order; send-out to ref lab)     Status: None   Collection Time: 03/11/19  1:42 PM   Specimen:  Nasopharyngeal Swab; Respiratory  Result Value Ref Range Status   SARS-CoV-2, NAA NOT DETECTED NOT DETECTED Final    Comment: (NOTE) This test was developed and its performance characteristics determined by [employer]. This test has not been FDA cleared or approved. This test has been authorized by FDA under an Emergency Use Authorization (EUA). This test is only authorized for the duration of time the declaration that circumstances exist justifying the authorization of the emergency use of in vitro diagnostic tests for detection of SARS-CoV-2 virus and/or diagnosis of COVID-19 infection under section 564(b)(1) of the Act, 21 U.S.C. 956LOV-5(I)(4), unless the authorization is terminated or revoked sooner. When diagnostic testing is negative, the possibility of a false negative result should be considered in the context of a patient's recent exposures and the presence of clinical signs and symptoms consistent with COVID-19. An individual without symptoms of COVID-19 and who is not shedding SARS-CoV-2 virus would expect to have a negative (not detected) result in this assay. Performed  At: [facility] [address] [place], [place] [id number] Rush Farmer MD [id number]    [place]  Final    Comment: Performed at [facility], [place]  [address] [place], [place] [address]  Culture, blood (routine x 2)     Status: None (Preliminary result)   Collection Time: 03/15/19  6:35 PM   Specimen: BLOOD  Result Value Ref Range Status   Specimen Description BLOOD LEFT ANTECUBITAL  Final   Special Requests   Final    BOTTLES DRAWN AEROBIC AND ANAEROBIC Blood Culture results may not be optimal due to an excessive volume of blood received in culture bottles   Culture   Final    NO GROWTH 4 DAYS Performed at [facility], [place] [address], [place], [place] [address]    Report Status PENDING  Incomplete  Culture, blood (routine x 2)      Status: None (Preliminary result)   Collection Time: 03/15/19  6:59 PM   Specimen: BLOOD RIGHT HAND  Result Value Ref Range Status   Specimen Description BLOOD RIGHT HAND  Final   Special Requests AEROBIC BOTTLE ONLY Blood Culture adequate volume  Final   Culture   Final    NO GROWTH 4 DAYS Performed at [facility], [place] [address], [place], [place] [address]    Report Status PENDING  Incomplete  Culture, Urine     Status: Abnormal   Collection Time: 03/15/19 10:48 PM   Specimen: Urine, Clean Catch  Result Value Ref Range Status   Specimen Description URINE, CLEAN CATCH  Final   Special Requests   Final    NONE Performed at [facility], [place] [address], [place], [place] [address]    Culture (A)  Final    >=100,000 COLONIES/mL MULTIPLE SPECIES PRESENT, SUGGEST RECOLLECTION   Report Status 03/17/2019 FINAL  Final  Culture, Urine     Status: Abnormal   Collection Time: 03/17/19 10:44 AM   Specimen: Urine, Random  Result Value Ref Range Status   Specimen Description URINE, RANDOM  Final   Special Requests NONE  Final   Culture (A)  Final    <10,000 COLONIES/mL INSIGNIFICANT GROWTH Performed at [facility], [place] [address], [place], [place] [address]    Report Status 03/18/2019 FINAL  Final         Radiology Studies: Dg Swallowing Func-speech Pathology  Result Date: 03/19/2019 Objective Swallowing Evaluation: Type of Study: MBS-Modified Barium Swallow Study  Patient Details Name: John Blackburn MRN: [id number] Date of Birth: 05/07/1946 Today's Date: 03/19/2019 Time: SLP Start Time (ACUTE ONLY): 8916 -SLP Stop Time (ACUTE ONLY): 1050 SLP Time Calculation (min) (ACUTE ONLY): 18 min Past Medical History: Past Medical History: Diagnosis Date . Allergy  . Arthritis   knees,but better after TKR bilateral . CAD (coronary artery disease)   08/2017 PCI/DES mRCA, RPDA, CTO pf pLCX with collaterals, normal EF . Depression   on multiple meds. Has been seen at [facility]. and Dr. Sabra Heck is his prescriber . Diverticulitis of colon 2000 's  treated as an outpatient . GERD (gastroesophageal reflux disease)   UGI done August '12 - ulcer/. Dr. Ferdinand Lango, gastroenterologist in [facility].  [name] Hx of adenomatous colonic polyps  . Hyperlipidemia   last lipid panel: HDL 44, LDL 117 . Hypertension  . Myocardial infarction ([place])  . Peptic ulcer   in the past and just recently-August '12 . Pulmonary emboli [facility])   noted October 2014 - treated by Dr. Linda Hedges . skin cancer   skin CA . Sleep apnea, primary central   wears CPAP Past Surgical History: Past Surgical History: Procedure Laterality Date .  ANAL RECTAL MANOMETRY N/A 11/30/2016  Procedure: ANO RECTAL MANOMETRY;  Surgeon: Mauri Pole, MD;  Location: [facility];  Service: Endoscopy;  Laterality: N/A; . CARDIAC CATHETERIZATION   . CHOLECYSTECTOMY  1990  laproscopic  . CORONARY STENT INTERVENTION N/A 09/01/2017  Procedure: CORONARY STENT INTERVENTION;  Surgeon: Jettie Booze, MD;  Location: [facility] CV LAB;  Service: Cardiovascular;  Laterality: N/A; . HERNIA REPAIR   . JOINT REPLACEMENT    knee . PROSTATE SURGERY    needle biopsy's, TURP . RIGHT/LEFT HEART CATH AND CORONARY ANGIOGRAPHY N/A 09/01/2017  Procedure: RIGHT/LEFT HEART CATH AND CORONARY ANGIOGRAPHY;  Surgeon: Jettie Booze, MD;  Location: [place] CV LAB;  Service: Cardiovascular;  Laterality: N/A; . TKR bilateral  2006  Dr. Violet Baldy . UPPER GASTROINTESTINAL ENDOSCOPY   . VASECTOMY   HPI: Patient is a 73 year old male. Per notes sons report that over the past 2 weeks the patient has had increasing weakness. He is confused today. 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 PMH: PE, sleep apnea, skin cancer, MI, depression, GERD, HTN, allergies, OA, bilateral knee replacements  Subjective: pleasant, low vocal intensity and hoarse voice, condom cath has come off and patient has urinated on self Assessment / Plan / Recommendation CHL IP CLINICAL IMPRESSIONS  03/19/2019 Clinical Impression Pt's oropharyngeal range of motion and strength noted to be functional although residue in valleculae ranged from none to moderate (following solid). While swallowing barium pill with thin there was flash penetration of thin that is common likely due to coordination during with dual consistencies. Residue was cleared with sips thin. Minimal and inconsistent instances of barium in cervical esophagus area. Pill hesitated in esophagus before transiting with sips thin. Recommend thin liquids, Dys 3 texture, no straws and pills whole in applesauce. Speech Pathology will continue to follow.      SLP Visit Diagnosis Dysphagia, pharyngeal phase (R13.13) Attention and concentration deficit following -- Frontal lobe and executive function deficit following -- Impact on safety and function Mild aspiration risk   CHL IP TREATMENT RECOMMENDATION 03/19/2019 Treatment Recommendations Therapy as outlined in treatment plan below   Prognosis 03/19/2019 Prognosis for Safe Diet Advancement Good Barriers to Reach Goals Cognitive deficits Barriers/Prognosis Comment -- CHL IP DIET RECOMMENDATION 03/19/2019 SLP Diet Recommendations Dysphagia 3 (Mech soft) solids;Thin liquid Liquid Administration via Cup;Straw Medication Administration Whole meds with puree Compensations Small sips/bites;Slow rate Postural Changes Seated upright at 90 degrees   CHL IP OTHER RECOMMENDATIONS 03/19/2019 Recommended Consults -- Oral Care Recommendations Oral care BID Other Recommendations --   CHL IP FOLLOW UP RECOMMENDATIONS 03/19/2019 Follow up Recommendations 24 hour supervision/assistance   CHL IP FREQUENCY AND DURATION 03/19/2019 Speech Therapy Frequency (ACUTE ONLY) min 2x/week Treatment Duration 2 weeks      CHL IP ORAL PHASE 03/19/2019 Oral Phase WFL Oral - Pudding Teaspoon -- Oral - Pudding Cup -- Oral - Honey Teaspoon -- Oral - Honey Cup -- Oral - Nectar Teaspoon -- Oral - Nectar Cup -- Oral - Nectar Straw -- Oral - Thin  Teaspoon -- Oral - Thin Cup -- Oral - Thin Straw -- Oral - Puree -- Oral - Mech Soft -- Oral - Regular -- Oral - Multi-Consistency -- Oral - Pill -- Oral Phase - Comment --  CHL IP PHARYNGEAL PHASE 03/19/2019 Pharyngeal Phase Impaired Pharyngeal- Pudding Teaspoon -- Pharyngeal -- Pharyngeal- Pudding Cup -- Pharyngeal -- Pharyngeal- Honey Teaspoon -- Pharyngeal -- Pharyngeal- Honey Cup -- Pharyngeal -- Pharyngeal- Nectar Teaspoon -- Pharyngeal -- Pharyngeal- Nectar  Cup [facility] Pharyngeal -- Pharyngeal- Nectar Straw -- Pharyngeal -- Pharyngeal- Thin Teaspoon -- Pharyngeal -- Pharyngeal- Thin Cup Penetration/Aspiration during swallow Pharyngeal Material enters airway, remains ABOVE vocal cords then ejected out Pharyngeal- Thin Straw Pharyngeal residue - valleculae Pharyngeal -- Pharyngeal- Puree -- Pharyngeal -- Pharyngeal- Mechanical Soft -- Pharyngeal -- Pharyngeal- Regular Pharyngeal residue - valleculae Pharyngeal -- Pharyngeal- Multi-consistency -- Pharyngeal -- Pharyngeal- Pill -- Pharyngeal -- Pharyngeal Comment --  CHL IP CERVICAL ESOPHAGEAL PHASE 03/19/2019 Cervical Esophageal Phase (No Data) Pudding Teaspoon -- Pudding Cup -- Honey Teaspoon -- Honey Cup -- Nectar Teaspoon -- Nectar Cup -- Nectar Straw -- Thin Teaspoon -- Thin Cup -- Thin Straw -- Puree -- Mechanical Soft -- Regular -- Multi-consistency -- Pill -- Cervical Esophageal Comment -- [place] Siren 03/19/2019, 1:01 PM Orbie Pyo Litaker M.Ed Actor Pager [phone] Office [phone]                   Scheduled Meds: . amLODipine  5 mg Oral Daily  . aspirin  81 mg Oral Daily  . clopidogrel  75 mg Oral Daily  . enoxaparin (LOVENOX) injection  40 mg Subcutaneous Q24H  . memantine  7 mg Oral Daily  . pantoprazole  40 mg Oral Daily   Continuous Infusions: . ampicillin-sulbactam (UNASYN) IV 3 g (03/20/19 0848)     LOS: 16 days    Time spent: 35 mins.More than 50% of that time was spent in counseling and/or  coordination of care.      Shelly Coss, MD [facility] Pager [phone]  If 7PM-7AM, please contact night-coverage [email or web address] Password [facility] 03/20/2019, 9:48 AM

## 2019-03-21 LAB — SARS CORONAVIRUS 2 BY RT PCR (HOSPITAL ORDER, PERFORMED IN ~~LOC~~ HOSPITAL LAB): SARS Coronavirus 2: NEGATIVE

## 2019-03-21 MED ORDER — AMLODIPINE BESYLATE 5 MG PO TABS: 5.0000 mg | ORAL_TABLET | Freq: Every day | ORAL | 11 refills | Status: AC

## 2019-03-21 MED ORDER — SENNOSIDES-DOCUSATE SODIUM 8.6-50 MG PO TABS: 1.0000 | ORAL_TABLET | Freq: Two times a day (BID) | ORAL | Status: AC | PRN

## 2019-03-21 NOTE — Plan of Care (Signed)
  Problem: Education: Goal: Knowledge of General Education information will improve Description Including pain rating scale, medication(s)/side effects and non-pharmacologic comfort measures Outcome: Progressing   

## 2019-03-21 NOTE — Progress Notes (Signed)
Patient D/C to snf, [facility].  Report called in to facility (left on hold) left message to call me back.  Patient IV removed, D/C packet is ready.  PT vitals WNL.

## 2019-03-21 NOTE — Plan of Care (Signed)
  Problem: Education: Goal: Knowledge of General Education information will improve Description: Including pain rating scale, medication(s)/side effects and non-pharmacologic comfort measures Outcome: Progressing   Problem: Health Behavior/Discharge Planning: Goal: Ability to manage health-related needs will improve Outcome: Progressing   Problem: Clinical Measurements: Goal: Ability to maintain clinical measurements within normal limits will improve Outcome: Progressing Goal: Will remain free from infection Outcome: Progressing Goal: Respiratory complications will improve Outcome: Progressing Goal: Cardiovascular complication will be avoided Outcome: Progressing   Problem: Activity: Goal: Risk for activity intolerance will decrease Outcome: Progressing   Problem: Nutrition: Goal: Adequate nutrition will be maintained Outcome: Progressing   Problem: Safety: Goal: Ability to remain free from injury will improve Outcome: Progressing   Problem: Skin Integrity: Goal: Risk for impaired skin integrity will decrease Outcome: Progressing   Problem: Education: Goal: Knowledge of disease or condition will improve Outcome: Progressing Goal: Knowledge of secondary prevention will improve Outcome: Progressing Goal: Knowledge of patient specific risk factors addressed and post discharge goals established will improve Outcome: Progressing Goal: Individualized Educational Video(s) Outcome: Progressing   Problem: Coping: Goal: Will verbalize positive feelings about self Outcome: Progressing Goal: Will identify appropriate support needs Outcome: Progressing   Problem: Health Behavior/Discharge Planning: Goal: Ability to manage health-related needs will improve Outcome: Progressing   Problem: Self-Care: Goal: Ability to participate in self-care as condition permits will improve Outcome: Progressing Goal: Verbalization of feelings and concerns over difficulty with self-care will  improve Outcome: Progressing Goal: Ability to communicate needs accurately will improve Outcome: Progressing   Problem: Nutrition: Goal: Risk of aspiration will decrease Outcome: Progressing Goal: Dietary intake will improve Outcome: Progressing   Problem: Ischemic Stroke/TIA Tissue Perfusion: Goal: Complications of ischemic stroke/TIA will be minimized Outcome: Progressing

## 2019-03-21 NOTE — TOC Transition Note (Signed)
Transition of Care [facility]) - CM/SW Discharge Note   Patient Details  Name: STEDMAN SUMMERVILLE MRN: [id number] Date of Birth: 12-Jul-1946  Transition of Care [facility]) CM/SW Contact:  Pollie Friar, RN Phone Number: 03/21/2019, 1:22 PM   Clinical Narrative:    Pt discharging to [facility] today. Pt to transport via [facility]. D/c packet at the desk and bedside RN aware.   Number for report: [phone] Pt going to room: 212A   Final next level of care: Skilled Nursing Facility Barriers to Discharge: No Barriers Identified   Patient Goals and CMS Choice   CMS Medicare.gov Compare Post Acute Care list provided to:: Patient Represenative (must comment) Choice offered to / list presented to : Adult Children  Discharge Placement              Patient chooses bed at: [facility] Patient to be transferred to facility by: [place] Name of family member notified: [name] Patient and family notified of of transfer: 03/21/19  Discharge Plan and Services                                     Social Determinants of Health (SDOH) Interventions     Readmission Risk Interventions No flowsheet data found.

## 2019-03-21 NOTE — Discharge Summary (Addendum)
Physician Discharge Summary  John Blackburn HQP:[id number] DOB: 04/11/1946 DOA: 03/03/2019  PCP: Hoyt Koch, MD  Admit date: 03/03/2019 Discharge date: 03/21/2019  Admitted From: Home Disposition:  SNF Discharge Condition:Stable CODE STATUS:FULL Diet recommendation: Dysphagia 3  Brief/Interim Summary:  Patient is a 73 year old male with history of hypertension, hyperlipidemia, GERD, depression,CAD status post stent placement, PE not on anticoagulation, OSA on CPAP, diastolic CHF,memory loss  who presented from home with generalized weakness.  He was suspected to have aspiration pneumonia and was treated with antibiotics.  Currently he is hemodynamically stable .He was seen by physical therapy and recommended skilled nursing facility on discharge.   Following problems were addressed during hospitalization:  Generalized weakness: Initial concern for stroke.  MRI negative for stroke.  TSH normal.  Patient evaluated by physical therapy and recommended skilled nursing facility.  Patient unable to perform ADLs and lives alone.    Aspiration pneumonia/urinary tract infection: Presented with leukocytosis in the range of  30,000.  Elevated procalcitonin.Now significantly improved.    Urine culture showed multiple species.  Blood cultures did not show any growth.  CXR showed suspected increasing opacity in the left retrocardiac region representing  atelectasis or infiltrate.was treated with Unasyn.  Speech therapy evaluated him and recommended dysphagia 3 diet.  Acute kidney injury: Resolved  Essential hypertension: Currently blood pressure well controlled.  Continue current meds  OSA: Continue CPAP.Setting:IPAP:10, EPAP:10, oxygen percentage 21, ZL:93/TTS  Chronic diastolic CHF: Currently euvolemic.  GERD: Continue protonix  Memory loss: On Namenda.  His manager working on placement on memory care.  Depression/anxiety: On Lexapro, Wellbutrin    Discharge Diagnoses:   Principal Problem:   Generalized weakness Active Problems:   Hypertension   Hyperlipidemia   Obstructive sleep apnea   Chronic diastolic (congestive) heart failure (HCC)   GERD (gastroesophageal reflux disease)   Memory loss   Hypokalemia   Weakness generalized   CAD (coronary artery disease)    Discharge Instructions  Discharge Instructions    Call MD for:  difficulty breathing, headache or visual disturbances   Complete by: As directed    Call MD for:  persistant dizziness or light-headedness   Complete by: As directed    Call MD for:  persistant nausea and vomiting   Complete by: As directed    Call MD for:  severe uncontrolled pain   Complete by: As directed    Diet - low sodium heart healthy   Complete by: As directed    Increase activity slowly   Complete by: As directed      Allergies as of 03/21/2019      Reactions   Cephalexin Rash   Levofloxacin Rash      Medication List    TAKE these medications   amLODipine 5 MG tablet Commonly known as: NORVASC Take 1 tablet (5 mg total) by mouth daily.   aspirin 81 MG EC tablet Take 1 tablet (81 mg total) by mouth daily.   atorvastatin 80 MG tablet Commonly known as: LIPITOR Take 1 tablet (80 mg total) by mouth daily at 6 PM. KEEP OV. What changed: when to take this   buPROPion 300 MG 24 hr tablet Commonly known as: WELLBUTRIN XL TAKE 1 TABLET BY MOUTH ONCE DAILY   clopidogrel 75 MG tablet Commonly known as: PLAVIX Take 1 tablet (75 mg total) by mouth daily. Patient will be scheduled for AUGUST 2020.   escitalopram 10 MG tablet Commonly known as: LEXAPRO Take 1 tablet by mouth once daily  loratadine 10 MG tablet Commonly known as: CLARITIN Take 1 tablet (10 mg total) by mouth daily as needed for allergies.   memantine 7 MG Cp24 24 hr capsule Commonly known as: NAMENDA XR TAKE 1  BY MOUTH ONCE DAILY What changed: See the new instructions.   nitroGLYCERIN 0.4 MG SL tablet Commonly known as:  Nitrostat Place 1 tablet (0.4 mg total) under the tongue every 5 (five) minutes as needed.   pantoprazole 40 MG tablet Commonly known as: PROTONIX Take 1 tablet (40 mg total) by mouth daily. Patient will be scheduled for AUGUST 2020.   senna-docusate 8.6-50 MG tablet Commonly known as: Senokot-S Take 1 tablet by mouth 2 (two) times daily as needed for mild constipation.       Contact information for follow-up providers    Hoyt Koch, MD Follow up.   Specialty: Internal Medicine Contact information: [place] [id number] [phone]        Lorretta Harp, MD .   Specialties: Cardiology, Radiology Contact information: [address] [place] [address] [phone]            Contact information for after-discharge care    Destination    [facility] .   Service: Skilled Nursing Contact information: [place] [phone]                 Allergies  Allergen Reactions  . Cephalexin Rash  . Levofloxacin Rash    Consultations:  None   Procedures/Studies: Ct Head Wo Contrast  Result Date: 03/03/2019 CLINICAL DATA:  Altered level of consciousness, history coronary artery disease, hypertension EXAM: CT HEAD WITHOUT CONTRAST TECHNIQUE: Contiguous axial images were obtained from the base of the skull through the vertex without intravenous contrast. Sagittal and coronal MPR images reconstructed from axial data set. COMPARISON:  02/21/2019 FINDINGS: Brain: Generalized atrophy. Normal ventricular morphology. No midline shift or mass effect. Small vessel chronic ischemic changes of deep cerebral white matter. Old lacunar infarcts at the basal ganglia and LEFT thalamus with extension into RIGHT periventricular white matter. No intracranial hemorrhage or mass lesion. Subtle area of new low-attenuation at the LEFT occipital lobe suspicious for  acute infarct. No extra-axial fluid collections. Vascular: Mild atherosclerotic calcifications within vertebral and carotid arteries at skull base. Skull: Grossly intact, scattered motion artifacts noted Sinuses/Orbits: Clear Other: N/A IMPRESSION: Atrophy with small vessel chronic ischemic changes of deep cerebral white matter. 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 New area of subtle low attenuation in the LEFT occipital lobe suspicious for acute infarct. No intracranial hemorrhage. Electronically Signed   By: Lavonia Dana M.D.   On: 03/03/2019 22:56   Ct Head Wo Contrast  Result Date: 02/21/2019 CLINICAL DATA:  Fall.  Head injury. EXAM: CT HEAD WITHOUT CONTRAST CT CERVICAL SPINE WITHOUT CONTRAST TECHNIQUE: Multidetector CT imaging of the head and cervical spine was performed following the standard protocol without intravenous contrast. Multiplanar CT image reconstructions of the cervical spine were also generated. COMPARISON:  CT head 04/23/2018 FINDINGS: CT HEAD FINDINGS Brain: Generalized atrophy. Chronic microvascular ischemic change throughout the white matter and basal ganglia. No acute infarct. Negative for hemorrhage or mass. No midline shift. No change from the prior. Vascular: Negative for hyperdense vessel. Atherosclerotic calcification. Skull: Negative for skull fracture Sinuses/Orbits: Mild mucosal edema paranasal sinuses. Negative orbit. Other: None CT CERVICAL SPINE FINDINGS Alignment: Mild retrolisthesis C3-4 and C4-5 Skull base and vertebrae: Negative for fracture Soft tissues and  spinal canal: Negative Disc levels: Multilevel disc degeneration and spurring in the cervical spine. Multilevel spinal and foraminal stenosis due to spurring. Upper chest: Negative Other: None IMPRESSION: 1. No acute intracranial abnormality. Atrophy with extensive chronic microvascular ischemia 2. Negative for cervical spine fracture. Moderate cervical spondylosis. Electronically Signed   By: Franchot Gallo M.D.   On:  02/21/2019 18:44   Ct Cervical Spine Wo Contrast  Result Date: 02/21/2019 CLINICAL DATA:  Fall.  Head injury. EXAM: CT HEAD WITHOUT CONTRAST CT CERVICAL SPINE WITHOUT CONTRAST TECHNIQUE: Multidetector CT imaging of the head and cervical spine was performed following the standard protocol without intravenous contrast. Multiplanar CT image reconstructions of the cervical spine were also generated. COMPARISON:  CT head 04/23/2018 FINDINGS: CT HEAD FINDINGS Brain: Generalized atrophy. Chronic microvascular ischemic change throughout the white matter and basal ganglia. No acute infarct. Negative for hemorrhage or mass. No midline shift. No change from the prior. Vascular: Negative for hyperdense vessel. Atherosclerotic calcification. Skull: Negative for skull fracture Sinuses/Orbits: Mild mucosal edema paranasal sinuses. Negative orbit. Other: None CT CERVICAL SPINE FINDINGS Alignment: Mild retrolisthesis C3-4 and C4-5 Skull base and vertebrae: Negative for fracture Soft tissues and spinal canal: Negative Disc levels: Multilevel disc degeneration and spurring in the cervical spine. Multilevel spinal and foraminal stenosis due to spurring. Upper chest: Negative Other: None IMPRESSION: 1. No acute intracranial abnormality. Atrophy with extensive chronic microvascular ischemia 2. Negative for cervical spine fracture. Moderate cervical spondylosis. Electronically Signed   By: Franchot Gallo M.D.   On: 02/21/2019 18:44   Mr Brain Wo Contrast  Result Date: 03/04/2019 CLINICAL DATA:  Focal neuro deficit EXAM: MRI HEAD WITHOUT CONTRAST TECHNIQUE: Multiplanar, multiecho pulse sequences of the brain and surrounding structures were obtained without intravenous contrast. COMPARISON:  Head CT from yesterday.  Brain MRI 07/01/2018 FINDINGS: Brain: No acute infarction, hemorrhage, hydrocephalus, extra-axial collection or mass lesion. Advanced chronic small vessel ischemia with confluent gliosis in the deep white matter and  across the bilateral pons. There are accentuated dilated perivascular spaces with remote bilateral thalamic infarcts and a right lateral lenticulostriate basal ganglia infarct. Chronic blood products in the right parietal lobe. Mild cerebral volume loss. Vascular: Major flow voids are preserved. Skull and upper cervical spine: Negative for marrow lesion Sinuses/Orbits: Negative IMPRESSION: 1. No acute finding. 2. Advanced chronic small vessel ischemia. Electronically Signed   By: Monte Fantasia M.D.   On: 03/04/2019 07:01   Dg Chest Port 1 View  Result Date: 03/15/2019 CLINICAL DATA:  Fever and cough. EXAM: PORTABLE CHEST 1 VIEW COMPARISON:  March 03, 2019 FINDINGS: There is either pleural thickening or a tiny effusion on the left. There appears to be increased density in the left retrocardiac region. No other interval changes. IMPRESSION: Suspected increasing opacity in the left retrocardiac region may represent atelectasis or infiltrate. Recommend attention on follow-up. Tiny left pleural effusion versus pleural thickening. Electronically Signed   By: Dorise Bullion III M.D   On: 03/15/2019 13:40   Dg Chest Port 1 View  Result Date: 03/03/2019 CLINICAL DATA:  Cough EXAM: PORTABLE CHEST 1 VIEW COMPARISON:  04/23/2018 FINDINGS: Heart is borderline in size. No confluent airspace opacities. Blunting of the left costophrenic angle may reflect small left effusion or pleural thickening. IMPRESSION: Small left effusion versus pleural thickening. Electronically Signed   By: Rolm Baptise M.D.   On: 03/03/2019 22:58   Dg Knee Complete 4 Views Left  Result Date: 02/21/2019 CLINICAL DATA:  Fall.  Pain EXAM: LEFT KNEE -  COMPLETE 4+ VIEW COMPARISON:  None. FINDINGS: Total knee replacement in satisfactory position alignment. No fracture or joint effusion. IMPRESSION: No acute abnormality. Electronically Signed   By: Franchot Gallo M.D.   On: 02/21/2019 18:46   Dg Swallowing Func-speech Pathology  Result Date:  03/19/2019 Objective Swallowing Evaluation: Type of Study: MBS-Modified Barium Swallow Study  Patient Details Name: John Blackburn MRN: [id number] Date of Birth: 1946-05-21 Today's Date: 03/19/2019 Time: SLP Start Time (ACUTE ONLY): 5681 -SLP Stop Time (ACUTE ONLY): 1050 SLP Time Calculation (min) (ACUTE ONLY): 18 min Past Medical History: Past Medical History: Diagnosis Date . Allergy  . Arthritis   knees,but better after TKR bilateral . CAD (coronary artery disease)   08/2017 PCI/DES mRCA, RPDA, CTO pf pLCX with collaterals, normal EF . Depression   on multiple meds. Has been seen at [facility]. and Dr. Sabra Heck is his prescriber . Diverticulitis of colon 2000 's  treated as an outpatient . GERD (gastroesophageal reflux disease)   UGI done August '12 - ulcer/. Dr. Ferdinand Lango, gastroenterologist in [facility].  [name] Hx of adenomatous colonic polyps  . Hyperlipidemia   last lipid panel: HDL 44, LDL 117 . Hypertension  . Myocardial infarction ([place])  . Peptic ulcer   in the past and just recently-August '12 . Pulmonary emboli [facility])   noted October 2014 - treated by Dr. Linda Hedges . skin cancer   skin CA . Sleep apnea, primary central   wears CPAP Past Surgical History: Past Surgical History: Procedure Laterality Date . ANAL RECTAL MANOMETRY N/A 11/30/2016  Procedure: ANO RECTAL MANOMETRY;  Surgeon: Mauri Pole, MD;  Location: [facility];  Service: Endoscopy;  Laterality: N/A; . CARDIAC CATHETERIZATION   . CHOLECYSTECTOMY  1990  laproscopic  . CORONARY STENT INTERVENTION N/A 09/01/2017  Procedure: CORONARY STENT INTERVENTION;  Surgeon: Jettie Booze, MD;  Location: [place] [facility];  Service: Cardiovascular;  Laterality: N/A; . HERNIA REPAIR   . JOINT REPLACEMENT    knee . PROSTATE SURGERY    needle biopsy's, TURP . RIGHT/LEFT HEART CATH AND CORONARY ANGIOGRAPHY N/A 09/01/2017  Procedure: RIGHT/LEFT HEART CATH AND CORONARY ANGIOGRAPHY;  Surgeon: Jettie Booze, MD;  Location: [place] [facility];  Service:  Cardiovascular;  Laterality: N/A; . TKR bilateral  2006  Dr. Violet Baldy . UPPER GASTROINTESTINAL ENDOSCOPY   . VASECTOMY   HPI: Patient is a 73 year old male. Per notes sons report that over the past 2 weeks the patient has had increasing weakness. He is confused today. 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 PMH: PE, sleep apnea, skin cancer, MI, depression, GERD, HTN, allergies, OA, bilateral knee replacements  Subjective: pleasant, low vocal intensity and hoarse voice, condom cath has come off and patient has urinated on self Assessment / Plan / Recommendation CHL IP CLINICAL IMPRESSIONS 03/19/2019 Clinical Impression Pt's oropharyngeal range of motion and strength noted to be functional although residue in valleculae ranged from none to moderate (following solid). While swallowing barium pill with thin there was flash penetration of thin that is common likely due to coordination during with dual consistencies. Residue was cleared with sips thin. Minimal and inconsistent instances of barium in cervical esophagus area. Pill hesitated in esophagus before transiting with sips thin. Recommend thin liquids, Dys 3 texture, no straws and pills whole in applesauce. Speech Pathology will continue to follow.      SLP Visit Diagnosis Dysphagia, pharyngeal phase (R13.13) Attention and concentration deficit following -- Frontal lobe and executive function deficit following --  Impact on safety and function Mild aspiration risk   CHL IP TREATMENT RECOMMENDATION 03/19/2019 Treatment Recommendations Therapy as outlined in treatment plan below   Prognosis 03/19/2019 Prognosis for Safe Diet Advancement Good Barriers to Reach Goals Cognitive deficits Barriers/Prognosis Comment -- CHL IP DIET RECOMMENDATION 03/19/2019 SLP Diet Recommendations Dysphagia 3 (Mech soft) solids;Thin liquid Liquid Administration via Cup;Straw Medication Administration Whole meds with puree Compensations Small sips/bites;Slow rate Postural Changes Seated  upright at 90 degrees   CHL IP OTHER RECOMMENDATIONS 03/19/2019 Recommended Consults -- Oral Care Recommendations Oral care BID Other Recommendations --   CHL IP FOLLOW UP RECOMMENDATIONS 03/19/2019 Follow up Recommendations 24 hour supervision/assistance   CHL IP FREQUENCY AND DURATION 03/19/2019 Speech Therapy Frequency (ACUTE ONLY) min 2x/week Treatment Duration 2 weeks      CHL IP ORAL PHASE 03/19/2019 Oral Phase WFL Oral - Pudding Teaspoon -- Oral - Pudding Cup -- Oral - Honey Teaspoon -- Oral - Honey Cup -- Oral - Nectar Teaspoon -- Oral - Nectar Cup -- Oral - Nectar Straw -- Oral - Thin Teaspoon -- Oral - Thin Cup -- Oral - Thin Straw -- Oral - Puree -- Oral - Mech Soft -- Oral - Regular -- Oral - Multi-Consistency -- Oral - Pill -- Oral Phase - Comment --  CHL IP PHARYNGEAL PHASE 03/19/2019 Pharyngeal Phase Impaired Pharyngeal- Pudding Teaspoon -- Pharyngeal -- Pharyngeal- Pudding Cup -- Pharyngeal -- Pharyngeal- Honey Teaspoon -- Pharyngeal -- Pharyngeal- Honey Cup -- Pharyngeal -- Pharyngeal- Nectar Teaspoon -- Pharyngeal -- Pharyngeal- Nectar Cup WFL Pharyngeal -- Pharyngeal- Nectar Straw -- Pharyngeal -- Pharyngeal- Thin Teaspoon -- Pharyngeal -- Pharyngeal- Thin Cup Penetration/Aspiration during swallow Pharyngeal Material enters airway, remains ABOVE vocal cords then ejected out Pharyngeal- Thin Straw Pharyngeal residue - valleculae Pharyngeal -- Pharyngeal- Puree -- Pharyngeal -- Pharyngeal- Mechanical Soft -- Pharyngeal -- Pharyngeal- Regular Pharyngeal residue - valleculae Pharyngeal -- Pharyngeal- Multi-consistency -- Pharyngeal -- Pharyngeal- Pill -- Pharyngeal -- Pharyngeal Comment --  CHL IP CERVICAL ESOPHAGEAL PHASE 03/19/2019 Cervical Esophageal Phase (No Data) Pudding Teaspoon -- Pudding Cup -- Honey Teaspoon -- Honey Cup -- Nectar Teaspoon -- Nectar Cup -- Nectar Straw -- Thin Teaspoon -- Thin Cup -- Thin Straw -- Puree -- Mechanical Soft -- Regular -- Multi-consistency -- Pill -- Cervical  Esophageal Comment -- [place] Siren 03/19/2019, 1:01 PM Orbie Pyo Litaker M.Ed Actor Pager [phone] Office [phone]              Dg Hip Malvin Johns Or Wo Pelvis 2-3 Views Left  Result Date: 02/21/2019 CLINICAL DATA:  Fall.  Left hip pain EXAM: DG HIP (WITH OR WITHOUT PELVIS) 2-3V LEFT COMPARISON:  None. FINDINGS: Negative for fracture. Mild degenerative change in the left hip and moderate degenerative change in the right hip. Degenerative change in the lumbar spine. IMPRESSION: Negative for fracture. Electronically Signed   By: Franchot Gallo M.D.   On: 02/21/2019 18:45       Subjective:  Patient seen and examined the bedside this morning.  Comfortable.  Hemodynamically stable for discharge.  Discharge Exam: Vitals:   03/21/19 0358 03/21/19 0736  BP: 130/72 125/81  Pulse: 66 71  Resp: 17 18  Temp: 98 F (36.7 C) 98.3 F (36.8 C)  SpO2: 98% 97%   Vitals:   03/20/19 1932 03/20/19 2344 03/21/19 0358 03/21/19 0736  BP: 121/76 134/86 130/72 125/81  Pulse: 73 79 66 71  Resp: 16 16 17 18   Temp: 98.1 F (36.7 C) 97.6 F (36.4 C)  81 F (36.7 C) 98.3 F (36.8 C)  TempSrc: Oral Oral Oral Oral  SpO2: 97% 100% 98% 97%  Weight:      Height:        General: Pt is alert, awake, not in acute distress Cardiovascular: RRR, S1/S2 +, no rubs, no gallops Respiratory: CTA bilaterally, no wheezing, no rhonchi Abdominal: Soft, NT, ND, bowel sounds + Extremities: no edema, no cyanosis    The results of significant diagnostics from this hospitalization (including imaging, microbiology, ancillary and laboratory) are listed below for reference.     Microbiology: Recent Results (from the past 240 hour(s))  Novel Coronavirus, NAA (hospital order; send-out to ref lab)     Status: None   Collection Time: 03/11/19  1:42 PM   Specimen: Nasopharyngeal Swab; Respiratory  Result Value Ref Range Status   SARS-CoV-2, NAA NOT DETECTED NOT DETECTED Final     Comment: (NOTE) This test was developed and its performance characteristics determined by [employer]. This test has not been FDA cleared or approved. This test has been authorized by FDA under an Emergency Use Authorization (EUA). This test is only authorized for the duration of time the declaration that circumstances exist justifying the authorization of the emergency use of in vitro diagnostic tests for detection of SARS-CoV-2 virus and/or diagnosis of COVID-19 infection under section 564(b)(1) of the Act, 21 U.S.C. 967ELF-8(B)(0), unless the authorization is terminated or revoked sooner. When diagnostic testing is negative, the possibility of a false negative result should be considered in the context of a patient's recent exposures and the presence of clinical signs and symptoms consistent with COVID-19. An individual without symptoms of COVID-19 and who is not shedding SARS-CoV-2 virus would expect to have a negative (not detected) result in this assay. Performed  At: [facility] [address] [place], [place] [id number] Rush Farmer MD ID:[id number]    Ila  Final    Comment: Performed at [facility], [place] [address] [place], [place] [address]  Culture, blood (routine x 2)     Status: None   Collection Time: 03/15/19  6:35 PM   Specimen: BLOOD  Result Value Ref Range Status   Specimen Description BLOOD LEFT ANTECUBITAL  Final   Special Requests   Final    BOTTLES DRAWN AEROBIC AND ANAEROBIC Blood Culture results may not be optimal due to an excessive volume of blood received in culture bottles   Culture   Final    NO GROWTH 5 DAYS Performed at [facility], [place] [address], [place], [place] [address]    Report Status 03/20/2019 FINAL  Final  Culture, blood (routine x 2)     Status: None   Collection Time: 03/15/19  6:59 PM   Specimen: BLOOD RIGHT HAND  Result Value Ref Range Status   Specimen Description BLOOD  RIGHT HAND  Final   Special Requests AEROBIC BOTTLE ONLY Blood Culture adequate volume  Final   Culture   Final    NO GROWTH 5 DAYS Performed at [facility], [place] [address] [place], [place] [address]    Report Status 03/20/2019 FINAL  Final  Culture, Urine     Status: Abnormal   Collection Time: 03/15/19 10:48 PM   Specimen: Urine, Clean Catch  Result Value Ref Range Status   Specimen Description URINE, CLEAN CATCH  Final   Special Requests   Final    NONE Performed at [facility], [place],  [place] [address]    Culture (A)  Final    >=100,000 COLONIES/mL MULTIPLE SPECIES PRESENT, SUGGEST RECOLLECTION   Report Status 03/17/2019 FINAL  Final  Culture, Urine     Status: Abnormal   Collection Time: 03/17/19 10:44 AM   Specimen: Urine, Random  Result Value Ref Range Status   Specimen Description URINE, RANDOM  Final   Special Requests NONE  Final   Culture (A)  Final    <10,000 COLONIES/mL INSIGNIFICANT GROWTH Performed at [facility], [place] [address], [place], [place] [address]    Report Status 03/18/2019 FINAL  Final     Labs: BNP (last 3 results) Recent Labs    03/04/19 0455  BNP 579.0*   Basic Metabolic Panel: Recent Labs  Lab 03/16/19 0457 03/17/19 0503 03/20/19 0459  NA 139 142 140  K 4.0 4.0 3.6  CL 101 108 104  CO2 26 26 26   GLUCOSE 123* 145* 120*  BUN 28* 24* 13  CREATININE 1.32* 1.15 0.83  CALCIUM 9.1 8.7* 8.9   Liver Function Tests: No results for input(s): AST, ALT, ALKPHOS, BILITOT, PROT, ALBUMIN in the last 168 hours. No results for input(s): LIPASE, AMYLASE in the last 168 hours. No results for input(s): AMMONIA in the last 168 hours. CBC: Recent Labs  Lab 03/15/19 1203 03/16/19 0457 03/17/19 0503  WBC 32.1* 22.3* 7.3  HGB 15.3 14.5 13.4  HCT 45.1 43.0 41.7  MCV 98.0 98.4 101.7*  PLT 318 284 218   Cardiac Enzymes: No results for input(s): CKTOTAL, CKMB, CKMBINDEX, TROPONINI in the last 168  hours. BNP: Invalid input(s): POCBNP CBG: No results for input(s): GLUCAP in the last 168 hours. D-Dimer No results for input(s): DDIMER in the last 72 hours. Hgb A1c No results for input(s): HGBA1C in the last 72 hours. Lipid Profile No results for input(s): CHOL, HDL, LDLCALC, TRIG, CHOLHDL, LDLDIRECT in the last 72 hours. Thyroid function studies No results for input(s): TSH, T4TOTAL, T3FREE, THYROIDAB in the last 72 hours.  Invalid input(s): FREET3 Anemia work up No results for input(s): VITAMINB12, FOLATE, FERRITIN, TIBC, IRON, RETICCTPCT in the last 72 hours. Urinalysis    Component Value Date/Time   COLORURINE YELLOW 03/03/2019 2340   APPEARANCEUR CLEAR 03/03/2019 2340   LABSPEC 1.025 03/03/2019 2340   PHURINE 6.0 03/03/2019 2340   GLUCOSEU NEGATIVE 03/03/2019 2340   HGBUR TRACE (A) 03/03/2019 2340   BILIRUBINUR NEGATIVE 03/03/2019 2340   BILIRUBINUR negative 09/13/2016 1816   BILIRUBINUR neg 12/06/2011 1743   KETONESUR NEGATIVE 03/03/2019 2340   PROTEINUR 30 (A) 03/03/2019 2340   UROBILINOGEN 0.2 09/13/2016 1816   NITRITE NEGATIVE 03/03/2019 2340   LEUKOCYTESUR NEGATIVE 03/03/2019 2340   Seps[facility] Labs Invalid input(s): PROCALCITONIN,  WBC,  LACTICIDVEN Microbiology Recent Results (from the past 240 hour(s))  Novel Coronavirus, NAA (hospital order; send-out to ref lab)     Status: None   Collection Time: 03/11/19  1:42 PM   Specimen: Nasopharyngeal Swab; Respiratory  Result Value Ref Range Status   SARS-CoV-2, NAA NOT DETECTED NOT DETECTED Final    Comment: (NOTE) This test was developed and its performance characteristics determined by [employer]. This test has not been FDA cleared or approved. This test has been authorized by FDA under an Emergency Use Authorization (EUA). This test is only authorized for the duration of time the declaration that circumstances exist justifying the authorization of the emergency use of in vitro diagnostic tests for  detection of SARS-CoV-2 virus and/or diagnosis of COVID-19 infection  under section 564(b)(1) of the Act, 21 U.S.C. 867YPP-5(K)(9), unless the authorization is terminated or revoked sooner. When diagnostic testing is negative, the possibility of a false negative result should be considered in the context of a patient's recent exposures and the presence of clinical signs and symptoms consistent with COVID-19. An individual without symptoms of COVID-19 and who is not shedding SARS-CoV-2 virus would expect to have a negative (not detected) result in this assay. Performed  At: [facility] [address] [place], [place] [id number] Rush Farmer MD KD:[id number]    [place]  Final    Comment: Performed at [facility], [place] [address], [place], [place] [address]  Culture, blood (routine x 2)     Status: None   Collection Time: 03/15/19  6:35 PM   Specimen: BLOOD  Result Value Ref Range Status   Specimen Description BLOOD LEFT ANTECUBITAL  Final   Special Requests   Final    BOTTLES DRAWN AEROBIC AND ANAEROBIC Blood Culture results may not be optimal due to an excessive volume of blood received in culture bottles   Culture   Final    NO GROWTH 5 DAYS Performed at [facility], [place] [address] [place], [place] [address]    Report Status 03/20/2019 FINAL  Final  Culture, blood (routine x 2)     Status: None   Collection Time: 03/15/19  6:59 PM   Specimen: BLOOD RIGHT HAND  Result Value Ref Range Status   Specimen Description BLOOD RIGHT HAND  Final   Special Requests AEROBIC BOTTLE ONLY Blood Culture adequate volume  Final   Culture   Final    NO GROWTH 5 DAYS Performed at [facility], [place] [address], [place], [place] [address]    Report Status 03/20/2019 FINAL  Final  Culture, Urine     Status: Abnormal   Collection Time: 03/15/19 10:48 PM   Specimen: Urine, Clean Catch  Result Value Ref Range Status   Specimen Description URINE,  CLEAN CATCH  Final   Special Requests   Final    NONE Performed at [facility], [place] [address] [place], [place] [address]    Culture (A)  Final    >=100,000 COLONIES/mL MULTIPLE SPECIES PRESENT, SUGGEST RECOLLECTION   Report Status 03/17/2019 FINAL  Final  Culture, Urine     Status: Abnormal   Collection Time: 03/17/19 10:44 AM   Specimen: Urine, Random  Result Value Ref Range Status   Specimen Description URINE, RANDOM  Final   Special Requests NONE  Final   Culture (A)  Final    <10,000 COLONIES/mL INSIGNIFICANT GROWTH Performed at [facility], [place] [address], [place], [place] [address]    Report Status 03/18/2019 FINAL  Final    Please note: You were cared for by a hospitalist during your hospital stay. Once you are discharged, your primary care physician will handle any further medical issues. Please note that NO REFILLS for any discharge medications will be authorized once you are discharged, as it is imperative that you return to your primary care physician (or establish a relationship with a primary care physician if you do not have one) for your post hospital discharge needs so that they can reassess your need for medications and monitor your lab values.    Time coordinating discharge: 40 minutes  SIGNED:   Shelly Coss, MD  [facility] 03/21/2019, 10:40 AM Pager [phone]  If 7PM-7AM, please contact night-coverage [email or web address]  Password [email or web address]

## 2019-03-22 ENCOUNTER — Telehealth: Payer: Self-pay | Admitting: *Deleted

## 2019-03-22 DIAGNOSIS — R2681 Unsteadiness on feet: Secondary | ICD-10-CM | POA: Diagnosis not present

## 2019-03-22 DIAGNOSIS — I5030 Unspecified diastolic (congestive) heart failure: Secondary | ICD-10-CM | POA: Diagnosis not present

## 2019-03-22 DIAGNOSIS — N390 Urinary tract infection, site not specified: Secondary | ICD-10-CM | POA: Diagnosis not present

## 2019-03-22 DIAGNOSIS — M6281 Muscle weakness (generalized): Secondary | ICD-10-CM | POA: Diagnosis not present

## 2019-03-22 DIAGNOSIS — R2689 Other abnormalities of gait and mobility: Secondary | ICD-10-CM | POA: Diagnosis not present

## 2019-03-22 DIAGNOSIS — G4733 Obstructive sleep apnea (adult) (pediatric): Secondary | ICD-10-CM | POA: Diagnosis not present

## 2019-03-22 DIAGNOSIS — R1312 Dysphagia, oropharyngeal phase: Secondary | ICD-10-CM | POA: Diagnosis not present

## 2019-03-22 DIAGNOSIS — I259 Chronic ischemic heart disease, unspecified: Secondary | ICD-10-CM | POA: Diagnosis not present

## 2019-03-22 NOTE — Telephone Encounter (Signed)
Pt was on TCM report admitted 03/03/19 for generalized weakness. He was suspected tohave aspiration pneumonia andwastreated with antibiotics.  Currently he is hemodynamically stable , but seen by physical therapy and recommended skilled nursing facility due to pt not able to do ADL's alone. Pt D/C 03/21/19 to SNF. Per summary will need to follow=up w/PCP after discharge from facility.Marland KitchenJohny Blackburn

## 2019-03-25 ENCOUNTER — Other Ambulatory Visit: Payer: Self-pay | Admitting: *Deleted

## 2019-03-25 DIAGNOSIS — I6389 Other cerebral infarction: Secondary | ICD-10-CM | POA: Diagnosis not present

## 2019-03-25 DIAGNOSIS — N178 Other acute kidney failure: Secondary | ICD-10-CM | POA: Diagnosis not present

## 2019-03-25 DIAGNOSIS — R5381 Other malaise: Secondary | ICD-10-CM | POA: Diagnosis not present

## 2019-03-25 DIAGNOSIS — F0150 Vascular dementia without behavioral disturbance: Secondary | ICD-10-CM | POA: Diagnosis not present

## 2019-03-25 DIAGNOSIS — R2689 Other abnormalities of gait and mobility: Secondary | ICD-10-CM | POA: Diagnosis not present

## 2019-03-25 DIAGNOSIS — J188 Other pneumonia, unspecified organism: Secondary | ICD-10-CM | POA: Diagnosis not present

## 2019-03-25 NOTE — Patient Outreach (Signed)
[facility]) Care Management  03/25/2019  John Blackburn Sep 30, 1945 [id number]   CSW attempted to reach pt and family by phone today and was unsuccessful. CSW left a HIPPA compliant message for pt's nephew and will await call back.   Eduard Clos, MSW, [place] Worker  [place] [phone]

## 2019-03-29 ENCOUNTER — Encounter: Payer: Self-pay | Admitting: *Deleted

## 2019-03-29 ENCOUNTER — Other Ambulatory Visit: Payer: Self-pay | Admitting: *Deleted

## 2019-03-29 DIAGNOSIS — J188 Other pneumonia, unspecified organism: Secondary | ICD-10-CM | POA: Diagnosis not present

## 2019-03-29 DIAGNOSIS — F5105 Insomnia due to other mental disorder: Secondary | ICD-10-CM | POA: Diagnosis not present

## 2019-03-29 DIAGNOSIS — R5381 Other malaise: Secondary | ICD-10-CM | POA: Diagnosis not present

## 2019-03-29 DIAGNOSIS — G4739 Other sleep apnea: Secondary | ICD-10-CM | POA: Diagnosis not present

## 2019-03-29 NOTE — Patient Outreach (Signed)
[place]) Care Management  03/29/2019  ALDAIR RICKEL 01/03/1946 [id number]   CSW was unable to make contact with pt/family and did leave a HIPPA compliant voice message. CSW will plan case closure due to inability to contact after 3 attempts in 10 business days.   CSW will send case closure letter and advise [facility] team and PCP.   Eduard Clos, MSW, [place] Worker  [place] [phone]

## 2019-03-29 NOTE — Patient Outreach (Signed)
[facility]) Care Management  03/29/2019  John Blackburn 06-13-1946 [id number]   CSW received call back from pt's son, [name], who updated CSW that pt has been placed in SNF rehab at [facility]. He anticipates pt will remain there or another SNF for long term care. He has applied for Medicaid also.  CSW advised him to call if needs or questions arise and that CSW was closing case referral.  Son appreciative of guidance and support.   Eduard Clos, MSW, [place] Worker  [place] [phone]

## 2019-04-01 DIAGNOSIS — R2689 Other abnormalities of gait and mobility: Secondary | ICD-10-CM | POA: Diagnosis not present

## 2019-04-03 DIAGNOSIS — R2689 Other abnormalities of gait and mobility: Secondary | ICD-10-CM | POA: Diagnosis not present

## 2019-04-03 DIAGNOSIS — F0150 Vascular dementia without behavioral disturbance: Secondary | ICD-10-CM | POA: Diagnosis not present

## 2019-04-08 DIAGNOSIS — R2689 Other abnormalities of gait and mobility: Secondary | ICD-10-CM | POA: Diagnosis not present

## 2019-04-08 DIAGNOSIS — R2681 Unsteadiness on feet: Secondary | ICD-10-CM | POA: Diagnosis not present

## 2019-04-08 DIAGNOSIS — R41841 Cognitive communication deficit: Secondary | ICD-10-CM | POA: Diagnosis not present

## 2019-04-08 DIAGNOSIS — N390 Urinary tract infection, site not specified: Secondary | ICD-10-CM | POA: Diagnosis not present

## 2019-04-08 DIAGNOSIS — G4733 Obstructive sleep apnea (adult) (pediatric): Secondary | ICD-10-CM | POA: Diagnosis not present

## 2019-04-08 DIAGNOSIS — M6281 Muscle weakness (generalized): Secondary | ICD-10-CM | POA: Diagnosis not present

## 2019-04-08 DIAGNOSIS — R1312 Dysphagia, oropharyngeal phase: Secondary | ICD-10-CM | POA: Diagnosis not present

## 2019-04-08 DIAGNOSIS — I5030 Unspecified diastolic (congestive) heart failure: Secondary | ICD-10-CM | POA: Diagnosis not present

## 2019-04-08 DIAGNOSIS — I259 Chronic ischemic heart disease, unspecified: Secondary | ICD-10-CM | POA: Diagnosis not present

## 2019-04-26 DIAGNOSIS — I2510 Atherosclerotic heart disease of native coronary artery without angina pectoris: Secondary | ICD-10-CM | POA: Diagnosis not present

## 2019-04-26 DIAGNOSIS — F39 Unspecified mood [affective] disorder: Secondary | ICD-10-CM | POA: Diagnosis not present

## 2019-04-26 DIAGNOSIS — I5032 Chronic diastolic (congestive) heart failure: Secondary | ICD-10-CM | POA: Diagnosis not present

## 2019-04-26 DIAGNOSIS — R5381 Other malaise: Secondary | ICD-10-CM | POA: Diagnosis not present

## 2019-04-26 DIAGNOSIS — I6389 Other cerebral infarction: Secondary | ICD-10-CM | POA: Diagnosis not present

## 2019-04-26 DIAGNOSIS — F0150 Vascular dementia without behavioral disturbance: Secondary | ICD-10-CM | POA: Diagnosis not present

## 2019-04-30 ENCOUNTER — Ambulatory Visit: Payer: PPO | Admitting: Cardiovascular Disease

## 2019-05-07 DIAGNOSIS — R1312 Dysphagia, oropharyngeal phase: Secondary | ICD-10-CM | POA: Diagnosis not present

## 2019-05-07 DIAGNOSIS — R41841 Cognitive communication deficit: Secondary | ICD-10-CM | POA: Diagnosis not present

## 2019-05-07 DIAGNOSIS — N390 Urinary tract infection, site not specified: Secondary | ICD-10-CM | POA: Diagnosis not present

## 2019-05-07 DIAGNOSIS — I5030 Unspecified diastolic (congestive) heart failure: Secondary | ICD-10-CM | POA: Diagnosis not present

## 2019-05-07 DIAGNOSIS — I259 Chronic ischemic heart disease, unspecified: Secondary | ICD-10-CM | POA: Diagnosis not present

## 2019-05-07 DIAGNOSIS — R2681 Unsteadiness on feet: Secondary | ICD-10-CM | POA: Diagnosis not present

## 2019-05-07 DIAGNOSIS — R2689 Other abnormalities of gait and mobility: Secondary | ICD-10-CM | POA: Diagnosis not present

## 2019-05-07 DIAGNOSIS — M6281 Muscle weakness (generalized): Secondary | ICD-10-CM | POA: Diagnosis not present

## 2019-05-07 DIAGNOSIS — G4733 Obstructive sleep apnea (adult) (pediatric): Secondary | ICD-10-CM | POA: Diagnosis not present

## 2019-05-15 DIAGNOSIS — F3489 Other specified persistent mood disorders: Secondary | ICD-10-CM | POA: Diagnosis not present

## 2019-05-15 DIAGNOSIS — G4739 Other sleep apnea: Secondary | ICD-10-CM | POA: Diagnosis not present

## 2019-05-15 DIAGNOSIS — J3089 Other allergic rhinitis: Secondary | ICD-10-CM | POA: Diagnosis not present

## 2019-05-15 DIAGNOSIS — I69391 Dysphagia following cerebral infarction: Secondary | ICD-10-CM | POA: Diagnosis not present

## 2019-05-15 DIAGNOSIS — I6389 Other cerebral infarction: Secondary | ICD-10-CM | POA: Diagnosis not present

## 2019-05-15 DIAGNOSIS — I10 Essential (primary) hypertension: Secondary | ICD-10-CM | POA: Diagnosis not present

## 2019-05-15 DIAGNOSIS — R5381 Other malaise: Secondary | ICD-10-CM | POA: Diagnosis not present

## 2019-05-15 DIAGNOSIS — I2510 Atherosclerotic heart disease of native coronary artery without angina pectoris: Secondary | ICD-10-CM | POA: Diagnosis not present

## 2019-05-15 DIAGNOSIS — I5032 Chronic diastolic (congestive) heart failure: Secondary | ICD-10-CM | POA: Diagnosis not present

## 2019-05-15 DIAGNOSIS — F0150 Vascular dementia without behavioral disturbance: Secondary | ICD-10-CM | POA: Diagnosis not present

## 2019-05-15 DIAGNOSIS — K219 Gastro-esophageal reflux disease without esophagitis: Secondary | ICD-10-CM | POA: Diagnosis not present

## 2019-06-06 DIAGNOSIS — I259 Chronic ischemic heart disease, unspecified: Secondary | ICD-10-CM | POA: Diagnosis not present

## 2019-06-06 DIAGNOSIS — G4733 Obstructive sleep apnea (adult) (pediatric): Secondary | ICD-10-CM | POA: Diagnosis not present

## 2019-06-06 DIAGNOSIS — R2681 Unsteadiness on feet: Secondary | ICD-10-CM | POA: Diagnosis not present

## 2019-06-06 DIAGNOSIS — I5030 Unspecified diastolic (congestive) heart failure: Secondary | ICD-10-CM | POA: Diagnosis not present

## 2019-06-06 DIAGNOSIS — R41841 Cognitive communication deficit: Secondary | ICD-10-CM | POA: Diagnosis not present

## 2019-06-06 DIAGNOSIS — M6281 Muscle weakness (generalized): Secondary | ICD-10-CM | POA: Diagnosis not present

## 2019-06-06 DIAGNOSIS — N390 Urinary tract infection, site not specified: Secondary | ICD-10-CM | POA: Diagnosis not present

## 2019-06-10 DIAGNOSIS — F0150 Vascular dementia without behavioral disturbance: Secondary | ICD-10-CM | POA: Diagnosis not present

## 2019-06-10 DIAGNOSIS — G4719 Other hypersomnia: Secondary | ICD-10-CM | POA: Diagnosis not present

## 2019-06-10 DIAGNOSIS — I2510 Atherosclerotic heart disease of native coronary artery without angina pectoris: Secondary | ICD-10-CM | POA: Diagnosis not present

## 2019-06-10 DIAGNOSIS — I5032 Chronic diastolic (congestive) heart failure: Secondary | ICD-10-CM | POA: Diagnosis not present

## 2019-06-10 DIAGNOSIS — I6389 Other cerebral infarction: Secondary | ICD-10-CM | POA: Diagnosis not present

## 2019-06-10 DIAGNOSIS — F3489 Other specified persistent mood disorders: Secondary | ICD-10-CM | POA: Diagnosis not present

## 2019-06-10 DIAGNOSIS — R5381 Other malaise: Secondary | ICD-10-CM | POA: Diagnosis not present

## 2019-06-17 DIAGNOSIS — R2689 Other abnormalities of gait and mobility: Secondary | ICD-10-CM | POA: Diagnosis not present

## 2019-06-17 DIAGNOSIS — F39 Unspecified mood [affective] disorder: Secondary | ICD-10-CM | POA: Diagnosis not present

## 2019-07-04 DIAGNOSIS — Z1383 Encounter for screening for respiratory disorder NEC: Secondary | ICD-10-CM | POA: Diagnosis not present

## 2019-07-08 DIAGNOSIS — N390 Urinary tract infection, site not specified: Secondary | ICD-10-CM | POA: Diagnosis not present

## 2019-07-08 DIAGNOSIS — G4733 Obstructive sleep apnea (adult) (pediatric): Secondary | ICD-10-CM | POA: Diagnosis not present

## 2019-07-08 DIAGNOSIS — R41841 Cognitive communication deficit: Secondary | ICD-10-CM | POA: Diagnosis not present

## 2019-07-08 DIAGNOSIS — I5030 Unspecified diastolic (congestive) heart failure: Secondary | ICD-10-CM | POA: Diagnosis not present

## 2019-07-08 DIAGNOSIS — I259 Chronic ischemic heart disease, unspecified: Secondary | ICD-10-CM | POA: Diagnosis not present

## 2019-07-08 DIAGNOSIS — M6281 Muscle weakness (generalized): Secondary | ICD-10-CM | POA: Diagnosis not present

## 2019-07-08 DIAGNOSIS — R2681 Unsteadiness on feet: Secondary | ICD-10-CM | POA: Diagnosis not present

## 2019-07-10 DIAGNOSIS — Z1383 Encounter for screening for respiratory disorder NEC: Secondary | ICD-10-CM | POA: Diagnosis not present

## 2019-07-15 DIAGNOSIS — R2689 Other abnormalities of gait and mobility: Secondary | ICD-10-CM | POA: Diagnosis not present

## 2019-07-15 DIAGNOSIS — F0151 Vascular dementia with behavioral disturbance: Secondary | ICD-10-CM | POA: Diagnosis not present

## 2019-07-15 DIAGNOSIS — R4189 Other symptoms and signs involving cognitive functions and awareness: Secondary | ICD-10-CM | POA: Diagnosis not present

## 2019-07-22 DIAGNOSIS — F0150 Vascular dementia without behavioral disturbance: Secondary | ICD-10-CM | POA: Diagnosis not present

## 2019-07-22 DIAGNOSIS — Z20828 Contact with and (suspected) exposure to other viral communicable diseases: Secondary | ICD-10-CM | POA: Diagnosis not present

## 2019-07-22 DIAGNOSIS — E538 Deficiency of other specified B group vitamins: Secondary | ICD-10-CM | POA: Diagnosis not present

## 2019-07-22 DIAGNOSIS — N39498 Other specified urinary incontinence: Secondary | ICD-10-CM | POA: Diagnosis not present

## 2019-07-22 DIAGNOSIS — E876 Hypokalemia: Secondary | ICD-10-CM | POA: Diagnosis not present

## 2019-07-28 DIAGNOSIS — Z20828 Contact with and (suspected) exposure to other viral communicable diseases: Secondary | ICD-10-CM | POA: Diagnosis not present

## 2019-08-05 DIAGNOSIS — Z20828 Contact with and (suspected) exposure to other viral communicable diseases: Secondary | ICD-10-CM | POA: Diagnosis not present

## 2019-08-14 DIAGNOSIS — I10 Essential (primary) hypertension: Secondary | ICD-10-CM | POA: Diagnosis not present

## 2019-08-14 DIAGNOSIS — F0150 Vascular dementia without behavioral disturbance: Secondary | ICD-10-CM | POA: Diagnosis not present

## 2019-08-14 DIAGNOSIS — I5032 Chronic diastolic (congestive) heart failure: Secondary | ICD-10-CM | POA: Diagnosis not present

## 2019-08-14 DIAGNOSIS — I2510 Atherosclerotic heart disease of native coronary artery without angina pectoris: Secondary | ICD-10-CM | POA: Diagnosis not present

## 2019-08-19 DIAGNOSIS — Z20828 Contact with and (suspected) exposure to other viral communicable diseases: Secondary | ICD-10-CM | POA: Diagnosis not present

## 2019-08-22 DIAGNOSIS — Z20828 Contact with and (suspected) exposure to other viral communicable diseases: Secondary | ICD-10-CM | POA: Diagnosis not present

## 2019-08-28 DIAGNOSIS — M6281 Muscle weakness (generalized): Secondary | ICD-10-CM | POA: Diagnosis not present

## 2019-08-28 DIAGNOSIS — I259 Chronic ischemic heart disease, unspecified: Secondary | ICD-10-CM | POA: Diagnosis not present

## 2019-08-28 DIAGNOSIS — I5030 Unspecified diastolic (congestive) heart failure: Secondary | ICD-10-CM | POA: Diagnosis not present

## 2019-08-31 ENCOUNTER — Emergency Department (HOSPITAL_COMMUNITY): Payer: PPO

## 2019-08-31 ENCOUNTER — Inpatient Hospital Stay (HOSPITAL_COMMUNITY)
Admission: EM | Admit: 2019-08-31 | Discharge: 2019-09-09 | DRG: 208 | Disposition: A | Discharge status: Skilled Nursing Facility | Payer: PPO | Source: Skilled Nursing Facility | Attending: Internal Medicine | Admitting: Internal Medicine

## 2019-08-31 DIAGNOSIS — J9600 Acute respiratory failure, unspecified whether with hypoxia or hypercapnia: Secondary | ICD-10-CM | POA: Diagnosis not present

## 2019-08-31 DIAGNOSIS — E785 Hyperlipidemia, unspecified: Secondary | ICD-10-CM | POA: Diagnosis present

## 2019-08-31 DIAGNOSIS — Z8711 Personal history of peptic ulcer disease: Secondary | ICD-10-CM

## 2019-08-31 DIAGNOSIS — Z01818 Encounter for other preprocedural examination: Secondary | ICD-10-CM

## 2019-08-31 DIAGNOSIS — Z7401 Bed confinement status: Secondary | ICD-10-CM | POA: Diagnosis not present

## 2019-08-31 DIAGNOSIS — K219 Gastro-esophageal reflux disease without esophagitis: Secondary | ICD-10-CM | POA: Diagnosis present

## 2019-08-31 DIAGNOSIS — I110 Hypertensive heart disease with heart failure: Secondary | ICD-10-CM | POA: Diagnosis present

## 2019-08-31 DIAGNOSIS — G9341 Metabolic encephalopathy: Secondary | ICD-10-CM | POA: Diagnosis present

## 2019-08-31 DIAGNOSIS — F329 Major depressive disorder, single episode, unspecified: Secondary | ICD-10-CM | POA: Diagnosis present

## 2019-08-31 DIAGNOSIS — M2550 Pain in unspecified joint: Secondary | ICD-10-CM | POA: Diagnosis not present

## 2019-08-31 DIAGNOSIS — Z4682 Encounter for fitting and adjustment of non-vascular catheter: Secondary | ICD-10-CM | POA: Diagnosis not present

## 2019-08-31 DIAGNOSIS — T380X5A Adverse effect of glucocorticoids and synthetic analogues, initial encounter: Secondary | ICD-10-CM | POA: Diagnosis present

## 2019-08-31 DIAGNOSIS — R404 Transient alteration of awareness: Secondary | ICD-10-CM | POA: Diagnosis not present

## 2019-08-31 DIAGNOSIS — J1282 Pneumonia due to coronavirus disease 2019: Secondary | ICD-10-CM | POA: Diagnosis present

## 2019-08-31 DIAGNOSIS — I2510 Atherosclerotic heart disease of native coronary artery without angina pectoris: Secondary | ICD-10-CM | POA: Diagnosis present

## 2019-08-31 DIAGNOSIS — J9601 Acute respiratory failure with hypoxia: Secondary | ICD-10-CM | POA: Diagnosis present

## 2019-08-31 DIAGNOSIS — U071 COVID-19: Principal | ICD-10-CM | POA: Diagnosis present

## 2019-08-31 DIAGNOSIS — Z7982 Long term (current) use of aspirin: Secondary | ICD-10-CM | POA: Diagnosis not present

## 2019-08-31 DIAGNOSIS — R000 Tachycardia, unspecified: Secondary | ICD-10-CM | POA: Diagnosis not present

## 2019-08-31 DIAGNOSIS — G4731 Primary central sleep apnea: Secondary | ICD-10-CM | POA: Diagnosis present

## 2019-08-31 DIAGNOSIS — F0390 Unspecified dementia without behavioral disturbance: Secondary | ICD-10-CM | POA: Diagnosis present

## 2019-08-31 DIAGNOSIS — I5032 Chronic diastolic (congestive) heart failure: Secondary | ICD-10-CM | POA: Diagnosis present

## 2019-08-31 DIAGNOSIS — R739 Hyperglycemia, unspecified: Secondary | ICD-10-CM | POA: Diagnosis present

## 2019-08-31 DIAGNOSIS — N179 Acute kidney failure, unspecified: Secondary | ICD-10-CM | POA: Diagnosis present

## 2019-08-31 DIAGNOSIS — Z66 Do not resuscitate: Secondary | ICD-10-CM | POA: Diagnosis present

## 2019-08-31 DIAGNOSIS — Z8249 Family history of ischemic heart disease and other diseases of the circulatory system: Secondary | ICD-10-CM

## 2019-08-31 DIAGNOSIS — R112 Nausea with vomiting, unspecified: Secondary | ICD-10-CM | POA: Diagnosis present

## 2019-08-31 DIAGNOSIS — Z85828 Personal history of other malignant neoplasm of skin: Secondary | ICD-10-CM

## 2019-08-31 DIAGNOSIS — Z79890 Hormone replacement therapy (postmenopausal): Secondary | ICD-10-CM

## 2019-08-31 DIAGNOSIS — R0689 Other abnormalities of breathing: Secondary | ICD-10-CM | POA: Diagnosis not present

## 2019-08-31 DIAGNOSIS — I252 Old myocardial infarction: Secondary | ICD-10-CM

## 2019-08-31 DIAGNOSIS — Z7902 Long term (current) use of antithrombotics/antiplatelets: Secondary | ICD-10-CM | POA: Diagnosis not present

## 2019-08-31 DIAGNOSIS — R0902 Hypoxemia: Secondary | ICD-10-CM | POA: Diagnosis not present

## 2019-08-31 DIAGNOSIS — E861 Hypovolemia: Secondary | ICD-10-CM | POA: Diagnosis present

## 2019-08-31 DIAGNOSIS — Z86711 Personal history of pulmonary embolism: Secondary | ICD-10-CM

## 2019-08-31 DIAGNOSIS — J9690 Respiratory failure, unspecified, unspecified whether with hypoxia or hypercapnia: Secondary | ICD-10-CM | POA: Diagnosis present

## 2019-08-31 DIAGNOSIS — G4733 Obstructive sleep apnea (adult) (pediatric): Secondary | ICD-10-CM | POA: Diagnosis present

## 2019-08-31 DIAGNOSIS — Z955 Presence of coronary angioplasty implant and graft: Secondary | ICD-10-CM

## 2019-08-31 DIAGNOSIS — Z79899 Other long term (current) drug therapy: Secondary | ICD-10-CM

## 2019-08-31 DIAGNOSIS — E876 Hypokalemia: Secondary | ICD-10-CM | POA: Diagnosis present

## 2019-08-31 DIAGNOSIS — Z881 Allergy status to other antibiotic agents status: Secondary | ICD-10-CM

## 2019-08-31 DIAGNOSIS — J80 Acute respiratory distress syndrome: Secondary | ICD-10-CM | POA: Diagnosis not present

## 2019-08-31 DIAGNOSIS — J690 Pneumonitis due to inhalation of food and vomit: Secondary | ICD-10-CM | POA: Diagnosis present

## 2019-08-31 DIAGNOSIS — R918 Other nonspecific abnormal finding of lung field: Secondary | ICD-10-CM | POA: Diagnosis not present

## 2019-08-31 LAB — CBC WITH DIFFERENTIAL/PLATELET
Abs Immature Granulocytes: 0.01 10*3/uL (ref 0.00–0.07)
Basophils Absolute: 0 10*3/uL (ref 0.0–0.1)
Basophils Relative: 1 %
Eosinophils Absolute: 0 10*3/uL (ref 0.0–0.5)
Eosinophils Relative: 0 %
HCT: 50.2 % (ref 39.0–52.0)
Hemoglobin: 16.5 g/dL (ref 13.0–17.0)
Immature Granulocytes: 0 %
Lymphocytes Relative: 27 %
Lymphs Abs: 1.1 10*3/uL (ref 0.7–4.0)
MCH: 31.5 pg (ref 26.0–34.0)
MCHC: 32.9 g/dL (ref 30.0–36.0)
MCV: 96 fL (ref 80.0–100.0)
Monocytes Absolute: 0.2 10*3/uL (ref 0.1–1.0)
Monocytes Relative: 4 %
Neutro Abs: 2.7 10*3/uL (ref 1.7–7.7)
Neutrophils Relative %: 68 %
Platelets: 197 10*3/uL (ref 150–400)
RBC: 5.23 MIL/uL (ref 4.22–5.81)
RDW: 14.6 % (ref 11.5–15.5)
WBC: 3.9 10*3/uL — ABNORMAL LOW (ref 4.0–10.5)
nRBC: 0 % (ref 0.0–0.2)

## 2019-08-31 LAB — COMPREHENSIVE METABOLIC PANEL
ALT: 36 U/L (ref 0–44)
AST: 45 U/L — ABNORMAL HIGH (ref 15–41)
Albumin: 3.2 g/dL — ABNORMAL LOW (ref 3.5–5.0)
Alkaline Phosphatase: 94 U/L (ref 38–126)
Anion gap: 11 (ref 5–15)
BUN: 15 mg/dL (ref 8–23)
CO2: 25 mmol/L (ref 22–32)
Calcium: 8.5 mg/dL — ABNORMAL LOW (ref 8.9–10.3)
Chloride: 103 mmol/L (ref 98–111)
Creatinine, Ser: 1.08 mg/dL (ref 0.61–1.24)
GFR calc Af Amer: >60 mL/min (ref >60)
GFR calc non Af Amer: >60 mL/min (ref >60)
Glucose, Bld: 245 mg/dL — ABNORMAL HIGH (ref 70–99)
Potassium: 3.4 mmol/L — ABNORMAL LOW (ref 3.5–5.1)
Sodium: 139 mmol/L (ref 135–145)
Total Bilirubin: 0.4 mg/dL (ref 0.3–1.2)
Total Protein: 6.9 g/dL (ref 6.5–8.1)

## 2019-08-31 LAB — BRAIN NATRIURETIC PEPTIDE: B Natriuretic Peptide: 91.7 pg/mL (ref 0.0–100.0)

## 2019-08-31 LAB — PROTIME-INR
INR: 1.2 (ref 0.8–1.2)
Prothrombin Time: 14.7 seconds (ref 11.4–15.2)

## 2019-08-31 LAB — POC SARS CORONAVIRUS 2 AG -  ED: SARS Coronavirus 2 Ag: NEGATIVE

## 2019-08-31 LAB — LACTIC ACID, PLASMA: Lactic Acid, Venous: 3.4 mmol/L (ref 0.5–1.9)

## 2019-08-31 IMAGING — DX DG CHEST 1V PORT
1 series · 1 of 1 positions shown · non-contrast
Comparison: Radiograph [DATE]

CLINICAL DATA: Post intubation

EXAM:
PORTABLE CHEST 1 VIEW

[chest]
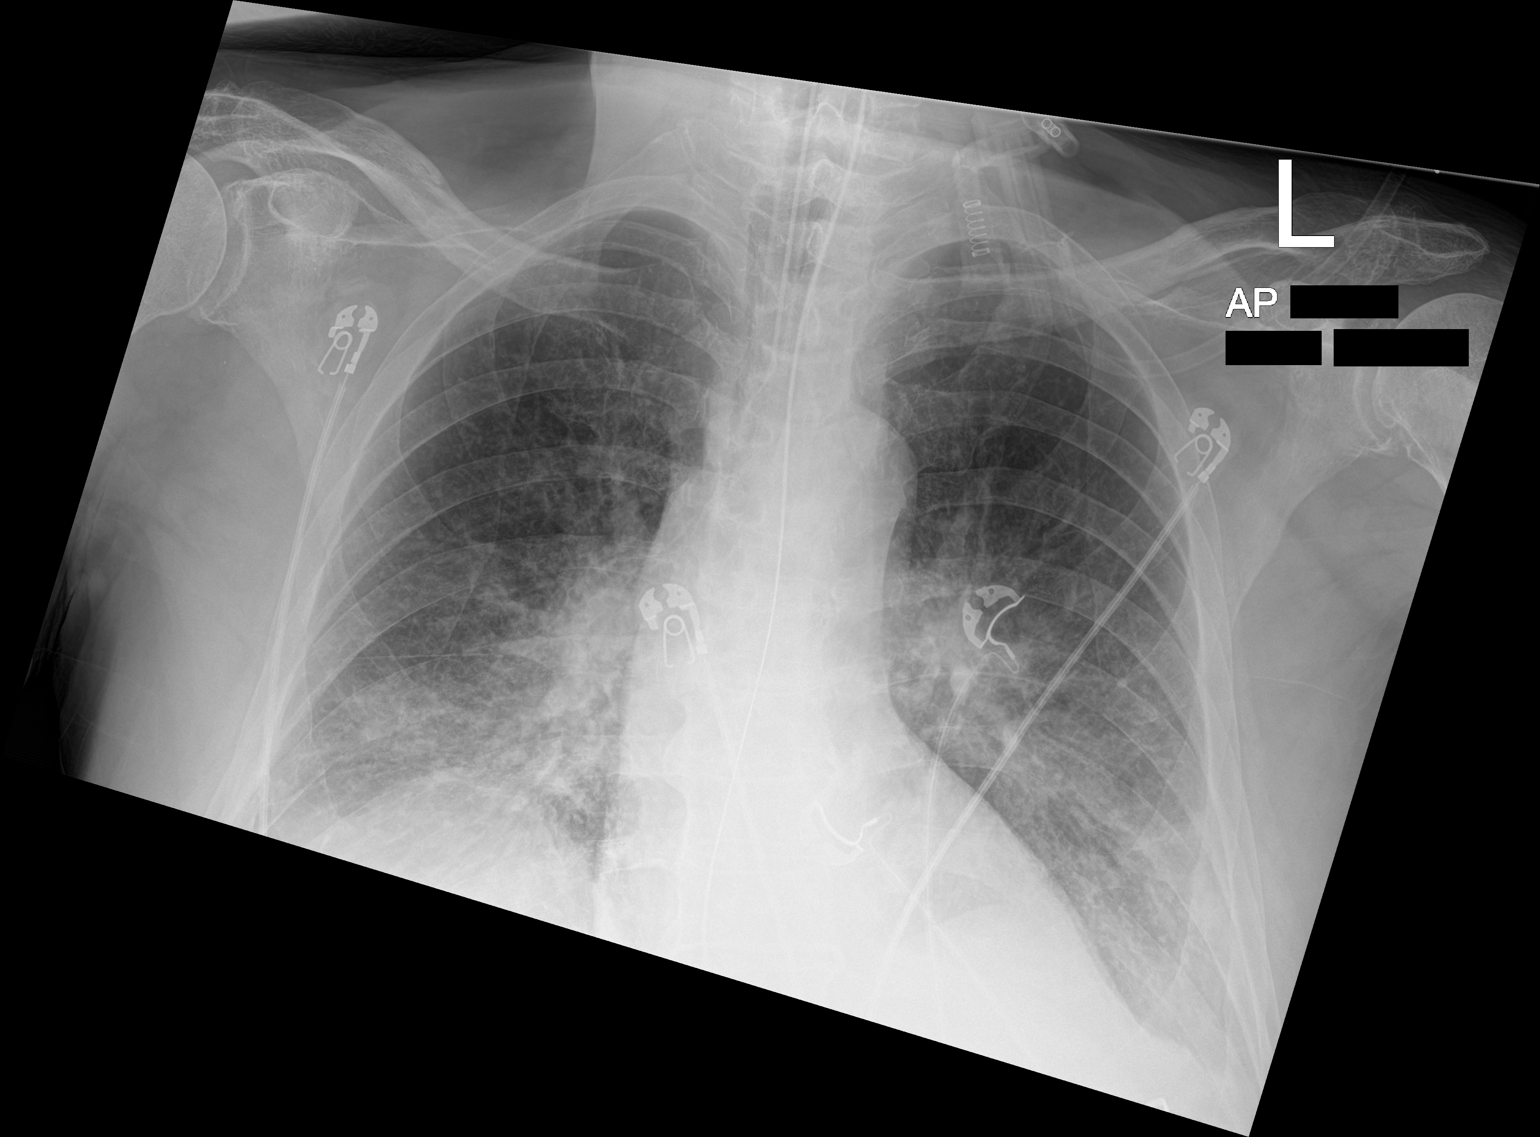

[1 of 1 positions shown; findings below may reference images not displayed]

FINDINGS: Endotracheal tube terminates in the mid trachea approximately 5 cm
from the level of the carina. A transesophageal tube tip and side
port are below the level of imaging, at or below the GE junction.
Telemetry leads overlie the chest.

There are dense patchy perihilar and basilar airspace opacities with
mixed hazy interstitial opacity more pronounced in the right lung
than left. No visible pneumothorax or effusion the portion of the
costophrenic sulci are collimated. Included cardiomediastinal
contours are unremarkable. No acute osseous or soft tissue
abnormality. Degenerative changes are present in the imaged spine
and shoulders.
IMPRESSION: 1. Endotracheal tube 5 cm from the level of the carina.
2. Transesophageal tip and side port below the level of imaging, at
or below the GE junction.
3. Dense patchy perihilar and basilar airspace opacities with mixed
hazy interstitial opacity. Findings may reflect multifocal pneumonia
or edema.

## 2019-08-31 MED ORDER — SODIUM CHLORIDE 0.9 % IV SOLN
2.0000 g | Freq: Once | INTRAVENOUS | Status: AC
  Administered 2019-08-31: 2 g via INTRAVENOUS
  Filled 2019-08-31: qty 2

## 2019-08-31 MED ORDER — VANCOMYCIN HCL 2000 MG/400ML IV SOLN
2000.0000 mg | Freq: Once | INTRAVENOUS | Status: AC
  Administered 2019-09-01: 2000 mg via INTRAVENOUS
  Filled 2019-08-31: qty 400

## 2019-08-31 MED ORDER — PROPOFOL 1000 MG/100ML IV EMUL
INTRAVENOUS | Status: AC
  Administered 2019-08-31: 23:00:00 50 ug via INTRAVENOUS
  Filled 2019-08-31: qty 100

## 2019-08-31 MED ORDER — SODIUM CHLORIDE 0.9 % IV SOLN
3.0000 g | Freq: Once | INTRAVENOUS | Status: AC
  Administered 2019-08-31: 3 g via INTRAVENOUS
  Filled 2019-08-31: qty 8

## 2019-08-31 MED ORDER — PROPOFOL 1000 MG/100ML IV EMUL
5.0000 ug/kg/min | INTRAVENOUS | Status: DC
  Administered 2019-08-31: 10.096 ug/kg/min via INTRAVENOUS
  Administered 2019-09-01: 25 ug/kg/min via INTRAVENOUS
  Administered 2019-09-01: 35 ug/kg/min via INTRAVENOUS
  Filled 2019-08-31 (×2): qty 100

## 2019-08-31 MED ORDER — ACETAMINOPHEN 650 MG RE SUPP
650.0000 mg | Freq: Once | RECTAL | Status: AC
  Administered 2019-09-01: 650 mg via RECTAL
  Filled 2019-08-31: qty 1

## 2019-08-31 MED ORDER — PROPOFOL 1000 MG/100ML IV EMUL: 5.0000 ug/kg/min | INTRAVENOUS | Status: DC

## 2019-08-31 MED ORDER — ROCURONIUM BROMIDE 50 MG/5ML IV SOLN
INTRAVENOUS | Status: AC | PRN
  Administered 2019-08-31: 100 mg via INTRAVENOUS

## 2019-08-31 MED ORDER — ETOMIDATE 2 MG/ML IV SOLN
INTRAVENOUS | Status: AC | PRN
  Administered 2019-08-31: 30 mg via INTRAVENOUS

## 2019-08-31 NOTE — ED Provider Notes (Signed)
Medical screening examination/treatment/procedure(s) were conducted as a shared visit with non-physician practitioner(s) and myself.  I personally evaluated the patient during the encounter.  I directly supervised and was present for the intubation.  EKG Interpretation  Date/Time:  Saturday August 31 2019 22:45:57 EST Ventricular Rate:  120 PR Interval:    QRS Duration: 113 QT Interval:  343 QTC Calculation: 485 R Axis:   74 Text Interpretation: Sinus tachycardia Ventricular trigeminy Consider right atrial enlargement Inferior infarct, old Abnormal lateral Q waves Poor data quality in current ECG precludes serial comparison Confirmed by Sherwood Gambler [phone]) on 08/31/2019 11:37:47 PM    Patient presents with respiratory failure.  History is very limited.  Apparently vomited and concern for aspiration.  EMS initially placed on CPAP but due to mental status had to start bagging him.  Here, we had to continue this support and thus he needed intubation.  No indication of DO NOT RESUSCITATE in the chart or coming with patient.  Patient will be given broad IV antibiotics and work-up for Covid.  CRITICAL CARE Performed by: Ephraim Hamburger   Total critical care time: 30 minutes  Critical care time was exclusive of separately billable procedures and treating other patients.  Critical care was necessary to treat or prevent imminent or life-threatening deterioration.  Critical care was time spent personally by me on the following activities: development of treatment plan with patient and/or surrogate as well as nursing, discussions with consultants, evaluation of patient's response to treatment, examination of patient, obtaining history from patient or surrogate, ordering and performing treatments and interventions, ordering and review of laboratory studies, ordering and review of radiographic studies, pulse oximetry and re-evaluation of patient's condition.    Sherwood Gambler, MD 08/31/19  [phone]

## 2019-08-31 NOTE — ED Provider Notes (Signed)
[facility] EMERGENCY DEPARTMENT Provider Note   CSN: [id number] Arrival date & time: 08/31/19  2230     History Chief Complaint  Patient presents with  . Shortness of Breath    John Blackburn is a 73 y.o. male.  Patient presents to the emergency department with a chief complaint of shortness of breath.  He has from SNF.  Son reports that he began running a fever today with associated wheezing, vomiting, and diarrhea.  Per EMS, the patient lost consciousness while being transported to the hospital.  He was requiring CPAP.  Oxygen level dropped to 70% in route, and patient was then bagged by EMS.  Oxygen level improved to the high 80s.  Level 5 caveat applies secondary to acuity of condition.  The history is provided by the EMS personnel and a relative. No language interpreter was used.       Past Medical History:  Diagnosis Date  . Allergy   . 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 CAD (coronary artery disease)    08/2017 PCI/DES mRCA, RPDA, CTO pf pLCX with collaterals, normal EF  . Depression    on multiple meds. Has been seen at [facility]. and Dr. Sabra Heck is his prescriber  . Diverticulitis of colon 2000 's   treated as an outpatient  . GERD (gastroesophageal reflux disease)    UGI done August '12 - ulcer/. Dr. Ferdinand Lango, gastroenterologist in [facility].   [name] Hx of adenomatous colonic polyps   . Hyperlipidemia    last lipid panel: HDL 44, LDL 117  . Hypertension   . Myocardial infarction ([place])   . Peptic ulcer    in the past and just recently-August '12  . Pulmonary emboli [place])    noted October 2014 - treated by Dr. Linda Hedges  . skin cancer    skin CA  . Sleep apnea, primary central    wears CPAP    Patient Active Problem List   Diagnosis Date Noted  . Weakness generalized 03/04/2019  . CAD (coronary artery disease) 03/04/2019  . Generalized weakness 03/04/2019  . Vascular dementia ([place]) 07/03/2018  . ARF (acute renal  failure) ([place]) 04/23/2018  . Hypokalemia 04/23/2018  . Anorexia 04/23/2018  . Leucocytosis 04/23/2018  . EKG abnormalities 04/23/2018  . B12 deficiency 04/12/2018  . Memory loss 11/28/2017  . Balance problem 11/28/2017  . skin cancer   . Sleep apnea, primary central   . GERD (gastroesophageal reflux disease)   . MDD (major depressive disorder), single episode, moderate ([place])   . CAD S/P percutaneous coronary angioplasty 09/02/2017  . Chronic diastolic (congestive) heart failure ([place]) 08/30/2017  . Elevated blood sugar 01/17/2017  . Incontinence of feces   . Allergic rhinitis 10/15/2013  . Chronic venous insufficiency 07/20/2013  . History of pulmonary embolism 07/08/2013  . ED (erectile dysfunction) 06/01/2011  . Arthritis   . Hypertension   . Hyperlipidemia   . Obstructive sleep apnea     Past Surgical History:  Procedure Laterality Date  . ANAL RECTAL MANOMETRY N/A 11/30/2016   Procedure: ANO RECTAL MANOMETRY;  Surgeon: Mauri Pole, MD;  Location: [facility];  Service: Endoscopy;  Laterality: N/A;  . CARDIAC CATHETERIZATION    . CHOLECYSTECTOMY  1990   laproscopic   . CORONARY STENT INTERVENTION N/A 09/01/2017   Procedure: CORONARY STENT INTERVENTION;  Surgeon: Jettie Booze, MD;  Location: [facility] CV LAB;  Service: Cardiovascular;  Laterality: N/A;  . HERNIA  REPAIR    . JOINT REPLACEMENT     knee  . PROSTATE SURGERY     needle biopsy's, TURP  . RIGHT/LEFT HEART CATH AND CORONARY ANGIOGRAPHY N/A 09/01/2017   Procedure: RIGHT/LEFT HEART CATH AND CORONARY ANGIOGRAPHY;  Surgeon: Jettie Booze, MD;  Location: [facility] LAB;  Service: Cardiovascular;  Laterality: N/A;  . TKR bilateral  2006   Dr. Violet Baldy  . UPPER GASTROINTESTINAL ENDOSCOPY    . VASECTOMY         Family History  Problem Relation Age of Onset  . Stroke Mother   . Hypertension Mother   . Heart disease Mother   . Heart disease Father        CAD/MI-fatal sudden  death  . Colon cancer Father   . Colon cancer Sister        colon cancer 20090-survivor  . Heart disease Paternal Uncle   . Colon cancer Paternal Uncle   . Heart disease Paternal Grandfather   . Colon cancer Paternal Aunt   . Colon cancer Cousin   . Colon cancer Cousin   . Colon cancer Paternal Uncle   . Stomach cancer Neg Hx   . Rectal cancer Neg Hx   . Esophageal cancer Neg Hx     Social History   Tobacco Use  . Smoking status: Never Smoker  . Smokeless tobacco: Never Used  Substance Use Topics  . Alcohol use: Yes    Alcohol/week: 1.0 standard drinks    Types: 1 Standard drinks or equivalent per week    Comment: social  . Drug use: No    Home Medications Prior to Admission medications   Medication Sig Start Date End Date Taking? Authorizing Provider  atorvastatin (LIPITOR) 40 MG tablet Take 40 mg by mouth daily at 6 PM.   Yes [provider]  loratadine (CLARITIN) 10 MG tablet Take 1 tablet (10 mg total) by mouth daily as needed for allergies. 03/06/19  Yes Georgette Shell, MD  Melatonin 3 MG TABS Take 3 mg by mouth at bedtime.   Yes [provider]  nitroGLYCERIN (NITROSTAT) 0.4 MG SL tablet Place 1 tablet (0.4 mg total) under the tongue every 5 (five) minutes as needed. Patient taking differently: Place 0.4 mg under the tongue every 5 (five) minutes x 2 doses as needed for chest pain.  09/04/17  Yes Reino Bellis B, NP  polyethylene glycol powder (GLYCOLAX/MIRALAX) 17 GM/SCOOP powder Take 17 g by mouth daily. MIX INTO 4-8 OUNCES OF LIQUID   Yes [provider]  PRESCRIPTION MEDICATION See admin instructions. CPAP- At bedtime (remove each morning)   Yes [provider]  senna-docusate (SENOKOT-S) 8.6-50 MG tablet Take 1 tablet by mouth 2 (two) times daily as needed for mild constipation. Patient taking differently: Take 1 tablet by mouth at bedtime.  03/21/19  Yes Shelly Coss, MD  amLODipine (NORVASC) 5 MG tablet Take 1 tablet  (5 mg total) by mouth daily. 03/21/19 03/20/20  Shelly Coss, MD  aspirin EC 81 MG EC tablet Take 1 tablet (81 mg total) by mouth daily. 09/05/17   Cheryln Manly, NP  atorvastatin (LIPITOR) 80 MG tablet Take 1 tablet (80 mg total) by mouth daily at 6 PM. KEEP OV. Patient not taking: Reported on 08/31/2019 02/25/19   Lorretta Harp, MD  buPROPion (WELLBUTRIN XL) 300 MG 24 hr tablet TAKE 1 TABLET BY MOUTH ONCE DAILY Patient taking differently: Take 300 mg by mouth daily.  06/11/18   Sharlet Salina,  Real Cons, MD  clopidogrel (PLAVIX) 75 MG tablet Take 1 tablet (75 mg total) by mouth daily. Patient will be scheduled for AUGUST 2020. 12/07/18   Lorretta Harp, MD  escitalopram (LEXAPRO) 10 MG tablet Take 1 tablet by mouth once daily Patient taking differently: Take 10 mg by mouth daily.  11/01/18   Hoyt Koch, MD  memantine (NAMENDA XR) 7 MG CP24 24 hr capsule TAKE 1  BY MOUTH ONCE DAILY Patient taking differently: Take 7 mg by mouth daily.  02/11/19   Hoyt Koch, MD  pantoprazole (PROTONIX) 40 MG tablet Take 1 tablet (40 mg total) by mouth daily. Patient will be scheduled for AUGUST 2020. 12/07/18   Lorretta Harp, MD    Allergies    Cephalexin and Levofloxacin  Review of Systems   Review of Systems  Unable to perform ROS: Acuity of condition    Physical Exam Updated Vital Signs BP (!) 146/88   Pulse (!) 120   Resp (!) 22   Ht 5\' 11"  (1.803 m)   Wt 100.7 kg Comment: from June 2020 records, please update  SpO2 92%   BMI 30.96 kg/m   Physical Exam Vitals and nursing note reviewed.  Constitutional:      General: He is in acute distress.     Appearance: He is well-developed. He is ill-appearing.  HENT:     Head: Normocephalic and atraumatic.  Eyes:     Conjunctiva/sclera: Conjunctivae normal.  Cardiovascular:     Rate and Rhythm: Regular rhythm. Tachycardia present.     Heart sounds: No murmur.  Pulmonary:     Effort: Respiratory distress present.      Comments: Requiring BVM ventilation, tachypneic Abdominal:     General: There is no distension.     Palpations: Abdomen is soft.  Musculoskeletal:     Cervical back: Neck supple.  Skin:    General: Skin is warm and dry.  Neurological:     Comments: GCS 6 E2V1M3  Psychiatric:     Comments: Unable to assess     ED Results / Procedures / Treatments   Labs (all labs ordered are listed, but only abnormal results are displayed) Labs Reviewed  RESPIRATORY PANEL BY RT PCR (FLU A&B, COVID) - Abnormal; Notable for the following components:      Result Value   SARS Coronavirus 2 by RT PCR POSITIVE (*)    All other components within normal limits  COMPREHENSIVE METABOLIC PANEL - Abnormal; Notable for the following components:   Potassium 3.4 (*)    Glucose, Bld 245 (*)    Calcium 8.5 (*)    Albumin 3.2 (*)    AST 45 (*)    All other components within normal limits  LACTIC ACID, PLASMA - Abnormal; Notable for the following components:   Lactic Acid, Venous 3.4 (*)    All other components within normal limits  CBC WITH DIFFERENTIAL/PLATELET - Abnormal; Notable for the following components:   WBC 3.9 (*)    All other components within normal limits  URINALYSIS, ROUTINE W REFLEX MICROSCOPIC - Abnormal; Notable for the following components:   APPearance HAZY (*)    Specific Gravity, Urine 1.031 (*)    Protein, ur 30 (*)    Leukocytes,Ua TRACE (*)    Bacteria, UA RARE (*)    All other components within normal limits  POCT I-STAT 7, (LYTES, BLD GAS, ICA,H+H) - Abnormal; Notable for the following components:   pO2, Arterial 139.0 (*)  Potassium 3.0 (*)    All other components within normal limits  CULTURE, BLOOD (ROUTINE X 2)  CULTURE, BLOOD (ROUTINE X 2)  URINE CULTURE  PROTIME-INR  BRAIN NATRIURETIC PEPTIDE  LACTIC ACID, PLASMA  APTT  BLOOD GAS, ARTERIAL  CBC  CREATININE, SERUM  CBC  BASIC METABOLIC PANEL  MAGNESIUM  PHOSPHORUS  I-STAT ARTERIAL BLOOD GAS, ED  POC SARS  CORONAVIRUS 2 AG -  ED    EKG EKG Interpretation  Date/Time:  Saturday August 31 2019 22:45:57 EST Ventricular Rate:  120 PR Interval:    QRS Duration: 113 QT Interval:  343 QTC Calculation: 485 R Axis:   74 Text Interpretation: Sinus tachycardia Ventricular trigeminy Consider right atrial enlargement Inferior infarct, old Abnormal lateral Q waves Poor data quality in current ECG precludes serial comparison Confirmed by Sherwood Gambler [phone]) on 08/31/2019 11:37:47 PM   Radiology DG Chest Port 1 View  Result Date: 08/31/2019 CLINICAL DATA:  Post intubation EXAM: PORTABLE CHEST 1 VIEW COMPARISON:  Radiograph 03/15/2019 FINDINGS: Endotracheal tube terminates in the mid trachea approximately 5 cm from the level of the carina. A transesophageal tube tip and side port are below the level of imaging, at or below the GE junction. Telemetry leads overlie the chest. There are dense patchy perihilar and basilar airspace opacities with mixed hazy interstitial opacity more pronounced in the right lung than left. No visible pneumothorax or effusion the portion of the costophrenic sulci are collimated. Included cardiomediastinal contours are unremarkable. No acute osseous or soft tissue abnormality. Degenerative changes are present in the imaged spine and shoulders. IMPRESSION: 1. Endotracheal tube 5 cm from the level of the carina. 2. Transesophageal tip and side port below the level of imaging, at or below the GE junction. 3. Dense patchy perihilar and basilar airspace opacities with mixed hazy interstitial opacity. Findings may reflect multifocal pneumonia or edema. Electronically Signed   By: Lovena Le M.D.   On: 08/31/2019 23:06    Procedures .Critical Care Performed by: Montine Circle, PA-C Authorized by: Montine Circle, PA-C   Critical care provider statement:    Critical care time (minutes):  43   Critical care was necessary to treat or prevent imminent or life-threatening  deterioration of the following conditions:  Respiratory failure   Critical care was time spent personally by me on the following activities:  Discussions with consultants, evaluation of patient's response to treatment, examination of patient, ordering and performing treatments and interventions, ordering and review of laboratory studies, ordering and review of radiographic studies, pulse oximetry, re-evaluation of patient's condition, obtaining history from patient or surrogate and review of old charts Procedure Name: Intubation Date/Time: 08/31/2019 11:28 PM Performed by: Montine Circle, PA-C Pre-anesthesia Checklist: Patient identified, Patient being monitored, Emergency Drugs available, Timeout performed and Suction available Oxygen Delivery Method: Ambu bag Preoxygenation: Pre-oxygenation with 100% oxygen Induction Type: Rapid sequence Ventilation: Mask ventilation without difficulty Laryngoscope Size: Glidescope and 3 Tube size: 7.5 mm Number of attempts: 1 Airway Equipment and Method: Video-laryngoscopy Placement Confirmation: ETT inserted through vocal cords under direct vision,  CO2 detector and Breath sounds checked- equal and bilateral Secured at: 24 cm Tube secured with: ETT holder Dental Injury: Teeth and Oropharynx as per pre-operative assessment       (including critical care time)  Medications Ordered in ED Medications  etomidate (AMIDATE) injection (30 mg Intravenous Given 08/31/19 2237)  rocuronium (ZEMURON) injection (100 mg Intravenous Given 08/31/19 2238)  propofol (DIPRIVAN) 1000 MG/100ML infusion (10.096 mcg/kg/min  100.7 kg Intravenous  New Bag/Given 08/31/19 2254)  aztreonam (AZACTAM) 2 g in sodium chloride 0.9 % 100 mL IVPB (has no administration in time range)  vancomycin (VANCOREADY) IVPB 2000 mg/400 mL (has no administration in time range)  Ampicillin-Sulbactam (UNASYN) 3 g in sodium chloride 0.9 % 100 mL IVPB (has no administration in time range)    ED  Course  I have reviewed the triage vital signs and the nursing notes.  Pertinent labs & imaging results that were available during my care of the patient were reviewed by me and considered in my medical decision making (see chart for details).    MDM Rules/Calculators/A&P                      Patient brought to emergency department from SNF for shortness of breath.  He was requiring CPAP by EMS, and ultimately became unresponsive.  He was then ventilated with bag valve mask.  Upon his arrival in the emergency department, the decision was made to intubate the patient.  There is some concern for aspiration, he did have vomiting and diarrhea prior to EMS being called out.  He was noted to be febrile by the nursing home.  Reported Covid negative, but no records available in epic.  Patient was intubated by me, Dr. Regenia Skeeter was present for the entire procedure.   Code sepsis activated.  Patient getting broad-spectrum antibiotics to cover for aspiration pneumonia as well.  Patient is not hypotensive.  Lactate is still pending.  I contacted the patient [name], who informs me that the patient is DNR.  There was no advanced directives or CODE STATUS available indicating DNR prior to the patient being intubated.  The patient's son is satisfied with the patient being intubated and receiving antibiotics, but does not wish the patient to have any other heroic measures.  Case discussed with intensivist, who will have the rounding team see the patient.  Covid positive.   Final Clinical Impression(s) / ED Diagnoses Final diagnoses:  Encounter for intubation    Rx / DC Orders ED Discharge Orders    None       Montine Circle, PA-C 09/01/19 0050    Sherwood Gambler, MD 09/01/19 1505

## 2019-08-31 NOTE — ED Triage Notes (Signed)
Pateint came in [place] from SNF. Possible Aspiration. Started on CPAP, LOC in route. O2 stayed around 70%. Started bagging the patient and O2 increased to 89% 20G LAC.   2mg  Mag 4mg  Zofran 125mg  Solu-Med 10/2.5 Duo NEB

## 2019-09-01 ENCOUNTER — Inpatient Hospital Stay (HOSPITAL_COMMUNITY): Payer: PPO

## 2019-09-01 DIAGNOSIS — E876 Hypokalemia: Secondary | ICD-10-CM | POA: Diagnosis present

## 2019-09-01 DIAGNOSIS — T380X5A Adverse effect of glucocorticoids and synthetic analogues, initial encounter: Secondary | ICD-10-CM | POA: Diagnosis present

## 2019-09-01 DIAGNOSIS — J690 Pneumonitis due to inhalation of food and vomit: Secondary | ICD-10-CM | POA: Diagnosis present

## 2019-09-01 DIAGNOSIS — J9690 Respiratory failure, unspecified, unspecified whether with hypoxia or hypercapnia: Secondary | ICD-10-CM | POA: Diagnosis present

## 2019-09-01 DIAGNOSIS — E785 Hyperlipidemia, unspecified: Secondary | ICD-10-CM | POA: Diagnosis present

## 2019-09-01 DIAGNOSIS — K219 Gastro-esophageal reflux disease without esophagitis: Secondary | ICD-10-CM | POA: Diagnosis present

## 2019-09-01 DIAGNOSIS — R739 Hyperglycemia, unspecified: Secondary | ICD-10-CM | POA: Diagnosis present

## 2019-09-01 DIAGNOSIS — I252 Old myocardial infarction: Secondary | ICD-10-CM | POA: Diagnosis not present

## 2019-09-01 DIAGNOSIS — G4731 Primary central sleep apnea: Secondary | ICD-10-CM | POA: Diagnosis present

## 2019-09-01 DIAGNOSIS — I5032 Chronic diastolic (congestive) heart failure: Secondary | ICD-10-CM | POA: Diagnosis present

## 2019-09-01 DIAGNOSIS — J9601 Acute respiratory failure with hypoxia: Secondary | ICD-10-CM | POA: Diagnosis present

## 2019-09-01 DIAGNOSIS — Z7902 Long term (current) use of antithrombotics/antiplatelets: Secondary | ICD-10-CM | POA: Diagnosis not present

## 2019-09-01 DIAGNOSIS — F0390 Unspecified dementia without behavioral disturbance: Secondary | ICD-10-CM | POA: Diagnosis present

## 2019-09-01 DIAGNOSIS — U071 COVID-19: Secondary | ICD-10-CM | POA: Diagnosis present

## 2019-09-01 DIAGNOSIS — G9341 Metabolic encephalopathy: Secondary | ICD-10-CM | POA: Diagnosis present

## 2019-09-01 DIAGNOSIS — G4733 Obstructive sleep apnea (adult) (pediatric): Secondary | ICD-10-CM | POA: Diagnosis present

## 2019-09-01 DIAGNOSIS — J1282 Pneumonia due to coronavirus disease 2019: Secondary | ICD-10-CM | POA: Diagnosis present

## 2019-09-01 DIAGNOSIS — I110 Hypertensive heart disease with heart failure: Secondary | ICD-10-CM | POA: Diagnosis present

## 2019-09-01 DIAGNOSIS — F329 Major depressive disorder, single episode, unspecified: Secondary | ICD-10-CM | POA: Diagnosis present

## 2019-09-01 DIAGNOSIS — R112 Nausea with vomiting, unspecified: Secondary | ICD-10-CM | POA: Diagnosis present

## 2019-09-01 DIAGNOSIS — Z7982 Long term (current) use of aspirin: Secondary | ICD-10-CM | POA: Diagnosis not present

## 2019-09-01 DIAGNOSIS — E861 Hypovolemia: Secondary | ICD-10-CM | POA: Diagnosis present

## 2019-09-01 DIAGNOSIS — I2510 Atherosclerotic heart disease of native coronary artery without angina pectoris: Secondary | ICD-10-CM | POA: Diagnosis present

## 2019-09-01 DIAGNOSIS — Z66 Do not resuscitate: Secondary | ICD-10-CM | POA: Diagnosis present

## 2019-09-01 DIAGNOSIS — N179 Acute kidney failure, unspecified: Secondary | ICD-10-CM | POA: Diagnosis present

## 2019-09-01 LAB — ABO/RH: ABO/RH(D): O POS

## 2019-09-01 LAB — GLUCOSE, CAPILLARY
Glucose-Capillary: 192 mg/dL — ABNORMAL HIGH (ref 70–99)
Glucose-Capillary: 204 mg/dL — ABNORMAL HIGH (ref 70–99)
Glucose-Capillary: 183 mg/dL — ABNORMAL HIGH (ref 70–99)
Glucose-Capillary: 141 mg/dL — ABNORMAL HIGH (ref 70–99)
Glucose-Capillary: 158 mg/dL — ABNORMAL HIGH (ref 70–99)
Glucose-Capillary: 127 mg/dL — ABNORMAL HIGH (ref 70–99)

## 2019-09-01 LAB — URINALYSIS, ROUTINE W REFLEX MICROSCOPIC
Bilirubin Urine: NEGATIVE
Glucose, UA: NEGATIVE mg/dL
Hgb urine dipstick: NEGATIVE
Ketones, ur: NEGATIVE mg/dL
Nitrite: NEGATIVE
Protein, ur: 30 mg/dL — AB
Specific Gravity, Urine: 1.031 — ABNORMAL HIGH (ref 1.005–1.030)
pH: 5 (ref 5.0–8.0)

## 2019-09-01 LAB — PHOSPHORUS
Phosphorus: 4.6 mg/dL (ref 2.5–4.6)
Phosphorus: 4.8 mg/dL — ABNORMAL HIGH (ref 2.5–4.6)
Phosphorus: 3.8 mg/dL (ref 2.5–4.6)

## 2019-09-01 LAB — CBC
HCT: 46.9 % (ref 39.0–52.0)
Hemoglobin: 16.1 g/dL (ref 13.0–17.0)
MCH: 32.2 pg (ref 26.0–34.0)
MCHC: 34.3 g/dL (ref 30.0–36.0)
MCV: 93.8 fL (ref 80.0–100.0)
Platelets: 189 10*3/uL (ref 150–400)
RBC: 5 MIL/uL (ref 4.22–5.81)
RDW: 14.8 % (ref 11.5–15.5)
WBC: 11.3 10*3/uL — ABNORMAL HIGH (ref 4.0–10.5)
nRBC: 0 % (ref 0.0–0.2)

## 2019-09-01 LAB — BASIC METABOLIC PANEL
Anion gap: 12 (ref 5–15)
BUN: 19 mg/dL (ref 8–23)
CO2: 20 mmol/L — ABNORMAL LOW (ref 22–32)
Calcium: 8.4 mg/dL — ABNORMAL LOW (ref 8.9–10.3)
Chloride: 108 mmol/L (ref 98–111)
Creatinine, Ser: 0.95 mg/dL (ref 0.61–1.24)
GFR calc Af Amer: >60 mL/min (ref >60)
GFR calc non Af Amer: >60 mL/min (ref >60)
Glucose, Bld: 206 mg/dL — ABNORMAL HIGH (ref 70–99)
Potassium: 3.1 mmol/L — ABNORMAL LOW (ref 3.5–5.1)
Sodium: 140 mmol/L (ref 135–145)

## 2019-09-01 LAB — POCT I-STAT 7, (LYTES, BLD GAS, ICA,H+H)
Acid-base deficit: 1 mmol/L (ref 0.0–2.0)
Bicarbonate: 23.4 mmol/L (ref 20.0–28.0)
Calcium, Ion: 1.18 mmol/L (ref 1.15–1.40)
HCT: 45 % (ref 39.0–52.0)
Hemoglobin: 15.3 g/dL (ref 13.0–17.0)
O2 Saturation: 99 %
Patient temperature: 98.6
Potassium: 3 mmol/L — ABNORMAL LOW (ref 3.5–5.1)
Sodium: 140 mmol/L (ref 135–145)
TCO2: 25 mmol/L (ref 22–32)
pCO2 arterial: 38.1 mmHg (ref 32.0–48.0)
pH, Arterial: 7.395 (ref 7.350–7.450)
pO2, Arterial: 139 mmHg — ABNORMAL HIGH (ref 83.0–108.0)

## 2019-09-01 LAB — RESPIRATORY PANEL BY RT PCR (FLU A&B, COVID)
Influenza A by PCR: NEGATIVE
Influenza B by PCR: NEGATIVE
SARS Coronavirus 2 by RT PCR: POSITIVE — AB

## 2019-09-01 LAB — D-DIMER, QUANTITATIVE: D-Dimer, Quant: 2.46 ug/mL-FEU — ABNORMAL HIGH (ref 0.00–0.50)

## 2019-09-01 LAB — CREATININE, SERUM
Creatinine, Ser: 0.87 mg/dL (ref 0.61–1.24)
GFR calc Af Amer: >60 mL/min (ref >60)
GFR calc non Af Amer: >60 mL/min (ref >60)

## 2019-09-01 LAB — HEMOGLOBIN A1C
Hgb A1c MFr Bld: 5.7 % — ABNORMAL HIGH (ref 4.8–5.6)
Mean Plasma Glucose: 116.89 mg/dL

## 2019-09-01 LAB — C-REACTIVE PROTEIN: CRP: 2.5 mg/dL — ABNORMAL HIGH (ref <1.0)

## 2019-09-01 LAB — MAGNESIUM
Magnesium: 2 mg/dL (ref 1.7–2.4)
Magnesium: 2.2 mg/dL (ref 1.7–2.4)
Magnesium: 2.1 mg/dL (ref 1.7–2.4)

## 2019-09-01 LAB — LACTIC ACID, PLASMA: Lactic Acid, Venous: 2.3 mmol/L (ref 0.5–1.9)

## 2019-09-01 LAB — LACTATE DEHYDROGENASE: LDH: 151 U/L (ref 98–192)

## 2019-09-01 LAB — FIBRINOGEN: Fibrinogen: 521 mg/dL — ABNORMAL HIGH (ref 210–475)

## 2019-09-01 LAB — FERRITIN: Ferritin: 246 ng/mL (ref 24–336)

## 2019-09-01 LAB — MRSA PCR SCREENING: MRSA by PCR: NEGATIVE

## 2019-09-01 LAB — APTT: aPTT: 33 seconds (ref 24–36)

## 2019-09-01 IMAGING — CT CT ANGIO CHEST
2 of 6 series · 16 of 46 positions shown · IV contrast (omnipaque)
Comparison: CT angiography [DATE], chest radiograph [DATE]

CLINICAL DATA: Hypoxemia

EXAM:
CT ANGIOGRAPHY CHEST WITH CONTRAST
TECHNIQUE: Multidetector CT imaging of the chest was performed using the
standard protocol during bolus administration of intravenous
contrast. Multiplanar CT image reconstructions and MIPs were
obtained to evaluate the vascular anatomy.
CONTRAST:  100mL OMNIPAQUE IOHEXOL 350 MG/ML SOLN

[Series 6: thins · axial · 0.77mm/px · z∈[-336,-45]mm · 13 of 319 slices shown]
[im 14/319  lung]
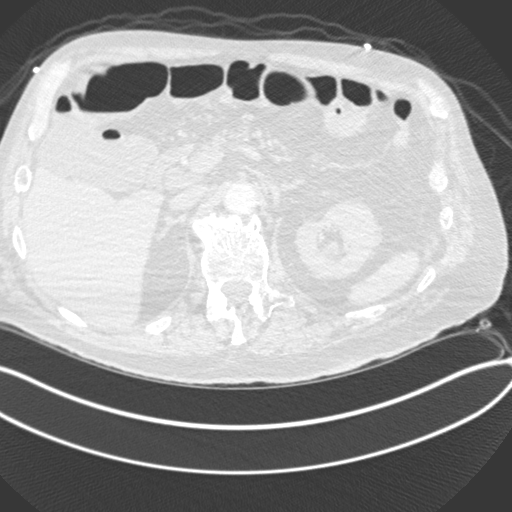
[im 42/319  soft-tissue]
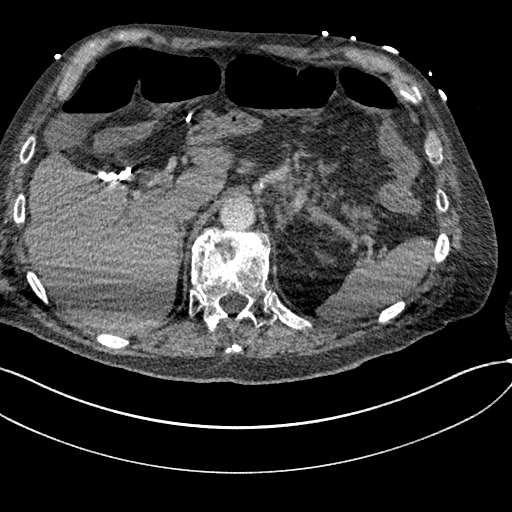
[im 70/319  lung]
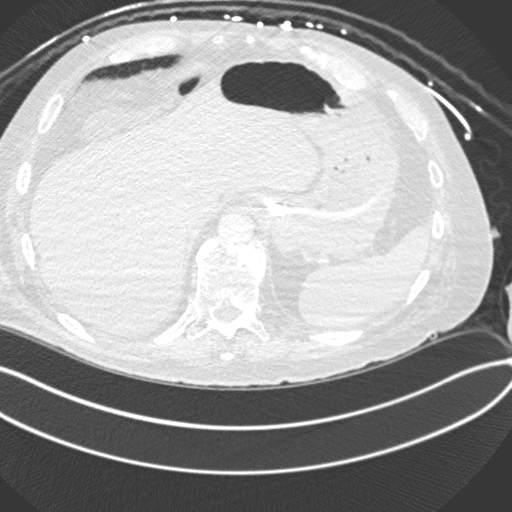
[im 83/319  soft-tissue]
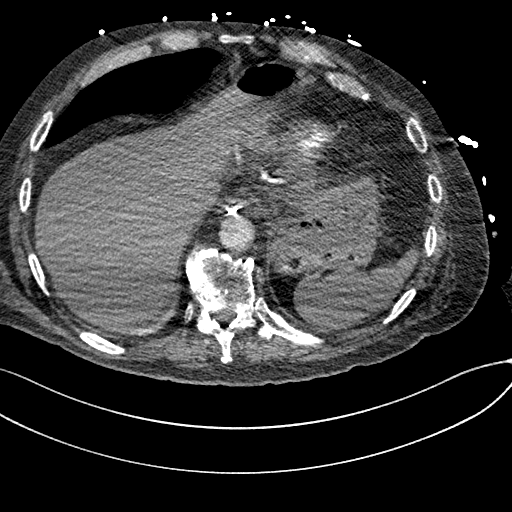
[im 111/319  lung]
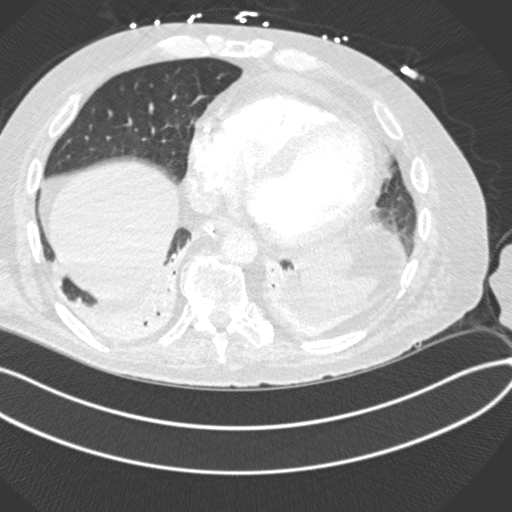
[im 139/319  soft-tissue]
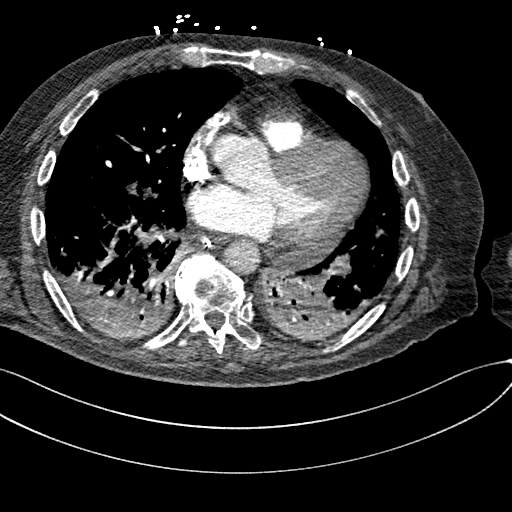
[im 166/319  lung]
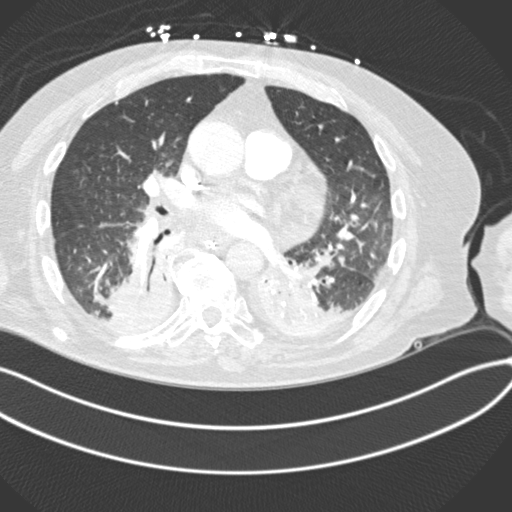
[im 180/319  soft-tissue]
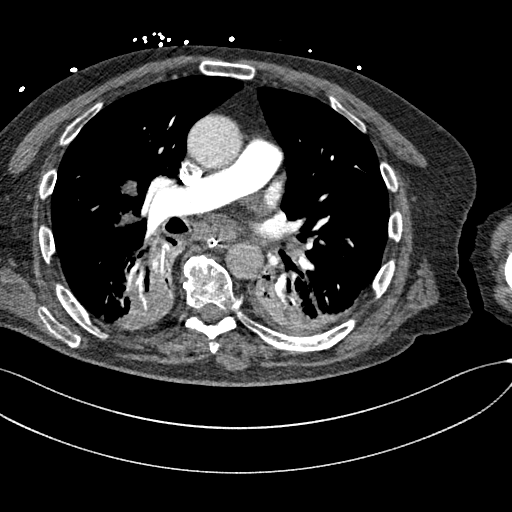
[im 208/319  lung]
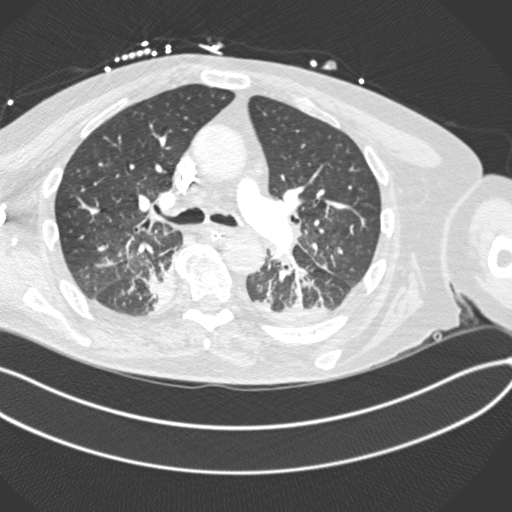
[im 236/319  soft-tissue]
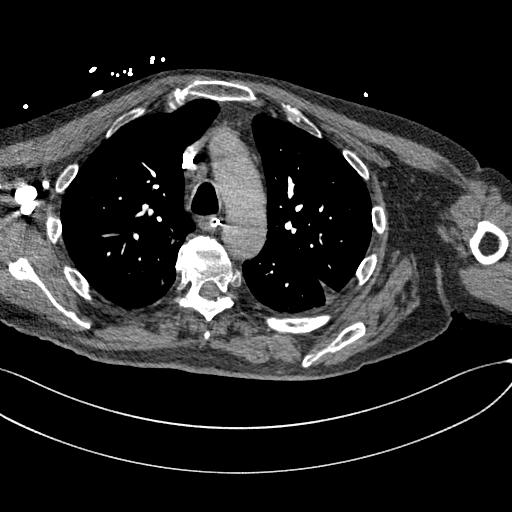
[im 249/319  lung]
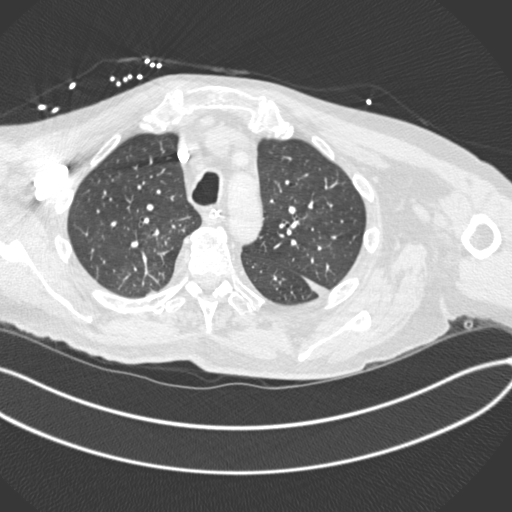
[im 277/319  soft-tissue]
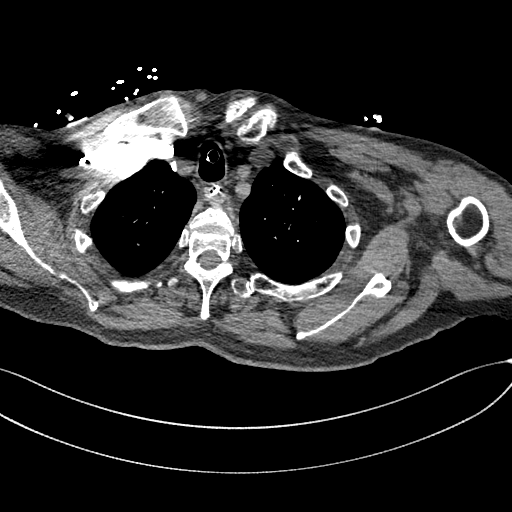
[im 305/319  lung]
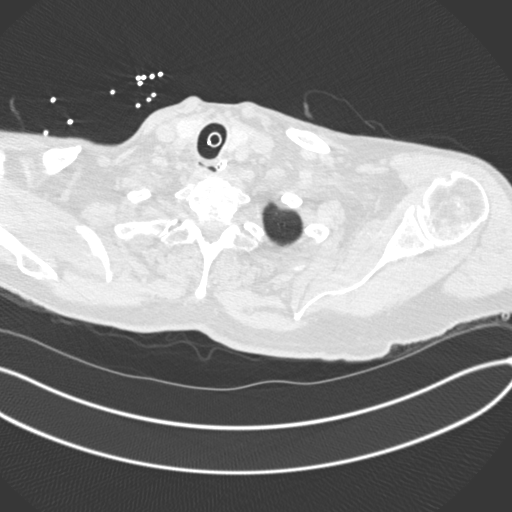

[Series 8: coronal mpr · coronal · 0.71mm/px · 3 of 151 slices shown]
[im 38/151  soft-tissue]
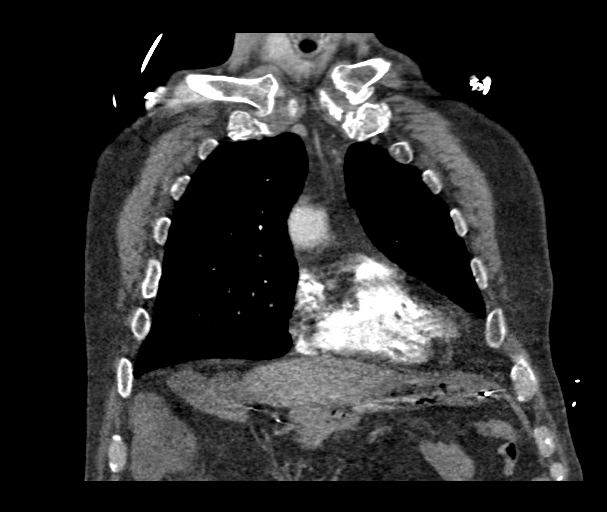
[im 76/151  soft-tissue]
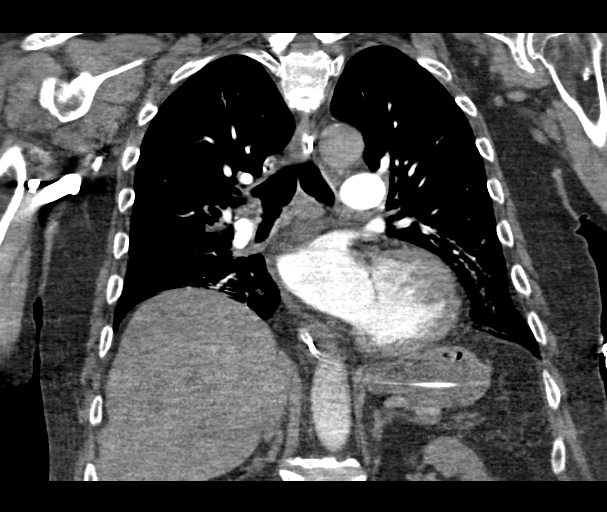
[im 113/151  soft-tissue]
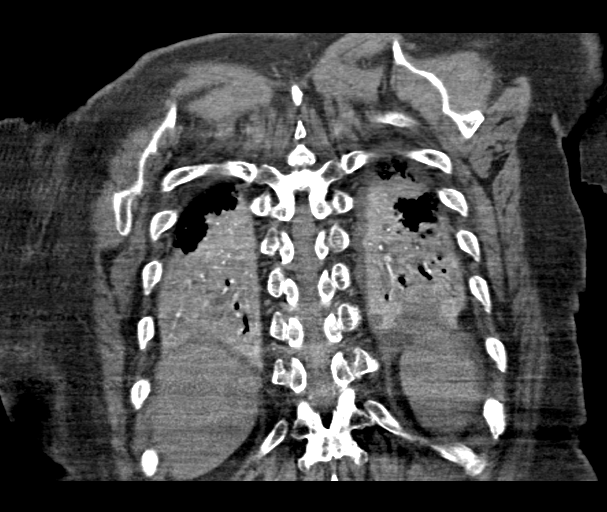

[16 of 46 positions shown; findings below may reference images not displayed]

FINDINGS: Cardiovascular: Satisfactory opacification of the pulmonary arteries
to the segmental level. No acute or chronic pulmonary arterial
filling defects are identified with resolution of the extensive
bilateral emboli seen on comparison from [K3]. Central pulmonary
arteries are normal caliber. There is a trace pericardial effusion.
Cardiac size is within normal limits. Coronary artery calcifications
are noted. Ascending thoracic aorta is mildly dilated and measures
up to 4.2 cm in diameter. Atherosclerotic plaque seen through the
thoracic aorta and proximal great vessels with normal 3 vessel
branching of the aortic arch.

Mediastinum/Nodes: Few scattered low-attenuation lymph nodes are
present in the mediastinum and hila. Index lymph node in the
subcarinal region measures to 12 mm not significantly changed from
comparison study. An additional enlarged 10 mm right precarinal
lymph node is unchanged from prior as well. No axillary adenopathy.
No acute abnormality of the thyroid gland and thoracic inlet.
Patient is intubated at the time of exam with the endotracheal tube
positioned 3.8 cm from the carina. A transesophageal tube tip is in
place with the tip terminating in the gastric body and the side port
positioned near the GE junction.

Lungs/Pleura: Dense consolidative opacities present in the lung
bases with associated volume loss. There are some airways thickening
and scattered secretions with several fluid-filled airways in the
regions of consolidation as well as some patchy parenchymal
hypoattenuation suggesting underlying pneumonia. Additional patchy
areas of airspace disease are seen in the superior segments of both
lower lobes in the posterior segment of the right upper lobe and
within the lingula. Trace left effusion is noted. Scattered
calcified granulomata noted within the lung parenchyma.

Upper Abdomen: Patient is post cholecystectomy with surgical clips
in the gallbladder fossa. Mild bilateral symmetric perinephric
stranding, a nonspecific finding though may correlate with either
age or decreased renal function. Partial fatty replacement of the
pancreas.

Musculoskeletal: Multilevel degenerative changes are present in the
imaged portions of the spine. Multilevel flowing anterior
osteophytosis, compatible with features of diffuse idiopathic
skeletal hyperostosis (DISH). Indeterminate sclerotic lesion seen in
the T12 level, new from comparison study.

Review of the MIP images confirms the above findings.
IMPRESSION: 1. No evidence of acute or chronic pulmonary embolus.
2. Dense consolidative opacities in the lung bases with fluid-filled
airways and some associated volume loss. Findings are compatible
with pneumonia and/or aspiration. Additional areas of patchy nodular
airspace disease are seen in the upper lobes and lingula as well.
Recommend 6-8 week follow-up CT to ensure resolution.
3. Trace left pleural effusion and pericardial effusion.
4. Coronary artery disease.  Aortic Atherosclerosis ([K3]-[K3]).
5. Indeterminate sclerotic lesion in the T12 level, new from
comparison study. If there is concern for osseous metastatic
disease, further evaluation with nonemergent outpatient bone scan
may be useful.
6. Additional chronic and incidental findings as detailed above.
7. Mild aneurysmal dilatation of the ascending thoracic aorta,
measuring up to 4.2 cm in diameter. Recommend annual imaging
followup by CTA or MRA. This recommendation follows [K3]
ACCF/AHA/AATS/ACR/ASA/SCA/AUJLA/AUJLA/AUJLA/AUJLA Guidelines for the
Diagnosis and Management of Patients with Thoracic Aortic Disease.
Circulation. [K3]; 121: [id number]. Aortic aneurysm NOS ([K3]-[K3])
8. Mild bilateral symmetric perinephric stranding, a nonspecific
finding though may correlate with either age or decreased renal
function. If there is concern for infection, correlation with
urinalysis may be useful.

## 2019-09-01 MED ORDER — SODIUM CHLORIDE 0.9 % IV SOLN
200.0000 mg | Freq: Once | INTRAVENOUS | Status: AC
  Administered 2019-09-01: 200 mg via INTRAVENOUS
  Filled 2019-09-01: qty 40

## 2019-09-01 MED ORDER — ASPIRIN 81 MG PO CHEW: 81.0000 mg | CHEWABLE_TABLET | Freq: Every day | ORAL | Status: DC

## 2019-09-01 MED ORDER — PRO-STAT SUGAR FREE PO LIQD
30.0000 mL | Freq: Two times a day (BID) | ORAL | Status: DC
  Administered 2019-09-01 – 2019-09-02 (×3): 30 mL
  Filled 2019-09-01 (×3): qty 30

## 2019-09-01 MED ORDER — MIDAZOLAM HCL 2 MG/2ML IJ SOLN: 2.0000 mg | INTRAMUSCULAR | Status: DC | PRN

## 2019-09-01 MED ORDER — HEPARIN SODIUM (PORCINE) 5000 UNIT/ML IJ SOLN: 5000.0000 [IU] | Freq: Three times a day (TID) | INTRAMUSCULAR | Status: DC

## 2019-09-01 MED ORDER — DEXAMETHASONE 4 MG PO TABS
6.0000 mg | ORAL_TABLET | ORAL | Status: DC
  Administered 2019-09-02: 6 mg
  Filled 2019-09-01: qty 2

## 2019-09-01 MED ORDER — ESCITALOPRAM OXALATE 10 MG PO TABS
10.0000 mg | ORAL_TABLET | Freq: Every day | ORAL | Status: DC
  Administered 2019-09-01 – 2019-09-09 (×8): 10 mg
  Filled 2019-09-01 (×8): qty 1

## 2019-09-01 MED ORDER — IPRATROPIUM BROMIDE HFA 17 MCG/ACT IN AERS
2.0000 | INHALATION_SPRAY | Freq: Four times a day (QID) | RESPIRATORY_TRACT | Status: DC
  Filled 2019-09-01: qty 12.9

## 2019-09-01 MED ORDER — MEMANTINE HCL ER 7 MG PO CP24
7.0000 mg | ORAL_CAPSULE | Freq: Every day | ORAL | Status: DC
  Administered 2019-09-01: 09:00:00 7 mg via ORAL
  Filled 2019-09-01: qty 1

## 2019-09-01 MED ORDER — PANTOPRAZOLE SODIUM 40 MG PO TBEC: 40.0000 mg | DELAYED_RELEASE_TABLET | Freq: Every day | ORAL | Status: DC

## 2019-09-01 MED ORDER — ORAL CARE MOUTH RINSE
15.0000 mL | OROMUCOSAL | Status: DC
  Administered 2019-09-01 – 2019-09-02 (×12): 15 mL via OROMUCOSAL

## 2019-09-01 MED ORDER — DEXAMETHASONE 4 MG PO TABS
6.0000 mg | ORAL_TABLET | ORAL | Status: DC
  Administered 2019-09-01: 6 mg via ORAL
  Filled 2019-09-01: qty 2

## 2019-09-01 MED ORDER — CHLORHEXIDINE GLUCONATE 0.12% ORAL RINSE (MEDLINE KIT)
15.0000 mL | Freq: Two times a day (BID) | OROMUCOSAL | Status: DC
  Administered 2019-09-01 – 2019-09-09 (×16): 15 mL via OROMUCOSAL

## 2019-09-01 MED ORDER — POTASSIUM CHLORIDE 20 MEQ/15ML (10%) PO SOLN
40.0000 meq | Freq: Once | ORAL | Status: AC
  Administered 2019-09-01: 12:00:00 40 meq
  Filled 2019-09-01: qty 30

## 2019-09-01 MED ORDER — BISACODYL 10 MG RE SUPP: 10.0000 mg | Freq: Every day | RECTAL | Status: DC | PRN

## 2019-09-01 MED ORDER — FENTANYL CITRATE (PF) 100 MCG/2ML IJ SOLN: 25.0000 ug | INTRAMUSCULAR | Status: DC | PRN

## 2019-09-01 MED ORDER — CLOPIDOGREL BISULFATE 75 MG PO TABS
75.0000 mg | ORAL_TABLET | Freq: Every day | ORAL | Status: DC
  Administered 2019-09-01 – 2019-09-09 (×8): 75 mg
  Filled 2019-09-01 (×8): qty 1

## 2019-09-01 MED ORDER — PROPOFOL 1000 MG/100ML IV EMUL
0.0000 ug/kg/min | INTRAVENOUS | Status: DC
  Administered 2019-09-01: 20 ug/kg/min via INTRAVENOUS
  Administered 2019-09-01: 19:00:00 15 ug/kg/min via INTRAVENOUS
  Administered 2019-09-02: 05:00:00 20 ug/kg/min via INTRAVENOUS
  Filled 2019-09-01 (×2): qty 100

## 2019-09-01 MED ORDER — PANTOPRAZOLE SODIUM 40 MG PO PACK
40.0000 mg | PACK | Freq: Every day | ORAL | Status: DC
  Administered 2019-09-01 – 2019-09-09 (×8): 40 mg
  Filled 2019-09-01 (×8): qty 20

## 2019-09-01 MED ORDER — VITAMIN D 25 MCG (1000 UNIT) PO TABS
1000.0000 [IU] | ORAL_TABLET | Freq: Every day | ORAL | Status: DC
  Administered 2019-09-01 – 2019-09-09 (×8): 1000 [IU]
  Filled 2019-09-01 (×11): qty 1

## 2019-09-01 MED ORDER — DOCUSATE SODIUM 50 MG/5ML PO LIQD: 100.0000 mg | Freq: Two times a day (BID) | ORAL | Status: DC | PRN

## 2019-09-01 MED ORDER — ESCITALOPRAM OXALATE 10 MG PO TABS: 10.0000 mg | ORAL_TABLET | Freq: Every day | ORAL | Status: DC

## 2019-09-01 MED ORDER — PIPERACILLIN-TAZOBACTAM 3.375 G IVPB
3.3750 g | Freq: Three times a day (TID) | INTRAVENOUS | Status: DC
  Administered 2019-09-01 – 2019-09-03 (×7): 3.375 g via INTRAVENOUS
  Filled 2019-09-01 (×9): qty 50

## 2019-09-01 MED ORDER — VITAL HIGH PROTEIN PO LIQD
1000.0000 mL | ORAL | Status: DC
  Administered 2019-09-01: 1000 mL

## 2019-09-01 MED ORDER — ASPIRIN 81 MG PO CHEW
81.0000 mg | CHEWABLE_TABLET | Freq: Every day | ORAL | Status: DC
  Administered 2019-09-01 – 2019-09-09 (×8): 81 mg
  Filled 2019-09-01 (×8): qty 1

## 2019-09-01 MED ORDER — ZINC SULFATE 220 (50 ZN) MG PO CAPS
220.0000 mg | ORAL_CAPSULE | Freq: Every day | ORAL | Status: DC
  Administered 2019-09-01 – 2019-09-09 (×8): 220 mg
  Filled 2019-09-01 (×8): qty 1

## 2019-09-01 MED ORDER — SODIUM CHLORIDE 0.9 % IV SOLN
100.0000 mg | Freq: Every day | INTRAVENOUS | Status: AC
  Administered 2019-09-02 – 2019-09-05 (×4): 100 mg via INTRAVENOUS
  Filled 2019-09-01 (×4): qty 20

## 2019-09-01 MED ORDER — VITAMIN C 500 MG/5ML PO SYRP
250.0000 mg | ORAL_SOLUTION | Freq: Every day | ORAL | Status: DC
  Administered 2019-09-01 – 2019-09-09 (×8): 250 mg
  Filled 2019-09-01 (×9): qty 2.5

## 2019-09-01 MED ORDER — ATORVASTATIN CALCIUM 40 MG PO TABS
40.0000 mg | ORAL_TABLET | Freq: Every day | ORAL | Status: DC
  Administered 2019-09-01 – 2019-09-09 (×8): 40 mg
  Filled 2019-09-01 (×8): qty 1

## 2019-09-01 MED ORDER — INSULIN ASPART 100 UNIT/ML ~~LOC~~ SOLN
0.0000 [IU] | SUBCUTANEOUS | Status: DC
  Administered 2019-09-01: 3 [IU] via SUBCUTANEOUS
  Administered 2019-09-01: 20:00:00 2 [IU] via SUBCUTANEOUS
  Administered 2019-09-01: 1 [IU] via SUBCUTANEOUS
  Administered 2019-09-01 (×2): 2 [IU] via SUBCUTANEOUS
  Administered 2019-09-01 – 2019-09-02 (×2): 1 [IU] via SUBCUTANEOUS
  Administered 2019-09-02: 12:00:00 5 [IU] via SUBCUTANEOUS
  Administered 2019-09-02 – 2019-09-03 (×2): 2 [IU] via SUBCUTANEOUS
  Administered 2019-09-04 (×3): 3 [IU] via SUBCUTANEOUS
  Administered 2019-09-05: 18:00:00 2 [IU] via SUBCUTANEOUS
  Administered 2019-09-05: 5 [IU] via SUBCUTANEOUS
  Administered 2019-09-05 (×2): 2 [IU] via SUBCUTANEOUS
  Administered 2019-09-05: 1 [IU] via SUBCUTANEOUS
  Administered 2019-09-06: 13:00:00 3 [IU] via SUBCUTANEOUS
  Administered 2019-09-06 (×3): 2 [IU] via SUBCUTANEOUS
  Administered 2019-09-06: 3 [IU] via SUBCUTANEOUS
  Administered 2019-09-06 – 2019-09-07 (×2): 2 [IU] via SUBCUTANEOUS
  Administered 2019-09-07: 1 [IU] via SUBCUTANEOUS
  Administered 2019-09-07: 3 [IU] via SUBCUTANEOUS
  Administered 2019-09-07: 2 [IU] via SUBCUTANEOUS
  Administered 2019-09-07 – 2019-09-08 (×3): 1 [IU] via SUBCUTANEOUS
  Administered 2019-09-08: 3 [IU] via SUBCUTANEOUS
  Administered 2019-09-08 (×2): 2 [IU] via SUBCUTANEOUS
  Administered 2019-09-08 – 2019-09-09 (×2): 1 [IU] via SUBCUTANEOUS
  Administered 2019-09-09 (×2): 2 [IU] via SUBCUTANEOUS
  Administered 2019-09-09 (×2): 1 [IU] via SUBCUTANEOUS

## 2019-09-01 MED ORDER — ATORVASTATIN CALCIUM 40 MG PO TABS: 40.0000 mg | ORAL_TABLET | Freq: Every day | ORAL | Status: DC

## 2019-09-01 MED ORDER — CHLORHEXIDINE GLUCONATE CLOTH 2 % EX PADS
6.0000 | MEDICATED_PAD | Freq: Every day | CUTANEOUS | Status: DC
  Administered 2019-09-02 – 2019-09-09 (×5): 6 via TOPICAL

## 2019-09-01 MED ORDER — HEPARIN SODIUM (PORCINE) 5000 UNIT/ML IJ SOLN
5000.0000 [IU] | Freq: Three times a day (TID) | INTRAMUSCULAR | Status: DC
  Administered 2019-09-01 – 2019-09-09 (×25): 5000 [IU] via SUBCUTANEOUS
  Filled 2019-09-01 (×26): qty 1

## 2019-09-01 MED ORDER — IOHEXOL 350 MG/ML SOLN
100.0000 mL | Freq: Once | INTRAVENOUS | Status: AC | PRN
  Administered 2019-09-01: 100 mL via INTRAVENOUS

## 2019-09-01 MED ORDER — CLOPIDOGREL BISULFATE 75 MG PO TABS: 75.0000 mg | ORAL_TABLET | Freq: Every day | ORAL | Status: DC

## 2019-09-01 NOTE — Progress Notes (Signed)
[facility] Progress Note Patient Name: JAQUAIL KLUESNER DOB: Sep 13, 1945 MRN: [id number]   Date of Service  09/01/2019  HPI/Events of Note  PT is SNF resident seen in ED at [facility] earlier today with acute hypoxemic respiratory failure secondary to Covid-19 pneumonia.  eICU Interventions  New patient evaluation completed.        Kerry Kass Searra Carnathan 09/01/2019, 5:18 AM

## 2019-09-01 NOTE — ED Notes (Addendum)
Soft wrist restraints applied per Orpah Melter, MD. Pt moving hands towards ET tube.

## 2019-09-01 NOTE — Progress Notes (Signed)
-  NAME:  John Blackburn, MRN:  [id number], DOB:  01/07/1946, LOS: 0 ADMISSION DATE:  08/31/2019, CONSULTATION DATE:  09/01/19 CHIEF COMPLAINT:  Hypoxic respiratory failure   Brief History   73 yo male brought from SNF to ER after episode of nausea/vomiting and dyspnea.  Had altered mental status and hypoxia in ER and required intubation.  Concern for aspiration pneumonia.  COVID 19 positive.  Had out of hospital DNR form (wasn't available in ER prior to his intubation).   Past Medical History  HTN, HLD, GERD, Depression, CAD s/p stent, PE not on anticoagulation, OSA, Diastolic CHF, Memory loss  Significant Hospital Events   12/26 Admit  Consults:    Procedures:  ETT 12/26 >>   Significant Diagnostic Tests:  CT angio chest 12/27 >> atherosclerosis, no PE, 4.2 cm ascending aorta, dense consolidation in lung bases b/l, patchy ASD in other areas  Micro Data:  SARS CoV2 PCR 12/26 >> positive Influenza PCR 12/26 >> negative A and B Sputum 12/27 >>  Blood 12/27 >>   Antimicrobials:  Zosyn 12/26 >>  COVID treatment:  Remdesivir 12/26 >> Decadron 12/26 >>   Interim history/subjective:  Remains on full vent support.  Objective   Blood pressure 114/79, pulse 79, temperature (!) 97.5 F (36.4 C), temperature source Axillary, resp. rate 18, height 5\' 11"  (1.803 m), weight 96.3 kg, SpO2 98 %.    Vent Mode: PRVC FiO2 (%):  [50 %-100 %] 50 % Set Rate:  [12 bmp-22 bmp] 12 bmp Vt Set:  [600 mL] 600 mL PEEP:  [8 cmH20] 8 cmH20 Plateau Pressure:  [22 cmH20] 22 cmH20   Intake/Output Summary (Last 24 hours) at 09/01/2019 1125 Last data filed at 09/01/2019 1000 Gross per 24 hour  Intake 967.51 ml  Output 400 ml  Net 567.51 ml   Filed Weights   08/31/19 2245 09/01/19 0400  Weight: 100.7 kg 96.3 kg    Examination:  General - sedated Eyes - pupils reactive ENT - ETT in place Cardiac - regular rate/rhythm, no murmur Chest - basilar crackles Abdomen - soft, non tender, +  bowel sounds Extremities - no cyanosis, clubbing, or edema; decreased muscle bulk Skin - no rashes Neuro - RASS -3  Resolved Hospital Problem list   Lactic acidosis  Assessment & Plan:   Acute hypoxic respiratory failure from aspiration pneumonia and COVID 19 pneumonia. - goal SpO2 > 90% - f/u CXR - day 2 of zosyn - day 2 of decadron, remdesivir - zinc, vit C, Vit D  Acute metabolic encephalopathy from hypoxic respiratory failure. Memory loss. - RASS goal 0 to -1  Hx of CAD s/p stent, HLD, chronic diastolic CHF. - continue ASA, plavix, lipitor  Hypokalemia. - replace  Steroid induced hyperglycemia. - SSI   Best practice:  Diet: tube feeds DVT prophylaxis: SQ heparin GI prophylaxis: protonix Mobility: bed rest Code Status: DNR Disposition: ICU  Labs    CMP Latest Ref Rng & Units 09/01/2019 09/01/2019 09/01/2019  Glucose 70 - 99 mg/dL 206(H) - -  BUN 8 - 23 mg/dL 19 - -  Creatinine 0.61 - 1.24 mg/dL 0.95 0.87 -  Sodium 135 - 145 mmol/L 140 - 140  Potassium 3.5 - 5.1 mmol/L 3.1(L) - 3.0(L)  Chloride 98 - 111 mmol/L 108 - -  CO2 22 - 32 mmol/L 20(L) - -  Calcium 8.9 - 10.3 mg/dL 8.4(L) - -  Total Protein 6.5 - 8.1 g/dL - - -  Total Bilirubin 0.3 - 1.2 mg/dL - - -  Alkaline Phos 38 - 126 U/L - - -  AST 15 - 41 U/L - - -  ALT 0 - 44 U/L - - -    CBC Latest Ref Rng & Units 09/01/2019 09/01/2019 08/31/2019  WBC 4.0 - 10.5 K/uL 11.3(H) - 3.9(L)  Hemoglobin 13.0 - 17.0 g/dL 16.1 15.3 16.5  Hematocrit 39.0 - 52.0 % 46.9 45.0 50.2  Platelets 150 - 400 K/uL 189 - 197    ABG    Component Value Date/Time   PHART 7.395 09/01/2019 0008   PCO2ART 38.1 09/01/2019 0008   PO2ART 139.0 (H) 09/01/2019 0008   HCO3 23.4 09/01/2019 0008   TCO2 25 09/01/2019 0008   ACIDBASEDEF 1.0 09/01/2019 0008   O2SAT 99.0 09/01/2019 0008    CBG (last 3)  Recent Labs    09/01/19 0412 09/01/19 0818  GLUCAP 192* 204*    CC time 42 minutes  Chesley Mires, MD [facility]  Pulmonary/Critical Care 09/01/2019, 11:41 AM

## 2019-09-01 NOTE — Progress Notes (Signed)
Attempted to contact pt's son at listed number for update.  No answer.  Chesley Mires, MD [facility] Pulmonary/Critical Care 09/01/2019, 4:19 PM

## 2019-09-01 NOTE — H&P (Signed)
NAME:  John Blackburn, MRN:  [id number], DOB:  05/23/1946, LOS: 0 ADMISSION DATE:  08/31/2019, CONSULTATION DATE:  09/01/19 CHIEF COMPLAINT:  Hypoxic respiratory failure   Brief History   73 yo patient with history of Depression, CAD, PE not on AC, and diastolic HF who presented for hypoxic respiratory failure and nausea/vomiting  History of present illness   This is a 74 yo patient with history of HTN, HLD, GERD, Depression, CAD sp stent placement, PE not on AC, OSA on CPAP diastolic HF and memorey loss.  Presents from [facility] SNF for concern for aspiration. Patient had been nauseas for the past few days and had progressive worsening breathing. Nursing home noted patient vomiting and worsening breathing and sent him to ED for further evaluation. Patient arrived being bagged by EMS to the ED. Given this the patient was intubated. He had no paper work with him at the time. After speaking to the son it was discovered that patient was DNR. Son okay with patient remaining inutabted for now.  In Ed workup remarkable for Lactic acid of 3.4, positive COVID PCR and CXR that shows diffuse bialteral intestitial opacities.   Past Medical History  HTN,  HLD. GERD,  Depression,  CAD  sp stent placement  Significant Hospital Events   Intubated on 12/26  Consults:  NA  Procedures:  Inutbated  Significant Diagnostic Tests:  Covid positive on 12/26  Micro Data:  UCX and BCX pending  Antimicrobials:  Zosyn and azithromycin  Interim history/subjective:  NA  Objective   Blood pressure 122/75, pulse 95, temperature (!) 103.7 F (39.8 C), temperature source Rectal, resp. rate 18, height 5\' 11"  (1.803 m), weight 100.7 kg, SpO2 100 %.    Vent Mode: PRVC FiO2 (%):  [100 %] 100 % Set Rate:  [22 bmp] 22 bmp Vt Set:  [600 mL] 600 mL PEEP:  [8 cmH20] 8 cmH20 Plateau Pressure:  [22 cmH20] 22 cmH20  No intake or output data in the 24 hours ending 09/01/19 0035 Filed Weights   08/31/19 2245    Weight: 100.7 kg    Examination: General: Patient following commands HENT: Dried brown emsis around oral mucosa Lungs: Coarse with crackles and wheezing Cardiovascular: RRR Abdomen: Soft no guarding or rebound noted Extremities: With no gross abnomrlaities Neuro: follows commands. Can open eyes, squeeze hands and wiggle toes bilaterally GU: Foley in place  Resolved Hospital Problem list   NA  Assessment & Plan:  This is a 73 yo male with history as noted above who presents for chief complaint of respiratory distress and vomiting noted to be COVID positive   Acute hypoxic respiratory failure-Likely 2/2 COVID -Remdesivir -Decadron -Zosyn and azithromycin -Lung protective ventilation -Diurese when pressures more stable  HTN-Hold home meds for now -  Memory loss- cotninue Namenda  History of PE in 2014 -Patient not currenly on blood thinners -Will reassess with CTA now         Best practice:  Diet: NPO for now Pain/Anxiety/Delirium protocol (if indicated): Propfol VAP protocol (if indicated): HOB at 30 degrees DVT prophylaxis: heparin subq GI prophylaxis: protonix Glucose control: PRN insulin Mobility: NA Code Status: DNR per son. Attempted to cal but unable to reach him this evening.  Family Communication: Attempted to call son but unable to reach Disposition: ICU  Labs   CBC: Recent Labs  Lab 08/31/19 2242 09/01/19 0008  WBC 3.9*  --   NEUTROABS 2.7  --   HGB 16.5 15.3  HCT 50.2 45.0  MCV 96.0  --   PLT 197  --     Basic Metabolic Panel: Recent Labs  Lab 08/31/19 2242 09/01/19 0008  NA 139 140  K 3.4* 3.0*  CL 103  --   CO2 25  --   GLUCOSE 245*  --   BUN 15  --   CREATININE 1.08  --   CALCIUM 8.5*  --    GFR: Estimated Creatinine Clearance: 73.7 mL/min (by C-G formula based on SCr of 1.08 mg/dL). Recent Labs  Lab 08/31/19 2242  WBC 3.9*  LATICACIDVEN 3.4*    Liver Function Tests: Recent Labs  Lab 08/31/19 2242  AST 45*   ALT 36  ALKPHOS 94  BILITOT 0.4  PROT 6.9  ALBUMIN 3.2*   No results for input(s): LIPASE, AMYLASE in the last 168 hours. No results for input(s): AMMONIA in the last 168 hours.  ABG    Component Value Date/Time   PHART 7.395 09/01/2019 0008   PCO2ART 38.1 09/01/2019 0008   PO2ART 139.0 (H) 09/01/2019 0008   HCO3 23.4 09/01/2019 0008   TCO2 25 09/01/2019 0008   ACIDBASEDEF 1.0 09/01/2019 0008   O2SAT 99.0 09/01/2019 0008     Coagulation Profile: Recent Labs  Lab 08/31/19 2242  INR 1.2    Cardiac Enzymes: No results for input(s): CKTOTAL, CKMB, CKMBINDEX, TROPONINI in the last 168 hours.  HbA1C: Hgb A1c MFr Bld  Date/Time Value Ref Range Status  03/05/2019 04:13 AM 5.4 4.8 - 5.6 % Final    Comment:    (NOTE) Pre diabetes:          5.7%-6.4% Diabetes:              >6.4% Glycemic control for   <7.0% adults with diabetes   11/27/2017 11:49 AM 6.3 4.6 - 6.5 % Final    Comment:    Glycemic Control Guidelines for People with Diabetes:Non Diabetic:  <6%Goal of Therapy: <7%Additional Action Suggested:  >8%     CBG: No results for input(s): GLUCAP in the last 168 hours.  Review of Systems:   Unable to attain as patient intubated  Past Medical History  He,  has a past medical history of Allergy, Arthritis, CAD (coronary artery disease), Depression, Diverticulitis of colon (2000 's), GERD (gastroesophageal reflux disease), adenomatous colonic polyps, Hyperlipidemia, Hypertension, Myocardial infarction ([place]), Peptic ulcer, Pulmonary emboli ([place]), skin cancer, and Sleep apnea, primary central.   Surgical History    Past Surgical History:  Procedure Laterality Date  . ANAL RECTAL MANOMETRY N/A 11/30/2016   Procedure: ANO RECTAL MANOMETRY;  Surgeon: Mauri Pole, MD;  Location: [facility];  Service: Endoscopy;  Laterality: N/A;  . CARDIAC CATHETERIZATION    . CHOLECYSTECTOMY  1990   laproscopic   . CORONARY STENT INTERVENTION N/A 09/01/2017   Procedure:  CORONARY STENT INTERVENTION;  Surgeon: Jettie Booze, MD;  Location: [facility];  Service: Cardiovascular;  Laterality: N/A;  . HERNIA REPAIR    . JOINT REPLACEMENT     knee  . PROSTATE SURGERY     needle biopsy's, TURP  . RIGHT/LEFT HEART CATH AND CORONARY ANGIOGRAPHY N/A 09/01/2017   Procedure: RIGHT/LEFT HEART CATH AND CORONARY ANGIOGRAPHY;  Surgeon: Jettie Booze, MD;  Location: [facility];  Service: Cardiovascular;  Laterality: N/A;  . TKR bilateral  2006   Dr. Violet Baldy  . UPPER GASTROINTESTINAL ENDOSCOPY    . VASECTOMY       Social History   reports that he has  never smoked. He has never used smokeless tobacco. He reports current alcohol use of about 1.0 standard drinks of alcohol per week. He reports that he does not use drugs.   Family History   His family history includes Colon cancer in his cousin, cousin, father, paternal aunt, paternal uncle, paternal uncle, and sister; Heart disease in his father, mother, paternal grandfather, and paternal uncle; Hypertension in his mother; Stroke in his mother. There is no history of Stomach cancer, Rectal cancer, or Esophageal cancer.   Allergies Allergies  Allergen Reactions  . Cephalexin Rash  . Levofloxacin Rash     Home Medications  Prior to Admission medications   Medication Sig Start Date End Date Taking? Authorizing Provider  amLODipine (NORVASC) 5 MG tablet Take 1 tablet (5 mg total) by mouth daily. 03/21/19 03/20/20 Yes Shelly Coss, MD  aspirin EC 81 MG EC tablet Take 1 tablet (81 mg total) by mouth daily. 09/05/17  Yes Reino Bellis B, NP  atorvastatin (LIPITOR) 40 MG tablet Take 40 mg by mouth daily at 6 PM.   Yes [provider]  clopidogrel (PLAVIX) 75 MG tablet Take 1 tablet (75 mg total) by mouth daily. Patient will be scheduled for AUGUST 2020. Patient taking differently: Take 75 mg by mouth daily.  12/07/18  Yes Lorretta Harp, MD  escitalopram (LEXAPRO) 10 MG tablet  Take 1 tablet by mouth once daily Patient taking differently: Take 10 mg by mouth daily.  11/01/18  Yes Hoyt Koch, MD  lactose free nutrition (BOOST) LIQD Take 237 mLs by mouth 2 (two) times daily between meals.   Yes [provider]  loratadine (CLARITIN) 10 MG tablet Take 1 tablet (10 mg total) by mouth daily as needed for allergies. 03/06/19  Yes Georgette Shell, MD  Melatonin 3 MG TABS Take 3 mg by mouth at bedtime.   Yes [provider]  memantine (NAMENDA XR) 7 MG CP24 24 hr capsule TAKE 1  BY MOUTH ONCE DAILY Patient taking differently: Take 7 mg by mouth daily.  02/11/19  Yes Hoyt Koch, MD  nitroGLYCERIN (NITROSTAT) 0.4 MG SL tablet Place 1 tablet (0.4 mg total) under the tongue every 5 (five) minutes as needed. Patient taking differently: Place 0.4 mg under the tongue every 5 (five) minutes x 2 doses as needed for chest pain.  09/04/17  Yes Reino Bellis B, NP  pantoprazole (PROTONIX) 40 MG tablet Take 1 tablet (40 mg total) by mouth daily. Patient will be scheduled for AUGUST 2020. Patient taking differently: Take 40 mg by mouth daily.  12/07/18  Yes Lorretta Harp, MD  polyethylene glycol powder (GLYCOLAX/MIRALAX) 17 GM/SCOOP powder Take 17 g by mouth daily. MIX INTO 4-8 OUNCES OF LIQUID   Yes [provider]  PRESCRIPTION MEDICATION See admin instructions. CPAP- At bedtime (remove each morning)   Yes [provider]  senna-docusate (SENOKOT-S) 8.6-50 MG tablet Take 1 tablet by mouth 2 (two) times daily as needed for mild constipation. Patient taking differently: Take 1 tablet by mouth at bedtime.  03/21/19  Yes Shelly Coss, MD  vitamin B-12 (CYANOCOBALAMIN) 1000 MCG tablet Take 1,000 mcg by mouth daily.   Yes [provider]  atorvastatin (LIPITOR) 80 MG tablet Take 1 tablet (80 mg total) by mouth daily at 6 PM. KEEP OV. Patient not taking: Reported on 08/31/2019 02/25/19   Lorretta Harp, MD  buPROPion  (WELLBUTRIN XL) 300 MG 24 hr tablet TAKE 1 TABLET BY MOUTH ONCE DAILY Patient  not taking: No sig reported 06/11/18   Hoyt Koch, MD     Critical care time: 60  minutes

## 2019-09-02 ENCOUNTER — Inpatient Hospital Stay (HOSPITAL_COMMUNITY): Payer: PPO

## 2019-09-02 DIAGNOSIS — U071 COVID-19: Principal | ICD-10-CM

## 2019-09-02 LAB — URINE CULTURE: Culture: NO GROWTH

## 2019-09-02 LAB — COMPREHENSIVE METABOLIC PANEL
ALT: 29 U/L (ref 0–44)
AST: 29 U/L (ref 15–41)
Albumin: 2.8 g/dL — ABNORMAL LOW (ref 3.5–5.0)
Alkaline Phosphatase: 60 U/L (ref 38–126)
Anion gap: 13 (ref 5–15)
BUN: 33 mg/dL — ABNORMAL HIGH (ref 8–23)
CO2: 24 mmol/L (ref 22–32)
Calcium: 8.9 mg/dL (ref 8.9–10.3)
Chloride: 102 mmol/L (ref 98–111)
Creatinine, Ser: 1.18 mg/dL (ref 0.61–1.24)
GFR calc Af Amer: >60 mL/min (ref >60)
GFR calc non Af Amer: >60 mL/min (ref >60)
Glucose, Bld: 170 mg/dL — ABNORMAL HIGH (ref 70–99)
Potassium: 4 mmol/L (ref 3.5–5.1)
Sodium: 139 mmol/L (ref 135–145)
Total Bilirubin: 0.6 mg/dL (ref 0.3–1.2)
Total Protein: 6.7 g/dL (ref 6.5–8.1)

## 2019-09-02 LAB — CBC
HCT: 44 % (ref 39.0–52.0)
Hemoglobin: 14.7 g/dL (ref 13.0–17.0)
MCH: 31.9 pg (ref 26.0–34.0)
MCHC: 33.4 g/dL (ref 30.0–36.0)
MCV: 95.4 fL (ref 80.0–100.0)
Platelets: 178 10*3/uL (ref 150–400)
RBC: 4.61 MIL/uL (ref 4.22–5.81)
RDW: 15.1 % (ref 11.5–15.5)
WBC: 20.4 10*3/uL — ABNORMAL HIGH (ref 4.0–10.5)
nRBC: 0 % (ref 0.0–0.2)

## 2019-09-02 LAB — GLUCOSE, CAPILLARY
Glucose-Capillary: 147 mg/dL — ABNORMAL HIGH (ref 70–99)
Glucose-Capillary: 143 mg/dL — ABNORMAL HIGH (ref 70–99)
Glucose-Capillary: 181 mg/dL — ABNORMAL HIGH (ref 70–99)
Glucose-Capillary: 77 mg/dL (ref 70–99)
Glucose-Capillary: 94 mg/dL (ref 70–99)
Glucose-Capillary: 93 mg/dL (ref 70–99)

## 2019-09-02 LAB — TRIGLYCERIDES: Triglycerides: 66 mg/dL (ref <150)

## 2019-09-02 LAB — MAGNESIUM
Magnesium: 2.2 mg/dL (ref 1.7–2.4)
Magnesium: 2.2 mg/dL (ref 1.7–2.4)

## 2019-09-02 LAB — PHOSPHORUS
Phosphorus: 3.6 mg/dL (ref 2.5–4.6)
Phosphorus: 2.1 mg/dL — ABNORMAL LOW (ref 2.5–4.6)

## 2019-09-02 IMAGING — DX DG CHEST 1V PORT
1 series · 2 of 2 positions shown · non-contrast
Comparison: Chest x-ray [DATE]

CLINICAL DATA: Respiratory failure.  COVID pneumonia.

EXAM:
PORTABLE CHEST 1 VIEW

[Series 1: chest · 0.14mm/px · 2 of 2 slices shown]
[im 1/2]
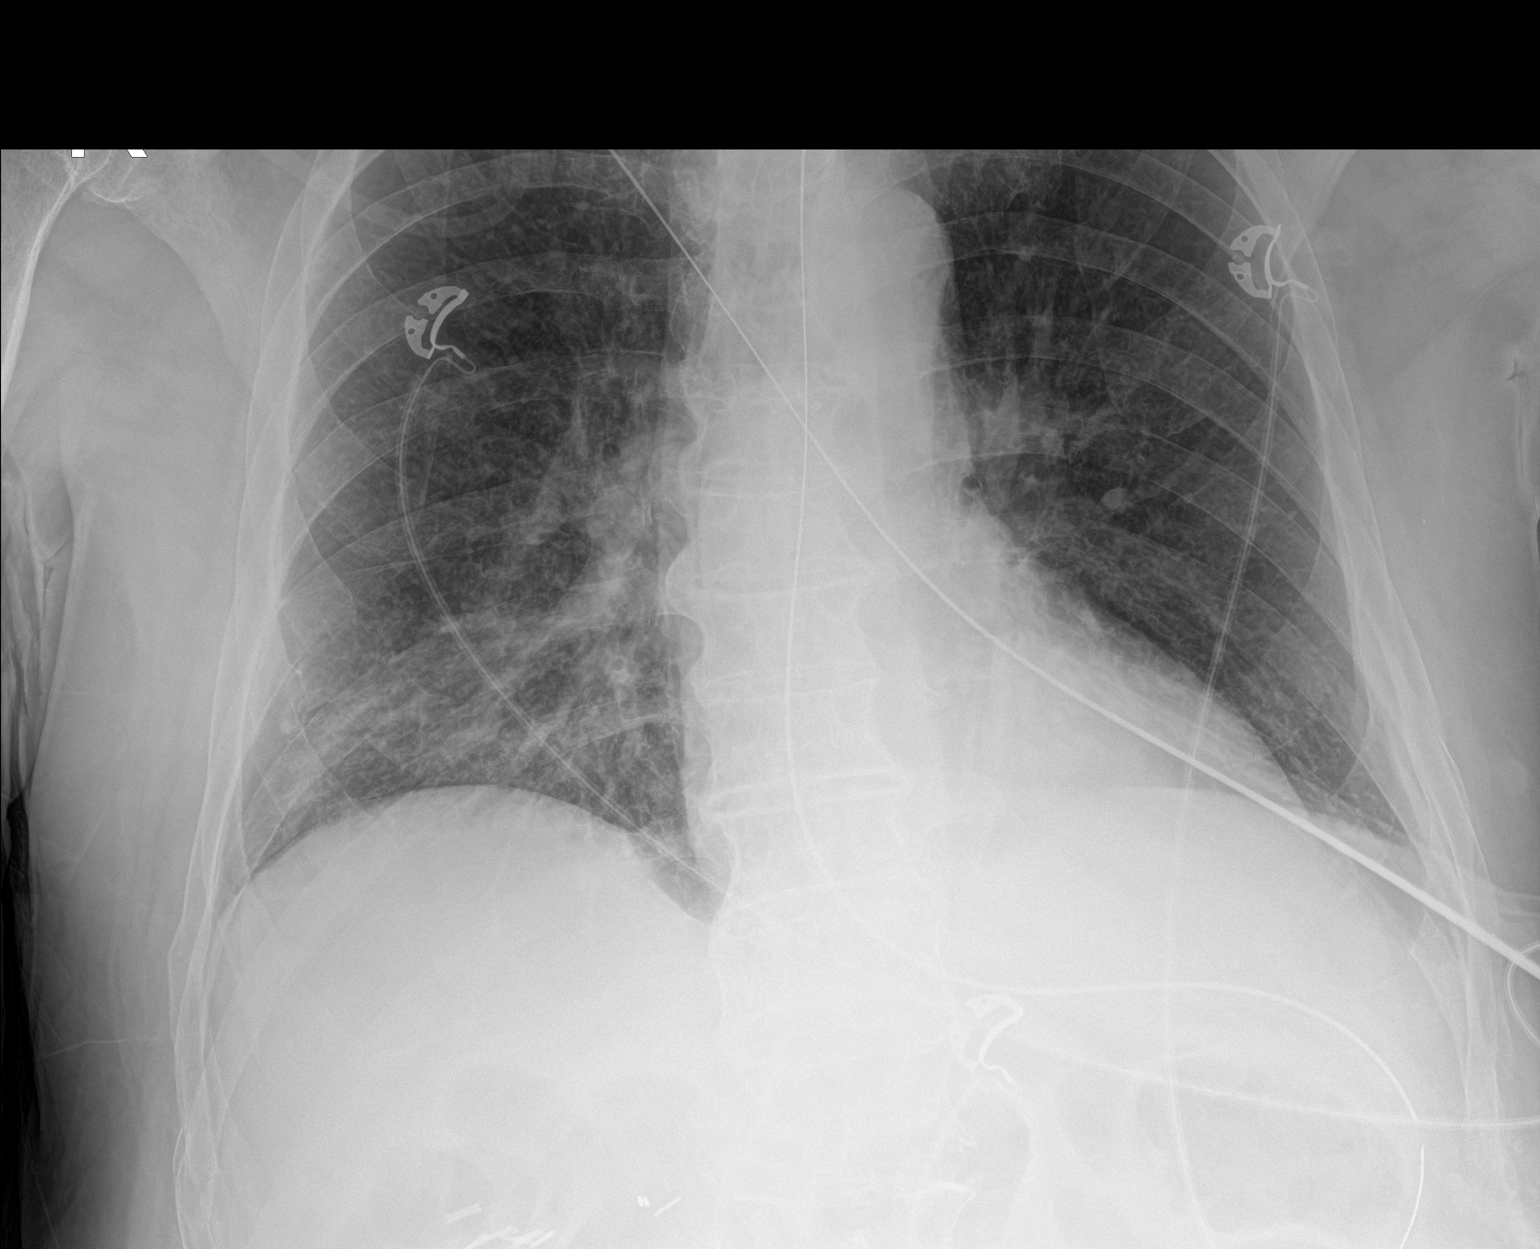
[im 2/2]
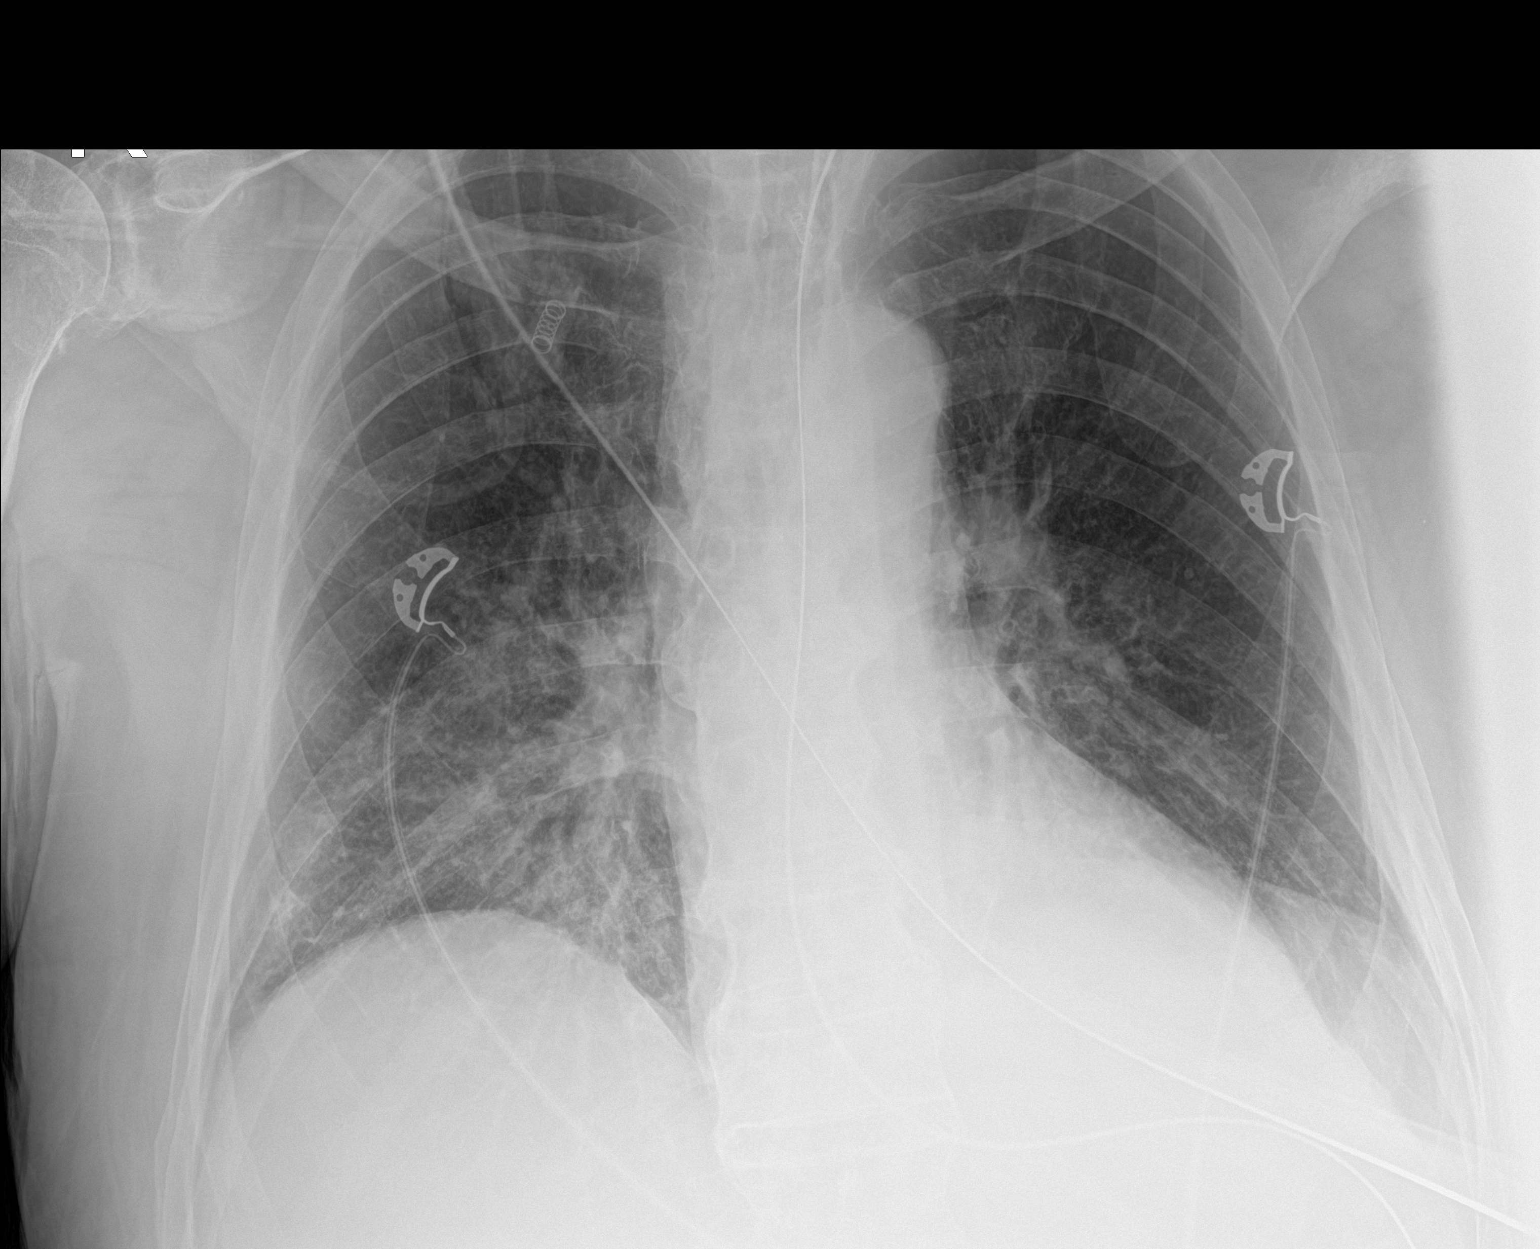

[2 of 2 positions shown; findings below may reference images not displayed]

FINDINGS: The endotracheal tube is 6 cm above the carina. The NG tube is
coursing down the esophagus and into the stomach.

Persistent bibasilar infiltrates but improved aeration since prior
film. Possible small left effusion.
IMPRESSION: 1. Stable support apparatus.
2. Persistent bibasilar infiltrates but slight improved overall
aeration.

## 2019-09-02 MED ORDER — LACTATED RINGERS IV BOLUS
500.0000 mL | Freq: Once | INTRAVENOUS | Status: AC
  Administered 2019-09-02: 500 mL via INTRAVENOUS

## 2019-09-02 MED ORDER — ORAL CARE MOUTH RINSE
15.0000 mL | Freq: Two times a day (BID) | OROMUCOSAL | Status: DC
  Administered 2019-09-02 – 2019-09-07 (×11): 15 mL via OROMUCOSAL

## 2019-09-02 MED ORDER — CHLORHEXIDINE GLUCONATE 0.12 % MT SOLN
15.0000 mL | Freq: Two times a day (BID) | OROMUCOSAL | Status: DC
  Administered 2019-09-03 – 2019-09-09 (×11): 15 mL via OROMUCOSAL
  Filled 2019-09-02 (×11): qty 15

## 2019-09-02 MED ORDER — VITAL 1.5 CAL PO LIQD
1000.0000 mL | ORAL | Status: DC
  Filled 2019-09-02: qty 1000

## 2019-09-02 MED ORDER — PRO-STAT SUGAR FREE PO LIQD: 30.0000 mL | Freq: Three times a day (TID) | ORAL | Status: DC

## 2019-09-02 MED ORDER — LACTATED RINGERS IV SOLN: INTRAVENOUS | Status: DC

## 2019-09-02 NOTE — Progress Notes (Signed)
-  NAME:  John Blackburn, MRN:  [id number], DOB:  05/21/1946, LOS: 1 ADMISSION DATE:  08/31/2019, CONSULTATION DATE:  09/01/19 CHIEF COMPLAINT:  Hypoxic respiratory failure   Brief History   73 yo male brought from SNF to ER after episode of nausea/vomiting and dyspnea.  Had altered mental status and hypoxia in ER and required intubation.  Concern for aspiration pneumonia.  COVID 19 positive.  Had out of hospital DNR form (wasn't available in ER prior to his intubation).   Past Medical History  HTN, HLD, GERD, Depression, CAD s/p stent, PE not on anticoagulation, OSA, Diastolic CHF, Memory loss  Significant Hospital Events   12/26 Admit  Consults:    Procedures:  ETT 12/26 >> 12/28  Significant Diagnostic Tests:  CT angio chest 12/27 >> atherosclerosis, no PE, 4.2 cm ascending aorta, dense consolidation in lung bases b/l, patchy ASD in other areas  Micro Data:  SARS CoV2 PCR 12/26 >> positive Influenza PCR 12/26 >> negative A and B Sputum 12/27 >> rare gpc Blood 12/26 >> ngtd  Antimicrobials:  Zosyn 12/26 >>   COVID treatment:  Remdesivir 12/26 >> Decadron 12/26 >>   Interim history/subjective:  12/28: Remains on full vent support. But on sbt and doing well. Hopeful for extubation later today.   Objective   Blood pressure (!) 168/111, pulse 70, temperature 97.6 F (36.4 C), temperature source Oral, resp. rate 20, height 5\' 11"  (1.803 m), weight 97.2 kg, SpO2 100 %.    Vent Mode: PSV;CPAP FiO2 (%):  [40 %] 40 % Set Rate:  [12 bmp] 12 bmp Vt Set:  [600 mL] 600 mL PEEP:  [5 cmH20-8 cmH20] 5 cmH20 Pressure Support:  [5 cmH20-10 cmH20] 5 cmH20 Plateau Pressure:  [18 cmH20-20 cmH20] 18 cmH20   Intake/Output Summary (Last 24 hours) at 09/02/2019 1231 Last data filed at 09/02/2019 1200 Gross per 24 hour  Intake 2454.93 ml  Output 970 ml  Net 1484.93 ml   Filed Weights   08/31/19 2245 09/01/19 0400 09/02/19 0411  Weight: 100.7 kg 96.3 kg 97.2 kg     Examination:  General - awake and following commands Eyes - pupils reactive, sclera anicteric ENT - ETT in place Cardiac - regular rate/rhythm, no murmur Chest - basilar crackles Abdomen - soft, non tender, + bowel sounds Extremities - no cyanosis, clubbing, or edema; decreased muscle bulk Skin - no rashes Neuro - awake and following commands.   Resolved Hospital Problem list   Lactic acidosis  Assessment & Plan:   Acute hypoxic respiratory failure from aspiration pneumonia and COVID 19 pneumonia. - goal SpO2 > 90% - f/u CXR - day 3 of zosyn - day 3 of decadron, remdesivir - zinc, vit C, Vit D  Acute metabolic encephalopathy from hypoxic respiratory failure. Memory loss. - RASS goal 0 to -1  Hx of CAD s/p stent, HLD, chronic diastolic CHF. - continue ASA, plavix, lipitor  Hypokalemia. - replace  Steroid induced hyperglycemia. - SSI  Arf:  -follow indices and uop    Best practice:  Diet: tube feeds DVT prophylaxis: SQ heparin GI prophylaxis: protonix Mobility: bed rest Code Status: DNR Disposition: ICU Family communication": updated family today via phone. They expressed frustration that I was first person to d/w them covid + of their loved one. I explained he has been positive since prior to 12/26 as our result was positive them. They state that the RN they spoke with yesterday stated he was negative. I attempted to provide encouragement that pt  is improved at this time and that while he is positive for covid I was relaying improvement. They expressed understanding and relief of improvement but were appropriately frustrated over the miscommuncation that occurred prior to today. He remains DNRDNI  Labs    CMP Latest Ref Rng & Units 09/02/2019 09/01/2019 09/01/2019  Glucose 70 - 99 mg/dL 170(H) 206(H) -  BUN 8 - 23 mg/dL 33(H) 19 -  Creatinine 0.61 - 1.24 mg/dL 1.18 0.95 0.87  Sodium 135 - 145 mmol/L 139 140 -  Potassium 3.5 - 5.1 mmol/L 4.0 3.1(L) -   Chloride 98 - 111 mmol/L 102 108 -  CO2 22 - 32 mmol/L 24 20(L) -  Calcium 8.9 - 10.3 mg/dL 8.9 8.4(L) -  Total Protein 6.5 - 8.1 g/dL 6.7 - -  Total Bilirubin 0.3 - 1.2 mg/dL 0.6 - -  Alkaline Phos 38 - 126 U/L 60 - -  AST 15 - 41 U/L 29 - -  ALT 0 - 44 U/L 29 - -    CBC Latest Ref Rng & Units 09/02/2019 09/01/2019 09/01/2019  WBC 4.0 - 10.5 K/uL 20.4(H) 11.3(H) -  Hemoglobin 13.0 - 17.0 g/dL 14.7 16.1 15.3  Hematocrit 39.0 - 52.0 % 44.0 46.9 45.0  Platelets 150 - 400 K/uL 178 189 -    ABG    Component Value Date/Time   PHART 7.395 09/01/2019 0008   PCO2ART 38.1 09/01/2019 0008   PO2ART 139.0 (H) 09/01/2019 0008   HCO3 23.4 09/01/2019 0008   TCO2 25 09/01/2019 0008   ACIDBASEDEF 1.0 09/01/2019 0008   O2SAT 99.0 09/01/2019 0008    CBG (last 3)  Recent Labs    09/02/19 0323 09/02/19 0823 09/02/19 1110  GLUCAP 147* 143* 181*    Critical care time: The patient is critically ill with multiple organ systems failure and requires high complexity decision making for assessment and support, frequent evaluation and titration of therapies, application of advanced monitoring technologies and extensive interpretation of multiple databases.  Critical care time 43 mins. This represents my time independent of the NPs time taking care of the pt. This is excluding procedures.    [facility] 09/02/2019, 12:31 PM

## 2019-09-02 NOTE — Progress Notes (Signed)
Initial Nutrition Assessment  RD working remotely.  DOCUMENTATION CODES:   Not applicable  INTERVENTION:   Tube feeding: - Vital 1.5 @ 45 ml/hr (1080 ml/day) via OG tube - Pro-stat 30 ml TID  Tube feeding regimen provides 1920 kcal, 118 grams of protein, and 825 ml of H2O (104% of kcal needs, 100% of protein needs).  NUTRITION DIAGNOSIS:   Inadequate oral intake related to inability to eat as evidenced by NPO status.  GOAL:   Patient will meet greater than or equal to 90% of their needs  MONITOR:   Vent status, Labs, Weight trends, TF tolerance  REASON FOR ASSESSMENT:   Ventilator, Consult Enteral/tube feeding initiation and management  ASSESSMENT:   73 year old male who presented from SNF on 12/26 with SOB. Pt required intubation in the ED and tested positive for COVID-19. PMH of depression, CAD, PE, CHF, HTN, HLD, GERD.   RD consulted for TF initiation and management. Adult ICU Tube Feeding Protocol initiated by MD on 12/27. RD to adjust to better meet pt's needs.  OG tube in place with TF currently infusing.  Per weight history in chart, pt with a 5.1 kg weight loss over the last 6 months. This is a 5% weight loss which is not significant for timeframe but is concerning.  No edema present per RN edema assessment.  Patient is currently intubated on ventilator support MV: 6.8 L/min Temp (24hrs), Avg:97.6 F (36.4 C), Min:97 F (36.1 C), Max:97.9 F (36.6 C) BP (cuff): 152/84 MAP (cuff): 105  Drips: Propofol: off this AM LR: 50 ml/hr  Medications reviewed and include: vitamin C, cholecalciferol, decadron, SSI q 4 hours, protonix, zinc sulfate, IV abx, remdesivir  Labs reviewed: BUN 33 CBG's: 127-185 x 24 hours  UOP: 695 ml x 24 hours I/O's: +2.0 L since admit  NUTRITION - FOCUSED PHYSICAL EXAM:  Unable to complete at this time. RD working remotely.  Diet Order:   Diet Order            Diet NPO time specified  Diet effective now               EDUCATION NEEDS:   No education needs have been identified at this time  Skin:  Skin Assessment: Reviewed RN Assessment  Last BM:  no documented BM  Height:   Ht Readings from Last 1 Encounters:  08/31/19 5\' 11"  (1.803 m)    Weight:   Wt Readings from Last 1 Encounters:  09/02/19 97.2 kg    Ideal Body Weight:  78.2 kg  BMI:  Body mass index is 29.89 kg/m.  Estimated Nutritional Needs:   Kcal:  1843  Protein:  115-130 grams  Fluid:  >/= 1.8 L    Gaynell Face, MS, RD, LDN Inpatient Clinical Dietitian Pager: [phone] Weekend/After Hours: [phone]

## 2019-09-02 NOTE — Progress Notes (Signed)
[facility] Progress Note Patient Name: John Blackburn DOB: 07/24/1946 MRN: [id number]   Date of Service  09/02/2019  HPI/Events of Note  Low urine output, not on feed or fluids  eICU Interventions  LR bolus and then low maintenance fluids ordered     Intervention Category Major Interventions: Hypovolemia - evaluation and treatment with fluids  Margaretmary Lombard 09/02/2019, 12:27 AM

## 2019-09-02 NOTE — Procedures (Signed)
Extubation Procedure Note  Patient Details:   Name: John Blackburn DOB: 1946-04-08 MRN: [id number]   Airway Documentation:    Vent end date: 09/02/19 Vent end time: 1245   Evaluation  O2 sats: stable throughout Complications: No apparent complications Patient did tolerate procedure well. Bilateral Breath Sounds: Clear, Diminished   Yes   Patient extubated per MD order. Positive cuff leak. No stridor noted. RN at bedside. Patient tolerating well on 4L [place]. Vitals are stable.   Herbie Saxon Rogers Ditter 09/02/2019, 12:50 PM

## 2019-09-03 LAB — CBC
HCT: 45.6 % (ref 39.0–52.0)
Hemoglobin: 15.5 g/dL (ref 13.0–17.0)
MCH: 31.8 pg (ref 26.0–34.0)
MCHC: 34 g/dL (ref 30.0–36.0)
MCV: 93.6 fL (ref 80.0–100.0)
Platelets: 195 10*3/uL (ref 150–400)
RBC: 4.87 MIL/uL (ref 4.22–5.81)
RDW: 14.7 % (ref 11.5–15.5)
WBC: 19.1 10*3/uL — ABNORMAL HIGH (ref 4.0–10.5)
nRBC: 0 % (ref 0.0–0.2)

## 2019-09-03 LAB — COMPREHENSIVE METABOLIC PANEL
ALT: 31 U/L (ref 0–44)
AST: 37 U/L (ref 15–41)
Albumin: 3 g/dL — ABNORMAL LOW (ref 3.5–5.0)
Alkaline Phosphatase: 74 U/L (ref 38–126)
Anion gap: 13 (ref 5–15)
BUN: 17 mg/dL (ref 8–23)
CO2: 28 mmol/L (ref 22–32)
Calcium: 8.6 mg/dL — ABNORMAL LOW (ref 8.9–10.3)
Chloride: 100 mmol/L (ref 98–111)
Creatinine, Ser: 0.69 mg/dL (ref 0.61–1.24)
GFR calc Af Amer: >60 mL/min (ref >60)
GFR calc non Af Amer: >60 mL/min (ref >60)
Glucose, Bld: 92 mg/dL (ref 70–99)
Potassium: 3 mmol/L — ABNORMAL LOW (ref 3.5–5.1)
Sodium: 141 mmol/L (ref 135–145)
Total Bilirubin: 0.8 mg/dL (ref 0.3–1.2)
Total Protein: 7 g/dL (ref 6.5–8.1)

## 2019-09-03 LAB — GLUCOSE, CAPILLARY
Glucose-Capillary: 78 mg/dL (ref 70–99)
Glucose-Capillary: 107 mg/dL — ABNORMAL HIGH (ref 70–99)
Glucose-Capillary: 98 mg/dL (ref 70–99)
Glucose-Capillary: 114 mg/dL — ABNORMAL HIGH (ref 70–99)
Glucose-Capillary: 115 mg/dL — ABNORMAL HIGH (ref 70–99)
Glucose-Capillary: 170 mg/dL — ABNORMAL HIGH (ref 70–99)

## 2019-09-03 LAB — CULTURE, RESPIRATORY W GRAM STAIN: Culture: NORMAL

## 2019-09-03 LAB — TRIGLYCERIDES: Triglycerides: 98 mg/dL (ref <150)

## 2019-09-03 MED ORDER — POTASSIUM CHLORIDE 10 MEQ/100ML IV SOLN
10.0000 meq | INTRAVENOUS | Status: AC
  Administered 2019-09-03 (×3): 10 meq via INTRAVENOUS
  Filled 2019-09-03 (×3): qty 100

## 2019-09-03 MED ORDER — DEXAMETHASONE SODIUM PHOSPHATE 10 MG/ML IJ SOLN
6.0000 mg | Freq: Every day | INTRAMUSCULAR | Status: DC
  Administered 2019-09-03 – 2019-09-09 (×7): 6 mg via INTRAVENOUS
  Filled 2019-09-03 (×7): qty 1

## 2019-09-03 MED ORDER — RESOURCE THICKENUP CLEAR PO POWD
ORAL | Status: DC | PRN
  Filled 2019-09-03: qty 125

## 2019-09-03 MED ORDER — PIPERACILLIN-TAZOBACTAM 3.375 G IVPB
3.3750 g | Freq: Three times a day (TID) | INTRAVENOUS | Status: AC
  Administered 2019-09-03 – 2019-09-06 (×9): 3.375 g via INTRAVENOUS
  Filled 2019-09-03 (×10): qty 50

## 2019-09-03 NOTE — Progress Notes (Signed)
[facility] Progress Note Patient Name: John Blackburn DOB: Apr 04, 1946 MRN: [id number]   Date of Service  09/03/2019  HPI/Events of Note  Pt extubated earlier today and failed swallow evaluation, RN requests that his scheduled Decadron be changed to iv Decadron.  eICU Interventions  Decadron changed to iv route.        Kerry Kass Daina Cara 09/03/2019, 2:40 AM

## 2019-09-03 NOTE — Progress Notes (Signed)
Report called to RN on 5W. Pt transferred via bed with personal belongings. Attempted to call pts son Kaeleb Purnell to make him aware of transfer but no answer so no message left on voicemail.

## 2019-09-03 NOTE — Evaluation (Signed)
Clinical/Bedside Swallow Evaluation Patient Details  Name: John Blackburn MRN: [id number] Date of Birth: 06/11/1946  Today's Date: 09/03/2019 Time: SLP Start Time (ACUTE ONLY): 1555 SLP Stop Time (ACUTE ONLY): 1615 SLP Time Calculation (min) (ACUTE ONLY): 20 min  Past Medical History:  Past Medical History:  Diagnosis Date  . Allergy   . 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 CAD (coronary artery disease)    08/2017 PCI/DES mRCA, RPDA, CTO pf pLCX with collaterals, normal EF  . Depression    on multiple meds. Has been seen at [facility]. and Dr. Sabra Heck is his prescriber  . Diverticulitis of colon 2000 's   treated as an outpatient  . GERD (gastroesophageal reflux disease)    UGI done August '12 - ulcer/. Dr. Ferdinand Lango, gastroenterologist in [facility].   [name] Hx of adenomatous colonic polyps   . Hyperlipidemia    last lipid panel: HDL 44, LDL 117  . Hypertension   . Myocardial infarction ([place])   . Peptic ulcer    in the past and just recently-August '12  . Pulmonary emboli [facility])    noted October 2014 - treated by Dr. Linda Hedges  . skin cancer    skin CA  . Sleep apnea, primary central    wears CPAP   Past Surgical History:  Past Surgical History:  Procedure Laterality Date  . ANAL RECTAL MANOMETRY N/A 11/30/2016   Procedure: ANO RECTAL MANOMETRY;  Surgeon: Mauri Pole, MD;  Location: [facility];  Service: Endoscopy;  Laterality: N/A;  . CARDIAC CATHETERIZATION    . CHOLECYSTECTOMY  1990   laproscopic   . CORONARY STENT INTERVENTION N/A 09/01/2017   Procedure: CORONARY STENT INTERVENTION;  Surgeon: Jettie Booze, MD;  Location: [place] [facility];  Service: Cardiovascular;  Laterality: N/A;  . HERNIA REPAIR    . JOINT REPLACEMENT     knee  . PROSTATE SURGERY     needle biopsy's, TURP  . RIGHT/LEFT HEART CATH AND CORONARY ANGIOGRAPHY N/A 09/01/2017   Procedure: RIGHT/LEFT HEART CATH AND CORONARY ANGIOGRAPHY;  Surgeon: Jettie Booze,  MD;  Location: [place] [facility];  Service: Cardiovascular;  Laterality: N/A;  . TKR bilateral  2006   Dr. Violet Baldy  . UPPER GASTROINTESTINAL ENDOSCOPY    . VASECTOMY     HPI:  Pt is a 73 yo male admitted from SNF after an episode of nausea/vomiting and dyspnea. He was intubated in the ED (12/26-12/28) given AMS and hypoxia with concern for aspiration PNA. He tested postitive for COVID-19. PMH: HTN, HLD, GERD, depression, CAD s/p stent, PE, OSA, dCHF, memory loss. Pt was evaluated for swallowing earlier this year with MBS in July showing residue in the valleculae (increasing with more solid textures); minimal residue at the cervical esophagus; flash penetration of thin liquids; hesitancy of pill in the esophagus. Dys 3 diet and thin liquids, meds whole in puree recommended at that time.   Assessment / Plan / Recommendation Clinical Impression  Pt had a mild but functional pharyngoesophageal dysphagia during MBS earlier this year, and presents today with what appears to be an acute exacerbation in the setting of intubation decreased functional reserve from acute illness. He has multiple, audible swallows with thin liquids, outwardly appearing to have poor coordination with sequential boluses that leads to prolonged, wet coughing. Pt has poor quality dentition and has prolonged mastication, but also describes feeling like food (and maybe drinks) are "coming back up" after he swallows. Recommend  starting with full liquid diet thickened to nectar thick consistency. SLP will f/u for potential to advance.  SLP Visit Diagnosis: Dysphagia, pharyngoesophageal phase (R13.14)    Aspiration Risk  Mild aspiration risk;Moderate aspiration risk    Diet Recommendation Nectar-thick liquid   Liquid Administration via: Cup;Straw Medication Administration: Crushed with puree Supervision: Staff to assist with self feeding;Full supervision/cueing for compensatory strategies Compensations: Minimize  environmental distractions;Slow rate;Small sips/bites;Follow solids with liquid Postural Changes: Seated upright at 90 degrees;Remain upright for at least 30 minutes after po intake    Other  Recommendations Oral Care Recommendations: Oral care BID Other Recommendations: Order thickener from pharmacy;Prohibited food (jello, ice cream, thin soups);Remove water pitcher;Have oral suction available   Follow up Recommendations Skilled Nursing facility      Frequency and Duration min 2x/week  2 weeks       Prognosis Prognosis for Safe Diet Advancement: Good      Swallow Study   General HPI: Pt is a 73 yo male admitted from SNF after an episode of nausea/vomiting and dyspnea. He was intubated in the ED (12/26-12/28) given AMS and hypoxia with concern for aspiration PNA. He tested postitive for COVID-19. PMH: HTN, HLD, GERD, depression, CAD s/p stent, PE, OSA, dCHF, memory loss. Pt was evaluated for swallowing earlier this year with MBS in July showing residue in the valleculae (increasing with more solid textures); minimal residue at the cervical esophagus; flash penetration of thin liquids; hesitancy of pill in the esophagus. Dys 3 diet and thin liquids, meds whole in puree recommended at that time. Type of Study: Bedside Swallow Evaluation Previous Swallow Assessment: see HPI Diet Prior to this Study: NPO Temperature Spikes Noted: No Respiratory Status: Room air History of Recent Intubation: Yes Length of Intubations (days): 2 days Date extubated: 09/02/19 Behavior/Cognition: Alert;Cooperative;Requires cueing;Other (Comment)(slow to respond, flat affect) Oral Cavity Assessment: Other (comment)(coating on tongue) Oral Care Completed by SLP: No Oral Cavity - Dentition: Poor condition;Missing dentition Vision: Functional for self-feeding Self-Feeding Abilities: Needs assist Patient Positioning: Upright in bed Baseline Vocal Quality: Normal Volitional Cough: Strong Volitional Swallow:  Unable to elicit    Oral/Motor/Sensory Function Overall Oral Motor/Sensory Function: Within functional limits   Ice Chips Ice chips: Within functional limits Presentation: Spoon   Thin Liquid Thin Liquid: Impaired Presentation: Cup;Self Fed;Straw Pharyngeal  Phase Impairments: Suspected delayed Swallow;Multiple swallows;Cough - Immediate    Nectar Thick Nectar Thick Liquid: Within functional limits Presentation: Self Fed;Straw   Honey Thick Honey Thick Liquid: Not tested   Puree Puree: Within functional limits Presentation: Self Fed;Spoon   Solid     Solid: Impaired Presentation: Self Fed Oral Phase Impairments: Impaired mastication       Osie Bond., M.A. [facility] Pager [phone] Office [phone]  09/03/2019,5:24 PM

## 2019-09-03 NOTE — Progress Notes (Signed)
Assisted tele visit to patient with son.  John Christmas Anderson, RN   

## 2019-09-03 NOTE — Progress Notes (Addendum)
-  NAME:  John Blackburn, MRN:  [id number], DOB:  1945/12/20, LOS: 2 ADMISSION DATE:  08/31/2019, CONSULTATION DATE:  09/01/19 CHIEF COMPLAINT:  Hypoxic respiratory failure   Brief History   73 yo male brought from SNF to ER after episode of nausea/vomiting and dyspnea.  Had altered mental status and hypoxia in ER and required intubation.  Concern for aspiration pneumonia.  COVID 19 positive.  Had out of hospital DNR form (wasn't available in ER prior to his intubation).   Past Medical History  HTN, HLD, GERD, Depression, CAD s/p stent, PE not on anticoagulation, OSA, Diastolic CHF, Memory loss  Significant Hospital Events   12/26 Admit  Consults:    Procedures:  ETT 12/26 >> 12/28  Significant Diagnostic Tests:  CT angio chest 12/27 >> atherosclerosis, no PE, 4.2 cm ascending aorta, dense consolidation in lung bases b/l, patchy ASD in other areas  Micro Data:  SARS CoV2 PCR 12/26 >> positive Influenza PCR 12/26 >> negative A and B Sputum 12/27 >> rare gpc Blood 12/26 >> ngtd  Antimicrobials:  Zosyn 12/26 >>   COVID treatment:  Remdesivir 12/26 >> Decadron 12/26 >>   Interim history/subjective:  12/29: doing well extubated yesterday. Stable for transfer to floor. Will have TRH to assume care and CCM will sign off.  12/28: Remains on full vent support. But on sbt and doing well. Hopeful for extubation later today.   Objective   Blood pressure (!) 160/91, pulse 72, temperature 98.8 F (37.1 C), temperature source Oral, resp. rate (!) 23, height 5\' 11"  (1.803 m), weight 95.4 kg, SpO2 97 %.    FiO2 (%):  [40 %] 40 % Pressure Support:  [5 cmH20] 5 cmH20   Intake/Output Summary (Last 24 hours) at 09/03/2019 1113 Last data filed at 09/03/2019 1100 Gross per 24 hour  Intake 1556.36 ml  Output 3600 ml  Net -2043.64 ml   Filed Weights   09/01/19 0400 09/02/19 0411 09/03/19 0319  Weight: 96.3 kg 97.2 kg 95.4 kg    Examination:  General - awake and following  commands Eyes - pupils reactive, sclera anicteric ENT - ETT in place Cardiac - regular rate/rhythm, no murmur Chest - basilar crackles Abdomen - soft, non tender, + bowel sounds Extremities - no cyanosis, clubbing, or edema; decreased muscle bulk Skin - no rashes Neuro - awake and following commands.   Resolved Hospital Problem list   Lactic acidosis  Assessment & Plan:   Acute hypoxic respiratory failure from aspiration pneumonia and COVID 19 pneumonia. - goal SpO2 > 90% - f/u CXR periodically - zosyn for 7 days - day 4 of decadron, remdesivir - zinc, vit C, Vit D  Acute metabolic encephalopathy from hypoxic respiratory failure. Memory loss. - improved, baseline dementia  Hx of CAD s/p stent, HLD, chronic diastolic CHF. - continue ASA, plavix, lipitor  Hypokalemia. -replace -awaiting PO eval to give enteral rather than IV, for now will do IV  Steroid induced hyperglycemia. - SSI  Arf:  -follow indices and uop -improving    Best practice:  Diet: awaiting speech DVT prophylaxis: SQ heparin GI prophylaxis: protonix Mobility: bed rest, will have PT come see.  Code Status: DNR Disposition: med tele Family communication: updated family today via phone.   Labs    CMP Latest Ref Rng & Units 09/03/2019 09/02/2019 09/01/2019  Glucose 70 - 99 mg/dL 92 170(H) 206(H)  BUN 8 - 23 mg/dL 17 33(H) 19  Creatinine 0.61 - 1.24 mg/dL 0.69 1.18 0.95  Sodium 135 - 145 mmol/L 141 139 140  Potassium 3.5 - 5.1 mmol/L 3.0(L) 4.0 3.1(L)  Chloride 98 - 111 mmol/L 100 102 108  CO2 22 - 32 mmol/L 28 24 20(L)  Calcium 8.9 - 10.3 mg/dL 8.6(L) 8.9 8.4(L)  Total Protein 6.5 - 8.1 g/dL 7.0 6.7 -  Total Bilirubin 0.3 - 1.2 mg/dL 0.8 0.6 -  Alkaline Phos 38 - 126 U/L 74 60 -  AST 15 - 41 U/L 37 29 -  ALT 0 - 44 U/L 31 29 -    CBC Latest Ref Rng & Units 09/03/2019 09/02/2019 09/01/2019  WBC 4.0 - 10.5 K/uL 19.1(H) 20.4(H) 11.3(H)  Hemoglobin 13.0 - 17.0 g/dL 15.5 14.7 16.1   Hematocrit 39.0 - 52.0 % 45.6 44.0 46.9  Platelets 150 - 400 K/uL 195 178 189    ABG    Component Value Date/Time   PHART 7.395 09/01/2019 0008   PCO2ART 38.1 09/01/2019 0008   PO2ART 139.0 (H) 09/01/2019 0008   HCO3 23.4 09/01/2019 0008   TCO2 25 09/01/2019 0008   ACIDBASEDEF 1.0 09/01/2019 0008   O2SAT 99.0 09/01/2019 0008    CBG (last 3)  Recent Labs    09/03/19 0319 09/03/19 0736 09/03/19 0832  GLUCAP 78 107* 98    99233, 19min care time.     Audria Nine DO [facility] 09/03/2019, 11:13 AM

## 2019-09-04 LAB — COMPREHENSIVE METABOLIC PANEL
ALT: 34 U/L (ref 0–44)
AST: 35 U/L (ref 15–41)
Albumin: 2.9 g/dL — ABNORMAL LOW (ref 3.5–5.0)
Alkaline Phosphatase: 73 U/L (ref 38–126)
Anion gap: 12 (ref 5–15)
BUN: 15 mg/dL (ref 8–23)
CO2: 27 mmol/L (ref 22–32)
Calcium: 8.7 mg/dL — ABNORMAL LOW (ref 8.9–10.3)
Chloride: 102 mmol/L (ref 98–111)
Creatinine, Ser: 0.88 mg/dL (ref 0.61–1.24)
GFR calc Af Amer: >60 mL/min (ref >60)
GFR calc non Af Amer: >60 mL/min (ref >60)
Glucose, Bld: 110 mg/dL — ABNORMAL HIGH (ref 70–99)
Potassium: 3.4 mmol/L — ABNORMAL LOW (ref 3.5–5.1)
Sodium: 141 mmol/L (ref 135–145)
Total Bilirubin: 0.8 mg/dL (ref 0.3–1.2)
Total Protein: 6.6 g/dL (ref 6.5–8.1)

## 2019-09-04 LAB — GLUCOSE, CAPILLARY
Glucose-Capillary: 105 mg/dL — ABNORMAL HIGH (ref 70–99)
Glucose-Capillary: 118 mg/dL — ABNORMAL HIGH (ref 70–99)
Glucose-Capillary: 211 mg/dL — ABNORMAL HIGH (ref 70–99)
Glucose-Capillary: 215 mg/dL — ABNORMAL HIGH (ref 70–99)
Glucose-Capillary: 227 mg/dL — ABNORMAL HIGH (ref 70–99)
Glucose-Capillary: 95 mg/dL (ref 70–99)
Glucose-Capillary: 97 mg/dL (ref 70–99)
Glucose-Capillary: 98 mg/dL (ref 70–99)

## 2019-09-04 LAB — CBC
HCT: 44.7 % (ref 39.0–52.0)
Hemoglobin: 15.4 g/dL (ref 13.0–17.0)
MCH: 32 pg (ref 26.0–34.0)
MCHC: 34.5 g/dL (ref 30.0–36.0)
MCV: 92.9 fL (ref 80.0–100.0)
Platelets: 197 10*3/uL (ref 150–400)
RBC: 4.81 MIL/uL (ref 4.22–5.81)
RDW: 14.6 % (ref 11.5–15.5)
WBC: 14.5 10*3/uL — ABNORMAL HIGH (ref 4.0–10.5)
nRBC: 0 % (ref 0.0–0.2)

## 2019-09-04 LAB — FERRITIN: Ferritin: 362 ng/mL — ABNORMAL HIGH (ref 24–336)

## 2019-09-04 LAB — C-REACTIVE PROTEIN: CRP: 8.1 mg/dL — ABNORMAL HIGH (ref <1.0)

## 2019-09-04 MED ORDER — POTASSIUM CHLORIDE 20 MEQ/15ML (10%) PO SOLN
20.0000 meq | Freq: Once | ORAL | Status: AC
  Administered 2019-09-04: 20 meq
  Filled 2019-09-04: qty 15

## 2019-09-04 MED ORDER — POTASSIUM CHLORIDE CRYS ER 20 MEQ PO TBCR: 20.0000 meq | EXTENDED_RELEASE_TABLET | Freq: Once | ORAL | Status: DC

## 2019-09-04 NOTE — Evaluation (Addendum)
Physical Therapy Evaluation Patient Details Name: John Blackburn MRN: [id number] DOB: [birth year] Today's Date: 09/04/2019   History of Present Illness  73 yo male admitted from SNF after an episode of nausea/vomiting and dyspnea. He was intubated in the ED (12/26-12/28) given AMS and hypoxia with concern for aspiration PNA. He tested postitive for COVID-19. PMH: HTN, HLD, GERD, depression, CAD s/p stent, PE, OSA, dCHF, memory loss, lt occipital lobe CVA, bil TKR  Clinical Impression   Pt admitted with above diagnosis. Patient resident of SNF since July 2020 discharge from hospital s/p CVA. Pt reports working on walking with walker at SNF, however has memory loss and therefore not reliable. He has a slight PF contracture of left ankle, so ? If ambulatory. Anticipate he is not at his baseline for transfers and can benefit from PT. Pt currently with functional limitations due to the deficits listed below (see PT Problem List). Pt will benefit from skilled PT to increase their independence and safety with mobility to allow discharge to the venue listed below.       Follow Up Recommendations SNF    Equipment Recommendations  None recommended by PT    Recommendations for Other Services       Precautions / Restrictions Precautions Precautions: Fall Precaution Comments: COVID+ Restrictions Weight Bearing Restrictions: No      Mobility  Bed Mobility Overal bed mobility: Needs Assistance Bed Mobility: Rolling;Sidelying to Sit;Sit to Sidelying Rolling: Mod assist Sidelying to sit: Max assist;HOB elevated(with rail)     Sit to sidelying: Max assist General bed mobility comments: +2 would be best, but with bed pad able to get to sit EOB with 1 person  Transfers Overall transfer level: Needs assistance Equipment used: None Transfers: Lateral/Scoot Transfers          Lateral/Scoot Transfers: Min assist;Mod assist General transfer comment: along EOB with bed pad; bed elevated due  to pt's height; 3 scoots with each getting better as he understood  Ambulation/Gait             General Gait Details: unable  Stairs            Wheelchair Mobility    Modified Rankin (Stroke Patients Only)       Balance Overall balance assessment: Needs assistance Sitting-balance support: Single extremity supported;Feet supported Sitting balance-Leahy Scale: Poor Sitting balance - Comments: initial rt lean but progressed to only LUE support with closeguarding for up to 3 minutes at a time                                     Pertinent Vitals/Pain Pain Assessment: No/denies pain    Home Living Family/patient expects to be discharged to:: Skilled nursing facility                 Additional Comments: was discharged to SNF from hospital in July 2020; prior lived at home    Prior Function Level of Independence: Needs assistance   Gait / Transfers Assistance Needed: pt reports working on walking with RW, but ?reliability (currently unable)           Hand Dominance        Extremity/Trunk Assessment   Upper Extremity Assessment Upper Extremity Assessment: Defer to OT evaluation    Lower Extremity Assessment Lower Extremity Assessment: Generalized weakness;RLE deficits/detail;LLE deficits/detail RLE Deficits / Details: s/p TKR with  knee ROM ~0-100; ankle DF WFL; strength  grossly 3+-4 throughout LLE Deficits / Details: s/p TKR with  knee ROM ~0-100; ankle DF -10 degrees to achieve neutral; strength grossly 3+ throughout    Cervical / Trunk Assessment Cervical / Trunk Assessment: Kyphotic  Communication   Communication: No difficulties  Cognition Arousal/Alertness: Awake/alert Behavior During Therapy: Flat affect Overall Cognitive Status: No family/caregiver present to determine baseline cognitive functioning                                 General Comments: h/o cognitive impairments, unsure if at baseline; states  name, DOB, year correctly; otherwise not oriented      General Comments General comments (skin integrity, edema, etc.): Pt with pulse ox on rt 1st toe on room air reading 89% at rest and 86-87% at EOB. Patient denied SOB, no incr WOB, +wet cough while sitting. Returned to chair position in bed    Exercises     Assessment/Plan    PT Assessment Patient needs continued PT services  PT Problem List Decreased strength;Decreased range of motion;Decreased balance;Decreased mobility;Decreased cognition;Decreased knowledge of use of DME;Cardiopulmonary status limiting activity       PT Treatment Interventions DME instruction;Gait training;Functional mobility training;Therapeutic activities;Therapeutic exercise;Balance training;Cognitive remediation;Patient/family education    PT Goals (Current goals can be found in the Care Plan section)  Acute Rehab PT Goals Patient Stated Goal: to get stronger and walk PT Goal Formulation: With patient Time For Goal Achievement: 09/18/19 Potential to Achieve Goals: Fair    Frequency Min 2X/week   Barriers to discharge        Co-evaluation               AM-PAC PT "6 Clicks" Mobility  Outcome Measure Help needed turning from your back to your side while in a flat bed without using bedrails?: A Lot Help needed moving from lying on your back to sitting on the side of a flat bed without using bedrails?: Total Help needed moving to and from a bed to a chair (including a wheelchair)?: Total Help needed standing up from a chair using your arms (e.g., wheelchair or bedside chair)?: Total Help needed to walk in hospital room?: Total Help needed climbing 3-5 steps with a railing? : Total 6 Click Score: 7    End of Session   Activity Tolerance: Patient tolerated treatment well Patient left: in bed;with call bell/phone within reach;with bed alarm set;with nursing/sitter in room Nurse Communication: Mobility status;Other (comment)(recommend bedpan (if  pt continent)) PT Visit Diagnosis: Muscle weakness (generalized) (M62.81)    Time: [id number] PT Time Calculation (min) (ACUTE ONLY): 30 min   Charges:     PT Treatments $Therapeutic Activity: 8-22 mins         Arby Barrette, PT Pager [phone]   Rexanne Mano 09/04/2019, 9:57 AM

## 2019-09-04 NOTE — Progress Notes (Signed)
Nutrition Follow-up  RD working remotely.  DOCUMENTATION CODES:   Not applicable  INTERVENTION:   - Magic cup TID with meals, each supplement provides 290 kcal and 9 grams of protein  - Vital Cuisine Shake TID with meals, each supplement provides 520 kcal and 22 grams of protein  NUTRITION DIAGNOSIS:   Inadequate oral intake related to inability to eat as evidenced by NPO status.  Progressing, pt now on nectar-thick full liquid diet  GOAL:   Patient will meet greater than or equal to 90% of their needs  Progressing  MONITOR:   PO intake, Supplement acceptance, Diet advancement, Labs, Weight trends  REASON FOR ASSESSMENT:   Ventilator, Consult Enteral/tube feeding initiation and management  ASSESSMENT:   73 year old male who presented from SNF on 12/26 with SOB. Pt required intubation in the ED and tested positive for COVID-19. PMH of depression, CAD, PE, CHF, HTN, HLD, GERD.  12/28 - extubated 12/29 - diet advanced to nectar-thick full liquids  Weight down 13 lbs since first measured weight on 12/27. Suspect weight fluctuations related to fluid status. Will monitor trends.  Unable to reach pt via phone call to room.  RD will order oral nutrition supplements to aid pt in meeting kcal and protein needs during admission.  Meal Completion: 100% x 1 meal (nectar-thick full liquids)  Medications reviewed and include: vitamin C, cholecalciferol, decadron, SSI q 4 hours, protonix, zinc sulfate, IV abx, remdesivir IVF: LR @ 50 ml/hr  Labs reviewed: potassium 3.4 CBG's: 97-170 x 24 hours  UOP: 2630 ml x 24 hours  NUTRITION - FOCUSED PHYSICAL EXAM:  Unable to complete at this time. RD working remotely.  Diet Order:   Diet Order            Diet full liquid Room service appropriate? No; Fluid consistency: Nectar Thick  Diet effective now              EDUCATION NEEDS:   No education needs have been identified at this time  Skin:  Skin Assessment: Reviewed  RN Assessment  Last BM:  09/03/19  Height:   Ht Readings from Last 1 Encounters:  08/31/19 5\' 11"  (1.803 m)    Weight:   Wt Readings from Last 1 Encounters:  09/04/19 90.4 kg    Ideal Body Weight:  78.2 kg  BMI:  Body mass index is 27.8 kg/m.  Estimated Nutritional Needs:   Kcal:  2200-2400  Protein:  115-130 grams  Fluid:  >/= 2.0 L    Gaynell Face, MS, RD, LDN Inpatient Clinical Dietitian Pager: [phone] Weekend/After Hours: [phone]

## 2019-09-04 NOTE — Progress Notes (Addendum)
[facility]  PROGRESS NOTE  John Blackburn John Blackburn:[id number] DOB: 27-Feb-1946 DOA: 08/31/2019 PCP: Hoyt Koch, MD   Brief HPI:   73 year old male with a history of hypertension, hyperlipidemia, GERD, depression, CAD s/p stent placement, PE not on anticoagulation, diastolic CHF, OSA, memory loss was brought to ED after he had episode of nausea vomiting and dyspnea. Patient developed altered mental status and hypoxia in the ED required intubation, was transferred to ICU. He was found to be COVID-19 positive. Patient was extubated on 09/02/2019. [facility] assumed care on 09/04/2019.    Subjective   This morning patient denies chest pain or shortness of breath.   Assessment/Plan:     1. Acute hypoxic respiratory failure-secondary to aspiration pneumonia and Covid pneumonia. Patient is currently on remdesivir, stop after 09/06/2019.He is also on Decadron. Continue vitamin C, zinc. He is also on IV Zosyn. Last CRP was 2.5 on 08/31/2019. We'll repeat inflammatory markers today.  2. Metabolic encephalopathy-secondary to hypoxic respiratory failure, he has memory loss. He slowly improving, patient has baseline dementia. Continue Namenda.  3. History of CAD s/p stent placement, chronic diastolic CHF-continue aspirin, Plavix, Lipitor.  4. Steroid-induced hyperglycemia-continue sliding scale insulin with NovoLog.  5. Hypokalemia-potassium was 3.4, will replace potassium and follow BMP in a.m.    SpO2: 91 % O2 Flow Rate (L/min): 4 L/min FiO2 (%): 40 %     COVID-19 Labs   Lab Results  Component Value Date   SARSCOV2NAA POSITIVE (A) 08/31/2019   [place] NEGATIVE 03/21/2019   [place] NOT DETECTED 03/11/2019     CBG: Recent Labs  Lab 09/04/19 0047 09/04/19 0410 09/04/19 0431 09/04/19 0800 09/04/19 1156  GLUCAP 105* 98 97 118* 227*    CBC: Recent Labs  Lab 08/31/19 2242 09/01/19 0008 09/01/19 0500 09/02/19 0500 09/03/19 0500 09/04/19 0500  WBC 3.9*  --   11.3* 20.4* 19.1* 14.5*  NEUTROABS 2.7  --   --   --   --   --   HGB 16.5 15.3 16.1 14.7 15.5 15.4  HCT 50.2 45.0 46.9 44.0 45.6 44.7  MCV 96.0  --  93.8 95.4 93.6 92.9  PLT 197  --  189 178 195 875    Basic Metabolic Panel: Recent Labs  Lab 08/31/19 2242 09/01/19 0008 09/01/19 0211 09/01/19 0500 09/01/19 1200 09/01/19 1700 09/02/19 0500 09/02/19 1637 09/03/19 0500 09/04/19 0500  NA 139 140  --  140  --   --  139  --  141 141  K 3.4* 3.0*  --  3.1*  --   --  4.0  --  3.0* 3.4*  CL 103  --   --  108  --   --  102  --  100 102  CO2 25  --   --  20*  --   --  24  --  28 27  GLUCOSE 245*  --   --  206*  --   --  170*  --  92 110*  BUN 15  --   --  19  --   --  33*  --  17 15  CREATININE 1.08  --  0.87 0.95  --   --  1.18  --  0.69 0.88  CALCIUM 8.5*  --   --  8.4*  --   --  8.9  --  8.6* 8.7*  MG  --   --   --  2.0 2.2 2.1 2.2 2.2  --   --   PHOS  --   --   --  4.6 4.8* 3.8 3.6 2.1*  --   --      Liver Function Tests: Recent Labs  Lab 08/31/19 2242 09/02/19 0500 09/03/19 0500 09/04/19 0500  AST 45* 29 37 35  ALT 36 29 31 34  ALKPHOS 94 60 74 73  BILITOT 0.4 0.6 0.8 0.8  PROT 6.9 6.7 7.0 6.6  ALBUMIN 3.2* 2.8* 3.0* 2.9*        DVT prophylaxis: Heparin  Code Status: DNR  Family Communication: No family at bedside  Disposition Plan: likely home when medically ready for discharge         Scheduled medications:  . ascorbic acid  250 mg Per Tube Daily  . aspirin  81 mg Per Tube Daily  . atorvastatin  40 mg Per Tube q1800  . chlorhexidine  15 mL Mouth Rinse BID  . chlorhexidine gluconate (MEDLINE KIT)  15 mL Mouth Rinse BID  . Chlorhexidine Gluconate Cloth  6 each Topical Daily  . cholecalciferol  1,000 Units Per Tube Daily  . clopidogrel  75 mg Per Tube Daily  . dexamethasone (DECADRON) injection  6 mg Intravenous Q0600  . escitalopram  10 mg Per Tube Daily  . heparin  5,000 Units Subcutaneous Q8H  . insulin aspart  0-9 Units Subcutaneous Q4H  .  mouth rinse  15 mL Mouth Rinse q12n4p  . pantoprazole sodium  40 mg Per Tube Daily  . zinc sulfate  220 mg Per Tube Daily    Consultants:  PCCM  Procedures:  ETT from 08/31/2019 to 09/02/2019.  Antibiotics:   Anti-infectives (From admission, onward)   Start     Dose/Rate Route Frequency Ordered Stop   09/03/19 1400  piperacillin-tazobactam (ZOSYN) IVPB 3.375 g    Note to Pharmacy: For 7 days total   3.375 g 12.5 mL/hr over 240 Minutes Intravenous Every 8 hours 09/03/19 1120     09/02/19 1600  remdesivir 100 mg in sodium chloride 0.9 % 100 mL IVPB     100 mg 200 mL/hr over 30 Minutes Intravenous Daily 09/01/19 0119 09/06/19 0959   09/01/19 0130  remdesivir 200 mg in sodium chloride 0.9% 250 mL IVPB     200 mg 580 mL/hr over 30 Minutes Intravenous Once 09/01/19 0119 09/01/19 0441   09/01/19 0130  piperacillin-tazobactam (ZOSYN) IVPB 3.375 g  Status:  Discontinued     3.375 g 12.5 mL/hr over 240 Minutes Intravenous Every 8 hours 09/01/19 0119 09/03/19 1120   08/31/19 2315  Ampicillin-Sulbactam (UNASYN) 3 g in sodium chloride 0.9 % 100 mL IVPB     3 g 200 mL/hr over 30 Minutes Intravenous  Once 08/31/19 2258 09/01/19 0010   08/31/19 2300  aztreonam (AZACTAM) 2 g in sodium chloride 0.9 % 100 mL IVPB     2 g 200 mL/hr over 30 Minutes Intravenous  Once 08/31/19 2250 09/01/19 0010   08/31/19 2300  vancomycin (VANCOREADY) IVPB 2000 mg/400 mL     2,000 mg 200 mL/hr over 120 Minutes Intravenous  Once 08/31/19 2258 09/01/19 0230       Objective   Vitals:   09/04/19 0400 09/04/19 0432 09/04/19 0620 09/04/19 0800  BP: 128/80   129/80  Pulse: 64   66  Resp: (!) 21     Temp:  97.8 F (36.6 C) 98.6 F (37 C)   TempSrc:   Axillary   SpO2: 92%   91%  Weight:  90.4 kg    Height:        Intake/Output Summary (  Last 24 hours) at 09/04/2019 1350 Last data filed at 09/04/2019 1336 Gross per 24 hour  Intake 1177.69 ml  Output 2005 ml  Net -827.31 ml    12/28 1901 - 12/30  0700 In: 2340.2 [P.O.:120; I.V.:1590.4] Out: 0947 [Urine:4155]  Filed Weights   09/02/19 0411 09/03/19 0319 09/04/19 0432  Weight: 97.2 kg 95.4 kg 90.4 kg    Physical Examination:    General: Appears in no acute distress  Cardiovascular: S1-S2, regular, no murmur auscultated  Respiratory: Clear to auscultation bilaterally  Abdomen: Abdomen is soft, nontender, no organomegaly  Extremities: No edema in the lower extremities  Neurologic: Alert, no focal deficit noted    Data Reviewed:   Recent Results (from the past 240 hour(s))  Respiratory Panel by RT PCR (Flu A&B, Covid) - Nasopharyngeal Swab     Status: Abnormal   Collection Time: 08/31/19 10:48 PM   Specimen: Nasopharyngeal Swab  Result Value Ref Range Status   SARS Coronavirus 2 by RT PCR POSITIVE (A) NEGATIVE Final    Comment: RESULT CALLED TO, READ BACK BY AND VERIFIED WITH: RN ANDREW CAIN AT 0038 BY [place] ON 09/01/2019 (NOTE) SARS-CoV-2 target nucleic acids are DETECTED. SARS-CoV-2 RNA is generally detectable in upper respiratory specimens  during the acute phase of infection. Positive results are indicative of the presence of the identified virus, but do not rule out bacterial infection or co-infection with other pathogens not detected by the test. Clinical correlation with patient history and other diagnostic information is necessary to determine patient infection status. The expected result is Negative. Fact Sheet for Patients:  PinkCheek.be Fact Sheet for Healthcare Providers: GravelBags.it This test is not yet approved or cleared by the [place] FDA and  has been authorized for detection and/or diagnosis of SARS-CoV-2 by FDA under an Emergency Use Authorization (EUA).  This EUA will remain in effect (meaning th is test can be used) for the duration of  the COVID-19 declaration under Section 564(b)(1) of the Act, 21 U.S.C.  section 360bbb-3(b)(1), unless the authorization is terminated or revoked sooner.    Influenza A by PCR NEGATIVE NEGATIVE Final   Influenza B by PCR NEGATIVE NEGATIVE Final    Comment: (NOTE) The Xpert Xpress SARS-CoV-2/FLU/RSV assay is intended as an aid in  the diagnosis of influenza from Nasopharyngeal swab specimens and  should not be used as a sole basis for treatment. Nasal washings and  aspirates are unacceptable for Xpert Xpress SARS-CoV-2/FLU/RSV  testing. Fact Sheet for Patients: PinkCheek.be Fact Sheet for Healthcare Providers: GravelBags.it This test is not yet approved or cleared by the [place] FDA and  has been authorized for detection and/or diagnosis of SARS-CoV-2 by  FDA under an Emergency Use Authorization (EUA). This EUA will remain  in effect (meaning this test can be used) for the duration of the  Covid-19 declaration under Section 564(b)(1) of the Act, 21  U.S.C. section 360bbb-3(b)(1), unless the authorization is  terminated or revoked. Performed at [facility], [place] [address], [place], [place] [address]   Culture, blood (Routine x 2)     Status: None (Preliminary result)   Collection Time: 08/31/19 10:55 PM   Specimen: BLOOD LEFT WRIST  Result Value Ref Range Status   Specimen Description BLOOD LEFT WRIST  Final   Special Requests   Final    BOTTLES DRAWN AEROBIC AND ANAEROBIC Blood Culture adequate volume   Culture   Final    NO GROWTH 4 DAYS Performed at [facility], [place] [address], [place], [place] [address]    Report Status PENDING  Incomplete  Culture, blood (Routine x 2)     Status: None (Preliminary result)   Collection Time: 08/31/19 11:30 PM   Specimen: BLOOD  Result Value Ref Range Status   Specimen Description BLOOD RIGHT ANTECUBITAL  Final   Special Requests   Final    BOTTLES DRAWN AEROBIC AND ANAEROBIC Blood Culture adequate volume   Culture   Final    NO  GROWTH 4 DAYS Performed at [facility], [place] [address], [place], [place] [address]    Report Status PENDING  Incomplete  Urine culture     Status: None   Collection Time: 09/01/19 12:24 AM   Specimen: In/Out Cath Urine  Result Value Ref Range Status   Specimen Description IN/OUT CATH URINE  Final   Special Requests NONE  Final   Culture   Final    NO GROWTH Performed at [facility], [place] [address], [place], [place] [address]    Report Status 09/02/2019 FINAL  Final  Culture, respiratory (non-expectorated)     Status: None   Collection Time: 09/01/19  4:51 AM   Specimen: Tracheal Aspirate; Respiratory  Result Value Ref Range Status   Specimen Description TRACHEAL ASPIRATE  Final   Special Requests NONE  Final   Gram Stain   Final    RARE WBC PRESENT, PREDOMINANTLY PMN RARE GRAM POSITIVE COCCI IN PAIRS RARE BUDDING YEAST SEEN    Culture   Final    FEW Consistent with normal respiratory flora. Performed at [facility], [place] [address], [place], [place] [address]    Report Status 09/03/2019 FINAL  Final  MRSA PCR Screening     Status: None   Collection Time: 09/01/19  4:51 AM   Specimen: Nasopharyngeal  Result Value Ref Range Status   MRSA by PCR NEGATIVE NEGATIVE Final    Comment:        The GeneXpert MRSA Assay (FDA approved for NASAL specimens only), is one component of a comprehensive MRSA colonization surveillance program. It is not intended to diagnose MRSA infection nor to guide or monitor treatment for MRSA infections. Performed at [facility], [place] [address], [place], [place] [address]     No results for input(s): LIPASE, AMYLASE in the last 168 hours. No results for input(s): AMMONIA in the last 168 hours.  Cardiac Enzymes: No results for input(s): CKTOTAL, CKMB, CKMBINDEX, TROPONINI in the last 168 hours. BNP (last 3 results) Recent Labs    03/04/19 0455 08/31/19 2258  BNP 124.0* 91.7    ProBNP (last 3 results) No  results for input(s): PROBNP in the last 8760 hours.  Studies:  No results found.   Admission status: Inpatient: Based on patients clinical presentation and evaluation of above clinical data, I have made determination that patient meets Inpatient criteria at this time.   Oswald Hillock   [facility] If 7PM-7AM, please contact night-coverage at [email or web address], Office  [phone]  password House  09/04/2019, 1:50 PM  LOS: 3 days

## 2019-09-04 NOTE — NC FL2 (Addendum)
[place] LEVEL OF CARE SCREENING TOOL     IDENTIFICATION  Patient Name: John Blackburn Birthdate: [birth year] Sex: male Admission Date (Current Location): 08/31/2019  [facility] [place] Number:  Herbalist and Address:  [facility], [place] [address], [place], [place] [address]      Provider Number: [id number]  Attending Physician Name and Address:  John Hillock, MD  Relative Name and Phone Number:  John Blackburn, son, [phone]    Current Level of Care: Hospital Recommended Level of Care: [place] Prior Approval Number:    Date Approved/Denied:   PASRR Number: [id number]  Discharge Plan: SNF    Current Diagnoses: Patient Active Problem List   Diagnosis Date Noted  . Respiratory failure ([place]) 09/01/2019  . COVID-19 09/01/2019  . Weakness generalized 03/04/2019  . CAD (coronary artery disease) 03/04/2019  . Generalized weakness 03/04/2019  . Vascular dementia ([place]) 07/03/2018  . ARF (acute renal failure) ([place]) 04/23/2018  . Hypokalemia 04/23/2018  . Anorexia 04/23/2018  . Leucocytosis 04/23/2018  . EKG abnormalities 04/23/2018  . B12 deficiency 04/12/2018  . Memory loss 11/28/2017  . Balance problem 11/28/2017  . skin cancer   . Sleep apnea, primary central   . GERD (gastroesophageal reflux disease)   . MDD (major depressive disorder), single episode, moderate ([place])   . CAD S/P percutaneous coronary angioplasty 09/02/2017  . Chronic diastolic (congestive) heart failure ([place]) 08/30/2017  . Elevated blood sugar 01/17/2017  . Incontinence of feces   . Allergic rhinitis 10/15/2013  . Chronic venous insufficiency 07/20/2013  . History of pulmonary embolism 07/08/2013  . ED (erectile dysfunction) 06/01/2011  . Arthritis   . Hypertension   . Hyperlipidemia   . Obstructive sleep apnea     Orientation RESPIRATION BLADDER Height & Weight     Self  Normal Incontinent, External catheter Weight: 199 lb  4.7 oz (90.4 kg) Height:  '5\' 11"'  (180.3 cm)  BEHAVIORAL SYMPTOMS/MOOD NEUROLOGICAL BOWEL NUTRITION STATUS      Incontinent Diet(Please see DC Summary)  AMBULATORY STATUS COMMUNICATION OF NEEDS Skin   Extensive Assist Verbally Normal                       Personal Care Assistance Level of Assistance  Bathing, Feeding, Dressing Bathing Assistance: Limited assistance Feeding assistance: Independent Dressing Assistance: Limited assistance     Functional Limitations Info  Sight, Hearing, Speech Sight Info: Adequate Hearing Info: Adequate Speech Info: Adequate    SPECIAL CARE FACTORS FREQUENCY  PT (By licensed PT), OT (By licensed OT)     PT Frequency: 3x OT Frequency: 2x            Contractures Contractures Info: Not present    Additional Factors Info  Code Status, Allergies, Insulin Sliding Scale, Isolation Precautions Code Status Info: DNR Allergies Info: Cephalexin, Levofloxacin   Insulin Sliding Scale Info: See DC Summary for dose Isolation Precautions Info: COVID +     Current Medications (09/04/2019):  This is the current hospital active medication list Current Facility-Administered Medications  Medication Dose Route Frequency Provider Last Rate Last Admin  . ascorbic acid (VITAMIN C) 500 MG/5ML syrup 250 mg  250 mg Per Tube Daily Audria Nine, DO   250 mg at 09/04/19 0955  . aspirin chewable tablet 81 mg  81 mg Per Tube Daily Audria Nine, DO   81 mg at 09/04/19 0955  . atorvastatin (LIPITOR) tablet 40 mg  40 mg  Per Tube q1800 Audria Nine, DO   40 mg at 09/03/19 1819  . chlorhexidine (PERIDEX) 0.12 % solution 15 mL  15 mL Mouth Rinse BID Audria Nine, DO   15 mL at 09/04/19 1007  . chlorhexidine gluconate (MEDLINE KIT) (PERIDEX) 0.12 % solution 15 mL  15 mL Mouth Rinse BID Audria Nine, DO   15 mL at 09/04/19 0951  . Chlorhexidine Gluconate Cloth 2 % PADS 6 each  6 each Topical Daily Audria Nine, DO   6 each at 09/04/19 1242   . cholecalciferol (VITAMIN D3) tablet 1,000 Units  1,000 Units Per Tube Daily Audria Nine, DO   1,000 Units at 09/04/19 0955  . clopidogrel (PLAVIX) tablet 75 mg  75 mg Per Tube Daily Audria Nine, DO   75 mg at 09/04/19 0955  . dexamethasone (DECADRON) injection 6 mg  6 mg Intravenous Q0600 Audria Nine, DO   6 mg at 09/04/19 1638  . escitalopram (LEXAPRO) tablet 10 mg  10 mg Per Tube Daily Audria Nine, DO   10 mg at 09/04/19 0954  . heparin injection 5,000 Units  5,000 Units Subcutaneous Q8H Audria Nine, DO   5,000 Units at 09/04/19 1401  . insulin aspart (novoLOG) injection 0-9 Units  0-9 Units Subcutaneous Q4H Audria Nine, DO   3 Units at 09/04/19 1242  . lactated ringers infusion   Intravenous Continuous Audria Nine, DO 50 mL/hr at 09/04/19 0655 New Bag at 09/04/19 0655  . MEDLINE mouth rinse  15 mL Mouth Rinse q12n4p Audria Nine, DO   15 mL at 09/04/19 1242  . pantoprazole sodium (PROTONIX) 40 mg/20 mL oral suspension 40 mg  40 mg Per Tube Daily Audria Nine, DO   40 mg at 09/04/19 0955  . piperacillin-tazobactam (ZOSYN) IVPB 3.375 g  3.375 g Intravenous Q8H Marshall, Jessica, DO 12.5 mL/hr at 09/04/19 1401 3.375 g at 09/04/19 1401  . potassium chloride 20 MEQ/15ML (10%) solution 20 mEq  20 mEq Per Tube Once [place], Yujing Z, RPH      . remdesivir 100 mg in sodium chloride 0.9 % 100 mL IVPB  100 mg Intravenous Daily Audria Nine, DO 200 mL/hr at 09/04/19 0954 100 mg at 09/04/19 0954  . Resource ThickenUp Clear   Oral PRN Audria Nine, DO      . zinc sulfate capsule 220 mg  220 mg Per Tube Daily Audria Nine, DO   220 mg at 09/04/19 4665     Discharge Medications: Please see discharge summary for a list of discharge medications.  Relevant Imaging Results:  Relevant Lab Results:   Additional Information SS#: [id number]  John Halsted, LCSW

## 2019-09-04 NOTE — Progress Notes (Signed)
  Speech Language Pathology Treatment: Dysphagia  Patient Details Name: John Blackburn MRN: [id number] DOB: 1946-01-28 Today's Date: 09/04/2019 Time: [id number] SLP Time Calculation (min) (ACUTE ONLY): 11 min  Assessment / Plan / Recommendation Clinical Impression  Pt self-fed purees and nectar thick liquids with appropriate pacing, but does not like to alternate bites/sips, which would likely be of benefit given prior MBS. After consuming four ounces of each consistency he exhibited a delayed cough, which could have been related to mismanagement of residue and/or esophageal component. Either way, he seemed to be alleviated with a single cue from SLP to use a liquid wash. I would rather upgrade him more gradually given his hx, concern for aspiration PNA, and cognitive status. Will advance to Dys 1 diet with nectar thick liquids - still similar textures but with more substance for his nutrition. SLP will continue to follow.    HPI HPI: Pt is a 73 yo male admitted from SNF after an episode of nausea/vomiting and dyspnea. He was intubated in the ED (12/26-12/28) given AMS and hypoxia with concern for aspiration PNA. He tested postitive for COVID-19. PMH: HTN, HLD, GERD, depression, CAD s/p stent, PE, OSA, dCHF, memory loss. Pt was evaluated for swallowing earlier this year with MBS in July showing residue in the valleculae (increasing with more solid textures); minimal residue at the cervical esophagus; flash penetration of thin liquids; hesitancy of pill in the esophagus. Dys 3 diet and thin liquids, meds whole in puree recommended at that time.      SLP Plan  Continue with current plan of care       Recommendations  Diet recommendations: Dysphagia 1 (puree);Nectar-thick liquid Medication Administration: Crushed with puree Supervision: Patient able to self feed;Full supervision/cueing for compensatory strategies Compensations: Minimize environmental distractions;Slow rate;Small  sips/bites;Follow solids with liquid Postural Changes and/or Swallow Maneuvers: Seated upright 90 degrees;Upright 30-60 min after meal                Oral Care Recommendations: Oral care BID Follow up Recommendations: Skilled Nursing facility SLP Visit Diagnosis: Dysphagia, pharyngoesophageal phase (R13.14) Plan: Continue with current plan of care       GO                 Osie Bond., M.A. [facility] Pager [phone] Office [phone]  09/04/2019, 3:50 PM

## 2019-09-05 LAB — CBC
HCT: 40.9 % (ref 39.0–52.0)
Hemoglobin: 13.9 g/dL (ref 13.0–17.0)
MCH: 31.6 pg (ref 26.0–34.0)
MCHC: 34 g/dL (ref 30.0–36.0)
MCV: 93 fL (ref 80.0–100.0)
Platelets: 187 10*3/uL (ref 150–400)
RBC: 4.4 MIL/uL (ref 4.22–5.81)
RDW: 14.5 % (ref 11.5–15.5)
WBC: 9.7 10*3/uL (ref 4.0–10.5)
nRBC: 0 % (ref 0.0–0.2)

## 2019-09-05 LAB — GLUCOSE, CAPILLARY
Glucose-Capillary: 185 mg/dL — ABNORMAL HIGH (ref 70–99)
Glucose-Capillary: 134 mg/dL — ABNORMAL HIGH (ref 70–99)
Glucose-Capillary: 261 mg/dL — ABNORMAL HIGH (ref 70–99)
Glucose-Capillary: 167 mg/dL — ABNORMAL HIGH (ref 70–99)
Glucose-Capillary: 157 mg/dL — ABNORMAL HIGH (ref 70–99)

## 2019-09-05 LAB — CULTURE, BLOOD (ROUTINE X 2)
Culture: NO GROWTH
Culture: NO GROWTH
Special Requests: ADEQUATE
Special Requests: ADEQUATE

## 2019-09-05 NOTE — Progress Notes (Signed)
CSW spoke with [facility] admissions. Once patient is medically stable, they are requesting we send patient to [facility] in [place] (their sister facility that is accepting COVID patients). [facility] will provide transportation as it is over 50 miles and [facility] will not be able to transport without up front payment.   Percell Locus Lundynn Cohoon LCSW [phone]

## 2019-09-05 NOTE — Progress Notes (Signed)
[facility]  PROGRESS NOTE  John Blackburn PZW:[id number] DOB: 1946-04-27 DOA: 08/31/2019 PCP: Hoyt Koch, MD   Brief HPI:   73 year old male with a history of hypertension, hyperlipidemia, GERD, depression, CAD s/p stent placement, PE not on anticoagulation, diastolic CHF, OSA, memory loss was brought to ED after he had episode of nausea vomiting and dyspnea. Patient developed altered mental status and hypoxia in the ED required intubation, was transferred to ICU. He was found to be COVID-19 positive. Patient was extubated on 09/02/2019. [facility] assumed care on 09/04/2019.    Subjective   Patient seen and examined, denies chest pain or shortness of breath.   Assessment/Plan:     1. Acute hypoxic respiratory failure-secondary to aspiration pneumonia and Covid pneumonia. Patient is currently on remdesivir, stop after 09/06/2019.He is also on Decadron. Continue vitamin C, zinc. He is also on IV Zosyn. Last CRP was 2.5 on 08/31/2019.  Patient O2 sats currently is 93% on room air., CRP 8.1, ferritin 362.  We will continue with current management.   2. Metabolic encephalopathy-secondary to hypoxic respiratory failure, he has memory loss. He slowly improving, patient has baseline dementia. Continue Namenda.  3. History of CAD s/p stent placement, chronic diastolic CHF-continue aspirin, Plavix, Lipitor.  4. Steroid-induced hyperglycemia-continue sliding scale insulin with NovoLog.  5. Hypokalemia-potassium was 3.4, will replace potassium and follow BMP in a.m.        COVID-19 Labs   Lab Results  Component Value Date   SARSCOV2NAA POSITIVE (A) 08/31/2019   [place] NEGATIVE 03/21/2019   SARSCOV2NAA NOT DETECTED 03/11/2019     CBG: Recent Labs  Lab 09/04/19 2000 09/04/19 2341 09/05/19 0405 09/05/19 0801 09/05/19 1143  GLUCAP 211* 95 185* 134* 261*    CBC: Recent Labs  Lab 08/31/19 2242 09/01/19 0500 09/02/19 0500 09/03/19 0500 09/04/19 0500  09/05/19 0619  WBC 3.9* 11.3* 20.4* 19.1* 14.5* 9.7  NEUTROABS 2.7  --   --   --   --   --   HGB 16.5 16.1 14.7 15.5 15.4 13.9  HCT 50.2 46.9 44.0 45.6 44.7 40.9  MCV 96.0 93.8 95.4 93.6 92.9 93.0  PLT 197 189 178 195 197 423    Basic Metabolic Panel: Recent Labs  Lab 08/31/19 2242 09/01/19 0008 09/01/19 0211 09/01/19 0500 09/01/19 1200 09/01/19 1700 09/02/19 0500 09/02/19 1637 09/03/19 0500 09/04/19 0500  NA 139 140  --  140  --   --  139  --  141 141  K 3.4* 3.0*  --  3.1*  --   --  4.0  --  3.0* 3.4*  CL 103  --   --  108  --   --  102  --  100 102  CO2 25  --   --  20*  --   --  24  --  28 27  GLUCOSE 245*  --   --  206*  --   --  170*  --  92 110*  BUN 15  --   --  19  --   --  33*  --  17 15  CREATININE 1.08  --  0.87 0.95  --   --  1.18  --  0.69 0.88  CALCIUM 8.5*  --   --  8.4*  --   --  8.9  --  8.6* 8.7*  MG  --   --   --  2.0 2.2 2.1 2.2 2.2  --   --   PHOS  --   --   --  4.6 4.8* 3.8 3.6 2.1*  --   --      Liver Function Tests: Recent Labs  Lab 08/31/19 2242 09/02/19 0500 09/03/19 0500 09/04/19 0500  AST 45* 29 37 35  ALT 36 29 31 34  ALKPHOS 94 60 74 73  BILITOT 0.4 0.6 0.8 0.8  PROT 6.9 6.7 7.0 6.6  ALBUMIN 3.2* 2.8* 3.0* 2.9*        DVT prophylaxis: Heparin  Code Status: DNR  Family Communication: No family at bedside  Disposition Plan: likely home when medically ready for discharge         Scheduled medications:  . ascorbic acid  250 mg Per Tube Daily  . aspirin  81 mg Per Tube Daily  . atorvastatin  40 mg Per Tube q1800  . chlorhexidine  15 mL Mouth Rinse BID  . chlorhexidine gluconate (MEDLINE KIT)  15 mL Mouth Rinse BID  . Chlorhexidine Gluconate Cloth  6 each Topical Daily  . cholecalciferol  1,000 Units Per Tube Daily  . clopidogrel  75 mg Per Tube Daily  . dexamethasone (DECADRON) injection  6 mg Intravenous Q0600  . escitalopram  10 mg Per Tube Daily  . heparin  5,000 Units Subcutaneous Q8H  . insulin aspart   0-9 Units Subcutaneous Q4H  . mouth rinse  15 mL Mouth Rinse q12n4p  . pantoprazole sodium  40 mg Per Tube Daily  . zinc sulfate  220 mg Per Tube Daily    Consultants:  PCCM  Procedures:  ETT from 08/31/2019 to 09/02/2019.  Antibiotics:   Anti-infectives (From admission, onward)   Start     Dose/Rate Route Frequency Ordered Stop   09/03/19 1400  piperacillin-tazobactam (ZOSYN) IVPB 3.375 g    Note to Pharmacy: For 7 days total   3.375 g 12.5 mL/hr over 240 Minutes Intravenous Every 8 hours 09/03/19 1120     09/02/19 1600  remdesivir 100 mg in sodium chloride 0.9 % 100 mL IVPB     100 mg 200 mL/hr over 30 Minutes Intravenous Daily 09/01/19 0119 09/05/19 0954   09/01/19 0130  remdesivir 200 mg in sodium chloride 0.9% 250 mL IVPB     200 mg 580 mL/hr over 30 Minutes Intravenous Once 09/01/19 0119 09/01/19 0441   09/01/19 0130  piperacillin-tazobactam (ZOSYN) IVPB 3.375 g  Status:  Discontinued     3.375 g 12.5 mL/hr over 240 Minutes Intravenous Every 8 hours 09/01/19 0119 09/03/19 1120   08/31/19 2315  Ampicillin-Sulbactam (UNASYN) 3 g in sodium chloride 0.9 % 100 mL IVPB     3 g 200 mL/hr over 30 Minutes Intravenous  Once 08/31/19 2258 09/01/19 0010   08/31/19 2300  aztreonam (AZACTAM) 2 g in sodium chloride 0.9 % 100 mL IVPB     2 g 200 mL/hr over 30 Minutes Intravenous  Once 08/31/19 2250 09/01/19 0010   08/31/19 2300  vancomycin (VANCOREADY) IVPB 2000 mg/400 mL     2,000 mg 200 mL/hr over 120 Minutes Intravenous  Once 08/31/19 2258 09/01/19 0230       Objective   Vitals:   09/04/19 1419 09/04/19 2010 09/05/19 0451 09/05/19 0800  BP: 122/75 130/74 (!) 141/79 (!) 142/77  Pulse: 67 66 (!) 59 (!) 52  Resp: 19 (!) 23 (!) 21 20  Temp: 97.9 F (36.6 C) 97.9 F (36.6 C) 97.8 F (36.6 C) (!) 96.7 F (35.9 C)  TempSrc: Oral Oral Oral Axillary  SpO2: 94% 91% 92% 93%  Weight:   94.9 kg  Height:        Intake/Output Summary (Last 24 hours) at 09/05/2019 1440 Last  data filed at 09/05/2019 1203 Gross per 24 hour  Intake 840 ml  Output 1000 ml  Net -160 ml    12/29 1901 - 12/31 0700 In: 440.6 [I.V.:391.6] Out: 2205 [Urine:2205]  Filed Weights   09/03/19 0319 09/04/19 0432 09/05/19 0451  Weight: 95.4 kg 90.4 kg 94.9 kg    Physical Examination:    General-appears in no acute distress  Heart-S1-S2, regular, no murmur auscultated  Lungs-clear to auscultation bilaterally, no wheezing or crackles auscultated  Abdomen-soft, nontender, no organomegaly  Extremities-no edema in the lower extremities  Neuro-alert, oriented x3, no focal deficit noted    Data Reviewed:   Recent Results (from the past 240 hour(s))  Respiratory Panel by RT PCR (Flu A&B, Covid) - Nasopharyngeal Swab     Status: Abnormal   Collection Time: 08/31/19 10:48 PM   Specimen: Nasopharyngeal Swab  Result Value Ref Range Status   SARS Coronavirus 2 by RT PCR POSITIVE (A) NEGATIVE Final    Comment: RESULT CALLED TO, READ BACK BY AND VERIFIED WITH: RN ANDREW CAIN AT 0038 BY [place] ON 09/01/2019 (NOTE) SARS-CoV-2 target nucleic acids are DETECTED. SARS-CoV-2 RNA is generally detectable in upper respiratory specimens  during the acute phase of infection. Positive results are indicative of the presence of the identified virus, but do not rule out bacterial infection or co-infection with other pathogens not detected by the test. Clinical correlation with patient history and other diagnostic information is necessary to determine patient infection status. The expected result is Negative. Fact Sheet for Patients:  PinkCheek.be Fact Sheet for Healthcare Providers: GravelBags.it This test is not yet approved or cleared by the [place] FDA and  has been authorized for detection and/or diagnosis of SARS-CoV-2 by FDA under an Emergency Use Authorization (EUA).  This EUA will remain in effect (meaning th  is test can be used) for the duration of  the COVID-19 declaration under Section 564(b)(1) of the Act, 21 U.S.C. section 360bbb-3(b)(1), unless the authorization is terminated or revoked sooner.    Influenza A by PCR NEGATIVE NEGATIVE Final   Influenza B by PCR NEGATIVE NEGATIVE Final    Comment: (NOTE) The Xpert Xpress SARS-CoV-2/FLU/RSV assay is intended as an aid in  the diagnosis of influenza from Nasopharyngeal swab specimens and  should not be used as a sole basis for treatment. Nasal washings and  aspirates are unacceptable for Xpert Xpress SARS-CoV-2/FLU/RSV  testing. Fact Sheet for Patients: PinkCheek.be Fact Sheet for Healthcare Providers: GravelBags.it This test is not yet approved or cleared by the [place] FDA and  has been authorized for detection and/or diagnosis of SARS-CoV-2 by  FDA under an Emergency Use Authorization (EUA). This EUA will remain  in effect (meaning this test can be used) for the duration of the  Covid-19 declaration under Section 564(b)(1) of the Act, 21  U.S.C. section 360bbb-3(b)(1), unless the authorization is  terminated or revoked. Performed at [facility], [place] [address] [place], [place] [address]   Culture, blood (Routine x 2)     Status: None   Collection Time: 08/31/19 10:55 PM   Specimen: BLOOD LEFT WRIST  Result Value Ref Range Status   Specimen Description BLOOD LEFT WRIST  Final   Special Requests   Final    BOTTLES DRAWN AEROBIC AND ANAEROBIC Blood Culture adequate volume   Culture   Final    NO GROWTH  5 DAYS Performed at [facility], [place] [address] [place], [place] [address]    Report Status 09/05/2019 FINAL  Final  Culture, blood (Routine x 2)     Status: None   Collection Time: 08/31/19 11:30 PM   Specimen: BLOOD  Result Value Ref Range Status   Specimen Description BLOOD RIGHT ANTECUBITAL  Final   Special Requests   Final    BOTTLES DRAWN  AEROBIC AND ANAEROBIC Blood Culture adequate volume   Culture   Final    NO GROWTH 5 DAYS Performed at [facility], [place] [address], [place], [place] [address]    Report Status 09/05/2019 FINAL  Final  Urine culture     Status: None   Collection Time: 09/01/19 12:24 AM   Specimen: In/Out Cath Urine  Result Value Ref Range Status   Specimen Description IN/OUT CATH URINE  Final   Special Requests NONE  Final   Culture   Final    NO GROWTH Performed at [facility], [place] [address], [place], [place] [address]    Report Status 09/02/2019 FINAL  Final  Culture, respiratory (non-expectorated)     Status: None   Collection Time: 09/01/19  4:51 AM   Specimen: Tracheal Aspirate; Respiratory  Result Value Ref Range Status   Specimen Description TRACHEAL ASPIRATE  Final   Special Requests NONE  Final   Gram Stain   Final    RARE WBC PRESENT, PREDOMINANTLY PMN RARE GRAM POSITIVE COCCI IN PAIRS RARE BUDDING YEAST SEEN    Culture   Final    FEW Consistent with normal respiratory flora. Performed at [facility], [place] [address], [place], [place] [address]    Report Status 09/03/2019 FINAL  Final  MRSA PCR Screening     Status: None   Collection Time: 09/01/19  4:51 AM   Specimen: Nasopharyngeal  Result Value Ref Range Status   MRSA by PCR NEGATIVE NEGATIVE Final    Comment:        The GeneXpert MRSA Assay (FDA approved for NASAL specimens only), is one component of a comprehensive MRSA colonization surveillance program. It is not intended to diagnose MRSA infection nor to guide or monitor treatment for MRSA infections. Performed at [facility], [place] [address], [place], [place] [address]     No results for input(s): LIPASE, AMYLASE in the last 168 hours. No results for input(s): AMMONIA in the last 168 hours.  Cardiac Enzymes: No results for input(s): CKTOTAL, CKMB, CKMBINDEX, TROPONINI in the last 168 hours. BNP (last 3 results) Recent Labs     03/04/19 0455 08/31/19 2258  BNP 124.0* 91.7       Admission status: Inpatient: Based on patients clinical presentation and evaluation of above clinical data, I have made determination that patient meets Inpatient criteria at this time.   Oswald Hillock   [facility] If 7PM-7AM, please contact night-coverage at [email or web address], Office  [phone]  password [place]  09/05/2019, 2:40 PM  LOS: 4 days

## 2019-09-06 LAB — GLUCOSE, CAPILLARY
Glucose-Capillary: 177 mg/dL — ABNORMAL HIGH (ref 70–99)
Glucose-Capillary: 180 mg/dL — ABNORMAL HIGH (ref 70–99)
Glucose-Capillary: 190 mg/dL — ABNORMAL HIGH (ref 70–99)
Glucose-Capillary: 193 mg/dL — ABNORMAL HIGH (ref 70–99)
Glucose-Capillary: 207 mg/dL — ABNORMAL HIGH (ref 70–99)
Glucose-Capillary: 212 mg/dL — ABNORMAL HIGH (ref 70–99)

## 2019-09-06 LAB — BASIC METABOLIC PANEL
Anion gap: 10 (ref 5–15)
BUN: 26 mg/dL — ABNORMAL HIGH (ref 8–23)
CO2: 26 mmol/L (ref 22–32)
Calcium: 8.5 mg/dL — ABNORMAL LOW (ref 8.9–10.3)
Chloride: 106 mmol/L (ref 98–111)
Creatinine, Ser: 0.77 mg/dL (ref 0.61–1.24)
GFR calc Af Amer: >60 mL/min (ref >60)
GFR calc non Af Amer: >60 mL/min (ref >60)
Glucose, Bld: 212 mg/dL — ABNORMAL HIGH (ref 70–99)
Potassium: 2.9 mmol/L — ABNORMAL LOW (ref 3.5–5.1)
Sodium: 142 mmol/L (ref 135–145)

## 2019-09-06 LAB — C-REACTIVE PROTEIN: CRP: 3.5 mg/dL — ABNORMAL HIGH (ref <1.0)

## 2019-09-06 LAB — D-DIMER, QUANTITATIVE: D-Dimer, Quant: 1.16 ug/mL-FEU — ABNORMAL HIGH (ref 0.00–0.50)

## 2019-09-06 MED ORDER — GUAIFENESIN ER 600 MG PO TB12
600.0000 mg | ORAL_TABLET | Freq: Two times a day (BID) | ORAL | Status: DC
  Administered 2019-09-06 – 2019-09-09 (×6): 600 mg via ORAL
  Filled 2019-09-06 (×7): qty 1

## 2019-09-06 MED ORDER — POTASSIUM CHLORIDE 10 MEQ/100ML IV SOLN
10.0000 meq | INTRAVENOUS | Status: AC
  Administered 2019-09-06 (×2): 10 meq via INTRAVENOUS
  Filled 2019-09-06: qty 100

## 2019-09-06 NOTE — TOC Progression Note (Addendum)
Transition of Care [facility]) - Progression Note    Patient Details  Name: John Blackburn MRN: [id number] Date of Birth: [birth year]  Transition of Care [facility]) CM/SW [place], [place] Phone Number: 09/06/2019, 9:57 AM  Clinical Narrative:     Update: Patient will be medically ready on Saturday. CSW called Chantelle with [employer]. She will call the facility and inform the CSW of discharge plan.   Domenic Schwab, MSW, LCSW-A Clinical Social Worker [facility]  [phone]   Patient will discharge to [facility] in [place]. Patient is from [place] but will go to their sister facility to receive SNF and COVID-19 treatment.   CSW messaged MD about projected discharge date. Facility will need 48-72 hours of notice so that they can provide transportation.   CSW will continue to follow and assist with discharge planning.   Expected Discharge Plan: [place] Barriers to Discharge: Continued Medical Work up, Ship broker  Expected Discharge Plan and Services Expected Discharge Plan: [place] Choice: [place] arrangements for the past 2 months: [place]                                       Social Determinants of Health (SDOH) Interventions    Readmission Risk Interventions No flowsheet data found.

## 2019-09-06 NOTE — Plan of Care (Signed)

## 2019-09-06 NOTE — Progress Notes (Signed)
[facility]  PROGRESS NOTE  John Blackburn ZJI:[id number] DOB: 17-Jul-1946 DOA: 08/31/2019 PCP: Hoyt Koch, MD   Brief HPI:   74 year old male with a history of hypertension, hyperlipidemia, GERD, depression, CAD s/p stent placement, PE not on anticoagulation, diastolic CHF, OSA, memory loss was brought to ED after he had episode of nausea vomiting and dyspnea. Patient developed altered mental status and hypoxia in the ED required intubation, was transferred to ICU. He was found to be COVID-19 positive. Patient was extubated on 09/02/2019. [facility] assumed care on 09/04/2019.    Subjective   Patient seen and examined, denies chest pain or shortness of breath.   Assessment/Plan:     1. Acute hypoxic respiratory failure-secondary to aspiration pneumonia and Covid pneumonia. Patient is currently on remdesivir, stop after 09/06/2019.He is also on Decadron. Continue vitamin C, zinc. He is also on IV Zosyn. Last CRP was 2.5 on 08/31/2019.  Patient O2 sats currently is 93% on room air., CRP 8.1, ferritin 362.  We will continue with current management.  Repeat inflammatory markers show CRP 3.5.  D-dimer 1.16.  2. Hypokalemia-potassium is 2.9, will replace potassium and follow BMP in a.m.  3. Metabolic encephalopathy-secondary to hypoxic respiratory failure, he has memory loss. He slowly improving, patient has baseline dementia. Continue Namenda.  4. History of CAD s/p stent placement, chronic diastolic CHF-continue aspirin, Plavix, Lipitor.  5. Steroid-induced hyperglycemia-continue sliding scale insulin with NovoLog.  6. Hypokalemia-potassium was 3.4, will replace potassium and follow BMP in a.m.        COVID-19 Labs   Lab Results  Component Value Date   SARSCOV2NAA POSITIVE (A) 08/31/2019   [place] NEGATIVE 03/21/2019   SARSCOV2NAA NOT DETECTED 03/11/2019     CBG: Recent Labs  Lab 09/05/19 2006 09/06/19 0025 09/06/19 0407 09/06/19 0904 09/06/19 1319   GLUCAP 157* 212* 193* 180* 207*    CBC: Recent Labs  Lab 08/31/19 2242 09/01/19 0500 09/02/19 0500 09/03/19 0500 09/04/19 0500 09/05/19 0619  WBC 3.9* 11.3* 20.4* 19.1* 14.5* 9.7  NEUTROABS 2.7  --   --   --   --   --   HGB 16.5 16.1 14.7 15.5 15.4 13.9  HCT 50.2 46.9 44.0 45.6 44.7 40.9  MCV 96.0 93.8 95.4 93.6 92.9 93.0  PLT 197 189 178 195 197 175    Basic Metabolic Panel: Recent Labs  Lab 09/01/19 0500 09/01/19 1200 09/01/19 1700 09/02/19 0500 09/02/19 1637 09/03/19 0500 09/04/19 0500 09/06/19 0310  NA 140  --   --  139  --  141 141 142  K 3.1*  --   --  4.0  --  3.0* 3.4* 2.9*  CL 108  --   --  102  --  100 102 106  CO2 20*  --   --  24  --  '28 27 26  ' GLUCOSE 206*  --   --  170*  --  92 110* 212*  BUN 19  --   --  33*  --  17 15 26*  CREATININE 0.95  --   --  1.18  --  0.69 0.88 0.77  CALCIUM 8.4*  --   --  8.9  --  8.6* 8.7* 8.5*  MG 2.0 2.2 2.1 2.2 2.2  --   --   --   PHOS 4.6 4.8* 3.8 3.6 2.1*  --   --   --      Liver Function Tests: Recent Labs  Lab 08/31/19 2242 09/02/19 0500 09/03/19 0500  09/04/19 0500  AST 45* 29 37 35  ALT 36 29 31 34  ALKPHOS 94 60 74 73  BILITOT 0.4 0.6 0.8 0.8  PROT 6.9 6.7 7.0 6.6  ALBUMIN 3.2* 2.8* 3.0* 2.9*        DVT prophylaxis: Heparin  Code Status: DNR  Family Communication: No family at bedside  Disposition Plan: likely home when medically ready for discharge         Scheduled medications:  . ascorbic acid  250 mg Per Tube Daily  . aspirin  81 mg Per Tube Daily  . atorvastatin  40 mg Per Tube q1800  . chlorhexidine  15 mL Mouth Rinse BID  . chlorhexidine gluconate (MEDLINE KIT)  15 mL Mouth Rinse BID  . Chlorhexidine Gluconate Cloth  6 each Topical Daily  . cholecalciferol  1,000 Units Per Tube Daily  . clopidogrel  75 mg Per Tube Daily  . dexamethasone (DECADRON) injection  6 mg Intravenous Q0600  . escitalopram  10 mg Per Tube Daily  . guaiFENesin  600 mg Oral BID  . heparin  5,000  Units Subcutaneous Q8H  . insulin aspart  0-9 Units Subcutaneous Q4H  . mouth rinse  15 mL Mouth Rinse q12n4p  . pantoprazole sodium  40 mg Per Tube Daily  . zinc sulfate  220 mg Per Tube Daily    Consultants:  PCCM  Procedures:  ETT from 08/31/2019 to 09/02/2019.  Antibiotics:   Anti-infectives (From admission, onward)   Start     Dose/Rate Route Frequency Ordered Stop   09/03/19 1400  piperacillin-tazobactam (ZOSYN) IVPB 3.375 g    Note to Pharmacy: For 7 days total   3.375 g 12.5 mL/hr over 240 Minutes Intravenous Every 8 hours 09/03/19 1120 09/06/19 1840   09/02/19 1600  remdesivir 100 mg in sodium chloride 0.9 % 100 mL IVPB     100 mg 200 mL/hr over 30 Minutes Intravenous Daily 09/01/19 0119 09/05/19 1030   09/01/19 0130  remdesivir 200 mg in sodium chloride 0.9% 250 mL IVPB     200 mg 580 mL/hr over 30 Minutes Intravenous Once 09/01/19 0119 09/01/19 0441   09/01/19 0130  piperacillin-tazobactam (ZOSYN) IVPB 3.375 g  Status:  Discontinued     3.375 g 12.5 mL/hr over 240 Minutes Intravenous Every 8 hours 09/01/19 0119 09/03/19 1120   08/31/19 2315  Ampicillin-Sulbactam (UNASYN) 3 g in sodium chloride 0.9 % 100 mL IVPB     3 g 200 mL/hr over 30 Minutes Intravenous  Once 08/31/19 2258 09/01/19 0010   08/31/19 2300  aztreonam (AZACTAM) 2 g in sodium chloride 0.9 % 100 mL IVPB     2 g 200 mL/hr over 30 Minutes Intravenous  Once 08/31/19 2250 09/01/19 0010   08/31/19 2300  vancomycin (VANCOREADY) IVPB 2000 mg/400 mL     2,000 mg 200 mL/hr over 120 Minutes Intravenous  Once 08/31/19 2258 09/01/19 0230       Objective   Vitals:   09/06/19 0900 09/06/19 1000 09/06/19 1100 09/06/19 1358  BP: 122/85   139/75  Pulse: 71   65  Resp: 18 (!) 26 (!) 24 (!) 24  Temp: 97.6 F (36.4 C)   98.3 F (36.8 C)  TempSrc: Oral   Axillary  SpO2: 94%   96%  Weight:      Height:        Intake/Output Summary (Last 24 hours) at 09/06/2019 1535 Last data filed at 09/06/2019 1450 Gross  per 24 hour  Intake  480 ml  Output 1550 ml  Net -1070 ml    12/30 1901 - 01/01 0700 In: 1800 [P.O.:1800] Out: 1400 [Urine:1400]  Filed Weights   09/04/19 0432 09/05/19 0451 09/06/19 0410  Weight: 90.4 kg 94.9 kg 97.4 kg    Physical Examination:    General-appears in no acute distress  Heart-S1-S2, regular, no murmur auscultated  Lungs-clear to auscultation bilaterally, no wheezing or crackles auscultated  Abdomen-soft, nontender, no organomegaly  Extremities-no edema in the lower extremities  Neuro-alert, oriented x3, no focal deficit noted    Data Reviewed:   Recent Results (from the past 240 hour(s))  Respiratory Panel by RT PCR (Flu A&B, Covid) - Nasopharyngeal Swab     Status: Abnormal   Collection Time: 08/31/19 10:48 PM   Specimen: Nasopharyngeal Swab  Result Value Ref Range Status   SARS Coronavirus 2 by RT PCR POSITIVE (A) NEGATIVE Final    Comment: RESULT CALLED TO, READ BACK BY AND VERIFIED WITH: RN ANDREW CAIN AT 0038 BY [place] ON 09/01/2019 (NOTE) SARS-CoV-2 target nucleic acids are DETECTED. SARS-CoV-2 RNA is generally detectable in upper respiratory specimens  during the acute phase of infection. Positive results are indicative of the presence of the identified virus, but do not rule out bacterial infection or co-infection with other pathogens not detected by the test. Clinical correlation with patient history and other diagnostic information is necessary to determine patient infection status. The expected result is Negative. Fact Sheet for Patients:  PinkCheek.be Fact Sheet for Healthcare Providers: GravelBags.it This test is not yet approved or cleared by the [place] FDA and  has been authorized for detection and/or diagnosis of SARS-CoV-2 by FDA under an Emergency Use Authorization (EUA).  This EUA will remain in effect (meaning th is test can be used) for the duration  of  the COVID-19 declaration under Section 564(b)(1) of the Act, 21 U.S.C. section 360bbb-3(b)(1), unless the authorization is terminated or revoked sooner.    Influenza A by PCR NEGATIVE NEGATIVE Final   Influenza B by PCR NEGATIVE NEGATIVE Final    Comment: (NOTE) The Xpert Xpress SARS-CoV-2/FLU/RSV assay is intended as an aid in  the diagnosis of influenza from Nasopharyngeal swab specimens and  should not be used as a sole basis for treatment. Nasal washings and  aspirates are unacceptable for Xpert Xpress SARS-CoV-2/FLU/RSV  testing. Fact Sheet for Patients: PinkCheek.be Fact Sheet for Healthcare Providers: GravelBags.it This test is not yet approved or cleared by the [place] FDA and  has been authorized for detection and/or diagnosis of SARS-CoV-2 by  FDA under an Emergency Use Authorization (EUA). This EUA will remain  in effect (meaning this test can be used) for the duration of the  Covid-19 declaration under Section 564(b)(1) of the Act, 21  U.S.C. section 360bbb-3(b)(1), unless the authorization is  terminated or revoked. Performed at [facility], [place] [address], [place], [place] [address]   Culture, blood (Routine x 2)     Status: None   Collection Time: 08/31/19 10:55 PM   Specimen: BLOOD LEFT WRIST  Result Value Ref Range Status   Specimen Description BLOOD LEFT WRIST  Final   Special Requests   Final    BOTTLES DRAWN AEROBIC AND ANAEROBIC Blood Culture adequate volume   Culture   Final    NO GROWTH 5 DAYS Performed at [facility], [place] [address] [place], [place] [address]    Report Status 09/05/2019 FINAL  Final  Culture, blood (Routine x  2)     Status: None   Collection Time: 08/31/19 11:30 PM   Specimen: BLOOD  Result Value Ref Range Status   Specimen Description BLOOD RIGHT ANTECUBITAL  Final   Special Requests   Final    BOTTLES DRAWN AEROBIC AND ANAEROBIC Blood Culture  adequate volume   Culture   Final    NO GROWTH 5 DAYS Performed at [facility], [address], [place], [place] [address]    Report Status 09/05/2019 FINAL  Final  Urine culture     Status: None   Collection Time: 09/01/19 12:24 AM   Specimen: In/Out Cath Urine  Result Value Ref Range Status   Specimen Description IN/OUT CATH URINE  Final   Special Requests NONE  Final   Culture   Final    NO GROWTH Performed at [facility], [place] [address] [place], [place] [address]    Report Status 09/02/2019 FINAL  Final  Culture, respiratory (non-expectorated)     Status: None   Collection Time: 09/01/19  4:51 AM   Specimen: Tracheal Aspirate; Respiratory  Result Value Ref Range Status   Specimen Description TRACHEAL ASPIRATE  Final   Special Requests NONE  Final   Gram Stain   Final    RARE WBC PRESENT, PREDOMINANTLY PMN RARE GRAM POSITIVE COCCI IN PAIRS RARE BUDDING YEAST SEEN    Culture   Final    FEW Consistent with normal respiratory flora. Performed at [facility], [place] [address] [place], [place] [address]    Report Status 09/03/2019 FINAL  Final  MRSA PCR Screening     Status: None   Collection Time: 09/01/19  4:51 AM   Specimen: Nasopharyngeal  Result Value Ref Range Status   MRSA by PCR NEGATIVE NEGATIVE Final    Comment:        The GeneXpert MRSA Assay (FDA approved for NASAL specimens only), is one component of a comprehensive MRSA colonization surveillance program. It is not intended to diagnose MRSA infection nor to guide or monitor treatment for MRSA infections. Performed at [facility], [place] [address] [place],  [address]     No results for input(s): LIPASE, AMYLASE in the last 168 hours. No results for input(s): AMMONIA in the last 168 hours.  Cardiac Enzymes: No results for input(s): CKTOTAL, CKMB, CKMBINDEX, TROPONINI in the last 168 hours. BNP (last 3 results) Recent Labs    03/04/19 0455 08/31/19 2258  BNP 124.0*  91.7       Admission status: Inpatient: Based on patients clinical presentation and evaluation of above clinical data, I have made determination that patient meets Inpatient criteria at this time.   Oswald Hillock   [facility] If 7PM-7AM, please contact night-coverage at [email or web address], Office  [phone]  password [email or web address]  09/06/2019, 3:35 PM  LOS: 5 days

## 2019-09-07 DIAGNOSIS — Z01818 Encounter for other preprocedural examination: Secondary | ICD-10-CM

## 2019-09-07 LAB — GLUCOSE, CAPILLARY
Glucose-Capillary: 126 mg/dL — ABNORMAL HIGH (ref 70–99)
Glucose-Capillary: 131 mg/dL — ABNORMAL HIGH (ref 70–99)
Glucose-Capillary: 149 mg/dL — ABNORMAL HIGH (ref 70–99)
Glucose-Capillary: 159 mg/dL — ABNORMAL HIGH (ref 70–99)
Glucose-Capillary: 168 mg/dL — ABNORMAL HIGH (ref 70–99)
Glucose-Capillary: 206 mg/dL — ABNORMAL HIGH (ref 70–99)

## 2019-09-07 LAB — BASIC METABOLIC PANEL
Anion gap: 10 (ref 5–15)
BUN: 23 mg/dL (ref 8–23)
CO2: 27 mmol/L (ref 22–32)
Calcium: 8.7 mg/dL — ABNORMAL LOW (ref 8.9–10.3)
Chloride: 106 mmol/L (ref 98–111)
Creatinine, Ser: 0.66 mg/dL (ref 0.61–1.24)
GFR calc Af Amer: >60 mL/min (ref >60)
GFR calc non Af Amer: >60 mL/min (ref >60)
Glucose, Bld: 147 mg/dL — ABNORMAL HIGH (ref 70–99)
Potassium: 3.4 mmol/L — ABNORMAL LOW (ref 3.5–5.1)
Sodium: 143 mmol/L (ref 135–145)

## 2019-09-07 MED ORDER — POTASSIUM CHLORIDE 20 MEQ/15ML (10%) PO SOLN
20.0000 meq | Freq: Once | ORAL | Status: AC
  Administered 2019-09-07: 20 meq via ORAL
  Filled 2019-09-07: qty 15

## 2019-09-07 MED ORDER — POTASSIUM CHLORIDE CRYS ER 20 MEQ PO TBCR: 20.0000 meq | EXTENDED_RELEASE_TABLET | Freq: Once | ORAL | Status: DC

## 2019-09-07 MED ORDER — DEXAMETHASONE 6 MG PO TABS: 6.0000 mg | ORAL_TABLET | Freq: Every day | ORAL | 0 refills | Status: DC

## 2019-09-07 MED ORDER — GUAIFENESIN ER 600 MG PO TB12: 600.0000 mg | ORAL_TABLET | Freq: Two times a day (BID) | ORAL | Status: AC

## 2019-09-07 MED ORDER — VITAMIN D3 25 MCG PO TABS: 1000.0000 [IU] | ORAL_TABLET | Freq: Every day | ORAL | Status: AC

## 2019-09-07 NOTE — Discharge Summary (Signed)
Physician Discharge Summary  John Blackburn [id number] DOB: 05-Feb-1946 DOA: 08/31/2019  PCP: Hoyt Koch, MD  Admit date: 08/31/2019 Discharge date: 09/07/2019  Time spent: 50 minutes  Recommendations for Outpatient Follow-up:  1. Quarantine till September 21, 2019. 2. Decadron 6 mg p.o. daily for 5 more days, stop after 09/12/2019. 3. Check BMP in 3 days   Discharge Diagnoses:  Active Problems:   Respiratory failure ([place])   COVID-19   Discharge Condition: Stable  Diet recommendation: Dysphagia one diet with nectar thick liquid  Filed Weights   09/05/19 0451 09/06/19 0410 09/07/19 0423  Weight: 94.9 kg 97.4 kg 96.1 kg    History of present illness:  74 year old male with a history of hypertension, hyperlipidemia, GERD, depression, CAD s/p stent placement, PE not on anticoagulation, diastolic CHF, OSA, memory loss was brought to ED after he had episode of nausea vomiting and dyspnea. Patient developed altered mental status and hypoxia in the ED required intubation, was transferred to ICU. He was found to be COVID-19 positive. Patient was extubated on 09/02/2019. [facility] assumed care on 09/04/2019.   Hospital Course:   1. Acute hypoxic respiratory failure-secondary to aspiration pneumonia and Covid pneumonia. Patient was treated with remdesivir for 5 days. Also started on Decadron 6 mg daily. Will discharge on Decadron 6 mg daily for 5 more days, stop after 09/12/2019.  He was also treated with IV Zosyn for 7 days.  Last CRP was 2.5 on 08/31/2019.  Patient O2 sats currently is 93% on room air., CRP 8.1, ferritin 362.   Repeat Covid labs on 09/06/2019  COVID-19 Labs  Recent Labs    09/06/19 0310  DDIMER 1.16*  CRP 3.5*    Lab Results  Component Value Date   SARSCOV2NAA POSITIVE (A) 08/31/2019   [place] NEGATIVE 03/21/2019   [place] NOT DETECTED [id number]  2.   2. Metabolic encephalopathy-secondary to hypoxic respiratory failure, he has memory loss. He  slowly improving, patient has baseline dementia. Continue Namenda.  3. History of CAD s/p stent placement, chronic diastolic CHF-continue aspirin, Plavix, Lipitor.  4. Steroid-induced hyperglycemia-patient was treated with sliding scale insulin with NovoLog.  5. Hypokalemia-potassium was 3.4 this morning, will give K. Dur 20 mEq p.o. x1. Blood glucose is stable. He will stop Decadron after 09/12/2019.   Procedures:  ETT from 08/31/2019 to 09/02/2019  Consultations:  PCCM  Discharge Exam: Vitals:   09/07/19 0420 09/07/19 0740  BP: 122/66   Pulse: 61   Resp: 20   Temp: (!) 97.5 F (36.4 C)   SpO2: 94% 93%    General: Appears in no acute distress Cardiovascular: S1-S2, regular, no murmur auscultated Respiratory: Clear to auscultation bilaterally  Discharge Instructions   Discharge Instructions    Diet - low sodium heart healthy   Complete by: As directed    Discharge instructions   Complete by: As directed    Quarantine till 09/21/2019.  Person Under Monitoring Name: John Blackburn  Location: [place] [address]   Infection Prevention Recommendations for Individuals Confirmed to have, or Being Evaluated for, 2019 Novel Coronavirus (COVID-19) Infection Who Receive Care at Home  Individuals who are confirmed to have, or are being evaluated for, COVID-19 should follow the prevention steps below until a healthcare provider or local or state health department says they can return to normal activities.  Stay home except to get medical care You should restrict activities outside your home, except for getting medical care. Do not go to work, school, or  public areas, and do not use public transportation or taxis.  Call ahead before visiting your doctor Before your medical appointment, call the healthcare provider and tell them that you have, or are being evaluated for, COVID-19 infection. This will help the healthcare provider's office take steps  to keep other people from getting infected. Ask your healthcare provider to call the local or state health department.  Monitor your symptoms Seek prompt medical attention if your illness is worsening (e.g., difficulty breathing). Before going to your medical appointment, call the healthcare provider and tell them that you have, or are being evaluated for, COVID-19 infection. Ask your healthcare provider to call the local or state health department.  Wear a facemask You should wear a facemask that covers your nose and mouth when you are in the same room with other people and when you visit a healthcare provider. People who live with or visit you should also wear a facemask while they are in the same room with you.  Separate yourself from other people in your home As much as possible, you should stay in a different room from other people in your home. Also, you should use a separate bathroom, if available.  Avoid sharing household items You should not share dishes, drinking glasses, cups, eating utensils, towels, bedding, or other items with other people in your home. After using these items, you should wash them thoroughly with soap and water.  Cover your coughs and sneezes Cover your mouth and nose with a tissue when you cough or sneeze, or you can cough or sneeze into your sleeve. Throw used tissues in a lined trash can, and immediately wash your hands with soap and water for at least 20 seconds or use an alcohol-based hand rub.  Wash your [facility] your hands often and thoroughly with soap and water for at least 20 seconds. You can use an alcohol-based hand sanitizer if soap and water are not available and if your hands are not visibly dirty. Avoid touching your eyes, nose, and mouth with unwashed hands.   Prevention Steps for Caregivers and Household Members of Individuals Confirmed to have, or Being Evaluated for, COVID-19 Infection Being Cared for in the Home  If you live  with, or provide care at home for, a person confirmed to have, or being evaluated for, COVID-19 infection please follow these guidelines to prevent infection:  Follow healthcare provider's instructions Make sure that you understand and can help the patient follow any healthcare provider instructions for all care.  Provide for the patient's basic needs You should help the patient with basic needs in the home and provide support for getting groceries, prescriptions, and other personal needs.  Monitor the patient's symptoms If they are getting sicker, call his or her medical provider and tell them that the patient has, or is being evaluated for, COVID-19 infection. This will help the healthcare provider's office take steps to keep other people from getting infected. Ask the healthcare provider to call the local or state health department.  Limit the number of people who have contact with the patient If possible, have only one caregiver for the patient. Other household members should stay in another home or place of residence. If this is not possible, they should stay in another room, or be separated from the patient as much as possible. Use a separate bathroom, if available. Restrict visitors who do not have an essential need to be in the home.  Keep older adults, very young children, and  other sick people away from the patient Keep older adults, very young children, and those who have compromised immune systems or chronic health conditions away from the patient. This includes people with chronic heart, lung, or kidney conditions, diabetes, and cancer.  Ensure good ventilation Make sure that shared spaces in the home have good air flow, such as from an air conditioner or an opened window, weather permitting.  Wash your hands often Wash your hands often and thoroughly with soap and water for at least 20 seconds. You can use an alcohol based hand sanitizer if soap and water are not available  and if your hands are not visibly dirty. Avoid touching your eyes, nose, and mouth with unwashed hands. Use disposable paper towels to dry your hands. If not available, use dedicated cloth towels and replace them when they become wet.  Wear a facemask and gloves Wear a disposable facemask at all times in the room and gloves when you touch or have contact with the patient's blood, body fluids, and/or secretions or excretions, such as sweat, saliva, sputum, nasal mucus, vomit, urine, or feces.  Ensure the mask fits over your nose and mouth tightly, and do not touch it during use. Throw out disposable facemasks and gloves after using them. Do not reuse. Wash your hands immediately after removing your facemask and gloves. If your personal clothing becomes contaminated, carefully remove clothing and launder. Wash your hands after handling contaminated clothing. Place all used disposable facemasks, gloves, and other waste in a lined container before disposing them with other household waste. Remove gloves and wash your hands immediately after handling these items.  Do not share dishes, glasses, or other household items with the patient Avoid sharing household items. You should not share dishes, drinking glasses, cups, eating utensils, towels, bedding, or other items with a patient who is confirmed to have, or being evaluated for, COVID-19 infection. After the person uses these items, you should wash them thoroughly with soap and water.  Wash laundry thoroughly Immediately remove and wash clothes or bedding that have blood, body fluids, and/or secretions or excretions, such as sweat, saliva, sputum, nasal mucus, vomit, urine, or feces, on them. Wear gloves when handling laundry from the patient. Read and follow directions on labels of laundry or clothing items and detergent. In general, wash and dry with the warmest temperatures recommended on the label.  Clean all areas the individual has used  often Clean all touchable surfaces, such as counters, tabletops, doorknobs, bathroom fixtures, toilets, phones, keyboards, tablets, and bedside tables, every day. Also, clean any surfaces that may have blood, body fluids, and/or secretions or excretions on them. Wear gloves when cleaning surfaces the patient has come in contact with. Use a diluted bleach solution (e.g., dilute bleach with 1 part bleach and 10 parts water) or a household disinfectant with a label that says EPA-registered for coronaviruses. To make a bleach solution at home, add 1 tablespoon of bleach to 1 quart (4 cups) of water. For a larger supply, add  cup of bleach to 1 gallon (16 cups) of water. Read labels of cleaning products and follow recommendations provided on product labels. Labels contain instructions for safe and effective use of the cleaning product including precautions you should take when applying the product, such as wearing gloves or eye protection and making sure you have good ventilation during use of the product. Remove gloves and wash hands immediately after cleaning.  Monitor yourself for signs and symptoms of illness Caregivers and household  members are considered close contacts, should monitor their health, and will be asked to limit movement outside of the home to the extent possible. Follow the monitoring steps for close contacts listed on the symptom monitoring form.   ? If you have additional questions, contact your local health department or call the epidemiologist on call at [phone] (available 24/7). ? This guidance is subject to change. For the most up-to-date guidance from [facility], please refer to their website: YouBlogs.pl   Increase activity slowly   Complete by: As directed      Allergies as of 09/07/2019      Reactions   Cephalexin Rash   Levofloxacin Rash      Medication List    TAKE these medications   amLODipine 5 MG  tablet Commonly known as: NORVASC Take 1 tablet (5 mg total) by mouth daily.   aspirin 81 MG EC tablet Take 1 tablet (81 mg total) by mouth daily.   atorvastatin 40 MG tablet Commonly known as: LIPITOR Take 40 mg by mouth daily at 6 PM.   clopidogrel 75 MG tablet Commonly known as: PLAVIX Take 1 tablet (75 mg total) by mouth daily. Patient will be scheduled for AUGUST 2020. What changed: additional instructions   dexamethasone 6 MG tablet Commonly known as: DECADRON Take 1 tablet (6 mg total) by mouth daily for 5 days.   escitalopram 10 MG tablet Commonly known as: LEXAPRO Take 1 tablet by mouth once daily   guaiFENesin 600 MG 12 hr tablet Commonly known as: MUCINEX Take 1 tablet (600 mg total) by mouth 2 (two) times daily.   lactose free nutrition Liqd Take 237 mLs by mouth 2 (two) times daily between meals.   loratadine 10 MG tablet Commonly known as: CLARITIN Take 1 tablet (10 mg total) by mouth daily as needed for allergies.   Melatonin 3 MG Tabs Take 3 mg by mouth at bedtime.   memantine 7 MG Cp24 24 hr capsule Commonly known as: NAMENDA XR TAKE 1  BY MOUTH ONCE DAILY What changed: See the new instructions.   nitroGLYCERIN 0.4 MG SL tablet Commonly known as: Nitrostat Place 1 tablet (0.4 mg total) under the tongue every 5 (five) minutes as needed. What changed:   when to take this  reasons to take this   pantoprazole 40 MG tablet Commonly known as: PROTONIX Take 1 tablet (40 mg total) by mouth daily. Patient will be scheduled for AUGUST 2020. What changed: additional instructions   polyethylene glycol powder 17 GM/SCOOP powder Commonly known as: GLYCOLAX/MIRALAX Take 17 g by mouth daily. MIX INTO 4-8 OUNCES OF LIQUID   PRESCRIPTION MEDICATION See admin instructions. CPAP- At bedtime (remove each morning)   senna-docusate 8.6-50 MG tablet Commonly known as: Senokot-S Take 1 tablet by mouth 2 (two) times daily as needed for mild constipation. What  changed: when to take this   vitamin B-12 1000 MCG tablet Commonly known as: CYANOCOBALAMIN Take 1,000 mcg by mouth daily.   Vitamin D3 25 MCG tablet Commonly known as: Vitamin D Take 1 tablet (1,000 Units total) by mouth daily. Start taking on: September 08, 2019      Allergies  Allergen Reactions  . Cephalexin Rash  . Levofloxacin Rash      The results of significant diagnostics from this hospitalization (including imaging, microbiology, ancillary and laboratory) are listed below for reference.    Significant Diagnostic Studies: CT ANGIO CHEST PE W OR WO CONTRAST  Result Date: 09/01/2019 CLINICAL DATA:  Hypoxemia EXAM:  CT ANGIOGRAPHY CHEST WITH CONTRAST TECHNIQUE: Multidetector CT imaging of the chest was performed using the standard protocol during bolus administration of intravenous contrast. Multiplanar CT image reconstructions and MIPs were obtained to evaluate the vascular anatomy. CONTRAST:  187mL OMNIPAQUE IOHEXOL 350 MG/ML SOLN COMPARISON:  CT angiography 06/25/2013, chest radiograph 08/31/2019 FINDINGS: Cardiovascular: Satisfactory opacification of the pulmonary arteries to the segmental level. No acute or chronic pulmonary arterial filling defects are identified with resolution of the extensive bilateral emboli seen on comparison from 2014. Central pulmonary arteries are normal caliber. There is a trace pericardial effusion. Cardiac size is within normal limits. Coronary artery calcifications are noted. Ascending thoracic aorta is mildly dilated and measures up to 4.2 cm in diameter. Atherosclerotic plaque seen through the thoracic aorta and proximal great vessels with normal 3 vessel branching of the aortic arch. Mediastinum/Nodes: Few scattered low-attenuation lymph nodes are present in the mediastinum and hila. Index lymph node in the subcarinal region measures to 12 mm not significantly changed from comparison study. An additional enlarged 10 mm right precarinal lymph node is  unchanged from prior as well. No axillary adenopathy. No acute abnormality of the thyroid gland and thoracic inlet. Patient is intubated at the time of exam with the endotracheal tube positioned 3.8 cm from the carina. A transesophageal tube tip is in place with the tip terminating in the gastric body and the side port positioned near the GE junction. Lungs/Pleura: Dense consolidative opacities present in the lung bases with associated volume loss. There are some airways thickening and scattered secretions with several fluid-filled airways in the regions of consolidation as well as some patchy parenchymal hypoattenuation suggesting underlying pneumonia. Additional patchy areas of airspace disease are seen in the superior segments of both lower lobes in the posterior segment of the right upper lobe and within the lingula. Trace left effusion is noted. Scattered calcified granulomata noted within the lung parenchyma. Upper Abdomen: Patient is post cholecystectomy with surgical clips in the gallbladder fossa. Mild bilateral symmetric perinephric stranding, a nonspecific finding though may correlate with either age or decreased renal function. Partial fatty replacement of the pancreas. Musculoskeletal: Multilevel degenerative changes are present in the imaged portions of the spine. Multilevel flowing anterior osteophytosis, compatible with features of diffuse idiopathic skeletal hyperostosis (DISH). Indeterminate sclerotic lesion seen in the T12 level, new from comparison study. Review of the MIP images confirms the above findings. IMPRESSION: 1. No evidence of acute or chronic pulmonary embolus. 2. Dense consolidative opacities in the lung bases with fluid-filled airways and some associated volume loss. Findings are compatible with pneumonia and/or aspiration. Additional areas of patchy nodular airspace disease are seen in the upper lobes and lingula as well. Recommend 6-8 week follow-up CT to ensure resolution. 3.  Trace left pleural effusion and pericardial effusion. 4. Coronary artery disease.  Aortic Atherosclerosis (ICD10-I70.0). 5. Indeterminate sclerotic lesion in the T12 level, new from comparison study. If there is concern for osseous metastatic disease, further evaluation with nonemergent outpatient bone scan may be useful. 6. Additional chronic and incidental findings as detailed above. 7. Mild aneurysmal dilatation of the ascending thoracic aorta, measuring up to 4.2 cm in diameter. Recommend annual imaging followup by CTA or MRA. This recommendation follows 2010 ACCF/AHA/AATS/ACR/ASA/SCA/SCAI/SIR/STS/SVM Guidelines for the Diagnosis and Management of Patients with Thoracic Aortic Disease. Circulation. 2010; [id number]. Aortic aneurysm NOS (ICD10-I71.9) 8. Mild bilateral symmetric perinephric stranding, a nonspecific finding though may correlate with either age or decreased renal function. If there is concern for infection, correlation  with urinalysis may be useful. Electronically Signed   By: Lovena Le M.D.   On: 09/01/2019 03:49   DG Chest Port 1 View  Result Date: 09/02/2019 CLINICAL DATA:  Respiratory failure.  COVID pneumonia. EXAM: PORTABLE CHEST 1 VIEW COMPARISON:  Chest x-ray 08/31/2019 FINDINGS: The endotracheal tube is 6 cm above the carina. The NG tube is coursing down the esophagus and into the stomach. Persistent bibasilar infiltrates but improved aeration since prior film. Possible small left effusion. IMPRESSION: 1. Stable support apparatus. 2. Persistent bibasilar infiltrates but slight improved overall aeration. Electronically Signed   By: Marijo Sanes M.D.   On: 09/02/2019 05:54   DG Chest Port 1 View  Result Date: 08/31/2019 CLINICAL DATA:  Post intubation EXAM: PORTABLE CHEST 1 VIEW COMPARISON:  Radiograph 03/15/2019 FINDINGS: Endotracheal tube terminates in the mid trachea approximately 5 cm from the level of the carina. A transesophageal tube tip and side port are below the  level of imaging, at or below the GE junction. Telemetry leads overlie the chest. There are dense patchy perihilar and basilar airspace opacities with mixed hazy interstitial opacity more pronounced in the right lung than left. No visible pneumothorax or effusion the portion of the costophrenic sulci are collimated. Included cardiomediastinal contours are unremarkable. No acute osseous or soft tissue abnormality. Degenerative changes are present in the imaged spine and shoulders. IMPRESSION: 1. Endotracheal tube 5 cm from the level of the carina. 2. Transesophageal tip and side port below the level of imaging, at or below the GE junction. 3. Dense patchy perihilar and basilar airspace opacities with mixed hazy interstitial opacity. Findings may reflect multifocal pneumonia or edema. Electronically Signed   By: Lovena Le M.D.   On: 08/31/2019 23:06    Microbiology: Recent Results (from the past 240 hour(s))  Respiratory Panel by RT PCR (Flu A&B, Covid) - Nasopharyngeal Swab     Status: Abnormal   Collection Time: 08/31/19 10:48 PM   Specimen: Nasopharyngeal Swab  Result Value Ref Range Status   SARS Coronavirus 2 by RT PCR POSITIVE (A) NEGATIVE Final    Comment: RESULT CALLED TO, READ BACK BY AND VERIFIED WITH: RN ANDREW CAIN AT 0038 BY [place] ON 09/01/2019 (NOTE) SARS-CoV-2 target nucleic acids are DETECTED. SARS-CoV-2 RNA is generally detectable in upper respiratory specimens  during the acute phase of infection. Positive results are indicative of the presence of the identified virus, but do not rule out bacterial infection or co-infection with other pathogens not detected by the test. Clinical correlation with patient history and other diagnostic information is necessary to determine patient infection status. The expected result is Negative. Fact Sheet for Patients:  PinkCheek.be Fact Sheet for Healthcare  Providers: GravelBags.it This test is not yet approved or cleared by the [place] FDA and  has been authorized for detection and/or diagnosis of SARS-CoV-2 by FDA under an Emergency Use Authorization (EUA).  This EUA will remain in effect (meaning th is test can be used) for the duration of  the COVID-19 declaration under Section 564(b)(1) of the Act, 21 U.S.C. section 360bbb-3(b)(1), unless the authorization is terminated or revoked sooner.    Influenza A by PCR NEGATIVE NEGATIVE Final   Influenza B by PCR NEGATIVE NEGATIVE Final    Comment: (NOTE) The Xpert Xpress SARS-CoV-2/FLU/RSV assay is intended as an aid in  the diagnosis of influenza from Nasopharyngeal swab specimens and  should not be used as a sole basis for treatment. Nasal washings and  aspirates are unacceptable  for Xpert Xpress SARS-CoV-2/FLU/RSV  testing. Fact Sheet for Patients: PinkCheek.be Fact Sheet for Healthcare Providers: GravelBags.it This test is not yet approved or cleared by the [place] FDA and  has been authorized for detection and/or diagnosis of SARS-CoV-2 by  FDA under an Emergency Use Authorization (EUA). This EUA will remain  in effect (meaning this test can be used) for the duration of the  Covid-19 declaration under Section 564(b)(1) of the Act, 21  U.S.C. section 360bbb-3(b)(1), unless the authorization is  terminated or revoked. Performed at [facility], [place] [address], [place], [place] [address]   Culture, blood (Routine x 2)     Status: None   Collection Time: 08/31/19 10:55 PM   Specimen: BLOOD LEFT WRIST  Result Value Ref Range Status   Specimen Description BLOOD LEFT WRIST  Final   Special Requests   Final    BOTTLES DRAWN AEROBIC AND ANAEROBIC Blood Culture adequate volume   Culture   Final    NO GROWTH 5 DAYS Performed at [facility], [place] [address], [place], [place]  [address]    Report Status 09/05/2019 FINAL  Final  Culture, blood (Routine x 2)     Status: None   Collection Time: 08/31/19 11:30 PM   Specimen: BLOOD  Result Value Ref Range Status   Specimen Description BLOOD RIGHT ANTECUBITAL  Final   Special Requests   Final    BOTTLES DRAWN AEROBIC AND ANAEROBIC Blood Culture adequate volume   Culture   Final    NO GROWTH 5 DAYS Performed at [facility], [place] [address], [place], [place] [address]    Report Status 09/05/2019 FINAL  Final  Urine culture     Status: None   Collection Time: 09/01/19 12:24 AM   Specimen: In/Out Cath Urine  Result Value Ref Range Status   Specimen Description IN/OUT CATH URINE  Final   Special Requests NONE  Final   Culture   Final    NO GROWTH Performed at [facility], [place] [address], [place], [place] [address]    Report Status 09/02/2019 FINAL  Final  Culture, respiratory (non-expectorated)     Status: None   Collection Time: 09/01/19  4:51 AM   Specimen: Tracheal Aspirate; Respiratory  Result Value Ref Range Status   Specimen Description TRACHEAL ASPIRATE  Final   Special Requests NONE  Final   Gram Stain   Final    RARE WBC PRESENT, PREDOMINANTLY PMN RARE GRAM POSITIVE COCCI IN PAIRS RARE BUDDING YEAST SEEN    Culture   Final    FEW Consistent with normal respiratory flora. Performed at [facility], [place] [address], [place], [place] [address]    Report Status 09/03/2019 FINAL  Final  MRSA PCR Screening     Status: None   Collection Time: 09/01/19  4:51 AM   Specimen: Nasopharyngeal  Result Value Ref Range Status   MRSA by PCR NEGATIVE NEGATIVE Final    Comment:        The GeneXpert MRSA Assay (FDA approved for NASAL specimens only), is one component of a comprehensive MRSA colonization surveillance program. It is not intended to diagnose MRSA infection nor to guide or monitor treatment for MRSA infections. Performed at [facility], [place] [address], [place],  [place] [address]      Labs: Basic Metabolic Panel: Recent Labs  Lab 09/01/19 0500 09/01/19 1200 09/01/19 1700 09/02/19 0500 09/02/19 1637 09/03/19 0500 09/04/19  0500 09/06/19 0310 09/07/19 0500  NA 140  --   --  139  --  141 141 142 143  K 3.1*  --   --  4.0  --  3.0* 3.4* 2.9* 3.4*  CL 108  --   --  102  --  100 102 106 106  CO2 20*  --   --  24  --  28 27 26 27   GLUCOSE 206*  --   --  170*  --  92 110* 212* 147*  BUN 19  --   --  33*  --  17 15 26* 23  CREATININE 0.95  --   --  1.18  --  0.69 0.88 0.77 0.66  CALCIUM 8.4*  --   --  8.9  --  8.6* 8.7* 8.5* 8.7*  MG 2.0 2.2 2.1 2.2 2.2  --   --   --   --   PHOS 4.6 4.8* 3.8 3.6 2.1*  --   --   --   --    Liver Function Tests: Recent Labs  Lab 08/31/19 2242 09/02/19 0500 09/03/19 0500 09/04/19 0500  AST 45* 29 37 35  ALT 36 29 31 34  ALKPHOS 94 60 74 73  BILITOT 0.4 0.6 0.8 0.8  PROT 6.9 6.7 7.0 6.6  ALBUMIN 3.2* 2.8* 3.0* 2.9*   No results for input(s): LIPASE, AMYLASE in the last 168 hours. No results for input(s): AMMONIA in the last 168 hours. CBC: Recent Labs  Lab 08/31/19 2242 09/01/19 0500 09/02/19 0500 09/03/19 0500 09/04/19 0500 09/05/19 0619  WBC 3.9* 11.3* 20.4* 19.1* 14.5* 9.7  NEUTROABS 2.7  --   --   --   --   --   HGB 16.5 16.1 14.7 15.5 15.4 13.9  HCT 50.2 46.9 44.0 45.6 44.7 40.9  MCV 96.0 93.8 95.4 93.6 92.9 93.0  PLT 197 189 178 195 197 187    BNP (last 3 results) Recent Labs    03/04/19 0455 08/31/19 2258  BNP 124.0* 91.7    CBG: Recent Labs  Lab 09/06/19 2009 09/07/19 0006 09/07/19 0419 09/07/19 0807 09/07/19 1147  GLUCAP 177* 168* 159* 126* 206*       Signed:  Oswald Hillock MD.  [facility] 09/07/2019, 12:17 PM

## 2019-09-07 NOTE — TOC Progression Note (Addendum)
Transition of Care [facility]) - Progression Note    Patient Details  Name: ARYEN KISE MRN: [id number] Date of Birth: Nov 11, 1945  Transition of Care [facility]) CM/SW [place], [place] Phone Number: 09/07/2019, 12:50 PM  Clinical Narrative:  12:59pm- CSW spoke with admissions liaison [place], she will provide information for travel arrangements on Monday. Have updated MD and RN and will leave information for floor coverage to f/u on Monday.     12:50pm- CSW has spoken with RN and MD. Await information regarding transportation from [place] with [facility] since pt is going to sister facility in [place]. At this time have not received any updates since this morning from liaison, will continue to reach out.    Expected Discharge Plan: [place] Barriers to Discharge: Continued Medical Work up, Ship broker  Expected Discharge Plan and Services Expected Discharge Plan: [place] Choice: [place] arrangements for the past 2 months: [place]   Social Determinants of Health (SDOH) Interventions    Readmission Risk Interventions No flowsheet data found.

## 2019-09-08 LAB — GLUCOSE, CAPILLARY
Glucose-Capillary: 111 mg/dL — ABNORMAL HIGH (ref 70–99)
Glucose-Capillary: 128 mg/dL — ABNORMAL HIGH (ref 70–99)
Glucose-Capillary: 140 mg/dL — ABNORMAL HIGH (ref 70–99)
Glucose-Capillary: 153 mg/dL — ABNORMAL HIGH (ref 70–99)
Glucose-Capillary: 179 mg/dL — ABNORMAL HIGH (ref 70–99)
Glucose-Capillary: 231 mg/dL — ABNORMAL HIGH (ref 70–99)

## 2019-09-08 MED ORDER — POTASSIUM CHLORIDE 20 MEQ/15ML (10%) PO SOLN
20.0000 meq | Freq: Once | ORAL | Status: AC
  Administered 2019-09-08: 20 meq via ORAL
  Filled 2019-09-08: qty 15

## 2019-09-08 NOTE — Discharge Summary (Signed)
Discharge Summary  John Blackburn [id number] DOB: 1946-07-23  PCP: John Koch, MD  Admit date: 08/31/2019 Discharge date: 09/08/2019  Time spent: 35 minutes   Recommendations for Outpatient Follow-up:  1. Quarantine till September 21, 2019. 2. Decadron 6 mg p.o. daily for 5 more days, stop after 09/12/2019. 3. Check BMP in 3 days   Discharge Diagnoses:  Active Hospital Problems   Diagnosis Date Noted  . Respiratory failure ([place]) 09/01/2019  . COVID-19 09/01/2019    Resolved Hospital Problems  No resolved problems to display.    Discharge Condition: Stable.  Diet recommendation: Resume previous diet.  Vitals:   09/07/19 2026 09/08/19 0430  BP: 131/76 136/76  Pulse: 69 62  Resp: (!) 27 18  Temp: 98.7 F (37.1 C) 98.5 F (36.9 C)  SpO2: 96% 95%    History of present illness:  History of present illness:  74 year old male with a history of hypertension, hyperlipidemia, GERD, depression, CAD s/p stent placement, PE not on anticoagulation, diastolic CHF, OSA, memory loss was brought to ED after he had episode of nausea vomiting and dyspnea. Patient developed altered mental status and hypoxia in the ED required intubation, was transferred to ICU. He was found to be COVID-19 positive. Patient was extubated on 09/02/2019. [facility] assumed care on 09/04/2019.  Completed course of remdesivir.  Also completed 7 days of IV Zosyn for superimposed bacterial pneumonia.  09/08/19: Seen and examined.  No acute events overnight.  He has no new complaints.  Vital signs and labs reviewed and are stable.  On the day of discharge, the patient was hemodynamically stable.  He will need to take his medications as prescribed and follow-up with his primary care provider posthospitalization.   Hospital Course:  Active Problems:   Respiratory failure ([place])   COVID-19  1. Resolved acute hypoxic respiratory failure-secondary to aspiration pneumonia and Covid pneumonia. Patient was  treated with remdesivir for 5 days. Also started on Decadron 6 mg daily. Will discharge on Decadron 6 mg daily for 5 more days, stop after 09/12/2019.  He was also treated with IV Zosyn for 7 days.  Last CRP was 2.5 on 08/31/2019.Patient O2 sats currently is 94% on room air., CRP 8.1, ferritin 362.  Repeat Covid labs on 09/06/2019.  CRP trending down.  COVID-19 Labs  Recent Labs (last 2 labs)     6.  Recent Labs 6.    6. 09/06/19 7. 0310 6.  DDIMER 6. 1.16* 6.  CRP 6. 3.5*    6.  7.  Recent Labs      6.  Lab Results 6.  Component 6. Value 6. Date 6.   6. [place] 6. POSITIVE (A) 6. 08/31/2019 6.   6. [place] 6. NEGATIVE 6. 03/21/2019 6.   6. [place] 6. NOT DETECTED 6. 03/11/2019    2.   2. Metabolic encephalopathy-secondary to hypoxic respiratory failure, he has memory loss. He slowly improving, patient has baseline dementia. Continue Namenda.  3. History of CAD s/p stent placement, chronic diastolic CHF-continue aspirin, Plavix, Lipitor.  4. Steroid-induced hyperglycemia-patient was treated with sliding scale insulin with NovoLog.  5. Hypokalemia-potassium was 3.4 this morning, will give K. Dur 20 mEq p.o. x1. Blood glucose is stable. He will stop Decadron after 09/12/2019.   Procedures:  ETT from 08/31/2019 to 09/02/2019  Consultations:  PCCM    Discharge Exam: BP 136/76 (BP Location: Right Arm)   Pulse 62   Temp 98.5 F (36.9 C) (Axillary)   Resp 18  Ht 5\' 11"  (1.803 m)   Wt 98.5 kg   SpO2 95%   BMI 30.29 kg/m  . General: 74 y.o. year-old male well developed well nourished in no acute distress.  Alert and oriented x3. . Cardiovascular: Regular rate and rhythm with no rubs or gallops.  No thyromegaly or JVD noted.   [name] Respiratory: Clear to auscultation.  No wheezing noted.  Good inspiratory effort. . Abdomen: Soft nontender nondistended with normal bowel sounds x4 quadrants. . Musculoskeletal: Trace lower extremity edema.   [name] Psychiatry: Mood is appropriate for condition and setting  Discharge Instructions You were cared for by a hospitalist during your hospital stay. If you have any questions about your discharge medications or the care you received while you were in the hospital after you are discharged, you can call the unit and asked to speak with the hospitalist on call if the hospitalist that took care of you is not available. Once you are discharged, your primary care physician will handle any further medical issues. Please note that NO REFILLS for any discharge medications will be authorized once you are discharged, as it is imperative that you return to your primary care physician (or establish a relationship with a primary care physician if you do not have one) for your aftercare needs so that they can reassess your need for medications and monitor your lab values.  Discharge Instructions    Diet - low sodium heart healthy   Complete by: As directed    Discharge instructions   Complete by: As directed    Quarantine till 09/21/2019.  Person Under Monitoring Name: John Blackburn  Location: [place] [address]   Infection Prevention Recommendations for Individuals Confirmed to have, or Being Evaluated for, 2019 Novel Coronavirus (COVID-19) Infection Who Receive Care at Home  Individuals who are confirmed to have, or are being evaluated for, COVID-19 should follow the prevention steps below until a healthcare provider or local or state health department says they can return to normal activities.  Stay home except to get medical care You should restrict activities outside your home, except for getting medical care. Do not go to work, school, or public areas, and do not use public transportation or taxis.  Call ahead before visiting your doctor Before your medical appointment, call the healthcare provider and tell them that you have, or are being evaluated for, COVID-19  infection. This will help the healthcare provider's office take steps to keep other people from getting infected. Ask your healthcare provider to call the local or state health department.  Monitor your symptoms Seek prompt medical attention if your illness is worsening (e.g., difficulty breathing). Before going to your medical appointment, call the healthcare provider and tell them that you have, or are being evaluated for, COVID-19 infection. Ask your healthcare provider to call the local or state health department.  Wear a facemask You should wear a facemask that covers your nose and mouth when you are in the same room with other people and when you visit a healthcare provider. People who live with or visit you should also wear a facemask while they are in the same room with you.  Separate yourself from other people in your home As much as possible, you should stay in a different room from other people in your home. Also, you should use a separate bathroom, if available.  Avoid sharing household items You should not share dishes, drinking glasses, cups, eating utensils, towels, bedding, or other  items with other people in your home. After using these items, you should wash them thoroughly with soap and water.  Cover your coughs and sneezes Cover your mouth and nose with a tissue when you cough or sneeze, or you can cough or sneeze into your sleeve. Throw used tissues in a lined trash can, and immediately wash your hands with soap and water for at least 20 seconds or use an alcohol-based hand rub.  Wash your [facility] your hands often and thoroughly with soap and water for at least 20 seconds. You can use an alcohol-based hand sanitizer if soap and water are not available and if your hands are not visibly dirty. Avoid touching your eyes, nose, and mouth with unwashed hands.   Prevention Steps for Caregivers and Household Members of Individuals Confirmed to have, or Being Evaluated  for, COVID-19 Infection Being Cared for in the Home  If you live with, or provide care at home for, a person confirmed to have, or being evaluated for, COVID-19 infection please follow these guidelines to prevent infection:  Follow healthcare provider's instructions Make sure that you understand and can help the patient follow any healthcare provider instructions for all care.  Provide for the patient's basic needs You should help the patient with basic needs in the home and provide support for getting groceries, prescriptions, and other personal needs.  Monitor the patient's symptoms If they are getting sicker, call his or her medical provider and tell them that the patient has, or is being evaluated for, COVID-19 infection. This will help the healthcare provider's office take steps to keep other people from getting infected. Ask the healthcare provider to call the local or state health department.  Limit the number of people who have contact with the patient If possible, have only one caregiver for the patient. Other household members should stay in another home or place of residence. If this is not possible, they should stay in another room, or be separated from the patient as much as possible. Use a separate bathroom, if available. Restrict visitors who do not have an essential need to be in the home.  Keep older adults, very young children, and other sick people away from the patient Keep older adults, very young children, and those who have compromised immune systems or chronic health conditions away from the patient. This includes people with chronic heart, lung, or kidney conditions, diabetes, and cancer.  Ensure good ventilation Make sure that shared spaces in the home have good air flow, such as from an air conditioner or an opened window, weather permitting.  Wash your hands often Wash your hands often and thoroughly with soap and water for at least 20 seconds. You can use  an alcohol based hand sanitizer if soap and water are not available and if your hands are not visibly dirty. Avoid touching your eyes, nose, and mouth with unwashed hands. Use disposable paper towels to dry your hands. If not available, use dedicated cloth towels and replace them when they become wet.  Wear a facemask and gloves Wear a disposable facemask at all times in the room and gloves when you touch or have contact with the patient's blood, body fluids, and/or secretions or excretions, such as sweat, saliva, sputum, nasal mucus, vomit, urine, or feces.  Ensure the mask fits over your nose and mouth tightly, and do not touch it during use. Throw out disposable facemasks and gloves after using them. Do not reuse. Wash your hands immediately after  removing your facemask and gloves. If your personal clothing becomes contaminated, carefully remove clothing and launder. Wash your hands after handling contaminated clothing. Place all used disposable facemasks, gloves, and other waste in a lined container before disposing them with other household waste. Remove gloves and wash your hands immediately after handling these items.  Do not share dishes, glasses, or other household items with the patient Avoid sharing household items. You should not share dishes, drinking glasses, cups, eating utensils, towels, bedding, or other items with a patient who is confirmed to have, or being evaluated for, COVID-19 infection. After the person uses these items, you should wash them thoroughly with soap and water.  Wash laundry thoroughly Immediately remove and wash clothes or bedding that have blood, body fluids, and/or secretions or excretions, such as sweat, saliva, sputum, nasal mucus, vomit, urine, or feces, on them. Wear gloves when handling laundry from the patient. Read and follow directions on labels of laundry or clothing items and detergent. In general, wash and dry with the warmest temperatures  recommended on the label.  Clean all areas the individual has used often Clean all touchable surfaces, such as counters, tabletops, doorknobs, bathroom fixtures, toilets, phones, keyboards, tablets, and bedside tables, every day. Also, clean any surfaces that may have blood, body fluids, and/or secretions or excretions on them. Wear gloves when cleaning surfaces the patient has come in contact with. Use a diluted bleach solution (e.g., dilute bleach with 1 part bleach and 10 parts water) or a household disinfectant with a label that says EPA-registered for coronaviruses. To make a bleach solution at home, add 1 tablespoon of bleach to 1 quart (4 cups) of water. For a larger supply, add  cup of bleach to 1 gallon (16 cups) of water. Read labels of cleaning products and follow recommendations provided on product labels. Labels contain instructions for safe and effective use of the cleaning product including precautions you should take when applying the product, such as wearing gloves or eye protection and making sure you have good ventilation during use of the product. Remove gloves and wash hands immediately after cleaning.  Monitor yourself for signs and symptoms of illness Caregivers and household members are considered close contacts, should monitor their health, and will be asked to limit movement outside of the home to the extent possible. Follow the monitoring steps for close contacts listed on the symptom monitoring form.   ? If you have additional questions, contact your local health department or call the epidemiologist on call at [phone] (available 24/7). ? This guidance is subject to change. For the most up-to-date guidance from [facility], please refer to their website: YouBlogs.pl   Increase activity slowly   Complete by: As directed      Allergies as of 09/08/2019      Reactions   Cephalexin Rash   Levofloxacin Rash       Medication List    TAKE these medications   amLODipine 5 MG tablet Commonly known as: NORVASC Take 1 tablet (5 mg total) by mouth daily.   aspirin 81 MG EC tablet Take 1 tablet (81 mg total) by mouth daily.   atorvastatin 40 MG tablet Commonly known as: LIPITOR Take 40 mg by mouth daily at 6 PM.   clopidogrel 75 MG tablet Commonly known as: PLAVIX Take 1 tablet (75 mg total) by mouth daily. Patient will be scheduled for AUGUST 2020. What changed: additional instructions   dexamethasone 6 MG tablet Commonly known as: DECADRON Take 1 tablet (  6 mg total) by mouth daily for 5 days.   escitalopram 10 MG tablet Commonly known as: LEXAPRO Take 1 tablet by mouth once daily   guaiFENesin 600 MG 12 hr tablet Commonly known as: MUCINEX Take 1 tablet (600 mg total) by mouth 2 (two) times daily.   lactose free nutrition Liqd Take 237 mLs by mouth 2 (two) times daily between meals.   loratadine 10 MG tablet Commonly known as: CLARITIN Take 1 tablet (10 mg total) by mouth daily as needed for allergies.   Melatonin 3 MG Tabs Take 3 mg by mouth at bedtime.   memantine 7 MG Cp24 24 hr capsule Commonly known as: NAMENDA XR TAKE 1  BY MOUTH ONCE DAILY What changed: See the new instructions.   nitroGLYCERIN 0.4 MG SL tablet Commonly known as: Nitrostat Place 1 tablet (0.4 mg total) under the tongue every 5 (five) minutes as needed. What changed:   when to take this  reasons to take this   pantoprazole 40 MG tablet Commonly known as: PROTONIX Take 1 tablet (40 mg total) by mouth daily. Patient will be scheduled for AUGUST 2020. What changed: additional instructions   polyethylene glycol powder 17 GM/SCOOP powder Commonly known as: GLYCOLAX/MIRALAX Take 17 g by mouth daily. MIX INTO 4-8 OUNCES OF LIQUID   PRESCRIPTION MEDICATION See admin instructions. CPAP- At bedtime (remove each morning)   senna-docusate 8.6-50 MG tablet Commonly known as: Senokot-S Take 1 tablet  by mouth 2 (two) times daily as needed for mild constipation. What changed: when to take this   vitamin B-12 1000 MCG tablet Commonly known as: CYANOCOBALAMIN Take 1,000 mcg by mouth daily.   Vitamin D3 25 MCG tablet Commonly known as: Vitamin D Take 1 tablet (1,000 Units total) by mouth daily.      Allergies  Allergen Reactions  . Cephalexin Rash  . Levofloxacin Rash      The results of significant diagnostics from this hospitalization (including imaging, microbiology, ancillary and laboratory) are listed below for reference.    Significant Diagnostic Studies: CT ANGIO CHEST PE W OR WO CONTRAST  Result Date: 09/01/2019 CLINICAL DATA:  Hypoxemia EXAM: CT ANGIOGRAPHY CHEST WITH CONTRAST TECHNIQUE: Multidetector CT imaging of the chest was performed using the standard protocol during bolus administration of intravenous contrast. Multiplanar CT image reconstructions and MIPs were obtained to evaluate the vascular anatomy. CONTRAST:  163mL OMNIPAQUE IOHEXOL 350 MG/ML SOLN COMPARISON:  CT angiography 06/25/2013, chest radiograph 08/31/2019 FINDINGS: Cardiovascular: Satisfactory opacification of the pulmonary arteries to the segmental level. No acute or chronic pulmonary arterial filling defects are identified with resolution of the extensive bilateral emboli seen on comparison from 2014. Central pulmonary arteries are normal caliber. There is a trace pericardial effusion. Cardiac size is within normal limits. Coronary artery calcifications are noted. Ascending thoracic aorta is mildly dilated and measures up to 4.2 cm in diameter. Atherosclerotic plaque seen through the thoracic aorta and proximal great vessels with normal 3 vessel branching of the aortic arch. Mediastinum/Nodes: Few scattered low-attenuation lymph nodes are present in the mediastinum and hila. Index lymph node in the subcarinal region measures to 12 mm not significantly changed from comparison study. An additional enlarged 10  mm right precarinal lymph node is unchanged from prior as well. No axillary adenopathy. No acute abnormality of the thyroid gland and thoracic inlet. Patient is intubated at the time of exam with the endotracheal tube positioned 3.8 cm from the carina. A transesophageal tube tip is in place with the tip  terminating in the gastric body and the side port positioned near the GE junction. Lungs/Pleura: Dense consolidative opacities present in the lung bases with associated volume loss. There are some airways thickening and scattered secretions with several fluid-filled airways in the regions of consolidation as well as some patchy parenchymal hypoattenuation suggesting underlying pneumonia. Additional patchy areas of airspace disease are seen in the superior segments of both lower lobes in the posterior segment of the right upper lobe and within the lingula. Trace left effusion is noted. Scattered calcified granulomata noted within the lung parenchyma. Upper Abdomen: Patient is post cholecystectomy with surgical clips in the gallbladder fossa. Mild bilateral symmetric perinephric stranding, a nonspecific finding though may correlate with either age or decreased renal function. Partial fatty replacement of the pancreas. Musculoskeletal: Multilevel degenerative changes are present in the imaged portions of the spine. Multilevel flowing anterior osteophytosis, compatible with features of diffuse idiopathic skeletal hyperostosis (DISH). Indeterminate sclerotic lesion seen in the T12 level, new from comparison study. Review of the MIP images confirms the above findings. IMPRESSION: 1. No evidence of acute or chronic pulmonary embolus. 2. Dense consolidative opacities in the lung bases with fluid-filled airways and some associated volume loss. Findings are compatible with pneumonia and/or aspiration. Additional areas of patchy nodular airspace disease are seen in the upper lobes and lingula as well. Recommend 6-8 week  follow-up CT to ensure resolution. 3. Trace left pleural effusion and pericardial effusion. 4. Coronary artery disease.  Aortic Atherosclerosis (ICD10-I70.0). 5. Indeterminate sclerotic lesion in the T12 level, new from comparison study. If there is concern for osseous metastatic disease, further evaluation with nonemergent outpatient bone scan may be useful. 6. Additional chronic and incidental findings as detailed above. 7. Mild aneurysmal dilatation of the ascending thoracic aorta, measuring up to 4.2 cm in diameter. Recommend annual imaging followup by CTA or MRA. This recommendation follows 2010 ACCF/AHA/AATS/ACR/ASA/SCA/SCAI/SIR/STS/SVM Guidelines for the Diagnosis and Management of Patients with Thoracic Aortic Disease. Circulation. 2010; [id number]. Aortic aneurysm NOS (ICD10-I71.9) 8. Mild bilateral symmetric perinephric stranding, a nonspecific finding though may correlate with either age or decreased renal function. If there is concern for infection, correlation with urinalysis may be useful. Electronically Signed   By: Lovena Le M.D.   On: 09/01/2019 03:49   DG Chest Port 1 View  Result Date: 09/02/2019 CLINICAL DATA:  Respiratory failure.  COVID pneumonia. EXAM: PORTABLE CHEST 1 VIEW COMPARISON:  Chest x-ray 08/31/2019 FINDINGS: The endotracheal tube is 6 cm above the carina. The NG tube is coursing down the esophagus and into the stomach. Persistent bibasilar infiltrates but improved aeration since prior film. Possible small left effusion. IMPRESSION: 1. Stable support apparatus. 2. Persistent bibasilar infiltrates but slight improved overall aeration. Electronically Signed   By: Marijo Sanes M.D.   On: 09/02/2019 05:54   DG Chest Port 1 View  Result Date: 08/31/2019 CLINICAL DATA:  Post intubation EXAM: PORTABLE CHEST 1 VIEW COMPARISON:  Radiograph 03/15/2019 FINDINGS: Endotracheal tube terminates in the mid trachea approximately 5 cm from the level of the carina. A transesophageal  tube tip and side port are below the level of imaging, at or below the GE junction. Telemetry leads overlie the chest. There are dense patchy perihilar and basilar airspace opacities with mixed hazy interstitial opacity more pronounced in the right lung than left. No visible pneumothorax or effusion the portion of the costophrenic sulci are collimated. Included cardiomediastinal contours are unremarkable. No acute osseous or soft tissue abnormality. Degenerative changes are present in the  imaged spine and shoulders. IMPRESSION: 1. Endotracheal tube 5 cm from the level of the carina. 2. Transesophageal tip and side port below the level of imaging, at or below the GE junction. 3. Dense patchy perihilar and basilar airspace opacities with mixed hazy interstitial opacity. Findings may reflect multifocal pneumonia or edema. Electronically Signed   By: Lovena Le M.D.   On: 08/31/2019 23:06    Microbiology: Recent Results (from the past 240 hour(s))  Respiratory Panel by RT PCR (Flu A&B, Covid) - Nasopharyngeal Swab     Status: Abnormal   Collection Time: 08/31/19 10:48 PM   Specimen: Nasopharyngeal Swab  Result Value Ref Range Status   SARS Coronavirus 2 by RT PCR POSITIVE (A) NEGATIVE Final    Comment: RESULT CALLED TO, READ BACK BY AND VERIFIED WITH: RN ANDREW CAIN AT 0038 BY [place] ON 09/01/2019 (NOTE) SARS-CoV-2 target nucleic acids are DETECTED. SARS-CoV-2 RNA is generally detectable in upper respiratory specimens  during the acute phase of infection. Positive results are indicative of the presence of the identified virus, but do not rule out bacterial infection or co-infection with other pathogens not detected by the test. Clinical correlation with patient history and other diagnostic information is necessary to determine patient infection status. The expected result is Negative. Fact Sheet for Patients:  PinkCheek.be Fact Sheet for Healthcare  Providers: GravelBags.it This test is not yet approved or cleared by the [place] FDA and  has been authorized for detection and/or diagnosis of SARS-CoV-2 by FDA under an Emergency Use Authorization (EUA).  This EUA will remain in effect (meaning th is test can be used) for the duration of  the COVID-19 declaration under Section 564(b)(1) of the Act, 21 U.S.C. section 360bbb-3(b)(1), unless the authorization is terminated or revoked sooner.    Influenza A by PCR NEGATIVE NEGATIVE Final   Influenza B by PCR NEGATIVE NEGATIVE Final    Comment: (NOTE) The Xpert Xpress SARS-CoV-2/FLU/RSV assay is intended as an aid in  the diagnosis of influenza from Nasopharyngeal swab specimens and  should not be used as a sole basis for treatment. Nasal washings and  aspirates are unacceptable for Xpert Xpress SARS-CoV-2/FLU/RSV  testing. Fact Sheet for Patients: PinkCheek.be Fact Sheet for Healthcare Providers: GravelBags.it This test is not yet approved or cleared by the [place] FDA and  has been authorized for detection and/or diagnosis of SARS-CoV-2 by  FDA under an Emergency Use Authorization (EUA). This EUA will remain  in effect (meaning this test can be used) for the duration of the  Covid-19 declaration under Section 564(b)(1) of the Act, 21  U.S.C. section 360bbb-3(b)(1), unless the authorization is  terminated or revoked. Performed at [facility], [place] [address], [place], [place] [address]   Culture, blood (Routine x 2)     Status: None   Collection Time: 08/31/19 10:55 PM   Specimen: BLOOD LEFT WRIST  Result Value Ref Range Status   Specimen Description BLOOD LEFT WRIST  Final   Special Requests   Final    BOTTLES DRAWN AEROBIC AND ANAEROBIC Blood Culture adequate volume   Culture   Final    NO GROWTH 5 DAYS Performed at [facility], [place] [address] [place], [place]  [address]    Report Status 09/05/2019 FINAL  Final  Culture, blood (Routine x 2)     Status: None   Collection Time: 08/31/19 11:30 PM   Specimen: BLOOD  Result Value Ref Range Status   Specimen  Description BLOOD RIGHT ANTECUBITAL  Final   Special Requests   Final    BOTTLES DRAWN AEROBIC AND ANAEROBIC Blood Culture adequate volume   Culture   Final    NO GROWTH 5 DAYS Performed at [facility], [address], [place], [place] [address]    Report Status 09/05/2019 FINAL  Final  Urine culture     Status: None   Collection Time: 09/01/19 12:24 AM   Specimen: In/Out Cath Urine  Result Value Ref Range Status   Specimen Description IN/OUT CATH URINE  Final   Special Requests NONE  Final   Culture   Final    NO GROWTH Performed at [facility], [place] [address], [place], [place] [address]    Report Status 09/02/2019 FINAL  Final  Culture, respiratory (non-expectorated)     Status: None   Collection Time: 09/01/19  4:51 AM   Specimen: Tracheal Aspirate; Respiratory  Result Value Ref Range Status   Specimen Description TRACHEAL ASPIRATE  Final   Special Requests NONE  Final   Gram Stain   Final    RARE WBC PRESENT, PREDOMINANTLY PMN RARE GRAM POSITIVE COCCI IN PAIRS RARE BUDDING YEAST SEEN    Culture   Final    FEW Consistent with normal respiratory flora. Performed at [facility], [place] [address] [place], [place] [address]    Report Status 09/03/2019 FINAL  Final  MRSA PCR Screening     Status: None   Collection Time: 09/01/19  4:51 AM   Specimen: Nasopharyngeal  Result Value Ref Range Status   MRSA by PCR NEGATIVE NEGATIVE Final    Comment:        The GeneXpert MRSA Assay (FDA approved for NASAL specimens only), is one component of a comprehensive MRSA colonization surveillance program. It is not intended to diagnose MRSA infection nor to guide or monitor treatment for MRSA infections. Performed at [facility], [place] [address], [place],  [place] [address]      Labs: Basic Metabolic Panel: Recent Labs  Lab 09/01/19 1200 09/01/19 1700 09/02/19 0500 09/02/19 1637 09/03/19 0500 09/04/19 0500 09/06/19 0310 09/07/19 0500  NA  --   --  139  --  141 141 142 143  K  --   --  4.0  --  3.0* 3.4* 2.9* 3.4*  CL  --   --  102  --  100 102 106 106  CO2  --   --  24  --  28 27 26 27   GLUCOSE  --   --  170*  --  92 110* 212* 147*  BUN  --   --  33*  --  17 15 26* 23  CREATININE  --   --  1.18  --  0.69 0.88 0.77 0.66  CALCIUM  --   --  8.9  --  8.6* 8.7* 8.5* 8.7*  MG 2.2 2.1 2.2 2.2  --   --   --   --   PHOS 4.8* 3.8 3.6 2.1*  --   --   --   --    Liver Function Tests: Recent Labs  Lab 09/02/19 0500 09/03/19 0500 09/04/19 0500  AST 29 37 35  ALT 29 31 34  ALKPHOS 60 74 73  BILITOT 0.6 0.8 0.8  PROT 6.7 7.0 6.6  ALBUMIN 2.8* 3.0* 2.9*   No results for input(s): LIPASE, AMYLASE in the last 168 hours. No results for input(s): AMMONIA in  the last 168 hours. CBC: Recent Labs  Lab 09/02/19 0500 09/03/19 0500 09/04/19 0500 09/05/19 0619  WBC 20.4* 19.1* 14.5* 9.7  HGB 14.7 15.5 15.4 13.9  HCT 44.0 45.6 44.7 40.9  MCV 95.4 93.6 92.9 93.0  PLT 178 195 197 187   Cardiac Enzymes: No results for input(s): CKTOTAL, CKMB, CKMBINDEX, TROPONINI in the last 168 hours. BNP: BNP (last 3 results) Recent Labs    03/04/19 0455 08/31/19 2258  BNP 124.0* 91.7    ProBNP (last 3 results) No results for input(s): PROBNP in the last 8760 hours.  CBG: Recent Labs  Lab 09/07/19 1701 09/07/19 2041 09/08/19 0051 09/08/19 0429 09/08/19 0759  GLUCAP 131* 149* 140* 111* 128*       Signed:  Kayleen Memos, MD [facility] 09/08/2019, 10:23 AM

## 2019-09-08 NOTE — Progress Notes (Signed)
Attempted to call patient's son [name] to update him but he had a poor connection. He will try to call back later.

## 2019-09-09 ENCOUNTER — Other Ambulatory Visit: Payer: Self-pay

## 2019-09-09 ENCOUNTER — Encounter (HOSPITAL_COMMUNITY): Payer: Self-pay | Admitting: Pulmonary Disease

## 2019-09-09 LAB — GLUCOSE, CAPILLARY
Glucose-Capillary: 141 mg/dL — ABNORMAL HIGH (ref 70–99)
Glucose-Capillary: 141 mg/dL — ABNORMAL HIGH (ref 70–99)
Glucose-Capillary: 143 mg/dL — ABNORMAL HIGH (ref 70–99)
Glucose-Capillary: 157 mg/dL — ABNORMAL HIGH (ref 70–99)
Glucose-Capillary: 175 mg/dL — ABNORMAL HIGH (ref 70–99)
Glucose-Capillary: 196 mg/dL — ABNORMAL HIGH (ref 70–99)

## 2019-09-09 MED ORDER — DEXAMETHASONE 6 MG PO TABS: 6.0000 mg | ORAL_TABLET | Freq: Every day | ORAL | 0 refills | Status: DC

## 2019-09-09 MED ORDER — DEXAMETHASONE 6 MG PO TABS: 6.0000 mg | ORAL_TABLET | Freq: Every day | ORAL | 0 refills | Status: AC

## 2019-09-09 NOTE — Care Management Important Message (Signed)
Important Message  Patient Details  Name: MATTHEO FREEHILL MRN: [id number] Date of Birth: 1946-01-12   Medicare Important Message Given:  Yes - Important Message mailed due to current National Emergency  Verbal consent obtained due to current National Emergency  Relationship to patient: Child Contact Name: Zephyrus Marcinek Call Date: 09/09/19  Time: 1244 Phone: [phone] Outcome: Spoke with contact Important Message mailed to: Patient address on file    Delorse Lek 09/09/2019, 12:44 PM

## 2019-09-09 NOTE — TOC Progression Note (Addendum)
Transition of Care [facility]) - Progression Note    Patient Details  Name: John Blackburn MRN: [id number] Date of Birth: Sep 03, 1946  Transition of Care [facility] CM/SW [place], LCSW Phone Number: 09/09/2019, 11:52 AM  Clinical Narrative:    11:52am-CSW in touch with [facility] regarding transportation. Per Chantel, patient will be going to [facility] in [place].   1:39pm-[facility] reporting that [facility] does not have beds available. They are in communication with [facility] to see if they can accept patient.   2:35pm-[facility] going to accept patient, per Freda Munro. Waiting on confirmation to schedule PTAR.    Expected Discharge Plan: [place] Barriers to Discharge: Continued Medical Work up, Ship broker  Expected Discharge Plan and Services Expected Discharge Plan: [place] Choice: [place] arrangements for the past 2 months: [place] Expected Discharge Date: 09/09/19                                     Social Determinants of Health (SDOH) Interventions    Readmission Risk Interventions No flowsheet data found.

## 2019-09-09 NOTE — Plan of Care (Signed)
  Problem: Education: Goal: Knowledge of General Education information will improve Description: Including pain rating scale, medication(s)/side effects and non-pharmacologic comfort measures Outcome: Not Progressing   Problem: Health Behavior/Discharge Planning: Goal: Ability to manage health-related needs will improve Outcome: Not Progressing   Problem: Clinical Measurements: Goal: Ability to maintain clinical measurements within normal limits will improve Outcome: Not Progressing Goal: Will remain free from infection Outcome: Not Progressing Goal: Diagnostic test results will improve Outcome: Not Progressing Goal: Respiratory complications will improve Outcome: Not Progressing Goal: Cardiovascular complication will be avoided Outcome: Not Progressing   Problem: Activity: Goal: Risk for activity intolerance will decrease Outcome: Not Progressing   Problem: Nutrition: Goal: Adequate nutrition will be maintained Outcome: Not Progressing   Problem: Coping: Goal: Level of anxiety will decrease Outcome: Not Progressing   Problem: Elimination: Goal: Will not experience complications related to bowel motility Outcome: Not Progressing Goal: Will not experience complications related to urinary retention Outcome: Not Progressing   Problem: Pain Managment: Goal: General experience of comfort will improve Outcome: Not Progressing   Problem: Safety: Goal: Ability to remain free from injury will improve Outcome: Not Progressing   Problem: Skin Integrity: Goal: Risk for impaired skin integrity will decrease Outcome: Not Progressing   Problem: Education: Goal: Knowledge of General Education information will improve Description: Including pain rating scale, medication(s)/side effects and non-pharmacologic comfort measures Outcome: Not Progressing   Problem: Health Behavior/Discharge Planning: Goal: Ability to manage health-related needs will improve Outcome: Not  Progressing   Problem: Clinical Measurements: Goal: Ability to maintain clinical measurements within normal limits will improve Outcome: Not Progressing Goal: Will remain free from infection Outcome: Not Progressing Goal: Diagnostic test results will improve Outcome: Not Progressing Goal: Respiratory complications will improve Outcome: Not Progressing Goal: Cardiovascular complication will be avoided Outcome: Not Progressing   Problem: Activity: Goal: Risk for activity intolerance will decrease Outcome: Not Progressing   Problem: Nutrition: Goal: Adequate nutrition will be maintained Outcome: Not Progressing   Problem: Coping: Goal: Level of anxiety will decrease Outcome: Not Progressing   Problem: Elimination: Goal: Will not experience complications related to bowel motility Outcome: Not Progressing Goal: Will not experience complications related to urinary retention Outcome: Not Progressing   Problem: Pain Managment: Goal: General experience of comfort will improve Outcome: Not Progressing   Problem: Safety: Goal: Ability to remain free from injury will improve Outcome: Not Progressing   Problem: Skin Integrity: Goal: Risk for impaired skin integrity will decrease Outcome: Not Progressing   

## 2019-09-09 NOTE — Discharge Summary (Signed)
Discharge Summary  John Blackburn [id number] DOB: 1946-02-11  PCP: Hoyt Koch, MD  Admit date: 08/31/2019 Discharge date: 09/09/2019  Time spent: 35 minutes   Recommendations for Outpatient Follow-up:  1. Quarantine till September 21, 2019. 2. Decadron 6 mg p.o. daily for 3 more days, stop after 09/12/2019. 3. Check BMP in 3 days   Discharge Diagnoses:  Active Hospital Problems   Diagnosis Date Noted  . Respiratory failure ([place]) 09/01/2019  . COVID-19 09/01/2019    Resolved Hospital Problems  No resolved problems to display.    Discharge Condition: Stable.  Diet recommendation: Resume previous diet.  Vitals:   09/09/19 0353 09/09/19 0840  BP: (!) 138/96 137/87  Pulse: 69 68  Resp: (!) 21 20  Temp: 98.1 F (36.7 C) 98 F (36.7 C)  SpO2: 96% 95%    History of present illness:  History of present illness:  74 year old male with a history of hypertension, hyperlipidemia, GERD, depression, CAD s/p stent placement, PE not on anticoagulation, diastolic CHF, OSA, memory loss was brought to ED after he had episode of nausea vomiting and dyspnea. Patient developed altered mental status and hypoxia in the ED required intubation, was transferred to ICU. He was found to be COVID-19 positive. Patient was extubated on 09/02/2019. [facility] assumed care on 09/04/2019.  Completed course of remdesivir.  Also completed 7 days of IV Zosyn for superimposed bacterial pneumonia.  09/09/19: Seen and examined.  No acute events overnight.  He has no new complaints.  Vital signs and labs reviewed and are stable.  On the day of discharge, the patient was hemodynamically stable.  He will need to take his medications as prescribed and follow-up with his primary care provider posthospitalization.   Hospital Course:  Active Problems:   Respiratory failure ([place])   COVID-19  1. Resolved acute hypoxic respiratory failure-secondary to aspiration pneumonia and Covid pneumonia. Patient was  treated with remdesivir for 5 days. Also started on Decadron 6 mg daily. Will discharge on Decadron 6 mg daily for 5 more days, stop after 09/12/2019.  He was also treated with IV Zosyn for 7 days.  Last CRP was 2.5 on 08/31/2019.Patient O2 sats currently is 94% on room air., CRP 8.1, ferritin 362.  Repeat Covid labs on 09/06/2019.  CRP trending down.  COVID-19 Labs  Recent Labs (last 2 labs)     6.  Recent Labs 6.    6. 09/06/19 7. 0310 6.  DDIMER 6. 1.16* 6.  CRP 6. 3.5*    6.  7.  Recent Labs      6.  Lab Results 6.  Component 6. Value 6. Date 6.   6. [place] 6. POSITIVE (A) 6. 08/31/2019 6.   6. [place] 6. NEGATIVE 6. 03/21/2019 6.   6. [place] 6. NOT DETECTED 6. 03/11/2019    2.   2. Metabolic encephalopathy-secondary to hypoxic respiratory failure, he has memory loss. He slowly improving, patient has baseline dementia. Continue Namenda.  3. History of CAD s/p stent placement, chronic diastolic CHF-continue aspirin, Plavix, Lipitor.  4. Steroid-induced hyperglycemia-patient was treated with sliding scale insulin with NovoLog.  5. Hypokalemia-potassium was 3.4 this morning, will give K. Dur 20 mEq p.o. x1. Blood glucose is stable. He will stop Decadron after 09/12/2019.   Procedures:  ETT from 08/31/2019 to 09/02/2019  Consultations:  PCCM    Discharge Exam: BP 137/87 (BP Location: Right Arm)   Pulse 68   Temp 98 F (36.7 C) (Oral)   Resp 20  Ht 5\' 11"  (1.803 m)   Wt 98.5 kg   SpO2 95%   BMI 30.29 kg/m  . General: 74 y.o. year-old male well developed well nourished in no acute distress.  Alert and oriented x3. . Cardiovascular: Regular rate and rhythm with no rubs or gallops.  No thyromegaly or JVD noted.   [name] Respiratory: Clear to auscultation.  No wheezing noted.  Good inspiratory effort. . Abdomen: Soft nontender nondistended with normal bowel sounds x4 quadrants. . Musculoskeletal: Trace lower extremity edema.  [name] Psychiatry:  Mood is appropriate for condition and setting  Discharge Instructions You were cared for by a hospitalist during your hospital stay. If you have any questions about your discharge medications or the care you received while you were in the hospital after you are discharged, you can call the unit and asked to speak with the hospitalist on call if the hospitalist that took care of you is not available. Once you are discharged, your primary care physician will handle any further medical issues. Please note that NO REFILLS for any discharge medications will be authorized once you are discharged, as it is imperative that you return to your primary care physician (or establish a relationship with a primary care physician if you do not have one) for your aftercare needs so that they can reassess your need for medications and monitor your lab values.  Discharge Instructions    Diet - low sodium heart healthy   Complete by: As directed    Discharge instructions   Complete by: As directed    Quarantine till 09/21/2019.  Person Under Monitoring Name: John Blackburn  Location: [place] [address]   Infection Prevention Recommendations for Individuals Confirmed to have, or Being Evaluated for, 2019 Novel Coronavirus (COVID-19) Infection Who Receive Care at Home  Individuals who are confirmed to have, or are being evaluated for, COVID-19 should follow the prevention steps below until a healthcare provider or local or state health department says they can return to normal activities.  Stay home except to get medical care You should restrict activities outside your home, except for getting medical care. Do not go to work, school, or public areas, and do not use public transportation or taxis.  Call ahead before visiting your doctor Before your medical appointment, call the healthcare provider and tell them that you have, or are being evaluated for, COVID-19 infection. This will help  the healthcare provider's office take steps to keep other people from getting infected. Ask your healthcare provider to call the local or state health department.  Monitor your symptoms Seek prompt medical attention if your illness is worsening (e.g., difficulty breathing). Before going to your medical appointment, call the healthcare provider and tell them that you have, or are being evaluated for, COVID-19 infection. Ask your healthcare provider to call the local or state health department.  Wear a facemask You should wear a facemask that covers your nose and mouth when you are in the same room with other people and when you visit a healthcare provider. People who live with or visit you should also wear a facemask while they are in the same room with you.  Separate yourself from other people in your home As much as possible, you should stay in a different room from other people in your home. Also, you should use a separate bathroom, if available.  Avoid sharing household items You should not share dishes, drinking glasses, cups, eating utensils, towels, bedding, or other  items with other people in your home. After using these items, you should wash them thoroughly with soap and water.  Cover your coughs and sneezes Cover your mouth and nose with a tissue when you cough or sneeze, or you can cough or sneeze into your sleeve. Throw used tissues in a lined trash can, and immediately wash your hands with soap and water for at least 20 seconds or use an alcohol-based hand rub.  Wash your [facility] your hands often and thoroughly with soap and water for at least 20 seconds. You can use an alcohol-based hand sanitizer if soap and water are not available and if your hands are not visibly dirty. Avoid touching your eyes, nose, and mouth with unwashed hands.   Prevention Steps for Caregivers and Household Members of Individuals Confirmed to have, or Being Evaluated for, COVID-19 Infection  Being Cared for in the Home  If you live with, or provide care at home for, a person confirmed to have, or being evaluated for, COVID-19 infection please follow these guidelines to prevent infection:  Follow healthcare provider's instructions Make sure that you understand and can help the patient follow any healthcare provider instructions for all care.  Provide for the patient's basic needs You should help the patient with basic needs in the home and provide support for getting groceries, prescriptions, and other personal needs.  Monitor the patient's symptoms If they are getting sicker, call his or her medical provider and tell them that the patient has, or is being evaluated for, COVID-19 infection. This will help the healthcare provider's office take steps to keep other people from getting infected. Ask the healthcare provider to call the local or state health department.  Limit the number of people who have contact with the patient If possible, have only one caregiver for the patient. Other household members should stay in another home or place of residence. If this is not possible, they should stay in another room, or be separated from the patient as much as possible. Use a separate bathroom, if available. Restrict visitors who do not have an essential need to be in the home.  Keep older adults, very young children, and other sick people away from the patient Keep older adults, very young children, and those who have compromised immune systems or chronic health conditions away from the patient. This includes people with chronic heart, lung, or kidney conditions, diabetes, and cancer.  Ensure good ventilation Make sure that shared spaces in the home have good air flow, such as from an air conditioner or an opened window, weather permitting.  Wash your hands often Wash your hands often and thoroughly with soap and water for at least 20 seconds. You can use an alcohol based hand  sanitizer if soap and water are not available and if your hands are not visibly dirty. Avoid touching your eyes, nose, and mouth with unwashed hands. Use disposable paper towels to dry your hands. If not available, use dedicated cloth towels and replace them when they become wet.  Wear a facemask and gloves Wear a disposable facemask at all times in the room and gloves when you touch or have contact with the patient's blood, body fluids, and/or secretions or excretions, such as sweat, saliva, sputum, nasal mucus, vomit, urine, or feces.  Ensure the mask fits over your nose and mouth tightly, and do not touch it during use. Throw out disposable facemasks and gloves after using them. Do not reuse. Wash your hands immediately after  removing your facemask and gloves. If your personal clothing becomes contaminated, carefully remove clothing and launder. Wash your hands after handling contaminated clothing. Place all used disposable facemasks, gloves, and other waste in a lined container before disposing them with other household waste. Remove gloves and wash your hands immediately after handling these items.  Do not share dishes, glasses, or other household items with the patient Avoid sharing household items. You should not share dishes, drinking glasses, cups, eating utensils, towels, bedding, or other items with a patient who is confirmed to have, or being evaluated for, COVID-19 infection. After the person uses these items, you should wash them thoroughly with soap and water.  Wash laundry thoroughly Immediately remove and wash clothes or bedding that have blood, body fluids, and/or secretions or excretions, such as sweat, saliva, sputum, nasal mucus, vomit, urine, or feces, on them. Wear gloves when handling laundry from the patient. Read and follow directions on labels of laundry or clothing items and detergent. In general, wash and dry with the warmest temperatures recommended on the  label.  Clean all areas the individual has used often Clean all touchable surfaces, such as counters, tabletops, doorknobs, bathroom fixtures, toilets, phones, keyboards, tablets, and bedside tables, every day. Also, clean any surfaces that may have blood, body fluids, and/or secretions or excretions on them. Wear gloves when cleaning surfaces the patient has come in contact with. Use a diluted bleach solution (e.g., dilute bleach with 1 part bleach and 10 parts water) or a household disinfectant with a label that says EPA-registered for coronaviruses. To make a bleach solution at home, add 1 tablespoon of bleach to 1 quart (4 cups) of water. For a larger supply, add  cup of bleach to 1 gallon (16 cups) of water. Read labels of cleaning products and follow recommendations provided on product labels. Labels contain instructions for safe and effective use of the cleaning product including precautions you should take when applying the product, such as wearing gloves or eye protection and making sure you have good ventilation during use of the product. Remove gloves and wash hands immediately after cleaning.  Monitor yourself for signs and symptoms of illness Caregivers and household members are considered close contacts, should monitor their health, and will be asked to limit movement outside of the home to the extent possible. Follow the monitoring steps for close contacts listed on the symptom monitoring form.   ? If you have additional questions, contact your local health department or call the epidemiologist on call at [phone] (available 24/7). ? This guidance is subject to change. For the most up-to-date guidance from [facility], please refer to their website: YouBlogs.pl   Increase activity slowly   Complete by: As directed      Allergies as of 09/09/2019      Reactions   Cephalexin Rash   Levofloxacin Rash      Medication  List    TAKE these medications   amLODipine 5 MG tablet Commonly known as: NORVASC Take 1 tablet (5 mg total) by mouth daily.   aspirin 81 MG EC tablet Take 1 tablet (81 mg total) by mouth daily.   atorvastatin 40 MG tablet Commonly known as: LIPITOR Take 40 mg by mouth daily at 6 PM.   clopidogrel 75 MG tablet Commonly known as: PLAVIX Take 1 tablet (75 mg total) by mouth daily. Patient will be scheduled for AUGUST 2020. What changed: additional instructions   dexamethasone 6 MG tablet Commonly known as: DECADRON Take 1 tablet (  6 mg total) by mouth daily for 3 days.   escitalopram 10 MG tablet Commonly known as: LEXAPRO Take 1 tablet by mouth once daily   guaiFENesin 600 MG 12 hr tablet Commonly known as: MUCINEX Take 1 tablet (600 mg total) by mouth 2 (two) times daily.   lactose free nutrition Liqd Take 237 mLs by mouth 2 (two) times daily between meals.   loratadine 10 MG tablet Commonly known as: CLARITIN Take 1 tablet (10 mg total) by mouth daily as needed for allergies.   Melatonin 3 MG Tabs Take 3 mg by mouth at bedtime.   memantine 7 MG Cp24 24 hr capsule Commonly known as: NAMENDA XR TAKE 1  BY MOUTH ONCE DAILY What changed: See the new instructions.   nitroGLYCERIN 0.4 MG SL tablet Commonly known as: Nitrostat Place 1 tablet (0.4 mg total) under the tongue every 5 (five) minutes as needed. What changed:   when to take this  reasons to take this   pantoprazole 40 MG tablet Commonly known as: PROTONIX Take 1 tablet (40 mg total) by mouth daily. Patient will be scheduled for AUGUST 2020. What changed: additional instructions   polyethylene glycol powder 17 GM/SCOOP powder Commonly known as: GLYCOLAX/MIRALAX Take 17 g by mouth daily. MIX INTO 4-8 OUNCES OF LIQUID   PRESCRIPTION MEDICATION See admin instructions. CPAP- At bedtime (remove each morning)   senna-docusate 8.6-50 MG tablet Commonly known as: Senokot-S Take 1 tablet by mouth 2  (two) times daily as needed for mild constipation. What changed: when to take this   vitamin B-12 1000 MCG tablet Commonly known as: CYANOCOBALAMIN Take 1,000 mcg by mouth daily.   Vitamin D3 25 MCG tablet Commonly known as: Vitamin D Take 1 tablet (1,000 Units total) by mouth daily.      Allergies  Allergen Reactions  . Cephalexin Rash  . Levofloxacin Rash      The results of significant diagnostics from this hospitalization (including imaging, microbiology, ancillary and laboratory) are listed below for reference.    Significant Diagnostic Studies: CT ANGIO CHEST PE W OR WO CONTRAST  Result Date: 09/01/2019 CLINICAL DATA:  Hypoxemia EXAM: CT ANGIOGRAPHY CHEST WITH CONTRAST TECHNIQUE: Multidetector CT imaging of the chest was performed using the standard protocol during bolus administration of intravenous contrast. Multiplanar CT image reconstructions and MIPs were obtained to evaluate the vascular anatomy. CONTRAST:  124mL OMNIPAQUE IOHEXOL 350 MG/ML SOLN COMPARISON:  CT angiography 06/25/2013, chest radiograph 08/31/2019 FINDINGS: Cardiovascular: Satisfactory opacification of the pulmonary arteries to the segmental level. No acute or chronic pulmonary arterial filling defects are identified with resolution of the extensive bilateral emboli seen on comparison from 2014. Central pulmonary arteries are normal caliber. There is a trace pericardial effusion. Cardiac size is within normal limits. Coronary artery calcifications are noted. Ascending thoracic aorta is mildly dilated and measures up to 4.2 cm in diameter. Atherosclerotic plaque seen through the thoracic aorta and proximal great vessels with normal 3 vessel branching of the aortic arch. Mediastinum/Nodes: Few scattered low-attenuation lymph nodes are present in the mediastinum and hila. Index lymph node in the subcarinal region measures to 12 mm not significantly changed from comparison study. An additional enlarged 10 mm right  precarinal lymph node is unchanged from prior as well. No axillary adenopathy. No acute abnormality of the thyroid gland and thoracic inlet. Patient is intubated at the time of exam with the endotracheal tube positioned 3.8 cm from the carina. A transesophageal tube tip is in place with the tip  terminating in the gastric body and the side port positioned near the GE junction. Lungs/Pleura: Dense consolidative opacities present in the lung bases with associated volume loss. There are some airways thickening and scattered secretions with several fluid-filled airways in the regions of consolidation as well as some patchy parenchymal hypoattenuation suggesting underlying pneumonia. Additional patchy areas of airspace disease are seen in the superior segments of both lower lobes in the posterior segment of the right upper lobe and within the lingula. Trace left effusion is noted. Scattered calcified granulomata noted within the lung parenchyma. Upper Abdomen: Patient is post cholecystectomy with surgical clips in the gallbladder fossa. Mild bilateral symmetric perinephric stranding, a nonspecific finding though may correlate with either age or decreased renal function. Partial fatty replacement of the pancreas. Musculoskeletal: Multilevel degenerative changes are present in the imaged portions of the spine. Multilevel flowing anterior osteophytosis, compatible with features of diffuse idiopathic skeletal hyperostosis (DISH). Indeterminate sclerotic lesion seen in the T12 level, new from comparison study. Review of the MIP images confirms the above findings. IMPRESSION: 1. No evidence of acute or chronic pulmonary embolus. 2. Dense consolidative opacities in the lung bases with fluid-filled airways and some associated volume loss. Findings are compatible with pneumonia and/or aspiration. Additional areas of patchy nodular airspace disease are seen in the upper lobes and lingula as well. Recommend 6-8 week follow-up CT to  ensure resolution. 3. Trace left pleural effusion and pericardial effusion. 4. Coronary artery disease.  Aortic Atherosclerosis (ICD10-I70.0). 5. Indeterminate sclerotic lesion in the T12 level, new from comparison study. If there is concern for osseous metastatic disease, further evaluation with nonemergent outpatient bone scan may be useful. 6. Additional chronic and incidental findings as detailed above. 7. Mild aneurysmal dilatation of the ascending thoracic aorta, measuring up to 4.2 cm in diameter. Recommend annual imaging followup by CTA or MRA. This recommendation follows 2010 ACCF/AHA/AATS/ACR/ASA/SCA/SCAI/SIR/STS/SVM Guidelines for the Diagnosis and Management of Patients with Thoracic Aortic Disease. Circulation. 2010; [id number]. Aortic aneurysm NOS (ICD10-I71.9) 8. Mild bilateral symmetric perinephric stranding, a nonspecific finding though may correlate with either age or decreased renal function. If there is concern for infection, correlation with urinalysis may be useful. Electronically Signed   By: Lovena Le M.D.   On: 09/01/2019 03:49   DG Chest Port 1 View  Result Date: 09/02/2019 CLINICAL DATA:  Respiratory failure.  COVID pneumonia. EXAM: PORTABLE CHEST 1 VIEW COMPARISON:  Chest x-ray 08/31/2019 FINDINGS: The endotracheal tube is 6 cm above the carina. The NG tube is coursing down the esophagus and into the stomach. Persistent bibasilar infiltrates but improved aeration since prior film. Possible small left effusion. IMPRESSION: 1. Stable support apparatus. 2. Persistent bibasilar infiltrates but slight improved overall aeration. Electronically Signed   By: Marijo Sanes M.D.   On: 09/02/2019 05:54   DG Chest Port 1 View  Result Date: 08/31/2019 CLINICAL DATA:  Post intubation EXAM: PORTABLE CHEST 1 VIEW COMPARISON:  Radiograph 03/15/2019 FINDINGS: Endotracheal tube terminates in the mid trachea approximately 5 cm from the level of the carina. A transesophageal tube tip and side  port are below the level of imaging, at or below the GE junction. Telemetry leads overlie the chest. There are dense patchy perihilar and basilar airspace opacities with mixed hazy interstitial opacity more pronounced in the right lung than left. No visible pneumothorax or effusion the portion of the costophrenic sulci are collimated. Included cardiomediastinal contours are unremarkable. No acute osseous or soft tissue abnormality. Degenerative changes are present in the  imaged spine and shoulders. IMPRESSION: 1. Endotracheal tube 5 cm from the level of the carina. 2. Transesophageal tip and side port below the level of imaging, at or below the GE junction. 3. Dense patchy perihilar and basilar airspace opacities with mixed hazy interstitial opacity. Findings may reflect multifocal pneumonia or edema. Electronically Signed   By: Lovena Le M.D.   On: 08/31/2019 23:06    Microbiology: Recent Results (from the past 240 hour(s))  Respiratory Panel by RT PCR (Flu A&B, Covid) - Nasopharyngeal Swab     Status: Abnormal   Collection Time: 08/31/19 10:48 PM   Specimen: Nasopharyngeal Swab  Result Value Ref Range Status   SARS Coronavirus 2 by RT PCR POSITIVE (A) NEGATIVE Final    Comment: RESULT CALLED TO, READ BACK BY AND VERIFIED WITH: RN ANDREW CAIN AT 0038 BY [place] ON 09/01/2019 (NOTE) SARS-CoV-2 target nucleic acids are DETECTED. SARS-CoV-2 RNA is generally detectable in upper respiratory specimens  during the acute phase of infection. Positive results are indicative of the presence of the identified virus, but do not rule out bacterial infection or co-infection with other pathogens not detected by the test. Clinical correlation with patient history and other diagnostic information is necessary to determine patient infection status. The expected result is Negative. Fact Sheet for Patients:  PinkCheek.be Fact Sheet for Healthcare  Providers: GravelBags.it This test is not yet approved or cleared by the [place] FDA and  has been authorized for detection and/or diagnosis of SARS-CoV-2 by FDA under an Emergency Use Authorization (EUA).  This EUA will remain in effect (meaning th is test can be used) for the duration of  the COVID-19 declaration under Section 564(b)(1) of the Act, 21 U.S.C. section 360bbb-3(b)(1), unless the authorization is terminated or revoked sooner.    Influenza A by PCR NEGATIVE NEGATIVE Final   Influenza B by PCR NEGATIVE NEGATIVE Final    Comment: (NOTE) The Xpert Xpress SARS-CoV-2/FLU/RSV assay is intended as an aid in  the diagnosis of influenza from Nasopharyngeal swab specimens and  should not be used as a sole basis for treatment. Nasal washings and  aspirates are unacceptable for Xpert Xpress SARS-CoV-2/FLU/RSV  testing. Fact Sheet for Patients: PinkCheek.be Fact Sheet for Healthcare Providers: GravelBags.it This test is not yet approved or cleared by the [place] FDA and  has been authorized for detection and/or diagnosis of SARS-CoV-2 by  FDA under an Emergency Use Authorization (EUA). This EUA will remain  in effect (meaning this test can be used) for the duration of the  Covid-19 declaration under Section 564(b)(1) of the Act, 21  U.S.C. section 360bbb-3(b)(1), unless the authorization is  terminated or revoked. Performed at [facility], [place] [address] [place], [place] [address]   Culture, blood (Routine x 2)     Status: None   Collection Time: 08/31/19 10:55 PM   Specimen: BLOOD LEFT WRIST  Result Value Ref Range Status   Specimen Description BLOOD LEFT WRIST  Final   Special Requests   Final    BOTTLES DRAWN AEROBIC AND ANAEROBIC Blood Culture adequate volume   Culture   Final    NO GROWTH 5 DAYS Performed at [facility], [place] [address], [place], [place]  [address]    Report Status 09/05/2019 FINAL  Final  Culture, blood (Routine x 2)     Status: None   Collection Time: 08/31/19 11:30 PM   Specimen: BLOOD  Result Value Ref Range Status   Specimen  Description BLOOD RIGHT ANTECUBITAL  Final   Special Requests   Final    BOTTLES DRAWN AEROBIC AND ANAEROBIC Blood Culture adequate volume   Culture   Final    NO GROWTH 5 DAYS Performed at [facility], [address], [place], [place] [address]    Report Status 09/05/2019 FINAL  Final  Urine culture     Status: None   Collection Time: 09/01/19 12:24 AM   Specimen: In/Out Cath Urine  Result Value Ref Range Status   Specimen Description IN/OUT CATH URINE  Final   Special Requests NONE  Final   Culture   Final    NO GROWTH Performed at [facility], [place] [address] [place], [place] [address]    Report Status 09/02/2019 FINAL  Final  Culture, respiratory (non-expectorated)     Status: None   Collection Time: 09/01/19  4:51 AM   Specimen: Tracheal Aspirate; Respiratory  Result Value Ref Range Status   Specimen Description TRACHEAL ASPIRATE  Final   Special Requests NONE  Final   Gram Stain   Final    RARE WBC PRESENT, PREDOMINANTLY PMN RARE GRAM POSITIVE COCCI IN PAIRS RARE BUDDING YEAST SEEN    Culture   Final    FEW Consistent with normal respiratory flora. Performed at [facility], [place] [address], [place], [place] [address]    Report Status 09/03/2019 FINAL  Final  MRSA PCR Screening     Status: None   Collection Time: 09/01/19  4:51 AM   Specimen: Nasopharyngeal  Result Value Ref Range Status   MRSA by PCR NEGATIVE NEGATIVE Final    Comment:        The GeneXpert MRSA Assay (FDA approved for NASAL specimens only), is one component of a comprehensive MRSA colonization surveillance program. It is not intended to diagnose MRSA infection nor to guide or monitor treatment for MRSA infections. Performed at [facility], [place] [address], [place],  [place] [address]      Labs: Basic Metabolic Panel: Recent Labs  Lab 09/02/19 1637 09/03/19 0500 09/04/19 0500 09/06/19 0310 09/07/19 0500  NA  --  141 141 142 143  K  --  3.0* 3.4* 2.9* 3.4*  CL  --  100 102 106 106  CO2  --  28 27 26 27   GLUCOSE  --  92 110* 212* 147*  BUN  --  17 15 26* 23  CREATININE  --  0.69 0.88 0.77 0.66  CALCIUM  --  8.6* 8.7* 8.5* 8.7*  MG 2.2  --   --   --   --   PHOS 2.1*  --   --   --   --    Liver Function Tests: Recent Labs  Lab 09/03/19 0500 09/04/19 0500  AST 37 35  ALT 31 34  ALKPHOS 74 73  BILITOT 0.8 0.8  PROT 7.0 6.6  ALBUMIN 3.0* 2.9*   No results for input(s): LIPASE, AMYLASE in the last 168 hours. No results for input(s): AMMONIA in the last 168 hours. CBC: Recent Labs  Lab 09/03/19 0500 09/04/19 0500 09/05/19 0619  WBC 19.1* 14.5* 9.7  HGB 15.5 15.4 13.9  HCT 45.6 44.7 40.9  MCV 93.6 92.9 93.0  PLT 195 197 187   Cardiac Enzymes: No results for input(s): CKTOTAL, CKMB, CKMBINDEX, TROPONINI in the last 168 hours. BNP: BNP (last 3 results) Recent Labs    03/04/19 0455 08/31/19 2258  BNP 124.0* 91.7  ProBNP (last 3 results) No results for input(s): PROBNP in the last 8760 hours.  CBG: Recent Labs  Lab 09/08/19 1619 09/08/19 2036 09/09/19 0034 09/09/19 0352 09/09/19 0752  GLUCAP 179* 153* 141* 143* 157*       Signed:  Kayleen Memos, MD [facility] 09/09/2019, 11:14 AM

## 2019-09-09 NOTE — TOC Transition Note (Signed)
Transition of Care [facility]) - CM/SW Discharge Note   Patient Details  Name: John Blackburn MRN: [id number] Date of Birth: 1946-07-11  [facility] of [facility]) CM/SW Contact:  Benard Halsted, LCSW Phone Number: 09/09/2019, 3:53 PM   Clinical Narrative:    [facility] ready to accept patient and confirmed his long term Medicaid status. Patient's son, [name] is aware.   Patient will DC to: [facility] Anticipated DC date: 09/09/19 Family notified: Son, [name] Transport by: Corey Harold    Per MD patient ready for DC to [facility], patient, patient's family, and facility notified of DC. Discharge Summary and FL2 sent to facility. RN to call report prior to discharge [phone]). DC packet on chart. Ambulance transport requested for patient.   CSW will sign off for now as social work intervention is no longer needed. Please consult [place] again if new needs arise.  Cedric Fishman, LCSW Clinical Social Worker [phone]    Final next level of care: Skilled Nursing Facility Barriers to Discharge: No Barriers Identified   Patient Goals and CMS Choice Patient states their goals for this hospitalization and ongoing recovery are:: Pt will go back to SNF once he is medically ready CMS Medicare.gov Compare Post Acute Care list provided to:: Patient Choice offered to / list presented to : Patient  Discharge Placement   Existing PASRR number confirmed : 09/09/19          Patient chooses bed at: [facility] Patient to be transferred to facility by: [facility] Name of family member notified: Son, [name] Patient and family notified of of transfer: 09/09/19  Discharge Plan and Services     Post Acute Care Choice: [facility]                               Social Determinants of Health (SDOH) Interventions     Readmission Risk Interventions No flowsheet data found.

## 2019-09-09 NOTE — Progress Notes (Signed)
Called report to [facility], Juan Quam, RN. All questions asked. IV removed, condom cath to remain with patient. All belongings packed up, patient bathed and changed. No other concerns at this time. Son [name] aware.

## 2019-09-10 ENCOUNTER — Telehealth: Payer: Self-pay | Admitting: *Deleted

## 2019-09-10 DIAGNOSIS — R0989 Other specified symptoms and signs involving the circulatory and respiratory systems: Secondary | ICD-10-CM | POA: Diagnosis not present

## 2019-09-10 DIAGNOSIS — Z79899 Other long term (current) drug therapy: Secondary | ICD-10-CM | POA: Diagnosis not present

## 2019-09-10 DIAGNOSIS — J159 Unspecified bacterial pneumonia: Secondary | ICD-10-CM | POA: Diagnosis not present

## 2019-09-10 DIAGNOSIS — E559 Vitamin D deficiency, unspecified: Secondary | ICD-10-CM | POA: Diagnosis not present

## 2019-09-10 DIAGNOSIS — I5030 Unspecified diastolic (congestive) heart failure: Secondary | ICD-10-CM | POA: Diagnosis not present

## 2019-09-10 DIAGNOSIS — U071 COVID-19: Secondary | ICD-10-CM | POA: Diagnosis not present

## 2019-09-10 DIAGNOSIS — E039 Hypothyroidism, unspecified: Secondary | ICD-10-CM | POA: Diagnosis not present

## 2019-09-10 DIAGNOSIS — F0390 Unspecified dementia without behavioral disturbance: Secondary | ICD-10-CM | POA: Diagnosis not present

## 2019-09-10 DIAGNOSIS — I10 Essential (primary) hypertension: Secondary | ICD-10-CM | POA: Diagnosis not present

## 2019-09-10 DIAGNOSIS — I25810 Atherosclerosis of coronary artery bypass graft(s) without angina pectoris: Secondary | ICD-10-CM | POA: Diagnosis not present

## 2019-09-10 NOTE — Telephone Encounter (Signed)
Pt was on TCM report admitted 08/31/19 for evaluation from nausea, vomitting, and dyspenia. Patient developed altered mental status and hypoxia in the ED required intubation, was transferred to ICU. He was found to be COVID-19 positive. Patient was extubated on 09/02/2019. [facility] assumed care on 09/04/2019.  Completed course of remdesivir.  Also completed 7 days of IV Zosyn for superimposed bacterial pneumonia. Pt D/C to SNF and will have to be Quarantine till January 16, 202. Per d/c will need to f/u w/PCP once discharge from SNF../l;mb

## 2019-09-20 DIAGNOSIS — U071 COVID-19: Secondary | ICD-10-CM | POA: Diagnosis not present

## 2019-09-20 DIAGNOSIS — R1310 Dysphagia, unspecified: Secondary | ICD-10-CM | POA: Diagnosis not present

## 2019-09-20 DIAGNOSIS — I5030 Unspecified diastolic (congestive) heart failure: Secondary | ICD-10-CM | POA: Diagnosis not present

## 2019-09-20 DIAGNOSIS — I10 Essential (primary) hypertension: Secondary | ICD-10-CM | POA: Diagnosis not present

## 2019-09-20 DIAGNOSIS — E559 Vitamin D deficiency, unspecified: Secondary | ICD-10-CM | POA: Diagnosis not present

## 2019-09-20 DIAGNOSIS — I25810 Atherosclerosis of coronary artery bypass graft(s) without angina pectoris: Secondary | ICD-10-CM | POA: Diagnosis not present

## 2019-09-20 DIAGNOSIS — F0390 Unspecified dementia without behavioral disturbance: Secondary | ICD-10-CM | POA: Diagnosis not present

## 2019-09-27 DIAGNOSIS — I10 Essential (primary) hypertension: Secondary | ICD-10-CM | POA: Diagnosis not present

## 2019-09-27 DIAGNOSIS — U071 COVID-19: Secondary | ICD-10-CM | POA: Diagnosis not present

## 2019-09-27 DIAGNOSIS — I5030 Unspecified diastolic (congestive) heart failure: Secondary | ICD-10-CM | POA: Diagnosis not present

## 2019-09-27 DIAGNOSIS — I25810 Atherosclerosis of coronary artery bypass graft(s) without angina pectoris: Secondary | ICD-10-CM | POA: Diagnosis not present

## 2019-09-27 DIAGNOSIS — R1310 Dysphagia, unspecified: Secondary | ICD-10-CM | POA: Diagnosis not present

## 2019-09-27 DIAGNOSIS — E559 Vitamin D deficiency, unspecified: Secondary | ICD-10-CM | POA: Diagnosis not present

## 2019-09-27 DIAGNOSIS — F0390 Unspecified dementia without behavioral disturbance: Secondary | ICD-10-CM | POA: Diagnosis not present

## 2019-10-01 DIAGNOSIS — I5030 Unspecified diastolic (congestive) heart failure: Secondary | ICD-10-CM | POA: Diagnosis not present

## 2019-10-01 DIAGNOSIS — I259 Chronic ischemic heart disease, unspecified: Secondary | ICD-10-CM | POA: Diagnosis not present

## 2019-10-01 DIAGNOSIS — M6281 Muscle weakness (generalized): Secondary | ICD-10-CM | POA: Diagnosis not present

## 2019-10-01 DIAGNOSIS — U071 COVID-19: Secondary | ICD-10-CM | POA: Diagnosis not present

## 2019-10-01 DIAGNOSIS — R2681 Unsteadiness on feet: Secondary | ICD-10-CM | POA: Diagnosis not present

## 2019-10-02 DIAGNOSIS — U071 COVID-19: Secondary | ICD-10-CM | POA: Diagnosis not present

## 2019-10-02 DIAGNOSIS — F0150 Vascular dementia without behavioral disturbance: Secondary | ICD-10-CM | POA: Diagnosis not present

## 2019-10-02 DIAGNOSIS — I2510 Atherosclerotic heart disease of native coronary artery without angina pectoris: Secondary | ICD-10-CM | POA: Diagnosis not present

## 2019-10-02 DIAGNOSIS — G4739 Other sleep apnea: Secondary | ICD-10-CM | POA: Diagnosis not present

## 2019-10-02 DIAGNOSIS — J9601 Acute respiratory failure with hypoxia: Secondary | ICD-10-CM | POA: Diagnosis not present

## 2019-10-02 DIAGNOSIS — I5032 Chronic diastolic (congestive) heart failure: Secondary | ICD-10-CM | POA: Diagnosis not present

## 2019-10-02 DIAGNOSIS — I10 Essential (primary) hypertension: Secondary | ICD-10-CM | POA: Diagnosis not present

## 2019-10-07 DIAGNOSIS — U071 COVID-19: Secondary | ICD-10-CM | POA: Diagnosis not present

## 2019-10-07 DIAGNOSIS — I5030 Unspecified diastolic (congestive) heart failure: Secondary | ICD-10-CM | POA: Diagnosis not present

## 2019-10-07 DIAGNOSIS — M6281 Muscle weakness (generalized): Secondary | ICD-10-CM | POA: Diagnosis not present

## 2019-10-07 DIAGNOSIS — I259 Chronic ischemic heart disease, unspecified: Secondary | ICD-10-CM | POA: Diagnosis not present

## 2019-10-07 DIAGNOSIS — R2681 Unsteadiness on feet: Secondary | ICD-10-CM | POA: Diagnosis not present

## 2019-10-28 DIAGNOSIS — I10 Essential (primary) hypertension: Secondary | ICD-10-CM | POA: Diagnosis not present

## 2019-10-28 DIAGNOSIS — F0150 Vascular dementia without behavioral disturbance: Secondary | ICD-10-CM | POA: Diagnosis not present

## 2019-10-28 DIAGNOSIS — I5032 Chronic diastolic (congestive) heart failure: Secondary | ICD-10-CM | POA: Diagnosis not present

## 2019-10-28 DIAGNOSIS — G4739 Other sleep apnea: Secondary | ICD-10-CM | POA: Diagnosis not present

## 2019-10-28 DIAGNOSIS — U071 COVID-19: Secondary | ICD-10-CM | POA: Diagnosis not present

## 2019-10-28 DIAGNOSIS — I2510 Atherosclerotic heart disease of native coronary artery without angina pectoris: Secondary | ICD-10-CM | POA: Diagnosis not present

## 2019-11-01 DIAGNOSIS — R195 Other fecal abnormalities: Secondary | ICD-10-CM | POA: Diagnosis not present

## 2019-11-01 DIAGNOSIS — R5381 Other malaise: Secondary | ICD-10-CM | POA: Diagnosis not present

## 2019-11-04 DIAGNOSIS — I5030 Unspecified diastolic (congestive) heart failure: Secondary | ICD-10-CM | POA: Diagnosis not present

## 2019-11-04 DIAGNOSIS — R2681 Unsteadiness on feet: Secondary | ICD-10-CM | POA: Diagnosis not present

## 2019-11-04 DIAGNOSIS — I259 Chronic ischemic heart disease, unspecified: Secondary | ICD-10-CM | POA: Diagnosis not present

## 2019-11-04 DIAGNOSIS — U071 COVID-19: Secondary | ICD-10-CM | POA: Diagnosis not present

## 2019-11-04 DIAGNOSIS — M6281 Muscle weakness (generalized): Secondary | ICD-10-CM | POA: Diagnosis not present

## 2019-11-11 DIAGNOSIS — F0150 Vascular dementia without behavioral disturbance: Secondary | ICD-10-CM | POA: Diagnosis not present

## 2019-11-11 DIAGNOSIS — R195 Other fecal abnormalities: Secondary | ICD-10-CM | POA: Diagnosis not present

## 2019-11-15 DIAGNOSIS — R197 Diarrhea, unspecified: Secondary | ICD-10-CM | POA: Diagnosis not present

## 2019-11-15 DIAGNOSIS — R195 Other fecal abnormalities: Secondary | ICD-10-CM | POA: Diagnosis not present

## 2019-11-15 DIAGNOSIS — D72828 Other elevated white blood cell count: Secondary | ICD-10-CM | POA: Diagnosis not present

## 2019-11-18 DIAGNOSIS — R195 Other fecal abnormalities: Secondary | ICD-10-CM | POA: Diagnosis not present

## 2019-11-18 DIAGNOSIS — D72828 Other elevated white blood cell count: Secondary | ICD-10-CM | POA: Diagnosis not present

## 2019-11-20 DIAGNOSIS — R195 Other fecal abnormalities: Secondary | ICD-10-CM | POA: Diagnosis not present

## 2019-11-20 DIAGNOSIS — R634 Abnormal weight loss: Secondary | ICD-10-CM | POA: Diagnosis not present

## 2019-11-20 DIAGNOSIS — I10 Essential (primary) hypertension: Secondary | ICD-10-CM | POA: Diagnosis not present

## 2019-11-20 DIAGNOSIS — I2510 Atherosclerotic heart disease of native coronary artery without angina pectoris: Secondary | ICD-10-CM | POA: Diagnosis not present

## 2019-11-20 DIAGNOSIS — Z86711 Personal history of pulmonary embolism: Secondary | ICD-10-CM | POA: Diagnosis not present

## 2019-11-20 DIAGNOSIS — K219 Gastro-esophageal reflux disease without esophagitis: Secondary | ICD-10-CM | POA: Diagnosis not present

## 2019-11-20 DIAGNOSIS — R262 Difficulty in walking, not elsewhere classified: Secondary | ICD-10-CM | POA: Diagnosis not present

## 2019-11-20 DIAGNOSIS — U071 COVID-19: Secondary | ICD-10-CM | POA: Diagnosis not present

## 2019-11-20 DIAGNOSIS — G4733 Obstructive sleep apnea (adult) (pediatric): Secondary | ICD-10-CM | POA: Diagnosis not present

## 2019-11-20 DIAGNOSIS — I5032 Chronic diastolic (congestive) heart failure: Secondary | ICD-10-CM | POA: Diagnosis not present

## 2019-11-20 DIAGNOSIS — I6789 Other cerebrovascular disease: Secondary | ICD-10-CM | POA: Diagnosis not present

## 2019-11-20 DIAGNOSIS — F0150 Vascular dementia without behavioral disturbance: Secondary | ICD-10-CM | POA: Diagnosis not present

## 2019-11-29 DIAGNOSIS — M545 Low back pain: Secondary | ICD-10-CM | POA: Diagnosis not present

## 2019-11-29 DIAGNOSIS — R195 Other fecal abnormalities: Secondary | ICD-10-CM | POA: Diagnosis not present

## 2019-11-30 DIAGNOSIS — M160 Bilateral primary osteoarthritis of hip: Secondary | ICD-10-CM | POA: Diagnosis not present

## 2019-11-30 DIAGNOSIS — M25551 Pain in right hip: Secondary | ICD-10-CM | POA: Diagnosis not present

## 2019-11-30 DIAGNOSIS — M25552 Pain in left hip: Secondary | ICD-10-CM | POA: Diagnosis not present

## 2019-11-30 DIAGNOSIS — M5136 Other intervertebral disc degeneration, lumbar region: Secondary | ICD-10-CM | POA: Diagnosis not present

## 2019-11-30 DIAGNOSIS — M545 Low back pain: Secondary | ICD-10-CM | POA: Diagnosis not present

## 2019-12-02 DIAGNOSIS — R600 Localized edema: Secondary | ICD-10-CM | POA: Diagnosis not present

## 2019-12-02 DIAGNOSIS — Z0189 Encounter for other specified special examinations: Secondary | ICD-10-CM | POA: Diagnosis not present

## 2019-12-02 DIAGNOSIS — M169 Osteoarthritis of hip, unspecified: Secondary | ICD-10-CM | POA: Diagnosis not present

## 2019-12-02 DIAGNOSIS — M5136 Other intervertebral disc degeneration, lumbar region: Secondary | ICD-10-CM | POA: Diagnosis not present

## 2019-12-18 DIAGNOSIS — R600 Localized edema: Secondary | ICD-10-CM | POA: Diagnosis not present

## 2019-12-18 DIAGNOSIS — I10 Essential (primary) hypertension: Secondary | ICD-10-CM | POA: Diagnosis not present

## 2019-12-18 DIAGNOSIS — I5032 Chronic diastolic (congestive) heart failure: Secondary | ICD-10-CM | POA: Diagnosis not present

## 2019-12-19 DIAGNOSIS — R2243 Localized swelling, mass and lump, lower limb, bilateral: Secondary | ICD-10-CM | POA: Diagnosis not present

## 2019-12-19 DIAGNOSIS — I82412 Acute embolism and thrombosis of left femoral vein: Secondary | ICD-10-CM | POA: Diagnosis not present

## 2019-12-20 DIAGNOSIS — I82402 Acute embolism and thrombosis of unspecified deep veins of left lower extremity: Secondary | ICD-10-CM | POA: Diagnosis not present

## 2019-12-20 DIAGNOSIS — Z20828 Contact with and (suspected) exposure to other viral communicable diseases: Secondary | ICD-10-CM | POA: Diagnosis not present

## 2019-12-20 DIAGNOSIS — I110 Hypertensive heart disease with heart failure: Secondary | ICD-10-CM | POA: Diagnosis not present

## 2019-12-20 DIAGNOSIS — R609 Edema, unspecified: Secondary | ICD-10-CM | POA: Diagnosis not present

## 2019-12-20 DIAGNOSIS — I10 Essential (primary) hypertension: Secondary | ICD-10-CM | POA: Diagnosis not present

## 2019-12-30 DIAGNOSIS — M5489 Other dorsalgia: Secondary | ICD-10-CM | POA: Diagnosis not present

## 2019-12-30 DIAGNOSIS — R451 Restlessness and agitation: Secondary | ICD-10-CM | POA: Diagnosis not present

## 2020-01-03 DIAGNOSIS — I5032 Chronic diastolic (congestive) heart failure: Secondary | ICD-10-CM | POA: Diagnosis not present

## 2020-01-03 DIAGNOSIS — I82402 Acute embolism and thrombosis of unspecified deep veins of left lower extremity: Secondary | ICD-10-CM | POA: Diagnosis not present

## 2020-01-03 DIAGNOSIS — M5136 Other intervertebral disc degeneration, lumbar region: Secondary | ICD-10-CM | POA: Diagnosis not present

## 2020-01-03 DIAGNOSIS — I10 Essential (primary) hypertension: Secondary | ICD-10-CM | POA: Diagnosis not present

## 2020-01-06 DIAGNOSIS — Z20828 Contact with and (suspected) exposure to other viral communicable diseases: Secondary | ICD-10-CM | POA: Diagnosis not present

## 2020-01-08 DIAGNOSIS — M545 Low back pain: Secondary | ICD-10-CM | POA: Diagnosis not present

## 2020-01-08 DIAGNOSIS — R600 Localized edema: Secondary | ICD-10-CM | POA: Diagnosis not present

## 2020-01-08 DIAGNOSIS — I82409 Acute embolism and thrombosis of unspecified deep veins of unspecified lower extremity: Secondary | ICD-10-CM | POA: Diagnosis not present

## 2020-01-08 DIAGNOSIS — I5032 Chronic diastolic (congestive) heart failure: Secondary | ICD-10-CM | POA: Diagnosis not present

## 2020-01-17 DIAGNOSIS — I110 Hypertensive heart disease with heart failure: Secondary | ICD-10-CM | POA: Diagnosis not present

## 2020-01-17 DIAGNOSIS — M6281 Muscle weakness (generalized): Secondary | ICD-10-CM | POA: Diagnosis not present

## 2020-01-29 ENCOUNTER — Other Ambulatory Visit: Payer: Self-pay | Admitting: Family Medicine

## 2020-01-29 ENCOUNTER — Other Ambulatory Visit (HOSPITAL_COMMUNITY): Payer: Self-pay | Admitting: Family Medicine

## 2020-01-29 DIAGNOSIS — M545 Low back pain, unspecified: Secondary | ICD-10-CM

## 2020-02-04 DIAGNOSIS — I110 Hypertensive heart disease with heart failure: Secondary | ICD-10-CM | POA: Diagnosis not present

## 2020-02-04 DIAGNOSIS — M6281 Muscle weakness (generalized): Secondary | ICD-10-CM | POA: Diagnosis not present

## 2020-02-13 ENCOUNTER — Ambulatory Visit (HOSPITAL_COMMUNITY): Payer: PPO

## 2020-02-13 ENCOUNTER — Encounter (HOSPITAL_COMMUNITY): Payer: Self-pay

## 2020-02-18 DIAGNOSIS — F0150 Vascular dementia without behavioral disturbance: Secondary | ICD-10-CM | POA: Diagnosis not present

## 2020-02-18 DIAGNOSIS — I340 Nonrheumatic mitral (valve) insufficiency: Secondary | ICD-10-CM | POA: Diagnosis not present

## 2020-02-18 DIAGNOSIS — I5032 Chronic diastolic (congestive) heart failure: Secondary | ICD-10-CM | POA: Diagnosis not present

## 2020-02-18 DIAGNOSIS — I5030 Unspecified diastolic (congestive) heart failure: Secondary | ICD-10-CM | POA: Diagnosis not present

## 2020-02-18 DIAGNOSIS — I10 Essential (primary) hypertension: Secondary | ICD-10-CM | POA: Diagnosis not present

## 2020-02-18 DIAGNOSIS — I2510 Atherosclerotic heart disease of native coronary artery without angina pectoris: Secondary | ICD-10-CM | POA: Diagnosis not present

## 2020-02-18 DIAGNOSIS — Z8616 Personal history of COVID-19: Secondary | ICD-10-CM | POA: Diagnosis not present

## 2020-02-18 DIAGNOSIS — I82409 Acute embolism and thrombosis of unspecified deep veins of unspecified lower extremity: Secondary | ICD-10-CM | POA: Diagnosis not present

## 2020-02-18 DIAGNOSIS — M545 Low back pain: Secondary | ICD-10-CM | POA: Diagnosis not present

## 2020-02-18 DIAGNOSIS — G4733 Obstructive sleep apnea (adult) (pediatric): Secondary | ICD-10-CM | POA: Diagnosis not present

## 2020-02-18 DIAGNOSIS — K219 Gastro-esophageal reflux disease without esophagitis: Secondary | ICD-10-CM | POA: Diagnosis not present

## 2020-02-18 DIAGNOSIS — R262 Difficulty in walking, not elsewhere classified: Secondary | ICD-10-CM | POA: Diagnosis not present

## 2020-02-18 DIAGNOSIS — I77810 Thoracic aortic ectasia: Secondary | ICD-10-CM | POA: Diagnosis not present

## 2020-02-18 DIAGNOSIS — R200 Anesthesia of skin: Secondary | ICD-10-CM | POA: Diagnosis not present

## 2020-02-18 DIAGNOSIS — I517 Cardiomegaly: Secondary | ICD-10-CM | POA: Diagnosis not present

## 2020-02-18 DIAGNOSIS — R198 Other specified symptoms and signs involving the digestive system and abdomen: Secondary | ICD-10-CM | POA: Diagnosis not present

## 2020-02-26 DIAGNOSIS — I5032 Chronic diastolic (congestive) heart failure: Secondary | ICD-10-CM | POA: Diagnosis not present

## 2020-02-26 DIAGNOSIS — E8881 Metabolic syndrome: Secondary | ICD-10-CM | POA: Diagnosis not present

## 2020-02-26 DIAGNOSIS — E538 Deficiency of other specified B group vitamins: Secondary | ICD-10-CM | POA: Diagnosis not present

## 2020-03-02 DIAGNOSIS — Z20822 Contact with and (suspected) exposure to covid-19: Secondary | ICD-10-CM | POA: Diagnosis not present

## 2020-03-05 DIAGNOSIS — I110 Hypertensive heart disease with heart failure: Secondary | ICD-10-CM | POA: Diagnosis not present

## 2020-03-05 DIAGNOSIS — M6281 Muscle weakness (generalized): Secondary | ICD-10-CM | POA: Diagnosis not present

## 2020-03-06 ENCOUNTER — Ambulatory Visit (HOSPITAL_COMMUNITY)
Admission: RE | Admit: 2020-03-06 | Discharge: 2020-03-06 | Disposition: A | Discharge status: Home / Self Care | Payer: PPO | Source: Ambulatory Visit | Attending: Family Medicine | Admitting: Family Medicine

## 2020-03-06 ENCOUNTER — Other Ambulatory Visit: Payer: Self-pay

## 2020-03-06 ENCOUNTER — Other Ambulatory Visit (HOSPITAL_COMMUNITY): Payer: Self-pay | Admitting: Family Medicine

## 2020-03-06 DIAGNOSIS — M4316 Spondylolisthesis, lumbar region: Secondary | ICD-10-CM | POA: Diagnosis not present

## 2020-03-06 DIAGNOSIS — M4804 Spinal stenosis, thoracic region: Secondary | ICD-10-CM | POA: Diagnosis not present

## 2020-03-06 DIAGNOSIS — M47814 Spondylosis without myelopathy or radiculopathy, thoracic region: Secondary | ICD-10-CM | POA: Diagnosis not present

## 2020-03-06 DIAGNOSIS — M545 Low back pain, unspecified: Secondary | ICD-10-CM

## 2020-03-06 DIAGNOSIS — J90 Pleural effusion, not elsewhere classified: Secondary | ICD-10-CM | POA: Diagnosis not present

## 2020-03-06 DIAGNOSIS — M48061 Spinal stenosis, lumbar region without neurogenic claudication: Secondary | ICD-10-CM | POA: Diagnosis not present

## 2020-03-06 IMAGING — MR MR LUMBAR SPINE W/O CM
3 series · 30 of 48 positions shown · non-contrast
Comparison: Chest CT [DATE]

CLINICAL DATA: Low back pain.

EXAM:
MRI THORACIC AND LUMBAR SPINE WITHOUT CONTRAST
TECHNIQUE: Multiplanar and multiecho pulse sequences of the thoracic and lumbar
spine were obtained without intravenous contrast.

[Series 23: T1 · sagittal · 4.0mm · 0.81mm/px · 8 of 19 slices shown (1 of 2)]
[im 1/19]
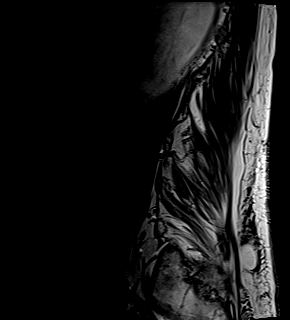
[im 3/19]
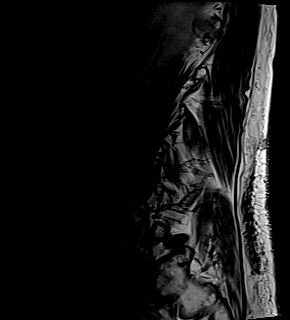
[im 6/19]
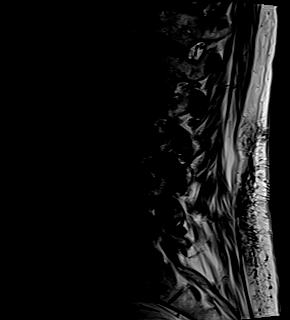
[im 8/19]
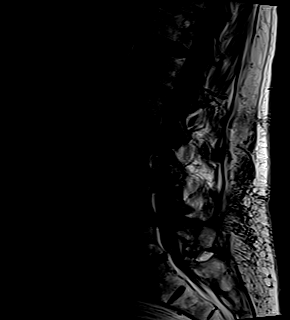
[im 11/19]
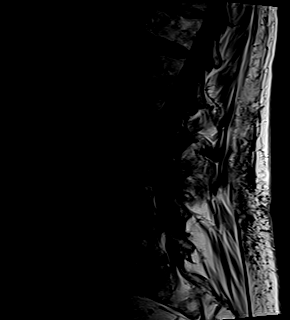
[im 13/19]
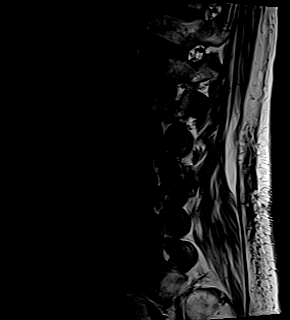
[im 16/19]
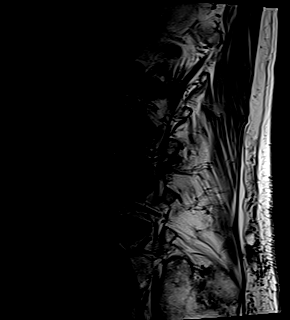
[im 19/19]
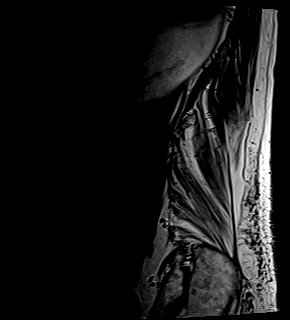

[Series 26: T2 · axial · 4.0mm · 0.62mm/px · z∈[-428,-211]mm · 13 of 43 slices shown]
[im 1/43]
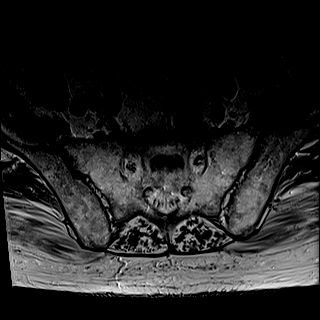
[im 3/43]
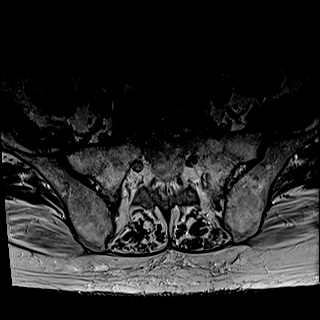
[im 5/43]
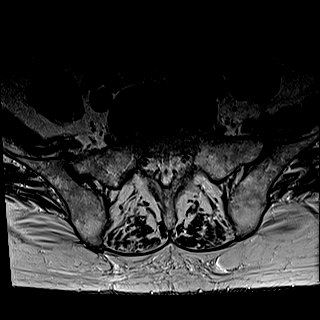
[im 7/43]
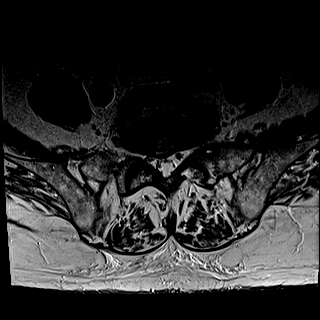
[im 9/43]
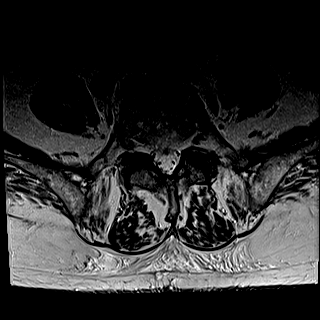
[im 12/43]
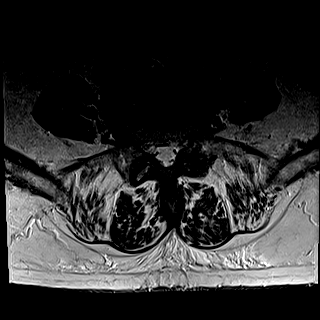
[im 14/43]
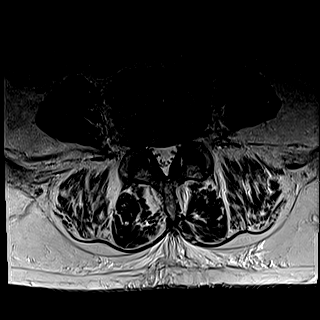
[im 18/43]
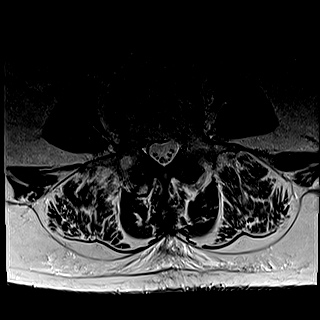
[im 23/43]
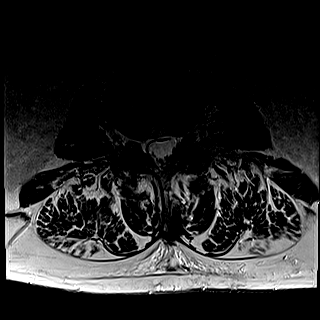
[im 25/43]
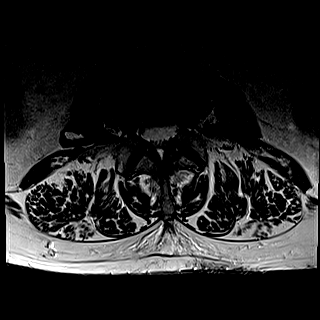
[im 29/43]
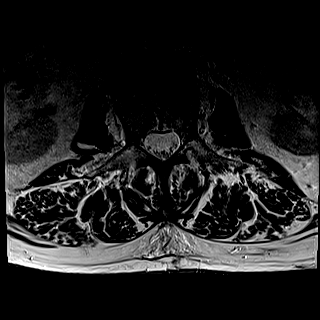
[im 36/43]
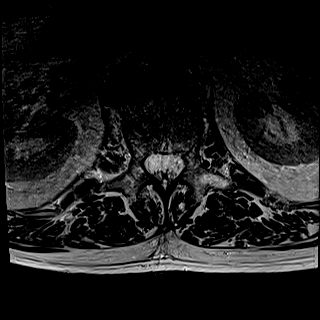
[im 40/43]
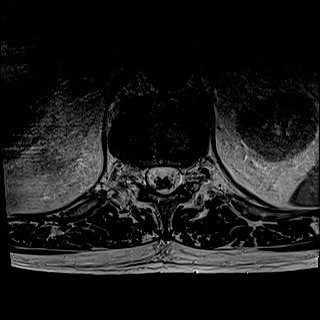

[Series 27: T1 · axial · 4.0mm · 0.39mm/px · z∈[-418,-211]mm · 9 of 43 slices shown (2 of 2)]
[im 3/43]
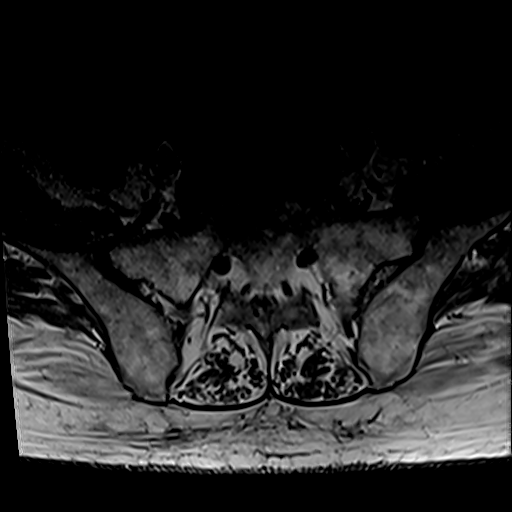
[im 7/43]
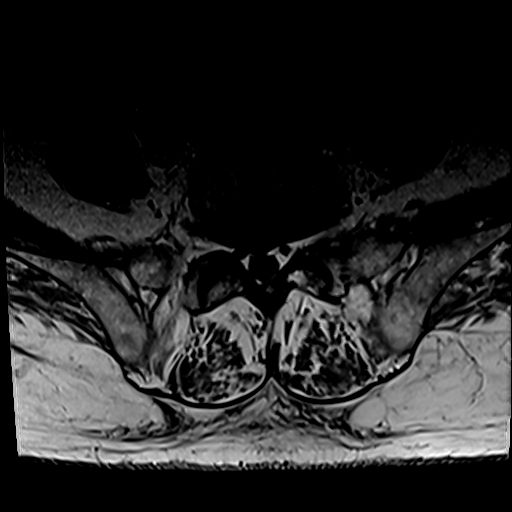
[im 14/43]
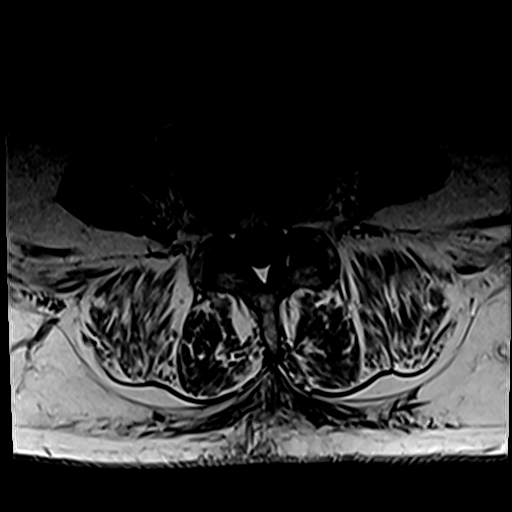
[im 18/43]
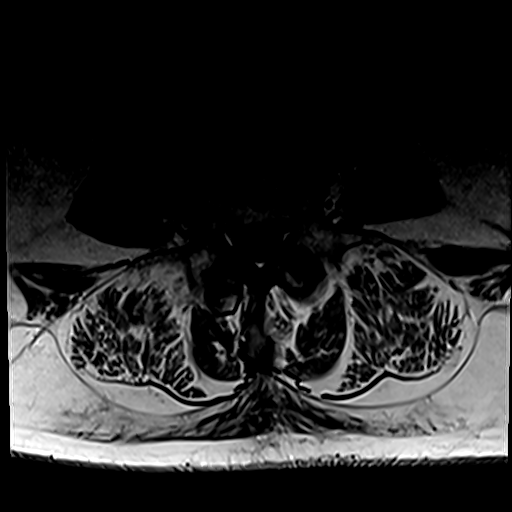
[im 23/43]
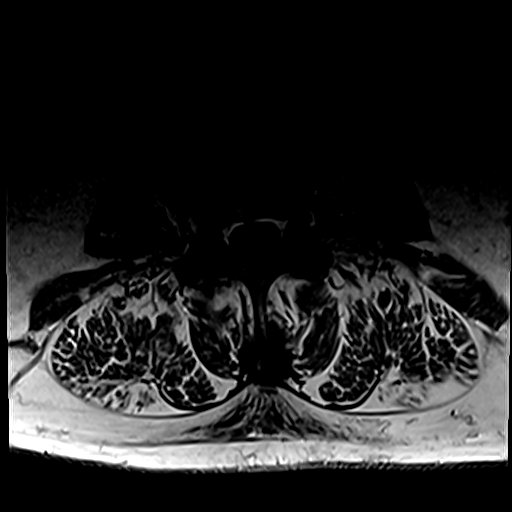
[im 25/43]
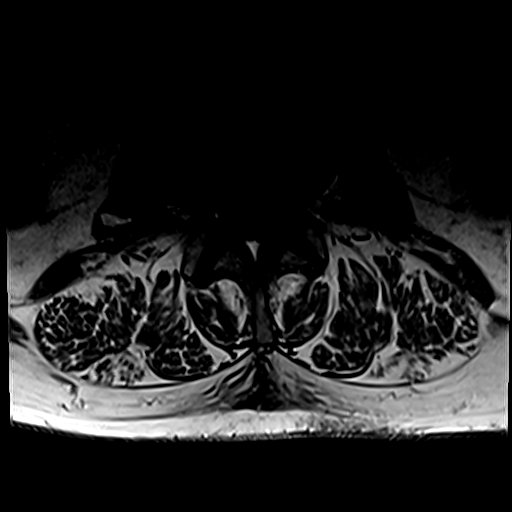
[im 29/43]
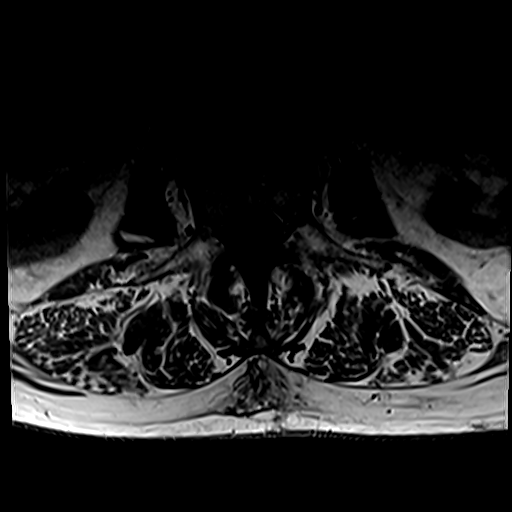
[im 36/43]
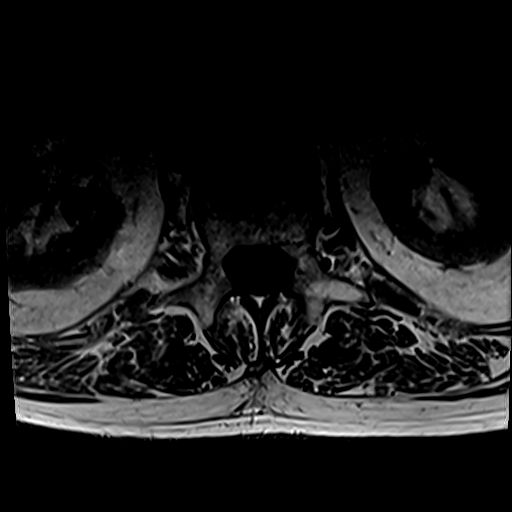
[im 40/43]
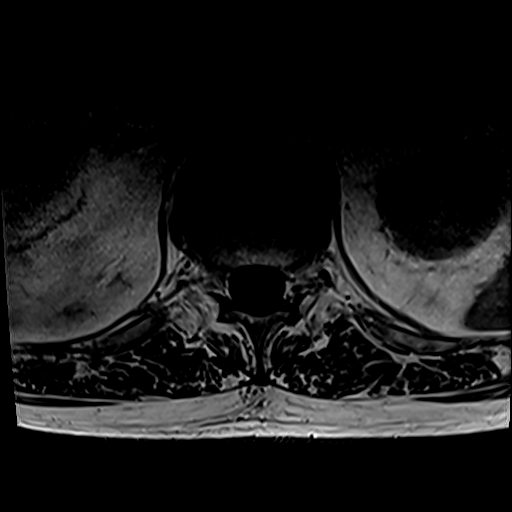

[30 of 48 positions shown; findings below may reference images not displayed]

FINDINGS: MRI THORACIC SPINE FINDINGS

Alignment:  Mild dextroconvex scoliosis of the thoracic spine

Vertebrae: No fracture or evidence of discitis. Elongated focus of
low T1 and T2 signal within the T5 vertebral body corresponds to
focus of sclerosis seen on prior CT and appears stable, allowing for
differences in technique.

Cord:  Normal signal and morphology.

Paraspinal and other soft tissues: Small bilateral pleural effusion.

Disc levels:

T1-2: Tiny posterior disc protrusion. No spinal canal or neural
foraminal stenosis.

T2-3: Posterior disc protrusion causes small indentation of the
thecal sac without significant spinal canal or neural foraminal
stenosis.

T3-4: Posterior disc protrusion causes small indentation of the
thecal sac without significant spinal canal or neural foraminal
stenosis.

T4-5: No spinal canal or neural foraminal stenosis.

T5-6: No spinal canal or neural foraminal stenosis.

T6-7: No spinal canal or neural foraminal stenosis.

T7-8: No spinal canal or neural foraminal stenosis.

T8-9: No spinal canal or neural foraminal stenosis.

T9-10: No spinal canal or neural foraminal stenosis.

T10-11: No spinal canal or neural foraminal stenosis.

T11-12: No spinal canal or neural foraminal stenosis.

MRI LUMBAR SPINE FINDINGS

Segmentation:  Standard.

Alignment:  Minimal retrolisthesis of L1 over L2-L2 over L3.

Vertebrae:  No fracture, evidence of discitis, or bone lesion.

Conus medullaris and cauda equina: Conus extends to the L1 level.
Conus and cauda equina appear normal.

Paraspinal and other soft tissues: Negative.

Disc levels:

T12-L1: No spinal canal or neural foraminal stenosis.

L1-2: Minimal disc bulge and mild facet degenerative changes without
significant spinal canal or neural foraminal stenosis.

L2-3: Shallow disc bulge, facet degenerative changes ligamentum
flavum redundancy resulting in narrowing of the subarticular zones,
left greater than right and mild bilateral neural foraminal
narrowing.

L3-4: Shallow disc bulge, moderate facet degenerative changes and
ligamentum flavum redundancy resulting in mild spinal canal
stenosis, mild right and moderate left neural foraminal narrowing.

L4-5: Disc bulge, facet degenerative changes and ligamentum flavum
redundancy resulting in mild spinal canal stenosis with narrowing of
the bilateral subarticular zones and mild to moderate bilateral
neural foraminal narrowing.

L5-S1: Right central posterior disc osteophyte complex causing mild
narrowing of the right subarticular zones. There also moderate facet
degenerative changes. No neural foraminal narrowing.
IMPRESSION: 1. No acute osseous abnormality.
2. Mild multilevel degenerative changes of the thoracic and lumbar
spine with mild spinal canal stenosis at L3-L4 and L4-L5 .
3. Moderate left neural foraminal narrowing at L3-4 and mild to
moderate bilateral neural foraminal narrowing at L4-L5.
4. Small bilateral pleural effusion.

## 2020-03-06 IMAGING — MR MR THORACIC SPINE W/O CM
6 of 8 series · 35 of 48 positions shown · non-contrast
Comparison: Chest CT [DATE]

CLINICAL DATA: Low back pain.

EXAM:
MRI THORACIC AND LUMBAR SPINE WITHOUT CONTRAST
TECHNIQUE: Multiplanar and multiecho pulse sequences of the thoracic and lumbar
spine were obtained without intravenous contrast.

[Series 16: T1 · sagittal · 4.0mm · 1.72mm/px · 3 of 12 slices shown (1 of 3)]
[im 1/12]
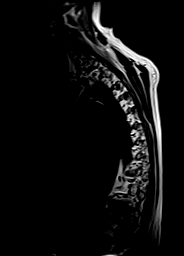
[im 6/12]
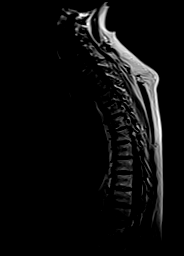
[im 12/12]
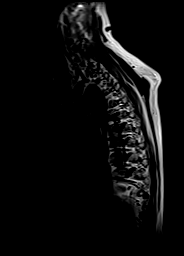

[Series 17: T2 · sagittal · 3.0mm · 0.83mm/px · 5 of 21 slices shown (1 of 2)]
[im 1/21]
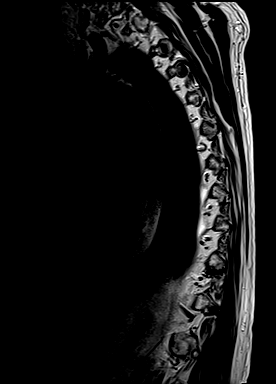
[im 6/21]
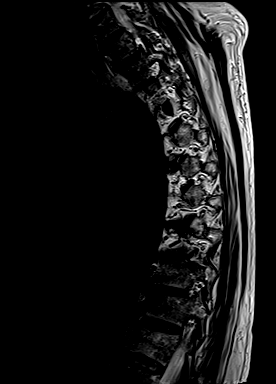
[im 11/21]
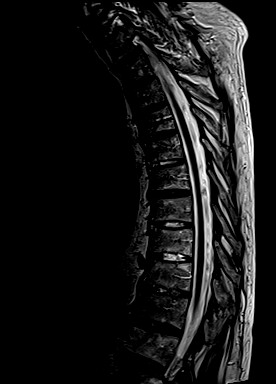
[im 16/21]
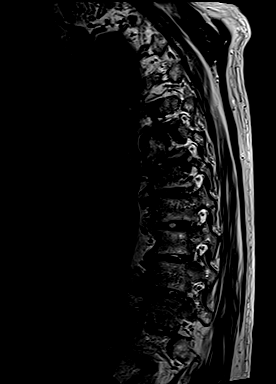
[im 21/21]
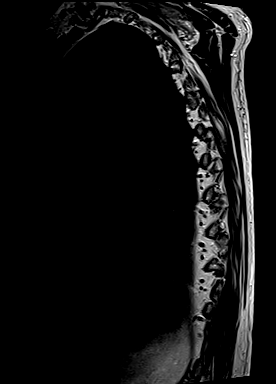

[Series 18: T1 · sagittal · 3.0mm · 1.00mm/px · 5 of 21 slices shown (2 of 3)]
[im 1/21]
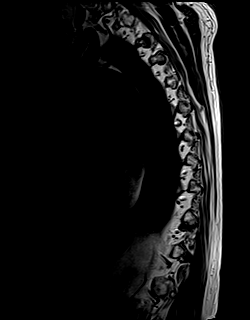
[im 6/21]
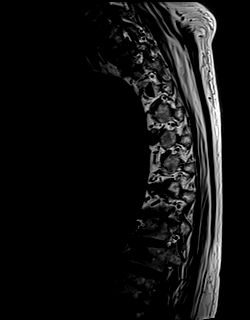
[im 11/21]
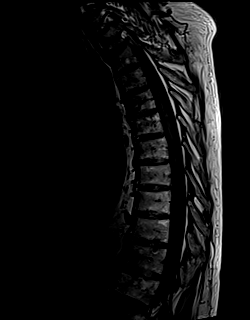
[im 16/21]
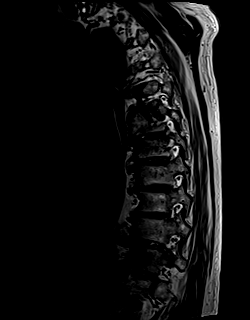
[im 21/21]
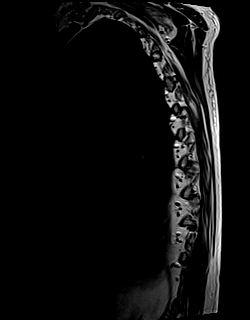

[Series 19: STIR · sagittal · 3.0mm · 1.00mm/px · 5 of 21 slices shown]
[im 1/21]
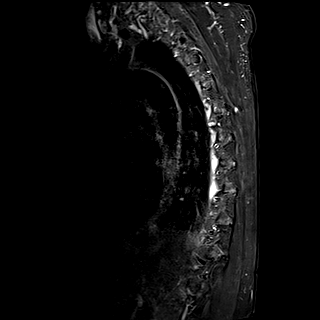
[im 6/21]
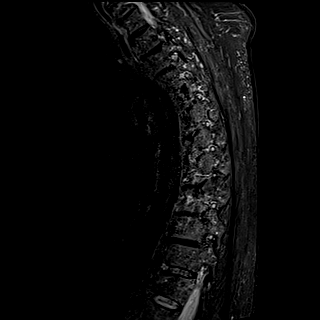
[im 11/21]
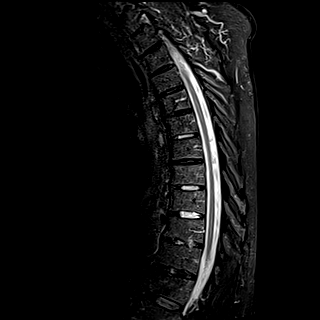
[im 16/21]
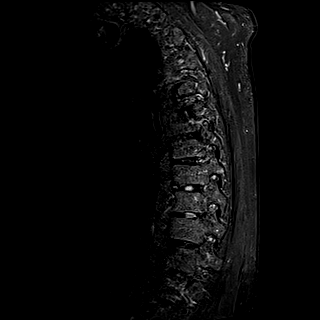
[im 21/21]
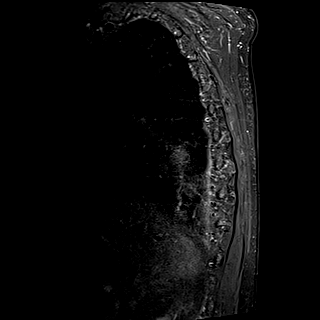

[Series 20: T2 · axial · 4.0mm · 0.78mm/px · z∈[-201,+18]mm · 9 of 39 slices shown (2 of 2)]
[im 1/39]
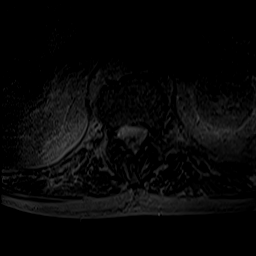
[im 5/39]
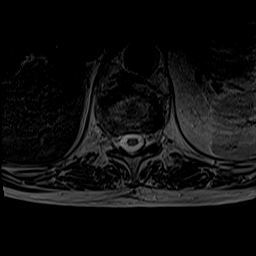
[im 10/39]
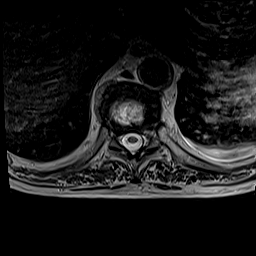
[im 15/39]
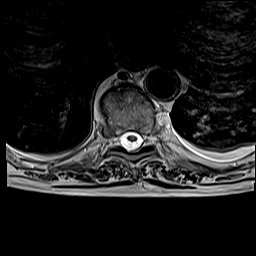
[im 20/39]
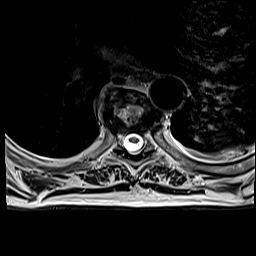
[im 24/39]
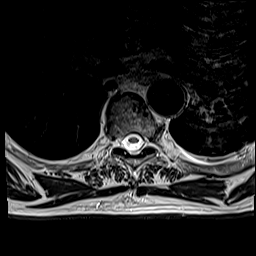
[im 29/39]
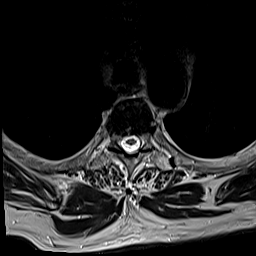
[im 34/39]
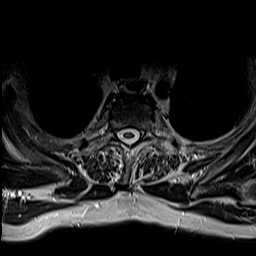
[im 39/39]
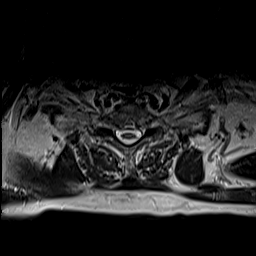

[Series 22: T1 · axial · 4.0mm · 0.39mm/px · z∈[-201,+18]mm · 8 of 39 slices shown (3 of 3)]
[im 1/39]
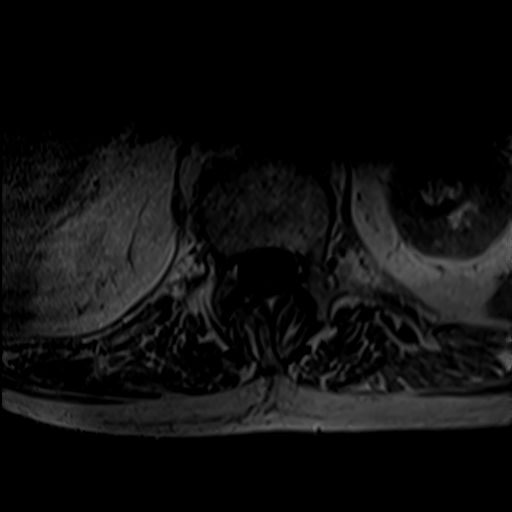
[im 5/39]
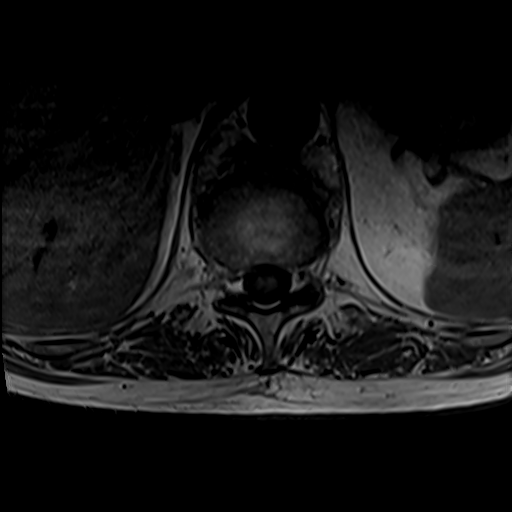
[im 10/39]
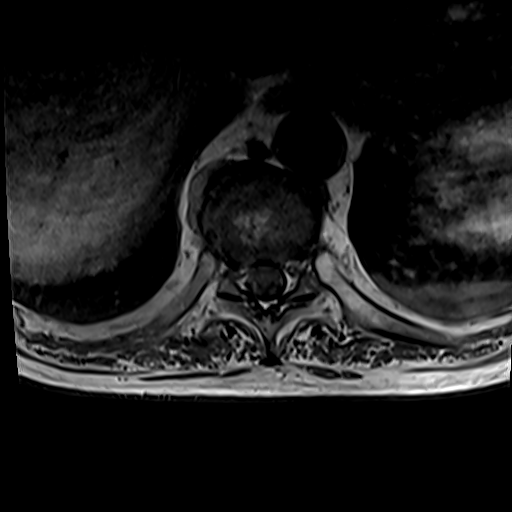
[im 15/39]
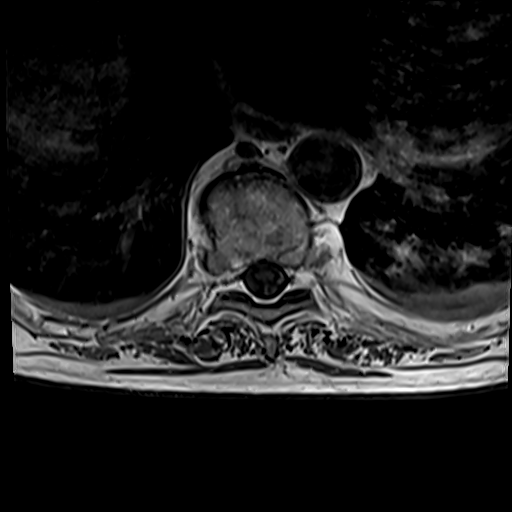
[im 24/39]
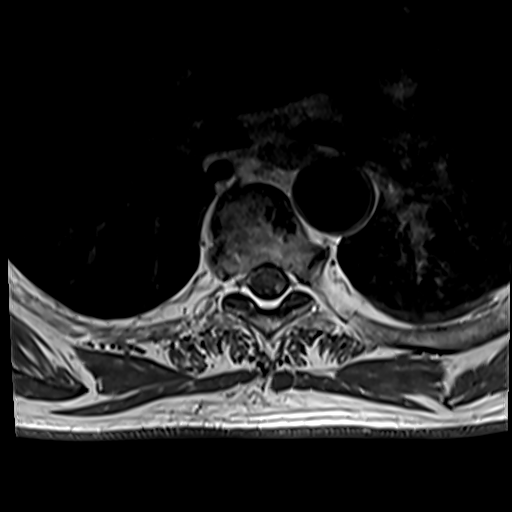
[im 29/39]
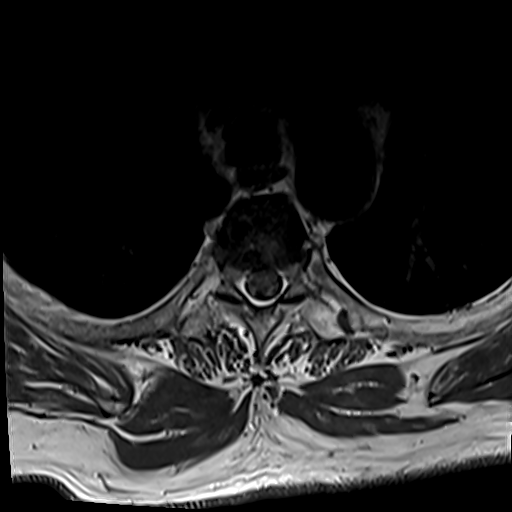
[im 34/39]
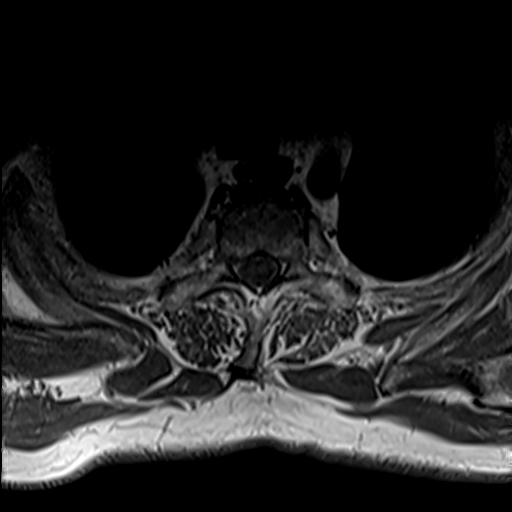
[im 39/39]
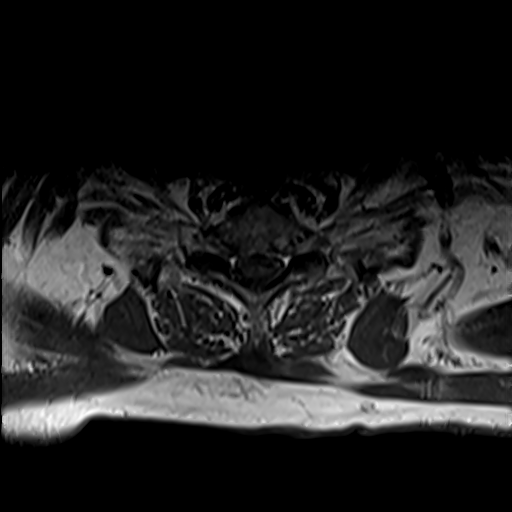

[35 of 48 positions shown; findings below may reference images not displayed]

FINDINGS: MRI THORACIC SPINE FINDINGS

Alignment:  Mild dextroconvex scoliosis of the thoracic spine

Vertebrae: No fracture or evidence of discitis. Elongated focus of
low T1 and T2 signal within the T5 vertebral body corresponds to
focus of sclerosis seen on prior CT and appears stable, allowing for
differences in technique.

Cord:  Normal signal and morphology.

Paraspinal and other soft tissues: Small bilateral pleural effusion.

Disc levels:

T1-2: Tiny posterior disc protrusion. No spinal canal or neural
foraminal stenosis.

T2-3: Posterior disc protrusion causes small indentation of the
thecal sac without significant spinal canal or neural foraminal
stenosis.

T3-4: Posterior disc protrusion causes small indentation of the
thecal sac without significant spinal canal or neural foraminal
stenosis.

T4-5: No spinal canal or neural foraminal stenosis.

T5-6: No spinal canal or neural foraminal stenosis.

T6-7: No spinal canal or neural foraminal stenosis.

T7-8: No spinal canal or neural foraminal stenosis.

T8-9: No spinal canal or neural foraminal stenosis.

T9-10: No spinal canal or neural foraminal stenosis.

T10-11: No spinal canal or neural foraminal stenosis.

T11-12: No spinal canal or neural foraminal stenosis.

MRI LUMBAR SPINE FINDINGS

Segmentation:  Standard.

Alignment:  Minimal retrolisthesis of L1 over L2-L2 over L3.

Vertebrae:  No fracture, evidence of discitis, or bone lesion.

Conus medullaris and cauda equina: Conus extends to the L1 level.
Conus and cauda equina appear normal.

Paraspinal and other soft tissues: Negative.

Disc levels:

T12-L1: No spinal canal or neural foraminal stenosis.

L1-2: Minimal disc bulge and mild facet degenerative changes without
significant spinal canal or neural foraminal stenosis.

L2-3: Shallow disc bulge, facet degenerative changes ligamentum
flavum redundancy resulting in narrowing of the subarticular zones,
left greater than right and mild bilateral neural foraminal
narrowing.

L3-4: Shallow disc bulge, moderate facet degenerative changes and
ligamentum flavum redundancy resulting in mild spinal canal
stenosis, mild right and moderate left neural foraminal narrowing.

L4-5: Disc bulge, facet degenerative changes and ligamentum flavum
redundancy resulting in mild spinal canal stenosis with narrowing of
the bilateral subarticular zones and mild to moderate bilateral
neural foraminal narrowing.

L5-S1: Right central posterior disc osteophyte complex causing mild
narrowing of the right subarticular zones. There also moderate facet
degenerative changes. No neural foraminal narrowing.
IMPRESSION: 1. No acute osseous abnormality.
2. Mild multilevel degenerative changes of the thoracic and lumbar
spine with mild spinal canal stenosis at L3-L4 and L4-L5 .
3. Moderate left neural foraminal narrowing at L3-4 and mild to
moderate bilateral neural foraminal narrowing at L4-L5.
4. Small bilateral pleural effusion.

## 2020-03-06 NOTE — Progress Notes (Signed)
Pt terminated exam and refused post contrast imaging. Education was provided to the pt about importance of post contrast imaging. Pt still refused. MRI Thoracic and Lumbar without were completed.

## 2020-03-26 ENCOUNTER — Other Ambulatory Visit (INDEPENDENT_AMBULATORY_CARE_PROVIDER_SITE_OTHER): Payer: PPO

## 2020-03-26 ENCOUNTER — Ambulatory Visit (INDEPENDENT_AMBULATORY_CARE_PROVIDER_SITE_OTHER): Payer: PPO | Admitting: Gastroenterology

## 2020-03-26 ENCOUNTER — Encounter: Payer: Self-pay | Admitting: Gastroenterology

## 2020-03-26 VITALS — BP 122/74 | HR 57 | Ht 73.0 in | Wt 217.0 lb

## 2020-03-26 DIAGNOSIS — R197 Diarrhea, unspecified: Secondary | ICD-10-CM

## 2020-03-26 LAB — TSH: TSH: 0.48 u[IU]/mL (ref 0.35–4.50)

## 2020-03-26 LAB — IGA: IgA: 459 mg/dL — ABNORMAL HIGH (ref 68–378)

## 2020-03-26 NOTE — Patient Instructions (Signed)
If you are age 74 or older, your body mass index should be between 23-30. Your Body mass index is 28.63 kg/m. If this is out of the aforementioned range listed, please consider follow up with your Primary Care Provider.  If you are age 9 or younger, your body mass index should be between 19-25. Your Body mass index is 28.63 kg/m. If this is out of the aformentioned range listed, please consider follow up with your Primary Care Provider.   Your provider has requested that you go to the basement level for lab work before leaving today. Press "B" on the elevator. The lab is located at the first door on the left as you exit the elevator.  Due to recent changes in healthcare laws, you may see the results of your imaging and laboratory studies on MyChart before your provider has had a chance to review them.  We understand that in some cases there may be results that are confusing or concerning to you. Not all laboratory results come back in the same time frame and the provider may be waiting for multiple results in order to interpret others.  Please give [place] 48 hours in order for your provider to thoroughly review all the results before contacting the office for clarification of your results.   START a Lactose Free diet.  HOLD Miralax and Senna when patient has diarrhea.  Follow up pending lab results or as needed.

## 2020-03-26 NOTE — Progress Notes (Addendum)
03/26/2020 John Blackburn [id number] 10-15-1945   HISTORY OF PRESENT ILLNESS: This is a pleasant 74 year old male from [facility] [place].  He is here today with a CNA from the facility.  He is somewhat confused.  He tells me that he has been having diarrhea for the past 4 months or so since he has been at this facility and he thinks that it is related to something that he is eating there.  He has actually been at this facility for the past year, however.  He tells me that he has never had diarrhea before, but we saw him in 2018 and performed a colonoscopy for complaints of loose and urgent stools.  Colonoscopy by Dr. Ardis Hughs showed only diverticulosis.  Random biopsies were performed throughout the colon to rule out microscopic colitis and those were negative/normal.  He tells me that he does not have diarrhea every day.  Some days he does not have a bowel movement at all and some days he has normal stools.  When he does have diarrhea it is 2-3 episodes per day per his report.  He denies any abdominal pain.  He denies any rectal bleeding.  He says that his appetite is good.  MiraLAX and senna are both listed on his med rec forms.   Past Medical History:  Diagnosis Date  . Allergy   . 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 CAD (coronary artery disease)    08/2017 PCI/DES mRCA, RPDA, CTO pf pLCX with collaterals, normal EF  . Depression    on multiple meds. Has been seen at [facility]. and Dr. Sabra Heck is his prescriber  . Diverticulitis of colon 2000 's   treated as an outpatient  . GERD (gastroesophageal reflux disease)    UGI done August '12 - ulcer/. Dr. Ferdinand Lango, gastroenterologist in [facility].   [name] Hx of adenomatous colonic polyps   . Hyperlipidemia    last lipid panel: HDL 44, LDL 117  . Hypertension   . Myocardial infarction ([place])   . Peptic ulcer    in the past and just recently-August '12  . Pulmonary emboli [facility])    noted  October 2014 - treated by Dr. Linda Hedges  . skin cancer    skin CA  . Sleep apnea, primary central    wears CPAP   Past Surgical History:  Procedure Laterality Date  . ANAL RECTAL MANOMETRY N/A 11/30/2016   Procedure: ANO RECTAL MANOMETRY;  Surgeon: Mauri Pole, MD;  Location: [facility];  Service: Endoscopy;  Laterality: N/A;  . CARDIAC CATHETERIZATION    . CHOLECYSTECTOMY  1990   laproscopic   . CORONARY STENT INTERVENTION N/A 09/01/2017   Procedure: CORONARY STENT INTERVENTION;  Surgeon: Jettie Booze, MD;  Location: [place] [facility];  Service: Cardiovascular;  Laterality: N/A;  . HERNIA REPAIR    . JOINT REPLACEMENT     knee  . PROSTATE SURGERY     needle biopsy's, TURP  . RIGHT/LEFT HEART CATH AND CORONARY ANGIOGRAPHY N/A 09/01/2017   Procedure: RIGHT/LEFT HEART CATH AND CORONARY ANGIOGRAPHY;  Surgeon: Jettie Booze, MD;  Location: [place] [facility];  Service: Cardiovascular;  Laterality: N/A;  . TKR bilateral  2006   Dr. Violet Baldy  . UPPER GASTROINTESTINAL ENDOSCOPY    . VASECTOMY      reports that he has never smoked. He has never used smokeless tobacco. He reports current alcohol use of about 1.0  standard drink of alcohol per week. He reports that he does not use drugs. family history includes Colon cancer in his cousin, cousin, father, paternal aunt, paternal uncle, paternal uncle, and sister; Heart disease in his father, mother, paternal grandfather, and paternal uncle; Hypertension in his mother; Stroke in his mother. Allergies  Allergen Reactions  . Cephalexin Rash  . Levofloxacin Rash      Outpatient Encounter Medications as of 03/26/2020  Medication Sig  . aspirin EC 81 MG EC tablet Take 1 tablet (81 mg total) by mouth daily.  [name] atorvastatin (LIPITOR) 40 MG tablet Take 40 mg by mouth daily at 6 PM.  . cholecalciferol (VITAMIN D) 25 MCG tablet Take 1 tablet (1,000 Units total) by mouth daily.  . clopidogrel (PLAVIX) 75 MG tablet Take 1  tablet (75 mg total) by mouth daily. Patient will be scheduled for AUGUST 2020. (Patient taking differently: Take 75 mg by mouth daily. )  . escitalopram (LEXAPRO) 10 MG tablet Take 1 tablet by mouth once daily (Patient taking differently: Take 10 mg by mouth daily. )  . guaiFENesin (MUCINEX) 600 MG 12 hr tablet Take 1 tablet (600 mg total) by mouth 2 (two) times daily.  [name] lactose free nutrition (BOOST) LIQD Take 237 mLs by mouth 2 (two) times daily between meals.  [name] loratadine (CLARITIN) 10 MG tablet Take 1 tablet (10 mg total) by mouth daily as needed for allergies.  . Melatonin 3 MG TABS Take 3 mg by mouth at bedtime.  . memantine (NAMENDA XR) 7 MG CP24 24 hr capsule TAKE 1  BY MOUTH ONCE DAILY (Patient taking differently: Take 7 mg by mouth daily. )  . nitroGLYCERIN (NITROSTAT) 0.4 MG SL tablet Place 1 tablet (0.4 mg total) under the tongue every 5 (five) minutes as needed. (Patient taking differently: Place 0.4 mg under the tongue every 5 (five) minutes x 2 doses as needed for chest pain. )  . pantoprazole (PROTONIX) 40 MG tablet Take 1 tablet (40 mg total) by mouth daily. Patient will be scheduled for AUGUST 2020. (Patient taking differently: Take 40 mg by mouth daily. )  . polyethylene glycol powder (GLYCOLAX/MIRALAX) 17 GM/SCOOP powder Take 17 g by mouth daily. MIX INTO 4-8 OUNCES OF LIQUID  . PRESCRIPTION MEDICATION See admin instructions. CPAP- At bedtime (remove each morning)  . senna-docusate (SENOKOT-S) 8.6-50 MG tablet Take 1 tablet by mouth 2 (two) times daily as needed for mild constipation. (Patient taking differently: Take 1 tablet by mouth at bedtime. )  . vitamin B-12 (CYANOCOBALAMIN) 1000 MCG tablet Take 1,000 mcg by mouth daily.  [name] amLODipine (NORVASC) 5 MG tablet Take 1 tablet (5 mg total) by mouth daily.   No facility-administered encounter medications on file as of 03/26/2020.     REVIEW OF SYSTEMS  : All other systems reviewed and negative except where noted in the History  of Present Illness.   PHYSICAL EXAM: BP 122/74   Pulse 57   Ht 6\' 1"  (1.854 m)   Wt (!) 217 lb (98.4 kg)   BMI 28.63 kg/m  General: Well developed white male in no acute distress Head: Normocephalic and atraumatic Eyes:  Sclerae anicteric, conjunctiva pink. Ears: Normal auditory acuity Lungs: Clear throughout to auscultation; no increased WOB. Heart: Regular rate and rhythm; no M/R/G. Abdomen: Soft, non-distended.  BS present.  Non-tender. Musculoskeletal: Symmetrical with no gross deformities  Skin: No lesions on visible extremities Extremities: No edema  Neurological: Alert oriented x 4, grossly non-focal Psychological:  Alert and  cooperative. Normal mood and affect  ASSESSMENT AND PLAN: *Intermittent diarrhea: Patient reports that this is a new issue since he has lived at his current facility for the past few months, but he has lived at this facility for the past year.  We have seen him for complaints of diarrhea in the past and he underwent colonoscopy in February 2018 for these complaints.  He thinks that it is related to something that he is eating at the facility.  He does not have diarrhea every day, sometimes does not even have a bowel movement every day so I doubt infectious or medication related.  We will check a TSH and celiac labs today.  I am going to ask that the facility place him on a lactose-free diet for a while to see how his symptoms respond to that.  I am also asking them to please make sure that they hold his MiraLAX and his senna that are listed on his med rec form when he does in fact have diarrhea.   CC:  Hoyt Koch, *

## 2020-03-27 LAB — TISSUE TRANSGLUTAMINASE, IGA: (tTG) Ab, IgA: 1 U/mL

## 2020-03-27 NOTE — Progress Notes (Signed)
I agree with the above note, plan 

## 2020-05-04 DIAGNOSIS — I82409 Acute embolism and thrombosis of unspecified deep veins of unspecified lower extremity: Secondary | ICD-10-CM | POA: Diagnosis not present

## 2020-05-04 DIAGNOSIS — I2510 Atherosclerotic heart disease of native coronary artery without angina pectoris: Secondary | ICD-10-CM | POA: Diagnosis not present

## 2020-05-04 DIAGNOSIS — I5032 Chronic diastolic (congestive) heart failure: Secondary | ICD-10-CM | POA: Diagnosis not present

## 2020-05-04 DIAGNOSIS — I10 Essential (primary) hypertension: Secondary | ICD-10-CM | POA: Diagnosis not present

## 2020-05-08 DIAGNOSIS — E785 Hyperlipidemia, unspecified: Secondary | ICD-10-CM | POA: Diagnosis not present

## 2020-05-25 DIAGNOSIS — F0150 Vascular dementia without behavioral disturbance: Secondary | ICD-10-CM | POA: Diagnosis not present

## 2020-05-25 DIAGNOSIS — R4689 Other symptoms and signs involving appearance and behavior: Secondary | ICD-10-CM | POA: Diagnosis not present

## 2020-06-18 DIAGNOSIS — Z20822 Contact with and (suspected) exposure to covid-19: Secondary | ICD-10-CM | POA: Diagnosis not present

## 2020-06-22 DIAGNOSIS — Z20822 Contact with and (suspected) exposure to covid-19: Secondary | ICD-10-CM | POA: Diagnosis not present

## 2020-06-22 DIAGNOSIS — F0150 Vascular dementia without behavioral disturbance: Secondary | ICD-10-CM | POA: Diagnosis not present

## 2020-06-22 DIAGNOSIS — I2510 Atherosclerotic heart disease of native coronary artery without angina pectoris: Secondary | ICD-10-CM | POA: Diagnosis not present

## 2020-06-22 DIAGNOSIS — R1319 Other dysphagia: Secondary | ICD-10-CM | POA: Diagnosis not present

## 2020-06-22 DIAGNOSIS — R2689 Other abnormalities of gait and mobility: Secondary | ICD-10-CM | POA: Diagnosis not present

## 2020-06-22 DIAGNOSIS — I119 Hypertensive heart disease without heart failure: Secondary | ICD-10-CM | POA: Diagnosis not present

## 2020-06-22 DIAGNOSIS — D6859 Other primary thrombophilia: Secondary | ICD-10-CM | POA: Diagnosis not present

## 2020-06-22 DIAGNOSIS — I5032 Chronic diastolic (congestive) heart failure: Secondary | ICD-10-CM | POA: Diagnosis not present

## 2020-06-22 DIAGNOSIS — K279 Peptic ulcer, site unspecified, unspecified as acute or chronic, without hemorrhage or perforation: Secondary | ICD-10-CM | POA: Diagnosis not present

## 2020-06-22 DIAGNOSIS — G4733 Obstructive sleep apnea (adult) (pediatric): Secondary | ICD-10-CM | POA: Diagnosis not present

## 2020-06-29 DIAGNOSIS — Z20822 Contact with and (suspected) exposure to covid-19: Secondary | ICD-10-CM | POA: Diagnosis not present

## 2020-07-10 DIAGNOSIS — R1310 Dysphagia, unspecified: Secondary | ICD-10-CM | POA: Diagnosis not present

## 2020-07-10 DIAGNOSIS — G4733 Obstructive sleep apnea (adult) (pediatric): Secondary | ICD-10-CM | POA: Diagnosis not present

## 2020-07-10 DIAGNOSIS — I679 Cerebrovascular disease, unspecified: Secondary | ICD-10-CM | POA: Diagnosis not present

## 2020-07-10 DIAGNOSIS — I5032 Chronic diastolic (congestive) heart failure: Secondary | ICD-10-CM | POA: Diagnosis not present

## 2020-07-13 DIAGNOSIS — Z20822 Contact with and (suspected) exposure to covid-19: Secondary | ICD-10-CM | POA: Diagnosis not present

## 2020-07-16 DIAGNOSIS — I2699 Other pulmonary embolism without acute cor pulmonale: Secondary | ICD-10-CM | POA: Diagnosis not present

## 2020-07-16 DIAGNOSIS — F322 Major depressive disorder, single episode, severe without psychotic features: Secondary | ICD-10-CM | POA: Diagnosis not present

## 2020-07-16 DIAGNOSIS — R269 Unspecified abnormalities of gait and mobility: Secondary | ICD-10-CM | POA: Diagnosis not present

## 2020-07-16 DIAGNOSIS — I10 Essential (primary) hypertension: Secondary | ICD-10-CM | POA: Diagnosis not present

## 2020-07-16 DIAGNOSIS — I5030 Unspecified diastolic (congestive) heart failure: Secondary | ICD-10-CM | POA: Diagnosis not present

## 2020-07-16 DIAGNOSIS — I639 Cerebral infarction, unspecified: Secondary | ICD-10-CM | POA: Diagnosis not present

## 2020-07-16 DIAGNOSIS — I2510 Atherosclerotic heart disease of native coronary artery without angina pectoris: Secondary | ICD-10-CM | POA: Diagnosis not present

## 2020-07-16 DIAGNOSIS — I872 Venous insufficiency (chronic) (peripheral): Secondary | ICD-10-CM | POA: Diagnosis not present

## 2020-07-16 DIAGNOSIS — F0151 Vascular dementia with behavioral disturbance: Secondary | ICD-10-CM | POA: Diagnosis not present

## 2020-07-20 DIAGNOSIS — Z20822 Contact with and (suspected) exposure to covid-19: Secondary | ICD-10-CM | POA: Diagnosis not present

## 2020-07-27 DIAGNOSIS — Z20822 Contact with and (suspected) exposure to covid-19: Secondary | ICD-10-CM | POA: Diagnosis not present

## 2020-07-29 DIAGNOSIS — F0150 Vascular dementia without behavioral disturbance: Secondary | ICD-10-CM | POA: Diagnosis not present

## 2020-07-29 DIAGNOSIS — R278 Other lack of coordination: Secondary | ICD-10-CM | POA: Diagnosis not present

## 2020-07-29 DIAGNOSIS — I6930 Unspecified sequelae of cerebral infarction: Secondary | ICD-10-CM | POA: Diagnosis not present

## 2020-08-01 DIAGNOSIS — F0150 Vascular dementia without behavioral disturbance: Secondary | ICD-10-CM | POA: Diagnosis not present

## 2020-08-01 DIAGNOSIS — I6930 Unspecified sequelae of cerebral infarction: Secondary | ICD-10-CM | POA: Diagnosis not present

## 2020-08-01 DIAGNOSIS — R278 Other lack of coordination: Secondary | ICD-10-CM | POA: Diagnosis not present

## 2020-08-03 DIAGNOSIS — F0150 Vascular dementia without behavioral disturbance: Secondary | ICD-10-CM | POA: Diagnosis not present

## 2020-08-03 DIAGNOSIS — I6930 Unspecified sequelae of cerebral infarction: Secondary | ICD-10-CM | POA: Diagnosis not present

## 2020-08-03 DIAGNOSIS — R278 Other lack of coordination: Secondary | ICD-10-CM | POA: Diagnosis not present

## 2020-08-04 DIAGNOSIS — F0150 Vascular dementia without behavioral disturbance: Secondary | ICD-10-CM | POA: Diagnosis not present

## 2020-08-04 DIAGNOSIS — I6930 Unspecified sequelae of cerebral infarction: Secondary | ICD-10-CM | POA: Diagnosis not present

## 2020-08-04 DIAGNOSIS — R278 Other lack of coordination: Secondary | ICD-10-CM | POA: Diagnosis not present

## 2020-08-05 DIAGNOSIS — R278 Other lack of coordination: Secondary | ICD-10-CM | POA: Diagnosis not present

## 2020-08-05 DIAGNOSIS — I6930 Unspecified sequelae of cerebral infarction: Secondary | ICD-10-CM | POA: Diagnosis not present

## 2020-08-05 DIAGNOSIS — F0150 Vascular dementia without behavioral disturbance: Secondary | ICD-10-CM | POA: Diagnosis not present

## 2020-08-06 DIAGNOSIS — R278 Other lack of coordination: Secondary | ICD-10-CM | POA: Diagnosis not present

## 2020-08-06 DIAGNOSIS — F0150 Vascular dementia without behavioral disturbance: Secondary | ICD-10-CM | POA: Diagnosis not present

## 2020-08-06 DIAGNOSIS — I6930 Unspecified sequelae of cerebral infarction: Secondary | ICD-10-CM | POA: Diagnosis not present

## 2020-08-07 DIAGNOSIS — I6930 Unspecified sequelae of cerebral infarction: Secondary | ICD-10-CM | POA: Diagnosis not present

## 2020-08-07 DIAGNOSIS — F0150 Vascular dementia without behavioral disturbance: Secondary | ICD-10-CM | POA: Diagnosis not present

## 2020-08-07 DIAGNOSIS — R278 Other lack of coordination: Secondary | ICD-10-CM | POA: Diagnosis not present

## 2020-08-10 DIAGNOSIS — F0150 Vascular dementia without behavioral disturbance: Secondary | ICD-10-CM | POA: Diagnosis not present

## 2020-08-10 DIAGNOSIS — R278 Other lack of coordination: Secondary | ICD-10-CM | POA: Diagnosis not present

## 2020-08-10 DIAGNOSIS — I6930 Unspecified sequelae of cerebral infarction: Secondary | ICD-10-CM | POA: Diagnosis not present

## 2020-08-11 DIAGNOSIS — R278 Other lack of coordination: Secondary | ICD-10-CM | POA: Diagnosis not present

## 2020-08-11 DIAGNOSIS — F0150 Vascular dementia without behavioral disturbance: Secondary | ICD-10-CM | POA: Diagnosis not present

## 2020-08-11 DIAGNOSIS — I6930 Unspecified sequelae of cerebral infarction: Secondary | ICD-10-CM | POA: Diagnosis not present

## 2020-08-12 DIAGNOSIS — R278 Other lack of coordination: Secondary | ICD-10-CM | POA: Diagnosis not present

## 2020-08-12 DIAGNOSIS — F0150 Vascular dementia without behavioral disturbance: Secondary | ICD-10-CM | POA: Diagnosis not present

## 2020-08-12 DIAGNOSIS — I6930 Unspecified sequelae of cerebral infarction: Secondary | ICD-10-CM | POA: Diagnosis not present

## 2020-08-13 DIAGNOSIS — I639 Cerebral infarction, unspecified: Secondary | ICD-10-CM | POA: Diagnosis not present

## 2020-08-13 DIAGNOSIS — F0151 Vascular dementia with behavioral disturbance: Secondary | ICD-10-CM | POA: Diagnosis not present

## 2020-08-13 DIAGNOSIS — F322 Major depressive disorder, single episode, severe without psychotic features: Secondary | ICD-10-CM | POA: Diagnosis not present

## 2020-08-13 DIAGNOSIS — R269 Unspecified abnormalities of gait and mobility: Secondary | ICD-10-CM | POA: Diagnosis not present

## 2020-08-13 DIAGNOSIS — I5030 Unspecified diastolic (congestive) heart failure: Secondary | ICD-10-CM | POA: Diagnosis not present

## 2020-08-13 DIAGNOSIS — I872 Venous insufficiency (chronic) (peripheral): Secondary | ICD-10-CM | POA: Diagnosis not present

## 2020-08-13 DIAGNOSIS — I6930 Unspecified sequelae of cerebral infarction: Secondary | ICD-10-CM | POA: Diagnosis not present

## 2020-08-13 DIAGNOSIS — I2699 Other pulmonary embolism without acute cor pulmonale: Secondary | ICD-10-CM | POA: Diagnosis not present

## 2020-08-13 DIAGNOSIS — I10 Essential (primary) hypertension: Secondary | ICD-10-CM | POA: Diagnosis not present

## 2020-08-13 DIAGNOSIS — F0150 Vascular dementia without behavioral disturbance: Secondary | ICD-10-CM | POA: Diagnosis not present

## 2020-08-13 DIAGNOSIS — R278 Other lack of coordination: Secondary | ICD-10-CM | POA: Diagnosis not present

## 2020-08-13 DIAGNOSIS — I2510 Atherosclerotic heart disease of native coronary artery without angina pectoris: Secondary | ICD-10-CM | POA: Diagnosis not present

## 2020-08-14 DIAGNOSIS — F0150 Vascular dementia without behavioral disturbance: Secondary | ICD-10-CM | POA: Diagnosis not present

## 2020-08-14 DIAGNOSIS — I6930 Unspecified sequelae of cerebral infarction: Secondary | ICD-10-CM | POA: Diagnosis not present

## 2020-08-14 DIAGNOSIS — R278 Other lack of coordination: Secondary | ICD-10-CM | POA: Diagnosis not present

## 2020-08-17 DIAGNOSIS — R278 Other lack of coordination: Secondary | ICD-10-CM | POA: Diagnosis not present

## 2020-08-17 DIAGNOSIS — I6930 Unspecified sequelae of cerebral infarction: Secondary | ICD-10-CM | POA: Diagnosis not present

## 2020-08-17 DIAGNOSIS — F0150 Vascular dementia without behavioral disturbance: Secondary | ICD-10-CM | POA: Diagnosis not present

## 2020-08-19 DIAGNOSIS — F0150 Vascular dementia without behavioral disturbance: Secondary | ICD-10-CM | POA: Diagnosis not present

## 2020-08-19 DIAGNOSIS — I6930 Unspecified sequelae of cerebral infarction: Secondary | ICD-10-CM | POA: Diagnosis not present

## 2020-08-19 DIAGNOSIS — R278 Other lack of coordination: Secondary | ICD-10-CM | POA: Diagnosis not present

## 2020-08-20 DIAGNOSIS — F0150 Vascular dementia without behavioral disturbance: Secondary | ICD-10-CM | POA: Diagnosis not present

## 2020-08-20 DIAGNOSIS — R278 Other lack of coordination: Secondary | ICD-10-CM | POA: Diagnosis not present

## 2020-08-20 DIAGNOSIS — I6930 Unspecified sequelae of cerebral infarction: Secondary | ICD-10-CM | POA: Diagnosis not present

## 2020-08-21 DIAGNOSIS — I6930 Unspecified sequelae of cerebral infarction: Secondary | ICD-10-CM | POA: Diagnosis not present

## 2020-08-21 DIAGNOSIS — F0150 Vascular dementia without behavioral disturbance: Secondary | ICD-10-CM | POA: Diagnosis not present

## 2020-08-21 DIAGNOSIS — R278 Other lack of coordination: Secondary | ICD-10-CM | POA: Diagnosis not present

## 2020-08-22 DIAGNOSIS — R278 Other lack of coordination: Secondary | ICD-10-CM | POA: Diagnosis not present

## 2020-08-22 DIAGNOSIS — I6930 Unspecified sequelae of cerebral infarction: Secondary | ICD-10-CM | POA: Diagnosis not present

## 2020-08-22 DIAGNOSIS — F0150 Vascular dementia without behavioral disturbance: Secondary | ICD-10-CM | POA: Diagnosis not present

## 2020-08-24 DIAGNOSIS — R278 Other lack of coordination: Secondary | ICD-10-CM | POA: Diagnosis not present

## 2020-08-24 DIAGNOSIS — F0150 Vascular dementia without behavioral disturbance: Secondary | ICD-10-CM | POA: Diagnosis not present

## 2020-08-24 DIAGNOSIS — I6930 Unspecified sequelae of cerebral infarction: Secondary | ICD-10-CM | POA: Diagnosis not present

## 2020-08-24 DIAGNOSIS — Z20822 Contact with and (suspected) exposure to covid-19: Secondary | ICD-10-CM | POA: Diagnosis not present

## 2020-08-25 DIAGNOSIS — I6930 Unspecified sequelae of cerebral infarction: Secondary | ICD-10-CM | POA: Diagnosis not present

## 2020-08-25 DIAGNOSIS — R278 Other lack of coordination: Secondary | ICD-10-CM | POA: Diagnosis not present

## 2020-08-25 DIAGNOSIS — F0150 Vascular dementia without behavioral disturbance: Secondary | ICD-10-CM | POA: Diagnosis not present

## 2020-08-26 DIAGNOSIS — I6930 Unspecified sequelae of cerebral infarction: Secondary | ICD-10-CM | POA: Diagnosis not present

## 2020-08-26 DIAGNOSIS — R278 Other lack of coordination: Secondary | ICD-10-CM | POA: Diagnosis not present

## 2020-08-26 DIAGNOSIS — F0150 Vascular dementia without behavioral disturbance: Secondary | ICD-10-CM | POA: Diagnosis not present

## 2020-08-27 DIAGNOSIS — R278 Other lack of coordination: Secondary | ICD-10-CM | POA: Diagnosis not present

## 2020-08-27 DIAGNOSIS — F0150 Vascular dementia without behavioral disturbance: Secondary | ICD-10-CM | POA: Diagnosis not present

## 2020-08-27 DIAGNOSIS — I6930 Unspecified sequelae of cerebral infarction: Secondary | ICD-10-CM | POA: Diagnosis not present

## 2020-08-28 DIAGNOSIS — F0150 Vascular dementia without behavioral disturbance: Secondary | ICD-10-CM | POA: Diagnosis not present

## 2020-08-28 DIAGNOSIS — R278 Other lack of coordination: Secondary | ICD-10-CM | POA: Diagnosis not present

## 2020-08-28 DIAGNOSIS — I6930 Unspecified sequelae of cerebral infarction: Secondary | ICD-10-CM | POA: Diagnosis not present

## 2020-08-31 DIAGNOSIS — Z20822 Contact with and (suspected) exposure to covid-19: Secondary | ICD-10-CM | POA: Diagnosis not present

## 2020-08-31 DIAGNOSIS — F0150 Vascular dementia without behavioral disturbance: Secondary | ICD-10-CM | POA: Diagnosis not present

## 2020-08-31 DIAGNOSIS — I6930 Unspecified sequelae of cerebral infarction: Secondary | ICD-10-CM | POA: Diagnosis not present

## 2020-08-31 DIAGNOSIS — R278 Other lack of coordination: Secondary | ICD-10-CM | POA: Diagnosis not present

## 2020-09-01 DIAGNOSIS — I6930 Unspecified sequelae of cerebral infarction: Secondary | ICD-10-CM | POA: Diagnosis not present

## 2020-09-01 DIAGNOSIS — R278 Other lack of coordination: Secondary | ICD-10-CM | POA: Diagnosis not present

## 2020-09-01 DIAGNOSIS — F0150 Vascular dementia without behavioral disturbance: Secondary | ICD-10-CM | POA: Diagnosis not present

## 2020-09-02 DIAGNOSIS — I6930 Unspecified sequelae of cerebral infarction: Secondary | ICD-10-CM | POA: Diagnosis not present

## 2020-09-02 DIAGNOSIS — F0150 Vascular dementia without behavioral disturbance: Secondary | ICD-10-CM | POA: Diagnosis not present

## 2020-09-02 DIAGNOSIS — R278 Other lack of coordination: Secondary | ICD-10-CM | POA: Diagnosis not present

## 2020-09-03 DIAGNOSIS — F0150 Vascular dementia without behavioral disturbance: Secondary | ICD-10-CM | POA: Diagnosis not present

## 2020-09-03 DIAGNOSIS — I6930 Unspecified sequelae of cerebral infarction: Secondary | ICD-10-CM | POA: Diagnosis not present

## 2020-09-03 DIAGNOSIS — R278 Other lack of coordination: Secondary | ICD-10-CM | POA: Diagnosis not present

## 2020-09-04 DIAGNOSIS — F0150 Vascular dementia without behavioral disturbance: Secondary | ICD-10-CM | POA: Diagnosis not present

## 2020-09-04 DIAGNOSIS — R278 Other lack of coordination: Secondary | ICD-10-CM | POA: Diagnosis not present

## 2020-09-04 DIAGNOSIS — I6930 Unspecified sequelae of cerebral infarction: Secondary | ICD-10-CM | POA: Diagnosis not present

## 2020-09-07 DIAGNOSIS — R278 Other lack of coordination: Secondary | ICD-10-CM | POA: Diagnosis not present

## 2020-09-07 DIAGNOSIS — I6930 Unspecified sequelae of cerebral infarction: Secondary | ICD-10-CM | POA: Diagnosis not present

## 2020-09-07 DIAGNOSIS — F0150 Vascular dementia without behavioral disturbance: Secondary | ICD-10-CM | POA: Diagnosis not present

## 2020-09-07 DIAGNOSIS — Z20822 Contact with and (suspected) exposure to covid-19: Secondary | ICD-10-CM | POA: Diagnosis not present

## 2020-09-24 DIAGNOSIS — R269 Unspecified abnormalities of gait and mobility: Secondary | ICD-10-CM | POA: Diagnosis not present

## 2020-09-24 DIAGNOSIS — I2510 Atherosclerotic heart disease of native coronary artery without angina pectoris: Secondary | ICD-10-CM | POA: Diagnosis not present

## 2020-09-24 DIAGNOSIS — I2699 Other pulmonary embolism without acute cor pulmonale: Secondary | ICD-10-CM | POA: Diagnosis not present

## 2020-09-24 DIAGNOSIS — I10 Essential (primary) hypertension: Secondary | ICD-10-CM | POA: Diagnosis not present

## 2020-09-24 DIAGNOSIS — I872 Venous insufficiency (chronic) (peripheral): Secondary | ICD-10-CM | POA: Diagnosis not present

## 2020-09-24 DIAGNOSIS — I5030 Unspecified diastolic (congestive) heart failure: Secondary | ICD-10-CM | POA: Diagnosis not present

## 2020-09-24 DIAGNOSIS — I639 Cerebral infarction, unspecified: Secondary | ICD-10-CM | POA: Diagnosis not present

## 2020-09-24 DIAGNOSIS — F0151 Vascular dementia with behavioral disturbance: Secondary | ICD-10-CM | POA: Diagnosis not present

## 2020-09-24 DIAGNOSIS — F322 Major depressive disorder, single episode, severe without psychotic features: Secondary | ICD-10-CM | POA: Diagnosis not present

## 2020-09-29 DIAGNOSIS — Z20822 Contact with and (suspected) exposure to covid-19: Secondary | ICD-10-CM | POA: Diagnosis not present

## 2020-10-07 DIAGNOSIS — I6930 Unspecified sequelae of cerebral infarction: Secondary | ICD-10-CM | POA: Diagnosis not present

## 2020-10-07 DIAGNOSIS — M6281 Muscle weakness (generalized): Secondary | ICD-10-CM | POA: Diagnosis not present

## 2020-10-07 DIAGNOSIS — F0150 Vascular dementia without behavioral disturbance: Secondary | ICD-10-CM | POA: Diagnosis not present

## 2020-10-07 DIAGNOSIS — R262 Difficulty in walking, not elsewhere classified: Secondary | ICD-10-CM | POA: Diagnosis not present

## 2020-10-07 DIAGNOSIS — M1990 Unspecified osteoarthritis, unspecified site: Secondary | ICD-10-CM | POA: Diagnosis not present

## 2020-10-08 DIAGNOSIS — M1990 Unspecified osteoarthritis, unspecified site: Secondary | ICD-10-CM | POA: Diagnosis not present

## 2020-10-08 DIAGNOSIS — I6930 Unspecified sequelae of cerebral infarction: Secondary | ICD-10-CM | POA: Diagnosis not present

## 2020-10-08 DIAGNOSIS — F0150 Vascular dementia without behavioral disturbance: Secondary | ICD-10-CM | POA: Diagnosis not present

## 2020-10-08 DIAGNOSIS — R262 Difficulty in walking, not elsewhere classified: Secondary | ICD-10-CM | POA: Diagnosis not present

## 2020-10-08 DIAGNOSIS — M6281 Muscle weakness (generalized): Secondary | ICD-10-CM | POA: Diagnosis not present

## 2020-10-09 DIAGNOSIS — M6281 Muscle weakness (generalized): Secondary | ICD-10-CM | POA: Diagnosis not present

## 2020-10-09 DIAGNOSIS — F0150 Vascular dementia without behavioral disturbance: Secondary | ICD-10-CM | POA: Diagnosis not present

## 2020-10-09 DIAGNOSIS — R262 Difficulty in walking, not elsewhere classified: Secondary | ICD-10-CM | POA: Diagnosis not present

## 2020-10-09 DIAGNOSIS — I6930 Unspecified sequelae of cerebral infarction: Secondary | ICD-10-CM | POA: Diagnosis not present

## 2020-10-09 DIAGNOSIS — M1990 Unspecified osteoarthritis, unspecified site: Secondary | ICD-10-CM | POA: Diagnosis not present

## 2020-10-12 DIAGNOSIS — M1990 Unspecified osteoarthritis, unspecified site: Secondary | ICD-10-CM | POA: Diagnosis not present

## 2020-10-12 DIAGNOSIS — F0150 Vascular dementia without behavioral disturbance: Secondary | ICD-10-CM | POA: Diagnosis not present

## 2020-10-12 DIAGNOSIS — I6930 Unspecified sequelae of cerebral infarction: Secondary | ICD-10-CM | POA: Diagnosis not present

## 2020-10-12 DIAGNOSIS — M6281 Muscle weakness (generalized): Secondary | ICD-10-CM | POA: Diagnosis not present

## 2020-10-12 DIAGNOSIS — R262 Difficulty in walking, not elsewhere classified: Secondary | ICD-10-CM | POA: Diagnosis not present

## 2020-10-13 DIAGNOSIS — I6930 Unspecified sequelae of cerebral infarction: Secondary | ICD-10-CM | POA: Diagnosis not present

## 2020-10-13 DIAGNOSIS — M1990 Unspecified osteoarthritis, unspecified site: Secondary | ICD-10-CM | POA: Diagnosis not present

## 2020-10-13 DIAGNOSIS — R262 Difficulty in walking, not elsewhere classified: Secondary | ICD-10-CM | POA: Diagnosis not present

## 2020-10-13 DIAGNOSIS — M6281 Muscle weakness (generalized): Secondary | ICD-10-CM | POA: Diagnosis not present

## 2020-10-13 DIAGNOSIS — F0150 Vascular dementia without behavioral disturbance: Secondary | ICD-10-CM | POA: Diagnosis not present

## 2020-10-14 DIAGNOSIS — M6281 Muscle weakness (generalized): Secondary | ICD-10-CM | POA: Diagnosis not present

## 2020-10-14 DIAGNOSIS — I6930 Unspecified sequelae of cerebral infarction: Secondary | ICD-10-CM | POA: Diagnosis not present

## 2020-10-14 DIAGNOSIS — M1990 Unspecified osteoarthritis, unspecified site: Secondary | ICD-10-CM | POA: Diagnosis not present

## 2020-10-14 DIAGNOSIS — R262 Difficulty in walking, not elsewhere classified: Secondary | ICD-10-CM | POA: Diagnosis not present

## 2020-10-14 DIAGNOSIS — F0150 Vascular dementia without behavioral disturbance: Secondary | ICD-10-CM | POA: Diagnosis not present

## 2020-10-15 DIAGNOSIS — M6281 Muscle weakness (generalized): Secondary | ICD-10-CM | POA: Diagnosis not present

## 2020-10-15 DIAGNOSIS — M1990 Unspecified osteoarthritis, unspecified site: Secondary | ICD-10-CM | POA: Diagnosis not present

## 2020-10-15 DIAGNOSIS — F0150 Vascular dementia without behavioral disturbance: Secondary | ICD-10-CM | POA: Diagnosis not present

## 2020-10-15 DIAGNOSIS — I6930 Unspecified sequelae of cerebral infarction: Secondary | ICD-10-CM | POA: Diagnosis not present

## 2020-10-15 DIAGNOSIS — R262 Difficulty in walking, not elsewhere classified: Secondary | ICD-10-CM | POA: Diagnosis not present

## 2020-10-16 DIAGNOSIS — I6930 Unspecified sequelae of cerebral infarction: Secondary | ICD-10-CM | POA: Diagnosis not present

## 2020-10-16 DIAGNOSIS — M1990 Unspecified osteoarthritis, unspecified site: Secondary | ICD-10-CM | POA: Diagnosis not present

## 2020-10-16 DIAGNOSIS — R262 Difficulty in walking, not elsewhere classified: Secondary | ICD-10-CM | POA: Diagnosis not present

## 2020-10-16 DIAGNOSIS — M6281 Muscle weakness (generalized): Secondary | ICD-10-CM | POA: Diagnosis not present

## 2020-10-16 DIAGNOSIS — F0150 Vascular dementia without behavioral disturbance: Secondary | ICD-10-CM | POA: Diagnosis not present

## 2020-10-19 DIAGNOSIS — F0150 Vascular dementia without behavioral disturbance: Secondary | ICD-10-CM | POA: Diagnosis not present

## 2020-10-19 DIAGNOSIS — M6281 Muscle weakness (generalized): Secondary | ICD-10-CM | POA: Diagnosis not present

## 2020-10-19 DIAGNOSIS — M1990 Unspecified osteoarthritis, unspecified site: Secondary | ICD-10-CM | POA: Diagnosis not present

## 2020-10-19 DIAGNOSIS — I6930 Unspecified sequelae of cerebral infarction: Secondary | ICD-10-CM | POA: Diagnosis not present

## 2020-10-19 DIAGNOSIS — R262 Difficulty in walking, not elsewhere classified: Secondary | ICD-10-CM | POA: Diagnosis not present

## 2020-10-20 DIAGNOSIS — R262 Difficulty in walking, not elsewhere classified: Secondary | ICD-10-CM | POA: Diagnosis not present

## 2020-10-20 DIAGNOSIS — I6930 Unspecified sequelae of cerebral infarction: Secondary | ICD-10-CM | POA: Diagnosis not present

## 2020-10-20 DIAGNOSIS — M1990 Unspecified osteoarthritis, unspecified site: Secondary | ICD-10-CM | POA: Diagnosis not present

## 2020-10-20 DIAGNOSIS — F0150 Vascular dementia without behavioral disturbance: Secondary | ICD-10-CM | POA: Diagnosis not present

## 2020-10-20 DIAGNOSIS — M6281 Muscle weakness (generalized): Secondary | ICD-10-CM | POA: Diagnosis not present

## 2020-10-21 DIAGNOSIS — I6930 Unspecified sequelae of cerebral infarction: Secondary | ICD-10-CM | POA: Diagnosis not present

## 2020-10-21 DIAGNOSIS — M1990 Unspecified osteoarthritis, unspecified site: Secondary | ICD-10-CM | POA: Diagnosis not present

## 2020-10-21 DIAGNOSIS — M6281 Muscle weakness (generalized): Secondary | ICD-10-CM | POA: Diagnosis not present

## 2020-10-21 DIAGNOSIS — R262 Difficulty in walking, not elsewhere classified: Secondary | ICD-10-CM | POA: Diagnosis not present

## 2020-10-21 DIAGNOSIS — F0150 Vascular dementia without behavioral disturbance: Secondary | ICD-10-CM | POA: Diagnosis not present

## 2020-10-22 DIAGNOSIS — M1990 Unspecified osteoarthritis, unspecified site: Secondary | ICD-10-CM | POA: Diagnosis not present

## 2020-10-22 DIAGNOSIS — R269 Unspecified abnormalities of gait and mobility: Secondary | ICD-10-CM | POA: Diagnosis not present

## 2020-10-22 DIAGNOSIS — I2699 Other pulmonary embolism without acute cor pulmonale: Secondary | ICD-10-CM | POA: Diagnosis not present

## 2020-10-22 DIAGNOSIS — I6930 Unspecified sequelae of cerebral infarction: Secondary | ICD-10-CM | POA: Diagnosis not present

## 2020-10-22 DIAGNOSIS — I10 Essential (primary) hypertension: Secondary | ICD-10-CM | POA: Diagnosis not present

## 2020-10-22 DIAGNOSIS — I872 Venous insufficiency (chronic) (peripheral): Secondary | ICD-10-CM | POA: Diagnosis not present

## 2020-10-22 DIAGNOSIS — F0151 Vascular dementia with behavioral disturbance: Secondary | ICD-10-CM | POA: Diagnosis not present

## 2020-10-22 DIAGNOSIS — F0150 Vascular dementia without behavioral disturbance: Secondary | ICD-10-CM | POA: Diagnosis not present

## 2020-10-22 DIAGNOSIS — I2510 Atherosclerotic heart disease of native coronary artery without angina pectoris: Secondary | ICD-10-CM | POA: Diagnosis not present

## 2020-10-22 DIAGNOSIS — M6281 Muscle weakness (generalized): Secondary | ICD-10-CM | POA: Diagnosis not present

## 2020-10-22 DIAGNOSIS — I5030 Unspecified diastolic (congestive) heart failure: Secondary | ICD-10-CM | POA: Diagnosis not present

## 2020-10-22 DIAGNOSIS — F322 Major depressive disorder, single episode, severe without psychotic features: Secondary | ICD-10-CM | POA: Diagnosis not present

## 2020-10-22 DIAGNOSIS — I639 Cerebral infarction, unspecified: Secondary | ICD-10-CM | POA: Diagnosis not present

## 2020-10-22 DIAGNOSIS — R262 Difficulty in walking, not elsewhere classified: Secondary | ICD-10-CM | POA: Diagnosis not present

## 2020-10-23 DIAGNOSIS — R262 Difficulty in walking, not elsewhere classified: Secondary | ICD-10-CM | POA: Diagnosis not present

## 2020-10-23 DIAGNOSIS — I6930 Unspecified sequelae of cerebral infarction: Secondary | ICD-10-CM | POA: Diagnosis not present

## 2020-10-23 DIAGNOSIS — M1990 Unspecified osteoarthritis, unspecified site: Secondary | ICD-10-CM | POA: Diagnosis not present

## 2020-10-23 DIAGNOSIS — M6281 Muscle weakness (generalized): Secondary | ICD-10-CM | POA: Diagnosis not present

## 2020-10-23 DIAGNOSIS — F0150 Vascular dementia without behavioral disturbance: Secondary | ICD-10-CM | POA: Diagnosis not present

## 2020-10-26 DIAGNOSIS — I6930 Unspecified sequelae of cerebral infarction: Secondary | ICD-10-CM | POA: Diagnosis not present

## 2020-10-26 DIAGNOSIS — M6281 Muscle weakness (generalized): Secondary | ICD-10-CM | POA: Diagnosis not present

## 2020-10-26 DIAGNOSIS — F0150 Vascular dementia without behavioral disturbance: Secondary | ICD-10-CM | POA: Diagnosis not present

## 2020-10-26 DIAGNOSIS — R262 Difficulty in walking, not elsewhere classified: Secondary | ICD-10-CM | POA: Diagnosis not present

## 2020-10-26 DIAGNOSIS — M1990 Unspecified osteoarthritis, unspecified site: Secondary | ICD-10-CM | POA: Diagnosis not present

## 2020-11-12 DIAGNOSIS — I2510 Atherosclerotic heart disease of native coronary artery without angina pectoris: Secondary | ICD-10-CM | POA: Diagnosis not present

## 2020-11-12 DIAGNOSIS — F322 Major depressive disorder, single episode, severe without psychotic features: Secondary | ICD-10-CM | POA: Diagnosis not present

## 2020-11-12 DIAGNOSIS — I5030 Unspecified diastolic (congestive) heart failure: Secondary | ICD-10-CM | POA: Diagnosis not present

## 2020-11-12 DIAGNOSIS — R269 Unspecified abnormalities of gait and mobility: Secondary | ICD-10-CM | POA: Diagnosis not present

## 2020-11-12 DIAGNOSIS — I872 Venous insufficiency (chronic) (peripheral): Secondary | ICD-10-CM | POA: Diagnosis not present

## 2020-11-12 DIAGNOSIS — F0151 Vascular dementia with behavioral disturbance: Secondary | ICD-10-CM | POA: Diagnosis not present

## 2020-11-12 DIAGNOSIS — I10 Essential (primary) hypertension: Secondary | ICD-10-CM | POA: Diagnosis not present

## 2020-11-12 DIAGNOSIS — I2699 Other pulmonary embolism without acute cor pulmonale: Secondary | ICD-10-CM | POA: Diagnosis not present

## 2020-11-12 DIAGNOSIS — I639 Cerebral infarction, unspecified: Secondary | ICD-10-CM | POA: Diagnosis not present

## 2020-12-17 DIAGNOSIS — I5030 Unspecified diastolic (congestive) heart failure: Secondary | ICD-10-CM | POA: Diagnosis not present

## 2020-12-17 DIAGNOSIS — I10 Essential (primary) hypertension: Secondary | ICD-10-CM | POA: Diagnosis not present

## 2020-12-17 DIAGNOSIS — I2699 Other pulmonary embolism without acute cor pulmonale: Secondary | ICD-10-CM | POA: Diagnosis not present

## 2020-12-17 DIAGNOSIS — R269 Unspecified abnormalities of gait and mobility: Secondary | ICD-10-CM | POA: Diagnosis not present

## 2020-12-17 DIAGNOSIS — I639 Cerebral infarction, unspecified: Secondary | ICD-10-CM | POA: Diagnosis not present

## 2020-12-17 DIAGNOSIS — F0151 Vascular dementia with behavioral disturbance: Secondary | ICD-10-CM | POA: Diagnosis not present

## 2020-12-17 DIAGNOSIS — I2510 Atherosclerotic heart disease of native coronary artery without angina pectoris: Secondary | ICD-10-CM | POA: Diagnosis not present

## 2020-12-17 DIAGNOSIS — I872 Venous insufficiency (chronic) (peripheral): Secondary | ICD-10-CM | POA: Diagnosis not present

## 2020-12-17 DIAGNOSIS — F322 Major depressive disorder, single episode, severe without psychotic features: Secondary | ICD-10-CM | POA: Diagnosis not present

## 2021-01-02 DIAGNOSIS — M6281 Muscle weakness (generalized): Secondary | ICD-10-CM | POA: Diagnosis not present

## 2021-01-02 DIAGNOSIS — R2681 Unsteadiness on feet: Secondary | ICD-10-CM | POA: Diagnosis not present

## 2021-01-02 DIAGNOSIS — I6930 Unspecified sequelae of cerebral infarction: Secondary | ICD-10-CM | POA: Diagnosis not present

## 2021-01-03 DIAGNOSIS — M6281 Muscle weakness (generalized): Secondary | ICD-10-CM | POA: Diagnosis not present

## 2021-01-05 DIAGNOSIS — M6281 Muscle weakness (generalized): Secondary | ICD-10-CM | POA: Diagnosis not present

## 2021-01-06 DIAGNOSIS — M6281 Muscle weakness (generalized): Secondary | ICD-10-CM | POA: Diagnosis not present

## 2021-01-07 DIAGNOSIS — M6281 Muscle weakness (generalized): Secondary | ICD-10-CM | POA: Diagnosis not present

## 2021-01-08 DIAGNOSIS — M6281 Muscle weakness (generalized): Secondary | ICD-10-CM | POA: Diagnosis not present

## 2021-01-11 DIAGNOSIS — I110 Hypertensive heart disease with heart failure: Secondary | ICD-10-CM | POA: Diagnosis not present

## 2021-01-11 DIAGNOSIS — E785 Hyperlipidemia, unspecified: Secondary | ICD-10-CM | POA: Diagnosis not present

## 2021-01-12 DIAGNOSIS — M6281 Muscle weakness (generalized): Secondary | ICD-10-CM | POA: Diagnosis not present

## 2021-01-13 DIAGNOSIS — M6281 Muscle weakness (generalized): Secondary | ICD-10-CM | POA: Diagnosis not present

## 2021-01-14 DIAGNOSIS — F0151 Vascular dementia with behavioral disturbance: Secondary | ICD-10-CM | POA: Diagnosis not present

## 2021-01-14 DIAGNOSIS — I639 Cerebral infarction, unspecified: Secondary | ICD-10-CM | POA: Diagnosis not present

## 2021-01-14 DIAGNOSIS — R269 Unspecified abnormalities of gait and mobility: Secondary | ICD-10-CM | POA: Diagnosis not present

## 2021-01-14 DIAGNOSIS — M6281 Muscle weakness (generalized): Secondary | ICD-10-CM | POA: Diagnosis not present

## 2021-01-14 DIAGNOSIS — I5030 Unspecified diastolic (congestive) heart failure: Secondary | ICD-10-CM | POA: Diagnosis not present

## 2021-01-15 DIAGNOSIS — M6281 Muscle weakness (generalized): Secondary | ICD-10-CM | POA: Diagnosis not present

## 2021-01-16 DIAGNOSIS — M6281 Muscle weakness (generalized): Secondary | ICD-10-CM | POA: Diagnosis not present

## 2021-01-18 DIAGNOSIS — M6281 Muscle weakness (generalized): Secondary | ICD-10-CM | POA: Diagnosis not present

## 2021-01-19 DIAGNOSIS — M6281 Muscle weakness (generalized): Secondary | ICD-10-CM | POA: Diagnosis not present

## 2021-01-20 DIAGNOSIS — M6281 Muscle weakness (generalized): Secondary | ICD-10-CM | POA: Diagnosis not present

## 2021-01-21 DIAGNOSIS — M6281 Muscle weakness (generalized): Secondary | ICD-10-CM | POA: Diagnosis not present

## 2021-01-22 DIAGNOSIS — M6281 Muscle weakness (generalized): Secondary | ICD-10-CM | POA: Diagnosis not present

## 2021-01-25 DIAGNOSIS — M6281 Muscle weakness (generalized): Secondary | ICD-10-CM | POA: Diagnosis not present

## 2021-01-26 DIAGNOSIS — M6281 Muscle weakness (generalized): Secondary | ICD-10-CM | POA: Diagnosis not present

## 2021-01-27 DIAGNOSIS — M6281 Muscle weakness (generalized): Secondary | ICD-10-CM | POA: Diagnosis not present

## 2021-01-28 DIAGNOSIS — M6281 Muscle weakness (generalized): Secondary | ICD-10-CM | POA: Diagnosis not present

## 2021-01-29 DIAGNOSIS — M6281 Muscle weakness (generalized): Secondary | ICD-10-CM | POA: Diagnosis not present

## 2021-02-01 DIAGNOSIS — M6281 Muscle weakness (generalized): Secondary | ICD-10-CM | POA: Diagnosis not present

## 2021-02-02 DIAGNOSIS — M6281 Muscle weakness (generalized): Secondary | ICD-10-CM | POA: Diagnosis not present

## 2021-02-03 DIAGNOSIS — M6281 Muscle weakness (generalized): Secondary | ICD-10-CM | POA: Diagnosis not present

## 2021-02-04 DIAGNOSIS — I5030 Unspecified diastolic (congestive) heart failure: Secondary | ICD-10-CM | POA: Diagnosis not present

## 2021-02-04 DIAGNOSIS — I10 Essential (primary) hypertension: Secondary | ICD-10-CM | POA: Diagnosis not present

## 2021-02-04 DIAGNOSIS — I2510 Atherosclerotic heart disease of native coronary artery without angina pectoris: Secondary | ICD-10-CM | POA: Diagnosis not present

## 2021-02-04 DIAGNOSIS — I872 Venous insufficiency (chronic) (peripheral): Secondary | ICD-10-CM | POA: Diagnosis not present

## 2021-02-04 DIAGNOSIS — M6281 Muscle weakness (generalized): Secondary | ICD-10-CM | POA: Diagnosis not present

## 2021-02-04 DIAGNOSIS — I2699 Other pulmonary embolism without acute cor pulmonale: Secondary | ICD-10-CM | POA: Diagnosis not present

## 2021-02-04 DIAGNOSIS — R269 Unspecified abnormalities of gait and mobility: Secondary | ICD-10-CM | POA: Diagnosis not present

## 2021-02-04 DIAGNOSIS — F0151 Vascular dementia with behavioral disturbance: Secondary | ICD-10-CM | POA: Diagnosis not present

## 2021-02-04 DIAGNOSIS — F322 Major depressive disorder, single episode, severe without psychotic features: Secondary | ICD-10-CM | POA: Diagnosis not present

## 2021-02-04 DIAGNOSIS — I639 Cerebral infarction, unspecified: Secondary | ICD-10-CM | POA: Diagnosis not present

## 2021-02-05 DIAGNOSIS — M6281 Muscle weakness (generalized): Secondary | ICD-10-CM | POA: Diagnosis not present

## 2021-02-08 DIAGNOSIS — M6281 Muscle weakness (generalized): Secondary | ICD-10-CM | POA: Diagnosis not present

## 2021-02-09 DIAGNOSIS — M6281 Muscle weakness (generalized): Secondary | ICD-10-CM | POA: Diagnosis not present

## 2021-02-10 DIAGNOSIS — M6281 Muscle weakness (generalized): Secondary | ICD-10-CM | POA: Diagnosis not present

## 2021-02-11 DIAGNOSIS — M6281 Muscle weakness (generalized): Secondary | ICD-10-CM | POA: Diagnosis not present

## 2021-02-13 DIAGNOSIS — M6281 Muscle weakness (generalized): Secondary | ICD-10-CM | POA: Diagnosis not present

## 2021-02-15 DIAGNOSIS — M6281 Muscle weakness (generalized): Secondary | ICD-10-CM | POA: Diagnosis not present

## 2021-02-16 DIAGNOSIS — M6281 Muscle weakness (generalized): Secondary | ICD-10-CM | POA: Diagnosis not present

## 2021-02-17 DIAGNOSIS — M6281 Muscle weakness (generalized): Secondary | ICD-10-CM | POA: Diagnosis not present

## 2021-02-18 DIAGNOSIS — M6281 Muscle weakness (generalized): Secondary | ICD-10-CM | POA: Diagnosis not present

## 2021-02-19 DIAGNOSIS — M6281 Muscle weakness (generalized): Secondary | ICD-10-CM | POA: Diagnosis not present

## 2021-03-18 DIAGNOSIS — F0151 Vascular dementia with behavioral disturbance: Secondary | ICD-10-CM | POA: Diagnosis not present

## 2021-03-18 DIAGNOSIS — I639 Cerebral infarction, unspecified: Secondary | ICD-10-CM | POA: Diagnosis not present

## 2021-03-18 DIAGNOSIS — I5030 Unspecified diastolic (congestive) heart failure: Secondary | ICD-10-CM | POA: Diagnosis not present

## 2021-03-18 DIAGNOSIS — R269 Unspecified abnormalities of gait and mobility: Secondary | ICD-10-CM | POA: Diagnosis not present

## 2021-04-22 DIAGNOSIS — R269 Unspecified abnormalities of gait and mobility: Secondary | ICD-10-CM | POA: Diagnosis not present

## 2021-04-22 DIAGNOSIS — I639 Cerebral infarction, unspecified: Secondary | ICD-10-CM | POA: Diagnosis not present

## 2021-04-22 DIAGNOSIS — F322 Major depressive disorder, single episode, severe without psychotic features: Secondary | ICD-10-CM | POA: Diagnosis not present

## 2021-04-22 DIAGNOSIS — I872 Venous insufficiency (chronic) (peripheral): Secondary | ICD-10-CM | POA: Diagnosis not present

## 2021-04-22 DIAGNOSIS — I5030 Unspecified diastolic (congestive) heart failure: Secondary | ICD-10-CM | POA: Diagnosis not present

## 2021-04-22 DIAGNOSIS — F0151 Vascular dementia with behavioral disturbance: Secondary | ICD-10-CM | POA: Diagnosis not present

## 2021-04-22 DIAGNOSIS — I2510 Atherosclerotic heart disease of native coronary artery without angina pectoris: Secondary | ICD-10-CM | POA: Diagnosis not present

## 2021-04-22 DIAGNOSIS — I10 Essential (primary) hypertension: Secondary | ICD-10-CM | POA: Diagnosis not present

## 2021-04-22 DIAGNOSIS — I2699 Other pulmonary embolism without acute cor pulmonale: Secondary | ICD-10-CM | POA: Diagnosis not present

## 2021-05-13 DIAGNOSIS — I5030 Unspecified diastolic (congestive) heart failure: Secondary | ICD-10-CM | POA: Diagnosis not present

## 2021-05-13 DIAGNOSIS — R269 Unspecified abnormalities of gait and mobility: Secondary | ICD-10-CM | POA: Diagnosis not present

## 2021-05-13 DIAGNOSIS — F0151 Vascular dementia with behavioral disturbance: Secondary | ICD-10-CM | POA: Diagnosis not present

## 2021-05-13 DIAGNOSIS — I639 Cerebral infarction, unspecified: Secondary | ICD-10-CM | POA: Diagnosis not present

## 2021-06-02 DIAGNOSIS — R41841 Cognitive communication deficit: Secondary | ICD-10-CM | POA: Diagnosis not present

## 2021-06-02 DIAGNOSIS — I6930 Unspecified sequelae of cerebral infarction: Secondary | ICD-10-CM | POA: Diagnosis not present

## 2021-06-03 DIAGNOSIS — R41841 Cognitive communication deficit: Secondary | ICD-10-CM | POA: Diagnosis not present

## 2021-06-03 DIAGNOSIS — I6930 Unspecified sequelae of cerebral infarction: Secondary | ICD-10-CM | POA: Diagnosis not present

## 2021-06-04 DIAGNOSIS — I6930 Unspecified sequelae of cerebral infarction: Secondary | ICD-10-CM | POA: Diagnosis not present

## 2021-06-04 DIAGNOSIS — R41841 Cognitive communication deficit: Secondary | ICD-10-CM | POA: Diagnosis not present

## 2023-10-10 NOTE — Progress Notes (Signed)
 This encounter was created in error - please disregard.

## 2024-04-27 ENCOUNTER — Emergency Department (HOSPITAL_COMMUNITY)
Admission: EM | Admit: 2024-04-27 | Discharge: 2024-04-27 | Disposition: A | Discharge status: Home / Self Care | Attending: Emergency Medicine | Admitting: Emergency Medicine

## 2024-04-27 DIAGNOSIS — I10 Essential (primary) hypertension: Secondary | ICD-10-CM | POA: Diagnosis not present

## 2024-04-27 DIAGNOSIS — S0990XA Unspecified injury of head, initial encounter: Secondary | ICD-10-CM | POA: Diagnosis present

## 2024-04-27 DIAGNOSIS — L7622 Postprocedural hemorrhage and hematoma of skin and subcutaneous tissue following other procedure: Secondary | ICD-10-CM | POA: Diagnosis not present

## 2024-04-27 DIAGNOSIS — X58XXXA Exposure to other specified factors, initial encounter: Secondary | ICD-10-CM | POA: Diagnosis not present

## 2024-04-27 DIAGNOSIS — Z79899 Other long term (current) drug therapy: Secondary | ICD-10-CM | POA: Insufficient documentation

## 2024-04-27 DIAGNOSIS — T148XXA Other injury of unspecified body region, initial encounter: Secondary | ICD-10-CM

## 2024-04-27 DIAGNOSIS — Z7901 Long term (current) use of anticoagulants: Secondary | ICD-10-CM | POA: Diagnosis not present

## 2024-04-27 DIAGNOSIS — S0101XA Laceration without foreign body of scalp, initial encounter: Secondary | ICD-10-CM | POA: Insufficient documentation

## 2024-04-27 DIAGNOSIS — Z7982 Long term (current) use of aspirin: Secondary | ICD-10-CM | POA: Insufficient documentation

## 2024-04-27 DIAGNOSIS — Z7902 Long term (current) use of antithrombotics/antiplatelets: Secondary | ICD-10-CM | POA: Insufficient documentation

## 2024-04-27 DIAGNOSIS — C4440 Unspecified malignant neoplasm of skin of scalp and neck: Secondary | ICD-10-CM | POA: Diagnosis not present

## 2024-04-27 MED ORDER — LIDOCAINE-EPINEPHRINE (PF) 2 %-1:200000 IJ SOLN
10.0000 mL | Freq: Once | INTRAMUSCULAR | Status: DC
  Filled 2024-04-27: qty 20

## 2024-04-27 NOTE — ED Triage Notes (Signed)
 BIB [facility] from [facility] for skin cancer lesions. Nurse Stated they Burst  and bleed through 2 pressure dressing on scalp. On Eliquis.

## 2024-04-27 NOTE — ED Provider Notes (Signed)
 [place] EMERGENCY DEPARTMENT AT [facility] Provider Note   CSN: [id number] Arrival date & time: 04/27/24  1740     Patient presents with: No chief complaint on file.   John Blackburn is a 78 y.o. male with past medical history significant for hypertension, previous pulmonary embolism, on Eliquis who presents with concern for bleeding from scalp.  Has skin cancer lesion on scalp, nurse at [facility] facility where patient lives reports that they burst open and started bleeding.  Bled through 2 pressure dressings prior to arrival.  Bleeding improved at time of my evaluation, although still oozing   HPI     Prior to Admission medications   Medication Sig Start Date End Date Taking? Authorizing Provider  amLODipine  (NORVASC ) 5 MG tablet Take 1 tablet (5 mg total) by mouth daily. 03/21/19 03/20/20  Jillian Buttery, MD  aspirin  EC 81 MG EC tablet Take 1 tablet (81 mg total) by mouth daily. 09/05/17   Henry Manuelita NOVAK, NP  atorvastatin  (LIPITOR ) 40 MG tablet Take 40 mg by mouth daily at 6 PM.    [provider]  cholecalciferol  (VITAMIN D ) 25 MCG tablet Take 1 tablet (1,000 Units total) by mouth daily. 09/08/19   Drusilla Sabas RAMAN, MD  clopidogrel  (PLAVIX ) 75 MG tablet Take 1 tablet (75 mg total) by mouth daily. Patient will be scheduled for AUGUST 2020. Patient taking differently: Take 75 mg by mouth daily.  12/07/18   Court Dorn PARAS, MD  escitalopram  (LEXAPRO ) 10 MG tablet Take 1 tablet by mouth once daily Patient taking differently: Take 10 mg by mouth daily.  11/01/18   Rollene Almarie LABOR, MD  guaiFENesin  (MUCINEX ) 600 MG 12 hr tablet Take 1 tablet (600 mg total) by mouth 2 (two) times daily. 09/07/19   Drusilla Sabas RAMAN, MD  lactose free nutrition (BOOST) LIQD Take 237 mLs by mouth 2 (two) times daily between meals.    [provider]  loratadine  (CLARITIN ) 10 MG tablet Take 1 tablet (10 mg total) by mouth daily as needed for allergies. 03/06/19   Will Almarie MATSU, MD  Melatonin 3 MG TABS Take 3 mg by mouth at bedtime.    [provider]  memantine  (NAMENDA  XR) 7 MG CP24 24 hr capsule TAKE 1  BY MOUTH ONCE DAILY Patient taking differently: Take 7 mg by mouth daily.  02/11/19   Rollene Almarie LABOR, MD  nitroGLYCERIN  (NITROSTAT ) 0.4 MG SL tablet Place 1 tablet (0.4 mg total) under the tongue every 5 (five) minutes as needed. Patient taking differently: Place 0.4 mg under the tongue every 5 (five) minutes x 2 doses as needed for chest pain.  09/04/17   Henry Manuelita NOVAK, NP  pantoprazole  (PROTONIX ) 40 MG tablet Take 1 tablet (40 mg total) by mouth daily. Patient will be scheduled for AUGUST 2020. Patient taking differently: Take 40 mg by mouth daily.  12/07/18   Court Dorn PARAS, MD  polyethylene glycol powder (GLYCOLAX/MIRALAX) 17 GM/SCOOP powder Take 17 g by mouth daily. MIX INTO 4-8 OUNCES OF LIQUID    [provider]  PRESCRIPTION MEDICATION See admin instructions. CPAP- At bedtime (remove each morning)    [provider]  senna-docusate (SENOKOT-S) 8.6-50 MG tablet Take 1 tablet by mouth 2 (two) times daily as needed for mild constipation. Patient taking differently: Take 1 tablet by mouth at bedtime.  03/21/19   Jillian Buttery, MD  vitamin B-12 (CYANOCOBALAMIN ) 1000 MCG tablet Take 1,000 mcg by mouth daily.  [provider]    Allergies: Cephalexin and Levofloxacin    Review of Systems  All other systems reviewed and are negative.   Updated Vital Signs BP 136/74 (BP Location: Right Arm)   Pulse 61   Temp 97.6 F (36.4 C) (Axillary)   Resp 18   SpO2 99%   Physical Exam Vitals and nursing note reviewed.  Constitutional:      General: He is not in acute distress.    Appearance: Normal appearance.  HENT:     Head: Normocephalic and atraumatic.  Eyes:     General:        Right eye: No discharge.        Left eye: No discharge.  Cardiovascular:     Rate and Rhythm: Normal rate and regular rhythm.   Pulmonary:     Effort: Pulmonary effort is normal. No respiratory distress.  Musculoskeletal:        General: No deformity.  Skin:    General: Skin is warm and dry.     Comments: Skin cancer lesion noted at apex of scalp with some oozing, large amount of blood and pressure dressing which was removed prior to evaluation.  Neurological:     Mental Status: He is alert and oriented to person, place, and time.  Psychiatric:        Mood and Affect: Mood normal.        Behavior: Behavior normal.     (all labs ordered are listed, but only abnormal results are displayed) Labs Reviewed - No data to display  EKG: None  Radiology: No results found.   .Laceration Repair  Date/Time: 04/27/2024 6:45 PM  Performed by: Rosan Sherlean DEL, PA-C Authorized by: Rosan Sherlean DEL, PA-C   Consent:    Consent obtained:  Verbal   Consent given by:  Patient   Risks, benefits, and alternatives were discussed: yes     Risks discussed:  Infection, pain, need for additional repair, poor cosmetic result and poor wound healing   Alternatives discussed:  No treatment Universal protocol:    Procedure explained and questions answered to patient or proxy's satisfaction: yes     Patient identity confirmed:  Verbally with patient Anesthesia:    Anesthesia method:  Local infiltration   Local anesthetic:  Lidocaine  2% WITH epi Laceration details:    Location:  Scalp   Scalp location:  Crown   Length (cm):  2.5 Exploration:    Hemostasis achieved with:  Epinephrine  Treatment:    Area cleansed with:  Povidone-iodine and saline Skin repair:    Repair method:  Sutures   Suture size:  4-0   Wound skin closure material used: Vicryl.   Suture technique:  Figure eight   Number of sutures:  1 Approximation:    Approximation:  Close Repair type:    Repair type:  Simple Post-procedure details:    Dressing:  Non-adherent dressing   Procedure completion:  Tolerated    Medications Ordered in the  ED  lidocaine -EPINEPHrine  (XYLOCAINE  W/EPI) 2 %-1:200000 (PF) injection 10 mL (has no administration in time range)                                    Medical Decision Making  This patient is a 78 y.o. male who presents to the ED for concern of bleeding laceration to scalp..   Differential diagnoses prior to evaluation: Laceration requiring repair,  Past Medical  History / Social History / Additional history: Chart reviewed. Pertinent results include:  hypertension, previous pulmonary embolism, on Eliquis  Physical Exam: Physical exam performed. The pertinent findings include: Skin cancer lesion noted at apex of scalp, actively oozing at time of evaluation  Medications / Treatment: I injected lidocaine  with epinephrine  at site of oozing scalp lesion, 1 figure-of-eight stitch was placed , and bleeding was controlled on reevaluation.   Disposition: After consideration of the diagnostic results and the patients response to treatment, I feel that Patient with bleeding controlled on reevaluation, scalp was cleaned, I think that he is stable for return to his facility at this time.   emergency department workup does not suggest an emergent condition requiring admission or immediate intervention beyond what has been performed at this time. The plan is: as above. The patient is safe for discharge and has been instructed to return immediately for worsening symptoms, change in symptoms or any other concerns.  Final diagnoses:  Bleeding from wound    ED Discharge Orders     None          Rosan Sherlean VEAR DEVONNA 04/27/24 1852    Cottie Donnice PARAS, MD 04/27/24 [phone]

## 2024-04-27 NOTE — Discharge Instructions (Addendum)
 Laceration was repaired with a figure-of-eight stitch, and the area was extensively cleaned, stable for return to facility, the suture that I placed is dissolvable, wound should be monitored for any signs of developing infection, drainage, redness.

## 2024-04-27 NOTE — ED Notes (Signed)
 [facility] called unable to give pick up time
